# Patient Record
Sex: Female | Born: 1935
Health system: Southern US, Community
[De-identification: ages and names within clinical notes are randomized; demographics above are authoritative.]

## PROBLEM LIST (undated history)

## (undated) ENCOUNTER — Emergency Department (HOSPITAL_COMMUNITY): Admission: EM | Discharge status: Absent without leave | Payer: Medicare Other

## (undated) DIAGNOSIS — G8929 Other chronic pain: Secondary | ICD-10-CM

## (undated) DIAGNOSIS — R51 Headache: Secondary | ICD-10-CM

## (undated) DIAGNOSIS — M542 Cervicalgia: Secondary | ICD-10-CM

## (undated) DIAGNOSIS — I10 Essential (primary) hypertension: Secondary | ICD-10-CM

## (undated) DIAGNOSIS — K219 Gastro-esophageal reflux disease without esophagitis: Secondary | ICD-10-CM

## (undated) DIAGNOSIS — J189 Pneumonia, unspecified organism: Secondary | ICD-10-CM

## (undated) DIAGNOSIS — F419 Anxiety disorder, unspecified: Secondary | ICD-10-CM

## (undated) DIAGNOSIS — M24829 Other specific joint derangements of unspecified elbow, not elsewhere classified: Secondary | ICD-10-CM

## (undated) DIAGNOSIS — N390 Urinary tract infection, site not specified: Secondary | ICD-10-CM

## (undated) DIAGNOSIS — E039 Hypothyroidism, unspecified: Secondary | ICD-10-CM

## (undated) DIAGNOSIS — M810 Age-related osteoporosis without current pathological fracture: Secondary | ICD-10-CM

## (undated) DIAGNOSIS — M549 Dorsalgia, unspecified: Secondary | ICD-10-CM

## (undated) DIAGNOSIS — M1990 Unspecified osteoarthritis, unspecified site: Secondary | ICD-10-CM

## (undated) DIAGNOSIS — R519 Headache, unspecified: Secondary | ICD-10-CM

## (undated) DIAGNOSIS — R0602 Shortness of breath: Secondary | ICD-10-CM

## (undated) DIAGNOSIS — E785 Hyperlipidemia, unspecified: Secondary | ICD-10-CM

## (undated) HISTORY — PX: CATARACT EXTRACTION: SUR2

## (undated) HISTORY — PX: OTHER SURGICAL HISTORY: SHX169

## (undated) HISTORY — PX: ELBOW SURGERY: SHX618

## (undated) HISTORY — PX: ESOPHAGOGASTRODUODENOSCOPY: SHX1529

## (undated) HISTORY — PX: COLONOSCOPY: SHX174

## (undated) HISTORY — PX: TOTAL HIP ARTHROPLASTY: SHX124

---

## 1996-01-08 HISTORY — PX: JOINT REPLACEMENT: SHX530

## 1998-04-24 ENCOUNTER — Other Ambulatory Visit: Admission: RE | Admit: 1998-04-24 | Discharge: 1998-04-24 | Payer: Self-pay | Admitting: Cardiology

## 1998-10-22 ENCOUNTER — Emergency Department (HOSPITAL_COMMUNITY): Admission: EM | Admit: 1998-10-22 | Discharge: 1998-10-22 | Payer: Self-pay | Admitting: *Deleted

## 1998-12-28 ENCOUNTER — Encounter: Admission: RE | Admit: 1998-12-28 | Discharge: 1998-12-28 | Payer: Self-pay | Admitting: Rheumatology

## 1998-12-28 ENCOUNTER — Encounter: Payer: Self-pay | Admitting: Rheumatology

## 2000-01-11 ENCOUNTER — Inpatient Hospital Stay (HOSPITAL_COMMUNITY): Admission: EM | Admit: 2000-01-11 | Discharge: 2000-01-16 | Payer: Self-pay | Admitting: Emergency Medicine

## 2000-01-11 ENCOUNTER — Encounter: Payer: Self-pay | Admitting: Emergency Medicine

## 2000-02-12 ENCOUNTER — Encounter: Payer: Self-pay | Admitting: Internal Medicine

## 2000-02-12 ENCOUNTER — Encounter: Admission: RE | Admit: 2000-02-12 | Discharge: 2000-02-12 | Payer: Self-pay | Admitting: Internal Medicine

## 2000-06-06 ENCOUNTER — Encounter: Admission: RE | Admit: 2000-06-06 | Discharge: 2000-06-06 | Payer: Self-pay | Admitting: Internal Medicine

## 2000-06-06 ENCOUNTER — Encounter: Payer: Self-pay | Admitting: Internal Medicine

## 2000-06-09 ENCOUNTER — Encounter: Payer: Self-pay | Admitting: Internal Medicine

## 2000-06-09 ENCOUNTER — Encounter: Admission: RE | Admit: 2000-06-09 | Discharge: 2000-06-09 | Payer: Self-pay | Admitting: Internal Medicine

## 2000-06-15 ENCOUNTER — Emergency Department (HOSPITAL_COMMUNITY): Admission: EM | Admit: 2000-06-15 | Discharge: 2000-06-15 | Payer: Self-pay | Admitting: Emergency Medicine

## 2000-07-21 ENCOUNTER — Encounter: Payer: Self-pay | Admitting: Internal Medicine

## 2000-07-21 ENCOUNTER — Encounter: Admission: RE | Admit: 2000-07-21 | Discharge: 2000-07-21 | Payer: Self-pay | Admitting: Internal Medicine

## 2001-02-12 ENCOUNTER — Emergency Department (HOSPITAL_COMMUNITY): Admission: EM | Admit: 2001-02-12 | Discharge: 2001-02-12 | Payer: Self-pay | Admitting: Emergency Medicine

## 2001-02-12 ENCOUNTER — Encounter: Payer: Self-pay | Admitting: Emergency Medicine

## 2001-05-11 ENCOUNTER — Encounter: Payer: Self-pay | Admitting: Internal Medicine

## 2001-05-11 ENCOUNTER — Encounter: Admission: RE | Admit: 2001-05-11 | Discharge: 2001-05-11 | Payer: Self-pay | Admitting: Internal Medicine

## 2001-05-21 ENCOUNTER — Other Ambulatory Visit: Admission: RE | Admit: 2001-05-21 | Discharge: 2001-05-21 | Payer: Self-pay | Admitting: Obstetrics and Gynecology

## 2001-07-01 ENCOUNTER — Encounter (INDEPENDENT_AMBULATORY_CARE_PROVIDER_SITE_OTHER): Payer: Self-pay

## 2001-07-01 ENCOUNTER — Ambulatory Visit (HOSPITAL_COMMUNITY): Admission: RE | Admit: 2001-07-01 | Discharge: 2001-07-01 | Payer: Self-pay | Admitting: Gastroenterology

## 2002-11-03 ENCOUNTER — Encounter: Admission: RE | Admit: 2002-11-03 | Discharge: 2002-11-03 | Payer: Self-pay | Admitting: Internal Medicine

## 2003-02-11 ENCOUNTER — Inpatient Hospital Stay (HOSPITAL_COMMUNITY): Admission: EM | Admit: 2003-02-11 | Discharge: 2003-02-16 | Payer: Self-pay | Admitting: Emergency Medicine

## 2004-01-07 ENCOUNTER — Encounter: Admission: RE | Admit: 2004-01-07 | Discharge: 2004-01-07 | Payer: Self-pay | Admitting: Orthopedic Surgery

## 2005-03-13 ENCOUNTER — Encounter: Admission: RE | Admit: 2005-03-13 | Discharge: 2005-03-13 | Payer: Self-pay | Admitting: Orthopedic Surgery

## 2005-03-30 ENCOUNTER — Emergency Department (HOSPITAL_COMMUNITY): Admission: EM | Admit: 2005-03-30 | Discharge: 2005-03-30 | Payer: Self-pay | Admitting: Emergency Medicine

## 2005-05-08 ENCOUNTER — Ambulatory Visit: Payer: Self-pay | Admitting: Critical Care Medicine

## 2005-05-10 ENCOUNTER — Ambulatory Visit: Payer: Self-pay | Admitting: Critical Care Medicine

## 2005-05-23 ENCOUNTER — Inpatient Hospital Stay (HOSPITAL_COMMUNITY): Admission: RE | Admit: 2005-05-23 | Discharge: 2005-05-25 | Payer: Self-pay | Admitting: Orthopedic Surgery

## 2005-09-13 ENCOUNTER — Encounter: Admission: RE | Admit: 2005-09-13 | Discharge: 2005-09-13 | Payer: Self-pay | Admitting: Internal Medicine

## 2006-04-16 ENCOUNTER — Encounter: Admission: RE | Admit: 2006-04-16 | Discharge: 2006-04-16 | Payer: Self-pay | Admitting: Internal Medicine

## 2006-10-10 ENCOUNTER — Ambulatory Visit (HOSPITAL_COMMUNITY): Admission: RE | Admit: 2006-10-10 | Discharge: 2006-10-10 | Payer: Self-pay | Admitting: Obstetrics and Gynecology

## 2007-01-14 ENCOUNTER — Encounter (INDEPENDENT_AMBULATORY_CARE_PROVIDER_SITE_OTHER): Payer: Self-pay | Admitting: Internal Medicine

## 2007-01-14 ENCOUNTER — Observation Stay (HOSPITAL_COMMUNITY): Admission: EM | Admit: 2007-01-14 | Discharge: 2007-01-15 | Payer: Self-pay | Admitting: Emergency Medicine

## 2007-01-15 ENCOUNTER — Encounter (INDEPENDENT_AMBULATORY_CARE_PROVIDER_SITE_OTHER): Payer: Self-pay | Admitting: Internal Medicine

## 2007-10-13 ENCOUNTER — Encounter (INDEPENDENT_AMBULATORY_CARE_PROVIDER_SITE_OTHER): Payer: Self-pay | Admitting: Gastroenterology

## 2007-10-13 ENCOUNTER — Ambulatory Visit (HOSPITAL_COMMUNITY): Admission: RE | Admit: 2007-10-13 | Discharge: 2007-10-13 | Payer: Self-pay | Admitting: Gastroenterology

## 2007-11-25 ENCOUNTER — Emergency Department (HOSPITAL_COMMUNITY): Admission: EM | Admit: 2007-11-25 | Discharge: 2007-11-25 | Payer: Self-pay | Admitting: Emergency Medicine

## 2008-08-06 ENCOUNTER — Emergency Department (HOSPITAL_BASED_OUTPATIENT_CLINIC_OR_DEPARTMENT_OTHER): Admission: EM | Admit: 2008-08-06 | Discharge: 2008-08-06 | Payer: Self-pay | Admitting: Emergency Medicine

## 2009-06-06 ENCOUNTER — Encounter: Admission: RE | Admit: 2009-06-06 | Discharge: 2009-06-06 | Payer: Self-pay | Admitting: Internal Medicine

## 2009-10-20 ENCOUNTER — Emergency Department (HOSPITAL_BASED_OUTPATIENT_CLINIC_OR_DEPARTMENT_OTHER): Admission: EM | Admit: 2009-10-20 | Discharge: 2009-10-20 | Payer: Self-pay | Admitting: Emergency Medicine

## 2009-10-20 ENCOUNTER — Ambulatory Visit: Payer: Self-pay | Admitting: Diagnostic Radiology

## 2010-05-22 ENCOUNTER — Other Ambulatory Visit: Payer: Self-pay | Admitting: Dermatology

## 2010-05-22 NOTE — Op Note (Signed)
Jocelyn Bauer, Jocelyn Bauer               ACCOUNT NO.:  [id number]   MEDICAL RECORD NO.:  [id number]          PATIENT TYPE:  AMB   LOCATION:  ENDO                         FACILITY:  [facility]   PHYSICIAN:  John C. Madilyn Fireman, M.D.    DATE OF BIRTH:  24-Feb-1928   DATE OF PROCEDURE:  10/13/2007  DATE OF DISCHARGE:                               OPERATIVE REPORT   INDICATIONS FOR PROCEDURE:  Heme-positive stools.   PROCEDURE:  The patient was placed in the left lateral decubitus  position and was placed on the pulse monitor with continuous low flow  oxygen delivered by nasal cannula.  She was sedated with 50 mcg IV  fentanyl and 4 mg IV Versed in addition to the medication given for the  previous EGD.  The Olympus video colonoscope was inserted into the  rectum and advanced to the cecum, confirmed by transillumination at  McBurney's point and visualization of the ileocecal valve and  appendiceal orifice.  Prep is excellent.  The cecum, ascending,  transverse, descending, and sigmoid colon all appeared normal with no  masses, polyps, diverticula, or other mucosal abnormalities.  The rectum  likewise appeared normal, and retroflexed view of the anus revealed no  obvious internal hemorrhoids.  The scope was then withdrawn, and the  patient returned to the recovery room in stable condition.  She  tolerated the procedure well, and there were no immediate complications.   IMPRESSION:  Normal colonoscopy.   PLAN:  Repeat colonoscopy in 5-10 years in face of Hemoccult positivity,  I recommend repeating Hemoccult in five years.           ______________________________  [name], M.D.     JCH/MEDQ  D:  10/13/2007  T:  10/13/2007  Job:  [id number]   cc:   Dr. __________

## 2010-05-22 NOTE — Consult Note (Signed)
Jocelyn Bauer, Jocelyn Bauer               ACCOUNT NO.:  [id number]   MEDICAL RECORD NO.:  [id number]          PATIENT TYPE:  INP   LOCATION:  3731                         FACILITY:  [facility]   PHYSICIAN:  Casimiro Needle L. Reynolds, M.D.DATE OF BIRTH:  1928/11/18   DATE OF CONSULTATION:  01/14/2007  DATE OF DISCHARGE:                                 CONSULTATION   REASON FOR EVALUATION:  Transient ischemic attack.   HISTORY OF PRESENT ILLNESS:  This is the initial inpatient consultation  evaluation of this 77 year old woman with a past medical history which  includes hypertension, hyperlipidemia and rheumatoid arthritis.  The  patient reports that she went to bed last night feeling well, and  actually woke up around midnight and had no problems.  However, when she  awoke up around 5:30 a.m., she did not feel well.  She says that the  right side of her face felt funny, as if it were drawn.  There was as  little bit of a tingling sensation.  She also felt her speech was  somewhat slurred when she tried to talk to her daughter.  She checked  her blood pressure and it was elevated into the 170/95 range.  She took  aspirin and some medications, subsequently began to feel better.  She  was brought to emergency room for further evaluation, subsequently  admitted for TIA workup, a neurologic consultation is requested.  She  denies ever having any similar events to this before and is not aware of  any previous history of stroke.   PAST MEDICAL HISTORY:  1. Hypertension and hyperlipidemia for which she is on medications,      although she admits that she has difficulty taking statins because      they tend to make her joints hurt.  2. History of rheumatoid arthritis with several associated joint      replacement surgeries including elbow and hip replacements.  3. History of hypothyroidism for which she is on replacement.   Family/Social/Review of Systems per admission H&P and admission ER  records.   HOME MEDICATIONS:  Synthroid, folate, methotrexate, calcium with vitamin  D, Vicodin 2-3 a day, stool softener, Fosamax, Protonix, benazepril,  Pravachol, amlodipine.   PHYSICAL EXAMINATION:  VITAL SIGNS:  Temperature 99.7, blood pressure  146/71, pulse 66, respirations 16, O2 sat 98% on room air.  GENERAL:  This is a healthy-appearing woman, supine in the hospital bed,  in no evident distress.  HEENT:  Cranium is normocephalic, atraumatic.  Oropharynx benign.  NECK:  Supple without carotid or supraclavicular bruits.  HEART:  Regular rate and rhythm without murmurs.  NEUROLOGIC:  Mental status:  She is awake and alert.  She is fully  oriented to time, place and person.  Recent and remote memory are  intact.  Attention span, concentration and fund of knowledge are all  appropriate.  Speech is fluent and not dysarthric.  She is able to name  objects, but has difficulty repeating a complex phrase.  Cranial nerves:  Pupils are equal, reactive.  Extraocular movements are full without  nystagmus.  Visual fields are full  to confrontation.  Hearing is intact  to conversational speech.  Face, tongue and palate move normally and  symmetrically.  Facial sensation is intact pinprick.  Shoulder shrug is  normal.  Motor testing normal bulk and tone, normal strength in all  extremity muscles.  Sensation intact to light touch, pinprick and double  stimulation in all extremities.  Coordination:  Rapid movements were  performed accurately.  Finger-nose was performed accurately.  Gait:  She  arises slowly from a chair.  Her stance is normal.  She is able to  ambulate without difficulty.  Reflexes 2+ and symmetric.  Toes are  downgoing bilaterally.   LABORATORY DATA:  CBC white count 6.2, hemoglobin 13.2, platelets  378,000 with a normal differential.  Chemistries were unremarkable.  Creatinine 0.7.  Urinalysis remarkable for trace leukocytes without  evidence of infection.  CT of the head performed  earlier today is  personally reviewed.  The study shows some very mild atrophy with no  evidence of acute finding, and really no significant evidence of white  matter disease or other vascular disease.  There does appear to be some  calcification in the left vetebral artery near the foramen magnum.   IMPRESSION:  Left brain transient ischemic attack, probably small vessel  subcortical in character without evident clinical residual.  Initially  presenting with right face weakness/numbness and dysarthria.  Risk  factors include hypertension and hypercholesterolemia.   RECOMMENDATIONS:  I would proceed with a routine stroke workup,  including MRI, MRA, carotid and transcranial Dopplers, 2-D  echocardiogram, and stroke labs.  Agree with aspirin daily.  Stroke  service to follow.  If the workup is unremarkable, she maybe discharged  tomorrow on aspirin.      Michael L. Thad Ranger, M.D.  Electronically Signed     MLR/MEDQ  D:  01/14/2007  T:  01/14/2007  Job:  [id number]

## 2010-05-22 NOTE — Op Note (Signed)
Jocelyn Bauer, Jocelyn Bauer               ACCOUNT NO.:  [id number]   MEDICAL RECORD NO.:  [id number]          PATIENT TYPE:  AMB   LOCATION:  ENDO                         FACILITY:  [facility]   PHYSICIAN:  John C. Madilyn Fireman, M.D.    DATE OF BIRTH:  1928/09/12   DATE OF PROCEDURE:  10/13/2007  DATE OF DISCHARGE:                               OPERATIVE REPORT   PROCEDURE:  Esophagogastroduodenoscopy with biopsy.   INDICATIONS FOR PROCEDURE:  Heme-positive stools.   DESCRIPTION OF PROCEDURE:  The patient was placed in the left lateral  decubitus position and placed on the pulse monitor with continuous low-  flow oxygen delivered by nasal cannula.  She was sedated with 50 mcg of  IV fentanyl and 5 mg of IV Versed.  The Olympus video endoscope was  advanced under direct vision into the oropharynx and esophagus.  The  esophagus was straight and of normal caliber.  The squamocolumnar line  at 36 cm above a 2-cm hiatal hernia.  There was a possible very faint  widely patent lower esophageal ring seen only with the LES was fully  relaxed.  The stomach was entered and a small amount of liquid  secretions were suctioned from the fundus.  Retroflexed view of the  cardia confirmed a hiatal hernia and was otherwise unremarkable.  Within  the body of the stomach, there were seen a few scattered small polyps,  typical in appearance for benign fundic gland polyps.  Two of these were  sampled for histology.  The antrum showed some streaks of erythema with  mild granularity with no definite focal erosions.  A CLO-test was  obtained.  The pylorus was not deformed and easily allowed passage of  the endoscope tip into the duodenum.  Both the bulb and second were well  inspected and appeared be within normal limits.  The scope was then  withdrawn and the patient returned to the recovery room in stable  condition.  She tolerated the procedure well and there were no immediate  complications.   IMPRESSION:  1. Hiatal  hernia.  2. Gastric polyps.   PLAN:  Will await CLO-test and biopsy results.           ______________________________  [name], M.D.     JCH/MEDQ  D:  10/13/2007  T:  10/13/2007  Job:  [id number]   cc:   Dr. Thurmon Fair

## 2010-05-22 NOTE — H&P (Signed)
NAMEPAITYNN, MIKUS               ACCOUNT NO.:  [id number]   MEDICAL RECORD NO.:  [id number]          PATIENT TYPE:  INP   LOCATION:  3731                         FACILITY:  [facility]   PHYSICIAN:  Mark A. Perini, M.D.   DATE OF BIRTH:  Jan 27, 1928   DATE OF ADMISSION:  01/14/2007  DATE OF DISCHARGE:                              HISTORY & PHYSICAL   CHIEF COMPLAINT:  Right facial numbness.   HISTORY OF PRESENT ILLNESS:  Jocelyn Bauer is a pleasant 77 year old female  with a past medical history as listed below, who woke this morning and  noticed that she had a numbness across the right side of her face,  including her forehead and cheek.  She also felt as though her right  face was drooping and that she was having some trouble speaking.  Her  daughter was with her, and did feel as though she had some slurred  speech as well.  She presented to the emergency room.  Her symptoms  lasted for a total of approximately 30 minutes.  A cranial CT scan is  negative.  At home she checked her blood pressure and it was 170/100.  It has since come down to 140/70; however, given her symptoms, she will  be admitted for further evaluation.   PAST MEDICAL HISTORY:  1. Rheumatoid arthritis since 1974.  2. History of bilateral elbow replacements.  3. History of left hip replacement.  4. Hypothyroidism.  5. Appendectomy.  6. Her uterus has been tacked.  7. Thyroidectomy in 1968.  8. Mild gastroesophageal reflux disease.  9. Admitted for pneumonia in January 2002.  10.History of chronic low back pain.  11.Degenerative disk disease.  12.Cervical spine degenerative joint disease.  13.Osteoporosis.  14.Adrenal insufficiency in 2003, from long-term prednisone; however,      this has clinically resolved and she has not required long-term      steroids.  15.In May 2004, a simple cyst of the left ovary, which has required      some followup.  16.Uterine fibroid.  17.Hypertension since 2004.  18.Possible COBT  noted in 2007.  19.Hyperlipidemia.  20.She bruised her left hip after a fall in 2005.   ALLERGIES:  DILAUDID causes her to feel hot all over.  DIDRONEL caused  diarrhea.  IV DYE caused a rash.  MS CONTIN caused itching.  VYTORIN  caused achiness.   CURRENT MEDICATIONS:  1. Synthroid 100 mcg daily.  2. Folic acid 1 mg daily.  3. Methotrexate 2.5 mg, three pills weekly.  4. Calcium with D twice daily.  5. Vicodin, one pill up to three or four times daily.  6. Metamucil p.r.n.  7. Stool softener p.r.n.  8. Protonix 40 mg daily.  9. Fosamax 70 mg weekly.  10.Lotensin 20 mg daily.  11.Vitamin D 50,000 units twice weekly.  12.Amlodipine 2.5 mg daily.  13.Pravachol 40 mg each evening for the last few months; however, she      stopped the Pravachol one week ago, because she felt it was causing      her to ache more.   PHYSICAL EXAMINATION:  VITAL  SIGNS:  Temperature 99.7 degrees, pulse 66,  respirations 16.  She is in normal sinus rhythm on telemetry.  Blood  pressure 146/71.  GENERAL:  She is in no acute distress.  She is sitting on the side of  the bed.  She is alert and oriented x4.  She is normally conversant and  speech is fluent.  NEUROLOGIC:  Cranial nerves II-XII  are normal at this time.  LUNGS:  Clear to auscultation bilaterally.  There are no wheezes, rales  or rhonchi.  HEART:  A regular rate and rhythm, with no murmur, rub or gallop.  ABDOMEN:  Soft, nontender.  There is no edema.  EXTREMITIES:  She moves her extremities x4.   REVIEW OF SYSTEMS:  As per the history of present illness.  She has had  chronic back pain.  She denies any other stroke symptoms.  Specifically,  she denies blindness, diplopia or weakness of the extremities, which  localizes.   LABORATORY DATA:  A cranial CT scan and chest x-ray have been personally  reviewed and are negative.   White count 6.2 with a normal differential, hemoglobin 13.2, platelet  count 378,000.  Sodium 140, potassium 4,  chloride 109, CO2 of 25, BUN  12, creatinine 0.7, glucose 107.   ASSESSMENT/PLAN:  This 77 year old female with multiple stroke risk  factors, with a left brain transient ischemic attack:  Will admit.  Will  obtain a neurology consultation.  Will check an MRI/MRA of the brain, as  well as carotid Dopplers and an echocardiogram.  Will add a 325 mg  aspirin daily.  She is a full-code status.      Mark A. Perini, M.D.  Electronically Signed     MAP/MEDQ  D:  01/14/2007  T:  01/14/2007  Job:  [id number]

## 2010-05-22 NOTE — Discharge Summary (Signed)
NAMELUCILL, Jocelyn Bauer               ACCOUNT NO.:  [id number]   MEDICAL RECORD NO.:  [id number]          PATIENT TYPE:  INP   LOCATION:  3731                         FACILITY:  [facility]   PHYSICIAN:  Mark A. Perini, M.D.   DATE OF BIRTH:  [birth year]   DATE OF ADMISSION:  01/14/2007  DATE OF DISCHARGE:  01/15/2007                               DISCHARGE SUMMARY   DISCHARGE DIAGNOSES:  1. Left brain transient ischemic attack.  2. Rheumatoid arthritis.  3. Hypertension.  4. Hypothyroidism.  5. Gastroesophageal reflux.  6. Chronic low back pain with degenerative disk disease of the lumbar      and cervical spine and degenerative joint disease.  7. Osteoporosis.  8. Hyperlipidemia.  9. Hypertension.   PROCEDURES:  Neurology consultation.  Carotid Dopplers, which showed no  significant plaque bilaterally.  No significant internal carotid artery  stenosis.  Right vertebral artery was not visualized.  Left vertebral  artery flow was antegrade.  MRI of the brain and MRA showed no acute  pathology.  Echocardiogram 2-dimensional showed ejection fraction of 55%-  60% and mild hypokinesis of the mid inferior septal wall.  There was  abnormal left ventricular relaxation, but no source of cardiac embolism  was noted.  Transcranial Dopplers are pending at the time of this  discharge.   DISCHARGE MEDICATIONS:  Jocelyn Bauer will resume all of her same medications,  with the exception that she will take a noncoated 325-mg aspirin daily  henceforth.  Her other medications are:  1. Synthroid 100 mcg daily.  2. Folic acid 1 mg daily.  3. Methotrexate 2.5 mg 3 pills weekly.  4. Calcium with D twice a day.  5. Vicodin 1 pill up to 4 times daily as needed.  6. Metamucil as needed.  7. Stool softener as needed.  8. Protonix 40 mg daily with food.  9. Fosamax 70 mg weekly.  10.Lotensin 20 mg daily.  11.Vitamin D 50,000 units twice a week.  12.Amlodipine 2.5 mg daily.  13.Pravachol 40 mg each evening.   HISTORY OF PRESENT ILLNESS:  Jocelyn Bauer is a pleasant, 77 year old female  who presented with right facial numbness and some drooping of her right  face and some trouble speaking.  These symptoms lasted approximately 30  minutes.  She was admitted for further evaluation.   HOSPITAL COURSE:  Jocelyn Bauer was admitted to a telemetry bed.  She remained  in normal sinus rhythm.  She remained stable from a neurologic and  cardiovascular and pulmonary standpoint.  She underwent the diagnostic  studies as noted above.  She did not have any recurrence of stroke-type  symptoms.  By the evening of January 15, 2007, she was deemed stable for  discharge home.   DISCHARGE PHYSICAL EXAMINATION:  VITAL SIGNS:  Temperature 98.5,  afebrile, pulse 85, respiratory rate 16, blood pressure 116/67.  GENERAL:  She is in no acute distress.  NEURO:  Speech was fluent.  Cranial nerves were intact.  LUNGS:  Clear to auscultation bilaterally with no wheezes, rales, or  rhonchi.  HEART:  Regular rate and rhythm with no murmur, rub, or gallop.  EXTREMITIES:  There is no edema.   NOTABLE DISCHARGE LABORATORY DATA:  White count 5.2, hemoglobin 12.4,  platelet count 361,000.  Sodium 141, potassium 3.7, chloride 106, CO2 of  27, BUN 10, creatinine 0.75, glucose 101.  Total cholesterol 181,  triglycerides 83, HDL 55, and LDL 109.  Homocysteine was 10.6  micromoles/L, which is normal.  Hemoglobin A1c was 5.7.  Urine culture  showed 15,000 colonies of multiple bacterial morphotypes.   DISCHARGE INSTRUCTIONS:  Jocelyn Bauer is to follow a low-salt diet.  She is to  increase her activity slowly.  She is to call if she has any recurrent  problems.  She is to call our office for a followup visit in 1 month.  Given her possible subtle wall motion abnormalities seen on her 2-D  echocardiogram, we will consider a followup stress test of her heart for  possible future risk stratification, although she has had no cardiac-  type symptoms  recently.      Mark A. Perini, M.D.  Electronically Signed     MAP/MEDQ  D:  01/15/2007  T:  01/16/2007  Job:  [id number]

## 2010-05-25 NOTE — Discharge Summary (Signed)
[facility]  Patient:    Jocelyn Bauer, Jocelyn Bauer                      MRN: [id number] Adm. Date:  [id number] Disc. Date: [id number] Attending:  Beatris Ship CC:         Demetria Pore. Coral Spikes, M.D.   Discharge Summary  DISCHARGE DIAGNOSES:  1. Community-acquired pneumonia versus bacterial bronchitis in an     immunosuppressed patient.  2. Rheumatoid arthritis, on chronic steroids and methotrexate.  3. Constipation.  4. Hypothyroidism.  DISCHARGE MEDICATIONS:  1. Humibid L.A. 600 mg 2 tablets p.o. b.i.d.  2. Synthroid 100 mcg q.d. as before.  3. Folic acid 1 mg q.d.  4. Senokot-S 2 tablets q.h.s.  5. Pepcid 20 mg q.d. for the next two weeks, then stop.  6. Combivent inhaler 2 puffs q.6h. and p.r.n.  7. Prednisone taper.  8. Tequin 400 mg q.d. for six further days.  9. Tussionex 5-10 ml every eight hours p.r.n. 10. Darvocet-N 100 p.r.n. as before. 11. Methotrexate resume previous dose.  PROCEDURES:  None.  HISTORY OF PRESENT ILLNESS:  Ms. Jocelyn Bauer is a 77 year old female with a history of steroid-dependent rheumatoid arthritis, hypothyroidism, who presented with cough and shortness of breath.  Over the prior 48 hours she developed progressive cough with some purulent sputum.  Furthermore, she had some increasing shortness of breath and presented to the emergency room, where she was found to be hypoxic.  She denied fevers.  Did feel somewhat nauseated but had no vomiting.  She was admitted for further management.  HOSPITAL COURSE:  Ms. Jocelyn Bauer was thought to have an acute bronchitis or early possible left lower lobe pneumonia by exam although radiographically negative. She was placed on Tequin antibiotic therapy.  She also was placed on a nebulizer treatment.  Furthermore, she was given stress-dose steroids.  She developed some constipation which was treated with Senokot, Dulcolax, and enemas, with good results.  She showed somewhat slow improvement  but, gradually, by January 15, 2000, was feeling somewhat better and, by January 16, 2000, was deemed stable for discharge home.  DISCHARGE PHYSICAL EXAMINATION:  VITAL SIGNS:  Temperature 98.1, pulse 86, blood pressure 120/64, 91-95% saturations on room air.  GENERAL:  She was standing in the hall.  She was in no acute distress.  LUNGS:  Revealed some better air movement, but she still had bilateral rhonchi on exam.  HEART:  Regular rate and rhythm with no murmur, rub, or gallop.  ABDOMEN:  Soft and nontender.  No edema.  Blood cultures remained negative.  DISCHARGE LABORATORY DATA:  ABG on admission showed a pH of 7.42, pCO2 of 39, pO2 of 55, bicarbonate of 25, oxygen saturation of 89% on 2 L nasal cannula oxygen.  On January 14, 2000, white count was 13.7, hemoglobin 10.9, MCV was 89, platelet count 439,000.  On January 14, 2000, sodium was 139, potassium 4.2, chloride 105, CO2 28, glucose 218, BUN 23, creatinine 0.9, calcium 8.7. Cardiac enzymes were normal.  Blood cultures remained negative.  Sputum culture revealed normal oropharyngeal flora.  DISCHARGE INSTRUCTIONS:  She is to be up as tolerated.  She is to follow a regular diet.  She is to call if she has any recurrent problems.  FOLLOW-UP:  She is to call [phone] for a follow-up appointment in the next three weeks with Dr. Waynard Edwards.  She is to follow up as she would previously with Dr. Coral Spikes. DD:  01/18/00 TD:  01/19/00 Job: [id number]

## 2010-05-25 NOTE — Discharge Summary (Signed)
NAME:  Jocelyn Bauer, Jocelyn Bauer                         ACCOUNT NO.:  [id number]   MEDICAL RECORD NO.:  [id number]                   PATIENT TYPE:  INP   LOCATION:  0469                                 FACILITY:  [facility]   PHYSICIAN:  Jene Every, M.D.                 DATE OF BIRTH:  Apr 24, 1928   DATE OF ADMISSION:  02/11/2003  DATE OF DISCHARGE:  02/16/2003                                 DISCHARGE SUMMARY   ADMISSION DIAGNOSES INCLUDE:  1. Nondisplaced pubic rami fracture with question of periprosthetic fracture     on the left.  2. Rheumatoid arthritis.  3. Hypothyroidism.  4. Gastroesophageal reflux disease.  5. Degenerative disk disease of lumbar spine.  6. Osteoporosis.  7. Hypertension.   DISCHARGE DIAGNOSES:  1. Nondisplaced pubic rami fracture with question of periprosthetic fracture     on the left.  2. Rheumatoid arthritis.  3. Hypothyroidism.  4. Gastroesophageal reflux disease.  5. Degenerative disk disease of lumbar spine.  6. Osteoporosis.  7. Hypertension.   CONSULTATIONS:  1. PT/OT.  2. Dr. Waynard Edwards  3. Dr. Darrelyn Hillock   HISTORY:  Jocelyn Bauer is a 77 year old female who presented to the ER after a  fall at home where she hit her head as well as her hip.  She presented to  the ER with complaints of left-sided pain.  Initial x-rays revealed a  nondisplaced pubic rami fracture with questionable periprosthetic left hip  fracture, this implant was  placed there by Dr. Darrelyn Hillock many years ago.  Patient was subsequently admitted to the hospital for pain control as well  as further evaluation of the hip.  A bone scan was ordered to rule out  periprosthetic hip fracture.  Dr. Waynard Edwards was consulted to address medical  issues during the patient's hospital stay.   LABORATORY VALUES:  H&H was checked prior to admission with hemoglobin 12.7  and hematocrit 37.9; repeat H&H hemoglobin 12.2, hematocrit 36.9.  PT/INR  showed a PT of 12.3, INR 0.9.  Recent chemistries done prior to  admission  showed sodium 138, potassium 3.7, glucose 105.  TSH ordered during her  hospitalization shows a level of 4.9.  Blood type A positive.  EKG done at  the time of admission shows normal sinus rhythm with nonspecific ST and T  wave abnormalities which is slightly improved compared to the last study.  Films taken of the hip and pelvis show a fracture of the left inferior pubic  rami with questionable proximal focal femoral deficiency.  Patient had a  normal CT of the brain.  Chest x-ray showed no active disease.   HOSPITAL COURSE:  The patient was admitted for pain control issues.  She was  started on Lovenox for DVT prophylaxis.  Dr. Waynard Edwards as well as Dr. Darrelyn Hillock  was consulted.  Dr. Darrelyn Hillock has followed the patient in regards to the  questionable periprosthetic hip fracture.  Patient did well  throughout her  hospitalization.  Medical issues were managed by Dr. Waynard Edwards accordingly;  this was medicine adjustments.  Bone scan revealed no activity, ruled out  left periprosthetic fracture but there however, was questionable loosening.  The patient was doing well with PT/OT for gait training as well as  transfers.  Discharge planning was initiated.  IVs and Foleys were  discontinued, patient had __________ the fracture due to the fact __________  Lovenox was continued.  By day #5 of admission it was felt the patient was  doing well enough to be discharged home with home health arrangements made.   DISCHARGE INSTRUCTIONS:  PT/OT was ordered for home.  Patient will be  weightbearing as tolerated with a walker.  She will continue to use her TED  stockings for DVT prophylaxis as well as a daily aspirin.  Patient is to  make an appointment with Dr. Shelle Bauer in approximately 10-14 days for  evaluation as well as with Dr. Darrelyn Hillock for evaluation of her hip.  Appointment will also be made with Dr. Waynard Edwards for medical issues.  All home  meds were adjusted by Dr. Waynard Edwards at time of discharge.  Diet is  as  tolerated.  Condition on discharge stable.   FINAL DIAGNOSIS:  Doing well with a nondisplaced pubic rami fracture.     Jocelyn Bauer, P.A.                   Jene Every, M.D.    CS/MEDQ  D:  03/16/2003  T:  03/17/2003  Job:  [id number]

## 2010-05-25 NOTE — Procedures (Signed)
[facility]  Patient:    Jocelyn Bauer, Jocelyn Bauer Visit Number: [id number] MRN: [id number]          Service Type: END Location: ENDO Attending Physician:  Rich Brave Dictated by:   Florencia Reasons, M.D. Proc. Date: 07/01/01 Admit Date:  07/01/2001   CC:         Rodrigo Ran, M.D.  Richard M. Marcelle Overlie, M.D.   Procedure Report  PROCEDURE:  Colonoscopy with biopsies.  INDICATION:  A 77 year old female with rheumatoid arthritis and longstanding right lower quadrant abdominal pain which has gone on for many years, with a previous barium enema showing that the cecum is on a long mesentery.  More recently, she has been found to have an apparent uterine fibroid and a simple ovarian cyst, and apparently consideration is being given to possible hysterectomy by her gynecologist, Dr. Mancel Bale.  The patient is also in need of colon cancer screening and, more recently, has had a tendency toward some mild diarrhea which is a new development.  FINDINGS:  Normal exam to the cecum.  DESCRIPTION OF PROCEDURE:  The nature, purpose, and risks of the procedure had been discussed with the patient, who provided written consent.  Sedation was fentanyl 100 mcg and Versed 10 mg IV to a level of mild to moderate sedation. The large amount of medication in this somewhat elderly female is attributed to the fact that she does use Darvocet on a daily basis for control of her rheumatoid arthritis pain.  The Olympus adjustable-tension pediatric video colonoscope was advanced fairly easily around the colon to the cecum, identified by visualization of the appendiceal orifice.  I was not able to identify or enter the ileocecal valve, and there was not transillumination of the right lower quadrant, although the patient is somewhat obese which might account for that.  There was no evidence for further lumen, and I am quite confident of the cecal location.  During  pullback, I obtained random mucosal biopsies along the length of the colon to rule out microscopic colitis.  However, I did not see any evidence of inflammatory bowel disease or other forms of colitis, nor any polyps, cancer, vascular malformations, or diverticulosis.  Retroflexion in the rectum as well as reinspection of the rectosigmoid showed no additional findings.  The patient tolerated the procedure well, and there were no apparent complications.  IMPRESSION: 1. No source of chronic right lower quadrant pain identified.  Terminal ileum    not visualized. 2. No source of diarrhea identified.  Random biopsies pending. 3. Normal screening exam.  PLAN: 1. Consider follow-up colonoscopy in five years for ongoing screening which,    if negative, might indicate the need for no further subsequent exams or at    least subsequent exams spaced out to longer intervals. 2. Follow up per Dr. Marcelle Overlie with respect for possible consideration for    hysterectomy. 3. Await pathology on random biopsies for evaluation of diarrhea. 4. Although no ileal abnormalities were noted on the patients abdominal and    pelvic CT scan from a year ago, if greater definition of the terminal ileum    was desired to help exclude any cause of pain from ileitis, Meckels    diverticulum, strictures, etc., consideration could be given to a small    bowel series, although I tend to think the yield on this would be low. Dictated by:   Florencia Reasons, M.D. Attending Physician:  Rich Brave DD:  07/01/01 TD:  07/02/01  Job: [id number]

## 2010-05-25 NOTE — Consult Note (Signed)
NAME:  SALIHAH, PECKHAM                         ACCOUNT NO.:  [id number]   MEDICAL RECORD NO.:  [id number]                   PATIENT TYPE:  INP   LOCATION:  0469                                 FACILITY:  [facility]   PHYSICIAN:  Mark A. Perini, M.D.                DATE OF BIRTH:  February 14, 1928   DATE OF CONSULTATION:  02/12/2003  DATE OF DISCHARGE:                                   CONSULTATION   CHIEF COMPLAINT:  Hip pain.   HISTORY OF PRESENT ILLNESS:  Mrs. Jocelyn Bauer is a pleasant 77 year old female  with a past medical history as noted below.  She was in her usual state of  health until yesterday when she fell.  She tripped and there was no known  loss of consciousness.  She presented to the emergency room and was found to  have a left inferior pubic ramus fracture and there is some concern that she  may also have a left periprosthetic fracture of her femur.  She is admitted  for further evaluation and care.   PAST MEDICAL HISTORY:  1. Rheumatoid arthritis since 1974 currently under control.  2. History of bilateral elbow replacements.  3. History of left hip replacement.  4. Hypothyroidism.  5. History of appendectomy and a tacked uterus.  6. History of thyroidectomy in 1968.  7. History of mild gastroesophageal reflux disease.  8. History of community-acquired pneumonia in January 2002.  9. Mild degenerative disk disease and chronic low back pain.  10.      Osteoporosis.  11.      In February 2003 she was diagnosed with adrenal insufficiency from     long-term prednisone use.  She has been on a tremendously slow prednisone     taper and for the last 2 months she has been completely off prednisone     without any ill effects.  12.      In May 2003 she was diagnosed with a simple cyst of her left ovary     and uterine fibroids which were rechecked 6 months later and showed no     change.  13.      Mild hypertension 2004.   ALLERGIES:  DILAUDID which causes her to feel hot all over,  DIDRONEL which  caused diarrhea, and IV DYE which causes rashes.   CURRENT MEDICATIONS:  1. Synthroid 100 mcg once daily.  2. Folic acid 1 mg daily.  3. Methotrexate 2.5 mg three tablets per week.  4. Darvocet as needed.  5. Calcium with D daily.  6. Hydrocodone as needed.  7. Librax as needed.  8. Metamucil as needed.  9. Stool softener as needed.  10.      Protonix 40 mg once daily.  11.      Fosamax 70 mg weekly.  12.      Triamterene/hydrochlorothiazide 37.5/25 mg one half tablet daily.   SOCIAL HISTORY:  She  is married, her husband Ep have been married since  1949, she has four children, 10 grandchildren and 10 great grandchildren  with two more on the way, her husband Ep was also admitted today to the  hospital with an emphysema exacerbation.  She worked at [employer] but is currently  retired.  She has no definite tobacco history but did have a lot of second-  hand exposure via her husband in the past, no alcohol, no drug use history.  Father died at age 75 of prostate cancer, mother died at age 3 of an MI,  her first signs of coronary disease were at age 55, her mother also had  hypothyroidism.  She had two brothers (one older who is healthy, one younger  with emphysema), one sister died of lung cancer and coronary disease, one  older sister has chronic bronchitis.  Her four children are healthy.  She  has never had a stress test.   REVIEW OF SYSTEMS:  No chest pain, no shortness of breath, no edema, no  recent fevers, chills or night sweats, no recent weight gain or weight loss,  she denies any GI or GU symptomatology.   PHYSICAL EXAMINATION:  VITALS:  Temperature 97.2 (maximum temperature 99.2),  pulse 69, respiratory rate 20, blood pressure 114/64, 94% saturation on room  air, 1300 mL in, 1400 mL out.  GENERAL:  She is no acute distress.  LUNGS:  Clear to auscultation bilaterally with no wheezes, rales, or  rhonchi.  HEART:  Regular rate and rhythm with no murmur, rub, or  gallop.  ABDOMEN:  Soft and nontender, nondistended.  EXTREMITIES:  There is no peripheral edema.  She does have joint changes  consistent with rheumatoid arthritis noted on her hands.   LABORATORY DATA:  PTT 32, PT/INR 0.9.  White count is 8.6 with a normal  differential, hemoglobin 12.7, platelet count 431,000.  In August 2004 she  had a borderline lipid panel with an LDL of 149.  Currently sodium is 138,  potassium 3.7, chloride 102, CO2 29, BUN 16, creatinine 0.8, glucose 105,  calcium 8.4.  EKG shows normal sinus rhythm with some nonspecific  inferolateral ST wave inversion and flattening, this has not changed  compared to the last EKG in our office from May 2004.  Cranial CT scan  yesterday showed no active disease as did the chest x-ray.  She did have a  left inferior pubic ramus fracture by pelvic x-ray and some lytic changes in  her proximal and medial femur on the left, possibly suggesting a fracture.   ASSESSMENT AND PLAN:  [age]-year-old female with osteoporotic pelvic  fracture and possible left hip periprosthetic fracture.   1. Fracture.  Workup per orthopedics.  2. Cardiovascular.  Patient has two to three risk factors for     atherosclerotic coronary disease and a mildly abnormal baseline EKG.  Of     note the EKG is unchanged compared to 10 months ago.  She has never had a     stress test formally.  She does not exert herself exceptionally     vigorously in her usual lifestyle.  If she were to need a surgery I would     favor a cardiologist clearance prior to her surgery.  3. Osteoporosis.  I will address this more as an outpatient.  She may be a     [facility] candidate given that she has failed Fosamax therapy currently.  4. Adrenal insufficiency in the past.  If she were  to undergo a significant     procedure I would suggest brief stress-dosed steroids.  5. Hypothyroidism.  Continue home medications.  We will check a TSH. 6. Hypertension.  Stable.  7. Mrs. Granados  is a Full Code status.                                               Mark A. Waynard Edwards, M.D.    MAP/MEDQ  D:  02/12/2003  T:  02/12/2003  Job:  [id number]   cc:   Jene Every, M.D.  [address]  [place]  [place] [address]  Fax: [phone]   Demetria Pore. Coral Spikes, M.D.  [address]  [place]  [place] [address]  Fax: [phone]   Bernette Redbird, M.D.  [address] [place]  [place], [place] [address]  Fax: [phone]   Duke Salvia. Marcelle Overlie, M.D.  [address] [place]  [place] [address]  Fax: [phone]

## 2010-05-25 NOTE — H&P (Signed)
NAME:  Jocelyn, Bauer NO.:  [id number]   MEDICAL RECORD NO.:  [id number]                   PATIENT TYPE:   LOCATION:                                       FACILITY:   PHYSICIAN:  Jene Every, M.D.                 DATE OF BIRTH:   DATE OF ADMISSION:  02/11/2003  DATE OF DISCHARGE:                                HISTORY & PHYSICAL   CHIEF COMPLAINT:  Left hip pain.   HISTORY:  Jocelyn Bauer states she was at her home today when she tripped on  the porch, fell landing backwards. The patient had difficulty getting up  with immediate pain in her pelvic and hip area. She is brought by EMS to  [facility]. Dr. Shelle Iron was then consulted for pubic rami fracture  as well as questionable left periprosthetic hip fracture. Previous history  includes hypothyroidism, rheumatoid arthritis, hypertension. Previous  surgery includes total hip arthroplasty by Dr. Darrelyn Hillock with bilateral elbow  replacements by Dr. Darrelyn Hillock and Dr. Fannie Knee with degenerative disk disease of  the lumbar spine.   CURRENT MEDICATIONS:  1. Synthroid 100 mcg one p.o. daily.  2. Darvocet.  3. Vicodin p.r.n. pain.  4. Protonix one p.o. daily.  5. Methotrexate 2.5, three tablets on Monday.  6. Maxzide 25 mg one half p.o. daily.   SOCIAL HISTORY:  The patient's primary care physician is Dr. Waynard Edwards.   ALLERGIES:  DILAUDID.   PHYSICAL EXAMINATION:  GENERAL: The patient is a well-developed, well-  nourished female in mild distress. She is lying supine on the stretcher for  exam.  NECK: Supple. No lymphadenopathy.  CHEST: Clear to auscultation bilaterally.  HEART: Regular rate and rhythm without murmurs, rubs, or gallops.  ABDOMEN: Soft, nontender.  SKIN: No rash or lesions noted.  NEUROLOGIC: The patient is alert and oriented times three. She does note  some soreness in the basal aspect.  EXTREMITIES: The patient does have painful left hip range of motion. No  obvious deformity is  noted.   X-rays are reviewed which do show definite pubic rami fracture on the left  with questionable periprosthetic left hip fracture.   PLAN:  The patient will be admitted for observation, pain control. She will  be placed in [place] traction. A bone scan will be ordered of the left hip to  evaluate for periprosthetic fracture.  We will speak to Dr. Darrelyn Hillock in  regards to further treatment.     Jocelyn Bauer, P.A.                   Jene Every, M.D.    CS/MEDQ  D:  02/11/2003  T:  02/11/2003  Job:  [id number]

## 2010-05-25 NOTE — H&P (Signed)
[facility]  Patient:    Jocelyn Bauer, Jocelyn Bauer                      MRN: [id number] Adm. Date:  [id number] Attending:  Tobey Bride                         History and Physical  CHIEF COMPLAINT:  Cough and shortness of breath.  HISTORY OF PRESENT ILLNESS:  Jocelyn Bauer is a 77 year old white female with steroid-dependent rheumatoid arthritis, hypothyroidism, presenting at this time with cough and shortness of breath.  She evidently over the last 24-48 hours, developed progressive cough with purulent sputum.  She has in the last several hours developed progressive shortness of breath, resulting in presentation during the wee morning hours to the emergency room where she was found to be hypoxemic.  She has had significant purulent sputum and has not been febrile.  She denies any vomiting but has been mildly nauseated.  She has had no rash, diarrhea, headache, nor has she had any neurologic changes.  She denies any lower extremity swelling or calf pain.  She denies any chest pain. She does relate one similar episode five years ago at which point she had pneumonia.  PAST MEDICAL HISTORY:  The patient is intolerant to DILAUDID.  MEDICATIONS: 1. Methotrexate. 2. Prednisone 5 mg daily. 3. Synthroid 0.1 p.o. q.d. 4. Folate 1 daily. 5. Darvocet-N 100 1-2 daily.  MEDICAL ILLNESSES:  Rheumatoid arthritis, followed by Dr. Coral Spikes and surgically-induced hypothyroidism.  PAST SURGICAL HISTORY: 1. Bilateral elbow replacement. 2. Left total hip replacement. 3. Thyroidectomy for goiter.  FAMILY HISTORY:  Noncontributory.  SOCIAL HISTORY:  The patient is married with four children.  She does not smoke or drink.  PHYSICAL EXAMINATION:  VITAL SIGNS:  Temperature 98.6, blood pressure 160/60, pulse 100 and regular, respiratory rate 26, minimal labor with speaking.  The patient is dyspneic and coughing when speaking but peaceful at rest in no distress.  Skin  is nondiaphoretic, good turgor.  HEENT:  Anicteric.  Pupils are round and reactive.  Oropharynx benign.  NECK:  Supple.  Thyroid scar present.  No JVD or bruits.  LUNGS:  Wheezing at the right base.  Rhonchi and rales over the left lower lung fields.  Upper lung fields are clear.  Good breath sounds bilaterally.  CARDIOVASCULAR:  Regular rate and rhythm.  No murmur, gallop, rub, or heave.  BACK:  No CVA or spinal tenderness.  ABDOMEN:  Protuberant, soft, nontender, no hepatosplenomegaly.  EXTREMITIES:  No clubbing, cyanosis, or edema.  Calves are unremarkable. Extremities are warm distally, good coloring, pulses 2+.  NEUROLOGIC:  Nonfocal, nonlateralizing.  She is pleasant and conversant.  CHEST X-RAY:  No acute disease.  LABORATORY DATA:  A CK was 66, troponin .01.  Blood gas - pH 7.42, PCO2 39, PO2 54.9.  CBC - hemoglobin 12.3, hematocrit 35.5, white blood cell count 9.9.  An EKG with normal sinus rhythm with some nonspecific ST and T changes.  No old one for comparison.  ASSESSMENT: 1. Acute bronchitis versus early left lower lobe pneumonia with the latter    evident on clinical exam but radiographically negative at this time.    Clearly accompanied by hypoxemia. 2. Steroid-dependent rheumatoid arthritis. 3. Hypothyroidism.  PLAN:  The patient will be treated empirically with antibiotics, oxygen, bronchodilators.  She will receive stress doses of steroids given her prolonged steroid utilization and probably pituitary axis suppression.  DD:  01/11/00 TD:  01/11/00 Job: [id number]

## 2010-05-25 NOTE — Op Note (Signed)
Jocelyn, Bauer NO.:  [id number]   MEDICAL RECORD NO.:  [id number]          PATIENT TYPE:  INP   LOCATION:  5023                         FACILITY:  [facility]   PHYSICIAN:  Dionne Ano. Gramig III, M.D.DATE OF BIRTH:  08/27/1928   DATE OF PROCEDURE:  05/23/2005  DATE OF DISCHARGE:                                 OPERATIVE REPORT   PREOPERATIVE DIAGNOSIS:  Failed right total elbow arthroplasty.   POSTOPERATIVE DIAGNOSIS:  Failed right total elbow arthroplasty.   OPERATION PERFORMED:  1.  Revision right total elbow arthroplasty with polyethylene and hinge      fixation exchange.  2.  Major synovectomy, right elbow.  3.  Ulnar nerve neurolysis and decompression with anterior transposition,      right elbow.   SURGEON:  Dionne Ano. Amanda Pea, M.D.   ASSISTANT:  Karie Chimera, P.A.-C.   ANESTHESIA:  General.   COMPLICATIONS:  None.   ESTIMATED BLOOD LOSS:  Less than 200 mL.   DRAINS:  One.   INDICATIONS FOR PROCEDURE:  The patient is a 77 year old female who presents  with the above mentioned diagnosis.  I have counseled the patient in regard  to risks and benefits of surgery.  She has filed conservative management,  has a loose bushing and screw and significant wear about the polyethylene  regions.  Her ulna and humeral components appear to be well-fixed.  I have  discussed with her the risks and benefits of surgery, possible scenarios and  possible treatment outcomes.  We spent a great deal of time going over the  prior implants.  This was a Stryker implant placed in 1992.  The patient has  a special set shipped in for the revision procedure.  She understands the  risks and benefits and she desires to proceed.   DESCRIPTION OF PROCEDURE:  The patient was seen by myself and anesthesia and  taken to the operating suite, underwent smooth induction of anesthesia.  She  was laid in a sloppy lateral position, bean bag was insufflated and she then  underwent a  thorough prep and drape about the right upper extremity.  All  body parts were well padded and secured.   Once sterile field was secured, the operation commenced with a posterior  medial incision.  Skin flap was elevated off the fascia and moved out of  harm's way.  Following this, I identified the ulnar nerve proximally and  performed a neurolysis.  The ulnar nerve was significantly adhered and stuck  in scar tissue.  It was meticulously dissected and 4.0 loupe magnification  and was actually in a resting state below the anterior medial condyle  region.  I released the medial intermuscular septum and excised this.  In  addition to this, I created an anterior transposition placement for the  nerve.  The nerve was very compressed, hyperemic and flattened.  We very  gently meticulously dissected this out without complicating feature.  She  tolerated this fairly well.  On the nerve was completely dissected, taking  care to preserve its epineurial plexus and vessels I then moved this into  the subcutaneous position with the vessel loop around it to prevent injury.  I should note that the arcade of Struthers medial intermuscular septum, two  heads of the FCU both superficial and deep were released.  The cubital  tunnel was previously released with her prior surgical intervention.  Once  this was done a triceps turn down was created.  The triceps was taken just  off the bony architecture.  Once the triceps  was turned down, I was then  able to perform a complete synovectomy of the joint.  The patient had a lot  of wear debris and poor polyethylene wear.  The bushings about the humeral  and ulnar components were removed and the prior screw which had loosened and  backed out was removed as well.  The patient tolerated the synovectomy well.  Following this, I irrigated copiously and made a bur hole laterally to  remove the prior loose screw.  It was removed.  This allowed for reassembly  of the  hinge device.  Following this, I then tested the ulnar and humeral  components.  The patient had excellent stability, no loosening of the ulnar  and humeral components and thus I performed a polyethylene exchange about  the ulna component and the humeral component and replaced the hinge screw  from lateral to medial.  A specialized device to prevent backout was placed  on the medial portion to engage the screw which transfixed the hinge of the  ulna and humerus. This secured itself nicely.  Following this, we then  placed a [place] suture of #2 FiberWire through the triceps tendon and  reattached the triceps tendon through drill holes.  The fascia was then  closed and the ulnar nerve was very gently placed in a transposed state.  It  was nicely secured in the transposed state by tacking down the subcu with  Vicryl.  I then placed a 1/8 inch Hemovac drain in, closed the wound with 3-  0 Vicryl followed by staple gun.  0.25% Marcaine with epinephrine was placed  in the wound for postoperative analgesia and the drain was hooked up to  suction.  Tourniquet time was less than 90 minutes (it was 70 minutes  approximately ).  The patient tolerated this well.  Once this was done I  then performed placement of a plaster slab and a large fluff dressing.  The  patient tolerated the procedure well, had excellent radial pulse, no signs  of compartment syndrome or other problems and she was taken to recovery  room.  She will be monitored in the recovery room and will begin antibiotics  immediately, pain medicine according to her needs etc.  All questions have  been encouraged and answered.           ______________________________  Dionne Ano. Everlene Other, M.D.     Nash Mantis  D:  05/23/2005  T:  05/24/2005  Job:  [id number]

## 2010-06-28 ENCOUNTER — Other Ambulatory Visit: Payer: Self-pay | Admitting: Obstetrics and Gynecology

## 2010-06-28 DIAGNOSIS — Z1231 Encounter for screening mammogram for malignant neoplasm of breast: Secondary | ICD-10-CM

## 2010-07-27 ENCOUNTER — Ambulatory Visit
Admission: RE | Admit: 2010-07-27 | Discharge: 2010-07-27 | Disposition: A | Discharge status: Home / Self Care | Payer: PRIVATE HEALTH INSURANCE | Source: Ambulatory Visit | Attending: Obstetrics and Gynecology | Admitting: Obstetrics and Gynecology

## 2010-07-27 DIAGNOSIS — Z1231 Encounter for screening mammogram for malignant neoplasm of breast: Secondary | ICD-10-CM

## 2010-09-27 LAB — CBC
Hemoglobin: 12.4
MCHC: 33.5
MCV: 93.5
Platelets: 361
Platelets: 378
RDW: 15
RDW: 15.1
WBC: 5.2
WBC: 6.2

## 2010-09-27 LAB — HOMOCYSTEINE: Homocysteine: 10.6

## 2010-09-27 LAB — I-STAT 8, (EC8 V) (CONVERTED LAB)
Bicarbonate: 25.1 — ABNORMAL HIGH
Hemoglobin: 14.6
Operator id: 198171
Sodium: 140
TCO2: 26

## 2010-09-27 LAB — URINE CULTURE: Colony Count: 15000

## 2010-09-27 LAB — URINALYSIS, ROUTINE W REFLEX MICROSCOPIC
Bilirubin Urine: NEGATIVE
Ketones, ur: NEGATIVE
Nitrite: NEGATIVE
pH: 7

## 2010-09-27 LAB — BASIC METABOLIC PANEL
Calcium: 8.8
GFR calc non Af Amer: >60
Glucose, Bld: 101 — ABNORMAL HIGH
Sodium: 141

## 2010-09-27 LAB — POCT I-STAT CREATININE
Creatinine, Ser: 0.7
Operator id: 198171

## 2010-09-27 LAB — DIFFERENTIAL
Basophils Absolute: 0
Basophils Relative: 1
Eosinophils Absolute: 0.1
Neutrophils Relative %: 71

## 2010-09-27 LAB — LIPID PANEL: Triglycerides: 83

## 2010-09-27 LAB — URINE MICROSCOPIC-ADD ON

## 2010-09-27 LAB — HEMOGLOBIN A1C: Mean Plasma Glucose: 126

## 2010-10-08 LAB — CLOTEST (H. PYLORI), BIOPSY: Helicobacter screen: NEGATIVE

## 2010-10-09 LAB — URINALYSIS, ROUTINE W REFLEX MICROSCOPIC
Nitrite: NEGATIVE
Specific Gravity, Urine: 1.01
pH: 7.5

## 2010-10-09 LAB — CBC
MCHC: 32.9
MCV: 95.1
Platelets: 374

## 2010-10-09 LAB — URINE CULTURE: Colony Count: 1000

## 2010-10-09 LAB — DIFFERENTIAL
Eosinophils Relative: 1
Lymphocytes Relative: 24
Lymphs Abs: 1.2

## 2010-10-09 LAB — COMPREHENSIVE METABOLIC PANEL
AST: 27
Albumin: 3.7
Calcium: 9
Creatinine, Ser: 0.73
GFR calc Af Amer: >60
GFR calc non Af Amer: >60

## 2011-01-24 DIAGNOSIS — M545 Low back pain: Secondary | ICD-10-CM | POA: Diagnosis not present

## 2011-01-24 DIAGNOSIS — Z79899 Other long term (current) drug therapy: Secondary | ICD-10-CM | POA: Diagnosis not present

## 2011-01-24 DIAGNOSIS — M542 Cervicalgia: Secondary | ICD-10-CM | POA: Diagnosis not present

## 2011-01-24 DIAGNOSIS — M069 Rheumatoid arthritis, unspecified: Secondary | ICD-10-CM | POA: Diagnosis not present

## 2011-01-24 DIAGNOSIS — M2550 Pain in unspecified joint: Secondary | ICD-10-CM | POA: Diagnosis not present

## 2011-01-24 DIAGNOSIS — M810 Age-related osteoporosis without current pathological fracture: Secondary | ICD-10-CM | POA: Diagnosis not present

## 2011-01-29 DIAGNOSIS — T84039A Mechanical loosening of unspecified internal prosthetic joint, initial encounter: Secondary | ICD-10-CM | POA: Diagnosis not present

## 2011-01-30 DIAGNOSIS — T84039A Mechanical loosening of unspecified internal prosthetic joint, initial encounter: Secondary | ICD-10-CM | POA: Diagnosis not present

## 2011-02-07 DIAGNOSIS — T84039A Mechanical loosening of unspecified internal prosthetic joint, initial encounter: Secondary | ICD-10-CM | POA: Diagnosis not present

## 2011-02-08 ENCOUNTER — Other Ambulatory Visit (HOSPITAL_COMMUNITY): Payer: Self-pay | Admitting: Orthopedic Surgery

## 2011-02-08 DIAGNOSIS — M25552 Pain in left hip: Secondary | ICD-10-CM

## 2011-02-20 ENCOUNTER — Encounter (HOSPITAL_COMMUNITY)
Admission: RE | Admit: 2011-02-20 | Discharge: 2011-02-20 | Disposition: A | Discharge status: Home / Self Care | Payer: Medicare Other | Source: Ambulatory Visit | Attending: Orthopedic Surgery | Admitting: Orthopedic Surgery

## 2011-02-20 DIAGNOSIS — M25559 Pain in unspecified hip: Secondary | ICD-10-CM | POA: Diagnosis not present

## 2011-02-20 DIAGNOSIS — Z96649 Presence of unspecified artificial hip joint: Secondary | ICD-10-CM | POA: Diagnosis not present

## 2011-02-20 DIAGNOSIS — M25552 Pain in left hip: Secondary | ICD-10-CM

## 2011-02-20 MED ORDER — TECHNETIUM TC 99M MEDRONATE IV KIT
23.6000 | PACK | Freq: Once | INTRAVENOUS | Status: AC | PRN
  Administered 2011-02-20: 23.6 via INTRAVENOUS

## 2011-02-21 DIAGNOSIS — R0609 Other forms of dyspnea: Secondary | ICD-10-CM | POA: Diagnosis not present

## 2011-02-21 DIAGNOSIS — E7800 Pure hypercholesterolemia, unspecified: Secondary | ICD-10-CM | POA: Diagnosis not present

## 2011-02-21 DIAGNOSIS — Z01810 Encounter for preprocedural cardiovascular examination: Secondary | ICD-10-CM | POA: Diagnosis not present

## 2011-02-21 DIAGNOSIS — I10 Essential (primary) hypertension: Secondary | ICD-10-CM | POA: Diagnosis not present

## 2011-02-22 DIAGNOSIS — R0609 Other forms of dyspnea: Secondary | ICD-10-CM | POA: Diagnosis not present

## 2011-02-22 DIAGNOSIS — R0989 Other specified symptoms and signs involving the circulatory and respiratory systems: Secondary | ICD-10-CM | POA: Diagnosis not present

## 2011-02-22 DIAGNOSIS — Z01810 Encounter for preprocedural cardiovascular examination: Secondary | ICD-10-CM | POA: Diagnosis not present

## 2011-03-05 ENCOUNTER — Encounter (HOSPITAL_COMMUNITY): Payer: Self-pay

## 2011-03-06 ENCOUNTER — Encounter (HOSPITAL_COMMUNITY): Payer: Self-pay

## 2011-03-06 ENCOUNTER — Encounter (HOSPITAL_COMMUNITY)
Admission: RE | Admit: 2011-03-06 | Discharge: 2011-03-06 | Disposition: A | Discharge status: Home / Self Care | Payer: Medicare Other | Source: Ambulatory Visit | Attending: Orthopedic Surgery | Admitting: Orthopedic Surgery

## 2011-03-06 ENCOUNTER — Ambulatory Visit (HOSPITAL_COMMUNITY)
Admission: RE | Admit: 2011-03-06 | Discharge: 2011-03-06 | Disposition: A | Discharge status: Home / Self Care | Payer: Medicare Other | Source: Ambulatory Visit | Attending: Orthopedic Surgery | Admitting: Orthopedic Surgery

## 2011-03-06 DIAGNOSIS — T84099A Other mechanical complication of unspecified internal joint prosthesis, initial encounter: Secondary | ICD-10-CM | POA: Diagnosis not present

## 2011-03-06 DIAGNOSIS — Z01818 Encounter for other preprocedural examination: Secondary | ICD-10-CM | POA: Insufficient documentation

## 2011-03-06 DIAGNOSIS — T84039A Mechanical loosening of unspecified internal prosthetic joint, initial encounter: Secondary | ICD-10-CM | POA: Diagnosis not present

## 2011-03-06 DIAGNOSIS — E039 Hypothyroidism, unspecified: Secondary | ICD-10-CM | POA: Diagnosis not present

## 2011-03-06 DIAGNOSIS — Z96649 Presence of unspecified artificial hip joint: Secondary | ICD-10-CM | POA: Diagnosis not present

## 2011-03-06 DIAGNOSIS — Z01812 Encounter for preprocedural laboratory examination: Secondary | ICD-10-CM | POA: Insufficient documentation

## 2011-03-06 DIAGNOSIS — R0602 Shortness of breath: Secondary | ICD-10-CM | POA: Diagnosis not present

## 2011-03-06 DIAGNOSIS — Y831 Surgical operation with implant of artificial internal device as the cause of abnormal reaction of the patient, or of later complication, without mention of misadventure at the time of the procedure: Secondary | ICD-10-CM | POA: Insufficient documentation

## 2011-03-06 DIAGNOSIS — I10 Essential (primary) hypertension: Secondary | ICD-10-CM | POA: Diagnosis not present

## 2011-03-06 DIAGNOSIS — Z01811 Encounter for preprocedural respiratory examination: Secondary | ICD-10-CM | POA: Diagnosis not present

## 2011-03-06 DIAGNOSIS — K219 Gastro-esophageal reflux disease without esophagitis: Secondary | ICD-10-CM | POA: Insufficient documentation

## 2011-03-06 HISTORY — DX: Shortness of breath: R06.02

## 2011-03-06 HISTORY — DX: Unspecified osteoarthritis, unspecified site: M19.90

## 2011-03-06 HISTORY — DX: Essential (primary) hypertension: I10

## 2011-03-06 HISTORY — DX: Gastro-esophageal reflux disease without esophagitis: K21.9

## 2011-03-06 HISTORY — DX: Hypothyroidism, unspecified: E03.9

## 2011-03-06 HISTORY — DX: Anxiety disorder, unspecified: F41.9

## 2011-03-06 HISTORY — DX: Hyperlipidemia, unspecified: E78.5

## 2011-03-06 LAB — URINE MICROSCOPIC-ADD ON

## 2011-03-06 LAB — CBC
HCT: 38.9 % (ref 36.0–46.0)
MCHC: 31.6 g/dL (ref 30.0–36.0)
MCV: 91.1 fL (ref 78.0–100.0)
Platelets: 508 10*3/uL — ABNORMAL HIGH (ref 150–400)
RDW: 15.8 % — ABNORMAL HIGH (ref 11.5–15.5)
WBC: 9.1 10*3/uL (ref 4.0–10.5)

## 2011-03-06 LAB — BASIC METABOLIC PANEL
BUN: 15 mg/dL (ref 6–23)
CO2: 28 mEq/L (ref 19–32)
Calcium: 9.5 mg/dL (ref 8.4–10.5)
Chloride: 100 mEq/L (ref 96–112)
Creatinine, Ser: 0.68 mg/dL (ref 0.50–1.10)

## 2011-03-06 LAB — DIFFERENTIAL
Basophils Absolute: 0 10*3/uL (ref 0.0–0.1)
Basophils Relative: 0 % (ref 0–1)
Eosinophils Absolute: 0 10*3/uL (ref 0.0–0.7)
Eosinophils Relative: 0 % (ref 0–5)
Monocytes Absolute: 0.4 10*3/uL (ref 0.1–1.0)

## 2011-03-06 LAB — URINALYSIS, ROUTINE W REFLEX MICROSCOPIC
Bilirubin Urine: NEGATIVE
Glucose, UA: 100 mg/dL — AB
Hgb urine dipstick: NEGATIVE
Specific Gravity, Urine: 1.016 (ref 1.005–1.030)
Urobilinogen, UA: 1 mg/dL (ref 0.0–1.0)
pH: 6.5 (ref 5.0–8.0)

## 2011-03-06 LAB — ABO/RH: ABO/RH(D): A POS

## 2011-03-06 LAB — PROTIME-INR: Prothrombin Time: 13 seconds (ref 11.6–15.2)

## 2011-03-06 LAB — SURGICAL PCR SCREEN: Staphylococcus aureus: NEGATIVE

## 2011-03-06 LAB — APTT: aPTT: 31 seconds (ref 24–37)

## 2011-03-06 NOTE — Patient Instructions (Addendum)
20 Jocelyn Bauer  03/06/2011   Your procedure is scheduled on:  Monday 3/4  AT 12:20 PM  Report to [facility] at  9:45  AM.  Call this number if you have problems the morning of surgery: [phone]   Remember:   Do not eat food OR DRINK ANYTHING AFTER MIDNIGHT THE NIGHT BEFORE YOUR SURGERY.    Take these medicines the morning of surgery with A SIP OF WATER: AMLODIPINE, LEVOTHYROXINE, PREDNISONE, PROTONIX   Do not wear jewelry, make-up or nail polish.  Do not wear lotions, powders, or perfumes.     Do not bring valuables to the hospital.  Contacts, dentures or bridgework may not be worn into surgery.  Leave suitcase in the car. After surgery it may be brought to your room.  For patients admitted to the hospital, checkout time is 11:00 AM the day of discharge.   Patients discharged the day of surgery will not be allowed to drive home.    Special Instructions: CHG Shower Use Special Wash: 1/2 bottle night before surgery and 1/2 bottle morning of surgery.   Please read over the following fact sheets that you were given: Blood Transfusion Information and MRSA Information AND INCENTIVE SPIROMETER INSTRUCTIONS

## 2011-03-06 NOTE — Progress Notes (Signed)
H&P Dictated. Dictation # [id number]  

## 2011-03-06 NOTE — Pre-Procedure Instructions (Signed)
PT HAS NOTE OF MEDICAL CLEARANCE FROM DR. PERINI AND CARDIAC CLEARANCE FROM DR. Jacinto Halim. EKG 02/21/11 WITH CARDIOLOGY OFFICE NOTES AND NUCLEAR STRESS TEST REPORT 02/22/11 ON THIS CHART FROM DR. Jacinto Halim. CXR WAS DONE TODAY PREOP AS PER ANESTHESIOLOGIST'S GUIDELINES

## 2011-03-07 NOTE — H&P (Signed)
NAMEJESSICCA, STITZER NO.:  [id number]  MEDICAL RECORD NO.:  [id number]  LOCATION:                               FACILITY:  [facility]  PHYSICIAN:  Madlyn Frankel. Charlann Boxer, M.D.  DATE OF BIRTH:  10-31-1928  DATE OF ADMISSION:  03/11/2011 DATE OF DISCHARGE:                             HISTORY & PHYSICAL   ANTICIPATED DATE OF SURGERY:  March 11, 2011  ADMITTING DIAGNOSIS:  Loose total hip components, left hip.  SURGERY:  Revision of left total hip.  HISTORY OF PRESENT ILLNESS:  This is an 78 year old lady with a history of total hip arthroplasty on the right with pain and difficulty.  Workup including bone scan showed signs compatible with loosening of the prosthesis as well as a poly wear.  After discussion of treatments, benefits, risks, options, the patient is now scheduled for revision of both components, total hip of the left hip.  The surgery risks, benefits, and aftercare were discussed in detail with the patient. Questions invited and answered.  Note that, her medical doctor is Dr. Waynard Edwards, her rheumatologist is Dr. Nickola Major.  She stopped her  methotrexate 2 weeks before surgery and will not start until 2 weeks after.  She is a candidate for tranexamic acid and will receive that at surgery.  Also, she is on chronic Coumadin therapy, so she will need steroid boost pre and postop.  She is planning on going to a nursing facility [facility] in [place] and is not given her home medications.  ALLERGIES:  Drug allergies to DILAUDID, which makes her feel hot and get a rash and NONIONIC OMNIPAQUE X-RAY DYE with a rash.  CURRENT MEDICATIONS: 1. Methotrexate 6 tablets once a week. 2. Amlodipine 5 mg once a day. 3. Benazepril 20 mg once a day. 4. Prednisone 5 mg once a day. 5. Folic acid. 6. Vitamin D. 7. Calcium daily. 8. Synthroid 0.5 mg once a day.  MEDICAL ILLNESSES:  Rheumatoid arthritis, hypertension, hypothyroidism.  PREVIOUS SURGERIES:   Bilateral total elbows, total hip arthroplasty on the left, partial thyroidectomy.  FAMILY HISTORY:  Positive for cancer and coronary artery disease.  SOCIAL HISTORY:  The patient is widowed.  She lives alone at home.  She does not smoke and does not drink.  REVIEW OF SYSTEMS:  CENTRAL NERVOUS SYSTEM:  Negative for headache, blurred vision, or dizziness.  PULMONARY:  Positive for history of bronchitis and pneumonia.  Negative for shortness of breath, PND, and orthopnea.  CARDIOVASCULAR:  Positive for hypertension.  Negative for chest pain or palpitation.  GI:  Positive for reflux and hiatal hernia and a history of irritable bowel.  Negative for ulcers or hepatitis. GU:  Positive for history of urinary tract infection.  MUSCULOSKELETAL: Positive in HPI.  PHYSICAL EXAMINATION:  VITAL SIGNS:  Blood pressure 129/77, respirations 14, pulse 80 and regular. GENERAL APPEARANCE:  A well-developed, well-nourished lady, in no acute distress. HEENT:  Head, normocephalic.  Nose patent.  Ears patent.  Pupils are equal, round, reactive to light.  Throat without injection. NECK:  Supple without adenopathy.  Carotids 2+ without bruit. CHEST:  Clear to auscultation.  No rales or rhonchi.  Respirations 14.  HEART:  Regular rate and rhythm at 80 beats per minute without murmur. ABDOMEN:  Soft with active bowel sounds.  No masses or organomegaly. NEUROLOGIC:  The patient is alert and oriented to time, place, person. Cranial nerves 2-12 grossly intact. EXTREMITIES:  The left hip with decreased range of motion with pain. Dorsalis pedis and posterior tibialis pulses are 2+.  IMAGING STUDIES:  Bone scan did show findings compatible with loosening of the prosthesis.  ASSESSMENT:  Status post total hip arthroplasty with loosening and poly wear.  PLAN:  Revision total hip, left hip.  Also, note that she has an onychomycosis of her great toe on the right and wonders if this could be removed at the same  time while she is under surgery, we will discuss that with Dr. Charlann Boxer.  Questions invited and answered.     Jaquelyn Bitter. Ordean Fouts, P.A.   ______________________________ Madlyn Frankel Charlann Boxer, M.D.    SJC/MEDQ  D:  03/06/2011  T:  03/07/2011  Job:  [id number]

## 2011-03-07 NOTE — Pre-Procedure Instructions (Signed)
COURTNEY AT DR. Nilsa Nutting OFFICE NOTIFIED TO LET DR. OLIN KNOW TO CHECK MRS. Jocelyn Bauer'S ABNORMAL UA REPORT IN EPIC

## 2011-03-11 ENCOUNTER — Inpatient Hospital Stay (HOSPITAL_COMMUNITY): Payer: Medicare Other | Admitting: Anesthesiology

## 2011-03-11 ENCOUNTER — Encounter (HOSPITAL_COMMUNITY): Payer: Self-pay | Admitting: Anesthesiology

## 2011-03-11 ENCOUNTER — Inpatient Hospital Stay (HOSPITAL_COMMUNITY)
Admission: RE | Admit: 2011-03-11 | Discharge: 2011-03-14 | DRG: 468 | Disposition: A | Discharge status: Skilled Nursing Facility | Payer: Medicare Other | Source: Ambulatory Visit | Attending: Orthopedic Surgery | Admitting: Orthopedic Surgery

## 2011-03-11 ENCOUNTER — Inpatient Hospital Stay (HOSPITAL_COMMUNITY): Payer: Medicare Other

## 2011-03-11 ENCOUNTER — Encounter (HOSPITAL_COMMUNITY)
Admission: RE | Disposition: A | Discharge status: Skilled Nursing Facility | Payer: Self-pay | Source: Ambulatory Visit | Attending: Orthopedic Surgery

## 2011-03-11 ENCOUNTER — Encounter (HOSPITAL_COMMUNITY): Payer: Self-pay | Admitting: *Deleted

## 2011-03-11 DIAGNOSIS — Z5189 Encounter for other specified aftercare: Secondary | ICD-10-CM | POA: Diagnosis not present

## 2011-03-11 DIAGNOSIS — E039 Hypothyroidism, unspecified: Secondary | ICD-10-CM | POA: Diagnosis not present

## 2011-03-11 DIAGNOSIS — Z471 Aftercare following joint replacement surgery: Secondary | ICD-10-CM | POA: Diagnosis not present

## 2011-03-11 DIAGNOSIS — K219 Gastro-esophageal reflux disease without esophagitis: Secondary | ICD-10-CM | POA: Diagnosis present

## 2011-03-11 DIAGNOSIS — T84039A Mechanical loosening of unspecified internal prosthetic joint, initial encounter: Secondary | ICD-10-CM | POA: Diagnosis present

## 2011-03-11 DIAGNOSIS — S79919A Unspecified injury of unspecified hip, initial encounter: Secondary | ICD-10-CM | POA: Diagnosis not present

## 2011-03-11 DIAGNOSIS — E890 Postprocedural hypothyroidism: Secondary | ICD-10-CM | POA: Diagnosis present

## 2011-03-11 DIAGNOSIS — Z96629 Presence of unspecified artificial elbow joint: Secondary | ICD-10-CM

## 2011-03-11 DIAGNOSIS — Z96649 Presence of unspecified artificial hip joint: Secondary | ICD-10-CM | POA: Diagnosis not present

## 2011-03-11 DIAGNOSIS — Y831 Surgical operation with implant of artificial internal device as the cause of abnormal reaction of the patient, or of later complication, without mention of misadventure at the time of the procedure: Secondary | ICD-10-CM | POA: Diagnosis present

## 2011-03-11 DIAGNOSIS — T84069A Wear of articular bearing surface of unspecified internal prosthetic joint, initial encounter: Principal | ICD-10-CM | POA: Diagnosis present

## 2011-03-11 DIAGNOSIS — I10 Essential (primary) hypertension: Secondary | ICD-10-CM | POA: Diagnosis present

## 2011-03-11 DIAGNOSIS — M25559 Pain in unspecified hip: Secondary | ICD-10-CM | POA: Diagnosis present

## 2011-03-11 DIAGNOSIS — T84019A Broken internal joint prosthesis, unspecified site, initial encounter: Secondary | ICD-10-CM | POA: Diagnosis not present

## 2011-03-11 DIAGNOSIS — T8489XA Other specified complication of internal orthopedic prosthetic devices, implants and grafts, initial encounter: Secondary | ICD-10-CM | POA: Diagnosis not present

## 2011-03-11 DIAGNOSIS — M069 Rheumatoid arthritis, unspecified: Secondary | ICD-10-CM | POA: Diagnosis present

## 2011-03-11 DIAGNOSIS — R262 Difficulty in walking, not elsewhere classified: Secondary | ICD-10-CM | POA: Diagnosis not present

## 2011-03-11 DIAGNOSIS — T84029A Dislocation of unspecified internal joint prosthesis, initial encounter: Secondary | ICD-10-CM | POA: Diagnosis not present

## 2011-03-11 HISTORY — PX: TOTAL HIP REVISION: SHX763

## 2011-03-11 SURGERY — TOTAL HIP REVISION
Anesthesia: Spinal | Site: Hip | Laterality: Left

## 2011-03-11 MED ORDER — PREDNISONE 2.5 MG PO TABS
2.5000 mg | ORAL_TABLET | Freq: Every day | ORAL | Status: DC
  Administered 2011-03-11 – 2011-03-14 (×4): 2.5 mg via ORAL
  Filled 2011-03-11 (×5): qty 1

## 2011-03-11 MED ORDER — DIPHENHYDRAMINE HCL 25 MG PO CAPS: 25.0000 mg | ORAL_CAPSULE | Freq: Four times a day (QID) | ORAL | Status: DC | PRN

## 2011-03-11 MED ORDER — RIVAROXABAN 10 MG PO TABS
10.0000 mg | ORAL_TABLET | ORAL | Status: DC
  Administered 2011-03-12 – 2011-03-14 (×3): 10 mg via ORAL
  Filled 2011-03-11 (×3): qty 1

## 2011-03-11 MED ORDER — PANTOPRAZOLE SODIUM 40 MG PO TBEC
40.0000 mg | DELAYED_RELEASE_TABLET | Freq: Every morning | ORAL | Status: DC
  Administered 2011-03-12 – 2011-03-14 (×3): 40 mg via ORAL
  Filled 2011-03-11 (×3): qty 1

## 2011-03-11 MED ORDER — DROPERIDOL 2.5 MG/ML IJ SOLN
0.6250 mg | INTRAMUSCULAR | Status: DC | PRN
  Filled 2011-03-11: qty 0.25

## 2011-03-11 MED ORDER — BUPIVACAINE-EPINEPHRINE 0.25% -1:200000 IJ SOLN
INTRAMUSCULAR | Status: AC
  Filled 2011-03-11: qty 1

## 2011-03-11 MED ORDER — FERROUS SULFATE 325 (65 FE) MG PO TABS
325.0000 mg | ORAL_TABLET | Freq: Three times a day (TID) | ORAL | Status: DC
Start: 2011-03-11 — End: 2011-03-14
  Administered 2011-03-12 – 2011-03-14 (×6): 325 mg via ORAL
  Filled 2011-03-11 (×8): qty 1

## 2011-03-11 MED ORDER — ACETAMINOPHEN 650 MG RE SUPP: 650.0000 mg | Freq: Four times a day (QID) | RECTAL | Status: DC | PRN

## 2011-03-11 MED ORDER — MEPERIDINE HCL 50 MG/ML IJ SOLN: 6.2500 mg | INTRAMUSCULAR | Status: DC | PRN

## 2011-03-11 MED ORDER — BISACODYL 5 MG PO TBEC: 5.0000 mg | DELAYED_RELEASE_TABLET | Freq: Every day | ORAL | Status: DC | PRN

## 2011-03-11 MED ORDER — HYDROCODONE-ACETAMINOPHEN 7.5-325 MG PO TABS
1.0000 | ORAL_TABLET | ORAL | Status: DC
  Administered 2011-03-11 – 2011-03-12 (×5): 1 via ORAL
  Administered 2011-03-12 (×2): 2 via ORAL
  Administered 2011-03-13 (×2): 1 via ORAL
  Administered 2011-03-13 (×2): 2 via ORAL
  Administered 2011-03-13: 1 via ORAL
  Administered 2011-03-13 – 2011-03-14 (×2): 2 via ORAL
  Administered 2011-03-14 (×2): 1 via ORAL
  Filled 2011-03-11: qty 2
  Filled 2011-03-11 (×3): qty 1
  Filled 2011-03-11: qty 2
  Filled 2011-03-11 (×3): qty 1
  Filled 2011-03-11 (×5): qty 2
  Filled 2011-03-11 (×3): qty 1

## 2011-03-11 MED ORDER — FLEET ENEMA 7-19 GM/118ML RE ENEM: 1.0000 | ENEMA | Freq: Once | RECTAL | Status: AC | PRN

## 2011-03-11 MED ORDER — 0.9 % SODIUM CHLORIDE (POUR BTL) OPTIME
TOPICAL | Status: DC | PRN
  Administered 2011-03-11: 1000 mL

## 2011-03-11 MED ORDER — CHLORHEXIDINE GLUCONATE 4 % EX LIQD: 60.0000 mL | Freq: Once | CUTANEOUS | Status: DC

## 2011-03-11 MED ORDER — METHOCARBAMOL 100 MG/ML IJ SOLN
500.0000 mg | Freq: Four times a day (QID) | INTRAVENOUS | Status: DC | PRN
  Administered 2011-03-11: 500 mg via INTRAVENOUS
  Filled 2011-03-11: qty 5

## 2011-03-11 MED ORDER — ONDANSETRON HCL 4 MG/2ML IJ SOLN
INTRAMUSCULAR | Status: DC | PRN
  Administered 2011-03-11: 4 mg via INTRAVENOUS

## 2011-03-11 MED ORDER — FENTANYL CITRATE 0.05 MG/ML IJ SOLN
INTRAMUSCULAR | Status: DC | PRN
  Administered 2011-03-11 (×3): 50 ug via INTRAVENOUS

## 2011-03-11 MED ORDER — LEVOTHYROXINE SODIUM 100 MCG PO TABS
100.0000 ug | ORAL_TABLET | Freq: Every day | ORAL | Status: DC
  Administered 2011-03-12 – 2011-03-14 (×3): 100 ug via ORAL
  Filled 2011-03-11 (×3): qty 1

## 2011-03-11 MED ORDER — METHOCARBAMOL 500 MG PO TABS
500.0000 mg | ORAL_TABLET | Freq: Four times a day (QID) | ORAL | Status: DC | PRN
  Administered 2011-03-11 – 2011-03-13 (×5): 500 mg via ORAL
  Filled 2011-03-11 (×5): qty 1

## 2011-03-11 MED ORDER — BENAZEPRIL HCL 20 MG PO TABS
20.0000 mg | ORAL_TABLET | Freq: Every morning | ORAL | Status: DC
  Administered 2011-03-12 – 2011-03-14 (×2): 20 mg via ORAL
  Filled 2011-03-11 (×3): qty 1

## 2011-03-11 MED ORDER — DEXAMETHASONE SODIUM PHOSPHATE 10 MG/ML IJ SOLN
INTRAMUSCULAR | Status: DC | PRN
  Administered 2011-03-11: 10 mg via INTRAVENOUS

## 2011-03-11 MED ORDER — PROPOFOL 10 MG/ML IV EMUL
INTRAVENOUS | Status: DC | PRN
  Administered 2011-03-11: 75 ug/kg/min via INTRAVENOUS

## 2011-03-11 MED ORDER — ZOLPIDEM TARTRATE 5 MG PO TABS
5.0000 mg | ORAL_TABLET | Freq: Every evening | ORAL | Status: DC | PRN
  Administered 2011-03-11: 5 mg via ORAL
  Filled 2011-03-11: qty 1

## 2011-03-11 MED ORDER — BUPIVACAINE HCL (PF) 0.5 % IJ SOLN
INTRAMUSCULAR | Status: AC
  Filled 2011-03-11: qty 30

## 2011-03-11 MED ORDER — CEFAZOLIN SODIUM 1-5 GM-% IV SOLN
1.0000 g | Freq: Four times a day (QID) | INTRAVENOUS | Status: AC
  Administered 2011-03-11 – 2011-03-12 (×3): 1 g via INTRAVENOUS
  Filled 2011-03-11 (×3): qty 50

## 2011-03-11 MED ORDER — ACETAMINOPHEN 10 MG/ML IV SOLN
INTRAVENOUS | Status: AC
  Filled 2011-03-11: qty 100

## 2011-03-11 MED ORDER — EPHEDRINE SULFATE 50 MG/ML IJ SOLN
INTRAMUSCULAR | Status: DC | PRN
  Administered 2011-03-11: 5 mg via INTRAVENOUS
  Administered 2011-03-11 (×2): 10 mg via INTRAVENOUS

## 2011-03-11 MED ORDER — LACTATED RINGERS IV SOLN
INTRAVENOUS | Status: DC
  Administered 2011-03-11: 1000 mL via INTRAVENOUS

## 2011-03-11 MED ORDER — ACETAMINOPHEN 325 MG PO TABS: 650.0000 mg | ORAL_TABLET | Freq: Four times a day (QID) | ORAL | Status: DC | PRN

## 2011-03-11 MED ORDER — LACTATED RINGERS IV SOLN
INTRAVENOUS | Status: DC | PRN
  Administered 2011-03-11 (×2): via INTRAVENOUS

## 2011-03-11 MED ORDER — MORPHINE SULFATE 10 MG/ML IJ SOLN
INTRAMUSCULAR | Status: AC
  Filled 2011-03-11: qty 1

## 2011-03-11 MED ORDER — CEFAZOLIN SODIUM-DEXTROSE 2-3 GM-% IV SOLR
2.0000 g | Freq: Once | INTRAVENOUS | Status: AC
  Administered 2011-03-11: 2 g via INTRAVENOUS

## 2011-03-11 MED ORDER — ONDANSETRON HCL 4 MG PO TABS: 4.0000 mg | ORAL_TABLET | Freq: Four times a day (QID) | ORAL | Status: DC | PRN

## 2011-03-11 MED ORDER — SODIUM CHLORIDE 0.9 % IR SOLN
Status: DC | PRN
  Administered 2011-03-11: 3000 mL

## 2011-03-11 MED ORDER — TRANEXAMIC ACID 100 MG/ML IV SOLN
15.0000 mg/kg | Freq: Once | INTRAVENOUS | Status: AC
  Administered 2011-03-11: 1245 mg via INTRAVENOUS
  Filled 2011-03-11: qty 12.45

## 2011-03-11 MED ORDER — LACTATED RINGERS IV SOLN
INTRAVENOUS | Status: DC
  Administered 2011-03-11: 16:00:00 via INTRAVENOUS

## 2011-03-11 MED ORDER — ACETAMINOPHEN 10 MG/ML IV SOLN
INTRAVENOUS | Status: DC | PRN
  Administered 2011-03-11: 1000 mg via INTRAVENOUS

## 2011-03-11 MED ORDER — SODIUM CHLORIDE 0.9 % IV SOLN
100.0000 mL/h | INTRAVENOUS | Status: DC
  Administered 2011-03-11: 100 mL/h via INTRAVENOUS
  Administered 2011-03-12: 10 mL/h via INTRAVENOUS
  Filled 2011-03-11 (×10): qty 1000

## 2011-03-11 MED ORDER — AMLODIPINE BESYLATE 5 MG PO TABS
5.0000 mg | ORAL_TABLET | Freq: Every morning | ORAL | Status: DC
  Administered 2011-03-13: 5 mg via ORAL
  Filled 2011-03-11 (×3): qty 1

## 2011-03-11 MED ORDER — MORPHINE SULFATE 10 MG/ML IJ SOLN
1.0000 mg | INTRAMUSCULAR | Status: DC | PRN
  Administered 2011-03-11 (×5): 2 mg via INTRAVENOUS

## 2011-03-11 MED ORDER — PHENOL 1.4 % MT LIQD: 1.0000 | OROMUCOSAL | Status: DC | PRN

## 2011-03-11 MED ORDER — MENTHOL 3 MG MT LOZG: 1.0000 | LOZENGE | OROMUCOSAL | Status: DC | PRN

## 2011-03-11 MED ORDER — ALUM & MAG HYDROXIDE-SIMETH 200-200-20 MG/5ML PO SUSP: 30.0000 mL | ORAL | Status: DC | PRN

## 2011-03-11 MED ORDER — BUPIVACAINE IN DEXTROSE 0.75-8.25 % IT SOLN
INTRATHECAL | Status: DC | PRN
  Administered 2011-03-11: 1.8 mL via INTRATHECAL

## 2011-03-11 MED ORDER — POLYETHYLENE GLYCOL 3350 17 G PO PACK
17.0000 g | PACK | Freq: Two times a day (BID) | ORAL | Status: DC
  Administered 2011-03-11 – 2011-03-13 (×6): 17 g via ORAL
  Filled 2011-03-11 (×6): qty 1

## 2011-03-11 MED ORDER — METOCLOPRAMIDE HCL 5 MG/ML IJ SOLN: 5.0000 mg | Freq: Three times a day (TID) | INTRAMUSCULAR | Status: DC | PRN

## 2011-03-11 MED ORDER — ONDANSETRON HCL 4 MG/2ML IJ SOLN: 4.0000 mg | Freq: Four times a day (QID) | INTRAMUSCULAR | Status: DC | PRN

## 2011-03-11 MED ORDER — DOCUSATE SODIUM 100 MG PO CAPS
100.0000 mg | ORAL_CAPSULE | Freq: Two times a day (BID) | ORAL | Status: DC
Start: 2011-03-11 — End: 2011-03-14
  Administered 2011-03-11 – 2011-03-14 (×5): 100 mg via ORAL
  Filled 2011-03-11 (×7): qty 1

## 2011-03-11 MED ORDER — MIDAZOLAM HCL 5 MG/5ML IJ SOLN
INTRAMUSCULAR | Status: DC | PRN
  Administered 2011-03-11: 1 mg via INTRAVENOUS

## 2011-03-11 MED ORDER — METOCLOPRAMIDE HCL 5 MG PO TABS
5.0000 mg | ORAL_TABLET | Freq: Three times a day (TID) | ORAL | Status: DC | PRN
  Filled 2011-03-11: qty 2

## 2011-03-11 MED ORDER — HYDROMORPHONE HCL PF 1 MG/ML IJ SOLN: 0.5000 mg | INTRAMUSCULAR | Status: DC | PRN

## 2011-03-11 MED ORDER — HETASTARCH-ELECTROLYTES 6 % IV SOLN
INTRAVENOUS | Status: DC | PRN
  Administered 2011-03-11: 13:00:00 via INTRAVENOUS

## 2011-03-11 SURGICAL SUPPLY — 64 items
ADH SKN CLS APL DERMABOND .7 (GAUZE/BANDAGES/DRESSINGS) ×1
BAG SPEC THK2 15X12 ZIP CLS (MISCELLANEOUS) ×1
BAG ZIPLOCK 12X15 (MISCELLANEOUS) ×2 IMPLANT
BLADE SAW SGTL 18X1.27X75 (BLADE) ×2 IMPLANT
BRUSH FEMORAL CANAL (MISCELLANEOUS) ×2 IMPLANT
CLOTH BEACON ORANGE TIMEOUT ST (SAFETY) ×2 IMPLANT
DERMABOND ADVANCED (GAUZE/BANDAGES/DRESSINGS) ×1
DERMABOND ADVANCED .7 DNX12 (GAUZE/BANDAGES/DRESSINGS) IMPLANT
DRAPE INCISE IOBAN 85X60 (DRAPES) ×2 IMPLANT
DRAPE ORTHO SPLIT 77X108 STRL (DRAPES) ×4
DRAPE POUCH INSTRU U-SHP 10X18 (DRAPES) ×2 IMPLANT
DRAPE SURG 17X11 SM STRL (DRAPES) ×2 IMPLANT
DRAPE SURG ORHT 6 SPLT 77X108 (DRAPES) ×2 IMPLANT
DRAPE U-SHAPE 47X51 STRL (DRAPES) ×2 IMPLANT
DRSG AQUACEL AG ADV 3.5X10 (GAUZE/BANDAGES/DRESSINGS) ×1 IMPLANT
DRSG EMULSION OIL 3X16 NADH (GAUZE/BANDAGES/DRESSINGS) ×2 IMPLANT
DRSG MEPILEX BORDER 4X4 (GAUZE/BANDAGES/DRESSINGS) ×2 IMPLANT
DRSG MEPILEX BORDER 4X8 (GAUZE/BANDAGES/DRESSINGS) ×2 IMPLANT
DRSG TEGADERM 4X4.75 (GAUZE/BANDAGES/DRESSINGS) ×1 IMPLANT
DURAPREP 26ML APPLICATOR (WOUND CARE) ×2 IMPLANT
ELECT BLADE TIP CTD 4 INCH (ELECTRODE) ×2 IMPLANT
ELECT REM PT RETURN 9FT ADLT (ELECTROSURGICAL) ×2
ELECTRODE REM PT RTRN 9FT ADLT (ELECTROSURGICAL) ×1 IMPLANT
EVACUATOR 1/8 PVC DRAIN (DRAIN) ×2 IMPLANT
FACESHIELD LNG OPTICON STERILE (SAFETY) ×8 IMPLANT
GAUZE SPONGE 2X2 8PLY STRL LF (GAUZE/BANDAGES/DRESSINGS) IMPLANT
GLOVE BIOGEL PI IND STRL 7.5 (GLOVE) ×1 IMPLANT
GLOVE BIOGEL PI IND STRL 8 (GLOVE) ×1 IMPLANT
GLOVE BIOGEL PI INDICATOR 7.5 (GLOVE) ×1
GLOVE BIOGEL PI INDICATOR 8 (GLOVE) ×1
GLOVE ORTHO TXT STRL SZ7.5 (GLOVE) ×4 IMPLANT
GLOVE SURG ORTHO 8.0 STRL STRW (GLOVE) ×2 IMPLANT
GOWN BRE IMP PREV XXLGXLNG (GOWN DISPOSABLE) ×2 IMPLANT
GOWN STRL NON-REIN LRG LVL3 (GOWN DISPOSABLE) ×2 IMPLANT
GRAFT DBM PARTICULATE CANC 10 (Bone Implant) IMPLANT
GRAFT DBM PARTICULATE CANC 15 (Bone Implant) IMPLANT
GRAFT IC CHAMBER LG (Bone Implant) ×4 IMPLANT
GRAFT IC CHAMBER MED (Bone Implant) ×2 IMPLANT
HANDPIECE INTERPULSE COAX TIP (DISPOSABLE) ×2
HEAD OSTEO CTAPER L FIT (Orthopedic Implant) ×2 IMPLANT
HEAD OSTEO CTAPER L FIT 25 +5 (Orthopedic Implant) IMPLANT
INSERT OMNIFIT CROSSFIRE 28MM (Insert) ×1 IMPLANT
KIT BASIN OR (CUSTOM PROCEDURE TRAY) ×2 IMPLANT
MANIFOLD NEPTUNE II (INSTRUMENTS) ×2 IMPLANT
NS IRRIG 1000ML POUR BTL (IV SOLUTION) ×4 IMPLANT
PACK TOTAL JOINT (CUSTOM PROCEDURE TRAY) ×2 IMPLANT
POSITIONER SURGICAL ARM (MISCELLANEOUS) ×2 IMPLANT
PRESSURIZER FEMORAL UNIV (MISCELLANEOUS) ×2 IMPLANT
SCREW OSTEA HMSPHR 35X6.5XCANC (Screw) IMPLANT
SCREW OSTEO 25MM (Screw) ×4 IMPLANT
SCREW OSTEO HMSPHR 40X6.5XCANC (Screw) IMPLANT
SET HNDPC FAN SPRY TIP SCT (DISPOSABLE) ×1 IMPLANT
SPONGE GAUZE 2X2 STER 10/PKG (GAUZE/BANDAGES/DRESSINGS) ×1
SPONGE LAP 18X18 X RAY DECT (DISPOSABLE) ×2 IMPLANT
SPONGE LAP 4X18 X RAY DECT (DISPOSABLE) ×2 IMPLANT
STAPLER VISISTAT 35W (STAPLE) ×2 IMPLANT
SUCTION FRAZIER TIP 10 FR DISP (SUCTIONS) ×2 IMPLANT
SUT VIC AB 1 CT1 36 (SUTURE) ×6 IMPLANT
SUT VIC AB 2-0 CT1 27 (SUTURE) ×6
SUT VIC AB 2-0 CT1 TAPERPNT 27 (SUTURE) ×3 IMPLANT
TOWEL OR 17X26 10 PK STRL BLUE (TOWEL DISPOSABLE) ×4 IMPLANT
TOWER CARTRIDGE SMART MIX (DISPOSABLE) ×2 IMPLANT
TRAY FOLEY CATH 14FRSI W/METER (CATHETERS) ×2 IMPLANT
WATER STERILE IRR 1500ML POUR (IV SOLUTION) ×2 IMPLANT

## 2011-03-11 NOTE — H&P (View-Only) (Signed)
H&P Dictated. Dictation # [id number]

## 2011-03-11 NOTE — Anesthesia Procedure Notes (Addendum)
Anesthesia Regional Block:   Narrative:    Spinal   Spinal  Patient location during procedure: OR Staffing Anesthesiologist: Phillips Grout Performed by: anesthesiologist  Preanesthetic Checklist Completed: patient identified, site marked, surgical consent, pre-op evaluation, timeout performed, IV checked, risks and benefits discussed and monitors and equipment checked Spinal Block Patient position: sitting Prep: Betadine Patient monitoring: heart rate, continuous pulse ox and blood pressure Approach: midline Location: L3-4 Injection technique: single-shot Needle Needle type: Spinocan  Needle gauge: 22 G Needle length: 9 cm Additional Notes Expiration date of kit checked and confirmed. Patient tolerated procedure well, without complications.

## 2011-03-11 NOTE — Anesthesia Postprocedure Evaluation (Signed)
  Anesthesia Post-op Note  Patient: Jocelyn Bauer  Procedure(s) Performed: Procedure(s) (LRB): TOTAL HIP REVISION (Left)  Patient Location: PACU  Anesthesia Type: Spinal  Level of Consciousness: awake and alert   Airway and Oxygen Therapy: Patient Spontanous Breathing  Post-op Pain: mild  Post-op Assessment: Post-op Vital signs reviewed, Patient's Cardiovascular Status Stable, Respiratory Function Stable, Patent Airway and No signs of Nausea or vomiting  Post-op Vital Signs: stable  Complications: No apparent anesthesia complications

## 2011-03-11 NOTE — Plan of Care (Signed)
Problem: Consults Goal: Diagnosis- Total Joint Replacement Outcome: Progressing Left hip revision

## 2011-03-11 NOTE — Interval H&P Note (Signed)
History and Physical Interval Note:  03/11/2011 11:27 AM  Jocelyn Bauer  has presented today for surgery, with the diagnosis of Failed Left Total Hip  The various methods of treatment have been discussed with the patient and family. After consideration of risks, benefits and other options for treatment, the patient has consented to  Procedure(s) (LRB):LEFT TOTAL HIP REVISION (Left) as a surgical intervention .  The patients' history has been reviewed, patient examined, no change in status, stable for surgery.  I have reviewed the patients' chart and labs.  Questions were answered to the patient's satisfaction.     [name]

## 2011-03-11 NOTE — Anesthesia Postprocedure Evaluation (Signed)
  Anesthesia Post-op Note  Patient: Jocelyn Bauer  Procedure(s) Performed: Procedure(s) (LRB): TOTAL HIP REVISION (Left)  Patient Location: PACU  Anesthesia Type: Spinal  Level of Consciousness: awake and alert   Airway and Oxygen Therapy: Patient Spontanous Breathing  Post-op Pain: mild  Post-op Assessment: Post-op Vital signs reviewed, Patient's Cardiovascular Status Stable, Respiratory Function Stable, Patent Airway and No signs of Nausea or vomiting  Post-op Vital Signs: stable  Complications: No apparent anesthesia complications  

## 2011-03-11 NOTE — Preoperative (Signed)
Beta Blockers   Reason not to administer Beta Blockers:Not Applicable 

## 2011-03-11 NOTE — Brief Op Note (Signed)
03/11/2011  3:25 PM  PATIENT:  Jocelyn Bauer  78 y.o. female  PRE-OPERATIVE DIAGNOSIS:  Failed Left Total Hip, secondary to significant polyethylene wear  POST-OPERATIVE DIAGNOSIS:  failed left total hip, secondary to significant polyethylene wear   PROCEDURE:  Procedure(s) (LRB): TOTAL HIP REVISION (Left), debridement of significant osteolyitc wear, with allograft bone grafting, polyethylene revison and head revision  Components: Stryker series II liner for 46/48, 20 degree eccentric liner and 28+5 femoral head  SURGEON:  Surgeon(s) and Role:    * [name], MD - Primary  PHYSICIAN ASSISTANT: Lanney Gins, PA-C   ANESTHESIA:   spinal  EBL:  Total I/O In: 2250 [I.V.:1750; IV Piggyback:500] Out: 450 [Urine:250; Blood:200]  BLOOD ADMINISTERED:none  DRAINS: (1 medium) Hemovact drain(s) in the left hip with  Suction Open   LOCAL MEDICATIONS USED:  NONE  SPECIMEN:  No Specimen  DISPOSITION OF SPECIMEN:  N/A  COUNTS:  YES  TOURNIQUET:  * No tourniquets in log *  DICTATION: .Other Dictation: Dictation Number [id number]  PLAN OF CARE: Admit to inpatient   PATIENT DISPOSITION:  PACU - hemodynamically stable.   Delay start of Pharmacological VTE agent (>24hrs) due to surgical blood loss or risk of bleeding: no

## 2011-03-11 NOTE — Anesthesia Preprocedure Evaluation (Signed)
Anesthesia Evaluation  Patient identified by MRN, date of birth, ID band Patient awake    Reviewed: Allergy & Precautions, H&P , NPO status , Patient's Chart, lab work & pertinent test results  Airway Mallampati: II TM Distance: >3 FB Neck ROM: Full    Dental No notable dental hx.    Pulmonary neg pulmonary ROS,  breath sounds clear to auscultation  Pulmonary exam normal       Cardiovascular hypertension, negative cardio ROS  Rhythm:Regular Rate:Normal     Neuro/Psych negative neurological ROS  negative psych ROS   GI/Hepatic negative GI ROS, Neg liver ROS, GERD-  ,  Endo/Other  negative endocrine ROSHypothyroidism   Renal/GU negative Renal ROS  negative genitourinary   Musculoskeletal negative musculoskeletal ROS (+)   Abdominal   Peds negative pediatric ROS (+)  Hematology negative hematology ROS (+)   Anesthesia Other Findings   Reproductive/Obstetrics negative OB ROS                           Anesthesia Physical Anesthesia Plan  ASA: III  Anesthesia Plan: Spinal   Post-op Pain Management:    Induction:   Airway Management Planned:   Additional Equipment:   Intra-op Plan:   Post-operative Plan:   Informed Consent: I have reviewed the patients History and Physical, chart, labs and discussed the procedure including the risks, benefits and alternatives for the proposed anesthesia with the patient or authorized representative who has indicated his/her understanding and acceptance.   Dental advisory given  Plan Discussed with: CRNA  Anesthesia Plan Comments:         Anesthesia Quick Evaluation

## 2011-03-11 NOTE — Transfer of Care (Signed)
Immediate Anesthesia Transfer of Care Note  Patient: Jocelyn Bauer  Procedure(s) Performed: Procedure(s) (LRB): TOTAL HIP REVISION (Left)  Patient Location: PACU  Anesthesia Type: Spinal  Level of Consciousness: awake, alert , oriented, patient cooperative and responds to stimulation  Airway & Oxygen Therapy: Patient Spontanous Breathing and Patient connected to face mask oxygen  Post-op Assessment: Report given to PACU RN and Post -op Vital signs reviewed and stable  Post vital signs: Reviewed and stable  Complications: No apparent anesthesia complications

## 2011-03-12 LAB — BASIC METABOLIC PANEL
BUN: 10 mg/dL (ref 6–23)
Chloride: 103 mEq/L (ref 96–112)
Creatinine, Ser: 0.7 mg/dL (ref 0.50–1.10)
GFR calc Af Amer: >90 mL/min (ref >90)
GFR calc non Af Amer: 80 mL/min — ABNORMAL LOW (ref >90)

## 2011-03-12 LAB — CBC
HCT: 31.4 % — ABNORMAL LOW (ref 36.0–46.0)
MCHC: 31.5 g/dL (ref 30.0–36.0)
MCV: 90.8 fL (ref 78.0–100.0)
RDW: 16.2 % — ABNORMAL HIGH (ref 11.5–15.5)

## 2011-03-12 NOTE — Progress Notes (Signed)
03/12/11 0545 Nursing Unable to chart hemovac totals in epic chart. Total drainage for 7p-7a is 100cc.

## 2011-03-12 NOTE — Op Note (Signed)
NAMESHAKHIA, GRAMAJO NO.:  [id number]  MEDICAL RECORD NO.:  [id number]  LOCATION:  1614                         FACILITY:  [facility]  PHYSICIAN:  Madlyn Frankel. Charlann Boxer, M.D.  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  POSTOPERATIVE DIAGNOSIS:  Failed and painful left total hip with significant polyethylene wear and known osteolytic changes to the pelvis and femur.  PROCEDURE:  Revision left hip surgery with focused on polyethylene revision with a new [employer]on[employer] series, liner to match a 48 cup that was 20-degree eccentric liner with a +6 buildup.  Two new cancellous screws were placed and new a 28-mm +5 ball was then utilized.  In addition, we used approximately 40 cc of cancellous bone chips as allograft packing of ischial and iliac bone defects.  SURGEON:  Madlyn Frankel. Charlann Boxer, M.D.  ASSISTANTS:  Lanney Gins, [facility]  Please note that Mr. Carmon Sails was present for the entirety of the case from preoperative positioning to the perioperative retractor management, management of left lower extremity throughout the case, particularly given the tenuous nature of the bone quality.  In addition, the primary wound closure and general facilitation of the case.  ANESTHESIA:  Spinal.  SPECIMENS:  None.  COMPLICATIONS:  None apparent.  ESTIMATED BLOOD LOSS:  About 200 cc.  DRAINS:  One medium Hemovac.  INDICATION FOR PROCEDURE:  Ms. Romey is an 78 year old female with a history of left total hip replacement in 1998.  She presented to the office for further evaluation with significant polyethylene wear noted radiographically.  She was having increasing pain and discomfort.  Bone scan had revealed concerns of possible loosening of  components.  Other etiology of pain was most likely related to some sort of fracture of the femoral or acetabular regions based on significant thinning with osteolytic changes.  After reviewing the planned procedure of full-blown revision surgery versus polyethylene exchange, consent was obtained for benefit of pain relief as well as for long-term stability of the components.  She was noted to have a hybrid left total hip revision with cemented components, but did not have evidence of obvious loosening by radiographs.  Consent was obtained.  PROCEDURE IN DETAIL:  The patient was brought to the operative theater. Once adequate anesthesia, preoperative antibiotics, Ancef 2 g administered, the patient was positioned into the right lateral decubitus position with the left side up.  The left lower extremity was then prepped and draped in a sterile fashion and a time-out was performed, identifying the patient, planned procedure, and extremity. The patient had a big Southern type incision over the lateral aspect of the left hip.  A portion of this was then excised and utilized for exposure.  Soft tissue planes were divided to the area of iliotibial band and gluteal fascia, which were then incised for posterior approach.  Once the gluteus maximus was split down to the posterior capsule, it was evident that there was significant osteolytic wearing as well as some  metal-type debris staining over the synovium.  Once I opened up the pseudocapsule posteriorly, significant amount of probably 20-30 mL of fluid, which was brown and stained, consistent with this osteolytic change as well as some slight metal staining was evacuated.  Following the initial debridement of the synovium for adequate exposure, we got down to the hip joint.  It was noticed just by palpation that her bone quality posteriorly and on the ilium was extremely weak for bone quality.  There was significant loss of bone as noted  radiographically in the proximal femur.  Greater portion of this case was carried out with debridement.  Initially, I debrided around the in the femur and all synovial soft tissues.  The femur was then carefully dislocated using a bone hook.  The femoral head was removed.  The femoral component was noted to be stable grossly as well as tourniquet was applied on during the case and remained stable the whole time without evidence of any loosening or motion at all.  Please note that her greater trochanter was completely fragmented off, extremely thin, and the remaining portion was partially debrided, but portion of that was left within the gluteus medius.  At this point, with [facility] holding the femur anteriorly with a bone hook to prevent any retractions anteriorly.  I spent a lot of time at this point cleaning up around the acetabulum.  Once the acetabulum was exposed, I removed the liner using a 6.5 cancellous screw popping liner out.  I then removed the two cancellous screws that were placed in the ilium before and at this point assessed the acetabular stability.  With the direct application of pressure against the acetabular component, I did not detect any loosening at all.  I was surprised and grateful with the acetabular stability due to the significant osteolytic changes around the bone and her remaining bone stock quality.  Given these findings, I irrigated out the hip with approximately a liter of normal saline solution at this point within the femur as well as the acetabulum to remove any of the extra debris.  This allowing for better visualization of the remaining anatomy.  She was noted to have a very large cyst about the size of my thumb down to the mid proximal phalanx area, which represents at least a 2-cm area of bone mass in the ischium.  In the ilium, on radiographs, was noted to have significant osteolytic changes as well.  Given the stability of the acetabular  cup, I went ahead and placed two 40-mm cancellous screws in the holes where they previously placed 35-mm screws.  These had good fixation.  At this point, the gluteus medius and capsule were elevated off the ilium laterally and the retractor was placed.  I then created about a centimeter type wound into the ilium and was able to, at this point, remove all the osteolytic debris from the ilium.  Once this had been irrigated out, I packed approximately about 20-25 cc of bone graft into the ilium and to these remaining defects.  The bone that was osteotomized off the lateral ilium was placed over the top of the gluteus medius and laid on top of it.  Another 15 to 20 cc of bone was then impacted into the ischium posteriorly.  Once I felt that the ischium and ilium were adequately packed with bone graft and that the cup and Pinnacle were stable, we did a trial reduction and chose to use a 20-degree of eccentric liner versus  the 20- degree neutral liner that required the use of a +10 ball from the previous surgery.  I opened the 20-degree eccentric liner; and with the 20-degree portion at approximately 2 o'clock to 3 o'clock on his left foot, it was impacted into position and secured.  I did a trial reduction and was happy with the 5-mm 28 head match the leg length and stability of the hip without evidence any significant findings concerning for subluxation.  Following the cleaning off the trunnion, again a 28-mm ball was impacted on the clean and dry trunnion, and the hip was reduced.  We irrigated the hip again.  I reapproximated the remaining pseudocapsular tissue on the posterior aspect of the hip with a #1 Vicryl and placed a medium Hemovac drain deep.  The iliotibial band and gluteal fascia were then reapproximated using #1 Vicryl.  The remaining wound was closed with 2-0 Vicryl and running 4-0 Monocryl. The hip was cleaned, dried, and dressed sterilely using Dermabond and Aquacel  dressing.  The patient was then brought to recovery room in stable condition tolerating the procedure well.     Madlyn Frankel Charlann Boxer, M.D.     MDO/MEDQ  D:  03/11/2011  T:  03/12/2011  Job:  [id number]

## 2011-03-12 NOTE — Evaluation (Signed)
Physical Therapy Evaluation Patient Details Name: Jocelyn Bauer MRN: [id number] DOB: 03-13-1928 Today's Date: 03/12/2011  Problem List:  Patient Active Problem List  Diagnoses  . S/P revision of left total hip    Past Medical History:  Past Medical History  Diagnosis Date  . Hypertension   . Shortness of breath     occas  . Arthritis     RA  . Hypothyroidism   . Hyperlipidemia   . GERD (gastroesophageal reflux disease)   . Anxiety     HX OF PANIC ATTACKS   Past Surgical History:  Past Surgical History  Procedure Date  . Joint replacement 1998    left hip replacement and bilateral elbow replacements  about 15 yrs ago  . Thyroidectomy     PT Assessment/Plan/Recommendation PT Assessment Clinical Impression Statement: Pt with L THR revision presents with decreased L LE strength, limited use of L UE  and limited functional mobility. PT Recommendation/Assessment: Patient will need skilled PT in the acute care venue PT Problem List: Decreased strength;Decreased range of motion;Decreased activity tolerance;Decreased mobility;Decreased knowledge of use of DME;Decreased knowledge of precautions;Pain PT Therapy Diagnosis : Difficulty walking PT Plan PT Frequency: 7X/week PT Treatment/Interventions: DME instruction;Gait training;Functional mobility training;Therapeutic exercise;Therapeutic activities;Patient/family education PT Recommendation Recommendations for Other Services: OT consult Follow Up Recommendations: Skilled nursing facility Equipment Recommended: Defer to next venue PT Goals  Acute Rehab PT Goals PT Goal Formulation: With patient Time For Goal Achievement: 7 days Pt will go Supine/Side to Sit: with min assist PT Goal: Supine/Side to Sit - Progress: Goal set today Pt will go Sit to Supine/Side: with min assist PT Goal: Sit to Supine/Side - Progress: Goal set today Pt will go Sit to Stand: with min assist PT Goal: Sit to Stand - Progress: Goal set today Pt  will go Stand to Sit: with min assist PT Goal: Stand to Sit - Progress: Goal set today Pt will Ambulate: 16 - 50 feet;with min assist;with rolling walker PT Goal: Ambulate - Progress: Goal set today  PT Evaluation Precautions/Restrictions  Precautions Precautions: Posterior Hip Precaution Comments: sign hung in room Restrictions Weight Bearing Restrictions: No Other Position/Activity Restrictions: WBAT Prior Functioning  Home Living Lives With: Alone Receives Help From: Family Additional Comments: Plan is for d/c to [facility] Prior Function Level of Independence: Independent with basic ADLs;Requires assistive device for independence Cognition Cognition Arousal/Alertness: Awake/alert Overall Cognitive Status: Appears within functional limits for tasks assessed Orientation Level: Oriented X4 Sensation/Coordination Coordination Gross Motor Movements are Fluid and Coordinated: Yes Extremity Assessment RUE Assessment RUE Assessment: Within Functional Limits LUE Assessment LUE Assessment: Exceptions to WFL LUE AROM (degrees) Overall AROM Left Upper Extremity: Deficits;Due to premorbid status LUE Overall AROM Comments: Pts elbow contracted to 90 degrees from elbow replacement 20 yrs ago. LUE Strength LUE Overall Strength: Within Functional Limits for tasks assessed (WFL at shoulder, wrist and hand.) RLE Assessment RLE Assessment: Within Functional Limits LLE Assessment LLE Assessment: Exceptions to WFL [facility] WFL following THP; 2+/5 hip; 3/5 quads) Mobility (including Balance) Bed Mobility Bed Mobility: Yes Supine to Sit: 3: Mod assist;2: Max assist Supine to Sit Details (indicate cue type and reason): cues for sequence and use of R LE to self assist - pt with limited use of L UE 2* elbow contracture Sit to Supine: 3: Mod assist;HOB flat Sit to Supine - Details (indicate cue type and reason): A needed for trunk and LEs. Transfers Sit to Stand: 3: Mod assist;With upper extremity  assist;From chair/3-in-1 Sit  to Stand Details (indicate cue type and reason): VCs for hand placement, LE position, correct use of PFRW. Stand to Sit: 4: Min assist;With upper extremity assist;With armrests;To chair/3-in-1;To bed Stand to Sit Details: VCs for hand placement. Ambulation/Gait Ambulation/Gait: Yes Ambulation/Gait Assistance: 1: +2 Total assist (pt 70%) Ambulation/Gait Assistance Details (indicate cue type and reason): cues for posture, sequence, position from RW and PWB Ambulation Distance (Feet): 18 Feet (18' x 2) Assistive device: Left platform walker Gait Pattern: Step-to pattern    Exercise  Total Joint Exercises Ankle Circles/Pumps: AROM;10 reps;Supine;Both Heel Slides: AAROM;10 reps;Supine;Left Hip ABduction/ADduction: AAROM;10 reps;Supine;Left End of Session PT - End of Session Equipment Utilized During Treatment: Gait belt Activity Tolerance: Patient tolerated treatment well Patient left: in chair;with call bell in reach;with family/visitor present Nurse Communication: Mobility status for transfers;Mobility status for ambulation General Behavior During Session: [facility] for tasks performed Cognition: [facility] for tasks performed  Giulio Bertino 03/12/2011, 3:55 PM

## 2011-03-12 NOTE — Evaluation (Signed)
Occupational Therapy Evaluation Patient Details Name: Jocelyn Bauer MRN: [id number] DOB: Oct 03, 1928 Today's Date: 03/12/2011  Problem List:  Patient Active Problem List  Diagnoses  . S/P revision of left total hip    Past Medical History:  Past Medical History  Diagnosis Date  . Hypertension   . Shortness of breath     occas  . Arthritis     RA  . Hypothyroidism   . Hyperlipidemia   . GERD (gastroesophageal reflux disease)   . Anxiety     HX OF PANIC ATTACKS   Past Surgical History:  Past Surgical History  Procedure Date  . Joint replacement 1998    left hip replacement and bilateral elbow replacements  about 15 yrs ago  . Thyroidectomy     OT Assessment/Plan/Recommendation OT Assessment Clinical Impression Statement: Pt mobilizing well POD#1 LTHR. Skilled OT recommended to maximize I w/BADLs to min A/supervision level in prep for d/c to next venue of care. OT Recommendation/Assessment: Patient will need skilled OT in the acute care venue OT Problem List: Decreased activity tolerance;Decreased knowledge of use of DME or AE;Decreased knowledge of precautions;Impaired UE functional use Barriers to Discharge: Inaccessible home environment;Decreased caregiver support OT Therapy Diagnosis : Generalized weakness OT Plan OT Frequency: Min 1X/week OT Treatment/Interventions: Self-care/ADL training;Therapeutic activities;DME and/or AE instruction;Patient/family education OT Recommendation Follow Up Recommendations: Skilled nursing facility Equipment Recommended: Defer to next venue Individuals Consulted Consulted and Agree with Results and Recommendations: Patient;Family member/caregiver OT Goals Acute Rehab OT Goals OT Goal Formulation: With patient/family Time For Goal Achievement: 2 weeks ADL Goals Pt Will Perform Grooming: with supervision;Standing at sink (X 3 tasks to improve standing activity tolerance.) ADL Goal: Grooming - Progress: Goal set today Pt Will  Transfer to Toilet: with supervision;3-in-1;Maintaining hip precautions;Ambulation ADL Goal: Toilet Transfer - Progress: Goal set today Pt Will Perform Toileting - Clothing Manipulation: with supervision;Standing ADL Goal: Toileting - Clothing Manipulation - Progress: Goal set today Pt Will Perform Toileting - Hygiene: with supervision;Sit to stand from 3-in-1/toilet ADL Goal: Toileting - Hygiene - Progress: Goal set today  OT Evaluation Precautions/Restrictions  Precautions Precautions: Posterior Hip Restrictions Weight Bearing Restrictions: No Other Position/Activity Restrictions: WBAT Prior Functioning Home Living Lives With: Alone Additional Comments: Plan is for d/c to [facility] Prior Function Level of Independence: Independent with basic ADLs;Requires assistive device for independence ADL ADL Eating/Feeding: Performed;Set up Grooming: Simulated;Minimal assistance Where Assessed - Grooming: Sitting, chair;Unsupported Upper Body Bathing: Simulated;Minimal assistance Where Assessed - Upper Body Bathing: Unsupported;Sitting, chair Lower Body Bathing: Simulated;Maximal assistance Where Assessed - Lower Body Bathing: Sit to stand from chair Upper Body Dressing: Simulated;Minimal assistance Where Assessed - Upper Body Dressing: Sit to stand from chair Lower Body Dressing: Simulated;Maximal assistance Where Assessed - Lower Body Dressing: Sit to stand from chair Toilet Transfer: Performed;Minimal assistance Toilet Transfer Details (indicate cue type and reason): Max VCs needed for safe technique manipulating PFRW in bathroom Toilet Transfer Method: Proofreader: Raised toilet seat with arms (or 3-in-1 over toilet) Toileting - Clothing Manipulation: Performed;Moderate assistance Where Assessed - Toileting Clothing Manipulation: Sit to stand from 3-in-1 or toilet Toileting - Hygiene: Performed;Minimal assistance Where Assessed - Toileting Hygiene: Sit to stand  from 3-in-1 or toilet Tub/Shower Transfer: Not assessed Tub/Shower Transfer Method: Not assessed Equipment Used: Other (comment) (PFRW, BSC) Ambulation Related to ADLs: Pt ambulated to BR with mod A. Vision/Perception    Cognition Cognition Arousal/Alertness: Awake/alert Overall Cognitive Status: Appears within functional limits for tasks assessed Orientation Level: Oriented X4  Sensation/Coordination   Extremity Assessment RUE Assessment RUE Assessment: Within Functional Limits LUE Assessment LUE Assessment: Exceptions to WFL LUE AROM (degrees) Overall AROM Left Upper Extremity: Deficits;Due to premorbid status LUE Overall AROM Comments: Pts elbow contracted to 90 degrees from elbow replacement 20 yrs ago. LUE Strength LUE Overall Strength: Within Functional Limits for tasks assessed (WFL at shoulder, wrist and hand.) Mobility  Bed Mobility Bed Mobility: Yes Sit to Supine: 3: Mod assist;HOB flat Sit to Supine - Details (indicate cue type and reason): A needed for trunk and LEs. Transfers Transfers: Yes Sit to Stand: 3: Mod assist;With upper extremity assist;From chair/3-in-1 Sit to Stand Details (indicate cue type and reason): VCs for hand placement, LE position, correct use of PFRW. Stand to Sit: 4: Min assist;With upper extremity assist;With armrests;To chair/3-in-1;To bed Stand to Sit Details: VCs for hand placement. Exercises   End of Session OT - End of Session Activity Tolerance: Patient tolerated treatment well Patient left: in bed;with call bell in reach General Behavior During Session: [facility] for tasks performed Cognition: [facility] for tasks performed   Sheelah Ritacco A OTR/L [phone] 03/12/2011, 2:53 PM

## 2011-03-12 NOTE — Progress Notes (Signed)
Physical Therapy Treatment Patient Details Name: Jocelyn Bauer MRN: [id number] DOB: 06-30-1928 Today's Date: 03/12/2011  PT Assessment/Plan  PT - Assessment/Plan PT Plan: Discharge plan remains appropriate PT Frequency: 7X/week Recommendations for Other Services: OT consult Follow Up Recommendations: Skilled nursing facility Equipment Recommended: Defer to next venue PT Goals  Acute Rehab PT Goals PT Goal Formulation: With patient Time For Goal Achievement: 7 days Pt will go Supine/Side to Sit: with min assist PT Goal: Supine/Side to Sit - Progress: Goal set today Pt will go Sit to Supine/Side: with min assist PT Goal: Sit to Supine/Side - Progress: Goal set today Pt will go Sit to Stand: with min assist PT Goal: Sit to Stand - Progress: Goal set today Pt will go Stand to Sit: with min assist PT Goal: Stand to Sit - Progress: Goal set today Pt will Ambulate: 16 - 50 feet;with min assist;with rolling walker PT Goal: Ambulate - Progress: Goal set today  PT Treatment Precautions/Restrictions  Precautions Precautions: Posterior Hip Precaution Comments: sign hung in room Restrictions Weight Bearing Restrictions: No Other Position/Activity Restrictions: WBAT Mobility (including Balance) Bed Mobility Bed Mobility: Yes Supine to Sit: 3: Mod assist;2: Max assist Supine to Sit Details (indicate cue type and reason): cues for sequence and use of R LE to self assist - pt with limited use of L UE 2* elbow contracture Sit to Supine: 3: Mod assist;HOB flat Sit to Supine - Details (indicate cue type and reason): A needed for trunk and LEs. Transfers Sit to Stand: 3: Mod assist;With upper extremity assist;From chair/3-in-1 Sit to Stand Details (indicate cue type and reason): VCs for hand placement, LE position, correct use of PFRW. Stand to Sit: 4: Min assist;With upper extremity assist;With armrests;To chair/3-in-1;To bed Stand to Sit Details: VCs for hand  placement. Ambulation/Gait Ambulation/Gait: Yes Ambulation/Gait Assistance: 1: +2 Total assist Ambulation/Gait Assistance Details (indicate cue type and reason): cues for posture and position from RW Ambulation Distance (Feet): 25 Feet (25 and 18') Assistive device: Left platform walker Gait Pattern: Step-to pattern    End of Session PT - End of Session Equipment Utilized During Treatment: Gait belt Activity Tolerance: Patient tolerated treatment well Patient left: in bed;with call bell in reach;with family/visitor present Nurse Communication: Mobility status for transfers;Mobility status for ambulation General Behavior During Session: [facility] for tasks performed Cognition: [facility] for tasks performed  Jocelyn Bauer 03/12/2011, 4:01 PM

## 2011-03-12 NOTE — Progress Notes (Signed)
Patient ID: Jocelyn Bauer, female   DOB: Mar 27, 1928, 78 y.o.   MRN: [id number] Subjective: 1 Day Post-Op Procedure(s) (LRB): TOTAL HIP REVISION (Left)    Patient reports pain as moderate. But sitting up at bedside this am eating her breakfast  Objective:   VITALS:   Filed Vitals:   03/12/11 0745  BP: 141/75  Pulse: 80  Temp: 98.4 F (36.9 C)  Resp: 14    Neurovascular intact Incision: dressing C/D/I  LABS  Basename 03/12/11 0427  HGB 9.9*  HCT 31.4*  WBC 9.0  PLT 392     Basename 03/12/11 0427  NA 135  K 4.4  BUN 10  CREATININE 0.70  GLUCOSE 138*    No results found for this basename: LABPT:2,INR:2 in the last 72 hours   Assessment/Plan: 1 Day Post-Op Procedure(s) (LRB): TOTAL HIP REVISION (Left)   Advance diet Up with therapy D/C IV fluids Discharge to SNF, SW consult placed D/C hemovac today

## 2011-03-12 NOTE — Clinical Documentation Improvement (Signed)
Anemia Blood Loss Clarification  THIS DOCUMENT IS NOT A PERMANENT PART OF THE MEDICAL RECORD  RESPOND TO THE THIS QUERY, FOLLOW THE INSTRUCTIONS BELOW:  1. If needed, update documentation for the patient's encounter via the notes activity.  2. Access this query again and click edit on the In [employer].  3. After updating, or not, click F2 to complete all highlighted (required) fields concerning your review. Select "additional documentation in the medical record" OR "no additional documentation provided".  4. Click Sign note button.  5. The deficiency will fall out of your In Basket *Please let us know if you are not able to complete this workflow by phone or e-mail (listed below).        03/12/11  Dear Dr. Charlann Boxer, Jocelyn Bauer  In an effort to better capture your patient's severity of illness, reflect appropriate length of stay and utilization of resources, a review of the patient medical record has revealed the following indicators.    Based on your clinical judgment, please clarify and document in a progress note and/or discharge summary the clinical condition associated with the following supporting information:  In responding to this query please exercise your independent judgment.  The fact that a query is asked, does not imply that any particular answer is desired or expected.  Pt admitted for Failed Left hip/Revision of hip  According to lab pt's H/H=9.9/31.4 post op.   Please clarify based on abnormal H/H can be further specified as one of the diagnoses listed below and document in pn or d/c summary.   Possible Clinical Conditions?   " Expected Acute Blood Loss Anemia  " Acute Blood Loss Anemia  " Acute on chronic blood loss anemia   " Other Condition________________  " Cannot Clinically Determine  Risk Factors: (recent surgery, pre op anemia, EBL in OR)  Supporting Information: Failed Left hip/Revision of hip  Signs and Symptoms     Diagnostics: Component     Latest Ref Rng 03/12/2011  HGB     12.0 - 15.0 g/dL 9.9 (L)  HCT     16.1 - 46.0 % 31.4 (L)   Treatments:  IV fluids  lactated ringers infusion  Serial H&H monitoring Medications ferrous sulfate tablet 325 mg     Reviewed:  no additional documentation provided ljh  Thank You,  Enis Slipper RN, BSN, CCDS Clinical Documentation Specialist [facility] Dept Pager: [phone] / E-mail: [email or web address]  Health Information Management 

## 2011-03-12 NOTE — Progress Notes (Signed)
FL2 in shadow chart for MD signature. Pt hopes to have ST rehab at [facility] .  SNF will contact CSW with their decision. CSW will follow to assist with SNF placement.

## 2011-03-13 LAB — CBC
HCT: 31.2 % — ABNORMAL LOW (ref 36.0–46.0)
MCH: 28.6 pg (ref 26.0–34.0)
MCHC: 30.8 g/dL (ref 30.0–36.0)
RDW: 16.6 % — ABNORMAL HIGH (ref 11.5–15.5)

## 2011-03-13 LAB — BASIC METABOLIC PANEL
BUN: 12 mg/dL (ref 6–23)
Calcium: 8.3 mg/dL — ABNORMAL LOW (ref 8.4–10.5)
GFR calc Af Amer: >90 mL/min (ref >90)
GFR calc non Af Amer: 80 mL/min — ABNORMAL LOW (ref >90)
Potassium: 4.3 mEq/L (ref 3.5–5.1)

## 2011-03-13 MED ORDER — FERROUS SULFATE 325 (65 FE) MG PO TABS: 325.0000 mg | ORAL_TABLET | Freq: Three times a day (TID) | ORAL | Status: DC

## 2011-03-13 MED ORDER — METHOCARBAMOL 500 MG PO TABS: 500.0000 mg | ORAL_TABLET | Freq: Four times a day (QID) | ORAL | Status: AC | PRN

## 2011-03-13 MED ORDER — DSS 100 MG PO CAPS: 100.0000 mg | ORAL_CAPSULE | Freq: Two times a day (BID) | ORAL | Status: AC

## 2011-03-13 MED ORDER — DIPHENHYDRAMINE HCL 25 MG PO CAPS: 25.0000 mg | ORAL_CAPSULE | Freq: Four times a day (QID) | ORAL | Status: DC | PRN

## 2011-03-13 MED ORDER — POLYETHYLENE GLYCOL 3350 17 G PO PACK: 17.0000 g | PACK | Freq: Two times a day (BID) | ORAL | Status: AC

## 2011-03-13 MED ORDER — HYDROCODONE-ACETAMINOPHEN 7.5-325 MG PO TABS: 1.0000 | ORAL_TABLET | ORAL | Status: AC

## 2011-03-13 MED ORDER — METHOTREXATE SODIUM 2.5 MG PO TABS: 15.0000 mg | ORAL_TABLET | ORAL | Status: DC

## 2011-03-13 MED ORDER — ASPIRIN EC 325 MG PO TBEC: 325.0000 mg | DELAYED_RELEASE_TABLET | Freq: Two times a day (BID) | ORAL | Status: AC

## 2011-03-13 NOTE — Progress Notes (Signed)
Physical Therapy Treatment Patient Details Name: Jocelyn Bauer MRN: [id number] DOB: 07-15-1928 Today's Date: 03/13/2011  PT Assessment/Plan  PT - Assessment/Plan Comments on Treatment Session: Progressing well but continues to be limited by UE weakness and ROM (L>R). PT Plan: Discharge plan remains appropriate PT Frequency: 7X/week Recommendations for Other Services: OT consult Follow Up Recommendations: Skilled nursing facility Equipment Recommended: Defer to next venue PT Goals  Acute Rehab PT Goals Time For Goal Achievement: 7 days Pt will go Supine/Side to Sit: with min assist PT Goal: Supine/Side to Sit - Progress: Progressing toward goal Pt will go Sit to Supine/Side: with min assist PT Goal: Sit to Supine/Side - Progress: Progressing toward goal Pt will go Sit to Stand: with min assist PT Goal: Sit to Stand - Progress: Progressing toward goal Pt will go Stand to Sit: with min assist PT Goal: Stand to Sit - Progress: Progressing toward goal Pt will Ambulate: 16 - 50 feet;with min assist;with rolling walker PT Goal: Ambulate - Progress: Progressing toward goal  PT Treatment Precautions/Restrictions  Precautions Precautions: Posterior Hip Precaution Comments: pt recalls all THP Restrictions Weight Bearing Restrictions: Yes Other Position/Activity Restrictions: PWB Mobility (including Balance) Bed Mobility Supine to Sit: 3: Mod assist Supine to Sit Details (indicate cue type and reason): cues to adhere to THP Transfers Sit to Stand: 3: Mod assist;From bed;With upper extremity assist Sit to Stand Details (indicate cue type and reason): cues for use of R UE  Stand to Sit: 3: Mod assist;With armrests;To chair/3-in-1;With upper extremity assist Stand to Sit Details: cues for LE position and use of R UE Ambulation/Gait Ambulation/Gait Assistance: 3: Mod assist;4: Min assist Ambulation/Gait Assistance Details (indicate cue type and reason): cues for posture, position from RW  and PWB Ambulation Distance (Feet): 48 Feet Assistive device: Left platform walker Gait Pattern: Step-to pattern    Exercise  Total Joint Exercises Ankle Circles/Pumps: AROM;20 reps;Supine;Both Quad Sets: AROM;10 reps;Supine;Both Gluteal Sets: AROM;10 reps;Supine;Both Heel Slides: AAROM;20 reps;Supine;Left Hip ABduction/ADduction: AAROM;20 reps;Left;Supine End of Session PT - End of Session Activity Tolerance: Patient tolerated treatment well Patient left: in chair;with call bell in reach;with family/visitor present General Behavior During Session: [facility] for tasks performed Cognition: [facility] for tasks performed  Jocelyn Bauer 03/13/2011, 12:05 PM

## 2011-03-13 NOTE — Progress Notes (Signed)
Subjective: 2 Days Post-Op Procedure(s) (LRB): TOTAL HIP REVISION (Left)   Patient reports pain as mild. Pain is much better in the hip than it was prior to surgery. No events.   Objective:   VITALS:   Filed Vitals:   03/13/11 0750  BP: 118/76  Pulse: 79  Temp: 98.1 F (36.7 C)  Resp: 14    Neurovascular intact Dorsiflexion/Plantar flexion intact Incision: dressing C/D/I No cellulitis present Compartment soft  LABS  Basename 03/13/11 0430 03/12/11 0427  HGB 9.6* 9.9*  HCT 31.2* 31.4*  WBC 8.0 9.0  PLT 348 392     Basename 03/13/11 0430 03/12/11 0427  NA 140 135  K 4.3 4.4  BUN 12 10  CREATININE 0.70 0.70  GLUCOSE 97 138*     Assessment/Plan: 2 Days Post-Op Procedure(s) (LRB): TOTAL HIP REVISION (Left)   Up with therapy D/C IV fluids Approaching discharge, if continues to do well possibly d/c to SNF on Thursday.   Anastasio Auerbach Joanthan Hlavacek   PAC  03/13/2011, 8:56 AM

## 2011-03-13 NOTE — Discharge Summary (Signed)
Physician Discharge Summary  Patient ID: Jocelyn Bauer MRN: [id number] DOB/AGE: September 23, 1928 78 y.o.  Admit date: 03/11/2011 Discharge date: 03/14/2011  Procedures:  Procedure(s) (LRB): TOTAL HIP REVISION (Left)  Attending Physician: [name], MD   Admission Diagnoses: Loose total hip components, left hip.  Discharge Diagnoses:  Principal Problem:  *S/P revision of left total hip RA HTN Hypothyroidism  HPI: This is an 78 year old lady with a history of total hip arthroplasty on the right with pain and difficulty. Workup including bone scan showed signs compatible with loosening of the prosthesis as well as a poly wear. After discussion of treatments, benefits, risks, options, the patient is now scheduled for revision of both components, total hip of the left hip. The surgery risks, benefits, and aftercare were discussed in detail with the patient. Questions invited and answered. Note that, her medical doctor is Dr. Waynard Edwards, her rheumatologist is Dr. Nickola Major. She stopped her methotrexate 2 weeks before surgery and will not start until 2 weeks after. She is a candidate for tranexamic acid and will receive that at surgery. Also, she is on chronic Coumadin therapy, so she will need steroid boost pre and postop. She is planning [facility] [place] and is not given her home medications.  PCP: Ezequiel Kayser, MD, MD   Discharged Condition: good  Hospital Course:  Patient underwent the above stated procedure on 03/11/2011. Patient tolerated the procedure well and brought to the recovery room in good condition and subsequently to the floor.  POD #1 BP: 141/75 ; Pulse: 80 ; Temp: 98.4 F (36.9 C) ; Resp: 14  Pt's foley was removed, as well as the hemovac drain removed. IV was changed to a saline lock. Patient reports pain as moderate. But sitting up at bedside this am eating her breakfast. Neurovascular intact and incision: dressing  C/D/I.  LABS  Basename  03/12/11 0427   HGB  9.9  HCT  31.4   POD #2  BP: 118/76 ; Pulse: 79 ; Temp: 98.1 F (36.7 C) ; Resp: 14 Patient reports pain as mild. Pain is much better in the hip than it was prior to surgery. No events. Neurovascular intact, dorsiflexion/plantar flexion intact, incision: dressing C/D/I, no cellulitis present and compartment soft.  LABS  Basename  03/13/11 0430   HGB  9.6  HCT  31.2   POD #3  BP: 132/70 ; Pulse: 74 ; Temp: 98 F (36.7 C) ; Resp: 14  Patient reports pain as mild. No events. Ready to be discharged to SNF. Neurovascular intact, dorsiflexion/plantar flexion intact, incision: dressing C/D/I, no cellulitis present and compartment soft.  LABS   No new labs  Discharge Exam: General appearance: alert, cooperative and no distress Extremities: Homans sign is negative, no sign of DVT, no edema, redness or tenderness in the calves or thighs and no ulcers, gangrene or trophic changes  Disposition: SNF with follow up in 2 weeks  Follow-up Information    Follow up with OLIN,Jajuan Skoog D in 2 weeks.   Contact information:   [facility] [address] [place] [address] [phone]          Discharge Orders    Future Orders Please Complete By Expires   Diet - low sodium heart healthy      Call MD / Call 911      Comments:   If you experience chest pain or shortness of breath, CALL 911 and be transported  to the hospital emergency room.  If you develope a fever above 101 F, pus (white drainage) or increased drainage or redness at the wound, or calf pain, call your surgeon's office.   Discharge instructions      Comments:   Maintain surgical dressing for 8 days, then replace with gauze and tape. Keep the area dry and clean until follow up. Follow up in 2 weeks at [facility]. Call with any questions or concerns.     Constipation Prevention      Comments:   Drink plenty of fluids.   Prune juice may be helpful.  You may use a stool softener, such as Colace (over the counter) 100 mg twice a day.  Use MiraLax (over the counter) for constipation as needed.   Increase activity slowly as tolerated      Weight Bearing as taught in Physical Therapy      Comments:   Use a walker or crutches as instructed.   Driving restrictions      Comments:   No driving for 4 weeks   Change dressing      Comments:   Maintain surgical dressing for 8 days, then replace with 4x4 guaze and tape. Keep the area dry and clean.   TED hose      Comments:   Use stockings (TED hose) for 2 weeks on both leg(s).  You may remove them at night for sleeping.      Current Discharge Medication List    START taking these medications   Details  diphenhydrAMINE (BENADRYL) 25 mg capsule Take 1 capsule (25 mg total) by mouth every 6 (six) hours as needed for itching, allergies or sleep.    docusate sodium 100 MG CAPS Take 100 mg by mouth 2 (two) times daily.    ferrous sulfate 325 (65 FE) MG tablet Take 1 tablet (325 mg total) by mouth 3 (three) times daily after meals.    methocarbamol (ROBAXIN) 500 MG tablet Take 1 tablet (500 mg total) by mouth every 6 (six) hours as needed (muscle spasms). Qty: 50 tablet, Refills: 0    polyethylene glycol (MIRALAX / GLYCOLAX) packet Take 17 g by mouth 2 (two) times daily.      CONTINUE these medications which have CHANGED   Details  aspirin EC 325 MG tablet Take 1 tablet (325 mg total) by mouth 2 (two) times daily. X 4 weeks Qty: 60 tablet, Refills: 0    HYDROcodone-acetaminophen (NORCO) 7.5-325 MG per tablet Take 1-2 tablets by mouth every 4 (four) hours. Qty: 120 tablet, Refills: 0    methotrexate 2.5 MG tablet Take 6 tablets (15 mg total) by mouth every 7 (seven) days. Take 6 tabs for 15 mg dose on Monday of each week   Comments: Wait for 2 weeks prior to restarting      CONTINUE these medications which have NOT CHANGED   Details  amLODipine (NORVASC) 5  MG tablet Take 5 mg by mouth every morning.    benazepril (LOTENSIN) 20 MG tablet Take 20 mg by mouth every morning.    folic acid (FOLVITE) 1 MG tablet Take 1 mg by mouth daily.    levothyroxine (SYNTHROID, LEVOTHROID) 100 MCG tablet Take 100 mcg by mouth daily after breakfast.    LORazepam (ATIVAN) 0.5 MG tablet Take 0.125 mg by mouth at bedtime.     Omega-3 Fatty Acids (FISH OIL) 1000 MG CAPS Take 1 capsule by mouth daily.    Pantoprazole Sodium (PROTONIX PO) Take by mouth  every morning.    predniSONE (DELTASONE) 2.5 MG tablet Take 2.5 mg by mouth daily.    Vitamin D, Ergocalciferol, (DRISDOL) 50000 UNITS CAPS Take 50,000 Units by mouth. Monday        Signed:  Anastasio Auerbach. Dorse Locy   PAC  03/14/2011, 7:52 AM

## 2011-03-14 DIAGNOSIS — R269 Unspecified abnormalities of gait and mobility: Secondary | ICD-10-CM | POA: Diagnosis not present

## 2011-03-14 DIAGNOSIS — Z471 Aftercare following joint replacement surgery: Secondary | ICD-10-CM | POA: Diagnosis not present

## 2011-03-14 DIAGNOSIS — Z96649 Presence of unspecified artificial hip joint: Secondary | ICD-10-CM | POA: Diagnosis not present

## 2011-03-14 DIAGNOSIS — M25559 Pain in unspecified hip: Secondary | ICD-10-CM | POA: Diagnosis not present

## 2011-03-14 DIAGNOSIS — Z5189 Encounter for other specified aftercare: Secondary | ICD-10-CM | POA: Diagnosis not present

## 2011-03-14 DIAGNOSIS — S79929A Unspecified injury of unspecified thigh, initial encounter: Secondary | ICD-10-CM | POA: Diagnosis not present

## 2011-03-14 DIAGNOSIS — R262 Difficulty in walking, not elsewhere classified: Secondary | ICD-10-CM | POA: Diagnosis not present

## 2011-03-14 DIAGNOSIS — M069 Rheumatoid arthritis, unspecified: Secondary | ICD-10-CM | POA: Diagnosis not present

## 2011-03-14 DIAGNOSIS — I10 Essential (primary) hypertension: Secondary | ICD-10-CM | POA: Diagnosis not present

## 2011-03-14 DIAGNOSIS — E039 Hypothyroidism, unspecified: Secondary | ICD-10-CM | POA: Diagnosis not present

## 2011-03-14 NOTE — Progress Notes (Signed)
Pt medically cleared for d/c.  Notified [facility], Pt and Care Coordinator, Bonita Quin.  [facility] requesting d/c summary, [facility] and signed FL2.    Faxed d/c summary, [facility] and signed FL2.  Spoke with Ruby in admissions at [facility].  Ruby stated OK to send Pt at this time.  Ruby requesting that family accompany Pt.  Notified Pt of Ruby's request.  Notified Care Coordinator, Bonita Quin that facility ready to receive Pt.  Arranged for transportation.  Pt to be d/c'd.  Providence Crosby, LCSWA Clinical Social Work [phone]

## 2011-03-14 NOTE — Progress Notes (Signed)
Physical Therapy Treatment Patient Details Name: Jocelyn Bauer MRN: [id number] DOB: 04-19-1928 Today's Date: 03/14/2011  PT Assessment/Plan  PT - Assessment/Plan Comments on Treatment Session: Progressing well but continues to be limited by UE weakness and ROM (L>R). PT Plan: Discharge plan remains appropriate PT Frequency: 7X/week Recommendations for Other Services: OT consult Follow Up Recommendations: Skilled nursing facility PT Goals  Acute Rehab PT Goals Time For Goal Achievement: 7 days Pt will go Supine/Side to Sit: with min assist PT Goal: Supine/Side to Sit - Progress: Progressing toward goal Pt will go Sit to Supine/Side: with min assist PT Goal: Sit to Supine/Side - Progress: Progressing toward goal Pt will go Sit to Stand: with min assist PT Goal: Sit to Stand - Progress: Progressing toward goal Pt will go Stand to Sit: with min assist PT Goal: Stand to Sit - Progress: Progressing toward goal Pt will Ambulate: 16 - 50 feet;with min assist;with rolling walker PT Goal: Ambulate - Progress: Progressing toward goal  PT Treatment Precautions/Restrictions  Precautions Precautions: Posterior Hip Precaution Comments: pt recalls all THP Restrictions Weight Bearing Restrictions: Yes LLE Weight Bearing: Partial weight bearing LLE Partial Weight Bearing Percentage or Pounds: 50% Other Position/Activity Restrictions: PWB Mobility (including Balance) Bed Mobility Supine to Sit: 4: Min assist;3: Mod assist Supine to Sit Details (indicate cue type and reason): cues to adhere to THP Transfers Sit to Stand: 4: Min assist;3: Mod assist;From bed;With upper extremity assist Sit to Stand Details (indicate cue type and reason): cues for use of R LE Stand to Sit: 4: Min assist;3: Mod assist;With upper extremity assist;With armrests;To chair/3-in-1 Stand to Sit Details: cues for use of R LE Ambulation/Gait Ambulation/Gait Assistance: 4: Min assist Ambulation/Gait Assistance Details  (indicate cue type and reason): cues to increase WB on L LE Ambulation Distance (Feet): 48 Feet Assistive device: Left platform walker Gait Pattern: Step-to pattern    Exercise  Total Joint Exercises Ankle Circles/Pumps: AROM;20 reps;Supine;Both Quad Sets: AROM;10 reps;Supine;Both Gluteal Sets: AROM;10 reps;Supine;Both Short Arc Quad: AROM;10 reps;5 reps;Supine;Left Heel Slides: AAROM;20 reps;Supine;Left Hip ABduction/ADduction: AAROM;20 reps;Left;Supine End of Session PT - End of Session Activity Tolerance: Patient tolerated treatment well Patient left: in chair;with call bell in reach;with family/visitor present General Behavior During Session: [facility] for tasks performed Cognition: [facility] for tasks performed  Jocelyn Bauer 03/14/2011, 12:06 PM

## 2011-03-14 NOTE — Progress Notes (Signed)
CARE MANAGEMENT NOTE 03/14/2011  Patient:  Jocelyn Bauer, Jocelyn Bauer   Account Number:  [id number]  Date Initiated:  03/13/2011  Documentation initiated by:  Colleen Can  Subjective/Objective Assessment:   DX failed left total hip /Revision total hip     Action/Plan:   ST SNF   Anticipated DC Date:  03/14/2010   Anticipated DC Plan:  SKILLED NURSING FACILITY  In-house referral  Clinical Social Worker      DC Planning Services  CM consult      [facility] Choice  NA   Choice offered to / List presented to:  NA   DME arranged  NA      DME agency  NA     HH arranged  NA      HH agency  NA   Status of service:  Completed, signed off Medicare Important Message given?  NA - LOS <3 / Initial given by admissions (If response is "NO", the following Medicare IM given date fields will be blank) Date Medicare IM given:   Date Additional Medicare IM given:    Discharge Disposition:  SKILLED NURSING FACILITY

## 2011-03-14 NOTE — Progress Notes (Signed)
Subjective: 3 Days Post-Op Procedure(s) (LRB): TOTAL HIP REVISION (Left)   Patient reports pain as mild. No events. Ready to be discharged to SNF.  Objective:   VITALS:   Filed Vitals:   03/14/11 0433  BP: 132/70  Pulse: 74  Temp: 98 F (36.7 C)  Resp: 14    Neurovascular intact Dorsiflexion/Plantar flexion intact Incision: dressing C/D/I No cellulitis present Compartment soft  LABS  Basename 03/13/11 0430 03/12/11 0427  HGB 9.6* 9.9*  HCT 31.2* 31.4*  WBC 8.0 9.0  PLT 348 392     Basename 03/13/11 0430 03/12/11 0427  NA 140 135  K 4.3 4.4  BUN 12 10  CREATININE 0.70 0.70  GLUCOSE 97 138*     Assessment/Plan: 3 Days Post-Op Procedure(s) (LRB): TOTAL HIP REVISION (Left)   Up with therapy Discharge to SNF today after therapy Follow up in 2 weeks at [facility].  Follow-up Information    Follow up with OLIN,Saquoia Sianez D in 2 weeks.   Contact information:   [facility] [address], [address] [place] [address] [phone]          Anastasio Auerbach. Milagros Middendorf   PAC  03/14/2011, 7:50 AM

## 2011-03-15 DIAGNOSIS — M069 Rheumatoid arthritis, unspecified: Secondary | ICD-10-CM | POA: Diagnosis not present

## 2011-03-15 DIAGNOSIS — R269 Unspecified abnormalities of gait and mobility: Secondary | ICD-10-CM | POA: Diagnosis not present

## 2011-03-15 DIAGNOSIS — E039 Hypothyroidism, unspecified: Secondary | ICD-10-CM | POA: Diagnosis not present

## 2011-03-15 DIAGNOSIS — Z471 Aftercare following joint replacement surgery: Secondary | ICD-10-CM | POA: Diagnosis not present

## 2011-04-03 ENCOUNTER — Encounter (HOSPITAL_COMMUNITY): Payer: Self-pay | Admitting: Orthopedic Surgery

## 2011-04-04 DIAGNOSIS — I10 Essential (primary) hypertension: Secondary | ICD-10-CM | POA: Diagnosis not present

## 2011-04-04 DIAGNOSIS — F411 Generalized anxiety disorder: Secondary | ICD-10-CM | POA: Diagnosis not present

## 2011-04-04 DIAGNOSIS — Z96649 Presence of unspecified artificial hip joint: Secondary | ICD-10-CM | POA: Diagnosis not present

## 2011-04-04 DIAGNOSIS — Z4789 Encounter for other orthopedic aftercare: Secondary | ICD-10-CM | POA: Diagnosis not present

## 2011-04-04 DIAGNOSIS — M069 Rheumatoid arthritis, unspecified: Secondary | ICD-10-CM | POA: Diagnosis not present

## 2011-04-08 DIAGNOSIS — I10 Essential (primary) hypertension: Secondary | ICD-10-CM | POA: Diagnosis not present

## 2011-04-08 DIAGNOSIS — Z4789 Encounter for other orthopedic aftercare: Secondary | ICD-10-CM | POA: Diagnosis not present

## 2011-04-08 DIAGNOSIS — F411 Generalized anxiety disorder: Secondary | ICD-10-CM | POA: Diagnosis not present

## 2011-04-08 DIAGNOSIS — Z96649 Presence of unspecified artificial hip joint: Secondary | ICD-10-CM | POA: Diagnosis not present

## 2011-04-08 DIAGNOSIS — M069 Rheumatoid arthritis, unspecified: Secondary | ICD-10-CM | POA: Diagnosis not present

## 2011-04-09 DIAGNOSIS — I10 Essential (primary) hypertension: Secondary | ICD-10-CM | POA: Diagnosis not present

## 2011-04-09 DIAGNOSIS — Z96649 Presence of unspecified artificial hip joint: Secondary | ICD-10-CM | POA: Diagnosis not present

## 2011-04-09 DIAGNOSIS — M069 Rheumatoid arthritis, unspecified: Secondary | ICD-10-CM | POA: Diagnosis not present

## 2011-04-09 DIAGNOSIS — F411 Generalized anxiety disorder: Secondary | ICD-10-CM | POA: Diagnosis not present

## 2011-04-09 DIAGNOSIS — Z4789 Encounter for other orthopedic aftercare: Secondary | ICD-10-CM | POA: Diagnosis not present

## 2011-04-10 DIAGNOSIS — M069 Rheumatoid arthritis, unspecified: Secondary | ICD-10-CM | POA: Diagnosis not present

## 2011-04-10 DIAGNOSIS — I10 Essential (primary) hypertension: Secondary | ICD-10-CM | POA: Diagnosis not present

## 2011-04-10 DIAGNOSIS — Z4789 Encounter for other orthopedic aftercare: Secondary | ICD-10-CM | POA: Diagnosis not present

## 2011-04-10 DIAGNOSIS — F411 Generalized anxiety disorder: Secondary | ICD-10-CM | POA: Diagnosis not present

## 2011-04-10 DIAGNOSIS — Z96649 Presence of unspecified artificial hip joint: Secondary | ICD-10-CM | POA: Diagnosis not present

## 2011-04-16 DIAGNOSIS — Z96649 Presence of unspecified artificial hip joint: Secondary | ICD-10-CM | POA: Diagnosis not present

## 2011-04-16 DIAGNOSIS — F411 Generalized anxiety disorder: Secondary | ICD-10-CM | POA: Diagnosis not present

## 2011-04-16 DIAGNOSIS — Z4789 Encounter for other orthopedic aftercare: Secondary | ICD-10-CM | POA: Diagnosis not present

## 2011-04-16 DIAGNOSIS — I10 Essential (primary) hypertension: Secondary | ICD-10-CM | POA: Diagnosis not present

## 2011-04-16 DIAGNOSIS — M069 Rheumatoid arthritis, unspecified: Secondary | ICD-10-CM | POA: Diagnosis not present

## 2011-04-19 DIAGNOSIS — F411 Generalized anxiety disorder: Secondary | ICD-10-CM | POA: Diagnosis not present

## 2011-04-19 DIAGNOSIS — Z96649 Presence of unspecified artificial hip joint: Secondary | ICD-10-CM | POA: Diagnosis not present

## 2011-04-19 DIAGNOSIS — M069 Rheumatoid arthritis, unspecified: Secondary | ICD-10-CM | POA: Diagnosis not present

## 2011-04-19 DIAGNOSIS — Z4789 Encounter for other orthopedic aftercare: Secondary | ICD-10-CM | POA: Diagnosis not present

## 2011-04-19 DIAGNOSIS — I10 Essential (primary) hypertension: Secondary | ICD-10-CM | POA: Diagnosis not present

## 2011-04-23 DIAGNOSIS — Z4789 Encounter for other orthopedic aftercare: Secondary | ICD-10-CM | POA: Diagnosis not present

## 2011-04-23 DIAGNOSIS — F411 Generalized anxiety disorder: Secondary | ICD-10-CM | POA: Diagnosis not present

## 2011-04-23 DIAGNOSIS — Z96649 Presence of unspecified artificial hip joint: Secondary | ICD-10-CM | POA: Diagnosis not present

## 2011-04-23 DIAGNOSIS — I10 Essential (primary) hypertension: Secondary | ICD-10-CM | POA: Diagnosis not present

## 2011-04-23 DIAGNOSIS — M069 Rheumatoid arthritis, unspecified: Secondary | ICD-10-CM | POA: Diagnosis not present

## 2011-04-25 DIAGNOSIS — M069 Rheumatoid arthritis, unspecified: Secondary | ICD-10-CM | POA: Diagnosis not present

## 2011-04-25 DIAGNOSIS — Z96649 Presence of unspecified artificial hip joint: Secondary | ICD-10-CM | POA: Diagnosis not present

## 2011-04-25 DIAGNOSIS — F411 Generalized anxiety disorder: Secondary | ICD-10-CM | POA: Diagnosis not present

## 2011-04-25 DIAGNOSIS — Z4789 Encounter for other orthopedic aftercare: Secondary | ICD-10-CM | POA: Diagnosis not present

## 2011-04-25 DIAGNOSIS — I10 Essential (primary) hypertension: Secondary | ICD-10-CM | POA: Diagnosis not present

## 2011-04-29 DIAGNOSIS — F411 Generalized anxiety disorder: Secondary | ICD-10-CM | POA: Diagnosis not present

## 2011-04-29 DIAGNOSIS — Z96649 Presence of unspecified artificial hip joint: Secondary | ICD-10-CM | POA: Diagnosis not present

## 2011-04-29 DIAGNOSIS — I10 Essential (primary) hypertension: Secondary | ICD-10-CM | POA: Diagnosis not present

## 2011-04-29 DIAGNOSIS — Z4789 Encounter for other orthopedic aftercare: Secondary | ICD-10-CM | POA: Diagnosis not present

## 2011-04-29 DIAGNOSIS — M069 Rheumatoid arthritis, unspecified: Secondary | ICD-10-CM | POA: Diagnosis not present

## 2011-05-01 DIAGNOSIS — T84019A Broken internal joint prosthesis, unspecified site, initial encounter: Secondary | ICD-10-CM | POA: Diagnosis not present

## 2011-05-02 DIAGNOSIS — F411 Generalized anxiety disorder: Secondary | ICD-10-CM | POA: Diagnosis not present

## 2011-05-02 DIAGNOSIS — I10 Essential (primary) hypertension: Secondary | ICD-10-CM | POA: Diagnosis not present

## 2011-05-02 DIAGNOSIS — Z4789 Encounter for other orthopedic aftercare: Secondary | ICD-10-CM | POA: Diagnosis not present

## 2011-05-02 DIAGNOSIS — Z96649 Presence of unspecified artificial hip joint: Secondary | ICD-10-CM | POA: Diagnosis not present

## 2011-05-02 DIAGNOSIS — M069 Rheumatoid arthritis, unspecified: Secondary | ICD-10-CM | POA: Diagnosis not present

## 2011-05-07 DIAGNOSIS — Z4789 Encounter for other orthopedic aftercare: Secondary | ICD-10-CM | POA: Diagnosis not present

## 2011-05-07 DIAGNOSIS — M069 Rheumatoid arthritis, unspecified: Secondary | ICD-10-CM | POA: Diagnosis not present

## 2011-05-07 DIAGNOSIS — F411 Generalized anxiety disorder: Secondary | ICD-10-CM | POA: Diagnosis not present

## 2011-05-07 DIAGNOSIS — I10 Essential (primary) hypertension: Secondary | ICD-10-CM | POA: Diagnosis not present

## 2011-05-07 DIAGNOSIS — Z96649 Presence of unspecified artificial hip joint: Secondary | ICD-10-CM | POA: Diagnosis not present

## 2011-05-10 DIAGNOSIS — I10 Essential (primary) hypertension: Secondary | ICD-10-CM | POA: Diagnosis not present

## 2011-05-10 DIAGNOSIS — Z96649 Presence of unspecified artificial hip joint: Secondary | ICD-10-CM | POA: Diagnosis not present

## 2011-05-10 DIAGNOSIS — F411 Generalized anxiety disorder: Secondary | ICD-10-CM | POA: Diagnosis not present

## 2011-05-10 DIAGNOSIS — Z4789 Encounter for other orthopedic aftercare: Secondary | ICD-10-CM | POA: Diagnosis not present

## 2011-05-10 DIAGNOSIS — M069 Rheumatoid arthritis, unspecified: Secondary | ICD-10-CM | POA: Diagnosis not present

## 2011-05-14 DIAGNOSIS — I10 Essential (primary) hypertension: Secondary | ICD-10-CM | POA: Diagnosis not present

## 2011-05-14 DIAGNOSIS — F411 Generalized anxiety disorder: Secondary | ICD-10-CM | POA: Diagnosis not present

## 2011-05-14 DIAGNOSIS — M069 Rheumatoid arthritis, unspecified: Secondary | ICD-10-CM | POA: Diagnosis not present

## 2011-05-14 DIAGNOSIS — Z4789 Encounter for other orthopedic aftercare: Secondary | ICD-10-CM | POA: Diagnosis not present

## 2011-05-14 DIAGNOSIS — Z96649 Presence of unspecified artificial hip joint: Secondary | ICD-10-CM | POA: Diagnosis not present

## 2011-05-15 DIAGNOSIS — F411 Generalized anxiety disorder: Secondary | ICD-10-CM | POA: Diagnosis not present

## 2011-05-15 DIAGNOSIS — I10 Essential (primary) hypertension: Secondary | ICD-10-CM | POA: Diagnosis not present

## 2011-05-15 DIAGNOSIS — M069 Rheumatoid arthritis, unspecified: Secondary | ICD-10-CM | POA: Diagnosis not present

## 2011-05-15 DIAGNOSIS — Z96649 Presence of unspecified artificial hip joint: Secondary | ICD-10-CM | POA: Diagnosis not present

## 2011-05-15 DIAGNOSIS — Z4789 Encounter for other orthopedic aftercare: Secondary | ICD-10-CM | POA: Diagnosis not present

## 2011-05-21 DIAGNOSIS — M810 Age-related osteoporosis without current pathological fracture: Secondary | ICD-10-CM | POA: Diagnosis not present

## 2011-05-21 DIAGNOSIS — M069 Rheumatoid arthritis, unspecified: Secondary | ICD-10-CM | POA: Diagnosis not present

## 2011-05-21 DIAGNOSIS — M2550 Pain in unspecified joint: Secondary | ICD-10-CM | POA: Diagnosis not present

## 2011-05-21 DIAGNOSIS — Z79899 Other long term (current) drug therapy: Secondary | ICD-10-CM | POA: Diagnosis not present

## 2011-07-02 DIAGNOSIS — L84 Corns and callosities: Secondary | ICD-10-CM | POA: Diagnosis not present

## 2011-07-02 DIAGNOSIS — I10 Essential (primary) hypertension: Secondary | ICD-10-CM | POA: Diagnosis not present

## 2011-07-02 DIAGNOSIS — B070 Plantar wart: Secondary | ICD-10-CM | POA: Diagnosis not present

## 2011-07-16 DIAGNOSIS — D1801 Hemangioma of skin and subcutaneous tissue: Secondary | ICD-10-CM | POA: Diagnosis not present

## 2011-07-16 DIAGNOSIS — Z85828 Personal history of other malignant neoplasm of skin: Secondary | ICD-10-CM | POA: Diagnosis not present

## 2011-07-19 DIAGNOSIS — M2040 Other hammer toe(s) (acquired), unspecified foot: Secondary | ICD-10-CM | POA: Diagnosis not present

## 2011-07-19 DIAGNOSIS — M2010 Hallux valgus (acquired), unspecified foot: Secondary | ICD-10-CM | POA: Diagnosis not present

## 2011-07-19 DIAGNOSIS — M216X9 Other acquired deformities of unspecified foot: Secondary | ICD-10-CM | POA: Diagnosis not present

## 2011-07-19 DIAGNOSIS — Q828 Other specified congenital malformations of skin: Secondary | ICD-10-CM | POA: Diagnosis not present

## 2011-07-30 DIAGNOSIS — M810 Age-related osteoporosis without current pathological fracture: Secondary | ICD-10-CM | POA: Diagnosis not present

## 2011-07-30 DIAGNOSIS — Z79899 Other long term (current) drug therapy: Secondary | ICD-10-CM | POA: Diagnosis not present

## 2011-08-04 ENCOUNTER — Encounter (HOSPITAL_COMMUNITY): Payer: Self-pay | Admitting: Emergency Medicine

## 2011-08-04 ENCOUNTER — Emergency Department (HOSPITAL_COMMUNITY)
Admission: EM | Admit: 2011-08-04 | Discharge: 2011-08-04 | Disposition: A | Discharge status: Home / Self Care | Payer: Medicare Other | Attending: Emergency Medicine | Admitting: Emergency Medicine

## 2011-08-04 DIAGNOSIS — E039 Hypothyroidism, unspecified: Secondary | ICD-10-CM | POA: Insufficient documentation

## 2011-08-04 DIAGNOSIS — M2550 Pain in unspecified joint: Secondary | ICD-10-CM | POA: Insufficient documentation

## 2011-08-04 DIAGNOSIS — I10 Essential (primary) hypertension: Secondary | ICD-10-CM | POA: Insufficient documentation

## 2011-08-04 DIAGNOSIS — E785 Hyperlipidemia, unspecified: Secondary | ICD-10-CM | POA: Diagnosis not present

## 2011-08-04 DIAGNOSIS — K219 Gastro-esophageal reflux disease without esophagitis: Secondary | ICD-10-CM | POA: Insufficient documentation

## 2011-08-04 DIAGNOSIS — Z87891 Personal history of nicotine dependence: Secondary | ICD-10-CM | POA: Diagnosis not present

## 2011-08-04 DIAGNOSIS — R002 Palpitations: Secondary | ICD-10-CM | POA: Diagnosis not present

## 2011-08-04 DIAGNOSIS — F1123 Opioid dependence with withdrawal: Secondary | ICD-10-CM

## 2011-08-04 LAB — URINALYSIS, ROUTINE W REFLEX MICROSCOPIC
Ketones, ur: NEGATIVE mg/dL
Leukocytes, UA: NEGATIVE
Nitrite: NEGATIVE
Specific Gravity, Urine: 1.009 (ref 1.005–1.030)
pH: 7.5 (ref 5.0–8.0)

## 2011-08-04 LAB — CBC WITH DIFFERENTIAL/PLATELET
Basophils Absolute: 0 10*3/uL (ref 0.0–0.1)
Basophils Relative: 1 % (ref 0–1)
Hemoglobin: 11.7 g/dL — ABNORMAL LOW (ref 12.0–15.0)
Lymphocytes Relative: 15 % (ref 12–46)
MCHC: 31.9 g/dL (ref 30.0–36.0)
Neutro Abs: 6.6 10*3/uL (ref 1.7–7.7)
Neutrophils Relative %: 76 % (ref 43–77)
RDW: 19.1 % — ABNORMAL HIGH (ref 11.5–15.5)
WBC: 8.7 10*3/uL (ref 4.0–10.5)

## 2011-08-04 LAB — BASIC METABOLIC PANEL
CO2: 27 mEq/L (ref 19–32)
Chloride: 105 mEq/L (ref 96–112)
GFR calc Af Amer: >90 mL/min (ref >90)
Potassium: 4.1 mEq/L (ref 3.5–5.1)
Sodium: 144 mEq/L (ref 135–145)

## 2011-08-04 MED ORDER — OXYCODONE-ACETAMINOPHEN 5-325 MG PO TABS
1.0000 | ORAL_TABLET | ORAL | Status: AC
  Administered 2011-08-04: 1 via ORAL
  Filled 2011-08-04: qty 1

## 2011-08-04 MED ORDER — OXYCODONE-ACETAMINOPHEN 5-325 MG PO TABS: 1.0000 | ORAL_TABLET | Freq: Three times a day (TID) | ORAL | Status: AC | PRN

## 2011-08-04 MED ORDER — PREDNISONE 5 MG PO TABS
5.0000 mg | ORAL_TABLET | Freq: Once | ORAL | Status: AC
  Administered 2011-08-04: 5 mg via ORAL
  Filled 2011-08-04: qty 1

## 2011-08-04 MED ORDER — PREDNISONE 10 MG PO TABS: 5.0000 mg | ORAL_TABLET | Freq: Every day | ORAL | Status: DC

## 2011-08-04 NOTE — ED Provider Notes (Signed)
History     CSN: [id number]  Arrival date & time 08/04/11  4098   First MD Initiated Contact with Patient 08/04/11 1024      Chief Complaint  Patient presents with  . Joint Pain  . Palpitations    (Consider location/radiation/quality/duration/timing/severity/associated sxs/prior treatment) HPI  Past Medical History  Diagnosis Date  . Hypertension   . Shortness of breath     occas  . Arthritis     RA  . Hypothyroidism   . Hyperlipidemia   . GERD (gastroesophageal reflux disease)   . Anxiety     HX OF PANIC ATTACKS    Past Surgical History  Procedure Date  . Joint replacement 1998    left hip replacement and bilateral elbow replacements  about 15 yrs ago  . Thyroidectomy   . Total hip revision 03/11/2011    Procedure: TOTAL HIP REVISION;  Surgeon: [name], MD;  Location: [facility] ORS;  Service: Orthopedics;  Laterality: Left;  . Total hip arthroplasty     No family history on file.  History  Substance Use Topics  . Smoking status: Former Games developer  . Smokeless tobacco: Never Used   Comment: QUIT SMOKING IN THE 70"S  . Alcohol Use: No    OB History    Grav Para Term Preterm Abortions TAB SAB Ect Mult Living                  Review of Systems  Allergies  Contrast media and Dilaudid  Home Medications   Current Outpatient Rx  Name Route Sig Dispense Refill  . AMLODIPINE BESYLATE 5 MG PO TABS Oral Take 5 mg by mouth every morning.    Marland Kitchen BENAZEPRIL HCL 20 MG PO TABS Oral Take 20 mg by mouth every morning.    Marland Kitchen ALLEGRA PO Oral Take 1 tablet by mouth daily as needed. For allergies    . FOLIC ACID 1 MG PO TABS Oral Take 1 mg by mouth daily.    Marland Kitchen LEVOTHYROXINE SODIUM 100 MCG PO TABS Oral Take 100 mcg by mouth daily after breakfast.    . LORAZEPAM 0.5 MG PO TABS Oral Take 0.25 mg by mouth at bedtime.     Marland Kitchen LORAZEPAM 0.5 MG PO TABS Oral Take 0.5 mg by mouth once.    . METHOTREXATE 2.5 MG PO TABS Oral Take 2.515 mg by mouth once a week. Caution:Chemotherapy.  Protect from light.  mondays    . FISH OIL 1000 MG PO CAPS Oral Take 1 capsule by mouth daily.    Marland Kitchen PANTOPRAZOLE SODIUM 40 MG PO TBEC Oral Take 40 mg by mouth daily.    Marland Kitchen PREDNISONE 2.5 MG PO TABS Oral Take 2.5 mg by mouth daily.    Marland Kitchen VITAMIN D (ERGOCALCIFEROL) 50000 UNITS PO CAPS Oral Take 50,000 Units by mouth. Monday      BP 123/64  Pulse 88  Temp 98.5 F (36.9 C) (Oral)  Resp 16  SpO2 96%  Physical Exam  ED Course  Procedures (including critical care time)  Labs Reviewed  CBC WITH DIFFERENTIAL - Abnormal; Notable for the following:    Hemoglobin 11.7 (*)     RDW 19.1 (*)     All other components within normal limits  BASIC METABOLIC PANEL - Abnormal; Notable for the following:    Glucose, Bld 112 (*)     GFR calc non Af Amer 81 (*)     All other components within normal limits  URINALYSIS, ROUTINE W REFLEX  MICROSCOPIC  URINE CULTURE   No results found.   No diagnosis found.    MDM  Pt is being followed by PA. [facility].  I have also obtained hx and performed exam.  Pt has run out of prednisone and pain med and presented c/o feeling ill.  She also c/o joint pain.  On her initial evaluation she was noted to have significantly elevated HR.  This has since resolved with treatment of pain.  Plan review labs as available, refill medications, provide appropriate disposition.        Tobin Chad, MD 08/04/11 1258

## 2011-08-04 NOTE — ED Notes (Signed)
Pt presents to department for evaluation of join pain all over body. States she ran out of percocet on Friday. Also states she felt her heart "racing" when she got up to use restroom earlier today. Denies chest pain at the time. Respirations unlabored. 9/10 pain at present. No signs of distress.

## 2011-08-04 NOTE — ED Notes (Signed)
Pt. Stated, I've ran out of my pain pills which i take all the time.  I took 2 Tylenols and it feels like my heart is racing.

## 2011-08-04 NOTE — ED Provider Notes (Signed)
History     CSN: [id number]  Arrival date & time 08/04/11  6578   First MD Initiated Contact with Patient 08/04/11 1024      Chief Complaint  Patient presents with  . Joint Pain  . Palpitations    (Consider location/radiation/quality/duration/timing/severity/associated sxs/prior treatment) HPI Comments: Patient with hx rheumatoid arthritis reports she has been out of her percocet and prednisone for 3 days.  Woke up this morning feeling weak all over, occipital headache, and palpitations.  States she felt her heart was racing.  She took 500mg  of tylenol without improvement.  Has had increased number of bowel movements recently.  Denies fever, chills, CP, SOB, syncope, abdominal pain, N/V, urinary symptoms, focal neurological deficits.  Denies recent falls or illness.  She is eating and drinking well.    Patient is a 78 y.o. female presenting with palpitations. The history is provided by the patient.  Palpitations  Pertinent negatives include no fever, no numbness, no chest pain, no abdominal pain, no nausea, no vomiting, no weakness and no shortness of breath.    Past Medical History  Diagnosis Date  . Hypertension   . Shortness of breath     occas  . Arthritis     RA  . Hypothyroidism   . Hyperlipidemia   . GERD (gastroesophageal reflux disease)   . Anxiety     HX OF PANIC ATTACKS    Past Surgical History  Procedure Date  . Joint replacement 1998    left hip replacement and bilateral elbow replacements  about 15 yrs ago  . Thyroidectomy   . Total hip revision 03/11/2011    Procedure: TOTAL HIP REVISION;  Surgeon: [name], MD;  Location: [facility];  Service: Orthopedics;  Laterality: Left;  . Total hip arthroplasty     No family history on file.  History  Substance Use Topics  . Smoking status: Former Games developer  . Smokeless tobacco: Never Used   Comment: QUIT SMOKING IN THE 70"S  . Alcohol Use: No    OB History    Grav Para Term Preterm Abortions TAB SAB Ect  Mult Living                  Review of Systems  Constitutional: Positive for fatigue. Negative for fever and chills.  Respiratory: Negative for shortness of breath.   Cardiovascular: Positive for palpitations. Negative for chest pain.  Gastrointestinal: Negative for nausea, vomiting and abdominal pain.  Genitourinary: Negative for dysuria, urgency and frequency.  Neurological: Negative for syncope, weakness, light-headedness and numbness.    Allergies  Contrast media and Dilaudid  Home Medications   Current Outpatient Rx  Name Route Sig Dispense Refill  . AMLODIPINE BESYLATE 5 MG PO TABS Oral Take 5 mg by mouth every morning.    Marland Kitchen BENAZEPRIL HCL 20 MG PO TABS Oral Take 20 mg by mouth every morning.    Marland Kitchen ALLEGRA PO Oral Take 1 tablet by mouth daily as needed. For allergies    . FOLIC ACID 1 MG PO TABS Oral Take 1 mg by mouth daily.    Marland Kitchen LEVOTHYROXINE SODIUM 100 MCG PO TABS Oral Take 100 mcg by mouth daily after breakfast.    . LORAZEPAM 0.5 MG PO TABS Oral Take 0.25 mg by mouth at bedtime.     Marland Kitchen LORAZEPAM 0.5 MG PO TABS Oral Take 0.5 mg by mouth once.    . METHOTREXATE 2.5 MG PO TABS Oral Take 2.515 mg by mouth once a week.  Caution:Chemotherapy. Protect from light.  mondays    . FISH OIL 1000 MG PO CAPS Oral Take 1 capsule by mouth daily.    Marland Kitchen PANTOPRAZOLE SODIUM 40 MG PO TBEC Oral Take 40 mg by mouth daily.    Marland Kitchen PREDNISONE 2.5 MG PO TABS Oral Take 2.5 mg by mouth daily.    Marland Kitchen VITAMIN D (ERGOCALCIFEROL) 50000 UNITS PO CAPS Oral Take 50,000 Units by mouth. Monday      BP 141/68  Pulse 98  Temp 97.5 F (36.4 C)  Resp 16  SpO2 99%  Physical Exam  Nursing note and vitals reviewed. Constitutional: She appears well-developed and well-nourished. No distress.  HENT:  Head: Normocephalic and atraumatic.  Neck: Normal range of motion. Neck supple.  Cardiovascular: Normal rate and regular rhythm.   Pulmonary/Chest: Effort normal and breath sounds normal. No respiratory distress.  She has no wheezes. She has no rales. She exhibits no tenderness.  Abdominal: Soft. She exhibits no distension and no mass. There is no tenderness. There is no rebound and no guarding.  Neurological: She is alert. She has normal strength. She is not disoriented. No cranial nerve deficit or sensory deficit. She exhibits normal muscle tone. GCS eye subscore is 4. GCS verbal subscore is 5. GCS motor subscore is 6.  Skin: She is not diaphoretic.    ED Course  Procedures (including critical care time)  Labs Reviewed  CBC WITH DIFFERENTIAL - Abnormal; Notable for the following:    Hemoglobin 11.7 (*)     RDW 19.1 (*)     All other components within normal limits  BASIC METABOLIC PANEL - Abnormal; Notable for the following:    Glucose, Bld 112 (*)     GFR calc non Af Amer 81 (*)     All other components within normal limits  URINALYSIS, ROUTINE W REFLEX MICROSCOPIC  URINE CULTURE   No results found.   Date: 08/04/2011  Rate: 102  Rhythm: sinus tachycardia  QRS Axis: normal  Intervals: normal  ST/T Wave abnormalities: nonspecific T wave changes  Conduction Disutrbances:none  Narrative Interpretation:   Old EKG Reviewed: unchanged  12:02 PM Discussed patient with Dr Lorenso Courier who will also see the patient.   Filed Vitals:   08/04/11 1140  BP: 123/64  Pulse: 88  Temp: 98.5 F (36.9 C)  Resp:      1. Narcotic withdrawal       MDM  Pt with hx rheumatoid arthritis on daily prednisone and percocet has been out of her medications x 3 days, woke up this morning with generalized weakness, palpitations, and headache.  Pt denied any CP, SOB.  I suspect she may be in early withdrawal from her medications.  Pt feeling much better after percocet and prednisone given.  Labs, urinalysis unremarkable.  Discussed again with Dr Lorenso Courier who agrees with plan.  Pt d/c home with prescriptions for percocet and prednisone, PCP follow up.  Discussed all results with patient.  Pt given return  precautions.  Pt verbalizes understanding and agrees with plan.           [place], [place] 08/04/11 [phone]

## 2011-08-04 NOTE — ED Notes (Signed)
Pt up to the bathroom with assistance. Tolerated without difficulty. Family at bedside. No signs of distress noted. Vital signs stable.

## 2011-08-05 LAB — URINE CULTURE

## 2011-08-13 DIAGNOSIS — E785 Hyperlipidemia, unspecified: Secondary | ICD-10-CM | POA: Diagnosis not present

## 2011-08-13 DIAGNOSIS — E039 Hypothyroidism, unspecified: Secondary | ICD-10-CM | POA: Diagnosis not present

## 2011-08-13 DIAGNOSIS — I10 Essential (primary) hypertension: Secondary | ICD-10-CM | POA: Diagnosis not present

## 2011-08-13 DIAGNOSIS — M069 Rheumatoid arthritis, unspecified: Secondary | ICD-10-CM | POA: Diagnosis not present

## 2011-08-23 DIAGNOSIS — H1045 Other chronic allergic conjunctivitis: Secondary | ICD-10-CM | POA: Diagnosis not present

## 2011-08-30 DIAGNOSIS — M2550 Pain in unspecified joint: Secondary | ICD-10-CM | POA: Diagnosis not present

## 2011-08-30 DIAGNOSIS — M069 Rheumatoid arthritis, unspecified: Secondary | ICD-10-CM | POA: Diagnosis not present

## 2011-08-30 DIAGNOSIS — Z79899 Other long term (current) drug therapy: Secondary | ICD-10-CM | POA: Diagnosis not present

## 2011-08-30 DIAGNOSIS — M810 Age-related osteoporosis without current pathological fracture: Secondary | ICD-10-CM | POA: Diagnosis not present

## 2011-09-05 DIAGNOSIS — T84019A Broken internal joint prosthesis, unspecified site, initial encounter: Secondary | ICD-10-CM | POA: Diagnosis not present

## 2011-09-19 DIAGNOSIS — H40019 Open angle with borderline findings, low risk, unspecified eye: Secondary | ICD-10-CM | POA: Diagnosis not present

## 2011-09-24 DIAGNOSIS — M069 Rheumatoid arthritis, unspecified: Secondary | ICD-10-CM | POA: Diagnosis not present

## 2011-10-22 DIAGNOSIS — Z23 Encounter for immunization: Secondary | ICD-10-CM | POA: Diagnosis not present

## 2011-11-28 DIAGNOSIS — M810 Age-related osteoporosis without current pathological fracture: Secondary | ICD-10-CM | POA: Diagnosis not present

## 2011-11-28 DIAGNOSIS — M2550 Pain in unspecified joint: Secondary | ICD-10-CM | POA: Diagnosis not present

## 2011-11-28 DIAGNOSIS — Z79899 Other long term (current) drug therapy: Secondary | ICD-10-CM | POA: Diagnosis not present

## 2011-11-28 DIAGNOSIS — IMO0001 Reserved for inherently not codable concepts without codable children: Secondary | ICD-10-CM | POA: Diagnosis not present

## 2011-11-28 DIAGNOSIS — M069 Rheumatoid arthritis, unspecified: Secondary | ICD-10-CM | POA: Diagnosis not present

## 2012-01-31 DIAGNOSIS — L84 Corns and callosities: Secondary | ICD-10-CM | POA: Diagnosis not present

## 2012-02-10 DIAGNOSIS — I10 Essential (primary) hypertension: Secondary | ICD-10-CM | POA: Diagnosis not present

## 2012-02-10 DIAGNOSIS — M069 Rheumatoid arthritis, unspecified: Secondary | ICD-10-CM | POA: Diagnosis not present

## 2012-02-10 DIAGNOSIS — E039 Hypothyroidism, unspecified: Secondary | ICD-10-CM | POA: Diagnosis not present

## 2012-02-10 DIAGNOSIS — E785 Hyperlipidemia, unspecified: Secondary | ICD-10-CM | POA: Diagnosis not present

## 2012-02-10 DIAGNOSIS — R0602 Shortness of breath: Secondary | ICD-10-CM | POA: Diagnosis not present

## 2012-02-10 DIAGNOSIS — Z79899 Other long term (current) drug therapy: Secondary | ICD-10-CM | POA: Diagnosis not present

## 2012-02-15 ENCOUNTER — Emergency Department (HOSPITAL_COMMUNITY)
Admission: EM | Admit: 2012-02-15 | Discharge: 2012-02-15 | Disposition: A | Discharge status: Home / Self Care | Payer: Medicare Other | Attending: Emergency Medicine | Admitting: Emergency Medicine

## 2012-02-15 ENCOUNTER — Encounter (HOSPITAL_COMMUNITY): Payer: Self-pay | Admitting: Emergency Medicine

## 2012-02-15 ENCOUNTER — Emergency Department (HOSPITAL_COMMUNITY): Payer: Medicare Other

## 2012-02-15 DIAGNOSIS — Z7982 Long term (current) use of aspirin: Secondary | ICD-10-CM | POA: Diagnosis not present

## 2012-02-15 DIAGNOSIS — E785 Hyperlipidemia, unspecified: Secondary | ICD-10-CM | POA: Diagnosis not present

## 2012-02-15 DIAGNOSIS — I10 Essential (primary) hypertension: Secondary | ICD-10-CM | POA: Diagnosis not present

## 2012-02-15 DIAGNOSIS — K219 Gastro-esophageal reflux disease without esophagitis: Secondary | ICD-10-CM | POA: Insufficient documentation

## 2012-02-15 DIAGNOSIS — Z79899 Other long term (current) drug therapy: Secondary | ICD-10-CM | POA: Insufficient documentation

## 2012-02-15 DIAGNOSIS — J3489 Other specified disorders of nose and nasal sinuses: Secondary | ICD-10-CM | POA: Insufficient documentation

## 2012-02-15 DIAGNOSIS — E039 Hypothyroidism, unspecified: Secondary | ICD-10-CM | POA: Diagnosis not present

## 2012-02-15 DIAGNOSIS — Z8739 Personal history of other diseases of the musculoskeletal system and connective tissue: Secondary | ICD-10-CM | POA: Diagnosis not present

## 2012-02-15 DIAGNOSIS — R51 Headache: Secondary | ICD-10-CM | POA: Insufficient documentation

## 2012-02-15 DIAGNOSIS — F411 Generalized anxiety disorder: Secondary | ICD-10-CM | POA: Insufficient documentation

## 2012-02-15 DIAGNOSIS — R42 Dizziness and giddiness: Secondary | ICD-10-CM | POA: Diagnosis not present

## 2012-02-15 DIAGNOSIS — Z87891 Personal history of nicotine dependence: Secondary | ICD-10-CM | POA: Insufficient documentation

## 2012-02-15 DIAGNOSIS — R4182 Altered mental status, unspecified: Secondary | ICD-10-CM | POA: Diagnosis not present

## 2012-02-15 MED ORDER — AMOXICILLIN 500 MG PO CAPS: 1000.0000 mg | ORAL_CAPSULE | Freq: Two times a day (BID) | ORAL | Status: DC

## 2012-02-15 MED ORDER — PREDNISONE 5 MG PO TABS: 5.0000 mg | ORAL_TABLET | Freq: Every day | ORAL | Status: DC

## 2012-02-15 MED ORDER — KETOROLAC TROMETHAMINE 60 MG/2ML IM SOLN
30.0000 mg | Freq: Once | INTRAMUSCULAR | Status: AC
  Administered 2012-02-15: 60 mg via INTRAMUSCULAR
  Filled 2012-02-15: qty 2

## 2012-02-15 NOTE — ED Notes (Signed)
NAD noted at time of d/c home 

## 2012-02-15 NOTE — ED Notes (Signed)
Pt c/o headache since Monday. Pt seen by PMD on Monday and given prescribed medications. Pt reports not feeling any better.

## 2012-02-15 NOTE — ED Provider Notes (Signed)
History     CSN: [id number]  Arrival date & time 02/15/12  1235   First MD Initiated Contact with Patient 02/15/12 1424      Chief Complaint  Patient presents with  . Headache    (Consider location/radiation/quality/duration/timing/severity/associated sxs/prior treatment) Patient is a 79 y.o. female presenting with headaches. The history is provided by the patient and a relative. No language interpreter was used.  Headache Pain location:  Generalized Radiates to:  Does not radiate Severity currently:  5/10 Severity at highest:  8/10 Onset quality:  Gradual Duration:  5 days Progression:  Waxing and waning Chronicity:  Recurrent Context: activity   Context: not exposure to bright light, not coughing, not stress and not loud noise   Relieved by:  Nothing Ineffective treatments:  Prescription medications Associated symptoms: no abdominal pain, no back pain, no blurred vision, no cough, no dizziness, no pain, no fever, no focal weakness, no loss of balance, no nausea, no near-syncope, no numbness, no paresthesias, no photophobia, no seizures, no sore throat, no URI, no visual change and no vomiting   Associated symptoms comment:  Maxillary sinus pain 79 yo female with c/o generalized h/a x 6 days that waxes and wanes.  Took percocet for pain with mild relief.  No neck pain, fever, nausea , vomiting, photophobia or phonophobia.    Past Medical History  Diagnosis Date  . Hypertension   . Shortness of breath     occas  . Arthritis     RA  . Hypothyroidism   . Hyperlipidemia   . GERD (gastroesophageal reflux disease)   . Anxiety     HX OF PANIC ATTACKS    Past Surgical History  Procedure Laterality Date  . Joint replacement  1998    left hip replacement and bilateral elbow replacements  about 15 yrs ago  . Thyroidectomy    . Total hip revision  03/11/2011    Procedure: TOTAL HIP REVISION;  Surgeon: [name], MD;  Location: [facility];  Service: Orthopedics;  Laterality:  Left;  . Total hip arthroplasty      No family history on file.  History  Substance Use Topics  . Smoking status: Former Games developer  . Smokeless tobacco: Never Used     Comment: QUIT SMOKING IN THE 70"S  . Alcohol Use: No    OB History   Grav Para Term Preterm Abortions TAB SAB Ect Mult Living                  Review of Systems  Constitutional: Negative.  Negative for fever.  HENT: Negative for sore throat.   Eyes: Negative.  Negative for blurred vision, photophobia and pain.  Respiratory: Negative.  Negative for cough.   Cardiovascular: Negative.  Negative for near-syncope.  Gastrointestinal: Negative.  Negative for nausea, vomiting and abdominal pain.  Musculoskeletal: Negative for back pain and gait problem.  Neurological: Positive for headaches. Negative for dizziness, focal weakness, seizures, facial asymmetry, speech difficulty, weakness, light-headedness, numbness, paresthesias and loss of balance.  Psychiatric/Behavioral: Negative.   All other systems reviewed and are negative.    Allergies  Contrast media and Dilaudid  Home Medications   Current Outpatient Rx  Name  Route  Sig  Dispense  Refill  . amLODipine (NORVASC) 5 MG tablet   Oral   Take 5 mg by mouth every morning.         Marland Kitchen aspirin EC 81 MG tablet   Oral   Take 81 mg by  mouth daily.         . benazepril (LOTENSIN) 20 MG tablet   Oral   Take 20 mg by mouth every morning.         . fexofenadine (ALLEGRA) 180 MG tablet   Oral   Take 180 mg by mouth daily.         . folic acid (FOLVITE) 1 MG tablet   Oral   Take 1 mg by mouth daily.         Marland Kitchen levothyroxine (SYNTHROID, LEVOTHROID) 150 MCG tablet   Oral   Take 150 mcg by mouth daily.         Marland Kitchen LORazepam (ATIVAN) 0.5 MG tablet   Oral   Take 0.25 mg by mouth at bedtime.          . methotrexate (RHEUMATREX) 2.5 MG tablet   Oral   Take 15 mg by mouth once a week. Caution:Chemotherapy. Protect from light.  mondays         .  Omega-3 Fatty Acids (FISH OIL) 1000 MG CAPS   Oral   Take 1 capsule by mouth daily.         Marland Kitchen omeprazole (PRILOSEC) 20 MG capsule   Oral   Take 20 mg by mouth daily.         Marland Kitchen oxyCODONE-acetaminophen (PERCOCET/ROXICET) 5-325 MG per tablet   Oral   Take 1 tablet by mouth every 4 (four) hours as needed for pain. For pain         . predniSONE (DELTASONE) 2.5 MG tablet   Oral   Take 2.5 mg by mouth daily.         . Vitamin D, Ergocalciferol, (DRISDOL) 50000 UNITS CAPS   Oral   Take 50,000 Units by mouth. Monday           BP 152/70  Pulse 118  Temp(Src) 98.2 F (36.8 C) (Oral)  Resp 20  SpO2 96%  Physical Exam  Nursing note and vitals reviewed. Constitutional: She is oriented to person, place, and time. She appears well-developed and well-nourished.  HENT:  Head: Normocephalic and atraumatic.  Eyes: Conjunctivae and EOM are normal. Pupils are equal, round, and reactive to light.  Neck: Normal range of motion. Neck supple.  Cardiovascular: Normal rate.   Pulmonary/Chest: Effort normal.  Abdominal: Soft.  Musculoskeletal: Normal range of motion. She exhibits no edema and no tenderness.  Neurological: She is alert and oriented to person, place, and time. She has normal reflexes. She displays no tremor and normal reflexes. No cranial nerve deficit or sensory deficit. She exhibits normal muscle tone. Coordination normal. GCS eye subscore is 4. GCS verbal subscore is 5. GCS motor subscore is 6.  +coordination, no vision problems steady gait  Skin: Skin is warm and dry.  Psychiatric: She has a normal mood and affect.    ED Course  Procedures (including critical care time)  Labs Reviewed - No data to display Ct Head Wo Contrast  02/15/2012  *RADIOLOGY REPORT*  Clinical Data: Headache, dizziness, altered mental.  CT HEAD WITHOUT CONTRAST  Technique:  Contiguous axial images were obtained from the base of the skull through the vertex without contrast.  Comparison: This  head CT 11/25/2007, MRI 01/15/2007  Findings: No acute intracranial hemorrhage.  No focal mass lesion. No CT evidence of acute infarction.   No midline shift or mass effect.  No hydrocephalus.  Basilar cisterns are patent. Paranasal sinuses and mastoid air cells are clear.  Orbits are normal.  There are mild deep white matter hypodensities.  IMPRESSION:  3.  No acute intracranial findings.  No change from prior. 2.  Mild microvascular disease.   Original Report Authenticated By: Genevive Bi, M.D.      No diagnosis found.    MDM  H/a maxillary sinus pain.  Neuro in tact.  CT head unremarkable for acute findings.  rx for amoxicillin if not better in 24 hours.  Follow up with pcp Monday.  Return for worsening symptoms.         Remi Haggard, NP 02/15/12 1906

## 2012-02-16 NOTE — ED Provider Notes (Signed)
Medical screening examination/treatment/procedure(s) were conducted as a shared visit with non-physician practitioner(s) and myself.  I personally evaluated the patient during the encounter  Derwood Kaplan, MD 02/16/12 1759

## 2012-02-27 DIAGNOSIS — M545 Low back pain: Secondary | ICD-10-CM | POA: Diagnosis not present

## 2012-02-27 DIAGNOSIS — Z79899 Other long term (current) drug therapy: Secondary | ICD-10-CM | POA: Diagnosis not present

## 2012-02-27 DIAGNOSIS — R609 Edema, unspecified: Secondary | ICD-10-CM | POA: Diagnosis not present

## 2012-02-27 DIAGNOSIS — M069 Rheumatoid arthritis, unspecified: Secondary | ICD-10-CM | POA: Diagnosis not present

## 2012-02-27 DIAGNOSIS — M2550 Pain in unspecified joint: Secondary | ICD-10-CM | POA: Diagnosis not present

## 2012-04-07 DIAGNOSIS — E039 Hypothyroidism, unspecified: Secondary | ICD-10-CM | POA: Diagnosis not present

## 2012-04-13 ENCOUNTER — Encounter (HOSPITAL_COMMUNITY): Payer: Self-pay | Admitting: *Deleted

## 2012-04-13 ENCOUNTER — Emergency Department (HOSPITAL_COMMUNITY)
Admission: EM | Admit: 2012-04-13 | Discharge: 2012-04-13 | Disposition: A | Discharge status: Home / Self Care | Payer: Medicare Other | Attending: Emergency Medicine | Admitting: Emergency Medicine

## 2012-04-13 DIAGNOSIS — I10 Essential (primary) hypertension: Secondary | ICD-10-CM | POA: Insufficient documentation

## 2012-04-13 DIAGNOSIS — R42 Dizziness and giddiness: Secondary | ICD-10-CM | POA: Insufficient documentation

## 2012-04-13 DIAGNOSIS — K219 Gastro-esophageal reflux disease without esophagitis: Secondary | ICD-10-CM | POA: Insufficient documentation

## 2012-04-13 DIAGNOSIS — Z79899 Other long term (current) drug therapy: Secondary | ICD-10-CM | POA: Insufficient documentation

## 2012-04-13 DIAGNOSIS — Z760 Encounter for issue of repeat prescription: Secondary | ICD-10-CM | POA: Insufficient documentation

## 2012-04-13 DIAGNOSIS — E039 Hypothyroidism, unspecified: Secondary | ICD-10-CM | POA: Insufficient documentation

## 2012-04-13 DIAGNOSIS — E785 Hyperlipidemia, unspecified: Secondary | ICD-10-CM | POA: Insufficient documentation

## 2012-04-13 DIAGNOSIS — R4589 Other symptoms and signs involving emotional state: Secondary | ICD-10-CM | POA: Insufficient documentation

## 2012-04-13 DIAGNOSIS — Z7982 Long term (current) use of aspirin: Secondary | ICD-10-CM | POA: Insufficient documentation

## 2012-04-13 DIAGNOSIS — Z87891 Personal history of nicotine dependence: Secondary | ICD-10-CM | POA: Insufficient documentation

## 2012-04-13 DIAGNOSIS — F19939 Other psychoactive substance use, unspecified with withdrawal, unspecified: Secondary | ICD-10-CM | POA: Insufficient documentation

## 2012-04-13 DIAGNOSIS — Z8739 Personal history of other diseases of the musculoskeletal system and connective tissue: Secondary | ICD-10-CM | POA: Insufficient documentation

## 2012-04-13 DIAGNOSIS — F411 Generalized anxiety disorder: Secondary | ICD-10-CM | POA: Insufficient documentation

## 2012-04-13 DIAGNOSIS — G479 Sleep disorder, unspecified: Secondary | ICD-10-CM | POA: Insufficient documentation

## 2012-04-13 LAB — CBC WITH DIFFERENTIAL/PLATELET
Basophils Relative: 0 % (ref 0–1)
Eosinophils Absolute: 0.2 10*3/uL (ref 0.0–0.7)
HCT: 37.7 % (ref 36.0–46.0)
Hemoglobin: 11.9 g/dL — ABNORMAL LOW (ref 12.0–15.0)
MCH: 28.4 pg (ref 26.0–34.0)
MCHC: 31.6 g/dL (ref 30.0–36.0)
Monocytes Absolute: 1 10*3/uL (ref 0.1–1.0)
Monocytes Relative: 12 % (ref 3–12)
RDW: 17.3 % — ABNORMAL HIGH (ref 11.5–15.5)

## 2012-04-13 LAB — BASIC METABOLIC PANEL
BUN: 15 mg/dL (ref 6–23)
Calcium: 9.3 mg/dL (ref 8.4–10.5)
Creatinine, Ser: 0.7 mg/dL (ref 0.50–1.10)
GFR calc Af Amer: >90 mL/min (ref >90)
GFR calc non Af Amer: 79 mL/min — ABNORMAL LOW (ref >90)

## 2012-04-13 MED ORDER — LORAZEPAM 0.5 MG PO TABS: 0.5000 mg | ORAL_TABLET | Freq: Two times a day (BID) | ORAL | Status: DC | PRN

## 2012-04-13 MED ORDER — LORAZEPAM 0.5 MG PO TABS
0.5000 mg | ORAL_TABLET | Freq: Once | ORAL | Status: AC
  Administered 2012-04-13: 0.5 mg via ORAL
  Filled 2012-04-13: qty 1

## 2012-04-13 MED ORDER — OXYCODONE-ACETAMINOPHEN 5-325 MG PO TABS: 1.0000 | ORAL_TABLET | Freq: Four times a day (QID) | ORAL | Status: DC | PRN

## 2012-04-13 MED ORDER — HYDROCODONE-ACETAMINOPHEN 5-325 MG PO TABS
1.0000 | ORAL_TABLET | Freq: Once | ORAL | Status: AC
  Administered 2012-04-13: 1 via ORAL
  Filled 2012-04-13: qty 1

## 2012-04-13 NOTE — ED Notes (Signed)
Equal facial grimacing; ambulating per norm with a cane; pt states feels like she is losing her balance; dizzy

## 2012-04-13 NOTE — ED Notes (Signed)
Pt states she feels shaky and off balance and like she is "just burning up, like my arms are stinging." Pt A&O x 4. Grips are equal and strong.

## 2012-04-13 NOTE — ED Notes (Signed)
Pt states is weaning off of meds; stopped lorazepam and percocet last Wednesday; feeling jerky all night; feels like face swollen; states bp elevated at home; states head feels crazy

## 2012-04-13 NOTE — ED Provider Notes (Signed)
History     CSN: [id number]  Arrival date & time 04/13/12  2130   First MD Initiated Contact with Patient 04/13/12 [phone]      Chief Complaint  Patient presents with  . Withdrawal  . Dizziness    (Consider location/radiation/quality/duration/timing/severity/associated sxs/prior treatment) The history is provided by the patient and a relative.  pt states hx anxiety and arthritis for which she had been on percocet and ativan for a long time.  States was taking her ativan bid prn instead of the prescribed once daily prn so had run out a couple days ago. States also out of her percocet. pcp is [facility], and states office told her it wasn't time to refill so has been out/abruptly stopped for past couple days. Since then, state feels out of sort, trouble sleeping/restless at night. Feels anxious, sl shaky. States bp was high today. States otherwise health at baseline. Denies recent change in other meds. No new pain, states just her usual all over joint pain. No headaches. No cp or sob. No abd pain. No nvd. No gu c/o. No fever or chills.     Past Medical History  Diagnosis Date  . Hypertension   . Shortness of breath     occas  . Arthritis     RA  . Hypothyroidism   . Hyperlipidemia   . GERD (gastroesophageal reflux disease)   . Anxiety     HX OF PANIC ATTACKS    Past Surgical History  Procedure Laterality Date  . Joint replacement  1998    left hip replacement and bilateral elbow replacements  about 15 yrs ago  . Thyroidectomy    . Total hip revision  03/11/2011    Procedure: TOTAL HIP REVISION;  Surgeon: [name], MD;  Location: [facility];  Service: Orthopedics;  Laterality: Left;  . Total hip arthroplasty      No family history on file.  History  Substance Use Topics  . Smoking status: Former Games developer  . Smokeless tobacco: Never Used     Comment: QUIT SMOKING IN THE 70"S  . Alcohol Use: No    OB History   Grav Para Term Preterm Abortions TAB SAB Ect Mult Living                   Review of Systems  Constitutional: Negative for fever and chills.  HENT: Negative for neck pain.   Eyes: Negative for visual disturbance.  Respiratory: Negative for cough and shortness of breath.   Cardiovascular: Negative for chest pain.  Gastrointestinal: Negative for vomiting and abdominal pain.  Genitourinary: Negative for flank pain.  Musculoskeletal: Negative for back pain.  Skin: Negative for rash.  Neurological: Negative for headaches.  Hematological: Does not bruise/bleed easily.  Psychiatric/Behavioral: Negative for confusion.    Allergies  Contrast media and Dilaudid  Home Medications   Current Outpatient Rx  Name  Route  Sig  Dispense  Refill  . amLODipine (NORVASC) 5 MG tablet   Oral   Take 5 mg by mouth every morning.         Marland Kitchen aspirin EC 81 MG tablet   Oral   Take 81 mg by mouth daily.         . benazepril (LOTENSIN) 20 MG tablet   Oral   Take 20 mg by mouth every morning.         . fexofenadine (ALLEGRA) 180 MG tablet   Oral   Take 180 mg by mouth daily.         Marland Kitchen  folic acid (FOLVITE) 1 MG tablet   Oral   Take 1 mg by mouth daily.         . furosemide (LASIX) 20 MG tablet   Oral   Take 20 mg by mouth every other day.         . levothyroxine (SYNTHROID, LEVOTHROID) 150 MCG tablet   Oral   Take 150 mcg by mouth daily. Not taking on sundays         . LORazepam (ATIVAN) 0.5 MG tablet   Oral   Take 0.25 mg by mouth at bedtime.          . methotrexate (RHEUMATREX) 2.5 MG tablet   Oral   Take 15 mg by mouth once a week. Caution:Chemotherapy. Protect from light.  mondays         . Omega-3 Fatty Acids (FISH OIL) 1000 MG CAPS   Oral   Take 1 capsule by mouth daily.         Marland Kitchen omeprazole (PRILOSEC) 20 MG capsule   Oral   Take 20 mg by mouth daily.         Marland Kitchen oxyCODONE-acetaminophen (PERCOCET/ROXICET) 5-325 MG per tablet   Oral   Take 1 tablet by mouth every 4 (four) hours as needed for pain. For pain          . predniSONE (DELTASONE) 5 MG tablet   Oral   Take 1 tablet (5 mg total) by mouth daily.   15 tablet   0   . rosuvastatin (CRESTOR) 20 MG tablet   Oral   Take 10 mg by mouth daily.         . Vitamin D, Ergocalciferol, (DRISDOL) 50000 UNITS CAPS   Oral   Take 50,000 Units by mouth. Monday           BP 125/57  Pulse 81  Temp(Src) 98 F (36.7 C) (Oral)  Resp 20  Ht 5\' 2"  (1.575 m)  Wt 185 lb (83.915 kg)  BMI 33.83 kg/m2  SpO2 98%  Physical Exam  Nursing note and vitals reviewed. Constitutional: She is oriented to person, place, and time. She appears well-developed and well-nourished. No distress.  HENT:  Mouth/Throat: Oropharynx is clear and moist.  Eyes: Conjunctivae are normal. No scleral icterus.  Neck: Normal range of motion. Neck supple. No tracheal deviation present. No thyromegaly present.  Cardiovascular: Normal rate, regular rhythm, normal heart sounds and intact distal pulses.   Pulmonary/Chest: Effort normal and breath sounds normal. No respiratory distress.  Abdominal: Soft. Normal appearance and bowel sounds are normal. She exhibits no distension. There is no tenderness.  Genitourinary:  No cva tenderness  Musculoskeletal: She exhibits no edema and no tenderness.  Neurological: She is alert and oriented to person, place, and time.  Motor intact bil.   Skin: Skin is warm and dry. No rash noted.  Psychiatric: She has a normal mood and affect.    ED Course  Procedures (including critical care time)  Results for orders placed during the hospital encounter of 04/13/12  CBC WITH DIFFERENTIAL      Result Value Range   WBC 7.8  4.0 - 10.5 K/uL   RBC 4.19  3.87 - 5.11 MIL/uL   Hemoglobin 11.9 (*) 12.0 - 15.0 g/dL   HCT 47.8  29.5 - 62.1 %   MCV 90.0  78.0 - 100.0 fL   MCH 28.4  26.0 - 34.0 pg   MCHC 31.6  30.0 - 36.0 g/dL   RDW 30.8 (*)  11.5 - 15.5 %   Platelets 397  150 - 400 K/uL   Neutrophils Relative 62  43 - 77 %   Neutro Abs 4.8  1.7 - 7.7  K/uL   Lymphocytes Relative 23  12 - 46 %   Lymphs Abs 1.8  0.7 - 4.0 K/uL   Monocytes Relative 12  3 - 12 %   Monocytes Absolute 1.0  0.1 - 1.0 K/uL   Eosinophils Relative 2  0 - 5 %   Eosinophils Absolute 0.2  0.0 - 0.7 K/uL   Basophils Relative 0  0 - 1 %   Basophils Absolute 0.0  0.0 - 0.1 K/uL  BASIC METABOLIC PANEL      Result Value Range   Sodium 142  135 - 145 mEq/L   Potassium 4.3  3.5 - 5.1 mEq/L   Chloride 102  96 - 112 mEq/L   CO2 31  19 - 32 mEq/L   Glucose, Bld 116 (*) 70 - 99 mg/dL   BUN 15  6 - 23 mg/dL   Creatinine, Ser 4.09  0.50 - 1.10 mg/dL   Calcium 9.3  8.4 - 81.1 mg/dL   GFR calc non Af Amer 79 (*) >90 mL/min   GFR calc Af Amer >90  >90 mL/min      MDM  Labs.  Hydrocodone 1 po, ativan .5 mg po.  Reviewed nursing notes and prior charts for additional history.   pts daughter/family w pt, confirm pts account. Requests rx until can follow up with pcp to discuss plan to either continue w rx or to gradually taper off.  Recheck pt calm, alert, content. No c/o. States will f/u w her md this week.         Suzi Roots, MD 04/13/12 [phone]

## 2012-04-27 DIAGNOSIS — I10 Essential (primary) hypertension: Secondary | ICD-10-CM | POA: Diagnosis not present

## 2012-05-05 DIAGNOSIS — N83209 Unspecified ovarian cyst, unspecified side: Secondary | ICD-10-CM | POA: Diagnosis not present

## 2012-05-07 DIAGNOSIS — R141 Gas pain: Secondary | ICD-10-CM | POA: Diagnosis not present

## 2012-05-07 DIAGNOSIS — R143 Flatulence: Secondary | ICD-10-CM | POA: Diagnosis not present

## 2012-05-07 DIAGNOSIS — N83209 Unspecified ovarian cyst, unspecified side: Secondary | ICD-10-CM | POA: Diagnosis not present

## 2012-05-14 DIAGNOSIS — M2550 Pain in unspecified joint: Secondary | ICD-10-CM | POA: Diagnosis not present

## 2012-05-14 DIAGNOSIS — M069 Rheumatoid arthritis, unspecified: Secondary | ICD-10-CM | POA: Diagnosis not present

## 2012-05-14 DIAGNOSIS — Z79899 Other long term (current) drug therapy: Secondary | ICD-10-CM | POA: Diagnosis not present

## 2012-05-14 DIAGNOSIS — M545 Low back pain: Secondary | ICD-10-CM | POA: Diagnosis not present

## 2012-05-27 DIAGNOSIS — I10 Essential (primary) hypertension: Secondary | ICD-10-CM | POA: Diagnosis not present

## 2012-05-27 DIAGNOSIS — M949 Disorder of cartilage, unspecified: Secondary | ICD-10-CM | POA: Diagnosis not present

## 2012-05-27 DIAGNOSIS — R82998 Other abnormal findings in urine: Secondary | ICD-10-CM | POA: Diagnosis not present

## 2012-05-27 DIAGNOSIS — E785 Hyperlipidemia, unspecified: Secondary | ICD-10-CM | POA: Diagnosis not present

## 2012-05-27 DIAGNOSIS — E559 Vitamin D deficiency, unspecified: Secondary | ICD-10-CM | POA: Diagnosis not present

## 2012-05-27 DIAGNOSIS — E039 Hypothyroidism, unspecified: Secondary | ICD-10-CM | POA: Diagnosis not present

## 2012-05-28 DIAGNOSIS — R82998 Other abnormal findings in urine: Secondary | ICD-10-CM | POA: Diagnosis not present

## 2012-05-28 DIAGNOSIS — Z79899 Other long term (current) drug therapy: Secondary | ICD-10-CM | POA: Diagnosis not present

## 2012-05-28 DIAGNOSIS — M2550 Pain in unspecified joint: Secondary | ICD-10-CM | POA: Diagnosis not present

## 2012-05-28 DIAGNOSIS — M069 Rheumatoid arthritis, unspecified: Secondary | ICD-10-CM | POA: Diagnosis not present

## 2012-05-28 DIAGNOSIS — G894 Chronic pain syndrome: Secondary | ICD-10-CM | POA: Diagnosis not present

## 2012-06-02 ENCOUNTER — Other Ambulatory Visit: Payer: Self-pay | Admitting: Internal Medicine

## 2012-06-02 DIAGNOSIS — Z96649 Presence of unspecified artificial hip joint: Secondary | ICD-10-CM | POA: Diagnosis not present

## 2012-06-02 DIAGNOSIS — M47817 Spondylosis without myelopathy or radiculopathy, lumbosacral region: Secondary | ICD-10-CM | POA: Diagnosis not present

## 2012-06-02 DIAGNOSIS — M069 Rheumatoid arthritis, unspecified: Secondary | ICD-10-CM | POA: Diagnosis not present

## 2012-06-02 DIAGNOSIS — I10 Essential (primary) hypertension: Secondary | ICD-10-CM | POA: Diagnosis not present

## 2012-06-02 DIAGNOSIS — E785 Hyperlipidemia, unspecified: Secondary | ICD-10-CM | POA: Diagnosis not present

## 2012-06-02 DIAGNOSIS — M810 Age-related osteoporosis without current pathological fracture: Secondary | ICD-10-CM | POA: Diagnosis not present

## 2012-06-02 DIAGNOSIS — D649 Anemia, unspecified: Secondary | ICD-10-CM | POA: Diagnosis not present

## 2012-06-02 DIAGNOSIS — E039 Hypothyroidism, unspecified: Secondary | ICD-10-CM | POA: Diagnosis not present

## 2012-06-02 DIAGNOSIS — Z0000 Encounter for general adult medical examination without abnormal findings: Secondary | ICD-10-CM | POA: Diagnosis not present

## 2012-06-02 DIAGNOSIS — M545 Low back pain: Secondary | ICD-10-CM | POA: Diagnosis not present

## 2012-06-02 DIAGNOSIS — F410 Panic disorder [episodic paroxysmal anxiety] without agoraphobia: Secondary | ICD-10-CM | POA: Diagnosis not present

## 2012-06-02 DIAGNOSIS — M25559 Pain in unspecified hip: Secondary | ICD-10-CM | POA: Diagnosis not present

## 2012-06-02 DIAGNOSIS — Z1231 Encounter for screening mammogram for malignant neoplasm of breast: Secondary | ICD-10-CM

## 2012-06-03 DIAGNOSIS — Z1212 Encounter for screening for malignant neoplasm of rectum: Secondary | ICD-10-CM | POA: Diagnosis not present

## 2012-06-05 ENCOUNTER — Encounter (HOSPITAL_COMMUNITY): Payer: Self-pay | Admitting: Emergency Medicine

## 2012-06-05 ENCOUNTER — Emergency Department (HOSPITAL_COMMUNITY)
Admission: EM | Admit: 2012-06-05 | Discharge: 2012-06-05 | Disposition: A | Discharge status: Home / Self Care | Payer: Medicare Other | Attending: Emergency Medicine | Admitting: Emergency Medicine

## 2012-06-05 ENCOUNTER — Emergency Department (HOSPITAL_COMMUNITY): Payer: Medicare Other

## 2012-06-05 DIAGNOSIS — M25559 Pain in unspecified hip: Secondary | ICD-10-CM | POA: Insufficient documentation

## 2012-06-05 DIAGNOSIS — Z79899 Other long term (current) drug therapy: Secondary | ICD-10-CM | POA: Insufficient documentation

## 2012-06-05 DIAGNOSIS — K219 Gastro-esophageal reflux disease without esophagitis: Secondary | ICD-10-CM | POA: Diagnosis not present

## 2012-06-05 DIAGNOSIS — IMO0002 Reserved for concepts with insufficient information to code with codable children: Secondary | ICD-10-CM | POA: Diagnosis not present

## 2012-06-05 DIAGNOSIS — E039 Hypothyroidism, unspecified: Secondary | ICD-10-CM | POA: Diagnosis not present

## 2012-06-05 DIAGNOSIS — G8929 Other chronic pain: Secondary | ICD-10-CM | POA: Insufficient documentation

## 2012-06-05 DIAGNOSIS — M25552 Pain in left hip: Secondary | ICD-10-CM

## 2012-06-05 DIAGNOSIS — Z8709 Personal history of other diseases of the respiratory system: Secondary | ICD-10-CM | POA: Diagnosis not present

## 2012-06-05 DIAGNOSIS — I10 Essential (primary) hypertension: Secondary | ICD-10-CM | POA: Insufficient documentation

## 2012-06-05 DIAGNOSIS — Z471 Aftercare following joint replacement surgery: Secondary | ICD-10-CM | POA: Diagnosis not present

## 2012-06-05 DIAGNOSIS — Z96649 Presence of unspecified artificial hip joint: Secondary | ICD-10-CM | POA: Insufficient documentation

## 2012-06-05 DIAGNOSIS — Z87891 Personal history of nicotine dependence: Secondary | ICD-10-CM | POA: Insufficient documentation

## 2012-06-05 DIAGNOSIS — F411 Generalized anxiety disorder: Secondary | ICD-10-CM | POA: Insufficient documentation

## 2012-06-05 DIAGNOSIS — E785 Hyperlipidemia, unspecified: Secondary | ICD-10-CM | POA: Diagnosis not present

## 2012-06-05 DIAGNOSIS — Z7982 Long term (current) use of aspirin: Secondary | ICD-10-CM | POA: Insufficient documentation

## 2012-06-05 DIAGNOSIS — M069 Rheumatoid arthritis, unspecified: Secondary | ICD-10-CM | POA: Insufficient documentation

## 2012-06-05 DIAGNOSIS — M549 Dorsalgia, unspecified: Secondary | ICD-10-CM | POA: Diagnosis not present

## 2012-06-05 MED ORDER — HYDROMORPHONE HCL PF 1 MG/ML IJ SOLN
0.5000 mg | Freq: Once | INTRAMUSCULAR | Status: AC
  Administered 2012-06-05: 0.5 mg via INTRAVENOUS
  Filled 2012-06-05: qty 1

## 2012-06-05 MED ORDER — NAPROXEN 375 MG PO TABS
375.0000 mg | ORAL_TABLET | Freq: Once | ORAL | Status: DC
  Filled 2012-06-05: qty 1

## 2012-06-05 MED ORDER — NAPROXEN 500 MG PO TABS: 500.0000 mg | ORAL_TABLET | Freq: Two times a day (BID) | ORAL | Status: DC

## 2012-06-05 NOTE — ED Notes (Signed)
Pt left with family and one prescription.

## 2012-06-05 NOTE — ED Provider Notes (Signed)
History     CSN: [id number]  Arrival date & time 06/05/12  0941   First MD Initiated Contact with Patient 06/05/12 1029      Chief Complaint  Patient presents with  . Hip Pain    (Consider location/radiation/quality/duration/timing/severity/associated sxs/prior treatment) HPI Comments: Patient is an 79 year old female with chronic pain who presents with worsening left hip pain for the past 3 days. Patient reports having xrays done 3 days ago and when the xray tech rolled her on to her left hip, she experienced sudden onset of severe left hip pain. The pain radiates into her back and down her leg. The pain is aching. Movement makes the pain worse. Nothing makes the pain better. Her usual Percocet that she takes QID is not helping. No injury or associated symptoms. No bladder/bowel incontinence or saddle paresthesias.    Past Medical History  Diagnosis Date  . Hypertension   . Shortness of breath     occas  . Arthritis     RA  . Hypothyroidism   . Hyperlipidemia   . GERD (gastroesophageal reflux disease)   . Anxiety     HX OF PANIC ATTACKS    Past Surgical History  Procedure Laterality Date  . Joint replacement  1998    left hip replacement and bilateral elbow replacements  about 15 yrs ago  . Thyroidectomy    . Total hip revision  03/11/2011    Procedure: TOTAL HIP REVISION;  Surgeon: [name], MD;  Location: [facility];  Service: Orthopedics;  Laterality: Left;  . Total hip arthroplasty      No family history on file.  History  Substance Use Topics  . Smoking status: Former Games developer  . Smokeless tobacco: Never Used     Comment: QUIT SMOKING IN THE 70"S  . Alcohol Use: No    OB History   Grav Para Term Preterm Abortions TAB SAB Ect Mult Living                  Review of Systems  Musculoskeletal: Positive for back pain and arthralgias.  All other systems reviewed and are negative.    Allergies  Contrast media and Dilaudid  Home Medications   Current  Outpatient Rx  Name  Route  Sig  Dispense  Refill  . amLODipine (NORVASC) 5 MG tablet   Oral   Take 5 mg by mouth every morning.         Marland Kitchen aspirin 325 MG tablet   Oral   Take 325 mg by mouth daily.         . benazepril (LOTENSIN) 20 MG tablet   Oral   Take 20 mg by mouth every morning.         . cholecalciferol (VITAMIN D) 1000 UNITS tablet   Oral   Take 2,000 Units by mouth daily.         . fexofenadine (ALLEGRA) 180 MG tablet   Oral   Take 180 mg by mouth daily.         . fluticasone (FLONASE) 50 MCG/ACT nasal spray   Nasal   Place 2 sprays into the nose daily.         . folic acid (FOLVITE) 1 MG tablet   Oral   Take 1 mg by mouth daily.         Marland Kitchen gabapentin (NEURONTIN) 600 MG tablet   Oral   Take 600 mg by mouth daily. Gralise - samples from MD  office         . levothyroxine (SYNTHROID, LEVOTHROID) 150 MCG tablet   Oral   Take 75-150 mcg by mouth daily. Takes every day except on Sundays         . LORazepam (ATIVAN) 0.5 MG tablet   Oral   Take 0.25 mg by mouth at bedtime.          . Omega-3 Fatty Acids (FISH OIL) 1000 MG CAPS   Oral   Take 1 capsule by mouth daily.         Marland Kitchen omeprazole (PRILOSEC) 20 MG capsule   Oral   Take 20 mg by mouth daily.         Marland Kitchen OVER THE COUNTER MEDICATION   Oral   Take 1 tablet by mouth daily. Iron         . oxyCODONE-acetaminophen (PERCOCET) 7.5-325 MG per tablet   Oral   Take 1 tablet by mouth 4 (four) times daily.         . furosemide (LASIX) 20 MG tablet   Oral   Take 20 mg by mouth every 3 (three) days.          . methotrexate (RHEUMATREX) 2.5 MG tablet   Oral   Take 22.5 mg by mouth once a week. Caution:Chemotherapy. Protect from light.  mondays         . predniSONE (DELTASONE) 5 MG tablet   Oral   Take 1 tablet (5 mg total) by mouth daily.   15 tablet   0   . rosuvastatin (CRESTOR) 20 MG tablet   Oral   Take 10 mg by mouth every 3 (three) days.             BP 128/58  Pulse 96  Temp(Src) 98.2 F (36.8 C)  Resp 16  SpO2 94%  Physical Exam  Nursing note and vitals reviewed. Constitutional: She is oriented to person, place, and time. She appears well-developed and well-nourished. No distress.  HENT:  Head: Normocephalic and atraumatic.  Eyes: Conjunctivae are normal.  Neck: Normal range of motion.  Cardiovascular: Normal rate and regular rhythm.  Exam reveals no gallop and no friction rub.   No murmur heard. Pulmonary/Chest: Effort normal and breath sounds normal. She has no wheezes. She has no rales. She exhibits no tenderness.  Abdominal: Soft. There is no tenderness.  Musculoskeletal: Normal range of motion.  Left lumbar paraspinal tenderness to palpation. No midline spine tenderness to palpation. No lateral left hip tenderness to palpation. ROM limited due to pain.   Neurological: She is alert and oriented to person, place, and time. Coordination normal.  Extremity strength and sensation equal and intact bilaterally. Speech is goal-oriented. Moves limbs without ataxia.   Skin: Skin is warm and dry.  Psychiatric: She has a normal mood and affect. Her behavior is normal.    ED Course  Procedures (including critical care time)  Labs Reviewed - No data to display Dg Hip Complete Left  06/05/2012   *RADIOLOGY REPORT*  Clinical Data: Anterior hip pain  LEFT HIP - COMPLETE 2+ VIEW  Comparison: 03/11/2011  Findings: Stable left hip arthroplasty changes.  Bones appear osteopenic.  Healed fracture of the left inferior ramus.  No malalignment or acute osseous finding.  No definite soft tissue abnormality.  IMPRESSION: Previous left hip arthroplasty.  No acute finding by plain radiography  Osteopenia   Original Report Authenticated By: Judie Petit. Shick, M.D.     1. Left hip pain  MDM  10:55 AM Xray unremarkable for acute changes. No neurovascular compromise.   11:14 AM Patient will have 0.5mg  dilaudid and naproxen here. Vitals  stable and patient afebrile. Patient will have Naproxen to go home with. Patient has a follow up with her surgeon on 2 days. Patient instructed to return with worsening or concerning symptoms.       Emilia Beck, PA-C 06/05/12 1128

## 2012-06-05 NOTE — ED Notes (Signed)
Pt reports that she felt hot with dilaudid but did not have an allergic rxn and is able to take.

## 2012-06-05 NOTE — ED Notes (Signed)
Per patient, history of left hip surgery-increased pain since Tuesday-unable to see surgeon/pain management MD-pain radiating to back

## 2012-06-06 NOTE — ED Provider Notes (Signed)
Pt with ongoing hip pain - not controlled with meds at home - already on high dose opiates.  She has ttp on exam but is ambulatory with her walker on my exam, otherwise she appears unremarkable - she has no deformity, nv intact - single injection of medication here - will not increase home meds - to f/u with PMD.  Medical screening examination/treatment/procedure(s) were conducted as a shared visit with non-physician practitioner(s) and myself.  I personally evaluated the patient during the encounter    Vida Roller, MD 06/06/12 [phone]

## 2012-06-08 DIAGNOSIS — Z96649 Presence of unspecified artificial hip joint: Secondary | ICD-10-CM | POA: Diagnosis not present

## 2012-06-16 DIAGNOSIS — Z79899 Other long term (current) drug therapy: Secondary | ICD-10-CM | POA: Diagnosis not present

## 2012-06-16 DIAGNOSIS — M069 Rheumatoid arthritis, unspecified: Secondary | ICD-10-CM | POA: Diagnosis not present

## 2012-06-24 DIAGNOSIS — Z96649 Presence of unspecified artificial hip joint: Secondary | ICD-10-CM | POA: Diagnosis not present

## 2012-06-24 DIAGNOSIS — M545 Low back pain: Secondary | ICD-10-CM | POA: Diagnosis not present

## 2012-06-25 DIAGNOSIS — M461 Sacroiliitis, not elsewhere classified: Secondary | ICD-10-CM | POA: Diagnosis not present

## 2012-06-25 DIAGNOSIS — G894 Chronic pain syndrome: Secondary | ICD-10-CM | POA: Diagnosis not present

## 2012-06-25 DIAGNOSIS — Z79899 Other long term (current) drug therapy: Secondary | ICD-10-CM | POA: Diagnosis not present

## 2012-06-25 DIAGNOSIS — M5137 Other intervertebral disc degeneration, lumbosacral region: Secondary | ICD-10-CM | POA: Diagnosis not present

## 2012-06-28 ENCOUNTER — Emergency Department (HOSPITAL_COMMUNITY): Payer: Medicare Other

## 2012-06-28 ENCOUNTER — Encounter (HOSPITAL_COMMUNITY): Payer: Self-pay | Admitting: Family Medicine

## 2012-06-28 ENCOUNTER — Emergency Department (HOSPITAL_COMMUNITY)
Admission: EM | Admit: 2012-06-28 | Discharge: 2012-06-28 | Disposition: A | Discharge status: Home / Self Care | Payer: Medicare Other | Attending: Emergency Medicine | Admitting: Emergency Medicine

## 2012-06-28 DIAGNOSIS — M069 Rheumatoid arthritis, unspecified: Secondary | ICD-10-CM | POA: Insufficient documentation

## 2012-06-28 DIAGNOSIS — IMO0002 Reserved for concepts with insufficient information to code with codable children: Secondary | ICD-10-CM | POA: Diagnosis not present

## 2012-06-28 DIAGNOSIS — E785 Hyperlipidemia, unspecified: Secondary | ICD-10-CM | POA: Diagnosis not present

## 2012-06-28 DIAGNOSIS — M129 Arthropathy, unspecified: Secondary | ICD-10-CM | POA: Diagnosis not present

## 2012-06-28 DIAGNOSIS — Z96649 Presence of unspecified artificial hip joint: Secondary | ICD-10-CM | POA: Insufficient documentation

## 2012-06-28 DIAGNOSIS — Z8709 Personal history of other diseases of the respiratory system: Secondary | ICD-10-CM | POA: Insufficient documentation

## 2012-06-28 DIAGNOSIS — R002 Palpitations: Secondary | ICD-10-CM | POA: Diagnosis not present

## 2012-06-28 DIAGNOSIS — E039 Hypothyroidism, unspecified: Secondary | ICD-10-CM | POA: Insufficient documentation

## 2012-06-28 DIAGNOSIS — F411 Generalized anxiety disorder: Secondary | ICD-10-CM | POA: Insufficient documentation

## 2012-06-28 DIAGNOSIS — K219 Gastro-esophageal reflux disease without esophagitis: Secondary | ICD-10-CM | POA: Insufficient documentation

## 2012-06-28 DIAGNOSIS — Z87891 Personal history of nicotine dependence: Secondary | ICD-10-CM | POA: Diagnosis not present

## 2012-06-28 DIAGNOSIS — I10 Essential (primary) hypertension: Secondary | ICD-10-CM | POA: Diagnosis not present

## 2012-06-28 DIAGNOSIS — Z79899 Other long term (current) drug therapy: Secondary | ICD-10-CM | POA: Insufficient documentation

## 2012-06-28 DIAGNOSIS — Z7982 Long term (current) use of aspirin: Secondary | ICD-10-CM | POA: Insufficient documentation

## 2012-06-28 DIAGNOSIS — R6889 Other general symptoms and signs: Secondary | ICD-10-CM | POA: Diagnosis not present

## 2012-06-28 LAB — POCT I-STAT, CHEM 8
Calcium, Ion: 1.09 mmol/L — ABNORMAL LOW (ref 1.13–1.30)
Glucose, Bld: 105 mg/dL — ABNORMAL HIGH (ref 70–99)
HCT: 38 % (ref 36.0–46.0)
Hemoglobin: 12.9 g/dL (ref 12.0–15.0)
Potassium: 3.5 mEq/L (ref 3.5–5.1)

## 2012-06-28 LAB — CBC WITH DIFFERENTIAL/PLATELET
Basophils Relative: 0 % (ref 0–1)
Eosinophils Absolute: 0.1 10*3/uL (ref 0.0–0.7)
Hemoglobin: 11.9 g/dL — ABNORMAL LOW (ref 12.0–15.0)
MCH: 28.8 pg (ref 26.0–34.0)
MCHC: 31.9 g/dL (ref 30.0–36.0)
Monocytes Relative: 7 % (ref 3–12)
Neutrophils Relative %: 74 % (ref 43–77)
Platelets: 402 10*3/uL — ABNORMAL HIGH (ref 150–400)

## 2012-06-28 LAB — URINALYSIS, ROUTINE W REFLEX MICROSCOPIC
Bilirubin Urine: NEGATIVE
Ketones, ur: NEGATIVE mg/dL
Nitrite: NEGATIVE
Protein, ur: NEGATIVE mg/dL
Urobilinogen, UA: 0.2 mg/dL (ref 0.0–1.0)

## 2012-06-28 NOTE — ED Notes (Signed)
Per EMS, pt woke up this am feeling hypertensive. BP 170/90. Pt hx of anxiety.

## 2012-06-28 NOTE — ED Provider Notes (Signed)
History     CSN: [id number]  Arrival date & time 06/28/12  4098   First MD Initiated Contact with Patient 06/28/12 [phone]      Chief Complaint  Patient presents with  . Hypertension    (Consider location/radiation/quality/duration/timing/severity/associated sxs/prior treatment) Patient is a 79 y.o. female presenting with hypertension. The history is provided by the patient.  Hypertension Pertinent negatives include no chest pain, no abdominal pain, no headaches and no shortness of breath.   patient states that she's felt as if her blood pressure was high this morning. She states she felt that her heart was racing when she woke up at 4 the morning. She been feeling fine yesterday. No chest pain. No difficulty breathing. No swelling or legs. No bowel pain. No fevers. She feels better now. No abdominal pain.  Past Medical History  Diagnosis Date  . Hypertension   . Shortness of breath     occas  . Arthritis     RA  . Hypothyroidism   . Hyperlipidemia   . GERD (gastroesophageal reflux disease)   . Anxiety     HX OF PANIC ATTACKS    Past Surgical History  Procedure Laterality Date  . Joint replacement  1998    left hip replacement and bilateral elbow replacements  about 15 yrs ago  . Thyroidectomy    . Total hip revision  03/11/2011    Procedure: TOTAL HIP REVISION;  Surgeon: [name], MD;  Location: [facility];  Service: Orthopedics;  Laterality: Left;  . Total hip arthroplasty      History reviewed. No pertinent family history.  History  Substance Use Topics  . Smoking status: Former Games developer  . Smokeless tobacco: Never Used     Comment: QUIT SMOKING IN THE 70"S  . Alcohol Use: No    OB History   Grav Para Term Preterm Abortions TAB SAB Ect Mult Living                  Review of Systems  Constitutional: Negative for activity change and appetite change.  HENT: Negative for neck stiffness.   Eyes: Negative for pain.  Respiratory: Negative for chest tightness and  shortness of breath.   Cardiovascular: Positive for palpitations. Negative for chest pain and leg swelling.  Gastrointestinal: Negative for nausea, vomiting, abdominal pain and diarrhea.  Genitourinary: Negative for flank pain.  Musculoskeletal: Negative for back pain.  Skin: Negative for rash.  Neurological: Negative for weakness, numbness and headaches.  Psychiatric/Behavioral: Negative for behavioral problems.    Allergies  Contrast media and Dilaudid  Home Medications   Current Outpatient Rx  Name  Route  Sig  Dispense  Refill  . amLODipine (NORVASC) 5 MG tablet   Oral   Take 5 mg by mouth every morning.         Marland Kitchen aspirin 325 MG tablet   Oral   Take 325 mg by mouth daily.         . benazepril (LOTENSIN) 20 MG tablet   Oral   Take 20 mg by mouth every morning.         . cholecalciferol (VITAMIN D) 1000 UNITS tablet   Oral   Take 2,000 Units by mouth daily.         . fexofenadine (ALLEGRA) 180 MG tablet   Oral   Take 180 mg by mouth daily.         . fluticasone (FLONASE) 50 MCG/ACT nasal spray   Nasal  Place 2 sprays into the nose daily.         . folic acid (FOLVITE) 1 MG tablet   Oral   Take 1 mg by mouth daily.         . furosemide (LASIX) 20 MG tablet   Oral   Take 20 mg by mouth every 3 (three) days.          Marland Kitchen gabapentin (NEURONTIN) 600 MG tablet   Oral   Take 600 mg by mouth daily. Gralise - samples from MD office         . levothyroxine (SYNTHROID, LEVOTHROID) 150 MCG tablet   Oral   Take 75-150 mcg by mouth daily. Takes every day except on Sundays         . LORazepam (ATIVAN) 0.5 MG tablet   Oral   Take 0.25 mg by mouth at bedtime.          . methotrexate (RHEUMATREX) 2.5 MG tablet   Oral   Take 22.5 mg by mouth once a week. Caution:Chemotherapy. Protect from light.  mondays         . naproxen (NAPROSYN) 500 MG tablet   Oral   Take 1 tablet (500 mg total) by mouth 2 (two) times daily with a meal.    20 tablet   0   . Omega-3 Fatty Acids (FISH OIL) 1000 MG CAPS   Oral   Take 1 capsule by mouth daily.         Marland Kitchen omeprazole (PRILOSEC) 20 MG capsule   Oral   Take 20 mg by mouth daily.         Marland Kitchen OVER THE COUNTER MEDICATION   Oral   Take 1 tablet by mouth daily. Iron         . oxyCODONE-acetaminophen (PERCOCET) 7.5-325 MG per tablet   Oral   Take 1 tablet by mouth 4 (four) times daily.         . predniSONE (DELTASONE) 5 MG tablet   Oral   Take 1 tablet (5 mg total) by mouth daily.   15 tablet   0   . rosuvastatin (CRESTOR) 20 MG tablet   Oral   Take 10 mg by mouth every 3 (three) days.            BP 156/36  Pulse 76  Temp(Src) 98.5 F (36.9 C) (Oral)  Resp 18  SpO2 99%  Physical Exam  Nursing note and vitals reviewed. Constitutional: She is oriented to person, place, and time. She appears well-developed and well-nourished.  HENT:  Head: Normocephalic and atraumatic.  Eyes: EOM are normal. Pupils are equal, round, and reactive to light.  Neck: Normal range of motion. Neck supple.  Cardiovascular: Normal rate, regular rhythm and normal heart sounds.   No murmur heard. Pulmonary/Chest: Effort normal and breath sounds normal. No respiratory distress. She has no wheezes. She has no rales.  Abdominal: Soft. Bowel sounds are normal. She exhibits no distension. There is no tenderness. There is no rebound and no guarding.  Musculoskeletal: Normal range of motion.  Neurological: She is alert and oriented to person, place, and time. No cranial nerve deficit.  Skin: Skin is warm and dry.  Psychiatric: She has a normal mood and affect. Her speech is normal.    ED Course  Procedures (including critical care time)  Labs Reviewed  URINALYSIS, ROUTINE W REFLEX MICROSCOPIC  CBC WITH DIFFERENTIAL   No results found.   No diagnosis found.  Date: 06/28/2012  Rate: 74  Rhythm: normal sinus rhythm  QRS Axis: normal  Intervals: normal  ST/T Wave abnormalities:  normal  Conduction Disutrbances: none  Narrative Interpretation: Nonspecific T wave change in III, however unchanged from previous      MDM   Patient with a strange feeling nauseous at home. Felt like palpitations. EKG reassuring and lab work reassuring. Will discharge home. Patient will followup with Dr. Sherrye Payor as needed        Juliet Rude. Rubin Payor, MD 06/28/12 1128

## 2012-07-01 DIAGNOSIS — M545 Low back pain: Secondary | ICD-10-CM | POA: Diagnosis not present

## 2012-07-03 ENCOUNTER — Ambulatory Visit: Payer: Medicare Other

## 2012-07-08 DIAGNOSIS — M545 Low back pain: Secondary | ICD-10-CM | POA: Diagnosis not present

## 2012-07-23 DIAGNOSIS — M2550 Pain in unspecified joint: Secondary | ICD-10-CM | POA: Diagnosis not present

## 2012-07-23 DIAGNOSIS — G894 Chronic pain syndrome: Secondary | ICD-10-CM | POA: Diagnosis not present

## 2012-07-23 DIAGNOSIS — M79609 Pain in unspecified limb: Secondary | ICD-10-CM | POA: Diagnosis not present

## 2012-07-23 DIAGNOSIS — M069 Rheumatoid arthritis, unspecified: Secondary | ICD-10-CM | POA: Diagnosis not present

## 2012-07-23 DIAGNOSIS — M461 Sacroiliitis, not elsewhere classified: Secondary | ICD-10-CM | POA: Diagnosis not present

## 2012-07-23 DIAGNOSIS — Z79899 Other long term (current) drug therapy: Secondary | ICD-10-CM | POA: Diagnosis not present

## 2012-07-23 DIAGNOSIS — M5137 Other intervertebral disc degeneration, lumbosacral region: Secondary | ICD-10-CM | POA: Diagnosis not present

## 2012-07-23 DIAGNOSIS — M545 Low back pain: Secondary | ICD-10-CM | POA: Diagnosis not present

## 2012-07-27 DIAGNOSIS — F410 Panic disorder [episodic paroxysmal anxiety] without agoraphobia: Secondary | ICD-10-CM | POA: Diagnosis not present

## 2012-07-27 DIAGNOSIS — M545 Low back pain: Secondary | ICD-10-CM | POA: Diagnosis not present

## 2012-07-27 DIAGNOSIS — Z6834 Body mass index (BMI) 34.0-34.9, adult: Secondary | ICD-10-CM | POA: Diagnosis not present

## 2012-07-27 DIAGNOSIS — I10 Essential (primary) hypertension: Secondary | ICD-10-CM | POA: Diagnosis not present

## 2012-07-27 DIAGNOSIS — M069 Rheumatoid arthritis, unspecified: Secondary | ICD-10-CM | POA: Diagnosis not present

## 2012-07-28 ENCOUNTER — Emergency Department (HOSPITAL_COMMUNITY)
Admission: EM | Admit: 2012-07-28 | Discharge: 2012-07-28 | Disposition: A | Discharge status: Home / Self Care | Payer: Medicare Other | Attending: Emergency Medicine | Admitting: Emergency Medicine

## 2012-07-28 ENCOUNTER — Encounter (HOSPITAL_COMMUNITY): Payer: Self-pay | Admitting: *Deleted

## 2012-07-28 ENCOUNTER — Emergency Department (HOSPITAL_COMMUNITY): Payer: Medicare Other

## 2012-07-28 DIAGNOSIS — Z87891 Personal history of nicotine dependence: Secondary | ICD-10-CM | POA: Diagnosis not present

## 2012-07-28 DIAGNOSIS — IMO0002 Reserved for concepts with insufficient information to code with codable children: Secondary | ICD-10-CM | POA: Insufficient documentation

## 2012-07-28 DIAGNOSIS — R51 Headache: Secondary | ICD-10-CM | POA: Diagnosis not present

## 2012-07-28 DIAGNOSIS — K219 Gastro-esophageal reflux disease without esophagitis: Secondary | ICD-10-CM | POA: Insufficient documentation

## 2012-07-28 DIAGNOSIS — Z8739 Personal history of other diseases of the musculoskeletal system and connective tissue: Secondary | ICD-10-CM | POA: Insufficient documentation

## 2012-07-28 DIAGNOSIS — N200 Calculus of kidney: Secondary | ICD-10-CM | POA: Diagnosis not present

## 2012-07-28 DIAGNOSIS — Z79899 Other long term (current) drug therapy: Secondary | ICD-10-CM | POA: Insufficient documentation

## 2012-07-28 DIAGNOSIS — R5383 Other fatigue: Secondary | ICD-10-CM | POA: Diagnosis not present

## 2012-07-28 DIAGNOSIS — F411 Generalized anxiety disorder: Secondary | ICD-10-CM | POA: Insufficient documentation

## 2012-07-28 DIAGNOSIS — I10 Essential (primary) hypertension: Secondary | ICD-10-CM | POA: Diagnosis not present

## 2012-07-28 DIAGNOSIS — R109 Unspecified abdominal pain: Secondary | ICD-10-CM | POA: Insufficient documentation

## 2012-07-28 DIAGNOSIS — Z7982 Long term (current) use of aspirin: Secondary | ICD-10-CM | POA: Insufficient documentation

## 2012-07-28 DIAGNOSIS — F419 Anxiety disorder, unspecified: Secondary | ICD-10-CM

## 2012-07-28 DIAGNOSIS — E039 Hypothyroidism, unspecified: Secondary | ICD-10-CM | POA: Insufficient documentation

## 2012-07-28 DIAGNOSIS — R259 Unspecified abnormal involuntary movements: Secondary | ICD-10-CM | POA: Diagnosis not present

## 2012-07-28 DIAGNOSIS — E785 Hyperlipidemia, unspecified: Secondary | ICD-10-CM | POA: Diagnosis not present

## 2012-07-28 DIAGNOSIS — R5381 Other malaise: Secondary | ICD-10-CM | POA: Insufficient documentation

## 2012-07-28 DIAGNOSIS — J3489 Other specified disorders of nose and nasal sinuses: Secondary | ICD-10-CM | POA: Insufficient documentation

## 2012-07-28 DIAGNOSIS — R531 Weakness: Secondary | ICD-10-CM

## 2012-07-28 LAB — URINALYSIS, ROUTINE W REFLEX MICROSCOPIC
Bilirubin Urine: NEGATIVE
Glucose, UA: NEGATIVE mg/dL
Hgb urine dipstick: NEGATIVE
Protein, ur: NEGATIVE mg/dL

## 2012-07-28 LAB — POCT I-STAT, CHEM 8
BUN: 12 mg/dL (ref 6–23)
Calcium, Ion: 1.11 mmol/L — ABNORMAL LOW (ref 1.13–1.30)
Chloride: 103 mEq/L (ref 96–112)
Creatinine, Ser: 0.7 mg/dL (ref 0.50–1.10)
Sodium: 140 mEq/L (ref 135–145)

## 2012-07-28 LAB — CBC WITH DIFFERENTIAL/PLATELET
Basophils Absolute: 0 10*3/uL (ref 0.0–0.1)
Basophils Relative: 0 % (ref 0–1)
Lymphocytes Relative: 17 % (ref 12–46)
MCHC: 32.4 g/dL (ref 30.0–36.0)
Monocytes Absolute: 0.6 10*3/uL (ref 0.1–1.0)
Neutro Abs: 4.9 10*3/uL (ref 1.7–7.7)
Neutrophils Relative %: 73 % (ref 43–77)
Platelets: 381 10*3/uL (ref 150–400)
RDW: 18.5 % — ABNORMAL HIGH (ref 11.5–15.5)
WBC: 6.7 10*3/uL (ref 4.0–10.5)

## 2012-07-28 LAB — CG4 I-STAT (LACTIC ACID): Lactic Acid, Venous: 1.23 mmol/L (ref 0.5–2.2)

## 2012-07-28 LAB — COMPREHENSIVE METABOLIC PANEL
ALT: 14 U/L (ref 0–35)
AST: 15 U/L (ref 0–37)
Albumin: 3.8 g/dL (ref 3.5–5.2)
CO2: 27 mEq/L (ref 19–32)
Chloride: 102 mEq/L (ref 96–112)
Creatinine, Ser: 0.7 mg/dL (ref 0.50–1.10)
Potassium: 3.8 mEq/L (ref 3.5–5.1)
Sodium: 139 mEq/L (ref 135–145)
Total Bilirubin: 0.2 mg/dL — ABNORMAL LOW (ref 0.3–1.2)

## 2012-07-28 LAB — OCCULT BLOOD, POC DEVICE: Fecal Occult Bld: NEGATIVE

## 2012-07-28 MED ORDER — DIPHENHYDRAMINE HCL 50 MG/ML IJ SOLN
50.0000 mg | Freq: Once | INTRAMUSCULAR | Status: AC
  Administered 2012-07-28: 50 mg via INTRAVENOUS
  Filled 2012-07-28: qty 1

## 2012-07-28 MED ORDER — IOHEXOL 300 MG/ML  SOLN
100.0000 mL | Freq: Once | INTRAMUSCULAR | Status: AC | PRN
  Administered 2012-07-28: 80 mL via INTRAVENOUS

## 2012-07-28 MED ORDER — HYDROCORTISONE NA SUCCINATE PF 250 MG IJ SOLR
200.0000 mg | Freq: Once | INTRAMUSCULAR | Status: AC
  Administered 2012-07-28: 200 mg via INTRAVENOUS
  Filled 2012-07-28: qty 200

## 2012-07-28 MED ORDER — ONDANSETRON HCL 4 MG/2ML IJ SOLN
4.0000 mg | Freq: Once | INTRAMUSCULAR | Status: AC
  Administered 2012-07-28: 4 mg via INTRAVENOUS
  Filled 2012-07-28: qty 2

## 2012-07-28 MED ORDER — LORAZEPAM 2 MG/ML IJ SOLN
1.0000 mg | Freq: Once | INTRAMUSCULAR | Status: AC
  Administered 2012-07-28: 1 mg via INTRAVENOUS
  Filled 2012-07-28: qty 1

## 2012-07-28 NOTE — ED Notes (Signed)
Patient transported to CT 

## 2012-07-28 NOTE — ED Provider Notes (Signed)
Pt presents with, several symptoms including abdominal pain, nausea, diarrhea last week but acutely overnight she had an episode where she felt like she was burning up, her about bilateral arms, legs and head felt like they were burning, she developed a short headache which completely resolved spontaneously. She never had any visual changes, she has had a poor appetite and in general has many systemic symptoms which are poorly described and vaguely described per the patient and her family members. Of note she did take Zoloft for the first time last night for her anxiety.  On exam she has a soft abdomen which is tender in the left lower quadrant, there is no peritoneal signs, it is nonsurgical. Heart and lung sounds are normal, neurologic exam is fairly normal with normal strength and sensation of all 4 extremities, normal speech and coordination, normal memory.  She will evaluation with basic laboratory workup, imaging of her abdomen for the left lower quadrant pain that she has had some dark and tarry stools. She does take anti-inflammatories, she was using Pepto-Bismol last week and does take iron so there are multiple etiologies for potential melena.   ED ECG REPORT  I personally interpreted this EKG   Date: 07/28/2012   Rate: 74  Rhythm: normal sinus rhythm  QRS Axis: normal  Intervals: normal  ST/T Wave abnormalities: nonspecific T wave changes  Conduction Disutrbances:none  Narrative Interpretation:   Old EKG Reviewed: 06/28/12, no sig changes  I saw and evaluated the patient, reviewed the resident's note and I agree with the findings and plan.   I personally interpreted the EKG as well as the resident and agree with the interpretation on the resident's chart.   Vida Roller, MD 07/28/12 [phone]

## 2012-07-28 NOTE — ED Notes (Signed)
Ct called & notified pt finished drinking oral contrast

## 2012-07-28 NOTE — Progress Notes (Signed)
Visited with pt.after talking with pt.'s daughter who is a [employer] employee in enviromental  services. Pt is alert and waiting on additional test. Pt. son is a little upset. He indicated that pt. has been here several time and no one can seem to find out what's wrong with pt. Provided words of comfort and encouragement. Will follow as needed.  07/28/12 1100  Clinical Encounter Type  Visited With Patient and family together;Health care provider  Visit Type Spiritual support;ED (staff support)  Referral From Other (Comment) (self iniated)  Spiritual Encounters  Spiritual Needs Emotional  Stress Factors  Patient Stress Factors Other (Comment) (elevated)  Family Stress Factors Exhausted  Venida Jarvis, [place] [phone]

## 2012-07-28 NOTE — ED Notes (Signed)
Awoke this morning with h/a, high BP & felt shaky. Reports feels this way when BP is high. Pt took BP meds at 0400 along with percocet. Saw PCP yesterday & had new med added

## 2012-07-28 NOTE — ED Notes (Signed)
States had h/a & felt like BP was high. Took BP med early at 0400. Upon arrival to ED denies any pain, h/a, CP. C/o "feeling bad. I feel like I'm burning up inside". Pt reports saw PCP yesterday & MD discontinued her ativan & started zoloft. States she took her last ativan yesterday.

## 2012-07-28 NOTE — ED Notes (Signed)
ED MD at bedside.Pt very anxious, shakey, crying. Stated, "I think I'm going through withdrawals from that medicine. I feel really bad". Pt reassured & comforted. Family remains at bedside. Pre-med given for CT scan, will monitor

## 2012-07-28 NOTE — ED Provider Notes (Signed)
History    CSN: [id number] Arrival date & time 07/28/12  0905  First MD Initiated Contact with Patient 07/28/12 [phone]     Chief Complaint  Patient presents with  . Headache   (Consider location/radiation/quality/duration/timing/severity/associated sxs/prior Treatment) HPI Comments: Pt w/ multiple co-morbidities now w/ multiple vague complaints. Woke up this am "felt like I was on fire", these sx were a/w brief mild HA that has since resolved, also a/w tremors and generalized weakness - worse in both legs. States these sx have been occuring intermittently over past several months. Has been seen in ED monthly for similar sx w/ unrevealing work up. CT head [facility] 2/14. Recently initiated on zoloft yesterday took first dose last pm and again this am. + hx of anxiety - recently weaned off of lorazepam, pts is on methotrexate for RA and synthroid for hypothyroidism. Pt also admits to vague ongoing diffuse abd pain x several wks a/w dark melanotic stool. Denies fever, blurred vision, diplopia, unilateral numbness/weakness, confusion, no n/v/d/c. No urinary sx.   Patient is a 79 y.o. female presenting with general illness. The history is provided by the patient. No language interpreter was used.  Illness Location:  Generalized Quality:  Abd pain, HA, weakness, tremors Severity:  Mild Onset quality:  Sudden Timing:  Intermittent Progression:  Resolved Chronicity:  Recurrent Associated symptoms: abdominal pain and headaches   Associated symptoms: no chest pain, no congestion, no cough, no diarrhea, no fever, no nausea, no rash, no shortness of breath, no sore throat and no vomiting    Past Medical History  Diagnosis Date  . Hypertension   . Shortness of breath     occas  . Arthritis     RA  . Hypothyroidism   . Hyperlipidemia   . GERD (gastroesophageal reflux disease)   . Anxiety     HX OF PANIC ATTACKS   Past Surgical History  Procedure Laterality Date  . Joint replacement  1998   left hip replacement and bilateral elbow replacements  about 15 yrs ago  . Thyroidectomy    . Total hip revision  03/11/2011    Procedure: TOTAL HIP REVISION;  Surgeon: [name], MD;  Location: [facility];  Service: Orthopedics;  Laterality: Left;  . Total hip arthroplasty     No family history on file. History  Substance Use Topics  . Smoking status: Former Games developer  . Smokeless tobacco: Never Used     Comment: QUIT SMOKING IN THE 70"S  . Alcohol Use: No   OB History   Grav Para Term Preterm Abortions TAB SAB Ect Mult Living                 Review of Systems  Constitutional: Negative for fever and chills.  HENT: Negative for congestion and sore throat.   Respiratory: Negative for cough and shortness of breath.   Cardiovascular: Negative for chest pain and leg swelling.  Gastrointestinal: Positive for abdominal pain. Negative for nausea, vomiting, diarrhea and constipation.  Genitourinary: Negative for dysuria and frequency.  Skin: Negative for color change and rash.  Neurological: Positive for tremors, weakness and headaches. Negative for dizziness, syncope, speech difficulty, light-headedness and numbness.  Psychiatric/Behavioral: Negative for confusion and agitation.  All other systems reviewed and are negative.    Allergies  Contrast media  Home Medications   Current Outpatient Rx  Name  Route  Sig  Dispense  Refill  . amLODipine (NORVASC) 5 MG tablet   Oral   Take 5 mg  by mouth every morning.         Marland Kitchen aspirin 325 MG tablet   Oral   Take 325 mg by mouth daily.         . benazepril (LOTENSIN) 20 MG tablet   Oral   Take 20 mg by mouth every morning.         . cholecalciferol (VITAMIN D) 1000 UNITS tablet   Oral   Take 2,000 Units by mouth daily.         . fexofenadine (ALLEGRA) 180 MG tablet   Oral   Take 180 mg by mouth daily.         . fluticasone (FLONASE) 50 MCG/ACT nasal spray   Nasal   Place 2 sprays into the nose daily.         .  folic acid (FOLVITE) 1 MG tablet   Oral   Take 1 mg by mouth daily.         . furosemide (LASIX) 20 MG tablet   Oral   Take 20 mg by mouth every 3 (three) days.          Marland Kitchen levothyroxine (SYNTHROID, LEVOTHROID) 100 MCG tablet   Oral   Take 100 mcg by mouth daily before breakfast.         . methotrexate (RHEUMATREX) 2.5 MG tablet   Oral   Take 22.5 mg by mouth once a week. Caution:Chemotherapy. Protect from light.  mondays         . Omega-3 Fatty Acids (FISH OIL) 1000 MG CAPS   Oral   Take 1 capsule by mouth daily.         Marland Kitchen omeprazole (PRILOSEC) 20 MG capsule   Oral   Take 20 mg by mouth daily.         Marland Kitchen OVER THE COUNTER MEDICATION   Oral   Take 1 tablet by mouth daily. Iron         . oxyCODONE-acetaminophen (PERCOCET) 7.5-325 MG per tablet   Oral   Take 1 tablet by mouth 4 (four) times daily.         . predniSONE (DELTASONE) 5 MG tablet   Oral   Take 1 tablet (5 mg total) by mouth daily.   15 tablet   0   . rosuvastatin (CRESTOR) 20 MG tablet   Oral   Take 10 mg by mouth every 3 (three) days.           BP 147/61  Temp(Src) 97.6 F (36.4 C) (Oral)  Resp 16  Ht 5\' 2"  (1.575 m)  Wt 175 lb (79.379 kg)  BMI 32 kg/m2  SpO2 97% Physical Exam  Vitals reviewed. Constitutional: She is oriented to person, place, and time. She appears well-developed and well-nourished. No distress.  HENT:  Head: Normocephalic and atraumatic.  Eyes: EOM are normal. Pupils are equal, round, and reactive to light.  Neck: Normal range of motion. Neck supple.  Cardiovascular: Normal rate and regular rhythm.   Pulmonary/Chest: Effort normal and breath sounds normal. No respiratory distress.  Abdominal: Soft. She exhibits no distension. There is tenderness in the left lower quadrant. There is guarding. There is no rigidity, no rebound, no CVA tenderness, no tenderness at McBurney's point and negative Murphy's sign.    Genitourinary: Guaiac negative stool.    Musculoskeletal: Normal range of motion. She exhibits no edema.  Neurological: She is alert and oriented to person, place, and time. She has normal strength. No cranial nerve deficit or sensory deficit.  Coordination normal. GCS eye subscore is 4. GCS verbal subscore is 5. GCS motor subscore is 6.  Skin: Skin is warm and dry. No rash noted.  Psychiatric: She has a normal mood and affect. Her behavior is normal.    ED Course  Procedures (including critical care time) Labs Reviewed  CBC WITH DIFFERENTIAL  COMPREHENSIVE METABOLIC PANEL  URINALYSIS, ROUTINE W REFLEX MICROSCOPIC   Results for orders placed during the hospital encounter of 07/28/12  CBC WITH DIFFERENTIAL      Result Value Range   WBC 6.7  4.0 - 10.5 K/uL   RBC 4.15  3.87 - 5.11 MIL/uL   Hemoglobin 12.2  12.0 - 15.0 g/dL   HCT 40.9  81.1 - 91.4 %   MCV 90.8  78.0 - 100.0 fL   MCH 29.4  26.0 - 34.0 pg   MCHC 32.4  30.0 - 36.0 g/dL   RDW 78.2 (*) 95.6 - 21.3 %   Platelets 381  150 - 400 K/uL   Neutrophils Relative % 73  43 - 77 %   Neutro Abs 4.9  1.7 - 7.7 K/uL   Lymphocytes Relative 17  12 - 46 %   Lymphs Abs 1.1  0.7 - 4.0 K/uL   Monocytes Relative 10  3 - 12 %   Monocytes Absolute 0.6  0.1 - 1.0 K/uL   Eosinophils Relative 0  0 - 5 %   Eosinophils Absolute 0.0  0.0 - 0.7 K/uL   Basophils Relative 0  0 - 1 %   Basophils Absolute 0.0  0.0 - 0.1 K/uL  COMPREHENSIVE METABOLIC PANEL      Result Value Range   Sodium 139  135 - 145 mEq/L   Potassium 3.8  3.5 - 5.1 mEq/L   Chloride 102  96 - 112 mEq/L   CO2 27  19 - 32 mEq/L   Glucose, Bld 154 (*) 70 - 99 mg/dL   BUN 13  6 - 23 mg/dL   Creatinine, Ser 0.86  0.50 - 1.10 mg/dL   Calcium 9.1  8.4 - 57.8 mg/dL   Total Protein 6.5  6.0 - 8.3 g/dL   Albumin 3.8  3.5 - 5.2 g/dL   AST 15  0 - 37 U/L   ALT 14  0 - 35 U/L   Alkaline Phosphatase 90  39 - 117 U/L   Total Bilirubin 0.2 (*) 0.3 - 1.2 mg/dL   GFR calc non Af Amer 79 (*) >90 mL/min   GFR calc Af Amer >90  >90  mL/min  URINALYSIS, ROUTINE W REFLEX MICROSCOPIC      Result Value Range   Color, Urine YELLOW  YELLOW   APPearance HAZY (*) CLEAR   Specific Gravity, Urine 1.013  1.005 - 1.030   pH 8.0  5.0 - 8.0   Glucose, UA NEGATIVE  NEGATIVE mg/dL   Hgb urine dipstick NEGATIVE  NEGATIVE   Bilirubin Urine NEGATIVE  NEGATIVE   Ketones, ur NEGATIVE  NEGATIVE mg/dL   Protein, ur NEGATIVE  NEGATIVE mg/dL   Urobilinogen, UA 0.2  0.0 - 1.0 mg/dL   Nitrite NEGATIVE  NEGATIVE   Leukocytes, UA NEGATIVE  NEGATIVE  OCCULT BLOOD, POC DEVICE      Result Value Range   Fecal Occult Bld NEGATIVE  NEGATIVE  POCT I-STAT, CHEM 8      Result Value Range   Sodium 140  135 - 145 mEq/L   Potassium 3.6  3.5 - 5.1 mEq/L   Chloride  103  96 - 112 mEq/L   BUN 12  6 - 23 mg/dL   Creatinine, Ser 1.61  0.50 - 1.10 mg/dL   Glucose, Bld 096 (*) 70 - 99 mg/dL   Calcium, Ion 0.45 (*) 1.13 - 1.30 mmol/L   TCO2 25  0 - 100 mmol/L   Hemoglobin 13.6  12.0 - 15.0 g/dL   HCT 40.9  81.1 - 91.4 %  CG4 I-STAT (LACTIC ACID)      Result Value Range   Lactic Acid, Venous 1.23  0.5 - 2.2 mmol/L    CT Abdomen Pelvis W Contrast (Final result)  Result time: 07/28/12 13:56:05    Final result by Rad Results In Interface (07/28/12 13:56:05)    Narrative:   *RADIOLOGY REPORT*  Clinical Data: Abdominal pain, nausea.  CT ABDOMEN AND PELVIS WITH CONTRAST  Technique: Multidetector CT imaging of the abdomen and pelvis was performed following the standard protocol during bolus administration of intravenous contrast.  Contrast: 80mL OMNIPAQUE IOHEXOL 300 MG/ML SOLN  Comparison: None.  Findings: Small hiatal hernia. 6 mm left lower lobe pulmonary nodule on image 17. 5 mm left lower lobe nodule on image 14. No effusions. Heart is normal size.  Liver, gallbladder, spleen, pancreas, adrenals and kidneys are unremarkable except for A 6 mm nonobstructing stone in the lower pole of the left kidney.  Stomach, large and small bowel are  grossly unremarkable. 5.6 cm cystic mass in the left ovary. This was seen on prior pelvic ultrasound when this measured 8.5 cm in 2008. Uterus and right ovary unremarkable. No free fluid, free air or adenopathy.  Prior left hip replacement. No acute bony abnormality.  IMPRESSION: Left nephrolithiasis.  5.6 cm cystic mass in the left ovary. When compared to prior ultrasound, this has decreased in size.  Small hiatal hernia.  Small left lower lobe pulmonary nodules. If the patient is at high risk for bronchogenic carcinoma, follow-up chest CT at 6-12 months is recommended. If the patient is at low risk for bronchogenic carcinoma, follow-up chest CT at 12 months is recommended. This recommendation follows the consensus statement: Guidelines for Management of Small Pulmonary Nodules Detected on CT Scans: A Statement from the Fleischner Society as published in Radiology 2005; 237:395-400.   Original Report Authenticated By: Charlett Nose, M.D.     Date: 07/28/2012  Rate: 82  Rhythm: normal sinus rhythm  QRS Axis: normal  Intervals: normal  ST/T Wave abnormalities: nonspecific ST/T changes  Conduction Disutrbances:none  Narrative Interpretation:   Old EKG Reviewed: unchanged    No results found. No diagnosis found.  MDM  Exam as above, NAD, well appearing, vitals unremarkable, afebrile, no focal neuro deficit, mild ttp LLQ w/out rebound or guarding, no clinical peritonitis, not a surgical abdomen. ECG w/out acute ischemia. Sx are very vague - doubt serotonin syndrome or CVA based on exam. Recently started zoloft - ? Side effect vs acute benzo w/d. CT abd obtained to eval for possible diverticulitis - no acute findings - incidental pulm nodule noted and family aware of need to follow up. U/a neg for infection or hematuria, hemoccult stool negative, lactic acid normal, no anemia or leukocytosis, CMP unremarkable. Reassessed - persistent agitation - given ativan 1mg  w/ complete  resolution of sx. D/w pts PCP - Dr Waynard Edwards - instructed to not stop ativan - needs to wean while zoloft takes effect. pcp called in Rx to her pharmacy and will see her back in clinic this wk. Stable for d/c home at this time  doubt CVA, acute infectious or surgical etiology. Likely ativan w/d and anxiety. D/c home in good and stable condition. Given return precautions.  I have personally reviewed labs and imaging and considered in my MDM. Case d/w Dr Hyacinth Meeker  1. Anxiety   2. Weakness   3. Fatigue    Ezequiel Kayser, MD [address] [address] [phone]  Schedule an appointment as soon as possible for a visit they will see you in the clinic this week     Audelia Hives, MD 07/28/12 1547

## 2012-07-28 NOTE — ED Notes (Signed)
Lactic acid results told to Dr. Hyacinth Meeker

## 2012-07-29 NOTE — ED Provider Notes (Signed)
I saw and evaluated the patient, reviewed the resident's note and I agree with the findings and plan.  Please see my separate note regarding my evaluation of the patient.   Vida Roller, MD 07/29/12 [phone]

## 2012-07-30 DIAGNOSIS — M545 Low back pain: Secondary | ICD-10-CM | POA: Diagnosis not present

## 2012-07-30 DIAGNOSIS — Z79899 Other long term (current) drug therapy: Secondary | ICD-10-CM | POA: Diagnosis not present

## 2012-07-30 DIAGNOSIS — E039 Hypothyroidism, unspecified: Secondary | ICD-10-CM | POA: Diagnosis not present

## 2012-07-30 DIAGNOSIS — F410 Panic disorder [episodic paroxysmal anxiety] without agoraphobia: Secondary | ICD-10-CM | POA: Diagnosis not present

## 2012-07-30 DIAGNOSIS — Z6833 Body mass index (BMI) 33.0-33.9, adult: Secondary | ICD-10-CM | POA: Diagnosis not present

## 2012-07-30 DIAGNOSIS — J984 Other disorders of lung: Secondary | ICD-10-CM | POA: Diagnosis not present

## 2012-08-05 DIAGNOSIS — R6889 Other general symptoms and signs: Secondary | ICD-10-CM | POA: Diagnosis not present

## 2012-08-20 DIAGNOSIS — M5137 Other intervertebral disc degeneration, lumbosacral region: Secondary | ICD-10-CM | POA: Diagnosis not present

## 2012-08-20 DIAGNOSIS — Z79899 Other long term (current) drug therapy: Secondary | ICD-10-CM | POA: Diagnosis not present

## 2012-08-20 DIAGNOSIS — IMO0001 Reserved for inherently not codable concepts without codable children: Secondary | ICD-10-CM | POA: Diagnosis not present

## 2012-08-20 DIAGNOSIS — G894 Chronic pain syndrome: Secondary | ICD-10-CM | POA: Diagnosis not present

## 2012-08-20 DIAGNOSIS — M461 Sacroiliitis, not elsewhere classified: Secondary | ICD-10-CM | POA: Diagnosis not present

## 2012-08-27 DIAGNOSIS — J984 Other disorders of lung: Secondary | ICD-10-CM | POA: Diagnosis not present

## 2012-08-27 DIAGNOSIS — Z6833 Body mass index (BMI) 33.0-33.9, adult: Secondary | ICD-10-CM | POA: Diagnosis not present

## 2012-08-27 DIAGNOSIS — Z1331 Encounter for screening for depression: Secondary | ICD-10-CM | POA: Diagnosis not present

## 2012-08-27 DIAGNOSIS — D649 Anemia, unspecified: Secondary | ICD-10-CM | POA: Diagnosis not present

## 2012-08-27 DIAGNOSIS — E039 Hypothyroidism, unspecified: Secondary | ICD-10-CM | POA: Diagnosis not present

## 2012-08-27 DIAGNOSIS — M545 Low back pain: Secondary | ICD-10-CM | POA: Diagnosis not present

## 2012-08-27 DIAGNOSIS — M810 Age-related osteoporosis without current pathological fracture: Secondary | ICD-10-CM | POA: Diagnosis not present

## 2012-08-27 DIAGNOSIS — I10 Essential (primary) hypertension: Secondary | ICD-10-CM | POA: Diagnosis not present

## 2012-09-07 ENCOUNTER — Encounter (HOSPITAL_COMMUNITY): Payer: Self-pay

## 2012-09-07 ENCOUNTER — Emergency Department (HOSPITAL_COMMUNITY)
Admission: EM | Admit: 2012-09-07 | Discharge: 2012-09-07 | Disposition: A | Discharge status: Home / Self Care | Payer: Medicare Other | Attending: Emergency Medicine | Admitting: Emergency Medicine

## 2012-09-07 ENCOUNTER — Emergency Department (HOSPITAL_COMMUNITY): Payer: Medicare Other

## 2012-09-07 DIAGNOSIS — Z79899 Other long term (current) drug therapy: Secondary | ICD-10-CM | POA: Insufficient documentation

## 2012-09-07 DIAGNOSIS — E039 Hypothyroidism, unspecified: Secondary | ICD-10-CM | POA: Diagnosis not present

## 2012-09-07 DIAGNOSIS — R1310 Dysphagia, unspecified: Secondary | ICD-10-CM | POA: Diagnosis not present

## 2012-09-07 DIAGNOSIS — K219 Gastro-esophageal reflux disease without esophagitis: Secondary | ICD-10-CM | POA: Diagnosis not present

## 2012-09-07 DIAGNOSIS — Z7982 Long term (current) use of aspirin: Secondary | ICD-10-CM | POA: Diagnosis not present

## 2012-09-07 DIAGNOSIS — E785 Hyperlipidemia, unspecified: Secondary | ICD-10-CM | POA: Diagnosis not present

## 2012-09-07 DIAGNOSIS — Z87891 Personal history of nicotine dependence: Secondary | ICD-10-CM | POA: Insufficient documentation

## 2012-09-07 DIAGNOSIS — R51 Headache: Secondary | ICD-10-CM | POA: Diagnosis not present

## 2012-09-07 DIAGNOSIS — I10 Essential (primary) hypertension: Secondary | ICD-10-CM | POA: Diagnosis not present

## 2012-09-07 DIAGNOSIS — M129 Arthropathy, unspecified: Secondary | ICD-10-CM | POA: Diagnosis not present

## 2012-09-07 DIAGNOSIS — F411 Generalized anxiety disorder: Secondary | ICD-10-CM | POA: Insufficient documentation

## 2012-09-07 LAB — BASIC METABOLIC PANEL
CO2: 26 mEq/L (ref 19–32)
Chloride: 101 mEq/L (ref 96–112)
Glucose, Bld: 122 mg/dL — ABNORMAL HIGH (ref 70–99)
Sodium: 138 mEq/L (ref 135–145)

## 2012-09-07 LAB — CBC WITH DIFFERENTIAL/PLATELET
Eosinophils Relative: 3 % (ref 0–5)
HCT: 39.4 % (ref 36.0–46.0)
Lymphocytes Relative: 13 % (ref 12–46)
Lymphs Abs: 0.9 10*3/uL (ref 0.7–4.0)
MCV: 91.8 fL (ref 78.0–100.0)
Monocytes Absolute: 0.8 10*3/uL (ref 0.1–1.0)
Monocytes Relative: 11 % (ref 3–12)
RBC: 4.29 MIL/uL (ref 3.87–5.11)
WBC: 6.9 10*3/uL (ref 4.0–10.5)

## 2012-09-07 MED ORDER — ACETAMINOPHEN 325 MG PO TABS: 650.0000 mg | ORAL_TABLET | Freq: Once | ORAL | Status: DC

## 2012-09-07 MED ORDER — OXYCODONE-ACETAMINOPHEN 5-325 MG PO TABS
1.0000 | ORAL_TABLET | Freq: Once | ORAL | Status: AC
  Administered 2012-09-07: 1 via ORAL
  Filled 2012-09-07: qty 1

## 2012-09-07 NOTE — ED Notes (Signed)
1610  Pt arrives from home with HTN and her head feels funny.  Pt is also having difficulty swallowing.  Denies N/V/D.  Pt A&O X 4 with family at bedside

## 2012-09-07 NOTE — ED Provider Notes (Signed)
CSN: [id number]     Arrival date & time 09/07/12  0631 History   First MD Initiated Contact with Patient 09/07/12 [phone]     Chief Complaint  Patient presents with  . Hypertension   (Consider location/radiation/quality/duration/timing/severity/associated sxs/prior Treatment) HPI Comments: 79 yo female with RA, osteoarthritis, non smoker, no etoh presents with head pressure and difficulty swallowing since waking up this am.  Pt felt normal last night.  No stroke or a fib hx.  No cardiac hx.  Pt says head feels funny when she awoke, checked bp and 170s.  It has been running higher than normal, no recent changes, she took meds this am.  Improvement in sxs but still persistent.  Nothing worsens.  Pt has had vague different sxs when bp elevated.   No choking episode.  Generalized pressure/ burning sensation.    Patient is a 79 y.o. female presenting with hypertension. The history is provided by the patient.  Hypertension This is a new problem. Associated symptoms include headaches (pressure ("not pain")). Pertinent negatives include no chest pain, no abdominal pain and no shortness of breath.    Past Medical History  Diagnosis Date  . Hypertension   . Shortness of breath     occas  . Arthritis     RA  . Hypothyroidism   . Hyperlipidemia   . GERD (gastroesophageal reflux disease)   . Anxiety     HX OF PANIC ATTACKS   Past Surgical History  Procedure Laterality Date  . Joint replacement  1998    left hip replacement and bilateral elbow replacements  about 15 yrs ago  . Thyroidectomy    . Total hip revision  03/11/2011    Procedure: TOTAL HIP REVISION;  Surgeon: [name], MD;  Location: [facility];  Service: Orthopedics;  Laterality: Left;  . Total hip arthroplasty     No family history on file. History  Substance Use Topics  . Smoking status: Former Games developer  . Smokeless tobacco: Never Used     Comment: QUIT SMOKING IN THE 70"S  . Alcohol Use: No   OB History   Grav Para Term  Preterm Abortions TAB SAB Ect Mult Living                 Review of Systems  Constitutional: Negative for fever and chills.  HENT: Negative for neck pain and neck stiffness.   Eyes: Negative for visual disturbance.  Respiratory: Negative for shortness of breath.   Cardiovascular: Negative for chest pain.  Gastrointestinal: Negative for vomiting and abdominal pain.  Genitourinary: Negative for dysuria and flank pain.  Musculoskeletal: Negative for back pain.  Skin: Negative for rash.  Neurological: Positive for headaches (pressure ("not pain")). Negative for light-headedness.    Allergies  Contrast media  Home Medications   Current Outpatient Rx  Name  Route  Sig  Dispense  Refill  . alendronate (FOSAMAX) 70 MG tablet   Oral   Take 70 mg by mouth every 7 (seven) days. Take with a full glass of water on an empty stomach. Takes on Saturday         . amLODipine (NORVASC) 5 MG tablet   Oral   Take 5 mg by mouth every morning.         Marland Kitchen aspirin 325 MG tablet   Oral   Take 325 mg by mouth daily.         . benazepril (LOTENSIN) 20 MG tablet   Oral   Take 20  mg by mouth every morning.         . cholecalciferol (VITAMIN D) 1000 UNITS tablet   Oral   Take 2,000 Units by mouth daily.         . fentaNYL (DURAGESIC - DOSED MCG/HR) 25 MCG/HR   Transdermal   Place 1 patch onto the skin every 3 (three) days.         . fexofenadine (ALLEGRA) 180 MG tablet   Oral   Take 180 mg by mouth daily as needed (For allergies.).          Marland Kitchen fluticasone (FLONASE) 50 MCG/ACT nasal spray   Nasal   Place 2 sprays into the nose daily.          . folic acid (FOLVITE) 1 MG tablet   Oral   Take 1 mg by mouth daily.         . furosemide (LASIX) 20 MG tablet   Oral   Take 20 mg by mouth every other day as needed for fluid (For ankle swelling).          Marland Kitchen levothyroxine (SYNTHROID, LEVOTHROID) 100 MCG tablet   Oral   Take 100 mcg by mouth daily before breakfast.          . LORazepam (ATIVAN) 0.5 MG tablet   Oral   Take 0.25-5 mg by mouth 2 (two) times daily as needed for anxiety.         . methotrexate (RHEUMATREX) 2.5 MG tablet   Oral   Take 22.5 mg by mouth once a week. Caution:Chemotherapy. Protect from light.  mondays         . Omega-3 Fatty Acids (FISH OIL) 1000 MG CAPS   Oral   Take 1 capsule by mouth daily.         Marland Kitchen omeprazole (PRILOSEC) 20 MG capsule   Oral   Take 20 mg by mouth every other day.          Marland Kitchen OVER THE COUNTER MEDICATION   Oral   Take 1 tablet by mouth every other day. Iron         . oxyCODONE-acetaminophen (PERCOCET) 7.5-325 MG per tablet   Oral   Take 1 tablet by mouth every 4 (four) hours as needed for pain.         . predniSONE (DELTASONE) 5 MG tablet   Oral   Take 1 tablet (5 mg total) by mouth daily.   15 tablet   0   . rosuvastatin (CRESTOR) 20 MG tablet   Oral   Take 10 mg by mouth every 3 (three) days.          Marland Kitchen sertraline (ZOLOFT) 50 MG tablet   Oral   Take 50 mg by mouth daily.           BP 152/74  Pulse 109  Temp(Src) 97.9 F (36.6 C)  Resp 18  SpO2 96% Physical Exam  Nursing note and vitals reviewed. Constitutional: She is oriented to person, place, and time. She appears well-developed and well-nourished.  HENT:  Head: Normocephalic and atraumatic.  Eyes: Conjunctivae are normal. Right eye exhibits no discharge. Left eye exhibits no discharge.  Neck: Normal range of motion. Neck supple. No tracheal deviation present.  Cardiovascular: Normal rate and regular rhythm.   Pulmonary/Chest: Effort normal and breath sounds normal.  Abdominal: Soft. She exhibits no distension. There is no tenderness. There is no guarding.  Musculoskeletal: She exhibits no edema.  Neurological: She is alert and  oriented to person, place, and time. No cranial nerve deficit. GCS eye subscore is 4. GCS verbal subscore is 5. GCS motor subscore is 6.  5+ strength in UE and LE with f/e at major  joints. Sensation to palpation intact in UE and LE. CNs 2-12 grossly intact.  EOMFI.  PERRL.   Finger nose and coordination intact bilateral.   Visual fields intact to finger testing. No carotid bruits   Skin: Skin is warm. No rash noted.  Psychiatric: She has a normal mood and affect.    ED Course  Procedures (including critical care time) Labs Review Labs Reviewed  BASIC METABOLIC PANEL - Abnormal; Notable for the following:    Potassium 3.4 (*)    Glucose, Bld 122 (*)    GFR calc non Af Amer 85 (*)    All other components within normal limits  CBC WITH DIFFERENTIAL - Abnormal; Notable for the following:    RDW 17.4 (*)    All other components within normal limits    Date: 09/07/2012  Rate: 92  Rhythm: normal sinus rhythm  QRS Axis: left  Intervals: QT prolonged  ST/T Wave abnormalities: nonspecific ST changes subtle depressions inf and alteral similar previous  Conduction Disutrbances:none  Narrative Interpretation:   Old EKG Reviewed: unchanged   Imaging Review No results found.  MDM  No diagnosis found. Head pressure, high bp and feeling funny. EKG reviewed, similar previous.  CT to rule out bleed. Normal neuro exam.  Likely vague sensation from bp being elevated more than normal this am but with age will look for signs of stroke and reassess.  Pt had MRI fairly recently with numbness and neuro sxs without stroke on MRI report, reviewed.  Pt well appearing.  Normal neuro exam.  Sxs resolved in ED.  Discussed MRI vs close outpt fup.  With atypical sxs,  Likely vague sxs from bp being elevated, vitals unremarkable in ED, observed.  Daughter with pt. Strict reasons to return given.  Ct Head Wo Contrast  09/07/2012   *RADIOLOGY REPORT*  Clinical Data: Hypertension, difficulty swallowing  CT HEAD WITHOUT CONTRAST  Technique:  Contiguous axial images were obtained from the base of the skull through the vertex without contrast.  Comparison: 02/15/2012  Findings: The bony  calvarium is intact.  Mild atrophic changes are seen.  No findings to suggest acute hemorrhage, acute infarction or space-occupying mass lesion are noted.  IMPRESSION: Chronic changes without acute abnormality.   Original Report Authenticated By: Alcide Clever, M.D.    Enid Skeens, MD 09/07/12 [phone]

## 2012-09-08 DIAGNOSIS — M810 Age-related osteoporosis without current pathological fracture: Secondary | ICD-10-CM | POA: Diagnosis not present

## 2012-09-08 DIAGNOSIS — Z6832 Body mass index (BMI) 32.0-32.9, adult: Secondary | ICD-10-CM | POA: Diagnosis not present

## 2012-09-08 DIAGNOSIS — I10 Essential (primary) hypertension: Secondary | ICD-10-CM | POA: Diagnosis not present

## 2012-09-14 DIAGNOSIS — Z79899 Other long term (current) drug therapy: Secondary | ICD-10-CM | POA: Diagnosis not present

## 2012-09-14 DIAGNOSIS — IMO0001 Reserved for inherently not codable concepts without codable children: Secondary | ICD-10-CM | POA: Diagnosis not present

## 2012-09-14 DIAGNOSIS — G894 Chronic pain syndrome: Secondary | ICD-10-CM | POA: Diagnosis not present

## 2012-09-14 DIAGNOSIS — M79609 Pain in unspecified limb: Secondary | ICD-10-CM | POA: Diagnosis not present

## 2012-09-17 DIAGNOSIS — M47817 Spondylosis without myelopathy or radiculopathy, lumbosacral region: Secondary | ICD-10-CM | POA: Diagnosis not present

## 2012-09-19 ENCOUNTER — Emergency Department (HOSPITAL_COMMUNITY)
Admission: EM | Admit: 2012-09-19 | Discharge: 2012-09-19 | Disposition: A | Discharge status: Home / Self Care | Payer: Medicare Other | Attending: Emergency Medicine | Admitting: Emergency Medicine

## 2012-09-19 ENCOUNTER — Encounter (HOSPITAL_COMMUNITY): Payer: Self-pay | Admitting: *Deleted

## 2012-09-19 DIAGNOSIS — E039 Hypothyroidism, unspecified: Secondary | ICD-10-CM | POA: Insufficient documentation

## 2012-09-19 DIAGNOSIS — M129 Arthropathy, unspecified: Secondary | ICD-10-CM | POA: Insufficient documentation

## 2012-09-19 DIAGNOSIS — E785 Hyperlipidemia, unspecified: Secondary | ICD-10-CM | POA: Diagnosis not present

## 2012-09-19 DIAGNOSIS — Z79899 Other long term (current) drug therapy: Secondary | ICD-10-CM | POA: Diagnosis not present

## 2012-09-19 DIAGNOSIS — I10 Essential (primary) hypertension: Secondary | ICD-10-CM | POA: Insufficient documentation

## 2012-09-19 DIAGNOSIS — Z87891 Personal history of nicotine dependence: Secondary | ICD-10-CM | POA: Insufficient documentation

## 2012-09-19 DIAGNOSIS — R0602 Shortness of breath: Secondary | ICD-10-CM | POA: Diagnosis not present

## 2012-09-19 DIAGNOSIS — Z7982 Long term (current) use of aspirin: Secondary | ICD-10-CM | POA: Insufficient documentation

## 2012-09-19 DIAGNOSIS — F411 Generalized anxiety disorder: Secondary | ICD-10-CM | POA: Diagnosis not present

## 2012-09-19 DIAGNOSIS — K219 Gastro-esophageal reflux disease without esophagitis: Secondary | ICD-10-CM | POA: Insufficient documentation

## 2012-09-19 DIAGNOSIS — R030 Elevated blood-pressure reading, without diagnosis of hypertension: Secondary | ICD-10-CM

## 2012-09-19 MED ORDER — ACETAMINOPHEN 325 MG PO TABS
650.0000 mg | ORAL_TABLET | Freq: Once | ORAL | Status: AC
  Administered 2012-09-19: 650 mg via ORAL
  Filled 2012-09-19: qty 2

## 2012-09-19 NOTE — ED Provider Notes (Signed)
Medical screening examination/treatment/procedure(s) were performed by non-physician practitioner and as supervising physician I was immediately available for consultation/collaboration.  Arwa Yero, MD 09/19/12 1641 

## 2012-09-19 NOTE — ED Provider Notes (Signed)
CSN: [id number]     Arrival date & time 09/19/12  1009 History   First MD Initiated Contact with Patient 09/19/12 1115     Chief Complaint  Patient presents with  . Hypertension   (Consider location/radiation/quality/duration/timing/severity/associated sxs/prior Treatment) HPI  79 year old female with history of anxiety, hypertension, arthritis, and hypothyroidism here for evaluations of high blood pressure. Patient reports she was awoke this morning, noticed that she was sweaty, check her blood pressure and states it was reading in the 200s systolic. She felt anxious and also report having some intermittent shortness of breath. The elevated blood pressure and sounds are prompted her to come to the ER today. While waiting ER she noticed some mild headache described as an achy sensation throughout her head, minimal, having similar headache like this in the past. Aside from her high blood pressure patient denies any significant fever, chills, vision changes, facial droop, trouble thinking, neck pain, chest pain, abdominal pain, nausea, vomiting, numbness, weakness or rash. Denies any medication changes. Has been taking her blood pressure medication as prescribed. Report having body sweats for the past several weeks but denies any significant history of cancer, myalgia, fever, or abnormal weight changes. Patient has been evaluated for high blood pressure in the past. Has had a normal brain MRI a few weeks ago. Patient states she has no prior history of PE or DVT. No history of cancer, not taking any exogenous hormone, no recent surgery or prolonged bed rest. Denies any significant leg swelling or calf pain. Has a history of RA and received a "injection for my arthritis "2 days ago. No history of stroke or atrial fibrillation. No significant cardiac disease.  Past Medical History  Diagnosis Date  . Hypertension   . Shortness of breath     occas  . Arthritis     RA  . Hypothyroidism   . Hyperlipidemia    . GERD (gastroesophageal reflux disease)   . Anxiety     HX OF PANIC ATTACKS   Past Surgical History  Procedure Laterality Date  . Joint replacement  1998    left hip replacement and bilateral elbow replacements  about 15 yrs ago  . Thyroidectomy    . Total hip revision  03/11/2011    Procedure: TOTAL HIP REVISION;  Surgeon: [name], MD;  Location: [facility];  Service: Orthopedics;  Laterality: Left;  . Total hip arthroplasty     History reviewed. No pertinent family history. History  Substance Use Topics  . Smoking status: Former Games developer  . Smokeless tobacco: Never Used     Comment: QUIT SMOKING IN THE 70"S  . Alcohol Use: No   OB History   Grav Para Term Preterm Abortions TAB SAB Ect Mult Living                 Review of Systems  All other systems reviewed and are negative.    Allergies  Contrast media  Home Medications   Current Outpatient Rx  Name  Route  Sig  Dispense  Refill  . alendronate (FOSAMAX) 70 MG tablet   Oral   Take 70 mg by mouth every 7 (seven) days. Take with a full glass of water on an empty stomach. Takes on Saturday         . amLODipine (NORVASC) 5 MG tablet   Oral   Take 5 mg by mouth every morning.         Marland Kitchen aspirin 325 MG tablet   Oral  Take 325 mg by mouth daily.         . benazepril (LOTENSIN) 20 MG tablet   Oral   Take 20 mg by mouth every morning.         . cholecalciferol (VITAMIN D) 1000 UNITS tablet   Oral   Take 2,000 Units by mouth daily.         . fentaNYL (DURAGESIC - DOSED MCG/HR) 25 MCG/HR   Transdermal   Place 1 patch onto the skin every 3 (three) days.         . fexofenadine (ALLEGRA) 180 MG tablet   Oral   Take 180 mg by mouth daily as needed (For allergies.).          Marland Kitchen fluticasone (FLONASE) 50 MCG/ACT nasal spray   Nasal   Place 2 sprays into the nose daily.          . folic acid (FOLVITE) 1 MG tablet   Oral   Take 1 mg by mouth daily.         . furosemide (LASIX) 20 MG tablet    Oral   Take 20 mg by mouth every other day as needed for fluid (For ankle swelling).          Marland Kitchen levothyroxine (SYNTHROID, LEVOTHROID) 100 MCG tablet   Oral   Take 100 mcg by mouth daily before breakfast.         . LORazepam (ATIVAN) 0.5 MG tablet   Oral   Take 0.25-5 mg by mouth 2 (two) times daily as needed for anxiety.         . methotrexate (RHEUMATREX) 2.5 MG tablet   Oral   Take 22.5 mg by mouth once a week. Caution:Chemotherapy. Protect from light.  mondays         . Omega-3 Fatty Acids (FISH OIL) 1000 MG CAPS   Oral   Take 1 capsule by mouth daily.         Marland Kitchen omeprazole (PRILOSEC) 20 MG capsule   Oral   Take 20 mg by mouth every other day.          Marland Kitchen OVER THE COUNTER MEDICATION   Oral   Take 1 tablet by mouth every other day. Iron         . oxyCODONE-acetaminophen (PERCOCET) 7.5-325 MG per tablet   Oral   Take 1 tablet by mouth every 4 (four) hours as needed for pain.         . predniSONE (DELTASONE) 5 MG tablet   Oral   Take 1 tablet (5 mg total) by mouth daily.   15 tablet   0   . rosuvastatin (CRESTOR) 20 MG tablet   Oral   Take 10 mg by mouth every 3 (three) days.          Marland Kitchen sertraline (ZOLOFT) 50 MG tablet   Oral   Take 50 mg by mouth daily.           BP 158/76  Pulse 92  Temp(Src) 98.5 F (36.9 C) (Oral)  Resp 18  SpO2 92% Physical Exam  Nursing note and vitals reviewed. Constitutional: She appears well-developed and well-nourished. No distress.  Awake, alert, nontoxic appearance  HENT:  Head: Atraumatic.  Eyes: Conjunctivae are normal. Right eye exhibits no discharge. Left eye exhibits no discharge.  Neck: Neck supple.  No nuchal rigidity.  No carotid bruit.   Cardiovascular: Normal rate, regular rhythm and intact distal pulses.  Exam reveals no gallop and no  friction rub.   No murmur heard. Pulmonary/Chest: Effort normal. No respiratory distress. She exhibits no tenderness.  Abdominal: Soft. There is no  tenderness. There is no rebound.  Musculoskeletal: She exhibits no tenderness.  ROM appears intact, no obvious focal weakness  Bilateral lower extremities without palpable cords, erythema, edema, negative Homans sign.  Neurological:  Mental status and motor strength appears intact  Skin: No rash noted.  Psychiatric: She has a normal mood and affect.    ED Course  Procedures (including critical care time)  11:44 AM Pt here for evaluation of high BP.  Her BP has been normal here in ER.  She has no red flags sxs concerning for stroke, MI, PE, or other emergent medical condition.  I'm unsure if her BP cuff may give inaccurate reading.  Nevertheless, she is stable currently. Doubt hypertensive emergency or urgency.  Doubt end organ damage.  Have been evaluate for this recently with negative work up including brain MRI.    12:16 PM Care discussed with my attending who agrees that pt is stable for discharge.  Pt will f/u with PCP for further management.  She will also bring her BP cuff on her next visit to ensure it's accuracy.  Return precaution discussed.    Labs Review Labs Reviewed - No data to display Imaging Review No results found.  MDM   1. Temporary high blood pressure    BP 121/56  Pulse 80  Temp(Src) 98.7 F (37.1 C) (Oral)  Resp 16  SpO2 93%     Fayrene Helper, PA-C 09/19/12 1250

## 2012-09-19 NOTE — ED Notes (Signed)
Pt reports waking up sweating this am 0300. Has been getting episodes like this x 1 week and reports being seen here multiple times for it. Feels like bp is high, states her head feels funny and sbp was close to 200 this am. Thinks this is related to pain patch or injections she had done on Thursday. No acute distress noted at this time, bp 158/76.

## 2012-09-19 NOTE — ED Notes (Signed)
Pt reports awoke from sleep "sweaty, head felt funny", c/o h/a. States BP high when checked it. States symptoms resolved after taking "a nerve pill & changing her fentanyl patch".  Family stated, "I think it was a panic attack". Has been seen in ED for same multiple times.

## 2012-09-24 DIAGNOSIS — M810 Age-related osteoporosis without current pathological fracture: Secondary | ICD-10-CM | POA: Diagnosis not present

## 2012-09-24 DIAGNOSIS — R0609 Other forms of dyspnea: Secondary | ICD-10-CM | POA: Diagnosis not present

## 2012-09-24 DIAGNOSIS — Z6835 Body mass index (BMI) 35.0-35.9, adult: Secondary | ICD-10-CM | POA: Diagnosis not present

## 2012-09-24 DIAGNOSIS — I10 Essential (primary) hypertension: Secondary | ICD-10-CM | POA: Diagnosis not present

## 2012-09-28 DIAGNOSIS — H6120 Impacted cerumen, unspecified ear: Secondary | ICD-10-CM | POA: Diagnosis not present

## 2012-09-28 DIAGNOSIS — I10 Essential (primary) hypertension: Secondary | ICD-10-CM | POA: Diagnosis not present

## 2012-09-28 DIAGNOSIS — R0609 Other forms of dyspnea: Secondary | ICD-10-CM | POA: Diagnosis not present

## 2012-10-06 DIAGNOSIS — M47817 Spondylosis without myelopathy or radiculopathy, lumbosacral region: Secondary | ICD-10-CM | POA: Diagnosis not present

## 2012-10-15 DIAGNOSIS — M069 Rheumatoid arthritis, unspecified: Secondary | ICD-10-CM | POA: Diagnosis not present

## 2012-10-15 DIAGNOSIS — Z79899 Other long term (current) drug therapy: Secondary | ICD-10-CM | POA: Diagnosis not present

## 2012-10-15 DIAGNOSIS — G894 Chronic pain syndrome: Secondary | ICD-10-CM | POA: Diagnosis not present

## 2012-10-15 DIAGNOSIS — M5137 Other intervertebral disc degeneration, lumbosacral region: Secondary | ICD-10-CM | POA: Diagnosis not present

## 2012-10-16 DIAGNOSIS — Z23 Encounter for immunization: Secondary | ICD-10-CM | POA: Diagnosis not present

## 2012-10-19 DIAGNOSIS — I10 Essential (primary) hypertension: Secondary | ICD-10-CM | POA: Diagnosis not present

## 2012-10-19 DIAGNOSIS — Z6833 Body mass index (BMI) 33.0-33.9, adult: Secondary | ICD-10-CM | POA: Diagnosis not present

## 2012-10-19 DIAGNOSIS — J189 Pneumonia, unspecified organism: Secondary | ICD-10-CM | POA: Diagnosis not present

## 2012-10-19 DIAGNOSIS — R0609 Other forms of dyspnea: Secondary | ICD-10-CM | POA: Diagnosis not present

## 2012-10-19 DIAGNOSIS — R609 Edema, unspecified: Secondary | ICD-10-CM | POA: Diagnosis not present

## 2012-10-20 DIAGNOSIS — M545 Low back pain: Secondary | ICD-10-CM | POA: Diagnosis not present

## 2012-10-20 DIAGNOSIS — M069 Rheumatoid arthritis, unspecified: Secondary | ICD-10-CM | POA: Diagnosis not present

## 2012-10-20 DIAGNOSIS — Z79899 Other long term (current) drug therapy: Secondary | ICD-10-CM | POA: Diagnosis not present

## 2012-10-20 DIAGNOSIS — M2550 Pain in unspecified joint: Secondary | ICD-10-CM | POA: Diagnosis not present

## 2012-10-29 DIAGNOSIS — Z6833 Body mass index (BMI) 33.0-33.9, adult: Secondary | ICD-10-CM | POA: Diagnosis not present

## 2012-10-29 DIAGNOSIS — J189 Pneumonia, unspecified organism: Secondary | ICD-10-CM | POA: Diagnosis not present

## 2012-10-29 DIAGNOSIS — I10 Essential (primary) hypertension: Secondary | ICD-10-CM | POA: Diagnosis not present

## 2012-10-29 DIAGNOSIS — R609 Edema, unspecified: Secondary | ICD-10-CM | POA: Diagnosis not present

## 2012-10-29 DIAGNOSIS — R0609 Other forms of dyspnea: Secondary | ICD-10-CM | POA: Diagnosis not present

## 2012-10-29 DIAGNOSIS — M069 Rheumatoid arthritis, unspecified: Secondary | ICD-10-CM | POA: Diagnosis not present

## 2012-11-12 DIAGNOSIS — G894 Chronic pain syndrome: Secondary | ICD-10-CM | POA: Diagnosis not present

## 2012-11-12 DIAGNOSIS — M4120 Other idiopathic scoliosis, site unspecified: Secondary | ICD-10-CM | POA: Diagnosis not present

## 2012-11-12 DIAGNOSIS — Z79899 Other long term (current) drug therapy: Secondary | ICD-10-CM | POA: Diagnosis not present

## 2012-11-12 DIAGNOSIS — M069 Rheumatoid arthritis, unspecified: Secondary | ICD-10-CM | POA: Diagnosis not present

## 2012-11-17 ENCOUNTER — Ambulatory Visit (INDEPENDENT_AMBULATORY_CARE_PROVIDER_SITE_OTHER): Payer: Medicare Other

## 2012-11-17 VITALS — BP 144/62 | HR 80 | Resp 12 | Ht 62.0 in | Wt 170.0 lb

## 2012-11-17 DIAGNOSIS — B351 Tinea unguium: Secondary | ICD-10-CM | POA: Diagnosis not present

## 2012-11-17 DIAGNOSIS — M2010 Hallux valgus (acquired), unspecified foot: Secondary | ICD-10-CM

## 2012-11-17 DIAGNOSIS — M79609 Pain in unspecified limb: Secondary | ICD-10-CM

## 2012-11-17 DIAGNOSIS — Q828 Other specified congenital malformations of skin: Secondary | ICD-10-CM

## 2012-11-17 DIAGNOSIS — M2040 Other hammer toe(s) (acquired), unspecified foot: Secondary | ICD-10-CM

## 2012-11-17 DIAGNOSIS — M069 Rheumatoid arthritis, unspecified: Secondary | ICD-10-CM | POA: Diagnosis not present

## 2012-11-17 NOTE — Patient Instructions (Signed)

## 2012-11-17 NOTE — Progress Notes (Signed)
  Subjective:    Patient ID: Jocelyn Bauer, female    DOB: [birth year], 79 y.o.   MRN: [id number]  HPI Comments: '' TOENAILS AND CALLUS TRIM''     Review of Systems  HENT: Positive for ear pain.   Neurological: Positive for dizziness.  All other systems reviewed and are negative.       Objective:   Physical Exam Objective findings as follows vascular status is palpable DP postal for PT one over 4 bilateral skin temperature warm to cool turgor diminished no edema rubor pallor mild varicosities noted neurologically epicritic and proprioceptive sensations intact and symmetric. Multiple keratoses are noted bilateral sub-5 right and sub-second fifth left secondary digital contractures in arthropathy with rheumatoid arthritis patient also is thick and deformed brittle crumbly criptotic nails consistent with onychomycosis the brittle dystrophic nails first right hallux second and third left showing discoloration and brittleness and tenderness. Remainder the exam is relatively unremarkable except for the rigid digital contractures is with arthropathy. Nails painful tender symptomatic as her multiple keratotic lesions and deformities.       Assessment & Plan:  Onychomycosis painful mycotic nails debrided x4 the presence of rheumatoid arthropathy deformity of multiple keratotic lesions for lesions are 3 lesions sub-5 right and sub-want to and 5 left are debrided no open wounds or ulcerations are identified at this time suggest follow palliative care in 3 months for an as-needed basis in the future.  Alvan Dame DPM

## 2012-11-20 DIAGNOSIS — M542 Cervicalgia: Secondary | ICD-10-CM | POA: Diagnosis not present

## 2012-11-20 DIAGNOSIS — Z6833 Body mass index (BMI) 33.0-33.9, adult: Secondary | ICD-10-CM | POA: Diagnosis not present

## 2012-11-20 DIAGNOSIS — M069 Rheumatoid arthritis, unspecified: Secondary | ICD-10-CM | POA: Diagnosis not present

## 2012-11-20 DIAGNOSIS — I10 Essential (primary) hypertension: Secondary | ICD-10-CM | POA: Diagnosis not present

## 2012-12-01 DIAGNOSIS — E039 Hypothyroidism, unspecified: Secondary | ICD-10-CM | POA: Diagnosis not present

## 2012-12-01 DIAGNOSIS — Z6833 Body mass index (BMI) 33.0-33.9, adult: Secondary | ICD-10-CM | POA: Diagnosis not present

## 2012-12-01 DIAGNOSIS — M069 Rheumatoid arthritis, unspecified: Secondary | ICD-10-CM | POA: Diagnosis not present

## 2012-12-01 DIAGNOSIS — M542 Cervicalgia: Secondary | ICD-10-CM | POA: Diagnosis not present

## 2012-12-01 DIAGNOSIS — M810 Age-related osteoporosis without current pathological fracture: Secondary | ICD-10-CM | POA: Diagnosis not present

## 2012-12-01 DIAGNOSIS — I10 Essential (primary) hypertension: Secondary | ICD-10-CM | POA: Diagnosis not present

## 2012-12-01 DIAGNOSIS — F410 Panic disorder [episodic paroxysmal anxiety] without agoraphobia: Secondary | ICD-10-CM | POA: Diagnosis not present

## 2012-12-09 DIAGNOSIS — IMO0001 Reserved for inherently not codable concepts without codable children: Secondary | ICD-10-CM | POA: Diagnosis not present

## 2012-12-09 DIAGNOSIS — M5137 Other intervertebral disc degeneration, lumbosacral region: Secondary | ICD-10-CM | POA: Diagnosis not present

## 2012-12-09 DIAGNOSIS — G894 Chronic pain syndrome: Secondary | ICD-10-CM | POA: Diagnosis not present

## 2012-12-09 DIAGNOSIS — Z79899 Other long term (current) drug therapy: Secondary | ICD-10-CM | POA: Diagnosis not present

## 2012-12-15 DIAGNOSIS — M47812 Spondylosis without myelopathy or radiculopathy, cervical region: Secondary | ICD-10-CM | POA: Diagnosis not present

## 2013-01-21 DIAGNOSIS — M069 Rheumatoid arthritis, unspecified: Secondary | ICD-10-CM | POA: Diagnosis not present

## 2013-01-21 DIAGNOSIS — Z79899 Other long term (current) drug therapy: Secondary | ICD-10-CM | POA: Diagnosis not present

## 2013-01-21 DIAGNOSIS — G894 Chronic pain syndrome: Secondary | ICD-10-CM | POA: Diagnosis not present

## 2013-01-21 DIAGNOSIS — M5137 Other intervertebral disc degeneration, lumbosacral region: Secondary | ICD-10-CM | POA: Diagnosis not present

## 2013-01-21 DIAGNOSIS — M2550 Pain in unspecified joint: Secondary | ICD-10-CM | POA: Diagnosis not present

## 2013-01-21 DIAGNOSIS — M545 Low back pain, unspecified: Secondary | ICD-10-CM | POA: Diagnosis not present

## 2013-01-21 DIAGNOSIS — M159 Polyosteoarthritis, unspecified: Secondary | ICD-10-CM | POA: Diagnosis not present

## 2013-01-21 DIAGNOSIS — M5030 Other cervical disc degeneration, unspecified cervical region: Secondary | ICD-10-CM | POA: Diagnosis not present

## 2013-02-25 DIAGNOSIS — M5137 Other intervertebral disc degeneration, lumbosacral region: Secondary | ICD-10-CM | POA: Diagnosis not present

## 2013-02-25 DIAGNOSIS — IMO0001 Reserved for inherently not codable concepts without codable children: Secondary | ICD-10-CM | POA: Diagnosis not present

## 2013-02-25 DIAGNOSIS — Z79899 Other long term (current) drug therapy: Secondary | ICD-10-CM | POA: Diagnosis not present

## 2013-02-25 DIAGNOSIS — M47812 Spondylosis without myelopathy or radiculopathy, cervical region: Secondary | ICD-10-CM | POA: Diagnosis not present

## 2013-02-25 DIAGNOSIS — G894 Chronic pain syndrome: Secondary | ICD-10-CM | POA: Diagnosis not present

## 2013-03-18 DIAGNOSIS — M542 Cervicalgia: Secondary | ICD-10-CM | POA: Diagnosis not present

## 2013-03-18 DIAGNOSIS — Z1331 Encounter for screening for depression: Secondary | ICD-10-CM | POA: Diagnosis not present

## 2013-03-18 DIAGNOSIS — L989 Disorder of the skin and subcutaneous tissue, unspecified: Secondary | ICD-10-CM | POA: Diagnosis not present

## 2013-03-18 DIAGNOSIS — E039 Hypothyroidism, unspecified: Secondary | ICD-10-CM | POA: Diagnosis not present

## 2013-03-18 DIAGNOSIS — Z6834 Body mass index (BMI) 34.0-34.9, adult: Secondary | ICD-10-CM | POA: Diagnosis not present

## 2013-03-18 DIAGNOSIS — I10 Essential (primary) hypertension: Secondary | ICD-10-CM | POA: Diagnosis not present

## 2013-03-18 DIAGNOSIS — M069 Rheumatoid arthritis, unspecified: Secondary | ICD-10-CM | POA: Diagnosis not present

## 2013-04-01 DIAGNOSIS — M47812 Spondylosis without myelopathy or radiculopathy, cervical region: Secondary | ICD-10-CM | POA: Diagnosis not present

## 2013-04-06 DIAGNOSIS — L821 Other seborrheic keratosis: Secondary | ICD-10-CM | POA: Diagnosis not present

## 2013-04-06 DIAGNOSIS — D1801 Hemangioma of skin and subcutaneous tissue: Secondary | ICD-10-CM | POA: Diagnosis not present

## 2013-04-20 DIAGNOSIS — M069 Rheumatoid arthritis, unspecified: Secondary | ICD-10-CM | POA: Diagnosis not present

## 2013-04-20 DIAGNOSIS — M2550 Pain in unspecified joint: Secondary | ICD-10-CM | POA: Diagnosis not present

## 2013-04-20 DIAGNOSIS — Z79899 Other long term (current) drug therapy: Secondary | ICD-10-CM | POA: Diagnosis not present

## 2013-04-20 DIAGNOSIS — M545 Low back pain, unspecified: Secondary | ICD-10-CM | POA: Diagnosis not present

## 2013-04-20 DIAGNOSIS — M5137 Other intervertebral disc degeneration, lumbosacral region: Secondary | ICD-10-CM | POA: Diagnosis not present

## 2013-04-20 DIAGNOSIS — M159 Polyosteoarthritis, unspecified: Secondary | ICD-10-CM | POA: Diagnosis not present

## 2013-04-20 DIAGNOSIS — G894 Chronic pain syndrome: Secondary | ICD-10-CM | POA: Diagnosis not present

## 2013-04-20 DIAGNOSIS — M461 Sacroiliitis, not elsewhere classified: Secondary | ICD-10-CM | POA: Diagnosis not present

## 2013-05-14 DIAGNOSIS — M25529 Pain in unspecified elbow: Secondary | ICD-10-CM | POA: Diagnosis not present

## 2013-05-14 DIAGNOSIS — S42453A Displaced fracture of lateral condyle of unspecified humerus, initial encounter for closed fracture: Secondary | ICD-10-CM | POA: Diagnosis not present

## 2013-05-18 DIAGNOSIS — G894 Chronic pain syndrome: Secondary | ICD-10-CM | POA: Diagnosis not present

## 2013-05-18 DIAGNOSIS — M5137 Other intervertebral disc degeneration, lumbosacral region: Secondary | ICD-10-CM | POA: Diagnosis not present

## 2013-05-18 DIAGNOSIS — M461 Sacroiliitis, not elsewhere classified: Secondary | ICD-10-CM | POA: Diagnosis not present

## 2013-05-18 DIAGNOSIS — Z79899 Other long term (current) drug therapy: Secondary | ICD-10-CM | POA: Diagnosis not present

## 2013-05-24 DIAGNOSIS — S42309S Unspecified fracture of shaft of humerus, unspecified arm, sequela: Secondary | ICD-10-CM | POA: Diagnosis not present

## 2013-05-24 DIAGNOSIS — S42493A Other displaced fracture of lower end of unspecified humerus, initial encounter for closed fracture: Secondary | ICD-10-CM | POA: Diagnosis not present

## 2013-06-07 DIAGNOSIS — S42309D Unspecified fracture of shaft of humerus, unspecified arm, subsequent encounter for fracture with routine healing: Secondary | ICD-10-CM | POA: Diagnosis not present

## 2013-06-15 DIAGNOSIS — IMO0002 Reserved for concepts with insufficient information to code with codable children: Secondary | ICD-10-CM | POA: Diagnosis not present

## 2013-06-15 DIAGNOSIS — M5137 Other intervertebral disc degeneration, lumbosacral region: Secondary | ICD-10-CM | POA: Diagnosis not present

## 2013-06-15 DIAGNOSIS — G894 Chronic pain syndrome: Secondary | ICD-10-CM | POA: Diagnosis not present

## 2013-06-15 DIAGNOSIS — M1710 Unilateral primary osteoarthritis, unspecified knee: Secondary | ICD-10-CM | POA: Diagnosis not present

## 2013-06-15 DIAGNOSIS — M19019 Primary osteoarthritis, unspecified shoulder: Secondary | ICD-10-CM | POA: Diagnosis not present

## 2013-06-17 DIAGNOSIS — E785 Hyperlipidemia, unspecified: Secondary | ICD-10-CM | POA: Diagnosis not present

## 2013-06-17 DIAGNOSIS — I10 Essential (primary) hypertension: Secondary | ICD-10-CM | POA: Diagnosis not present

## 2013-06-17 DIAGNOSIS — E039 Hypothyroidism, unspecified: Secondary | ICD-10-CM | POA: Diagnosis not present

## 2013-06-17 DIAGNOSIS — E559 Vitamin D deficiency, unspecified: Secondary | ICD-10-CM | POA: Diagnosis not present

## 2013-06-22 ENCOUNTER — Other Ambulatory Visit: Payer: Self-pay | Admitting: Internal Medicine

## 2013-06-22 DIAGNOSIS — M069 Rheumatoid arthritis, unspecified: Secondary | ICD-10-CM | POA: Diagnosis not present

## 2013-06-22 DIAGNOSIS — D649 Anemia, unspecified: Secondary | ICD-10-CM | POA: Diagnosis not present

## 2013-06-22 DIAGNOSIS — E039 Hypothyroidism, unspecified: Secondary | ICD-10-CM | POA: Diagnosis not present

## 2013-06-22 DIAGNOSIS — M542 Cervicalgia: Secondary | ICD-10-CM | POA: Diagnosis not present

## 2013-06-22 DIAGNOSIS — R911 Solitary pulmonary nodule: Secondary | ICD-10-CM

## 2013-06-22 DIAGNOSIS — J984 Other disorders of lung: Secondary | ICD-10-CM | POA: Diagnosis not present

## 2013-06-22 DIAGNOSIS — Z0000 Encounter for general adult medical examination without abnormal findings: Secondary | ICD-10-CM | POA: Diagnosis not present

## 2013-06-22 DIAGNOSIS — I10 Essential (primary) hypertension: Secondary | ICD-10-CM | POA: Diagnosis not present

## 2013-06-22 DIAGNOSIS — E785 Hyperlipidemia, unspecified: Secondary | ICD-10-CM | POA: Diagnosis not present

## 2013-06-24 DIAGNOSIS — S42309D Unspecified fracture of shaft of humerus, unspecified arm, subsequent encounter for fracture with routine healing: Secondary | ICD-10-CM | POA: Diagnosis not present

## 2013-06-24 DIAGNOSIS — M25529 Pain in unspecified elbow: Secondary | ICD-10-CM | POA: Diagnosis not present

## 2013-07-13 DIAGNOSIS — G894 Chronic pain syndrome: Secondary | ICD-10-CM | POA: Diagnosis not present

## 2013-07-13 DIAGNOSIS — Z79899 Other long term (current) drug therapy: Secondary | ICD-10-CM | POA: Diagnosis not present

## 2013-07-13 DIAGNOSIS — M1610 Unilateral primary osteoarthritis, unspecified hip: Secondary | ICD-10-CM | POA: Diagnosis not present

## 2013-07-13 DIAGNOSIS — M169 Osteoarthritis of hip, unspecified: Secondary | ICD-10-CM | POA: Diagnosis not present

## 2013-07-13 DIAGNOSIS — IMO0001 Reserved for inherently not codable concepts without codable children: Secondary | ICD-10-CM | POA: Diagnosis not present

## 2013-07-14 DIAGNOSIS — S42309D Unspecified fracture of shaft of humerus, unspecified arm, subsequent encounter for fracture with routine healing: Secondary | ICD-10-CM | POA: Diagnosis not present

## 2013-07-22 DIAGNOSIS — M810 Age-related osteoporosis without current pathological fracture: Secondary | ICD-10-CM | POA: Diagnosis not present

## 2013-07-22 DIAGNOSIS — M545 Low back pain, unspecified: Secondary | ICD-10-CM | POA: Diagnosis not present

## 2013-07-22 DIAGNOSIS — M2550 Pain in unspecified joint: Secondary | ICD-10-CM | POA: Diagnosis not present

## 2013-07-22 DIAGNOSIS — M069 Rheumatoid arthritis, unspecified: Secondary | ICD-10-CM | POA: Diagnosis not present

## 2013-08-10 DIAGNOSIS — M1990 Unspecified osteoarthritis, unspecified site: Secondary | ICD-10-CM | POA: Diagnosis not present

## 2013-08-10 DIAGNOSIS — Z79899 Other long term (current) drug therapy: Secondary | ICD-10-CM | POA: Diagnosis not present

## 2013-08-10 DIAGNOSIS — G894 Chronic pain syndrome: Secondary | ICD-10-CM | POA: Diagnosis not present

## 2013-08-10 DIAGNOSIS — M810 Age-related osteoporosis without current pathological fracture: Secondary | ICD-10-CM | POA: Diagnosis not present

## 2013-08-10 DIAGNOSIS — M5137 Other intervertebral disc degeneration, lumbosacral region: Secondary | ICD-10-CM | POA: Diagnosis not present

## 2013-08-19 DIAGNOSIS — M25529 Pain in unspecified elbow: Secondary | ICD-10-CM | POA: Diagnosis not present

## 2013-08-19 DIAGNOSIS — S42309D Unspecified fracture of shaft of humerus, unspecified arm, subsequent encounter for fracture with routine healing: Secondary | ICD-10-CM | POA: Diagnosis not present

## 2013-08-25 ENCOUNTER — Other Ambulatory Visit: Payer: Medicare Other

## 2013-09-07 DIAGNOSIS — G894 Chronic pain syndrome: Secondary | ICD-10-CM | POA: Diagnosis not present

## 2013-09-07 DIAGNOSIS — Z79899 Other long term (current) drug therapy: Secondary | ICD-10-CM | POA: Diagnosis not present

## 2013-09-07 DIAGNOSIS — M5137 Other intervertebral disc degeneration, lumbosacral region: Secondary | ICD-10-CM | POA: Diagnosis not present

## 2013-09-07 DIAGNOSIS — M1610 Unilateral primary osteoarthritis, unspecified hip: Secondary | ICD-10-CM | POA: Diagnosis not present

## 2013-09-07 DIAGNOSIS — M169 Osteoarthritis of hip, unspecified: Secondary | ICD-10-CM | POA: Diagnosis not present

## 2013-09-16 ENCOUNTER — Ambulatory Visit
Admission: RE | Admit: 2013-09-16 | Discharge: 2013-09-16 | Disposition: A | Discharge status: Home / Self Care | Payer: Medicare Other | Source: Ambulatory Visit | Attending: Internal Medicine | Admitting: Internal Medicine

## 2013-09-16 DIAGNOSIS — R911 Solitary pulmonary nodule: Secondary | ICD-10-CM | POA: Diagnosis not present

## 2013-09-16 MED ORDER — IOHEXOL 300 MG/ML  SOLN
75.0000 mL | Freq: Once | INTRAMUSCULAR | Status: AC | PRN
  Administered 2013-09-16: 75 mL via INTRAVENOUS

## 2013-09-17 ENCOUNTER — Other Ambulatory Visit: Payer: Self-pay | Admitting: Internal Medicine

## 2013-09-17 ENCOUNTER — Other Ambulatory Visit: Payer: Self-pay | Admitting: Endocrinology

## 2013-09-17 DIAGNOSIS — R911 Solitary pulmonary nodule: Secondary | ICD-10-CM

## 2013-09-22 DIAGNOSIS — M5137 Other intervertebral disc degeneration, lumbosacral region: Secondary | ICD-10-CM | POA: Diagnosis not present

## 2013-09-30 DIAGNOSIS — M25529 Pain in unspecified elbow: Secondary | ICD-10-CM | POA: Diagnosis not present

## 2013-09-30 DIAGNOSIS — S42309D Unspecified fracture of shaft of humerus, unspecified arm, subsequent encounter for fracture with routine healing: Secondary | ICD-10-CM | POA: Diagnosis not present

## 2013-10-05 DIAGNOSIS — G894 Chronic pain syndrome: Secondary | ICD-10-CM | POA: Diagnosis not present

## 2013-10-05 DIAGNOSIS — Z79899 Other long term (current) drug therapy: Secondary | ICD-10-CM | POA: Diagnosis not present

## 2013-10-05 DIAGNOSIS — M5380 Other specified dorsopathies, site unspecified: Secondary | ICD-10-CM | POA: Diagnosis not present

## 2013-10-05 DIAGNOSIS — M5137 Other intervertebral disc degeneration, lumbosacral region: Secondary | ICD-10-CM | POA: Diagnosis not present

## 2013-10-14 DIAGNOSIS — Z23 Encounter for immunization: Secondary | ICD-10-CM | POA: Diagnosis not present

## 2013-10-19 DIAGNOSIS — M25562 Pain in left knee: Secondary | ICD-10-CM | POA: Diagnosis not present

## 2013-10-19 DIAGNOSIS — M810 Age-related osteoporosis without current pathological fracture: Secondary | ICD-10-CM | POA: Diagnosis not present

## 2013-10-19 DIAGNOSIS — M2550 Pain in unspecified joint: Secondary | ICD-10-CM | POA: Diagnosis not present

## 2013-10-19 DIAGNOSIS — M545 Low back pain: Secondary | ICD-10-CM | POA: Diagnosis not present

## 2013-10-19 DIAGNOSIS — M0589 Other rheumatoid arthritis with rheumatoid factor of multiple sites: Secondary | ICD-10-CM | POA: Diagnosis not present

## 2013-10-26 DIAGNOSIS — Z6833 Body mass index (BMI) 33.0-33.9, adult: Secondary | ICD-10-CM | POA: Diagnosis not present

## 2013-10-26 DIAGNOSIS — E785 Hyperlipidemia, unspecified: Secondary | ICD-10-CM | POA: Diagnosis not present

## 2013-10-26 DIAGNOSIS — M542 Cervicalgia: Secondary | ICD-10-CM | POA: Diagnosis not present

## 2013-10-26 DIAGNOSIS — I10 Essential (primary) hypertension: Secondary | ICD-10-CM | POA: Diagnosis not present

## 2013-10-26 DIAGNOSIS — R918 Other nonspecific abnormal finding of lung field: Secondary | ICD-10-CM | POA: Diagnosis not present

## 2013-10-26 DIAGNOSIS — M069 Rheumatoid arthritis, unspecified: Secondary | ICD-10-CM | POA: Diagnosis not present

## 2013-10-26 DIAGNOSIS — R05 Cough: Secondary | ICD-10-CM | POA: Diagnosis not present

## 2013-10-26 DIAGNOSIS — E039 Hypothyroidism, unspecified: Secondary | ICD-10-CM | POA: Diagnosis not present

## 2013-11-02 DIAGNOSIS — M25519 Pain in unspecified shoulder: Secondary | ICD-10-CM | POA: Diagnosis not present

## 2013-11-02 DIAGNOSIS — G894 Chronic pain syndrome: Secondary | ICD-10-CM | POA: Diagnosis not present

## 2013-11-02 DIAGNOSIS — M5137 Other intervertebral disc degeneration, lumbosacral region: Secondary | ICD-10-CM | POA: Diagnosis not present

## 2013-11-02 DIAGNOSIS — M5408 Panniculitis affecting regions of neck and back, sacral and sacrococcygeal region: Secondary | ICD-10-CM | POA: Diagnosis not present

## 2013-11-02 DIAGNOSIS — M47817 Spondylosis without myelopathy or radiculopathy, lumbosacral region: Secondary | ICD-10-CM | POA: Diagnosis not present

## 2013-11-02 DIAGNOSIS — M5382 Other specified dorsopathies, cervical region: Secondary | ICD-10-CM | POA: Diagnosis not present

## 2013-11-02 DIAGNOSIS — M5030 Other cervical disc degeneration, unspecified cervical region: Secondary | ICD-10-CM | POA: Diagnosis not present

## 2013-11-02 DIAGNOSIS — M1990 Unspecified osteoarthritis, unspecified site: Secondary | ICD-10-CM | POA: Diagnosis not present

## 2013-11-03 DIAGNOSIS — M5137 Other intervertebral disc degeneration, lumbosacral region: Secondary | ICD-10-CM | POA: Diagnosis not present

## 2013-11-30 DIAGNOSIS — M79601 Pain in right arm: Secondary | ICD-10-CM | POA: Diagnosis not present

## 2013-11-30 DIAGNOSIS — M1990 Unspecified osteoarthritis, unspecified site: Secondary | ICD-10-CM | POA: Diagnosis not present

## 2013-11-30 DIAGNOSIS — M79602 Pain in left arm: Secondary | ICD-10-CM | POA: Diagnosis not present

## 2013-11-30 DIAGNOSIS — M5030 Other cervical disc degeneration, unspecified cervical region: Secondary | ICD-10-CM | POA: Diagnosis not present

## 2013-11-30 DIAGNOSIS — Z79891 Long term (current) use of opiate analgesic: Secondary | ICD-10-CM | POA: Diagnosis not present

## 2013-11-30 DIAGNOSIS — Z79899 Other long term (current) drug therapy: Secondary | ICD-10-CM | POA: Diagnosis not present

## 2013-11-30 DIAGNOSIS — G5792 Unspecified mononeuropathy of left lower limb: Secondary | ICD-10-CM | POA: Diagnosis not present

## 2013-11-30 DIAGNOSIS — G5791 Unspecified mononeuropathy of right lower limb: Secondary | ICD-10-CM | POA: Diagnosis not present

## 2013-11-30 DIAGNOSIS — G894 Chronic pain syndrome: Secondary | ICD-10-CM | POA: Diagnosis not present

## 2013-11-30 DIAGNOSIS — M069 Rheumatoid arthritis, unspecified: Secondary | ICD-10-CM | POA: Diagnosis not present

## 2013-12-28 DIAGNOSIS — M5030 Other cervical disc degeneration, unspecified cervical region: Secondary | ICD-10-CM | POA: Diagnosis not present

## 2013-12-28 DIAGNOSIS — M545 Low back pain: Secondary | ICD-10-CM | POA: Diagnosis not present

## 2013-12-28 DIAGNOSIS — Z79899 Other long term (current) drug therapy: Secondary | ICD-10-CM | POA: Diagnosis not present

## 2013-12-28 DIAGNOSIS — G894 Chronic pain syndrome: Secondary | ICD-10-CM | POA: Diagnosis not present

## 2013-12-28 DIAGNOSIS — Z79891 Long term (current) use of opiate analgesic: Secondary | ICD-10-CM | POA: Diagnosis not present

## 2014-01-20 DIAGNOSIS — Z79899 Other long term (current) drug therapy: Secondary | ICD-10-CM | POA: Diagnosis not present

## 2014-01-20 DIAGNOSIS — M0589 Other rheumatoid arthritis with rheumatoid factor of multiple sites: Secondary | ICD-10-CM | POA: Diagnosis not present

## 2014-01-20 DIAGNOSIS — M0540 Rheumatoid myopathy with rheumatoid arthritis of unspecified site: Secondary | ICD-10-CM | POA: Diagnosis not present

## 2014-01-20 DIAGNOSIS — M810 Age-related osteoporosis without current pathological fracture: Secondary | ICD-10-CM | POA: Diagnosis not present

## 2014-01-20 DIAGNOSIS — Z9229 Personal history of other drug therapy: Secondary | ICD-10-CM | POA: Diagnosis not present

## 2014-01-20 DIAGNOSIS — M2550 Pain in unspecified joint: Secondary | ICD-10-CM | POA: Diagnosis not present

## 2014-01-20 DIAGNOSIS — M545 Low back pain: Secondary | ICD-10-CM | POA: Diagnosis not present

## 2014-01-25 DIAGNOSIS — G894 Chronic pain syndrome: Secondary | ICD-10-CM | POA: Diagnosis not present

## 2014-01-25 DIAGNOSIS — M069 Rheumatoid arthritis, unspecified: Secondary | ICD-10-CM | POA: Diagnosis not present

## 2014-01-25 DIAGNOSIS — Z79899 Other long term (current) drug therapy: Secondary | ICD-10-CM | POA: Diagnosis not present

## 2014-01-25 DIAGNOSIS — M542 Cervicalgia: Secondary | ICD-10-CM | POA: Diagnosis not present

## 2014-02-22 DIAGNOSIS — M79606 Pain in leg, unspecified: Secondary | ICD-10-CM | POA: Diagnosis not present

## 2014-02-22 DIAGNOSIS — G894 Chronic pain syndrome: Secondary | ICD-10-CM | POA: Diagnosis not present

## 2014-02-22 DIAGNOSIS — Z79899 Other long term (current) drug therapy: Secondary | ICD-10-CM | POA: Diagnosis not present

## 2014-02-22 DIAGNOSIS — M069 Rheumatoid arthritis, unspecified: Secondary | ICD-10-CM | POA: Diagnosis not present

## 2014-02-22 DIAGNOSIS — M542 Cervicalgia: Secondary | ICD-10-CM | POA: Diagnosis not present

## 2014-02-22 DIAGNOSIS — M545 Low back pain: Secondary | ICD-10-CM | POA: Diagnosis not present

## 2014-02-23 DIAGNOSIS — Z6832 Body mass index (BMI) 32.0-32.9, adult: Secondary | ICD-10-CM | POA: Diagnosis not present

## 2014-02-23 DIAGNOSIS — M069 Rheumatoid arthritis, unspecified: Secondary | ICD-10-CM | POA: Diagnosis not present

## 2014-02-23 DIAGNOSIS — E039 Hypothyroidism, unspecified: Secondary | ICD-10-CM | POA: Diagnosis not present

## 2014-02-23 DIAGNOSIS — Z1389 Encounter for screening for other disorder: Secondary | ICD-10-CM | POA: Diagnosis not present

## 2014-02-23 DIAGNOSIS — J309 Allergic rhinitis, unspecified: Secondary | ICD-10-CM | POA: Diagnosis not present

## 2014-02-23 DIAGNOSIS — I10 Essential (primary) hypertension: Secondary | ICD-10-CM | POA: Diagnosis not present

## 2014-02-25 DIAGNOSIS — H16143 Punctate keratitis, bilateral: Secondary | ICD-10-CM | POA: Diagnosis not present

## 2014-02-28 ENCOUNTER — Emergency Department (HOSPITAL_BASED_OUTPATIENT_CLINIC_OR_DEPARTMENT_OTHER)
Admission: EM | Admit: 2014-02-28 | Discharge: 2014-03-01 | Disposition: A | Discharge status: Home / Self Care | Payer: Medicare Other | Attending: Emergency Medicine | Admitting: Emergency Medicine

## 2014-02-28 ENCOUNTER — Emergency Department (HOSPITAL_BASED_OUTPATIENT_CLINIC_OR_DEPARTMENT_OTHER): Payer: Medicare Other

## 2014-02-28 ENCOUNTER — Encounter (HOSPITAL_BASED_OUTPATIENT_CLINIC_OR_DEPARTMENT_OTHER): Payer: Self-pay | Admitting: *Deleted

## 2014-02-28 DIAGNOSIS — Z87891 Personal history of nicotine dependence: Secondary | ICD-10-CM | POA: Diagnosis not present

## 2014-02-28 DIAGNOSIS — Z7983 Long term (current) use of bisphosphonates: Secondary | ICD-10-CM | POA: Diagnosis not present

## 2014-02-28 DIAGNOSIS — Z7952 Long term (current) use of systemic steroids: Secondary | ICD-10-CM | POA: Diagnosis not present

## 2014-02-28 DIAGNOSIS — Z7982 Long term (current) use of aspirin: Secondary | ICD-10-CM | POA: Insufficient documentation

## 2014-02-28 DIAGNOSIS — Z7951 Long term (current) use of inhaled steroids: Secondary | ICD-10-CM | POA: Insufficient documentation

## 2014-02-28 DIAGNOSIS — Z8659 Personal history of other mental and behavioral disorders: Secondary | ICD-10-CM | POA: Insufficient documentation

## 2014-02-28 DIAGNOSIS — M1990 Unspecified osteoarthritis, unspecified site: Secondary | ICD-10-CM | POA: Insufficient documentation

## 2014-02-28 DIAGNOSIS — E785 Hyperlipidemia, unspecified: Secondary | ICD-10-CM | POA: Diagnosis not present

## 2014-02-28 DIAGNOSIS — N390 Urinary tract infection, site not specified: Secondary | ICD-10-CM | POA: Insufficient documentation

## 2014-02-28 DIAGNOSIS — R410 Disorientation, unspecified: Secondary | ICD-10-CM | POA: Diagnosis not present

## 2014-02-28 DIAGNOSIS — R609 Edema, unspecified: Secondary | ICD-10-CM | POA: Insufficient documentation

## 2014-02-28 DIAGNOSIS — K219 Gastro-esophageal reflux disease without esophagitis: Secondary | ICD-10-CM | POA: Diagnosis not present

## 2014-02-28 DIAGNOSIS — I10 Essential (primary) hypertension: Secondary | ICD-10-CM | POA: Diagnosis not present

## 2014-02-28 DIAGNOSIS — B9689 Other specified bacterial agents as the cause of diseases classified elsewhere: Secondary | ICD-10-CM | POA: Diagnosis not present

## 2014-02-28 DIAGNOSIS — R4182 Altered mental status, unspecified: Secondary | ICD-10-CM | POA: Diagnosis present

## 2014-02-28 DIAGNOSIS — Z79899 Other long term (current) drug therapy: Secondary | ICD-10-CM | POA: Diagnosis not present

## 2014-02-28 DIAGNOSIS — G4751 Confusional arousals: Secondary | ICD-10-CM | POA: Diagnosis not present

## 2014-02-28 LAB — CBC WITH DIFFERENTIAL/PLATELET
BASOS ABS: 0.1 10*3/uL (ref 0.0–0.1)
Basophils Relative: 1 % (ref 0–1)
EOS PCT: 7 % — AB (ref 0–5)
Eosinophils Absolute: 0.4 10*3/uL (ref 0.0–0.7)
HCT: 40.4 % (ref 36.0–46.0)
HEMOGLOBIN: 12.9 g/dL (ref 12.0–15.0)
LYMPHS ABS: 1.3 10*3/uL (ref 0.7–4.0)
LYMPHS PCT: 22 % (ref 12–46)
MCH: 29.9 pg (ref 26.0–34.0)
MCHC: 31.9 g/dL (ref 30.0–36.0)
MCV: 93.7 fL (ref 78.0–100.0)
MONOS PCT: 13 % — AB (ref 3–12)
Monocytes Absolute: 0.8 10*3/uL (ref 0.1–1.0)
NEUTROS ABS: 3.5 10*3/uL (ref 1.7–7.7)
NEUTROS PCT: 57 % (ref 43–77)
Platelets: 326 10*3/uL (ref 150–400)
RBC: 4.31 MIL/uL (ref 3.87–5.11)
RDW: 15.9 % — ABNORMAL HIGH (ref 11.5–15.5)
WBC: 6.1 10*3/uL (ref 4.0–10.5)

## 2014-02-28 LAB — COMPREHENSIVE METABOLIC PANEL
ALT: 25 U/L (ref 0–35)
AST: 31 U/L (ref 0–37)
Albumin: 4.4 g/dL (ref 3.5–5.2)
Alkaline Phosphatase: 90 U/L (ref 39–117)
Anion gap: 6 (ref 5–15)
BUN: 17 mg/dL (ref 6–23)
CHLORIDE: 97 mmol/L (ref 96–112)
CO2: 29 mmol/L (ref 19–32)
CREATININE: 1.06 mg/dL (ref 0.50–1.10)
Calcium: 8.8 mg/dL (ref 8.4–10.5)
GFR, EST AFRICAN AMERICAN: 55 mL/min — AB (ref >90)
GFR, EST NON AFRICAN AMERICAN: 47 mL/min — AB (ref >90)
GLUCOSE: 126 mg/dL — AB (ref 70–99)
Potassium: 4.1 mmol/L (ref 3.5–5.1)
SODIUM: 132 mmol/L — AB (ref 135–145)
TOTAL PROTEIN: 7.7 g/dL (ref 6.0–8.3)
Total Bilirubin: 0.3 mg/dL (ref 0.3–1.2)

## 2014-02-28 LAB — URINE MICROSCOPIC-ADD ON

## 2014-02-28 LAB — URINALYSIS, ROUTINE W REFLEX MICROSCOPIC
Bilirubin Urine: NEGATIVE
Glucose, UA: NEGATIVE mg/dL
Hgb urine dipstick: NEGATIVE
Ketones, ur: NEGATIVE mg/dL
NITRITE: NEGATIVE
PH: 6.5 (ref 5.0–8.0)
PROTEIN: NEGATIVE mg/dL
Specific Gravity, Urine: 1.005 (ref 1.005–1.030)
Urobilinogen, UA: 0.2 mg/dL (ref 0.0–1.0)

## 2014-02-28 LAB — I-STAT CG4 LACTIC ACID, ED: Lactic Acid, Venous: 1.79 mmol/L (ref 0.5–2.0)

## 2014-02-28 MED ORDER — DEXTROSE 5 % IV SOLN
1.0000 g | Freq: Once | INTRAVENOUS | Status: AC
  Administered 2014-02-28: 1 g via INTRAVENOUS

## 2014-02-28 MED ORDER — CEPHALEXIN 500 MG PO CAPS: 500.0000 mg | ORAL_CAPSULE | Freq: Two times a day (BID) | ORAL | Status: DC

## 2014-02-28 MED ORDER — CEFTRIAXONE SODIUM 1 G IJ SOLR
INTRAMUSCULAR | Status: AC
Start: 2014-02-28 — End: 2014-02-28
  Filled 2014-02-28: qty 10

## 2014-02-28 NOTE — ED Provider Notes (Addendum)
CSN: [id number]     Arrival date & time 02/28/14  1940 History  This chart was scribed for Jocelyn Johns, MD by [name], ED Scribe. The patient was seen in [facility]07/[facility] and the patient's care was started at 9:44 PM.  Chief Complaint  Patient presents with  . Altered Mental Status   Patient is a 81 y.o. female presenting with altered mental status. The history is provided by the patient and a relative. No language interpreter was used.  Altered Mental Status Presenting symptoms: confusion   Associated symptoms: no abdominal pain, no fever, no headaches, no nausea, no rash, no vomiting and no weakness     HPI Comments: Jocelyn Bauer is a 81 y.o. female who presents to the Emergency Department complaining of increased confusion for the past three days. Pt was going to take 14 pills today of her anxiety medication. She also put her telephone on the stove and put empty pots on the stove with the burners on. She also was looking for medication in the fridge. This is not the first incident of confusion, her daughter states around christmas she was exhibiting similar behavior which they attributed at that time to her being out of her BP medication. Pt lives with her daughter. She denies nausea, fever, vomiting, diarrhea, cough, congestion, imbalance, new pain, dysuria, bladder infections, recent falls, difficulty with speech, abdominal pain, SOB, CP.  She did recently increase her oxycodone to 15mg  this past Tuesday.  Past Medical History  Diagnosis Date  . Hypertension   . Shortness of breath     occas  . Arthritis     RA  . Hypothyroidism   . Hyperlipidemia   . GERD (gastroesophageal reflux disease)   . Anxiety     HX OF PANIC ATTACKS   Past Surgical History  Procedure Laterality Date  . Joint replacement  1998    left hip replacement and bilateral elbow replacements  about 15 yrs ago  . Thyroidectomy    . Total hip revision  03/11/2011    Procedure: TOTAL HIP REVISION;  Surgeon:  [name], MD;  Location: [facility];  Service: Orthopedics;  Laterality: Left;  . Total hip arthroplasty     History reviewed. No pertinent family history. History  Substance Use Topics  . Smoking status: Former Research scientist (life sciences)  . Smokeless tobacco: Never Used     Comment: QUIT SMOKING IN THE 70"S  . Alcohol Use: No   OB History    No data available     Review of Systems  Constitutional: Negative for fever, chills, diaphoresis and fatigue.  HENT: Negative for congestion, rhinorrhea and sneezing.   Eyes: Negative.   Respiratory: Negative for cough, chest tightness and shortness of breath.   Cardiovascular: Negative for chest pain and leg swelling.  Gastrointestinal: Negative for nausea, vomiting, abdominal pain, diarrhea and blood in stool.  Genitourinary: Negative for dysuria, frequency, hematuria, flank pain and difficulty urinating.  Musculoskeletal: Negative for back pain and arthralgias.  Skin: Negative for rash.  Neurological: Negative for dizziness, speech difficulty, weakness, numbness and headaches.  Psychiatric/Behavioral: Positive for confusion.   Allergies  Contrast media  Home Medications   Prior to Admission medications   Medication Sig Start Date End Date Taking? Authorizing Provider  oxycodone (OXY-IR) 5 MG capsule Take 15 mg by mouth every 4 (four) hours as needed.   Yes Historical Provider, MD  alendronate (FOSAMAX) 70 MG tablet Take 70 mg by mouth every 7 (seven) days. Take with  a full glass of water on an empty stomach. Takes on Saturday    Historical Provider, MD  amLODipine (NORVASC) 5 MG tablet Take 5 mg by mouth every morning.    Historical Provider, MD  aspirin 325 MG tablet Take 325 mg by mouth daily.    Historical Provider, MD  benazepril (LOTENSIN) 20 MG tablet Take 20 mg by mouth every morning.    Historical Provider, MD  cephALEXin (KEFLEX) 500 MG capsule Take 1 capsule (500 mg total) by mouth 2 (two) times daily. 02/28/14   Jocelyn Johns, MD   cholecalciferol (VITAMIN D) 1000 UNITS tablet Take 2,000 Units by mouth daily.    Historical Provider, MD  fentaNYL (DURAGESIC - DOSED MCG/HR) 25 MCG/HR Place 1 patch onto the skin every 3 (three) days.    Historical Provider, MD  fexofenadine (ALLEGRA) 180 MG tablet Take 180 mg by mouth daily as needed (For allergies.).     Historical Provider, MD  fluticasone (FLONASE) 50 MCG/ACT nasal spray Place 2 sprays into the nose daily.     Historical Provider, MD  folic acid (FOLVITE) 1 MG tablet Take 1 mg by mouth daily.    Historical Provider, MD  furosemide (LASIX) 20 MG tablet Take 20 mg by mouth every other day as needed for fluid (For ankle swelling).     Historical Provider, MD  levothyroxine (SYNTHROID, LEVOTHROID) 100 MCG tablet Take 100 mcg by mouth daily before breakfast.    Historical Provider, MD  LORazepam (ATIVAN) 0.5 MG tablet Take 0.25-5 mg by mouth 2 (two) times daily as needed for anxiety.    Historical Provider, MD  methotrexate (RHEUMATREX) 2.5 MG tablet Take 22.5 mg by mouth once a week. Caution:Chemotherapy. Protect from light.  mondays    Historical Provider, MD  Omega-3 Fatty Acids (FISH OIL) 1000 MG CAPS Take 1 capsule by mouth daily.    Historical Provider, MD  omeprazole (PRILOSEC) 20 MG capsule Take 20 mg by mouth every other day.     Historical Provider, MD  OVER THE COUNTER MEDICATION Take 1 tablet by mouth every other day. Iron    Historical Provider, MD  oxyCODONE-acetaminophen (PERCOCET) 7.5-325 MG per tablet Take 1 tablet by mouth every 4 (four) hours as needed for pain.    Historical Provider, MD  predniSONE (DELTASONE) 5 MG tablet Take 1 tablet (5 mg total) by mouth daily. 02/15/12   [name], NP  rosuvastatin (CRESTOR) 20 MG tablet Take 10 mg by mouth every 3 (three) days.     Historical Provider, MD  sertraline (ZOLOFT) 50 MG tablet Take 50 mg by mouth daily.     Historical Provider, MD   BP 132/57 mmHg  Pulse 78  Temp(Src) 97.5 F (36.4 C) (Oral)  Resp 18   SpO2 94% Physical Exam  Constitutional: She is oriented to person, place, and time. She appears well-developed and well-nourished.  HENT:  Head: Normocephalic and atraumatic.  Eyes: Pupils are equal, round, and reactive to light.  Neck: Normal range of motion. Neck supple.  Cardiovascular: Normal rate, regular rhythm and normal heart sounds.   Pulmonary/Chest: Effort normal and breath sounds normal. No respiratory distress. She has no wheezes. She has no rales. She exhibits no tenderness.  Abdominal: Soft. Bowel sounds are normal. There is no tenderness. There is no rebound and no guarding.  Musculoskeletal: Normal range of motion. She exhibits edema (at baseline per pt).  Joint deformity to hands from RA  Lymphadenopathy:    She has no cervical adenopathy.  Neurological: She is alert and oriented to person, place, and time. She has normal strength. No cranial nerve deficit or sensory deficit.  FTN intact  Skin: Skin is warm and dry. No rash noted.  Psychiatric: She has a normal mood and affect.    ED Course  Procedures  DIAGNOSTIC STUDIES: Oxygen Saturation is 94% on room air, adequate by my interpretation.    COORDINATION OF CARE: 9:50 PM Discussed treatment plan with pt at bedside and pt agreed to plan.  Labs Review Labs Reviewed  URINALYSIS, ROUTINE W REFLEX MICROSCOPIC - Abnormal; Notable for the following:    APPearance CLOUDY (*)    Leukocytes, UA MODERATE (*)    All other components within normal limits  URINE MICROSCOPIC-ADD ON - Abnormal; Notable for the following:    Squamous Epithelial / LPF FEW (*)    Bacteria, UA FEW (*)    All other components within normal limits  COMPREHENSIVE METABOLIC PANEL - Abnormal; Notable for the following:    Sodium 132 (*)    Glucose, Bld 126 (*)    GFR calc non Af Amer 47 (*)    GFR calc Af Amer 55 (*)    All other components within normal limits  CBC WITH DIFFERENTIAL/PLATELET - Abnormal; Notable for the following:    RDW  15.9 (*)    Monocytes Relative 13 (*)    Eosinophils Relative 7 (*)    All other components within normal limits  URINE CULTURE  I-STAT CG4 LACTIC ACID, ED   Imaging Review Dg Chest 2 View  02/28/2014   CLINICAL DATA:  Increased confusion for 3 days. Altered mental status. History of hypertension.  EXAM: CHEST  2 VIEW  COMPARISON:  CT chest 09/16/2013.  Chest 06/28/2012.  FINDINGS: Normal heart size and pulmonary vascularity. Mild hyperinflation. No evidence of focal consolidation or airspace disease in the lungs. No blunting of costophrenic angles. No pneumothorax. Tortuous aorta. Degenerative changes in the spine.  IMPRESSION: No active cardiopulmonary disease.   Electronically Signed   By: Lucienne Capers M.D.   On: 02/28/2014 23:01   Ct Head Wo Contrast  02/28/2014   CLINICAL DATA:  Increased confusion for 3 days. Altered mental status.  EXAM: CT HEAD WITHOUT CONTRAST  TECHNIQUE: Contiguous axial images were obtained from the base of the skull through the vertex without intravenous contrast.  COMPARISON:  09/07/2012  FINDINGS: Diffuse cerebral atrophy. Low-attenuation changes in the deep white matter consistent with small vessel ischemia. No ventricular dilatation. No mass effect or midline shift. No abnormal extra-axial fluid collections. Gray-white matter junctions are distinct. Basal cisterns are not effaced. No evidence of acute intracranial hemorrhage. No depressed skull fractures. Visualized paranasal sinuses and mastoid air cells are not opacified.  IMPRESSION: No acute intracranial abnormalities. Chronic atrophy and small vessel ischemic changes.   Electronically Signed   By: Lucienne Capers M.D.   On: 02/28/2014 23:05     EKG Interpretation   Date/Time:  Monday February 28 2014 21:58:51 EST Ventricular Rate:  86 PR Interval:  150 QRS Duration: 82 QT Interval:  354 QTC Calculation: 423 R Axis:   -8 Text Interpretation:  Normal sinus rhythm Cannot rule out Anterior infarct  ,  age undetermined Abnormal ECG since last tracing no significant change  Confirmed by Finnlee Silvernail  MD, Lucah Petta ([id number]) on 03/01/2014 12:03:47 AM      MDM   Final diagnoses:  UTI (lower urinary tract infection)  Confusion   Pt with some new confusion.  No other  neuro deficits.  No ataxia.  Has evidence of UTI, will tx with rocephin/keflex, culture sent.  Could also be medication related.  Advised to go back down on her pain medication for a few days to see if this helps with her confusion.  No other signs of infection.  No significant lab abnormality.  Pt stays with her daughter 24/7.  Will d/c with her daughter.  To f/u with PCP.  Return precautions given.  I personally performed the services described in this documentation, which was scribed in my presence.  The recorded information has been reviewed and considered.      Jocelyn Johns, MD 02/28/14 [place], MD 03/01/14 [id number]

## 2014-02-28 NOTE — ED Notes (Addendum)
Daughter states  increased confusion x 3 days.   Pain meds increased tuesday

## 2014-02-28 NOTE — Discharge Instructions (Signed)

## 2014-03-02 LAB — URINE CULTURE
CULTURE: NO GROWTH
Colony Count: NO GROWTH
Special Requests: NORMAL

## 2014-03-08 DIAGNOSIS — H02051 Trichiasis without entropian right upper eyelid: Secondary | ICD-10-CM | POA: Diagnosis not present

## 2014-03-22 ENCOUNTER — Other Ambulatory Visit: Payer: Self-pay | Admitting: Internal Medicine

## 2014-03-22 DIAGNOSIS — Z79899 Other long term (current) drug therapy: Secondary | ICD-10-CM | POA: Diagnosis not present

## 2014-03-22 DIAGNOSIS — G894 Chronic pain syndrome: Secondary | ICD-10-CM | POA: Diagnosis not present

## 2014-03-22 DIAGNOSIS — R911 Solitary pulmonary nodule: Secondary | ICD-10-CM

## 2014-03-22 DIAGNOSIS — M542 Cervicalgia: Secondary | ICD-10-CM | POA: Diagnosis not present

## 2014-03-22 DIAGNOSIS — M549 Dorsalgia, unspecified: Secondary | ICD-10-CM | POA: Diagnosis not present

## 2014-03-23 ENCOUNTER — Inpatient Hospital Stay: Admission: RE | Admit: 2014-03-23 | Payer: Medicare Other | Source: Ambulatory Visit

## 2014-03-29 DIAGNOSIS — H04123 Dry eye syndrome of bilateral lacrimal glands: Secondary | ICD-10-CM | POA: Diagnosis not present

## 2014-03-31 ENCOUNTER — Ambulatory Visit
Admission: RE | Admit: 2014-03-31 | Discharge: 2014-03-31 | Disposition: A | Discharge status: Home / Self Care | Payer: Medicare Other | Source: Ambulatory Visit | Attending: Internal Medicine | Admitting: Internal Medicine

## 2014-03-31 DIAGNOSIS — R918 Other nonspecific abnormal finding of lung field: Secondary | ICD-10-CM | POA: Diagnosis not present

## 2014-03-31 DIAGNOSIS — R911 Solitary pulmonary nodule: Secondary | ICD-10-CM

## 2014-04-19 DIAGNOSIS — M542 Cervicalgia: Secondary | ICD-10-CM | POA: Diagnosis not present

## 2014-04-19 DIAGNOSIS — M545 Low back pain: Secondary | ICD-10-CM | POA: Diagnosis not present

## 2014-04-19 DIAGNOSIS — Z79899 Other long term (current) drug therapy: Secondary | ICD-10-CM | POA: Diagnosis not present

## 2014-04-19 DIAGNOSIS — M069 Rheumatoid arthritis, unspecified: Secondary | ICD-10-CM | POA: Diagnosis not present

## 2014-04-19 DIAGNOSIS — M0589 Other rheumatoid arthritis with rheumatoid factor of multiple sites: Secondary | ICD-10-CM | POA: Diagnosis not present

## 2014-04-19 DIAGNOSIS — M549 Dorsalgia, unspecified: Secondary | ICD-10-CM | POA: Diagnosis not present

## 2014-04-19 DIAGNOSIS — M2550 Pain in unspecified joint: Secondary | ICD-10-CM | POA: Diagnosis not present

## 2014-05-17 DIAGNOSIS — M069 Rheumatoid arthritis, unspecified: Secondary | ICD-10-CM | POA: Diagnosis not present

## 2014-05-17 DIAGNOSIS — M545 Low back pain: Secondary | ICD-10-CM | POA: Diagnosis not present

## 2014-05-17 DIAGNOSIS — M79606 Pain in leg, unspecified: Secondary | ICD-10-CM | POA: Diagnosis not present

## 2014-05-17 DIAGNOSIS — Z79899 Other long term (current) drug therapy: Secondary | ICD-10-CM | POA: Diagnosis not present

## 2014-05-17 DIAGNOSIS — M542 Cervicalgia: Secondary | ICD-10-CM | POA: Diagnosis not present

## 2014-05-17 DIAGNOSIS — G894 Chronic pain syndrome: Secondary | ICD-10-CM | POA: Diagnosis not present

## 2014-06-14 DIAGNOSIS — M47816 Spondylosis without myelopathy or radiculopathy, lumbar region: Secondary | ICD-10-CM | POA: Diagnosis not present

## 2014-06-14 DIAGNOSIS — Z79899 Other long term (current) drug therapy: Secondary | ICD-10-CM | POA: Diagnosis not present

## 2014-06-14 DIAGNOSIS — M1990 Unspecified osteoarthritis, unspecified site: Secondary | ICD-10-CM | POA: Diagnosis not present

## 2014-06-14 DIAGNOSIS — M797 Fibromyalgia: Secondary | ICD-10-CM | POA: Diagnosis not present

## 2014-06-14 DIAGNOSIS — G629 Polyneuropathy, unspecified: Secondary | ICD-10-CM | POA: Diagnosis not present

## 2014-06-14 DIAGNOSIS — G894 Chronic pain syndrome: Secondary | ICD-10-CM | POA: Diagnosis not present

## 2014-06-27 DIAGNOSIS — E039 Hypothyroidism, unspecified: Secondary | ICD-10-CM | POA: Diagnosis not present

## 2014-06-27 DIAGNOSIS — E559 Vitamin D deficiency, unspecified: Secondary | ICD-10-CM | POA: Diagnosis not present

## 2014-06-27 DIAGNOSIS — I10 Essential (primary) hypertension: Secondary | ICD-10-CM | POA: Diagnosis not present

## 2014-07-07 DIAGNOSIS — I10 Essential (primary) hypertension: Secondary | ICD-10-CM | POA: Diagnosis not present

## 2014-07-07 DIAGNOSIS — D649 Anemia, unspecified: Secondary | ICD-10-CM | POA: Diagnosis not present

## 2014-07-07 DIAGNOSIS — R8299 Other abnormal findings in urine: Secondary | ICD-10-CM | POA: Diagnosis not present

## 2014-07-07 DIAGNOSIS — Z1389 Encounter for screening for other disorder: Secondary | ICD-10-CM | POA: Diagnosis not present

## 2014-07-07 DIAGNOSIS — M542 Cervicalgia: Secondary | ICD-10-CM | POA: Diagnosis not present

## 2014-07-07 DIAGNOSIS — F410 Panic disorder [episodic paroxysmal anxiety] without agoraphobia: Secondary | ICD-10-CM | POA: Diagnosis not present

## 2014-07-07 DIAGNOSIS — Z6832 Body mass index (BMI) 32.0-32.9, adult: Secondary | ICD-10-CM | POA: Diagnosis not present

## 2014-07-07 DIAGNOSIS — M069 Rheumatoid arthritis, unspecified: Secondary | ICD-10-CM | POA: Diagnosis not present

## 2014-07-07 DIAGNOSIS — Z0000 Encounter for general adult medical examination without abnormal findings: Secondary | ICD-10-CM | POA: Diagnosis not present

## 2014-07-07 DIAGNOSIS — N390 Urinary tract infection, site not specified: Secondary | ICD-10-CM | POA: Diagnosis not present

## 2014-07-07 DIAGNOSIS — R600 Localized edema: Secondary | ICD-10-CM | POA: Diagnosis not present

## 2014-07-07 DIAGNOSIS — N8320 Unspecified ovarian cysts: Secondary | ICD-10-CM | POA: Diagnosis not present

## 2014-07-07 DIAGNOSIS — R918 Other nonspecific abnormal finding of lung field: Secondary | ICD-10-CM | POA: Diagnosis not present

## 2014-07-07 DIAGNOSIS — Z9189 Other specified personal risk factors, not elsewhere classified: Secondary | ICD-10-CM | POA: Diagnosis not present

## 2014-07-07 DIAGNOSIS — Z1231 Encounter for screening mammogram for malignant neoplasm of breast: Secondary | ICD-10-CM | POA: Diagnosis not present

## 2014-07-21 DIAGNOSIS — M0589 Other rheumatoid arthritis with rheumatoid factor of multiple sites: Secondary | ICD-10-CM | POA: Diagnosis not present

## 2014-07-21 DIAGNOSIS — M2550 Pain in unspecified joint: Secondary | ICD-10-CM | POA: Diagnosis not present

## 2014-07-21 DIAGNOSIS — Z79899 Other long term (current) drug therapy: Secondary | ICD-10-CM | POA: Diagnosis not present

## 2014-07-21 DIAGNOSIS — M545 Low back pain: Secondary | ICD-10-CM | POA: Diagnosis not present

## 2014-08-09 DIAGNOSIS — Z79899 Other long term (current) drug therapy: Secondary | ICD-10-CM | POA: Diagnosis not present

## 2014-08-09 DIAGNOSIS — M47816 Spondylosis without myelopathy or radiculopathy, lumbar region: Secondary | ICD-10-CM | POA: Diagnosis not present

## 2014-08-09 DIAGNOSIS — G894 Chronic pain syndrome: Secondary | ICD-10-CM | POA: Diagnosis not present

## 2014-08-09 DIAGNOSIS — M797 Fibromyalgia: Secondary | ICD-10-CM | POA: Diagnosis not present

## 2014-08-09 DIAGNOSIS — M1990 Unspecified osteoarthritis, unspecified site: Secondary | ICD-10-CM | POA: Diagnosis not present

## 2014-08-09 DIAGNOSIS — G629 Polyneuropathy, unspecified: Secondary | ICD-10-CM | POA: Diagnosis not present

## 2014-09-06 DIAGNOSIS — M069 Rheumatoid arthritis, unspecified: Secondary | ICD-10-CM | POA: Diagnosis not present

## 2014-09-06 DIAGNOSIS — Z79899 Other long term (current) drug therapy: Secondary | ICD-10-CM | POA: Diagnosis not present

## 2014-09-06 DIAGNOSIS — M549 Dorsalgia, unspecified: Secondary | ICD-10-CM | POA: Diagnosis not present

## 2014-09-06 DIAGNOSIS — G894 Chronic pain syndrome: Secondary | ICD-10-CM | POA: Diagnosis not present

## 2014-10-04 DIAGNOSIS — Z79899 Other long term (current) drug therapy: Secondary | ICD-10-CM | POA: Diagnosis not present

## 2014-10-04 DIAGNOSIS — M069 Rheumatoid arthritis, unspecified: Secondary | ICD-10-CM | POA: Diagnosis not present

## 2014-10-04 DIAGNOSIS — G894 Chronic pain syndrome: Secondary | ICD-10-CM | POA: Diagnosis not present

## 2014-10-04 DIAGNOSIS — M47816 Spondylosis without myelopathy or radiculopathy, lumbar region: Secondary | ICD-10-CM | POA: Diagnosis not present

## 2014-10-13 DIAGNOSIS — L989 Disorder of the skin and subcutaneous tissue, unspecified: Secondary | ICD-10-CM | POA: Diagnosis not present

## 2014-10-13 DIAGNOSIS — Z6833 Body mass index (BMI) 33.0-33.9, adult: Secondary | ICD-10-CM | POA: Diagnosis not present

## 2014-10-13 DIAGNOSIS — E039 Hypothyroidism, unspecified: Secondary | ICD-10-CM | POA: Diagnosis not present

## 2014-10-13 DIAGNOSIS — M069 Rheumatoid arthritis, unspecified: Secondary | ICD-10-CM | POA: Diagnosis not present

## 2014-10-13 DIAGNOSIS — I10 Essential (primary) hypertension: Secondary | ICD-10-CM | POA: Diagnosis not present

## 2014-10-13 DIAGNOSIS — Z23 Encounter for immunization: Secondary | ICD-10-CM | POA: Diagnosis not present

## 2014-10-13 DIAGNOSIS — M810 Age-related osteoporosis without current pathological fracture: Secondary | ICD-10-CM | POA: Diagnosis not present

## 2014-10-14 ENCOUNTER — Other Ambulatory Visit (HOSPITAL_COMMUNITY): Payer: Self-pay | Admitting: Internal Medicine

## 2014-10-27 DIAGNOSIS — M2550 Pain in unspecified joint: Secondary | ICD-10-CM | POA: Diagnosis not present

## 2014-10-27 DIAGNOSIS — M0579 Rheumatoid arthritis with rheumatoid factor of multiple sites without organ or systems involvement: Secondary | ICD-10-CM | POA: Diagnosis not present

## 2014-10-27 DIAGNOSIS — Z79899 Other long term (current) drug therapy: Secondary | ICD-10-CM | POA: Diagnosis not present

## 2014-10-27 DIAGNOSIS — M810 Age-related osteoporosis without current pathological fracture: Secondary | ICD-10-CM | POA: Diagnosis not present

## 2014-10-31 ENCOUNTER — Encounter (HOSPITAL_COMMUNITY): Payer: Self-pay | Admitting: Emergency Medicine

## 2014-10-31 ENCOUNTER — Emergency Department (HOSPITAL_COMMUNITY)
Admission: EM | Admit: 2014-10-31 | Discharge: 2014-10-31 | Disposition: A | Discharge status: Home / Self Care | Payer: Medicare Other | Attending: Emergency Medicine | Admitting: Emergency Medicine

## 2014-10-31 DIAGNOSIS — Z791 Long term (current) use of non-steroidal anti-inflammatories (NSAID): Secondary | ICD-10-CM | POA: Insufficient documentation

## 2014-10-31 DIAGNOSIS — Z7951 Long term (current) use of inhaled steroids: Secondary | ICD-10-CM | POA: Diagnosis not present

## 2014-10-31 DIAGNOSIS — Z87891 Personal history of nicotine dependence: Secondary | ICD-10-CM | POA: Diagnosis not present

## 2014-10-31 DIAGNOSIS — K921 Melena: Secondary | ICD-10-CM | POA: Diagnosis not present

## 2014-10-31 DIAGNOSIS — Z79899 Other long term (current) drug therapy: Secondary | ICD-10-CM | POA: Diagnosis not present

## 2014-10-31 DIAGNOSIS — I10 Essential (primary) hypertension: Secondary | ICD-10-CM | POA: Insufficient documentation

## 2014-10-31 DIAGNOSIS — Z7982 Long term (current) use of aspirin: Secondary | ICD-10-CM | POA: Diagnosis not present

## 2014-10-31 DIAGNOSIS — E785 Hyperlipidemia, unspecified: Secondary | ICD-10-CM | POA: Insufficient documentation

## 2014-10-31 DIAGNOSIS — Z7952 Long term (current) use of systemic steroids: Secondary | ICD-10-CM | POA: Insufficient documentation

## 2014-10-31 DIAGNOSIS — K625 Hemorrhage of anus and rectum: Secondary | ICD-10-CM | POA: Diagnosis not present

## 2014-10-31 DIAGNOSIS — F419 Anxiety disorder, unspecified: Secondary | ICD-10-CM | POA: Diagnosis not present

## 2014-10-31 DIAGNOSIS — K219 Gastro-esophageal reflux disease without esophagitis: Secondary | ICD-10-CM | POA: Diagnosis not present

## 2014-10-31 DIAGNOSIS — M1990 Unspecified osteoarthritis, unspecified site: Secondary | ICD-10-CM | POA: Diagnosis not present

## 2014-10-31 DIAGNOSIS — E039 Hypothyroidism, unspecified: Secondary | ICD-10-CM | POA: Diagnosis not present

## 2014-10-31 LAB — COMPREHENSIVE METABOLIC PANEL
ALBUMIN: 3.8 g/dL (ref 3.5–5.0)
ALT: 21 U/L (ref 14–54)
ANION GAP: 11 (ref 5–15)
AST: 23 U/L (ref 15–41)
Alkaline Phosphatase: 63 U/L (ref 38–126)
BILIRUBIN TOTAL: 0.7 mg/dL (ref 0.3–1.2)
BUN: 9 mg/dL (ref 6–20)
CO2: 25 mmol/L (ref 22–32)
Calcium: 9.4 mg/dL (ref 8.9–10.3)
Chloride: 104 mmol/L (ref 101–111)
Creatinine, Ser: 0.75 mg/dL (ref 0.44–1.00)
GFR calc Af Amer: >60 mL/min (ref >60)
GLUCOSE: 121 mg/dL — AB (ref 65–99)
POTASSIUM: 3.7 mmol/L (ref 3.5–5.1)
Sodium: 140 mmol/L (ref 135–145)
TOTAL PROTEIN: 6.6 g/dL (ref 6.5–8.1)

## 2014-10-31 LAB — URINE MICROSCOPIC-ADD ON

## 2014-10-31 LAB — CBC
HEMATOCRIT: 38.4 % (ref 36.0–46.0)
Hemoglobin: 12.2 g/dL (ref 12.0–15.0)
MCH: 30.2 pg (ref 26.0–34.0)
MCHC: 31.8 g/dL (ref 30.0–36.0)
MCV: 95 fL (ref 78.0–100.0)
Platelets: 395 10*3/uL (ref 150–400)
RBC: 4.04 MIL/uL (ref 3.87–5.11)
RDW: 17.6 % — AB (ref 11.5–15.5)
WBC: 6.6 10*3/uL (ref 4.0–10.5)

## 2014-10-31 LAB — URINALYSIS, ROUTINE W REFLEX MICROSCOPIC
Bilirubin Urine: NEGATIVE
Glucose, UA: NEGATIVE mg/dL
KETONES UR: NEGATIVE mg/dL
LEUKOCYTES UA: NEGATIVE
NITRITE: NEGATIVE
PROTEIN: NEGATIVE mg/dL
Specific Gravity, Urine: 1.012 (ref 1.005–1.030)
UROBILINOGEN UA: 0.2 mg/dL (ref 0.0–1.0)
pH: 7.5 (ref 5.0–8.0)

## 2014-10-31 LAB — TYPE AND SCREEN
ABO/RH(D): A POS
Antibody Screen: NEGATIVE

## 2014-10-31 LAB — ABO/RH: ABO/RH(D): A POS

## 2014-10-31 MED ORDER — OXYCODONE-ACETAMINOPHEN 5-325 MG PO TABS
1.0000 | ORAL_TABLET | Freq: Once | ORAL | Status: AC
  Administered 2014-10-31: 1 via ORAL
  Filled 2014-10-31: qty 1

## 2014-10-31 MED ORDER — OXYCODONE HCL 5 MG PO TABS
5.0000 mg | ORAL_TABLET | Freq: Once | ORAL | Status: AC
  Administered 2014-10-31: 5 mg via ORAL
  Filled 2014-10-31: qty 1

## 2014-10-31 MED ORDER — OXYCODONE-ACETAMINOPHEN 5-325 MG PO TABS: 2.0000 | ORAL_TABLET | Freq: Once | ORAL | Status: DC

## 2014-10-31 MED ORDER — OXYCODONE-ACETAMINOPHEN 5-325 MG PO TABS: 1.0000 | ORAL_TABLET | Freq: Once | ORAL | Status: DC

## 2014-10-31 NOTE — ED Notes (Signed)
Dr. Ralene Bathe made aware of patient's headache and advised patient can get percocet like she takes at home.

## 2014-10-31 NOTE — ED Provider Notes (Signed)
CSN: [id number]     Arrival date & time 10/31/14  9169 History   First MD Initiated Contact with Patient 10/31/14 0701     Chief Complaint  Patient presents with  . Rectal Bleeding     Patient is a 81 y.o. female presenting with hematochezia. The history is provided by the patient. No language interpreter was used.  Rectal Bleeding  Ms. Cragg presents for evaluation of bright red blood per rectum. She reports 4 days of large amount of bright red blood per rectum, 1-2 episodes daily. She is trying to wait to see her family doctor today who presented to the ED for further evaluation given the increase in volume of the bleeding. She denies any abdominal pain, fevers, shortness of breath, chest pain. She was taking an aspirin daily but stopped 3 days ago due to the bleeding. She has no history of rectal bleeding previously. She reports feeling generalized weakness today. Symptoms are moderate, constant, worsening. She has been seen by Dr. Cristina Gong with gastroenterology in the past.  Past Medical History  Diagnosis Date  . Hypertension   . Shortness of breath     occas  . Arthritis     RA  . Hypothyroidism   . Hyperlipidemia   . GERD (gastroesophageal reflux disease)   . Anxiety     HX OF PANIC ATTACKS   Past Surgical History  Procedure Laterality Date  . Joint replacement  1998    left hip replacement and bilateral elbow replacements  about 15 yrs ago  . Thyroidectomy    . Total hip revision  03/11/2011    Procedure: TOTAL HIP REVISION;  Surgeon: [name], MD;  Location: [facility];  Service: Orthopedics;  Laterality: Left;  . Total hip arthroplasty     No family history on file. Social History  Substance Use Topics  . Smoking status: Former Research scientist (life sciences)  . Smokeless tobacco: Never Used     Comment: QUIT SMOKING IN THE 70"S  . Alcohol Use: No   OB History    No data available     Review of Systems  Gastrointestinal: Positive for hematochezia.  All other systems reviewed and  are negative.     Allergies  Contrast media and Tdap  Home Medications   Prior to Admission medications   Medication Sig Start Date End Date Taking? Authorizing Provider  alendronate (FOSAMAX) 70 MG tablet Take 70 mg by mouth every 7 (seven) days. Take with a full glass of water on an empty stomach. Takes on Saturday    Historical Provider, MD  amLODipine (NORVASC) 5 MG tablet Take 5 mg by mouth every morning.    Historical Provider, MD  aspirin 325 MG tablet Take 325 mg by mouth daily.    Historical Provider, MD  benazepril (LOTENSIN) 20 MG tablet Take 20 mg by mouth every morning.    Historical Provider, MD  cephALEXin (KEFLEX) 500 MG capsule Take 1 capsule (500 mg total) by mouth 2 (two) times daily. 02/28/14   [name], MD  cholecalciferol (VITAMIN D) 1000 UNITS tablet Take 2,000 Units by mouth daily.    Historical Provider, MD  fentaNYL (DURAGESIC - DOSED MCG/HR) 25 MCG/HR Place 1 patch onto the skin every 3 (three) days.    Historical Provider, MD  fexofenadine (ALLEGRA) 180 MG tablet Take 180 mg by mouth daily as needed (For allergies.).     Historical Provider, MD  fluticasone (FLONASE) 50 MCG/ACT nasal spray Place 2 sprays into the nose daily.  Historical Provider, MD  folic acid (FOLVITE) 1 MG tablet Take 1 mg by mouth daily.    Historical Provider, MD  furosemide (LASIX) 20 MG tablet Take 20 mg by mouth every other day as needed for fluid (For ankle swelling).     Historical Provider, MD  levothyroxine (SYNTHROID, LEVOTHROID) 100 MCG tablet Take 100 mcg by mouth daily before breakfast.    Historical Provider, MD  LORazepam (ATIVAN) 0.5 MG tablet Take 0.25-5 mg by mouth 2 (two) times daily as needed for anxiety.    Historical Provider, MD  methotrexate (RHEUMATREX) 2.5 MG tablet Take 22.5 mg by mouth once a week. Caution:Chemotherapy. Protect from light.  mondays    Historical Provider, MD  Omega-3 Fatty Acids (FISH OIL) 1000 MG CAPS Take 1 capsule by mouth daily.     Historical Provider, MD  omeprazole (PRILOSEC) 20 MG capsule Take 20 mg by mouth every other day.     Historical Provider, MD  OVER THE COUNTER MEDICATION Take 1 tablet by mouth every other day. Iron    Historical Provider, MD  oxycodone (OXY-IR) 5 MG capsule Take 15 mg by mouth every 4 (four) hours as needed.    Historical Provider, MD  oxyCODONE-acetaminophen (PERCOCET) 7.5-325 MG per tablet Take 1 tablet by mouth every 4 (four) hours as needed for pain.    Historical Provider, MD  predniSONE (DELTASONE) 5 MG tablet Take 1 tablet (5 mg total) by mouth daily. 02/15/12   Sheryle Hail, NP  rosuvastatin (CRESTOR) 20 MG tablet Take 10 mg by mouth every 3 (three) days.     Historical Provider, MD  sertraline (ZOLOFT) 50 MG tablet Take 50 mg by mouth daily.     Historical Provider, MD   There were no vitals taken for this visit. Physical Exam  Constitutional: She is oriented to person, place, and time. She appears well-developed and well-nourished.  HENT:  Head: Normocephalic and atraumatic.  Cardiovascular: Normal rate and regular rhythm.   No murmur heard. Pulmonary/Chest: Effort normal and breath sounds normal. No respiratory distress.  Abdominal: Soft. There is no rebound and no guarding.  Mild to moderate left lower quadrant tenderness  Genitourinary:  Small amount of bright red blood in rectal vault, no external hemorrhoids, nontender rectal exam.  Musculoskeletal: She exhibits no edema or tenderness.  Neurological: She is alert and oriented to person, place, and time.  Skin: Skin is warm and dry.  Psychiatric: She has a normal mood and affect. Her behavior is normal.  Nursing note and vitals reviewed.   ED Course  Procedures (including critical care time) Labs Review Labs Reviewed  CBC - Abnormal; Notable for the following:    RDW 17.6 (*)    All other components within normal limits  COMPREHENSIVE METABOLIC PANEL - Abnormal; Notable for the following:    Glucose, Bld 121 (*)     All other components within normal limits  URINALYSIS, ROUTINE W REFLEX MICROSCOPIC (NOT AT [facility]) - Abnormal; Notable for the following:    Hgb urine dipstick SMALL (*)    All other components within normal limits  URINE MICROSCOPIC-ADD ON  TYPE AND SCREEN  ABO/RH    Imaging Review No results found. I have personally reviewed and evaluated these images and lab results as part of my medical decision-making.   EKG Interpretation None      MDM   Final diagnoses:  Hematochezia    Patient here for evaluation of hematochezia 3-4 days. The patient complains of generalized weakness  but no other systemic symptoms. There is a small amount of gross blood on rectal exam. Hemoglobin is within normal limits at 12.2. The patient has discontinued her aspirin. Discussed the patient with equal GI the patient examination and history. She has follow-up scheduled for this Friday in the office. Discussed with patient and family home care as well as close return precautions. Discuss clear liquid diet for now, no additional aspirin until restarted by GI. Examination and presentation is not consistent with diverticulitis or intra-abdominal infection.    Quintella Reichert, MD 11/01/14 [phone]

## 2014-10-31 NOTE — Discharge Instructions (Signed)
Do not take Aspirin until seen by Gastroenterology.  Get rechecked if you have worsening bleeding or new concerning symptoms.     Gastrointestinal Bleeding Gastrointestinal (GI) bleeding means there is bleeding somewhere along the digestive tract, between the mouth and anus. CAUSES  There are many different problems that can cause GI bleeding. Possible causes include:  Esophagitis. This is inflammation, irritation, or swelling of the esophagus.  Hemorrhoids.These are veins that are full of blood (engorged) in the rectum. They cause pain, inflammation, and may bleed.  Anal fissures.These are areas of painful tearing which may bleed. They are often caused by passing hard stool.  Diverticulosis.These are pouches that form on the colon over time, with age, and may bleed significantly.  Diverticulitis.This is inflammation in areas with diverticulosis. It can cause pain, fever, and bloody stools, although bleeding is rare.  Polyps and cancer. Colon cancer often starts out as precancerous polyps.  Gastritis and ulcers.Bleeding from the upper gastrointestinal tract (near the stomach) may travel through the intestines and produce black, sometimes tarry, often bad smelling stools. In certain cases, if the bleeding is fast enough, the stools may not be black, but red. This condition may be life-threatening. SYMPTOMS   Vomiting bright red blood or material that looks like coffee grounds.  Bloody, black, or tarry stools. DIAGNOSIS  Your caregiver may diagnose your condition by taking your history and performing a physical exam. More tests may be needed, including:  X-rays and other imaging tests.  Esophagogastroduodenoscopy (EGD). This test uses a flexible, lighted tube to look at your esophagus, stomach, and small intestine.  Colonoscopy. This test uses a flexible, lighted tube to look at your colon. TREATMENT  Treatment depends on the cause of your bleeding.   For bleeding from the  esophagus, stomach, small intestine, or colon, the caregiver doing your EGD or colonoscopy may be able to stop the bleeding as part of the procedure.  Inflammation or infection of the colon can be treated with medicines.  Many rectal problems can be treated with creams, suppositories, or warm baths.  Surgery is sometimes needed.  Blood transfusions are sometimes needed if you have lost a lot of blood. If bleeding is slow, you may be allowed to go home. If there is a lot of bleeding, you will need to stay in the hospital for observation. HOME CARE INSTRUCTIONS   Take any medicines exactly as prescribed.  Keep your stools soft by eating foods that are high in fiber. These foods include whole grains, legumes, fruits, and vegetables. Prunes (1 to 3 a day) work well for many people.  Drink enough fluids to keep your urine clear or pale yellow. SEEK IMMEDIATE MEDICAL CARE IF:   Your bleeding increases.  You feel lightheaded, weak, or you faint.  You have severe cramps in your back or abdomen.  You pass large blood clots in your stool.  Your problems are getting worse. MAKE SURE YOU:   Understand these instructions.  Will watch your condition.  Will get help right away if you are not doing well or get worse.   This information is not intended to replace advice given to you by your health care provider. Make sure you discuss any questions you have with your health care provider.   Document Released: 12/22/1999 Document Revised: 12/11/2011 Document Reviewed: 06/13/2014 Elsevier Interactive Patient Education 2016 [place] Liquid Diet A clear liquid diet is a short-term diet that is prescribed to provide the necessary fluid and basic energy you  need when you can have nothing else. The clear liquid diet consists of liquids or solids that will become liquid at room temperature. You should be able to see through the liquid. There are many reasons that you may be restricted to  clear liquids, such as:  When you have a sudden-onset (acute) condition that occurs before or after surgery.  To help your body slowly get adjusted to food again after a long period when you were unable to have food.  Replacement of fluids when you have a diarrheal disease.  When you are going to have certain exams, such as a colonoscopy, in which instruments are inserted inside your body to look at parts of your digestive system. WHAT CAN I HAVE? A clear liquid diet does not provide all the nutrients you need. It is important to choose a variety of the following items to get as many nutrients as possible:  Vegetable juices that do not have pulp.  Fruit juices and fruit drinks that do not have pulp.  Coffee (regular or decaffeinated), tea, or soda at the discretion of your health care provider.  Clear bouillon, broth, or strained broth-based soups.  High-protein and flavored gelatins.  Sugar or honey.  Ices or frozen ice pops that do not contain milk. If you are not sure whether you can have certain items, you should ask your health care provider. You may also ask your health care provider if there are any other clear liquid options.   This information is not intended to replace advice given to you by your health care provider. Make sure you discuss any questions you have with your health care provider.   Document Released: 12/24/2004 Document Revised: 12/29/2012 Document Reviewed: 11/20/2012 Elsevier Interactive Patient Education [employer].

## 2014-10-31 NOTE — ED Notes (Signed)
Pt arrives with rectal bleeding since Thursday, states both bright red and dark blood. States she's not on blood thinners. Denies pain, but reports weakness, and "shaky nerves"

## 2014-10-31 NOTE — ED Notes (Signed)
Dr. Ralene Bathe advised Occult blood card does not need to be sent to the lab since she can see visible blood on it.

## 2014-11-03 DIAGNOSIS — M25569 Pain in unspecified knee: Secondary | ICD-10-CM | POA: Diagnosis not present

## 2014-11-03 DIAGNOSIS — M542 Cervicalgia: Secondary | ICD-10-CM | POA: Diagnosis not present

## 2014-11-03 DIAGNOSIS — E669 Obesity, unspecified: Secondary | ICD-10-CM | POA: Diagnosis not present

## 2014-11-03 DIAGNOSIS — M5382 Other specified dorsopathies, cervical region: Secondary | ICD-10-CM | POA: Diagnosis not present

## 2014-11-03 DIAGNOSIS — R51 Headache: Secondary | ICD-10-CM | POA: Diagnosis not present

## 2014-11-03 DIAGNOSIS — Z79899 Other long term (current) drug therapy: Secondary | ICD-10-CM | POA: Diagnosis not present

## 2014-11-03 DIAGNOSIS — G894 Chronic pain syndrome: Secondary | ICD-10-CM | POA: Diagnosis not present

## 2014-11-04 DIAGNOSIS — K921 Melena: Secondary | ICD-10-CM | POA: Diagnosis not present

## 2014-11-04 DIAGNOSIS — K625 Hemorrhage of anus and rectum: Secondary | ICD-10-CM | POA: Diagnosis not present

## 2014-11-04 DIAGNOSIS — R109 Unspecified abdominal pain: Secondary | ICD-10-CM | POA: Diagnosis not present

## 2014-11-08 ENCOUNTER — Encounter (HOSPITAL_COMMUNITY): Payer: Self-pay

## 2014-11-10 ENCOUNTER — Encounter (HOSPITAL_COMMUNITY): Payer: Self-pay

## 2014-11-10 ENCOUNTER — Ambulatory Visit (HOSPITAL_COMMUNITY)
Admission: RE | Admit: 2014-11-10 | Discharge: 2014-11-10 | Disposition: A | Discharge status: Home / Self Care | Payer: Medicare Other | Source: Ambulatory Visit | Attending: Internal Medicine | Admitting: Internal Medicine

## 2014-11-10 DIAGNOSIS — M810 Age-related osteoporosis without current pathological fracture: Secondary | ICD-10-CM | POA: Diagnosis not present

## 2014-11-10 HISTORY — DX: Age-related osteoporosis without current pathological fracture: M81.0

## 2014-11-10 MED ORDER — DENOSUMAB 60 MG/ML ~~LOC~~ SOLN
60.0000 mg | Freq: Once | SUBCUTANEOUS | Status: AC
  Administered 2014-11-10: 60 mg via SUBCUTANEOUS
  Filled 2014-11-10: qty 1

## 2014-11-10 NOTE — Discharge Instructions (Signed)
Denosumab injection  What is this medicine?  DENOSUMAB (den oh sue mab) slows bone breakdown. Prolia is used to treat osteoporosis in women after menopause and in men. Xgeva is used to prevent bone fractures and other bone problems caused by cancer bone metastases. Xgeva is also used to treat giant cell tumor of the bone.  This medicine may be used for other purposes; ask your health care provider or pharmacist if you have questions.  What should I tell my health care provider before I take this medicine?  They need to know if you have any of these conditions:  -dental disease  -eczema  -infection or history of infections  -kidney disease or on dialysis  -low blood calcium or vitamin D  -malabsorption syndrome  -scheduled to have surgery or tooth extraction  -taking medicine that contains denosumab  -thyroid or parathyroid disease  -an unusual reaction to denosumab, other medicines, foods, dyes, or preservatives  -pregnant or trying to get pregnant  -breast-feeding  How should I use this medicine?  This medicine is for injection under the skin. It is given by a health care professional in a hospital or clinic setting.  If you are getting Prolia, a special MedGuide will be given to you by the pharmacist with each prescription and refill. Be sure to read this information carefully each time.  For Prolia, talk to your pediatrician regarding the use of this medicine in children. Special care may be needed. For Xgeva, talk to your pediatrician regarding the use of this medicine in children. While this drug may be prescribed for children as young as 13 years for selected conditions, precautions do apply.  Overdosage: If you think you have taken too much of this medicine contact a poison control center or emergency room at once.  NOTE: This medicine is only for you. Do not share this medicine with others.  What if I miss a dose?  It is important not to miss your dose. Call your doctor or health care professional if you are  unable to keep an appointment.  What may interact with this medicine?  Do not take this medicine with any of the following medications:  -other medicines containing denosumab  This medicine may also interact with the following medications:  -medicines that suppress the immune system  -medicines that treat cancer  -steroid medicines like prednisone or cortisone  This list may not describe all possible interactions. Give your health care provider a list of all the medicines, herbs, non-prescription drugs, or dietary supplements you use. Also tell them if you smoke, drink alcohol, or use illegal drugs. Some items may interact with your medicine.  What should I watch for while using this medicine?  Visit your doctor or health care professional for regular checks on your progress. Your doctor or health care professional may order blood tests and other tests to see how you are doing.  Call your doctor or health care professional if you get a cold or other infection while receiving this medicine. Do not treat yourself. This medicine may decrease your body's ability to fight infection.  You should make sure you get enough calcium and vitamin D while you are taking this medicine, unless your doctor tells you not to. Discuss the foods you eat and the vitamins you take with your health care professional.  See your dentist regularly. Brush and floss your teeth as directed. Before you have any dental work done, tell your dentist you are receiving this medicine.  Do   not become pregnant while taking this medicine or for 5 months after stopping it. Women should inform their doctor if they wish to become pregnant or think they might be pregnant. There is a potential for serious side effects to an unborn child. Talk to your health care professional or pharmacist for more information.  What side effects may I notice from receiving this medicine?  Side effects that you should report to your doctor or health care professional as soon as  possible:  -allergic reactions like skin rash, itching or hives, swelling of the face, lips, or tongue  -breathing problems  -chest pain  -fast, irregular heartbeat  -feeling faint or lightheaded, falls  -fever, chills, or any other sign of infection  -muscle spasms, tightening, or twitches  -numbness or tingling  -skin blisters or bumps, or is dry, peels, or red  -slow healing or unexplained pain in the mouth or jaw  -unusual bleeding or bruising  Side effects that usually do not require medical attention (Report these to your doctor or health care professional if they continue or are bothersome.):  -muscle pain  -stomach upset, gas  This list may not describe all possible side effects. Call your doctor for medical advice about side effects. You may report side effects to FDA at 1-800-FDA-1088.  Where should I keep my medicine?  This medicine is only given in a clinic, doctor's office, or other health care setting and will not be stored at home.  NOTE: This sheet is a summary. It may not cover all possible information. If you have questions about this medicine, talk to your doctor, pharmacist, or health care provider.      2016, Elsevier/Gold Standard. (2011-06-24 12:37:47)

## 2014-11-28 DIAGNOSIS — M47812 Spondylosis without myelopathy or radiculopathy, cervical region: Secondary | ICD-10-CM | POA: Diagnosis not present

## 2014-11-28 DIAGNOSIS — Z79899 Other long term (current) drug therapy: Secondary | ICD-10-CM | POA: Diagnosis not present

## 2014-11-28 DIAGNOSIS — G894 Chronic pain syndrome: Secondary | ICD-10-CM | POA: Diagnosis not present

## 2014-11-29 ENCOUNTER — Other Ambulatory Visit: Payer: Self-pay | Admitting: Gastroenterology

## 2014-11-29 DIAGNOSIS — K921 Melena: Secondary | ICD-10-CM | POA: Diagnosis not present

## 2014-11-29 DIAGNOSIS — K219 Gastro-esophageal reflux disease without esophagitis: Secondary | ICD-10-CM | POA: Diagnosis not present

## 2014-11-29 DIAGNOSIS — K5730 Diverticulosis of large intestine without perforation or abscess without bleeding: Secondary | ICD-10-CM | POA: Diagnosis not present

## 2014-11-29 DIAGNOSIS — K317 Polyp of stomach and duodenum: Secondary | ICD-10-CM | POA: Diagnosis not present

## 2014-12-27 DIAGNOSIS — M542 Cervicalgia: Secondary | ICD-10-CM | POA: Diagnosis not present

## 2014-12-27 DIAGNOSIS — G894 Chronic pain syndrome: Secondary | ICD-10-CM | POA: Diagnosis not present

## 2014-12-27 DIAGNOSIS — E669 Obesity, unspecified: Secondary | ICD-10-CM | POA: Diagnosis not present

## 2014-12-27 DIAGNOSIS — R51 Headache: Secondary | ICD-10-CM | POA: Diagnosis not present

## 2014-12-27 DIAGNOSIS — M5382 Other specified dorsopathies, cervical region: Secondary | ICD-10-CM | POA: Diagnosis not present

## 2014-12-27 DIAGNOSIS — Z79899 Other long term (current) drug therapy: Secondary | ICD-10-CM | POA: Diagnosis not present

## 2015-01-03 DIAGNOSIS — Z9981 Dependence on supplemental oxygen: Secondary | ICD-10-CM | POA: Diagnosis not present

## 2015-01-03 DIAGNOSIS — J209 Acute bronchitis, unspecified: Secondary | ICD-10-CM | POA: Diagnosis not present

## 2015-01-03 DIAGNOSIS — R918 Other nonspecific abnormal finding of lung field: Secondary | ICD-10-CM | POA: Diagnosis not present

## 2015-01-03 DIAGNOSIS — Z6833 Body mass index (BMI) 33.0-33.9, adult: Secondary | ICD-10-CM | POA: Diagnosis not present

## 2015-01-03 DIAGNOSIS — R05 Cough: Secondary | ICD-10-CM | POA: Diagnosis not present

## 2015-01-03 DIAGNOSIS — M069 Rheumatoid arthritis, unspecified: Secondary | ICD-10-CM | POA: Diagnosis not present

## 2015-01-05 ENCOUNTER — Other Ambulatory Visit (HOSPITAL_COMMUNITY): Payer: Self-pay | Admitting: Respiratory Therapy

## 2015-01-05 DIAGNOSIS — R062 Wheezing: Secondary | ICD-10-CM

## 2015-01-05 DIAGNOSIS — M069 Rheumatoid arthritis, unspecified: Secondary | ICD-10-CM

## 2015-01-12 DIAGNOSIS — Z6832 Body mass index (BMI) 32.0-32.9, adult: Secondary | ICD-10-CM | POA: Diagnosis not present

## 2015-01-12 DIAGNOSIS — R05 Cough: Secondary | ICD-10-CM | POA: Diagnosis not present

## 2015-01-12 DIAGNOSIS — J209 Acute bronchitis, unspecified: Secondary | ICD-10-CM | POA: Diagnosis not present

## 2015-01-16 ENCOUNTER — Encounter (HOSPITAL_COMMUNITY): Payer: Self-pay

## 2015-01-16 DIAGNOSIS — R062 Wheezing: Secondary | ICD-10-CM | POA: Diagnosis not present

## 2015-01-16 DIAGNOSIS — Z9981 Dependence on supplemental oxygen: Secondary | ICD-10-CM | POA: Diagnosis not present

## 2015-01-16 DIAGNOSIS — K219 Gastro-esophageal reflux disease without esophagitis: Secondary | ICD-10-CM | POA: Diagnosis not present

## 2015-01-16 DIAGNOSIS — J209 Acute bronchitis, unspecified: Secondary | ICD-10-CM | POA: Diagnosis not present

## 2015-01-19 ENCOUNTER — Encounter (HOSPITAL_COMMUNITY): Payer: Self-pay

## 2015-01-24 DIAGNOSIS — M5382 Other specified dorsopathies, cervical region: Secondary | ICD-10-CM | POA: Diagnosis not present

## 2015-01-24 DIAGNOSIS — M0579 Rheumatoid arthritis with rheumatoid factor of multiple sites without organ or systems involvement: Secondary | ICD-10-CM | POA: Diagnosis not present

## 2015-01-24 DIAGNOSIS — M810 Age-related osteoporosis without current pathological fracture: Secondary | ICD-10-CM | POA: Diagnosis not present

## 2015-01-24 DIAGNOSIS — Z79899 Other long term (current) drug therapy: Secondary | ICD-10-CM | POA: Diagnosis not present

## 2015-01-24 DIAGNOSIS — M542 Cervicalgia: Secondary | ICD-10-CM | POA: Diagnosis not present

## 2015-01-24 DIAGNOSIS — G894 Chronic pain syndrome: Secondary | ICD-10-CM | POA: Diagnosis not present

## 2015-01-24 DIAGNOSIS — E669 Obesity, unspecified: Secondary | ICD-10-CM | POA: Diagnosis not present

## 2015-01-24 DIAGNOSIS — M2550 Pain in unspecified joint: Secondary | ICD-10-CM | POA: Diagnosis not present

## 2015-01-24 DIAGNOSIS — R51 Headache: Secondary | ICD-10-CM | POA: Diagnosis not present

## 2015-02-13 DIAGNOSIS — G459 Transient cerebral ischemic attack, unspecified: Secondary | ICD-10-CM | POA: Diagnosis not present

## 2015-02-13 DIAGNOSIS — M810 Age-related osteoporosis without current pathological fracture: Secondary | ICD-10-CM | POA: Diagnosis not present

## 2015-02-13 DIAGNOSIS — I10 Essential (primary) hypertension: Secondary | ICD-10-CM | POA: Diagnosis not present

## 2015-02-13 DIAGNOSIS — M0689 Other specified rheumatoid arthritis, multiple sites: Secondary | ICD-10-CM | POA: Diagnosis not present

## 2015-02-13 DIAGNOSIS — M545 Low back pain: Secondary | ICD-10-CM | POA: Diagnosis not present

## 2015-02-13 DIAGNOSIS — Z6832 Body mass index (BMI) 32.0-32.9, adult: Secondary | ICD-10-CM | POA: Diagnosis not present

## 2015-02-13 DIAGNOSIS — N390 Urinary tract infection, site not specified: Secondary | ICD-10-CM | POA: Diagnosis not present

## 2015-02-13 DIAGNOSIS — E038 Other specified hypothyroidism: Secondary | ICD-10-CM | POA: Diagnosis not present

## 2015-02-20 DIAGNOSIS — M5382 Other specified dorsopathies, cervical region: Secondary | ICD-10-CM | POA: Diagnosis not present

## 2015-02-20 DIAGNOSIS — M25569 Pain in unspecified knee: Secondary | ICD-10-CM | POA: Diagnosis not present

## 2015-02-20 DIAGNOSIS — Z79899 Other long term (current) drug therapy: Secondary | ICD-10-CM | POA: Diagnosis not present

## 2015-02-20 DIAGNOSIS — G894 Chronic pain syndrome: Secondary | ICD-10-CM | POA: Diagnosis not present

## 2015-02-20 DIAGNOSIS — M545 Low back pain: Secondary | ICD-10-CM | POA: Diagnosis not present

## 2015-03-07 DIAGNOSIS — M47817 Spondylosis without myelopathy or radiculopathy, lumbosacral region: Secondary | ICD-10-CM | POA: Diagnosis not present

## 2015-03-21 DIAGNOSIS — M47817 Spondylosis without myelopathy or radiculopathy, lumbosacral region: Secondary | ICD-10-CM | POA: Diagnosis not present

## 2015-03-21 DIAGNOSIS — G894 Chronic pain syndrome: Secondary | ICD-10-CM | POA: Diagnosis not present

## 2015-03-21 DIAGNOSIS — Z79899 Other long term (current) drug therapy: Secondary | ICD-10-CM | POA: Diagnosis not present

## 2015-03-30 ENCOUNTER — Ambulatory Visit (HOSPITAL_COMMUNITY)
Admission: RE | Admit: 2015-03-30 | Discharge: 2015-03-30 | Disposition: A | Discharge status: Home / Self Care | Payer: Medicare Other | Source: Ambulatory Visit | Attending: Internal Medicine | Admitting: Internal Medicine

## 2015-03-30 DIAGNOSIS — R062 Wheezing: Secondary | ICD-10-CM | POA: Insufficient documentation

## 2015-03-30 DIAGNOSIS — M069 Rheumatoid arthritis, unspecified: Secondary | ICD-10-CM | POA: Diagnosis not present

## 2015-03-30 LAB — PULMONARY FUNCTION TEST
DL/VA % pred: 65 %
DL/VA: 2.97 ml/min/mmHg/L
DLCO UNC % PRED: 40 %
DLCO UNC: 8.66 ml/min/mmHg
FEF 25-75 POST: 2.17 L/s
FEF 25-75 PRE: 2.07 L/s
FEF2575-%Change-Post: 4 %
FEF2575-%PRED-PRE: 201 %
FEF2575-%Pred-Post: 210 %
FEV1-%Change-Post: 0 %
FEV1-%PRED-POST: 99 %
FEV1-%Pred-Pre: 98 %
FEV1-PRE: 1.55 L
FEV1-Post: 1.56 L
FEV1FVC-%Change-Post: 2 %
FEV1FVC-%Pred-Pre: 118 %
FEV6-%CHANGE-POST: 0 %
FEV6-%PRED-POST: 87 %
FEV6-%PRED-PRE: 88 %
FEV6-POST: 1.76 L
FEV6-PRE: 1.77 L
FEV6FVC-%PRED-POST: 107 %
FEV6FVC-%PRED-PRE: 107 %
FVC-%Change-Post: -1 %
FVC-%Pred-Post: 81 %
FVC-%Pred-Pre: 83 %
FVC-Post: 1.76 L
FVC-Pre: 1.79 L
POST FEV6/FVC RATIO: 100 %
PRE FEV1/FVC RATIO: 86 %
Post FEV1/FVC ratio: 89 %
Pre FEV6/FVC Ratio: 100 %
RV % pred: 96 %
RV: 2.3 L
TLC % PRED: 85 %
TLC: 4.08 L

## 2015-03-30 MED ORDER — ALBUTEROL SULFATE (2.5 MG/3ML) 0.083% IN NEBU
2.5000 mg | INHALATION_SOLUTION | Freq: Once | RESPIRATORY_TRACT | Status: AC
Start: 2015-03-30 — End: 2015-03-30
  Administered 2015-03-30: 2.5 mg via RESPIRATORY_TRACT

## 2015-04-18 DIAGNOSIS — Z79891 Long term (current) use of opiate analgesic: Secondary | ICD-10-CM | POA: Diagnosis not present

## 2015-04-18 DIAGNOSIS — Z79899 Other long term (current) drug therapy: Secondary | ICD-10-CM | POA: Diagnosis not present

## 2015-04-18 DIAGNOSIS — M47817 Spondylosis without myelopathy or radiculopathy, lumbosacral region: Secondary | ICD-10-CM | POA: Diagnosis not present

## 2015-04-18 DIAGNOSIS — G894 Chronic pain syndrome: Secondary | ICD-10-CM | POA: Diagnosis not present

## 2015-04-18 DIAGNOSIS — M5382 Other specified dorsopathies, cervical region: Secondary | ICD-10-CM | POA: Diagnosis not present

## 2015-04-18 DIAGNOSIS — M0579 Rheumatoid arthritis with rheumatoid factor of multiple sites without organ or systems involvement: Secondary | ICD-10-CM | POA: Diagnosis not present

## 2015-04-18 DIAGNOSIS — M2550 Pain in unspecified joint: Secondary | ICD-10-CM | POA: Diagnosis not present

## 2015-04-18 DIAGNOSIS — M810 Age-related osteoporosis without current pathological fracture: Secondary | ICD-10-CM | POA: Diagnosis not present

## 2015-04-18 DIAGNOSIS — M5137 Other intervertebral disc degeneration, lumbosacral region: Secondary | ICD-10-CM | POA: Diagnosis not present

## 2015-04-18 DIAGNOSIS — E669 Obesity, unspecified: Secondary | ICD-10-CM | POA: Diagnosis not present

## 2015-04-24 DIAGNOSIS — M25521 Pain in right elbow: Secondary | ICD-10-CM | POA: Diagnosis not present

## 2015-04-27 DIAGNOSIS — M25521 Pain in right elbow: Secondary | ICD-10-CM | POA: Diagnosis not present

## 2015-04-27 DIAGNOSIS — T84019S Broken internal joint prosthesis, unspecified site, sequela: Secondary | ICD-10-CM | POA: Diagnosis not present

## 2015-05-11 ENCOUNTER — Ambulatory Visit (HOSPITAL_COMMUNITY)
Admission: RE | Admit: 2015-05-11 | Discharge: 2015-05-11 | Disposition: A | Discharge status: Home / Self Care | Payer: Medicare Other | Source: Ambulatory Visit | Attending: Internal Medicine | Admitting: Internal Medicine

## 2015-05-11 DIAGNOSIS — T84019S Broken internal joint prosthesis, unspecified site, sequela: Secondary | ICD-10-CM | POA: Diagnosis not present

## 2015-05-11 DIAGNOSIS — M25521 Pain in right elbow: Secondary | ICD-10-CM | POA: Diagnosis not present

## 2015-05-16 DIAGNOSIS — M4696 Unspecified inflammatory spondylopathy, lumbar region: Secondary | ICD-10-CM | POA: Diagnosis not present

## 2015-05-16 DIAGNOSIS — G894 Chronic pain syndrome: Secondary | ICD-10-CM | POA: Diagnosis not present

## 2015-05-16 DIAGNOSIS — M47817 Spondylosis without myelopathy or radiculopathy, lumbosacral region: Secondary | ICD-10-CM | POA: Diagnosis not present

## 2015-05-16 DIAGNOSIS — M5137 Other intervertebral disc degeneration, lumbosacral region: Secondary | ICD-10-CM | POA: Diagnosis not present

## 2015-05-16 DIAGNOSIS — Z79891 Long term (current) use of opiate analgesic: Secondary | ICD-10-CM | POA: Diagnosis not present

## 2015-05-16 DIAGNOSIS — Z79899 Other long term (current) drug therapy: Secondary | ICD-10-CM | POA: Diagnosis not present

## 2015-05-16 DIAGNOSIS — M5382 Other specified dorsopathies, cervical region: Secondary | ICD-10-CM | POA: Diagnosis not present

## 2015-05-24 DIAGNOSIS — R0989 Other specified symptoms and signs involving the circulatory and respiratory systems: Secondary | ICD-10-CM | POA: Diagnosis not present

## 2015-05-24 DIAGNOSIS — I10 Essential (primary) hypertension: Secondary | ICD-10-CM | POA: Diagnosis not present

## 2015-05-24 DIAGNOSIS — Z01810 Encounter for preprocedural cardiovascular examination: Secondary | ICD-10-CM | POA: Diagnosis not present

## 2015-05-24 DIAGNOSIS — R0609 Other forms of dyspnea: Secondary | ICD-10-CM | POA: Diagnosis not present

## 2015-05-26 DIAGNOSIS — Z01810 Encounter for preprocedural cardiovascular examination: Secondary | ICD-10-CM | POA: Diagnosis not present

## 2015-05-26 DIAGNOSIS — R0602 Shortness of breath: Secondary | ICD-10-CM | POA: Diagnosis not present

## 2015-05-26 DIAGNOSIS — I10 Essential (primary) hypertension: Secondary | ICD-10-CM | POA: Diagnosis not present

## 2015-06-01 ENCOUNTER — Other Ambulatory Visit: Payer: Self-pay | Admitting: Orthopedic Surgery

## 2015-06-08 DIAGNOSIS — G894 Chronic pain syndrome: Secondary | ICD-10-CM | POA: Diagnosis not present

## 2015-06-08 DIAGNOSIS — Z79891 Long term (current) use of opiate analgesic: Secondary | ICD-10-CM | POA: Diagnosis not present

## 2015-06-08 DIAGNOSIS — Z79899 Other long term (current) drug therapy: Secondary | ICD-10-CM | POA: Diagnosis not present

## 2015-06-08 DIAGNOSIS — M47817 Spondylosis without myelopathy or radiculopathy, lumbosacral region: Secondary | ICD-10-CM | POA: Diagnosis not present

## 2015-06-08 DIAGNOSIS — M1288 Other specific arthropathies, not elsewhere classified, other specified site: Secondary | ICD-10-CM | POA: Diagnosis not present

## 2015-06-08 DIAGNOSIS — M4696 Unspecified inflammatory spondylopathy, lumbar region: Secondary | ICD-10-CM | POA: Diagnosis not present

## 2015-06-08 DIAGNOSIS — M5137 Other intervertebral disc degeneration, lumbosacral region: Secondary | ICD-10-CM | POA: Diagnosis not present

## 2015-06-19 ENCOUNTER — Emergency Department (HOSPITAL_COMMUNITY)
Admission: EM | Admit: 2015-06-19 | Discharge: 2015-06-19 | Disposition: A | Discharge status: Home / Self Care | Payer: Medicare Other | Attending: Emergency Medicine | Admitting: Emergency Medicine

## 2015-06-19 ENCOUNTER — Encounter (HOSPITAL_COMMUNITY): Payer: Self-pay | Admitting: *Deleted

## 2015-06-19 ENCOUNTER — Emergency Department (HOSPITAL_COMMUNITY): Payer: Medicare Other

## 2015-06-19 DIAGNOSIS — I10 Essential (primary) hypertension: Secondary | ICD-10-CM | POA: Insufficient documentation

## 2015-06-19 DIAGNOSIS — M542 Cervicalgia: Secondary | ICD-10-CM | POA: Insufficient documentation

## 2015-06-19 DIAGNOSIS — Z87891 Personal history of nicotine dependence: Secondary | ICD-10-CM | POA: Insufficient documentation

## 2015-06-19 DIAGNOSIS — Z79899 Other long term (current) drug therapy: Secondary | ICD-10-CM | POA: Insufficient documentation

## 2015-06-19 DIAGNOSIS — E039 Hypothyroidism, unspecified: Secondary | ICD-10-CM | POA: Insufficient documentation

## 2015-06-19 DIAGNOSIS — Z96643 Presence of artificial hip joint, bilateral: Secondary | ICD-10-CM | POA: Insufficient documentation

## 2015-06-19 DIAGNOSIS — E785 Hyperlipidemia, unspecified: Secondary | ICD-10-CM | POA: Diagnosis not present

## 2015-06-19 DIAGNOSIS — R51 Headache: Secondary | ICD-10-CM | POA: Insufficient documentation

## 2015-06-19 DIAGNOSIS — G8929 Other chronic pain: Secondary | ICD-10-CM | POA: Diagnosis not present

## 2015-06-19 DIAGNOSIS — R519 Headache, unspecified: Secondary | ICD-10-CM

## 2015-06-19 HISTORY — DX: Dorsalgia, unspecified: M54.2

## 2015-06-19 HISTORY — DX: Headache, unspecified: R51.9

## 2015-06-19 HISTORY — DX: Dorsalgia, unspecified: M54.9

## 2015-06-19 HISTORY — DX: Other chronic pain: G89.29

## 2015-06-19 HISTORY — DX: Headache: R51

## 2015-06-19 NOTE — ED Notes (Addendum)
PT states she is seen at a pain clinic for control of RA pain.  States her pain MD changed her pain patch last Monday and ever since she has had a headache and she has been very anxious.  She took the patch off this am and has experienced some relief.  The pain starts in her neck and radiates to back of head.

## 2015-06-19 NOTE — ED Notes (Signed)
Back from Ct scan..

## 2015-06-19 NOTE — ED Provider Notes (Signed)
CSN: [id number]     Arrival date & time 06/19/15  0747 History   First MD Initiated Contact with Patient 06/19/15 [phone]     Chief Complaint  Patient presents with  . Headache      HPI Pt was seen at 0935. Per pt and her family, c/o gradual onset and persistence of constant acute flair of her chronic neck pain and headache for the past 1 week. Pt also states she has been "very anxious." States her symptoms began after her Pain Management doctor "changed around her pain patch and anxiety medicines." Pt states she took one of her ativan with improvement of her anxiety. Pt states she "took off the pain patch," took her percocet, and "thinks the pains got some better."  Describes the headache as per her usual chronic headache pain pattern for many years.  Denies headache was sudden or maximal in onset or at any time. Denies any change in her usual chronic neck pain. Denies visual changes, no focal motor weakness, no tingling/numbness in extremities, no CP/SOB, no abd pain, no fevers, no rash.     Past Medical History  Diagnosis Date  . Hypertension   . Shortness of breath     occas  . Arthritis     RA  . Hypothyroidism   . Hyperlipidemia   . GERD (gastroesophageal reflux disease)   . Anxiety     HX OF PANIC ATTACKS  . Osteoporosis   . Headache   . Chronic pain   . Chronic neck and back pain    Past Surgical History  Procedure Laterality Date  . Joint replacement  1998    left hip replacement and bilateral elbow replacements  about 15 yrs ago  . Thyroidectomy    . Total hip revision  03/11/2011    Procedure: TOTAL HIP REVISION;  Surgeon: [name], MD;  Location: [facility];  Service: Orthopedics;  Laterality: Left;  . Total hip arthroplasty    . Elbow surgery Right     Social History  Substance Use Topics  . Smoking status: Former Research scientist (life sciences)  . Smokeless tobacco: Never Used     Comment: QUIT SMOKING IN THE 70"S  . Alcohol Use: No    Review of Systems ROS: Statement: All systems  negative except as marked or noted in the HPI; Constitutional: Negative for fever and chills. ; ; Eyes: Negative for eye pain, redness and discharge. ; ; ENMT: Negative for ear pain, hoarseness, nasal congestion, sinus pressure and sore throat. ; ; Cardiovascular: Negative for chest pain, palpitations, diaphoresis, dyspnea and peripheral edema. ; ; Respiratory: Negative for cough, wheezing and stridor. ; ; Gastrointestinal: Negative for nausea, vomiting, diarrhea, abdominal pain, blood in stool, hematemesis, jaundice and rectal bleeding. . ; ; Genitourinary: Negative for dysuria, flank pain and hematuria. ; ; Musculoskeletal: +neck pain. Negative for back pain. Negative for swelling and trauma.; ; Skin: Negative for pruritus, rash, abrasions, blisters, bruising and skin lesion.; ; Neuro: +headache. Negative for lightheadedness and neck stiffness. Negative for weakness, altered level of consciousness, altered mental status, extremity weakness, paresthesias, involuntary movement, seizure and syncope.; Psych:  +anxiety. No SI, no SA, no HI, no hallucinations.     Allergies  Contrast media and Tdap  Home Medications   Prior to Admission medications   Medication Sig Start Date End Date Taking? Authorizing Provider  amLODipine (NORVASC) 5 MG tablet Take 5 mg by mouth every morning.    Historical Provider, MD  ARTIFICIAL TEAR OP Apply  1 drop to eye 2 (two) times daily as needed (DRY EYES).    Historical Provider, MD  benazepril (LOTENSIN) 20 MG tablet Take 20 mg by mouth every morning.    Historical Provider, MD  cholecalciferol (VITAMIN D) 1000 UNITS tablet Take 2,000 Units by mouth daily.    Historical Provider, MD  denosumab (PROLIA) 60 MG/ML SOLN injection Inject 60 mg into the skin every 6 (six) months. Administer in upper arm, thigh, or abdomen    Historical Provider, MD  fentaNYL (DURAGESIC - DOSED MCG/HR) 25 MCG/HR Place 1 patch onto the skin every 3 (three) days.    Historical Provider, MD   fexofenadine (ALLEGRA) 180 MG tablet Take 180 mg by mouth daily as needed (For allergies.).     Historical Provider, MD  fluticasone (FLONASE) 50 MCG/ACT nasal spray Place 2 sprays into the nose daily.     Historical Provider, MD  folic acid (FOLVITE) 1 MG tablet Take 1 mg by mouth daily.    Historical Provider, MD  furosemide (LASIX) 20 MG tablet Take 20 mg by mouth every other day as needed for fluid (For ankle swelling).     Historical Provider, MD  levothyroxine (SYNTHROID, LEVOTHROID) 100 MCG tablet Take 100 mcg by mouth daily before breakfast.    Historical Provider, MD  LORazepam (ATIVAN) 0.5 MG tablet Take 0.25-5 mg by mouth 2 (two) times daily as needed for anxiety.    Historical Provider, MD  methotrexate (RHEUMATREX) 2.5 MG tablet Take 22.5 mg by mouth once a week. Caution:Chemotherapy. Protect from light.  mondays    Historical Provider, MD  Omega-3 Fatty Acids (FISH OIL) 1000 MG CAPS Take 1 capsule by mouth daily.    Historical Provider, MD  omeprazole (PRILOSEC) 20 MG capsule Take 20 mg by mouth every other day.     Historical Provider, MD  oxyCODONE-acetaminophen (PERCOCET) 10-325 MG tablet Take 1 tablet by mouth every 6 (six) hours as needed. Pain 10/04/14   Historical Provider, MD  predniSONE (DELTASONE) 5 MG tablet Take 1 tablet (5 mg total) by mouth daily. 02/15/12   Sheryle Hail, NP  rosuvastatin (CRESTOR) 20 MG tablet Take 10 mg by mouth every 3 (three) days.     Historical Provider, MD  sertraline (ZOLOFT) 50 MG tablet Take 50 mg by mouth daily.     Historical Provider, MD   BP 163/70 mmHg  Pulse 73  Temp(Src) 97.6 F (36.4 C) (Oral)  Resp 16  Ht 5\' 2"  (1.575 m)  Wt 170 lb (77.111 kg)  BMI 31.09 kg/m2  SpO2 98% Physical Exam  0940: Physical examination:  Nursing notes reviewed; Vital signs and O2 SAT reviewed;  Constitutional: Well developed, Well nourished, Well hydrated, In no acute distress; Head:  Normocephalic, atraumatic; Eyes: EOMI, PERRL, No scleral icterus;  ENMT: TM's clear bilat. Mouth and pharynx normal, Mucous membranes moist; Neck: Supple, Full range of motion, No lymphadenopathy; Cardiovascular: Regular rate and rhythm, No gallop; Respiratory: Breath sounds clear & equal bilaterally, No wheezes.  Speaking full sentences with ease, Normal respiratory effort/excursion; Chest: Nontender, Movement normal; Abdomen: Soft, Nontender, Nondistended, Normal bowel sounds; Genitourinary: No CVA tenderness; Spine:  No midline CS, TS, LS tenderness. +TTP left hypertonic trapezius muscle which reproduces pt's pain. No rash.;; Extremities: Pulses normal, No tenderness, No edema, No calf edema or asymmetry.; Neuro: AA&Ox3, Major CN grossly intact.  Speech clear. No gross focal motor or sensory deficits in extremities.; Skin: Color normal, Warm, Dry.   ED Course  Procedures (including critical  care time) Labs Review  Imaging Review  I have personally reviewed and evaluated these images and lab results as part of my medical decision-making.   EKG Interpretation None      MDM  MDM Reviewed: previous chart, nursing note and vitals Interpretation: CT scan    Ct Head Wo Contrast 06/19/2015  CLINICAL DATA:  Neck and occipital pain for 1 week. No known injury. EXAM: CT HEAD WITHOUT CONTRAST CT CERVICAL SPINE WITHOUT CONTRAST TECHNIQUE: Multidetector CT imaging of the head and cervical spine was performed following the standard protocol without intravenous contrast. Multiplanar CT image reconstructions of the cervical spine were also generated. COMPARISON:  Head CT scan 09/07/2012. FINDINGS: CT HEAD FINDINGS Scattered hypoattenuation in the subcortical and periventricular deep white matter is consistent chronic microvascular ischemic change. No evidence of acute intracranial abnormality including hemorrhage, infarct, mass lesion, mass effect, midline shift or abnormal extra-axial fluid collection is identified. No hydrocephalus or pneumocephalus. The calvarium is  intact. Imaged paranasal sinuses and mastoid air cells are clear. CT CERVICAL SPINE FINDINGS No fracture or malalignment is identified. Advanced degenerative change is seen about the C1-2 articulation. Scattered facet degenerative disease appears worst on the left at C2-3 and on the right at C3-4. The lung apices are clear. IMPRESSION: No acute abnormality head or cervical spine. Chronic microvascular ischemic change. Cervical spondylosis. Electronically Signed   By: Inge Rise M.D.   On: 06/19/2015 10:38   Ct Cervical Spine Wo Contrast 06/19/2015  CLINICAL DATA:  Neck and occipital pain for 1 week. No known injury. EXAM: CT HEAD WITHOUT CONTRAST CT CERVICAL SPINE WITHOUT CONTRAST TECHNIQUE: Multidetector CT imaging of the head and cervical spine was performed following the standard protocol without intravenous contrast. Multiplanar CT image reconstructions of the cervical spine were also generated. COMPARISON:  Head CT scan 09/07/2012. FINDINGS: CT HEAD FINDINGS Scattered hypoattenuation in the subcortical and periventricular deep white matter is consistent chronic microvascular ischemic change. No evidence of acute intracranial abnormality including hemorrhage, infarct, mass lesion, mass effect, midline shift or abnormal extra-axial fluid collection is identified. No hydrocephalus or pneumocephalus. The calvarium is intact. Imaged paranasal sinuses and mastoid air cells are clear. CT CERVICAL SPINE FINDINGS No fracture or malalignment is identified. Advanced degenerative change is seen about the C1-2 articulation. Scattered facet degenerative disease appears worst on the left at C2-3 and on the right at C3-4. The lung apices are clear. IMPRESSION: No acute abnormality head or cervical spine. Chronic microvascular ischemic change. Cervical spondylosis. Electronically Signed   By: Inge Rise M.D.   On: 06/19/2015 10:38    0945:  Pt states she has "run out of" her ativan and is requesting another rx.   Controlled Substance Database accessed: pt filled oxycodone 10/APAP 325mg , #60, on 06/09/15; filled fentanyl 34mch/hr patch, #10, on 06/13/2015; and filled ativan 0.5mg , #30, on 06/15/2015. I informed pt that she should not be "out of" a medicine she just filled." Pt then said "when then maybe I have to go pick it up but I'm going to need another rx." ED RN and I had long d/w pt and her family (son and daughter) regarding chronic pain and anxiety meds and chronic pain management in ED (not the appropriate venue) and she would need to contact her Pain Management doctor for adjustments to her chronic narcotic and benzodiazapine meds. Pt and family verb understanding.   1050:  CT scan reassuring. Family and pt both now state "this same thing happened the last time they changed around  her medication." They will contact pt's Pain Management doctor for f/u. Pt and family would like to go home now. Dx and testing d/w pt and family.  Questions answered.  Verb understanding, agreeable to d/c home with outpt f/u.        Francine Graven, DO 06/21/15 2249

## 2015-06-19 NOTE — Discharge Instructions (Signed)
Take your usual prescriptions as previously directed.  Apply moist heat or ice to the area(s) of discomfort, for 15 minutes at a time, several times per day for the next few days.  Do not fall asleep on a heating or ice pack.  Call your regular Pain Management doctor today to schedule a follow up appointment in the next 2 days.  Return to the Emergency Department immediately if worsening.

## 2015-06-21 ENCOUNTER — Encounter (HOSPITAL_COMMUNITY): Payer: Self-pay

## 2015-06-21 ENCOUNTER — Ambulatory Visit (HOSPITAL_COMMUNITY)
Admission: RE | Admit: 2015-06-21 | Discharge: 2015-06-21 | Disposition: A | Discharge status: Home / Self Care | Payer: Medicare Other | Source: Ambulatory Visit | Attending: Internal Medicine | Admitting: Internal Medicine

## 2015-06-21 DIAGNOSIS — M810 Age-related osteoporosis without current pathological fracture: Secondary | ICD-10-CM | POA: Diagnosis not present

## 2015-06-21 MED ORDER — DENOSUMAB 60 MG/ML ~~LOC~~ SOLN
60.0000 mg | Freq: Once | SUBCUTANEOUS | Status: AC
  Administered 2015-06-21: 60 mg via SUBCUTANEOUS
  Filled 2015-06-21: qty 1

## 2015-06-21 NOTE — Discharge Instructions (Signed)
Denosumab injection  What is this medicine?  DENOSUMAB (den oh sue mab) slows bone breakdown. Prolia is used to treat osteoporosis in women after menopause and in men. Xgeva is used to prevent bone fractures and other bone problems caused by cancer bone metastases. Xgeva is also used to treat giant cell tumor of the bone.  This medicine may be used for other purposes; ask your health care provider or pharmacist if you have questions.  What should I tell my health care provider before I take this medicine?  They need to know if you have any of these conditions:  -dental disease  -eczema  -infection or history of infections  -kidney disease or on dialysis  -low blood calcium or vitamin D  -malabsorption syndrome  -scheduled to have surgery or tooth extraction  -taking medicine that contains denosumab  -thyroid or parathyroid disease  -an unusual reaction to denosumab, other medicines, foods, dyes, or preservatives  -pregnant or trying to get pregnant  -breast-feeding  How should I use this medicine?  This medicine is for injection under the skin. It is given by a health care professional in a hospital or clinic setting.  If you are getting Prolia, a special MedGuide will be given to you by the pharmacist with each prescription and refill. Be sure to read this information carefully each time.  For Prolia, talk to your pediatrician regarding the use of this medicine in children. Special care may be needed. For Xgeva, talk to your pediatrician regarding the use of this medicine in children. While this drug may be prescribed for children as young as 13 years for selected conditions, precautions do apply.  Overdosage: If you think you have taken too much of this medicine contact a poison control center or emergency room at once.  NOTE: This medicine is only for you. Do not share this medicine with others.  What if I miss a dose?  It is important not to miss your dose. Call your doctor or health care professional if you are  unable to keep an appointment.  What may interact with this medicine?  Do not take this medicine with any of the following medications:  -other medicines containing denosumab  This medicine may also interact with the following medications:  -medicines that suppress the immune system  -medicines that treat cancer  -steroid medicines like prednisone or cortisone  This list may not describe all possible interactions. Give your health care provider a list of all the medicines, herbs, non-prescription drugs, or dietary supplements you use. Also tell them if you smoke, drink alcohol, or use illegal drugs. Some items may interact with your medicine.  What should I watch for while using this medicine?  Visit your doctor or health care professional for regular checks on your progress. Your doctor or health care professional may order blood tests and other tests to see how you are doing.  Call your doctor or health care professional if you get a cold or other infection while receiving this medicine. Do not treat yourself. This medicine may decrease your body's ability to fight infection.  You should make sure you get enough calcium and vitamin D while you are taking this medicine, unless your doctor tells you not to. Discuss the foods you eat and the vitamins you take with your health care professional.  See your dentist regularly. Brush and floss your teeth as directed. Before you have any dental work done, tell your dentist you are receiving this medicine.  Do   not become pregnant while taking this medicine or for 5 months after stopping it. Women should inform their doctor if they wish to become pregnant or think they might be pregnant. There is a potential for serious side effects to an unborn child. Talk to your health care professional or pharmacist for more information.  What side effects may I notice from receiving this medicine?  Side effects that you should report to your doctor or health care professional as soon as  possible:  -allergic reactions like skin rash, itching or hives, swelling of the face, lips, or tongue  -breathing problems  -chest pain  -fast, irregular heartbeat  -feeling faint or lightheaded, falls  -fever, chills, or any other sign of infection  -muscle spasms, tightening, or twitches  -numbness or tingling  -skin blisters or bumps, or is dry, peels, or red  -slow healing or unexplained pain in the mouth or jaw  -unusual bleeding or bruising  Side effects that usually do not require medical attention (Report these to your doctor or health care professional if they continue or are bothersome.):  -muscle pain  -stomach upset, gas  This list may not describe all possible side effects. Call your doctor for medical advice about side effects. You may report side effects to FDA at 1-800-FDA-1088.  Where should I keep my medicine?  This medicine is only given in a clinic, doctor's office, or other health care setting and will not be stored at home.  NOTE: This sheet is a summary. It may not cover all possible information. If you have questions about this medicine, talk to your doctor, pharmacist, or health care provider.      2016, Elsevier/Gold Standard. (2011-06-24 12:37:47)

## 2015-06-27 DIAGNOSIS — Z79891 Long term (current) use of opiate analgesic: Secondary | ICD-10-CM | POA: Diagnosis not present

## 2015-06-27 DIAGNOSIS — Z79899 Other long term (current) drug therapy: Secondary | ICD-10-CM | POA: Diagnosis not present

## 2015-06-27 DIAGNOSIS — G894 Chronic pain syndrome: Secondary | ICD-10-CM | POA: Diagnosis not present

## 2015-06-30 IMAGING — CT CT CHEST W/O CM
3 of 4 series · 17 of 30 positions shown, 19 images · non-contrast
Comparison: 09/16/2013 chest CT, CT abdomen 07/28/2012

CLINICAL DATA: Followup lung nodules from 1558, former smoker,
hypertension

EXAM:
CT CHEST WITHOUT CONTRAST
TECHNIQUE: Multidetector CT imaging of the chest was performed following the
standard protocol without IV contrast. Sagittal and coronal MPR
images reconstructed from axial data set.

[Series 3: chest w/o · axial · non-contrast · 0.70mm/px · z∈[-218,-18]mm · 5 of 60 slices shown, 7 images]
[im 10/60  mediastinal]
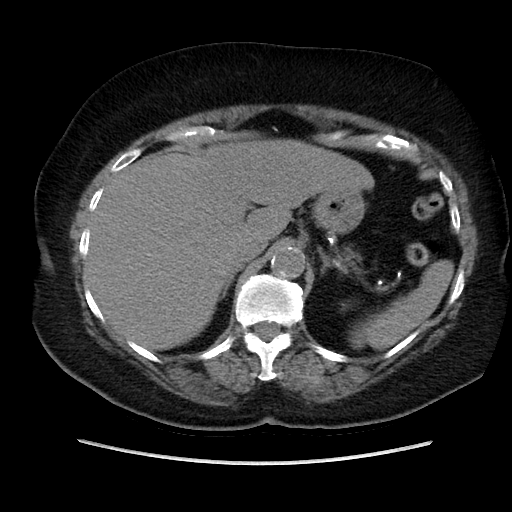
[im 10/60  lung]
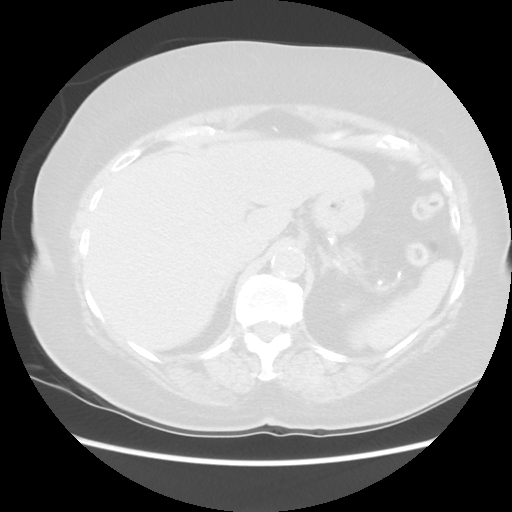
[im 20/60  lung]
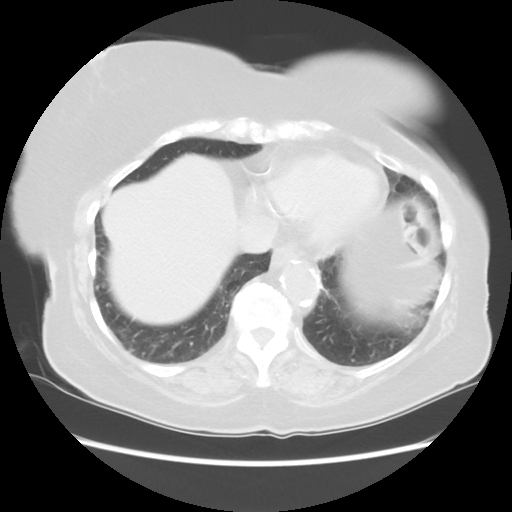
[im 30/60  lung]
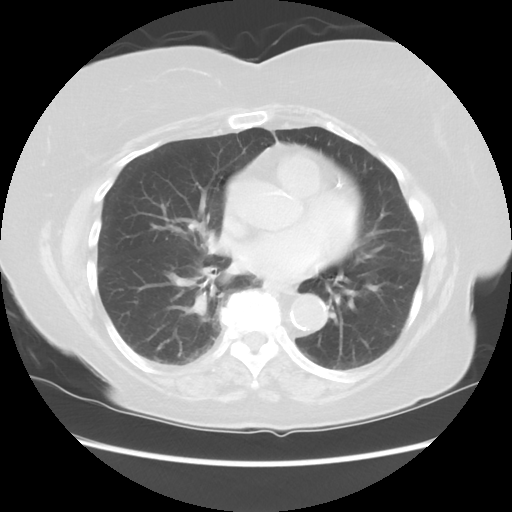
[im 40/60  lung]
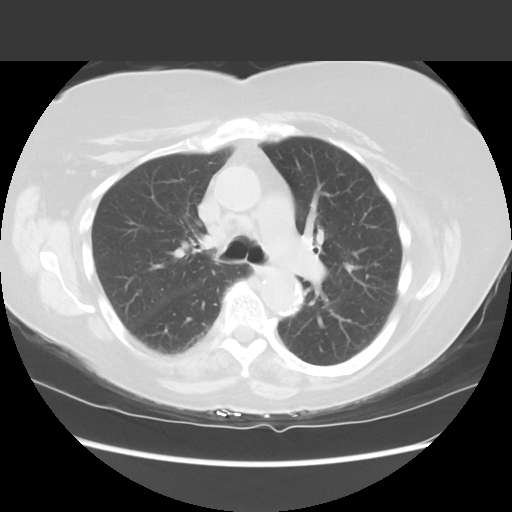
[im 50/60  mediastinal]
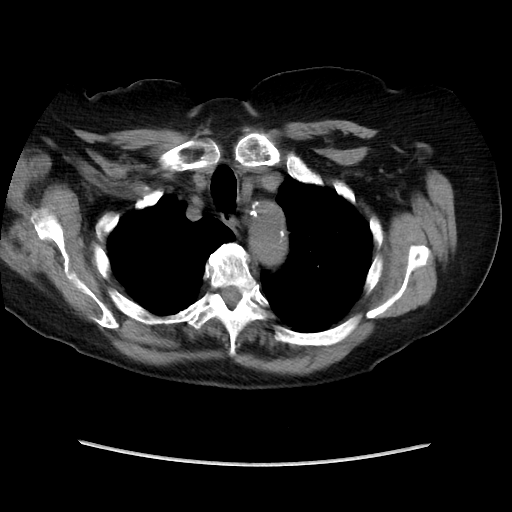
[im 50/60  lung]
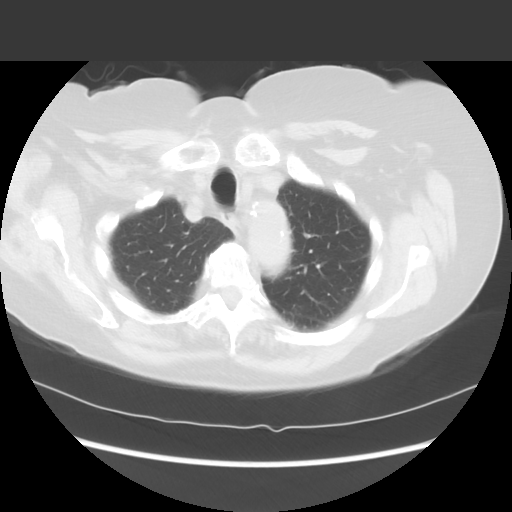

[Series 4: lung windows · axial · 0.70mm/px · z∈[-208,-28]mm · 4 of 60 slices shown]
[im 12/60  lung]
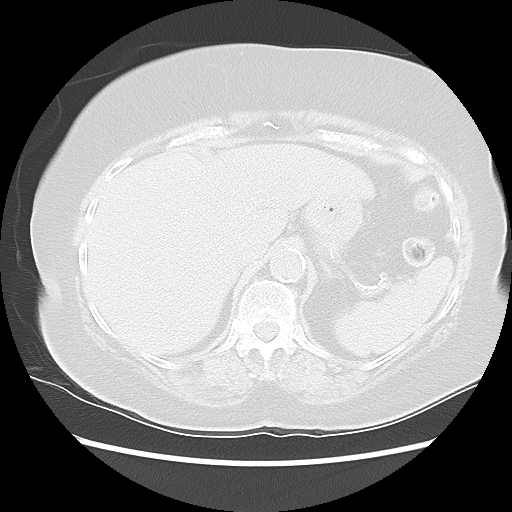
[im 24/60  lung]
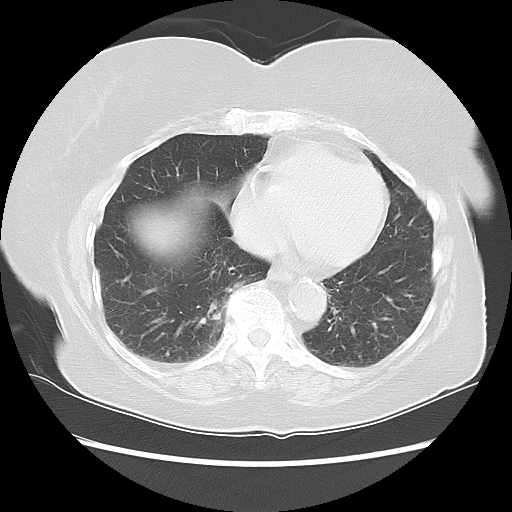
[im 36/60  lung]
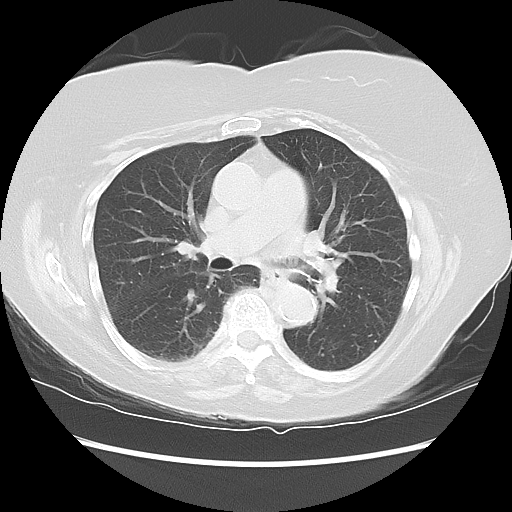
[im 48/60  lung]
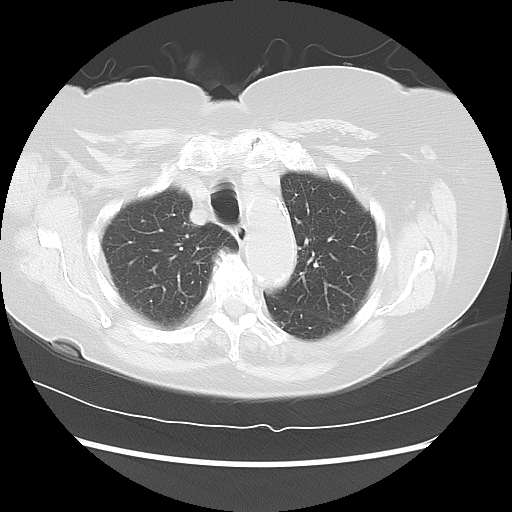

[Series 602: sagittal body · sagittal · 0.70mm/px · 8 of 145 slices shown]
[im 11/145  mediastinal]
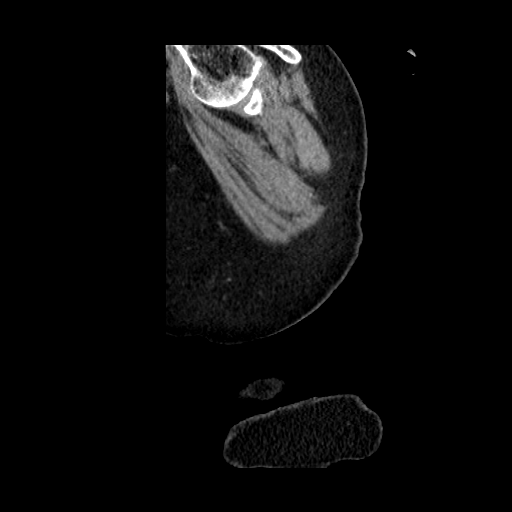
[im 31/145  mediastinal]
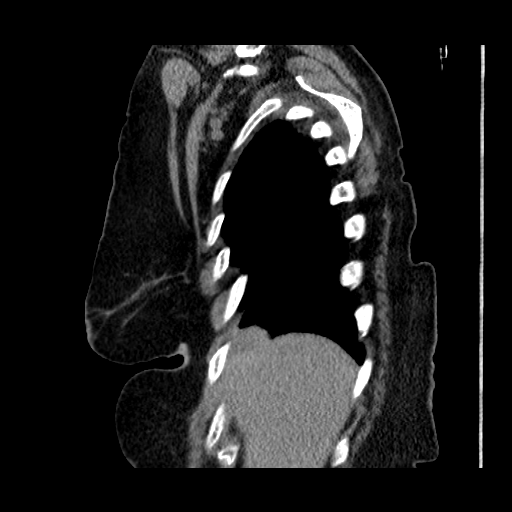
[im 52/145  mediastinal]
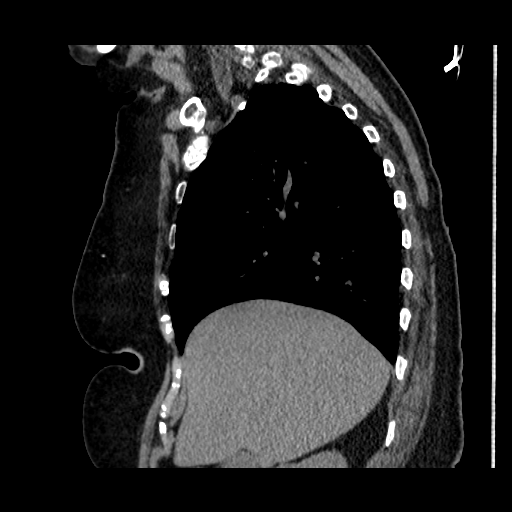
[im 62/145  mediastinal]
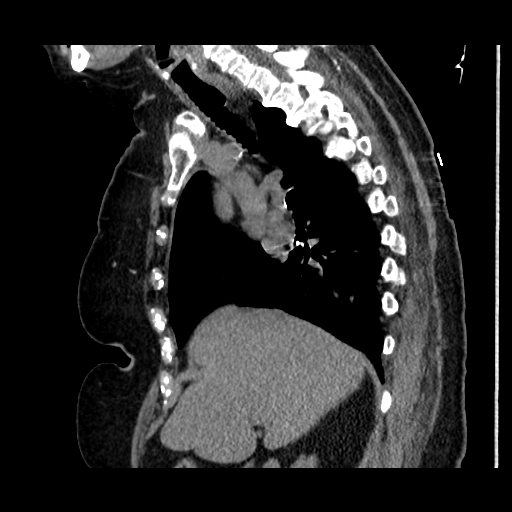
[im 83/145  mediastinal]
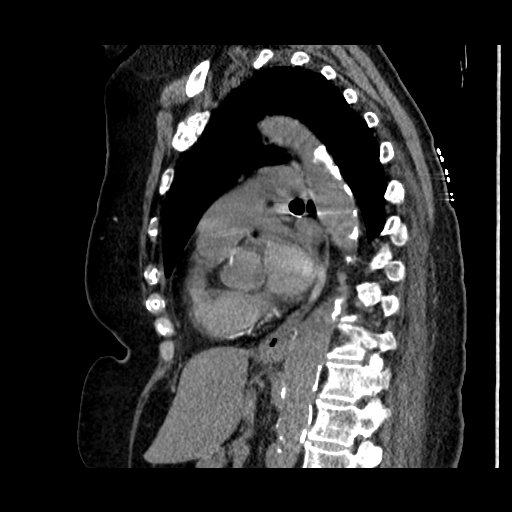
[im 93/145  mediastinal]
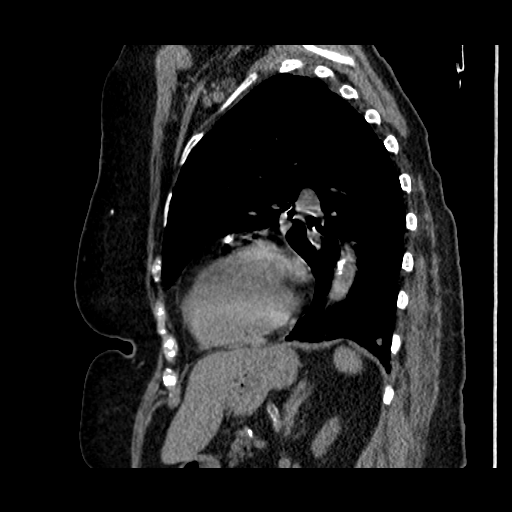
[im 114/145  mediastinal]
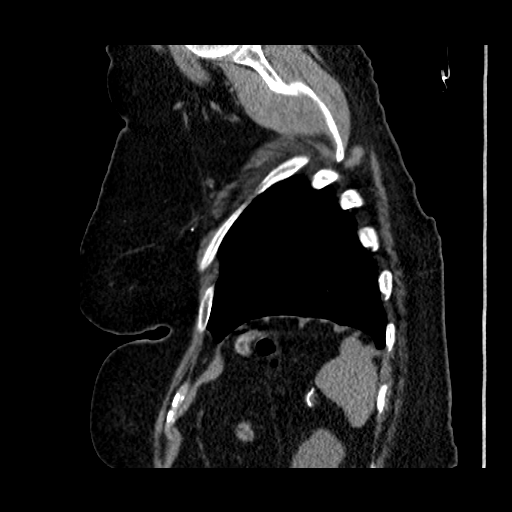
[im 134/145  mediastinal]
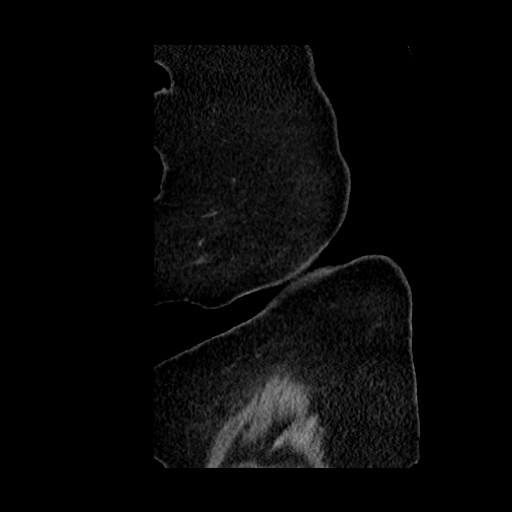

[17 of 30 positions shown; findings below may reference images not displayed]

FINDINGS: Scattered atherosclerotic calcifications aorta, proximal great
vessels, and coronary arteries.

Visualized upper abdomen unremarkable.

Multiple small precarinal and RIGHT paratracheal nodes, none
appearing pathologically enlarged.

No definite thoracic adenopathy, with hilar assessment limited by
lack of IV contrast.

Bones demineralized with degenerative disc disease changes thoracic
spine.

Calcified granuloma superior segment RIGHT lower lobe 2 mm diameter
image 19.

3 mm noncalcified RIGHT lower lobe nodule image 39 unchanged.

7 mm bilobed medial LEFT lower lobe nodule image 43 unchanged.

5 mm LEFT lower lobe nodule at diaphragm image 40 unchanged.

No new or enlarging pulmonary mass/nodule identified.

No pulmonary infiltrate, pleural effusion, or pneumothorax.

Minimal atelectasis at the lung bases.
IMPRESSION: No new intrathoracic abnormalities.

Stable BILATERAL pulmonary nodules 09/16/2013.

Recommend followup CT chest in 6 months with ultimate goal to
demonstrate a total of 2 years of stability and demonstrate benign
behavior.

This recommendation follows the consensus statement: Guidelines for
Management of Small Pulmonary Nodules Detected on CT Scans: A
Statement from the [HOSPITAL] as published in Radiology

## 2015-07-06 ENCOUNTER — Encounter (HOSPITAL_COMMUNITY): Payer: Self-pay

## 2015-07-06 ENCOUNTER — Other Ambulatory Visit (HOSPITAL_COMMUNITY): Payer: Self-pay

## 2015-07-06 ENCOUNTER — Encounter (HOSPITAL_COMMUNITY)
Admission: RE | Admit: 2015-07-06 | Discharge: 2015-07-06 | Disposition: A | Discharge status: Home / Self Care | Payer: Medicare Other | Source: Ambulatory Visit | Attending: Orthopedic Surgery | Admitting: Orthopedic Surgery

## 2015-07-06 DIAGNOSIS — Z01812 Encounter for preprocedural laboratory examination: Secondary | ICD-10-CM | POA: Diagnosis not present

## 2015-07-06 DIAGNOSIS — Z7952 Long term (current) use of systemic steroids: Secondary | ICD-10-CM | POA: Insufficient documentation

## 2015-07-06 DIAGNOSIS — I10 Essential (primary) hypertension: Secondary | ICD-10-CM | POA: Insufficient documentation

## 2015-07-06 DIAGNOSIS — Z01818 Encounter for other preprocedural examination: Secondary | ICD-10-CM | POA: Insufficient documentation

## 2015-07-06 DIAGNOSIS — M25529 Pain in unspecified elbow: Secondary | ICD-10-CM | POA: Diagnosis not present

## 2015-07-06 DIAGNOSIS — G894 Chronic pain syndrome: Secondary | ICD-10-CM | POA: Diagnosis not present

## 2015-07-06 DIAGNOSIS — Z79891 Long term (current) use of opiate analgesic: Secondary | ICD-10-CM | POA: Diagnosis not present

## 2015-07-06 DIAGNOSIS — Z96642 Presence of left artificial hip joint: Secondary | ICD-10-CM | POA: Insufficient documentation

## 2015-07-06 DIAGNOSIS — E890 Postprocedural hypothyroidism: Secondary | ICD-10-CM | POA: Insufficient documentation

## 2015-07-06 DIAGNOSIS — M810 Age-related osteoporosis without current pathological fracture: Secondary | ICD-10-CM | POA: Insufficient documentation

## 2015-07-06 DIAGNOSIS — E785 Hyperlipidemia, unspecified: Secondary | ICD-10-CM | POA: Insufficient documentation

## 2015-07-06 DIAGNOSIS — Z79899 Other long term (current) drug therapy: Secondary | ICD-10-CM | POA: Insufficient documentation

## 2015-07-06 DIAGNOSIS — Z87891 Personal history of nicotine dependence: Secondary | ICD-10-CM | POA: Diagnosis not present

## 2015-07-06 DIAGNOSIS — F419 Anxiety disorder, unspecified: Secondary | ICD-10-CM | POA: Insufficient documentation

## 2015-07-06 DIAGNOSIS — G8929 Other chronic pain: Secondary | ICD-10-CM | POA: Diagnosis not present

## 2015-07-06 DIAGNOSIS — Z0183 Encounter for blood typing: Secondary | ICD-10-CM | POA: Insufficient documentation

## 2015-07-06 DIAGNOSIS — M545 Low back pain: Secondary | ICD-10-CM | POA: Diagnosis not present

## 2015-07-06 DIAGNOSIS — M47817 Spondylosis without myelopathy or radiculopathy, lumbosacral region: Secondary | ICD-10-CM | POA: Diagnosis not present

## 2015-07-06 DIAGNOSIS — M069 Rheumatoid arthritis, unspecified: Secondary | ICD-10-CM | POA: Diagnosis not present

## 2015-07-06 HISTORY — DX: Urinary tract infection, site not specified: N39.0

## 2015-07-06 HISTORY — DX: Unspecified osteoarthritis, unspecified site: M19.90

## 2015-07-06 LAB — CBC
HCT: 40.9 % (ref 36.0–46.0)
HEMOGLOBIN: 12.6 g/dL (ref 12.0–15.0)
MCH: 27.9 pg (ref 26.0–34.0)
MCHC: 30.8 g/dL (ref 30.0–36.0)
MCV: 90.7 fL (ref 78.0–100.0)
PLATELETS: 372 10*3/uL (ref 150–400)
RBC: 4.51 MIL/uL (ref 3.87–5.11)
RDW: 19 % — ABNORMAL HIGH (ref 11.5–15.5)
WBC: 8.3 10*3/uL (ref 4.0–10.5)

## 2015-07-06 LAB — TYPE AND SCREEN
ABO/RH(D): A POS
Antibody Screen: NEGATIVE

## 2015-07-06 LAB — BASIC METABOLIC PANEL
Anion gap: 7 (ref 5–15)
BUN: 13 mg/dL (ref 6–20)
CHLORIDE: 105 mmol/L (ref 101–111)
CO2: 26 mmol/L (ref 22–32)
CREATININE: 0.69 mg/dL (ref 0.44–1.00)
Calcium: 8.9 mg/dL (ref 8.9–10.3)
GFR calc Af Amer: >60 mL/min (ref >60)
GFR calc non Af Amer: >60 mL/min (ref >60)
GLUCOSE: 114 mg/dL — AB (ref 65–99)
POTASSIUM: 3.7 mmol/L (ref 3.5–5.1)
SODIUM: 138 mmol/L (ref 135–145)

## 2015-07-06 LAB — SURGICAL PCR SCREEN
MRSA, PCR: POSITIVE — AB
Staphylococcus aureus: POSITIVE — AB

## 2015-07-06 NOTE — Pre-Procedure Instructions (Signed)
Jocelyn Bauer  07/06/2015      [id number] - [place], [place] - [address] [place] [address] Phone: [phone] Fax: [place] [address] [place] [address] [place] [address] Phone: [phone] Fax: [phone]  [facility] [place], [place] - [id number] [place] [address] Phone: [phone] Fax: [phone]    Your procedure is scheduled on Thursday, July 6th, 2017.  Report to [facility] Admitting at 10:45 A.M.   Call this number if you have problems the morning of surgery:  [phone]   Remember:  Do not eat food or drink liquids after midnight.   Take these medicines the morning of surgery with A SIP OF WATER: Amlodipine (Norvasc), Fexofenadine (Allegra), Levothyroxine (Synthroid), Lorazepam (Ativan) if needed, Omeprazole (Prilosec), Oxycodone-Acetaminophen (Percocet) if needed, Prednisone (Deltasone), Sertraline (Zoloft).    Stop taking: Aspirin, NSAIDS, Aleve, Naproxen, Ibuprofen, Advil, Motrin, BC's, Goody's, Fish oil, all herbal medications, and all vitamins.    Do not wear jewelry, make-up or nail polish.  Do not wear lotions, powders, or perfumes.    Do not shave 48 hours prior to surgery.     Do not bring valuables to the hospital.  [facility] is not responsible for any belongings or valuables.  Contacts, dentures or bridgework may not be worn into surgery.  Leave your suitcase in the car.  After surgery it may be brought to your room.  For patients admitted to the hospital, discharge time will be determined by your treatment team.  Patients discharged the day of surgery will not be allowed to drive home.   Special instructions:  Preparing for Surgery.   Please read over the following fact sheets that you were given. MRSA Information    [facility]- Preparing For Surgery  Before surgery, you can play an important role. Because skin is not sterile, your skin needs to be as free of germs as possible. You can reduce the number of germs on your skin by washing with CHG (chlorahexidine gluconate) Soap before surgery.  CHG is an antiseptic cleaner which kills germs and bonds with the skin to continue killing germs even after washing.  Please do not use if you have an allergy to CHG or antibacterial soaps. If your skin becomes reddened/irritated stop using the CHG.  Do not shave (including legs and underarms) for at least 48 hours prior to first CHG shower. It is OK to shave your face.  Please follow these instructions carefully.   1. Shower the NIGHT BEFORE SURGERY and the MORNING OF SURGERY with CHG.   2. If you chose to wash your hair, wash your hair first as usual with your normal shampoo.  3. After you shampoo, rinse your hair and body thoroughly to remove the shampoo.  4. Use CHG as you would any other liquid soap. You can apply CHG directly to the skin and wash gently with a scrungie or a clean washcloth.   5. Apply the CHG Soap to your body ONLY FROM THE NECK DOWN.  Do not use on open wounds or open sores. Avoid contact with your eyes, ears, mouth and genitals (private parts). Wash genitals (private parts) with your normal soap.  6. Wash thoroughly, paying special attention to the area where your surgery will be performed.  7. Thoroughly rinse your body with  warm water from the neck down.  8. DO NOT shower/wash with your normal soap after using and rinsing off the CHG Soap.  9. Pat yourself dry with a CLEAN TOWEL.   10. Wear CLEAN PAJAMAS   11. Place CLEAN SHEETS on your bed the night of your first shower and DO NOT SLEEP WITH PETS.  Day of Surgery: Do not apply any deodorants/lotions. Please wear clean clothes to the hospital/surgery center.

## 2015-07-06 NOTE — Progress Notes (Signed)
PCP - Dr. [name] Cardiologist - denies but did go to [facility] for testing and clearance  EKG - requested CXR - denies Echo - 2009 Stress test - requested Cardiac Cath - denies  Patient denies chest pain and shortness of breath at PAT appointment.

## 2015-07-06 NOTE — Progress Notes (Signed)
Attempted to reach patient at number given during PAT appointment to notify of positive PCR results.  No answer and unable to leave voicemail. Prescription called to [facility] on [address].

## 2015-07-07 NOTE — Progress Notes (Signed)
Anesthesia Chart Review: Patient is a 82 year old female scheduled for right total elbow revision arthroplasty with hardware removal, possible triceps reconstruction with allograft and repair as needed on 07/13/2015 by Dr. Amedeo Plenty.  History includes former smoker, RA, HTN, SOB, HLD, thyroidectomy with secondary hypothyroidism, anxiety/panic attacks, chronic pain, osteoporosis, left THA. PCP is Dr. [name]. Patient was cleared for surgery by Dr. Woody Seller with [facility].  Meds include amlodipine, benazepril, Prolia, fentanyl patch, Allegra, Flonase, Folvite, Lasix, Synthroid, Ativan, methotrexate (on hold) fish oil, Prilosec, Percocet, prednisone, Crestor, Zoloft.  PAT Vitals: BP 141/54, HR 83, RR 18, T 36.3C, O2 sat 99%.  05/24/15 EKG [facility] CV): NSR, non-specific inferior T waves changes.  05/26/15 Nuclear stress test [facility] CV): Impression:  1. The resting EKG demonstrated NSR, normal resting conduction, no resting arrhythmias and nonspecific ST-T changes. Stress EKG is nondiagnostic for ischemia. Stress symptoms include dizziness. 2. Myocardial perfusion imaging is normal. Overall left ventricular systolic function was normal without regional wall motion abnormalities. The left ventricular ejection fraction was 54%.  01/15/07 Echo: SUMMARY - Overall left ventricular systolic function was normal. Left    ventricular ejection fraction was estimated , range being 55    % to 60 %. There appeared to be mild hypokinesis of the mid    inferoseptal wall. Regional myocardial contractile function    was otherwise normal. Doppler parameters were consistent with    abnormal left ventricular relaxation. IMPRESSIONS - There was no echocardiographic evidence for a cardiac source of    embolism.  01/05/15 PFTs (ordered by Dr. Marton Redwood): FVC 1.79 (83%), FEV1 1.55 (98%), DLCOunc 8.66 (40%).  06/19/15 CT c-spine: CT CERVICAL SPINE FINDINGS No fracture or  malalignment is identified. Advanced degenerative change is seen about the C1-2 articulation. Scattered facet degenerative disease appears worst on the left at C2-3 and on the right at C3-4. The lung apices are clear.  Preoperative labs noted.   If no acute changes then I would anticipate that she can proceed as planned.  [facility]/Anesthesiology Phone [phone] 07/07/2015 12:43 PM

## 2015-07-12 MED ORDER — CEFAZOLIN SODIUM-DEXTROSE 2-4 GM/100ML-% IV SOLN
2.0000 g | INTRAVENOUS | Status: AC
  Administered 2015-07-13: 2 g via INTRAVENOUS
  Filled 2015-07-12: qty 100

## 2015-07-13 ENCOUNTER — Inpatient Hospital Stay (HOSPITAL_COMMUNITY): Payer: Medicare Other | Admitting: Vascular Surgery

## 2015-07-13 ENCOUNTER — Encounter (HOSPITAL_COMMUNITY): Payer: Self-pay | Admitting: Certified Registered Nurse Anesthetist

## 2015-07-13 ENCOUNTER — Inpatient Hospital Stay (HOSPITAL_COMMUNITY)
Admission: RE | Admit: 2015-07-13 | Discharge: 2015-07-19 | DRG: 483 | Disposition: A | Discharge status: Skilled Nursing Facility | Payer: Medicare Other | Source: Ambulatory Visit | Attending: Orthopedic Surgery | Admitting: Orthopedic Surgery

## 2015-07-13 ENCOUNTER — Inpatient Hospital Stay (HOSPITAL_COMMUNITY): Payer: Medicare Other | Admitting: Certified Registered Nurse Anesthetist

## 2015-07-13 ENCOUNTER — Encounter (HOSPITAL_COMMUNITY)
Admission: RE | Disposition: A | Discharge status: Skilled Nursing Facility | Payer: Self-pay | Source: Ambulatory Visit | Attending: Orthopedic Surgery

## 2015-07-13 DIAGNOSIS — G8918 Other acute postprocedural pain: Secondary | ICD-10-CM | POA: Diagnosis not present

## 2015-07-13 DIAGNOSIS — Z79899 Other long term (current) drug therapy: Secondary | ICD-10-CM | POA: Diagnosis not present

## 2015-07-13 DIAGNOSIS — M810 Age-related osteoporosis without current pathological fracture: Secondary | ICD-10-CM | POA: Diagnosis present

## 2015-07-13 DIAGNOSIS — F329 Major depressive disorder, single episode, unspecified: Secondary | ICD-10-CM | POA: Diagnosis present

## 2015-07-13 DIAGNOSIS — K219 Gastro-esophageal reflux disease without esophagitis: Secondary | ICD-10-CM | POA: Diagnosis present

## 2015-07-13 DIAGNOSIS — Z91041 Radiographic dye allergy status: Secondary | ICD-10-CM

## 2015-07-13 DIAGNOSIS — I10 Essential (primary) hypertension: Secondary | ICD-10-CM | POA: Diagnosis present

## 2015-07-13 DIAGNOSIS — T84038A Mechanical loosening of other internal prosthetic joint, initial encounter: Secondary | ICD-10-CM | POA: Diagnosis not present

## 2015-07-13 DIAGNOSIS — M6281 Muscle weakness (generalized): Secondary | ICD-10-CM | POA: Diagnosis not present

## 2015-07-13 DIAGNOSIS — K5900 Constipation, unspecified: Secondary | ICD-10-CM | POA: Diagnosis not present

## 2015-07-13 DIAGNOSIS — M069 Rheumatoid arthritis, unspecified: Secondary | ICD-10-CM | POA: Diagnosis present

## 2015-07-13 DIAGNOSIS — Z79891 Long term (current) use of opiate analgesic: Secondary | ICD-10-CM

## 2015-07-13 DIAGNOSIS — Z87891 Personal history of nicotine dependence: Secondary | ICD-10-CM

## 2015-07-13 DIAGNOSIS — Y792 Prosthetic and other implants, materials and accessory orthopedic devices associated with adverse incidents: Secondary | ICD-10-CM | POA: Diagnosis present

## 2015-07-13 DIAGNOSIS — Z96622 Presence of left artificial elbow joint: Secondary | ICD-10-CM | POA: Diagnosis present

## 2015-07-13 DIAGNOSIS — Z7951 Long term (current) use of inhaled steroids: Secondary | ICD-10-CM | POA: Diagnosis not present

## 2015-07-13 DIAGNOSIS — M542 Cervicalgia: Secondary | ICD-10-CM | POA: Diagnosis not present

## 2015-07-13 DIAGNOSIS — G8929 Other chronic pain: Secondary | ICD-10-CM

## 2015-07-13 DIAGNOSIS — M25521 Pain in right elbow: Secondary | ICD-10-CM | POA: Diagnosis not present

## 2015-07-13 DIAGNOSIS — E785 Hyperlipidemia, unspecified: Secondary | ICD-10-CM | POA: Diagnosis not present

## 2015-07-13 DIAGNOSIS — E039 Hypothyroidism, unspecified: Secondary | ICD-10-CM | POA: Diagnosis present

## 2015-07-13 DIAGNOSIS — Z96642 Presence of left artificial hip joint: Secondary | ICD-10-CM | POA: Diagnosis present

## 2015-07-13 DIAGNOSIS — J309 Allergic rhinitis, unspecified: Secondary | ICD-10-CM | POA: Diagnosis not present

## 2015-07-13 DIAGNOSIS — M1990 Unspecified osteoarthritis, unspecified site: Secondary | ICD-10-CM | POA: Diagnosis not present

## 2015-07-13 DIAGNOSIS — F419 Anxiety disorder, unspecified: Secondary | ICD-10-CM | POA: Diagnosis present

## 2015-07-13 DIAGNOSIS — Z96621 Presence of right artificial elbow joint: Secondary | ICD-10-CM | POA: Diagnosis present

## 2015-07-13 DIAGNOSIS — M549 Dorsalgia, unspecified: Secondary | ICD-10-CM

## 2015-07-13 DIAGNOSIS — M6588 Other synovitis and tenosynovitis, other site: Secondary | ICD-10-CM | POA: Diagnosis not present

## 2015-07-13 DIAGNOSIS — Z887 Allergy status to serum and vaccine status: Secondary | ICD-10-CM | POA: Diagnosis not present

## 2015-07-13 DIAGNOSIS — M479 Spondylosis, unspecified: Secondary | ICD-10-CM | POA: Diagnosis present

## 2015-07-13 DIAGNOSIS — M25529 Pain in unspecified elbow: Secondary | ICD-10-CM | POA: Diagnosis not present

## 2015-07-13 DIAGNOSIS — H04129 Dry eye syndrome of unspecified lacrimal gland: Secondary | ICD-10-CM | POA: Diagnosis not present

## 2015-07-13 DIAGNOSIS — H6120 Impacted cerumen, unspecified ear: Secondary | ICD-10-CM | POA: Diagnosis not present

## 2015-07-13 DIAGNOSIS — T84038D Mechanical loosening of other internal prosthetic joint, subsequent encounter: Secondary | ICD-10-CM | POA: Diagnosis not present

## 2015-07-13 HISTORY — DX: Other specific joint derangements of unspecified elbow, not elsewhere classified: M24.829

## 2015-07-13 HISTORY — PX: TOTAL ELBOW ARTHROPLASTY: SHX812

## 2015-07-13 HISTORY — PX: HARDWARE REMOVAL: SHX979

## 2015-07-13 HISTORY — PX: TRICEPS TENDON REPAIR: SHX2577

## 2015-07-13 LAB — BASIC METABOLIC PANEL
ANION GAP: 7 (ref 5–15)
BUN: 8 mg/dL (ref 6–20)
CALCIUM: 8.3 mg/dL — AB (ref 8.9–10.3)
CO2: 27 mmol/L (ref 22–32)
Chloride: 103 mmol/L (ref 101–111)
Creatinine, Ser: 0.7 mg/dL (ref 0.44–1.00)
GFR calc Af Amer: >60 mL/min (ref >60)
Glucose, Bld: 117 mg/dL — ABNORMAL HIGH (ref 65–99)
POTASSIUM: 4.2 mmol/L (ref 3.5–5.1)
SODIUM: 137 mmol/L (ref 135–145)

## 2015-07-13 LAB — CBC
HEMATOCRIT: 33.7 % — AB (ref 36.0–46.0)
Hemoglobin: 10.3 g/dL — ABNORMAL LOW (ref 12.0–15.0)
MCH: 27.8 pg (ref 26.0–34.0)
MCHC: 30.6 g/dL (ref 30.0–36.0)
MCV: 90.8 fL (ref 78.0–100.0)
Platelets: 345 10*3/uL (ref 150–400)
RBC: 3.71 MIL/uL — ABNORMAL LOW (ref 3.87–5.11)
RDW: 18.6 % — AB (ref 11.5–15.5)
WBC: 10.7 10*3/uL — AB (ref 4.0–10.5)

## 2015-07-13 SURGERY — ARTHROPLASTY, ELBOW, TOTAL
Anesthesia: Regional | Site: Elbow | Laterality: Right

## 2015-07-13 MED ORDER — FLUTICASONE PROPIONATE 50 MCG/ACT NA SUSP
2.0000 | Freq: Every day | NASAL | Status: DC
  Administered 2015-07-14 – 2015-07-19 (×5): 2 via NASAL
  Filled 2015-07-13: qty 16

## 2015-07-13 MED ORDER — FENTANYL CITRATE (PF) 250 MCG/5ML IJ SOLN
INTRAMUSCULAR | Status: DC | PRN
  Administered 2015-07-13: 50 ug via INTRAVENOUS

## 2015-07-13 MED ORDER — METHOCARBAMOL 500 MG PO TABS
ORAL_TABLET | ORAL | Status: AC
  Administered 2015-07-13: 500 mg
  Filled 2015-07-13: qty 1

## 2015-07-13 MED ORDER — VANCOMYCIN HCL IN DEXTROSE 750-5 MG/150ML-% IV SOLN
750.0000 mg | INTRAVENOUS | Status: AC
  Administered 2015-07-13 – 2015-07-17 (×5): 750 mg via INTRAVENOUS
  Filled 2015-07-13 (×5): qty 150

## 2015-07-13 MED ORDER — ARTIFICIAL TEARS OP OINT
TOPICAL_OINTMENT | OPHTHALMIC | Status: AC
  Filled 2015-07-13: qty 3.5

## 2015-07-13 MED ORDER — VITAMIN C 500 MG PO TABS
1000.0000 mg | ORAL_TABLET | Freq: Every day | ORAL | Status: DC
  Administered 2015-07-14 – 2015-07-19 (×6): 1000 mg via ORAL
  Filled 2015-07-13 (×6): qty 2

## 2015-07-13 MED ORDER — SUGAMMADEX SODIUM 200 MG/2ML IV SOLN
INTRAVENOUS | Status: DC | PRN
  Administered 2015-07-13: 146.4 mg via INTRAVENOUS

## 2015-07-13 MED ORDER — VITAMIN D 1000 UNITS PO TABS
2000.0000 [IU] | ORAL_TABLET | Freq: Every day | ORAL | Status: DC
  Administered 2015-07-14 – 2015-07-19 (×6): 2000 [IU] via ORAL
  Filled 2015-07-13 (×6): qty 2

## 2015-07-13 MED ORDER — PROPOFOL 10 MG/ML IV BOLUS
INTRAVENOUS | Status: DC | PRN
  Administered 2015-07-13: 120 mg via INTRAVENOUS
  Administered 2015-07-13: 40 mg via INTRAVENOUS

## 2015-07-13 MED ORDER — GLYCOPYRROLATE 0.2 MG/ML IJ SOLN
INTRAMUSCULAR | Status: DC | PRN
  Administered 2015-07-13: 0.1 mg via INTRAVENOUS

## 2015-07-13 MED ORDER — PHENYLEPHRINE HCL 10 MG/ML IJ SOLN: 10.0000 mg | INTRAVENOUS | Status: DC | PRN

## 2015-07-13 MED ORDER — LIDOCAINE HCL (CARDIAC) 20 MG/ML IV SOLN
INTRAVENOUS | Status: DC | PRN
  Administered 2015-07-13: 20 mg via INTRATRACHEAL

## 2015-07-13 MED ORDER — PROPOFOL 10 MG/ML IV BOLUS
INTRAVENOUS | Status: AC
  Filled 2015-07-13: qty 20

## 2015-07-13 MED ORDER — BENAZEPRIL HCL 20 MG PO TABS
20.0000 mg | ORAL_TABLET | Freq: Every morning | ORAL | Status: DC
  Administered 2015-07-14 – 2015-07-19 (×6): 20 mg via ORAL
  Filled 2015-07-13 (×6): qty 1

## 2015-07-13 MED ORDER — HYDROCORTISONE NA SUCCINATE PF 100 MG IJ SOLR
50.0000 mg | Freq: Once | INTRAMUSCULAR | Status: AC
  Administered 2015-07-13: 50 mg via INTRAVENOUS
  Filled 2015-07-13: qty 1

## 2015-07-13 MED ORDER — METOPROLOL TARTRATE 5 MG/5ML IV SOLN
INTRAVENOUS | Status: AC
  Filled 2015-07-13: qty 5

## 2015-07-13 MED ORDER — ONDANSETRON HCL 4 MG/2ML IJ SOLN
INTRAMUSCULAR | Status: DC | PRN
  Administered 2015-07-13 (×2): 4 mg via INTRAVENOUS

## 2015-07-13 MED ORDER — 0.9 % SODIUM CHLORIDE (POUR BTL) OPTIME
TOPICAL | Status: DC | PRN
  Administered 2015-07-13 (×3): 1000 mL

## 2015-07-13 MED ORDER — ONDANSETRON HCL 4 MG PO TABS: 4.0000 mg | ORAL_TABLET | Freq: Four times a day (QID) | ORAL | Status: DC | PRN

## 2015-07-13 MED ORDER — LACTATED RINGERS IV SOLN: INTRAVENOUS | Status: DC

## 2015-07-13 MED ORDER — FENTANYL CITRATE (PF) 100 MCG/2ML IJ SOLN
INTRAMUSCULAR | Status: AC
  Administered 2015-07-13: 50 ug
  Filled 2015-07-13: qty 2

## 2015-07-13 MED ORDER — SERTRALINE HCL 50 MG PO TABS
50.0000 mg | ORAL_TABLET | Freq: Every day | ORAL | Status: DC
  Administered 2015-07-13 – 2015-07-19 (×7): 50 mg via ORAL
  Filled 2015-07-13 (×8): qty 1

## 2015-07-13 MED ORDER — FENTANYL CITRATE (PF) 250 MCG/5ML IJ SOLN
INTRAMUSCULAR | Status: AC
  Filled 2015-07-13: qty 5

## 2015-07-13 MED ORDER — ROPIVACAINE HCL 5 MG/ML IJ SOLN
INTRAMUSCULAR | Status: DC | PRN
  Administered 2015-07-13: 30 mL via PERINEURAL

## 2015-07-13 MED ORDER — GLYCOPYRROLATE 0.2 MG/ML IV SOSY
PREFILLED_SYRINGE | INTRAVENOUS | Status: AC
  Filled 2015-07-13: qty 3

## 2015-07-13 MED ORDER — PANTOPRAZOLE SODIUM 40 MG PO TBEC
40.0000 mg | DELAYED_RELEASE_TABLET | Freq: Every day | ORAL | Status: DC
  Administered 2015-07-13 – 2015-07-19 (×7): 40 mg via ORAL
  Filled 2015-07-13 (×7): qty 1

## 2015-07-13 MED ORDER — NAPHAZOLINE-GLYCERIN 0.012-0.2 % OP SOLN: 1.0000 [drp] | Freq: Four times a day (QID) | OPHTHALMIC | Status: DC | PRN

## 2015-07-13 MED ORDER — OXYCODONE HCL 5 MG PO TABS
ORAL_TABLET | ORAL | Status: AC
  Administered 2015-07-13: 10 mg via ORAL
  Filled 2015-07-13: qty 2

## 2015-07-13 MED ORDER — LORAZEPAM 0.5 MG PO TABS
0.2500 mg | ORAL_TABLET | Freq: Two times a day (BID) | ORAL | Status: DC | PRN
  Administered 2015-07-14 – 2015-07-15 (×3): 0.5 mg via ORAL
  Administered 2015-07-16: 2 mg via ORAL
  Administered 2015-07-17 – 2015-07-19 (×3): 0.5 mg via ORAL
  Filled 2015-07-13: qty 1
  Filled 2015-07-13: qty 4
  Filled 2015-07-13 (×6): qty 1

## 2015-07-13 MED ORDER — FENTANYL 50 MCG/HR TD PT72
50.0000 ug | MEDICATED_PATCH | TRANSDERMAL | Status: DC
  Administered 2015-07-15 – 2015-07-18 (×2): 50 ug via TRANSDERMAL
  Filled 2015-07-13 (×2): qty 1

## 2015-07-13 MED ORDER — PREDNISONE 5 MG PO TABS
5.0000 mg | ORAL_TABLET | Freq: Every day | ORAL | Status: DC
  Administered 2015-07-13 – 2015-07-19 (×7): 5 mg via ORAL
  Filled 2015-07-13 (×7): qty 1

## 2015-07-13 MED ORDER — OMEGA-3-ACID ETHYL ESTERS 1 G PO CAPS
1.0000 g | ORAL_CAPSULE | Freq: Every day | ORAL | Status: DC
  Administered 2015-07-13 – 2015-07-19 (×7): 1 g via ORAL
  Filled 2015-07-13 (×6): qty 1

## 2015-07-13 MED ORDER — ROCURONIUM BROMIDE 100 MG/10ML IV SOLN
INTRAVENOUS | Status: DC | PRN
  Administered 2015-07-13: 50 mg via INTRAVENOUS

## 2015-07-13 MED ORDER — PHENYLEPHRINE HCL 10 MG/ML IJ SOLN
10.0000 mg | INTRAVENOUS | Status: DC | PRN
  Administered 2015-07-13: 100 ug/min via INTRAVENOUS

## 2015-07-13 MED ORDER — ALBUMIN HUMAN 5 % IV SOLN
INTRAVENOUS | Status: DC | PRN
  Administered 2015-07-13: 16:00:00 via INTRAVENOUS

## 2015-07-13 MED ORDER — CHLORHEXIDINE GLUCONATE 4 % EX LIQD: 60.0000 mL | Freq: Once | CUTANEOUS | Status: DC

## 2015-07-13 MED ORDER — OXYCODONE HCL 5 MG PO TABS
10.0000 mg | ORAL_TABLET | Freq: Once | ORAL | Status: AC | PRN
  Administered 2015-07-13: 10 mg via ORAL

## 2015-07-13 MED ORDER — HYDROMORPHONE HCL 1 MG/ML IJ SOLN
0.2500 mg | INTRAMUSCULAR | Status: DC | PRN
Start: 2015-07-13 — End: 2015-07-13

## 2015-07-13 MED ORDER — OXYCODONE HCL 5 MG PO TABS
5.0000 mg | ORAL_TABLET | ORAL | Status: DC | PRN
  Administered 2015-07-13 – 2015-07-14 (×4): 10 mg via ORAL
  Administered 2015-07-14: 5 mg via ORAL
  Administered 2015-07-14 – 2015-07-15 (×4): 10 mg via ORAL
  Administered 2015-07-15: 5 mg via ORAL
  Administered 2015-07-15 – 2015-07-19 (×14): 10 mg via ORAL
  Filled 2015-07-13 (×14): qty 2
  Filled 2015-07-13: qty 1
  Filled 2015-07-13 (×3): qty 2
  Filled 2015-07-13: qty 1
  Filled 2015-07-13 (×4): qty 2
  Filled 2015-07-13: qty 1
  Filled 2015-07-13: qty 2

## 2015-07-13 MED ORDER — OXYCODONE HCL 5 MG/5ML PO SOLN: 10.0000 mg | Freq: Once | ORAL | Status: AC | PRN

## 2015-07-13 MED ORDER — ONDANSETRON HCL 4 MG/2ML IJ SOLN
INTRAMUSCULAR | Status: AC
  Filled 2015-07-13: qty 2

## 2015-07-13 MED ORDER — METOPROLOL TARTARATE 1 MG/ML SYRINGE (5ML)
Status: DC | PRN
  Administered 2015-07-13 (×2): 1 mg via INTRAVENOUS

## 2015-07-13 MED ORDER — FOLIC ACID 1 MG PO TABS
1.0000 mg | ORAL_TABLET | Freq: Every day | ORAL | Status: DC
  Administered 2015-07-13 – 2015-07-19 (×7): 1 mg via ORAL
  Filled 2015-07-13 (×8): qty 1

## 2015-07-13 MED ORDER — PIPERACILLIN-TAZOBACTAM 3.375 G IVPB
3.3750 g | Freq: Three times a day (TID) | INTRAVENOUS | Status: AC
  Administered 2015-07-14 – 2015-07-17 (×12): 3.375 g via INTRAVENOUS
  Filled 2015-07-13 (×13): qty 50

## 2015-07-13 MED ORDER — SODIUM CHLORIDE 0.9 % IR SOLN
Status: DC | PRN
  Administered 2015-07-13 (×2): 3000 mL

## 2015-07-13 MED ORDER — DOCUSATE SODIUM 100 MG PO CAPS
100.0000 mg | ORAL_CAPSULE | Freq: Two times a day (BID) | ORAL | Status: DC
  Administered 2015-07-13 – 2015-07-19 (×12): 100 mg via ORAL
  Filled 2015-07-13 (×12): qty 1

## 2015-07-13 MED ORDER — PHENYLEPHRINE HCL 10 MG/ML IJ SOLN
INTRAMUSCULAR | Status: DC | PRN
  Administered 2015-07-13: 120 ug via INTRAVENOUS
  Administered 2015-07-13: 160 ug via INTRAVENOUS
  Administered 2015-07-13: 120 ug via INTRAVENOUS

## 2015-07-13 MED ORDER — PROMETHAZINE HCL 25 MG/ML IJ SOLN: 6.2500 mg | INTRAMUSCULAR | Status: DC | PRN

## 2015-07-13 MED ORDER — ROCURONIUM BROMIDE 50 MG/5ML IV SOLN
INTRAVENOUS | Status: AC
  Filled 2015-07-13: qty 1

## 2015-07-13 MED ORDER — MORPHINE SULFATE (PF) 2 MG/ML IV SOLN
1.0000 mg | INTRAVENOUS | Status: DC | PRN
  Administered 2015-07-14: 1 mg via INTRAVENOUS
  Filled 2015-07-13: qty 1

## 2015-07-13 MED ORDER — LIDOCAINE 2% (20 MG/ML) 5 ML SYRINGE
INTRAMUSCULAR | Status: AC
Start: 2015-07-13 — End: 2015-07-13
  Filled 2015-07-13: qty 5

## 2015-07-13 MED ORDER — FUROSEMIDE 20 MG PO TABS: 20.0000 mg | ORAL_TABLET | ORAL | Status: DC | PRN

## 2015-07-13 MED ORDER — LACTATED RINGERS IV SOLN
INTRAVENOUS | Status: DC
  Administered 2015-07-13 (×2): via INTRAVENOUS

## 2015-07-13 MED ORDER — METHOCARBAMOL 500 MG PO TABS
500.0000 mg | ORAL_TABLET | Freq: Four times a day (QID) | ORAL | Status: DC | PRN
  Administered 2015-07-14 – 2015-07-19 (×8): 500 mg via ORAL
  Filled 2015-07-13 (×8): qty 1

## 2015-07-13 MED ORDER — PHENYLEPHRINE 40 MCG/ML (10ML) SYRINGE FOR IV PUSH (FOR BLOOD PRESSURE SUPPORT)
PREFILLED_SYRINGE | INTRAVENOUS | Status: AC
  Filled 2015-07-13: qty 10

## 2015-07-13 MED ORDER — LEVOTHYROXINE SODIUM 100 MCG PO TABS
100.0000 ug | ORAL_TABLET | Freq: Every day | ORAL | Status: DC
Start: 2015-07-14 — End: 2015-07-19
  Administered 2015-07-14 – 2015-07-19 (×6): 100 ug via ORAL
  Filled 2015-07-13 (×9): qty 1

## 2015-07-13 MED ORDER — AMLODIPINE BESYLATE 5 MG PO TABS
5.0000 mg | ORAL_TABLET | Freq: Every morning | ORAL | Status: DC
  Administered 2015-07-14 – 2015-07-19 (×5): 5 mg via ORAL
  Filled 2015-07-13 (×6): qty 1

## 2015-07-13 MED ORDER — METHOCARBAMOL 1000 MG/10ML IJ SOLN
500.0000 mg | Freq: Four times a day (QID) | INTRAMUSCULAR | Status: DC | PRN
  Filled 2015-07-13: qty 5

## 2015-07-13 MED ORDER — ONDANSETRON HCL 4 MG/2ML IJ SOLN: 4.0000 mg | Freq: Four times a day (QID) | INTRAMUSCULAR | Status: DC | PRN

## 2015-07-13 MED ORDER — MIDAZOLAM HCL 2 MG/2ML IJ SOLN
INTRAMUSCULAR | Status: AC
  Filled 2015-07-13: qty 2

## 2015-07-13 MED ORDER — ROSUVASTATIN CALCIUM 10 MG PO TABS
10.0000 mg | ORAL_TABLET | ORAL | Status: DC
Start: 2015-07-14 — End: 2015-07-19
  Administered 2015-07-14 – 2015-07-17 (×2): 10 mg via ORAL
  Filled 2015-07-13 (×2): qty 1

## 2015-07-13 SURGICAL SUPPLY — 111 items
BANDAGE ELASTIC 3 VELCRO ST LF (GAUZE/BANDAGES/DRESSINGS) IMPLANT
BANDAGE ELASTIC 4 VELCRO ST LF (GAUZE/BANDAGES/DRESSINGS) ×8 IMPLANT
BLADE LONG MED 31MMX9MM (MISCELLANEOUS)
BLADE LONG MED 31X9 (MISCELLANEOUS) IMPLANT
BLADE SURG 10 STRL SS (BLADE) ×6 IMPLANT
BLADE SURG 15 STRL LF DISP TIS (BLADE) ×2 IMPLANT
BLADE SURG 15 STRL SS (BLADE) ×6
BNDG CMPR 9X4 STRL LF SNTH (GAUZE/BANDAGES/DRESSINGS) ×1
BNDG COHESIVE 1X5 TAN STRL LF (GAUZE/BANDAGES/DRESSINGS) IMPLANT
BNDG ESMARK 4X9 LF (GAUZE/BANDAGES/DRESSINGS) ×3 IMPLANT
BNDG GAUZE ELAST 4 BULKY (GAUZE/BANDAGES/DRESSINGS) ×8 IMPLANT
BUR ROUND FLUTED 4 SOFT TCH (BURR) IMPLANT
BUR ROUND FLUTED 4MM SOFT TCH (BURR)
CAP PIN ORTHO PINK (CAP) IMPLANT
CAP PIN PROTECTOR ORTHO WHT (CAP) IMPLANT
CEMENT BONE W/GENTAMICIN 40/20 (Cement) ×4 IMPLANT
COMP HUMERAL RT 5X150MM 114917 (Head) ×1 IMPLANT
CORDS BIPOLAR (ELECTRODE) ×3 IMPLANT
COVER SURGICAL LIGHT HANDLE (MISCELLANEOUS) ×3 IMPLANT
CUFF TOURNIQUET SINGLE 18IN (TOURNIQUET CUFF) ×3 IMPLANT
CUFF TOURNIQUET SINGLE 24IN (TOURNIQUET CUFF) IMPLANT
DECANTER SPIKE VIAL GLASS SM (MISCELLANEOUS) ×3 IMPLANT
DRAPE INCISE IOBAN 66X45 STRL (DRAPES) ×3 IMPLANT
DRAPE OEC MINIVIEW 54X84 (DRAPES) IMPLANT
DRAPE SURG 17X23 STRL (DRAPES) ×3 IMPLANT
DRSG ADAPTIC 3X8 NADH LF (GAUZE/BANDAGES/DRESSINGS) ×9 IMPLANT
DRSG PAD ABDOMINAL 8X10 ST (GAUZE/BANDAGES/DRESSINGS) ×4 IMPLANT
ELBOW RT ULNA 4X115MM BOND (Elbow) ×2 IMPLANT
ELECT REM PT RETURN 9FT ADLT (ELECTROSURGICAL) ×3
ELECTRODE REM PT RTRN 9FT ADLT (ELECTROSURGICAL) ×1 IMPLANT
EVACUATOR 1/8 PVC DRAIN (DRAIN) IMPLANT
FLUID NSS /IRRIG 3000 ML XXX (IV SOLUTION) ×4 IMPLANT
GAUZE SPONGE 2X2 8PLY STRL LF (GAUZE/BANDAGES/DRESSINGS) IMPLANT
GAUZE SPONGE 4X4 12PLY STRL (GAUZE/BANDAGES/DRESSINGS) ×3 IMPLANT
GAUZE XEROFORM 1X8 LF (GAUZE/BANDAGES/DRESSINGS) IMPLANT
GAUZE XEROFORM 5X9 LF (GAUZE/BANDAGES/DRESSINGS) ×8 IMPLANT
GLOVE BIO SURGEON STRL SZ 6.5 (GLOVE) ×4 IMPLANT
GLOVE BIO SURGEONS STRL SZ 6.5 (GLOVE) ×4
GLOVE BIOGEL PI IND STRL 6.5 (GLOVE) IMPLANT
GLOVE BIOGEL PI INDICATOR 6.5 (GLOVE) ×2
GLOVE SS BIOGEL STRL SZ 8 (GLOVE) ×1 IMPLANT
GLOVE SUPERSENSE BIOGEL SZ 8 (GLOVE) ×2
GOWN STRL REUS W/ TWL LRG LVL3 (GOWN DISPOSABLE) ×2 IMPLANT
GOWN STRL REUS W/ TWL XL LVL3 (GOWN DISPOSABLE) ×3 IMPLANT
GOWN STRL REUS W/TWL LRG LVL3 (GOWN DISPOSABLE) ×6
GOWN STRL REUS W/TWL XL LVL3 (GOWN DISPOSABLE) ×3
HANDPIECE INTERPULSE COAX TIP (DISPOSABLE)
HUMERAL CON SET-HEXALOBULAR (Orthopedic Implant) ×3 IMPLANT
K-WIRE SMTH SNGL TROCAR .028X4 (WIRE)
K-WIRE.062 ×4 IMPLANT
KIT BASIN OR (CUSTOM PROCEDURE TRAY) ×3 IMPLANT
KIT DISC CONDYLE HEXALOBULAR (Orthopedic Implant) IMPLANT
KIT ROOM TURNOVER OR (KITS) ×3 IMPLANT
KIT TOTAL KNEE OPTIVAC 40/80GM (Knees) ×2 IMPLANT
KWIRE SMTH SNGL TROCAR .028X4 (WIRE) IMPLANT
LOOP VESSEL MAXI BLUE (MISCELLANEOUS) ×3 IMPLANT
MANIFOLD NEPTUNE II (INSTRUMENTS) ×3 IMPLANT
NDL HYPO 25GX1X1/2 BEV (NEEDLE) ×1 IMPLANT
NDL KEITH (NEEDLE) ×1 IMPLANT
NDL STRAIGHT KEITH (NEEDLE) ×1 IMPLANT
NEEDLE HYPO 25GX1X1/2 BEV (NEEDLE) ×3 IMPLANT
NEEDLE KEITH (NEEDLE) ×3 IMPLANT
NEEDLE STRAIGHT KEITH (NEEDLE) ×3 IMPLANT
NOZZLE CEMENT SMALL (Cement) ×3 IMPLANT
NOZZLE OPTIVAC SLIM 4154 (MISCELLANEOUS) ×1 IMPLANT
NS IRRIG 1000ML POUR BTL (IV SOLUTION) ×7 IMPLANT
PACK ORTHO EXTREMITY (CUSTOM PROCEDURE TRAY) ×3 IMPLANT
PAD ARMBOARD 7.5X6 YLW CONV (MISCELLANEOUS) ×6 IMPLANT
PAD CAST 4YDX4 CTTN HI CHSV (CAST SUPPLIES) ×2 IMPLANT
PADDING CAST ABS 4INX4YD NS (CAST SUPPLIES) ×4
PADDING CAST ABS COTTON 4X4 ST (CAST SUPPLIES) IMPLANT
PADDING CAST COTTON 4X4 STRL (CAST SUPPLIES) ×6
PASSER SUT SWANSON 36MM LOOP (INSTRUMENTS) ×3 IMPLANT
PLUG CEMENT CLEARCUT 10MM (Cement) ×2 IMPLANT
PUTTY DBM STAGRAFT PLUS 5CC (Putty) ×2 IMPLANT
SET HNDPC FAN SPRY TIP SCT (DISPOSABLE) IMPLANT
SOLUTION BETADINE 4OZ (MISCELLANEOUS) ×3 IMPLANT
SPONGE GAUZE 2X2 STER 10/PKG (GAUZE/BANDAGES/DRESSINGS)
SPONGE LAP 4X18 X RAY DECT (DISPOSABLE) ×3 IMPLANT
SPONGE SCRUB IODOPHOR (GAUZE/BANDAGES/DRESSINGS) ×3 IMPLANT
STAPLER VISISTAT 35W (STAPLE) IMPLANT
SUCTION FRAZIER HANDLE 10FR (MISCELLANEOUS) ×2
SUCTION TUBE FRAZIER 10FR DISP (MISCELLANEOUS) ×1 IMPLANT
SUT BONE WAX W31G (SUTURE) ×1 IMPLANT
SUT ETHIBOND NAB CT1 #1 30IN (SUTURE) IMPLANT
SUT ETHILON 4 0 FS 1 (SUTURE) ×3 IMPLANT
SUT FIBERWIRE #2 38 REV NDL BL (SUTURE) ×18
SUT FIBERWIRE #2 38 T-5 BLUE (SUTURE)
SUT FIBERWIRE 2-0 18 17.9 3/8 (SUTURE) ×3
SUT MERSILENE 4 0 P 3 (SUTURE) IMPLANT
SUT PROLENE 4 0 PS 2 18 (SUTURE) ×6 IMPLANT
SUT VIC AB 2-0 CT1 27 (SUTURE)
SUT VIC AB 2-0 CT1 TAPERPNT 27 (SUTURE) IMPLANT
SUT VIC AB 3-0 CT1 27 (SUTURE) ×6
SUT VIC AB 3-0 CT1 27XBRD (SUTURE) IMPLANT
SUT VIC AB 3-0 PS1 18 (SUTURE) ×3
SUT VIC AB 3-0 PS1 18XBRD (SUTURE) ×1 IMPLANT
SUT VICRYL 0 CT 1 36IN (SUTURE) ×3 IMPLANT
SUTURE FIBERWR #2 38 T-5 BLUE (SUTURE) IMPLANT
SUTURE FIBERWR 2-0 18 17.9 3/8 (SUTURE) IMPLANT
SUTURE FIBERWR#2 38 REV NDL BL (SUTURE) ×2 IMPLANT
SYR 3ML LL SCALE MARK (SYRINGE) ×3 IMPLANT
SYR CONTROL 10ML LL (SYRINGE) ×3 IMPLANT
TOWEL OR 17X24 6PK STRL BLUE (TOWEL DISPOSABLE) ×3 IMPLANT
TOWEL OR 17X26 10 PK STRL BLUE (TOWEL DISPOSABLE) ×6 IMPLANT
TOWER CARTRIDGE SMART MIX (DISPOSABLE) ×3 IMPLANT
TRAY FOLEY CATH 16FRSI W/METER (SET/KITS/TRAYS/PACK) ×2 IMPLANT
TUBE CONNECTING 12'X1/4 (SUCTIONS) ×1
TUBE CONNECTING 12X1/4 (SUCTIONS) ×2 IMPLANT
UNDERPAD 30X30 INCONTINENT (UNDERPADS AND DIAPERS) ×3 IMPLANT
WATER STERILE IRR 1000ML POUR (IV SOLUTION) ×3 IMPLANT

## 2015-07-13 NOTE — Op Note (Signed)
See [id number] Amedeo Plenty MD

## 2015-07-13 NOTE — Op Note (Signed)
Jocelyn Bauer NO.:  [id number]  MEDICAL RECORD NO.:  [id number]  LOCATION:  5N10C                        FACILITY:  [facility]  PHYSICIAN:  Satira Anis. 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  PROCEDURES: 1. Removal of deep implant, right elbow in the form of a loose total     elbow arthroplasty with cement extraction, synovectomy and repair     of soft tissue parameters. 2. Ulnar nerve neurolysis, extensive in nature with history of prior     scar tissue formation. 3. Revision total elbow arthroplasty, right arm with a discovery elbow     utilizing a size 4 long stem ulna component and a size 5 long stem     humeral component, both cemented with excellent fixation. 4. Complex triceps repair, reconstruction.  SURGEON:  Satira Anis. Darica Goren, M.D.  ASSISTANTMinerva Ends, surgical scrub assist.  TOURNIQUET TIME:  Less than 2 hours followed by deflation time followed by brief insufflation time of less than an hour.  INDICATIONS:  Jocelyn Bauer is a very pleasant female, presents with the above-mentioned diagnosis.  I have counseled her in regard to risks and benefits and she desires to proceed.  All questions have been encouraged and answered preoperatively.  OPERATIVE PROCEDURE:  The patient was seen by myself and Anesthesia, taken to the operative theater and underwent smooth induction of general anesthetic.  Preoperatively, a block was placed.  She was given Ancef preoperatively.  The patient underwent a modified sloppy lateral positioning supervised by myself.  I then personally performed a pre- scrub with  Hibiclens followed by a 10-minute surgical Betadine scrub. Outline marks were made.  Charlie Pitter was placed about the arm and all areas were secured.  I inflated a sterile tourniquet and made a posterior incision utilitarian in nature.  Dissection was carried down, interval between the subcu and the fascia was created nicely.  The triceps was intact and had a large amount of cement embedded into it.  I very carefully performed an ulnar nerve evaluation.  The ulnar nerve was evaluated and underwent a neurolysis.  I removed it from thick imbedded scar tissue and did not have to move it extensively out of its bed. This was an ulnar nerve neurolysis, extensive in nature with facial nerve dissector used for this purpose.  I did not disrupt any fascicles or injure of the nerve.  The nerve was kept intact and in mind at all times during the operation.  Following this, the patient underwent, I turned down of the triceps. There was large amount of synovitis in the elbow joint.  No infectious- type fluid.  I very carefully and cautiously performed a synovectomy.  I turned down the triceps.  Interestingly, the triceps had particulate matter embedded in it.  Nevertheless, I was very careful tying it down and kept a good sleeve of triceps.  I  then entered the elbow joint, performed an arthrotomy and synovectomy.  I performed a very aggressive synovectomy and removal of any nonviable tissue.  The implant was removed without difficulty, it slid out nicely.  I then set out to remove cement.  Cement was removed from the humerus and ulna.  Combination of guidepins, reamers, fluoro and other measures were used to remove the cement to allow for reinsertion of deeper and longer implants.  I was able to size to a discovery elbow size 4 ulna component, long stem in nature and a size 5 humeral component, long stem in nature.  I irrigated copiously with multiple rounds of saline followed by placement of a cement  restrictor in the humerus.  I actually got the final implants out and trialed them into the bone to make sure that the fit was good and it was.  I was pleased with this and the findings.  Following this, I deflated the tourniquet and irrigated copiously.  Once this was complete, all looked quite well and I then performed placement of cement in the humerus and in the ulna followed by placement of the components and coapted the ulna and humerus together.  Cement was allowed to cure.  There were no complicating features.  I did place a small amount of StaGraft bone graft in the proximal area.  Prior to final implantation, I made drill holes for triceps reconstruction and reinsertion.  Drill holes were placed and shoulders were placed.  There were no complicating features.  Following this, the patient then underwent a very careful and cautious modified Krackow stitch in the triceps.  I should note that the patient had a small area of fat necrosis and this was concerning as it was next a piece of cement.  Thus, I then performed culture, aerobic and anaerobic, etc. Following this, we then performed very careful and cautious irrigation. The patient's triceps were concerned as I did not see any form of a milky fluid throughout the entire case, but at the very end of the case, there was a small area where there was a piece of cement and a piece of inflammatory change with some unusual-looking fluid.  Thus, as mentioned, this was cultured.  At this time, I made sure it did not touch anything and placed 6 liters through and through the elbow joint. Following this, we then repaired the triceps with combination of bony drill holes and suture-shuttling technique.  This closed beautifully, all areas looked excellent.  The tourniquet was reinflated for brief period of time during the cementing process.  The patient had excellent pulse, soft compartments, skin looked excellent of good quality and no  complicating features.  AP, lateral, and oblique x-rays were performed, examined and interpreted by myself. At multiple points during the operation and with final components and looked excellent, I was quite pleased with this and the findings.  The subcu was closed and this was performed with Vicryl suture.  The skin edge closed with Prolene over drain.  The patient tolerated this well.  She was placed in a long-arm splint.  Anterior and posterior slabs were placed without difficulty.  This was very well padded.  There were no complicating features.  I was quite pleased with this and the findings.  Thus, the patient underwent ulnar nerve neurolysis, hardware removal with synovectomy and arthrotomy, triceps reconstruction and revision total elbow arthroplasty with discovery elbow long stem components.  The patient tolerated this well.  There were no complicating features.  All sponge, needles and instrument counts were reported as correct.  We will monitor her condition closely.  I am going to ask the hospitalist to see her given her age and other issues.  I will want to keep a close eye on her cultures to make sure that there was no complicating feature.  I am going to place her presumptively on vanc and Fortaz to be on the safe side.  She has normal kidney parameters and this should not pose a problem.  We will keep a very close eye on her pain management as she does have chronic pain management needs.  I have discussed all issues with the family findings and gave them a copy of the x-rays.     Satira Anis. Amedeo Plenty, M.D.     [facility]  D:  07/13/2015  T:  07/13/2015  Job:  [id number]

## 2015-07-13 NOTE — Transfer of Care (Signed)
Immediate Anesthesia Transfer of Care Note  Patient: Jocelyn Bauer  Procedure(s) Performed: Procedure(s): RIGHT ELBOW REVISION TOTAL ELBOW ARTHROPLASTY  (Right)  WITH HARDWARE REMOVAL REPAIR AS NECESSARY (Right)  TRICEP RECONSTRUCTION REPAIR WITH ALLOGRAFT, ULNAR NERVE NEUROLYSIS (Right)  Patient Location: PACU  Anesthesia Type:General and GA combined with regional for post-op pain  Level of Consciousness: awake, alert  and oriented  Airway & Oxygen Therapy: Patient Spontanous Breathing and Patient connected to nasal cannula oxygen  Post-op Assessment: Report given to RN and Post -op Vital signs reviewed and stable  Post vital signs: Reviewed and stable  Last Vitals:  Filed Vitals:   07/13/15 1228 07/13/15 1230  BP:  184/58  Pulse: 89 94  Temp:    Resp:      Last Pain: There were no vitals filed for this visit.       Complications: No apparent anesthesia complications

## 2015-07-13 NOTE — Progress Notes (Signed)
Pharmacy Antibiotic Note  Jocelyn Bauer is a 82 y.o. female admitted on 07/13/2015 with empiric treatment.  Pharmacy has been consulted for Vancomycin and Zosyn  dosing.  Plan: Zosyn 3.375 grams iv Q 8 hours x 3 doses Vancomycin 750 mg iv Q 24 hours x 2 doses Have limited doses to 24 hours, please re-consult pharmacy if further doses needed.   Do broad spectrum antibiotics need to continue for elbow arthroplasty?    Height: 5\' 2"  (157.5 cm) Weight: 161 lb 4.8 oz (73.165 kg) IBW/kg (Calculated) : 50.1  Temp (24hrs), Avg:98.3 F (36.8 C), Min:98.2 F (36.8 C), Max:98.4 F (36.9 C)  No results for input(s): WBC, CREATININE, LATICACIDVEN, VANCOTROUGH, VANCOPEAK, VANCORANDOM, GENTTROUGH, GENTPEAK, GENTRANDOM, TOBRATROUGH, TOBRAPEAK, TOBRARND, AMIKACINPEAK, AMIKACINTROU, AMIKACIN in the last 168 hours.  Estimated Creatinine Clearance: 48.1 mL/min (by C-G formula based on Cr of 0.69).    Allergies  Allergen Reactions  . Contrast Media [Iodinated Diagnostic Agents] Other (See Comments)    Itching.( 13 hr pre medication regimen worked fine. )  . Tdap [Diphth-Acell Pertussis-Tetanus] Hives     Thank you for allowing pharmacy to be a part of this patient's care.  Tad Moore 07/13/2015 8:12 PM

## 2015-07-13 NOTE — Anesthesia Preprocedure Evaluation (Addendum)
Anesthesia Evaluation  Patient identified by MRN, date of birth, ID band Patient awake    Reviewed: Allergy & Precautions, H&P , NPO status , Patient's Chart, lab work & pertinent test results  Airway Mallampati: II  TM Distance: >3 FB Neck ROM: Full    Dental  (+) Edentulous Lower, Edentulous Upper   Pulmonary shortness of breath, former smoker,    Pulmonary exam normal breath sounds clear to auscultation       Cardiovascular hypertension, Pt. on medications Normal cardiovascular exam Rhythm:Regular Rate:Normal     Neuro/Psych  Headaches, Anxiety    GI/Hepatic Neg liver ROS, GERD  ,  Endo/Other  Hypothyroidism   Renal/GU negative Renal ROS  negative genitourinary   Musculoskeletal  (+) Arthritis ,   Abdominal   Peds negative pediatric ROS (+)  Hematology negative hematology ROS (+)   Anesthesia Other Findings Chronic pain  Reproductive/Obstetrics negative OB ROS                           Anesthesia Physical  Anesthesia Plan  ASA: III  Anesthesia Plan: Regional and General   Post-op Pain Management: GA combined w/ Regional for post-op pain   Induction: Intravenous  Airway Management Planned: LMA  Additional Equipment:   Intra-op Plan:   Post-operative Plan: Extubation in OR  Informed Consent: I have reviewed the patients History and Physical, chart, labs and discussed the procedure including the risks, benefits and alternatives for the proposed anesthesia with the patient or authorized representative who has indicated his/her understanding and acceptance.   Dental advisory given  Plan Discussed with: CRNA  Anesthesia Plan Comments:        Anesthesia Quick Evaluation

## 2015-07-13 NOTE — Anesthesia Procedure Notes (Addendum)
Anesthesia Regional Block:  Supraclavicular block  Pre-Anesthetic Checklist: ,, timeout performed, Correct Patient, Correct Site, Correct Laterality, Correct Procedure, Correct Position, site marked, Risks and benefits discussed,  Surgical consent,  Pre-op evaluation,  At surgeon's request and post-op pain management  Laterality: Right  Prep: chloraprep       Needles:  Injection technique: Single-shot  Needle Type: Stimiplex     Needle Length: 9cm 9 cm Needle Gauge: 21 and 21 G    Additional Needles:  Procedures: ultrasound guided (picture in chart) Supraclavicular block Narrative:  Injection made incrementally with aspirations every 5 mL.  Performed by: Personally  Anesthesiologist: JUDD, BENJAMIN  Additional Notes: Risks, benefits and alternative to block explained extensively.  Patient tolerated procedure well, without complications.   Procedure Name: Intubation Date/Time: 07/13/2015 1:38 PM Performed by: Collier Bullock Pre-anesthesia Checklist: Patient identified, Emergency Drugs available, Suction available and Patient being monitored Patient Re-evaluated:Patient Re-evaluated prior to inductionOxygen Delivery Method: Circle system utilized Preoxygenation: Pre-oxygenation with 100% oxygen Intubation Type: IV induction Ventilation: Oral airway inserted - appropriate to patient size Laryngoscope Size: Mac and 3 Grade View: Grade I Tube type: Oral Tube size: 7.0 mm Number of attempts: 1 Airway Equipment and Method: Stylet Secured at: 22 cm Tube secured with: Tape Dental Injury: Teeth and Oropharynx as per pre-operative assessment

## 2015-07-13 NOTE — Consult Note (Signed)
Medical Consultation   Jocelyn Bauer  [id number]  DOB: April 29, 1928  DOA: 07/13/2015  PCP: Jerlyn Ly, MD   Requesting physician: Ortho, Dr. Amedeo Plenty  Reason for consultation: to help in managing chronic issues, including hypertension, hypothyroidism, depression, rheumatoid arthritis   History of Present Illness:  Jocelyn Bauer is an 82 y.o. female with a past medical history of hypertension, hyperlipidemia, GERD, hypothyroidism, depression, anxiety, chronic neck pain, back pain, rheumatoid arthritis, chronic right elbow pain, who is admitted by ortho for right elbow surgery.  She's had prior elbow replacement in the 90s and is gone on to failure, causing right elbow pain. It is constant, severe and chronic pain. Pt is admitted today by ortho for revision arthroplasty right elbow with repair reconstruction. She is s/p of surgery by Dr. Amedeo Plenty. Due to multifocal recurrent medical issues, we are asked to help managing chronic medical problems.  When I saw pt on the floor, she s/p of surgery. Her right arm and elbow are wrapped up. I did not open it. Pt states that her pain is mild with pain medications. She does not have numbness in right arm. She denies fever, chills, chest pain, shortness breath, cough, nausea, vomiting, diarrhea, symptoms of UTI or unilateral weakness. She states that she has a chronic back pain, which is moderate, nonradiating. No significant change.  Pt was found to have WBC 10.7, temperature normal, normal blood pressure, electrolytes and renal function okay.  Review of Systems:  ROS  General: no fevers, chills, no changes in body weight, has fatigue HEENT: no blurry vision, hearing changes or sore throat Pulm: no dyspnea, coughing, wheezing CV: no chest pain, no palpitations Abd: no nausea, vomiting, abdominal pain, diarrhea, constipation GU: no dysuria, burning on urination, increased urinary frequency, hematuria  Ext: no leg edema Neuro: no  unilateral weakness, numbness, or tingling, no vision change or hearing loss Skin: no rash MSK: has lower back pain and right elbow pain Heme: No easy bruising.  Travel history: No recent long distant travel.  Past Medical History: Past Medical History  Diagnosis Date  . Hypertension   . Shortness of breath     occas  . Arthritis     RA  . Hypothyroidism   . Hyperlipidemia   . GERD (gastroesophageal reflux disease)   . Anxiety     HX OF PANIC ATTACKS  . Osteoporosis   . Headache   . Chronic pain   . Chronic neck and back pain   . Osteoarthritis   . UTI (urinary tract infection)   . Elbow locking     due to RA- elbow with limited extension    Past Surgical History: Past Surgical History  Procedure Laterality Date  . Joint replacement  1998    left hip replacement and bilateral elbow replacements  about 15 yrs ago  . Thyroidectomy    . Total hip revision  03/11/2011    Procedure: TOTAL HIP REVISION;  Surgeon: [name], MD;  Location: [facility];  Service: Orthopedics;  Laterality: Left;  . Total hip arthroplasty    . Elbow surgery Right   . Elbow surgery Left   . Cataract extraction Bilateral   . Colonoscopy    . Esophagogastroduodenoscopy     Allergies:   Allergies  Allergen Reactions  . Contrast Media [Iodinated Diagnostic Agents] Other (See Comments)    Itching.( 13 hr pre medication regimen worked fine. )  .  Tdap [Diphth-Acell Pertussis-Tetanus] Hives    Social History:  reports that she has quit smoking. She has never used smokeless tobacco. She reports that she does not drink alcohol or use illicit drugs.   Family History: Family History  Problem Relation Age of Onset  . Heart attack Mother   . Prostate cancer Father   . Lung disease Brother   . Heart attack Sister   . COPD Sister   . Cancer Sister     She does not know which type of cancer   Physical Exam: Filed Vitals:   07/13/15 1900 07/13/15 1915 07/13/15 1941 07/14/15 0111  BP: 120/52  107/42 108/41 128/52  Pulse: 65 66 65 80  Temp:  98.2 F (36.8 C) 98.3 F (36.8 C) 99.4 F (37.4 C)  TempSrc:   Oral Oral  Resp: 19 11 14 14   Height:      Weight:      SpO2: 97% 98% 97% 98%    Constitutional: no acute distress Alert and awake. Eyes: PERLA, EOMI, irises appear normal, anicteric sclera,  ENMT: external ears and nose appear normal, normal hearing or hard of hearing            Lips appears normal, oropharynx mucosa, tongue, posterior pharynx appear normal  Neck: neck appears normal, no masses, normal ROM, no thyromegaly, no JVD  CVS: S1-S2 clear, no murmur rubs or gallops, no LE edema, normal pedal pulses  Respiratory:  clear to auscultation bilaterally, no wheezing, rales or rhonchi. Respiratory effort normal. No accessory muscle use.  Abdomen: soft nontender, nondistended, normal bowel sounds, no hepatosplenomegaly, no hernias  Musculoskeletal:  Her right arm and elbow are wrapped up. Has tenderness over right elbow.  Neuro: Alert, oriented 3, Cranial nerves II-XII intact, moves all extremities.  Psych: judgement and insight appear normal, stable mood and affect, mental status Skin: no rashes or lesions or ulcers, no induration or nodules   Data reviewed:  I have personally reviewed following labs and imaging studies Labs:  CBC:  Recent Labs Lab 07/13/15 2232  WBC 10.7*  HGB 10.3*  HCT 33.7*  MCV 90.8  PLT 123456    Basic Metabolic Panel:  Recent Labs Lab 07/13/15 2232  NA 137  K 4.2  CL 103  CO2 27  GLUCOSE 117*  BUN 8  CREATININE 0.70  CALCIUM 8.3*   GFR Estimated Creatinine Clearance: 48.1 mL/min (by C-G formula based on Cr of 0.7). Liver Function Tests: No results for input(s): AST, ALT, ALKPHOS, BILITOT, PROT, ALBUMIN in the last 168 hours. No results for input(s): LIPASE, AMYLASE in the last 168 hours. No results for input(s): AMMONIA in the last 168 hours. Coagulation profile No results for input(s): INR, PROTIME in the last 168  hours.  Cardiac Enzymes: No results for input(s): CKTOTAL, CKMB, CKMBINDEX, TROPONINI in the last 168 hours. BNP: Invalid input(s): POCBNP CBG: No results for input(s): GLUCAP in the last 168 hours. D-Dimer No results for input(s): DDIMER in the last 72 hours. Hgb A1c No results for input(s): HGBA1C in the last 72 hours. Lipid Profile No results for input(s): CHOL, HDL, LDLCALC, TRIG, CHOLHDL, LDLDIRECT in the last 72 hours. Thyroid function studies No results for input(s): TSH, T4TOTAL, T3FREE, THYROIDAB in the last 72 hours.  Invalid input(s): FREET3 Anemia work up No results for input(s): VITAMINB12, FOLATE, FERRITIN, TIBC, IRON, RETICCTPCT in the last 72 hours. Urinalysis    Component Value Date/Time   COLORURINE YELLOW 10/31/2014 [place] 10/31/2014 1240  LABSPEC 1.012 10/31/2014 1240   PHURINE 7.5 10/31/2014 1240   GLUCOSEU NEGATIVE 10/31/2014 1240   HGBUR SMALL* 10/31/2014 [place] 10/31/2014 1240   KETONESUR NEGATIVE 10/31/2014 1240   PROTEINUR NEGATIVE 10/31/2014 1240   UROBILINOGEN 0.2 10/31/2014 1240   NITRITE NEGATIVE 10/31/2014 1240   LEUKOCYTESUR NEGATIVE 10/31/2014 1240     Microbiology Recent Results (from the past 240 hour(s))  Surgical pcr screen     Status: Abnormal   Collection Time: 07/06/15 12:28 PM  Result Value Ref Range Status   MRSA, PCR POSITIVE (A) NEGATIVE Final    Comment: RESULT CALLED TO, READ BACK BY AND VERIFIED WITH: Izell [place] RN 14:55 07/06/15 ([email or web address])    Staphylococcus aureus POSITIVE (A) NEGATIVE Final    Comment:        The Xpert SA Assay (FDA approved for NASAL specimens in patients over 68 years of age), is one component of a comprehensive surveillance program.  Test performance has been validated by [facility] for patients greater than or equal to 11 year old. It is not intended to diagnose infection nor to guide or monitor treatment.   Body fluid culture     Status: None  (Preliminary result)   Collection Time: 07/13/15  4:41 PM  Result Value Ref Range Status   Specimen Description SYNOVIAL RIGHT ELBOW  Final   Special Requests FLUID ON SWAB  Final   Gram Stain   Final    FEW WBC PRESENT, PREDOMINANTLY MONONUCLEAR NO ORGANISMS SEEN    Culture PENDING  Incomplete   Report Status PENDING  Incomplete  Aerobic/Anaerobic Culture (surgical/deep wound)     Status: None (Preliminary result)   Collection Time: 07/13/15  4:41 PM  Result Value Ref Range Status   Specimen Description TISSUE  Final   Special Requests RIGHT TRICEPS TENDON  Final   Gram Stain   Final    MODERATE WBC PRESENT, PREDOMINANTLY MONONUCLEAR NO ORGANISMS SEEN    Culture PENDING  Incomplete   Report Status PENDING  Incomplete       Inpatient Medications:   Scheduled Meds: . amLODipine  5 mg Oral q morning - 10a  . benazepril  20 mg Oral q morning - 10a  . cholecalciferol  2,000 Units Oral Daily  . docusate sodium  100 mg Oral BID  . [START ON 07/15/2015] fentaNYL  50 mcg Transdermal Q72H  . fluticasone  2 spray Each Nare Daily  . folic acid  1 mg Oral Daily  . levothyroxine  100 mcg Oral QAC breakfast  . omega-3 acid ethyl esters  1 g Oral Daily  . pantoprazole  40 mg Oral Daily  . piperacillin-tazobactam (ZOSYN)  IV  3.375 g Intravenous Q8H  . predniSONE  5 mg Oral Daily  . rosuvastatin  10 mg Oral Q72H  . sertraline  50 mg Oral Daily  . vancomycin  750 mg Intravenous Q24H  . vitamin C  1,000 mg Oral Daily   Continuous Infusions: . lactated ringers 50 mL/hr at 07/13/15 1135  . lactated ringers       Radiological Exams on Admission: No results found.  Impression/Recommendations  Right elbow pain: pt is s/p of revision arthroplasty right elbow with repair reconstruction. No neurovascular compromise. Pain is well controlled. -managed per ortho as primary team -pain control: Fentanyl patch and oxycodone per ortho -When necessary Robaxin -will add prn Senokot -On IV  vancomycin and Zosyn per orhto  HTN: Blood pressure 97/70 -continue home amlodipine, Lotensin -also  on lasix prn for fluid retention  Hypothyroidism: Last TSH was not on record -Continue home Synthroid, 100 g daily  HLD: Last LDL was 109 on 01/15/07 -Continue home medications: Crestor and fish oil -Check FLP  GERD: -Protonix  Depression and anxiety: Stable, no suicidal or homicidal ideations. -Continue home medications: Zoloft, Ativan when necessary  Chronic neck and back pain: -When necessary oxycodone -prn Robaxin  RA: stable.  -continue methotrexate and prednisone 5mg  daily -give one dose of Solu-Cortef, 50 mg 1 as stress dose -Check cortisol level   Thank you for this consultation.  Our [facility] hospitalist team will follow the patient with you.   Time Spent: 35 min  Ivor Costa M.D. [facility] 07/14/2015, 3:03 AM

## 2015-07-13 NOTE — H&P (Signed)
Jocelyn Bauer is an 82 y.o. female.   Chief Complaint: Right elbow pain HPI: Patient presents for revision arthroplasty right elbow with repair reconstruction is necessary. She's had prior elbow replacement in the 90s and is gone on to failure. We're planning to proceed with reconstruction utilizing revision components and our graft as necessary. I discussed her risk and benefits etc. as preoperative notes detail at length. She denies neck back chest or abdominal pain at present time.  Past Medical History  Diagnosis Date  . Hypertension   . Shortness of breath     occas  . Arthritis     RA  . Hypothyroidism   . Hyperlipidemia   . GERD (gastroesophageal reflux disease)   . Anxiety     HX OF PANIC ATTACKS  . Osteoporosis   . Headache   . Chronic pain   . Chronic neck and back pain   . Osteoarthritis   . UTI (urinary tract infection)   . Elbow locking     due to RA- elbow with limited extension    Past Surgical History  Procedure Laterality Date  . Joint replacement  1998    left hip replacement and bilateral elbow replacements  about 15 yrs ago  . Thyroidectomy    . Total hip revision  03/11/2011    Procedure: TOTAL HIP REVISION;  Surgeon: [name], MD;  Location: [facility];  Service: Orthopedics;  Laterality: Left;  . Total hip arthroplasty    . Elbow surgery Right   . Elbow surgery Left   . Cataract extraction Bilateral   . Colonoscopy    . Esophagogastroduodenoscopy      History reviewed. No pertinent family history. Social History:  reports that she has quit smoking. She has never used smokeless tobacco. She reports that she does not drink alcohol or use illicit drugs.  Allergies:  Allergies  Allergen Reactions  . Contrast Media [Iodinated Diagnostic Agents] Other (See Comments)    Itching.( 13 hr pre medication regimen worked fine. )  . Tdap [Diphth-Acell Pertussis-Tetanus] Hives    Medications Prior to Admission  Medication Sig Dispense Refill  .  amLODipine (NORVASC) 5 MG tablet Take 5 mg by mouth every morning.    Marland Kitchen ARTIFICIAL TEAR OP Apply 1 drop to eye 2 (two) times daily as needed (DRY EYES).    Marland Kitchen benazepril (LOTENSIN) 20 MG tablet Take 20 mg by mouth every morning.    . cholecalciferol (VITAMIN D) 1000 UNITS tablet Take 2,000 Units by mouth daily.    Marland Kitchen denosumab (PROLIA) 60 MG/ML SOLN injection Inject 60 mg into the skin every 6 (six) months. Administer in upper arm, thigh, or abdomen    . fentaNYL (DURAGESIC - DOSED MCG/HR) 50 MCG/HR Place 50 mcg onto the skin every 3 (three) days.    . fexofenadine (ALLEGRA) 180 MG tablet Take 180 mg by mouth daily as needed (For allergies.).     Marland Kitchen fluticasone (FLONASE) 50 MCG/ACT nasal spray Place 2 sprays into the nose daily.     . folic acid (FOLVITE) 1 MG tablet Take 1 mg by mouth daily.    . furosemide (LASIX) 20 MG tablet Take 20 mg by mouth every other day as needed for fluid (For ankle swelling).     Marland Kitchen levothyroxine (SYNTHROID, LEVOTHROID) 100 MCG tablet Take 100 mcg by mouth daily before breakfast.    . LORazepam (ATIVAN) 0.5 MG tablet Take 0.25-5 mg by mouth 2 (two) times daily as needed for anxiety.    Marland Kitchen  methotrexate (RHEUMATREX) 2.5 MG tablet Take 22.5 mg by mouth once a week. Caution:Chemotherapy. Protect from light.  mondays    . Omega-3 Fatty Acids (FISH OIL) 1000 MG CAPS Take 1 capsule by mouth daily.    Marland Kitchen omeprazole (PRILOSEC) 20 MG capsule Take 20 mg by mouth every other day.     . oxyCODONE-acetaminophen (PERCOCET) 10-325 MG tablet Take 1 tablet by mouth every 8 (eight) hours as needed for pain. Pain    . oxyCODONE-acetaminophen (PERCOCET) 7.5-325 MG tablet Take 1 tablet by mouth every 6 (six) hours as needed for severe pain.    . predniSONE (DELTASONE) 5 MG tablet Take 1 tablet (5 mg total) by mouth daily. 15 tablet 0  . rosuvastatin (CRESTOR) 20 MG tablet Take 10 mg by mouth every 3 (three) days.     Marland Kitchen sertraline (ZOLOFT) 50 MG tablet Take 50 mg by mouth daily.       No  results found for this or any previous visit (from the past 48 hour(s)). No results found.  Review of Systems  Respiratory: Negative.   Gastrointestinal: Negative.   Genitourinary: Negative.   Neurological: Negative.     Blood pressure 165/59, pulse 78, temperature 98.3 F (36.8 C), temperature source Oral, resp. rate 18, height 5\' 2"  (1.575 m), weight 73.165 kg (161 lb 4.8 oz), SpO2 96 %. Physical Exam  Loosening right total elbow arthroplasty. It is difficult to evaluate the triceps for competency given the severe loss of bony structure.  She has positive pulses she is sensate. She has limited motion in her fingers secondary to advanced rheumatoid disease.  I reviewed her examination at great length..  Chest is clear.  Abdomen is nontender nondistended soft. Lower extremity examination is stable. Assessment/Plan Will plan for revision total elbow arthroplasty right upper extremity with allograft reconstruction and repair is necessary utilizing revision components. I discussed with the patient all issues plans concerns and other issues.  We are planning surgery for your upper extremity. The risk and benefits of surgery to include risk of bleeding, infection, anesthesia,  damage to normal structures and failure of the surgery to accomplish its intended goals of relieving symptoms and restoring function have been discussed in detail. With this in mind we plan to proceed. I have specifically discussed with the patient the pre-and postoperative regime and the dos and don'ts and risk and benefits in great detail. Risk and benefits of surgery also include risk of dystrophy(CRPS), chronic nerve pain, failure of the healing process to go onto completion and other inherent risks of surgery The relavent the pathophysiology of the disease/injury process, as well as the alternatives for treatment and postoperative course of action has been discussed in great detail with the patient who desires to  proceed.  We will do everything in our power to help you (the patient) restore function to the upper extremity. It is a pleasure to see this patient today.   Paulene Floor, MD 07/13/2015, 12:26 PM

## 2015-07-14 ENCOUNTER — Encounter (HOSPITAL_COMMUNITY): Payer: Self-pay | Admitting: Internal Medicine

## 2015-07-14 DIAGNOSIS — M25521 Pain in right elbow: Secondary | ICD-10-CM | POA: Diagnosis present

## 2015-07-14 DIAGNOSIS — I10 Essential (primary) hypertension: Secondary | ICD-10-CM

## 2015-07-14 LAB — CORTISOL-AM, BLOOD: Cortisol - AM: 12.3 ug/dL (ref 6.7–22.6)

## 2015-07-14 MED ORDER — SENNA 8.6 MG PO TABS
1.0000 | ORAL_TABLET | Freq: Every day | ORAL | Status: DC | PRN
  Administered 2015-07-16: 8.6 mg via ORAL
  Filled 2015-07-14: qty 1

## 2015-07-14 MED ORDER — MUPIROCIN 2 % EX OINT
1.0000 "application " | TOPICAL_OINTMENT | Freq: Two times a day (BID) | CUTANEOUS | Status: DC
  Administered 2015-07-15 – 2015-07-19 (×9): 1 via NASAL
  Filled 2015-07-14: qty 22

## 2015-07-14 MED ORDER — CHLORHEXIDINE GLUCONATE CLOTH 2 % EX PADS
6.0000 | MEDICATED_PAD | Freq: Every day | CUTANEOUS | Status: AC
  Administered 2015-07-15 – 2015-07-19 (×5): 6 via TOPICAL

## 2015-07-14 NOTE — Evaluation (Signed)
Physical Therapy Evaluation Patient Details Name: Jocelyn Bauer MRN: [id number] DOB: 1928-05-10 Today's Date: 07/14/2015   History of Present Illness  82 y.o. female now s/p revision arthroplasty right elbow. Original replacement performed in the 1990s. PMH: hypertension, Rheumatoid arthritis, anxiety, chronic pain, Lt THA.    Clinical Impression  Patient is s/p above surgery resulting in functional limitations due to the deficits listed below (see PT Problem List).  Patient will benefit from skilled PT to increase their independence and safety with mobility to allow discharge to home with family support.      Follow Up Recommendations No PT follow up;Supervision for mobility/OOB    Equipment Recommendations  None recommended by PT    Recommendations for Other Services       Precautions / Restrictions Precautions Precautions: Fall Required Braces or Orthoses: Sling (mission sling per comfort) Restrictions Weight Bearing Restrictions: Yes RUE Weight Bearing: Non weight bearing      Mobility  Bed Mobility               General bed mobility comments: pt sitting EOB upon arrival  Transfers Overall transfer level: Needs assistance Equipment used: None Transfers: Sit to/from Stand Sit to Stand: Min guard         General transfer comment: guard for safety.  Ambulation/Gait Ambulation/Gait assistance: Min guard Ambulation Distance (Feet): 12 Feet Assistive device: None (pt declines use of SPC. ) Gait Pattern/deviations: Shuffle;Wide base of support Gait velocity: decreased   General Gait Details: Slow pattern with mild instability but no loss of balance. Pt reports that this is her baseline for ambulation.   Stairs            Wheelchair Mobility    Modified Rankin (Stroke Patients Only)       Balance Overall balance assessment: Needs assistance Sitting-balance support: No upper extremity supported Sitting balance-Leahy Scale: Good     Standing  balance support: No upper extremity supported Standing balance-Leahy Scale: Fair                               Pertinent Vitals/Pain Pain Assessment: 0-10 Pain Score: 7  Pain Location: Rt arm/elbow Pain Descriptors / Indicators: Throbbing Pain Intervention(s): Monitored during session;Limited activity within patient's tolerance    Home Living Family/patient expects to be discharged to:: Private residence Living Arrangements: Children (daughter lives with pt) Available Help at Discharge: Family;Available PRN/intermittently Type of Home: House Home Access: Level entry     Home Layout: One level Home Equipment: Walker - 2 wheels;Walker - standard;Bedside commode;Shower seat Additional Comments: daughter works but will be off this weekend. Has other children in the area who can help as needed also. Pt states that she watches TV most of the day.     Prior Function Level of Independence: Independent         Comments: daughter helped with housework (cooking/cleaning)     Hand Dominance   Dominant Hand: Right    Extremity/Trunk Assessment   Upper Extremity Assessment: Defer to OT evaluation           Lower Extremity Assessment: Generalized weakness      Cervical / Trunk Assessment: Kyphotic  Communication   Communication: No difficulties  Cognition Arousal/Alertness: Awake/alert Behavior During Therapy: WFL for tasks assessed/performed Overall Cognitive Status: Within Functional Limits for tasks assessed  General Comments      Exercises        Assessment/Plan    PT Assessment Patient needs continued PT services  PT Diagnosis Difficulty walking   PT Problem List Decreased strength;Decreased activity tolerance;Decreased balance;Decreased mobility  PT Treatment Interventions DME instruction;Gait training;Functional mobility training;Therapeutic activities;Therapeutic exercise;Balance training;Patient/family education    PT Goals (Current goals can be found in the Care Plan section) Acute Rehab PT Goals Patient Stated Goal: go home PT Goal Formulation: With patient Time For Goal Achievement: 07/28/15 Potential to Achieve Goals: Good    Frequency Min 3X/week   Barriers to discharge        Co-evaluation               End of Session Equipment Utilized During Treatment: Other (comment) (pt refusing use of gait belt. ) Activity Tolerance: Patient tolerated treatment well Patient left: in chair;with call bell/phone within reach (Rt UE elevated above heart) Nurse Communication: Mobility status         Time: [id number] PT Time Calculation (min) (ACUTE ONLY): 27 min   Charges:   PT Evaluation $PT Eval Moderate Complexity: 1 Procedure PT Treatments $Gait Training: 8-22 mins   PT G Codes:        Cassell Clement, PT, CSCS Pager [phone] Office [phone]  07/14/2015, 11:22 AM

## 2015-07-14 NOTE — Evaluation (Signed)
Occupational Therapy Evaluation Patient Details Name: Jocelyn Bauer MRN: [id number] DOB: [birth year] Today's Date: 07/14/2015    History of Present Illness 82 y.o. female now s/p revision arthroplasty right elbow. Original replacement performed in the 1990s. PMH: hypertension, Rheumatoid arthritis, anxiety, chronic pain, Lt THA.     Clinical Impression   Pt is a pleasant 82 y/o with right elbow replacement. Set to discharge home with int supervision from daughter. Pt able to demonstrate transfers at min guard, and education provided for ADL, Edema management, ADL (dressing, bathing), and elevation. Pt with increased fatigue. Next session to focus on performing ADL (upper body dressing) and tub transfer. OT will follow acutely to increase safety and independence in ADL as well as reinforce education.    Follow Up Recommendations  Supervision/Assistance - 24 hour    Equipment Recommendations  None recommended by OT    Recommendations for Other Services       Precautions / Restrictions Precautions Precautions: Fall Required Braces or Orthoses: Sling (mission sling as tolerated) Restrictions Weight Bearing Restrictions: Yes RUE Weight Bearing: Non weight bearing      Mobility Bed Mobility Overal bed mobility: Needs Assistance Bed Mobility: Supine to Sit;Sit to Supine     Supine to sit: Min assist;HOB elevated (using bed rails) Sit to supine: Mod assist (using hand rails, for positioning higher in the bed)      Transfers Overall transfer level: Needs assistance Equipment used: None Transfers: Sit to/from Stand Sit to Stand: Min assist         General transfer comment: min assist to stand from elevated surface, min guard for safty for stand to sit, Pt then performed sit to stand min guard on 2nd time sit to stand    Balance                                            ADL Overall ADL's : Needs assistance/impaired         Upper Body Bathing: Set  up;Min guard Upper Body Bathing Details (indicate cue type and reason): education provided for UB bathing with limited ROM and how to protect affected arm Lower Body Bathing: Supervison/ safety;Sitting/lateral leans Lower Body Bathing Details (indicate cue type and reason): will plan on using shower chair if needed Upper Body Dressing : Moderate assistance;Cueing for sequencing;Sitting Upper Body Dressing Details (indicate cue type and reason): education provided for dressing affected UE first because of limited ROM     Toilet Transfer: Minimal assistance;Cueing for sequencing Toilet Transfer Details (indicate cue type and reason): Pt able to power up with min A and sit min guard. Pt unsteady upon initial standing.           General ADL Comments: Pt indicated fatigue from earlier PT session, and Pt provided education for dressing, bathing, edema management, elevation, and other ADL. There was no mission sling in room, RN ordered from [employer] because she was in room during session.     Vision     Perception     Praxis      Pertinent Vitals/Pain Pain Assessment: 0-10 Pain Score: 8  Pain Location: Rt arm and elbow Pain Descriptors / Indicators: Aching;Discomfort;Grimacing;Moaning;Operative site guarding Pain Intervention(s): Monitored during session;RN gave pain meds during session;Repositioned (elevated, and got RN to order mission sling since to room)     Hand Dominance Right   Extremity/Trunk Assessment Upper  Extremity Assessment Upper Extremity Assessment: RUE deficits/detail RUE Deficits / Details: Pt with limited ROM and pain due to surgery           Communication Communication Communication: No difficulties   Cognition Arousal/Alertness: Awake/alert Behavior During Therapy: WFL for tasks assessed/performed Overall Cognitive Status: Within Functional Limits for tasks assessed                     General Comments       Exercises       Shoulder  Instructions      Home Living Family/patient expects to be discharged to:: Private residence Living Arrangements: Children (daughetr lives with Pt) Available Help at Discharge: Family;Available PRN/intermittently Type of Home: House Home Access: Level entry     Home Layout: One level           Bathroom Accessibility: Yes   Home Equipment: Walker - 2 wheels;Walker - standard;Bedside commode;Shower seat   Additional Comments: daughter works at [employer] for housekeeping, and will be available PRN. Has other children in the area who can help as needed also. Pt states that she watches TV most of the day. She reports that she does not walk much because of her planter warts.      Prior Functioning/Environment Level of Independence: Independent        Comments: daughter helped with housework (cooking/cleaning)    OT Diagnosis: Generalized weakness;Acute pain   OT Problem List: Decreased strength;Decreased range of motion;Decreased activity tolerance;Decreased safety awareness;Impaired UE functional use;Pain   OT Treatment/Interventions: Self-care/ADL training;Therapeutic exercise;DME and/or AE instruction;Patient/family education    OT Goals(Current goals can be found in the care plan section) Acute Rehab OT Goals Patient Stated Goal: To go home OT Goal Formulation: With patient/family Time For Goal Achievement: 07/21/15 Potential to Achieve Goals: Good ADL Goals Pt Will Perform Upper Body Bathing: with set-up;with supervision;sitting;standing Pt Will Perform Upper Body Dressing: with min assist;with caregiver independent in assisting Pt Will Perform Lower Body Dressing: with min guard assist;with caregiver independent in assisting;sit to/from stand Pt Will Transfer to Toilet: with modified independence;ambulating;regular height toilet Pt Will Perform Toileting - Clothing Manipulation and hygiene: with modified independence;sit to/from stand Pt Will Perform Tub/Shower  Transfer: with supervision;with caregiver independent in assisting;ambulating;shower seat  OT Frequency: Min 2X/week   Barriers to D/C:            Co-evaluation              End of Session    Activity Tolerance: Patient tolerated treatment well;Patient limited by pain;Patient limited by fatigue Patient left: in bed;with call bell/phone within reach;with family/visitor present (arm elevated in bed on pillows)   Time: 1357-1440 OT Time Calculation (min): 43 min Charges:  OT General Charges $OT Visit: 1 Procedure OT Evaluation $OT Eval Low Complexity: 1 Procedure OT Treatments $Self Care/Home Management : 23-37 mins G-Codes:    Merri Ray Toshiko Kemler 2015-07-17, 4:08 PM

## 2015-07-14 NOTE — Progress Notes (Signed)
Orthopedic Tech Progress Note Patient Details:  Jocelyn Bauer 1928-06-26 [id number]  Ortho Devices Type of Ortho Device: Sling immobilizer Ortho Device/Splint Location: kuzma sling Ortho Device/Splint Interventions: Application   [place] Pink 07/14/2015, 4:32 PM

## 2015-07-14 NOTE — Care Management Important Message (Signed)
Important Message  Patient Details  Name: Jocelyn Bauer MRN: [id number] Date of Birth: [birth year]   Medicare Important Message Given:  Yes    Loann Quill 07/14/2015, 10:35 AM

## 2015-07-14 NOTE — Progress Notes (Signed)
PROGRESS NOTE    Jocelyn Bauer  [id number] DOB: December 18, 1928 DOA: 07/13/2015 PCP: Jerlyn Ly, MD    Assessment & Plan:   Principal Problem:   Right elbow pain Active Problems:   Presence of right artificial elbow joint   Hypertension   Hypothyroidism   Hyperlipidemia   GERD (gastroesophageal reflux disease)   Anxiety   Osteoporosis   Chronic neck and back pain  1.  Total right elbow arthroplasty failure -On 07/13/2015 she was taken to the operative room where she underwent revision of total elbow arthroplasty, procedure performed by Dr Amedeo Plenty. She tolerated procedure well there no immediate couple locations.  2.  Essential hypertension. -Blood pressure stable, last blood pressure reading was 135/49 -For now would recommend current regimen with Norvasc 5 mg by mouth daily, benazepril 20 mg by mouth daily  3.  History of rheumatoid arthritis -She has multiple joint deformities involving bilateral hands and feet -Continue prednisone 5 mg by mouth daily  4.  Pain Management -She is currently on fentanyl 50 g patch every 72 hours, oxycodone 5-10 mh by mouth every 3 hours as needed for moderate pain, and morphine 2 mg IV every 2 hours for severe breakthrough pain   DVT prophylaxis: SCD's Code Status: Full code Family Communication:  Disposition Plan:   Procedures:   Revision of total elbow arthroplasty, procedure performed on 07/13/2015  Antimicrobials:   Zosyn  Vancomycin   Subjective: He reports pain is well controlled, tolerating by mouth intake, denies fevers, chills, nausea, vomiting  Objective: Filed Vitals:   07/13/15 1915 07/13/15 1941 07/14/15 0111 07/14/15 0406  BP: 107/42 108/41 128/52 135/49  Pulse: 66 65 80 83  Temp: 98.2 F (36.8 C) 98.3 F (36.8 C) 99.4 F (37.4 C) 98.8 F (37.1 C)  TempSrc:  Oral Oral Oral  Resp: 11 14 14 14   Height:      Weight:      SpO2: 98% 97% 98% 97%    Intake/Output Summary (Last 24 hours) at 07/14/15  1514 Last data filed at 07/14/15 1100  Gross per 24 hour  Intake   1575 ml  Output   3100 ml  Net  -1525 ml   Filed Weights   07/13/15 1036  Weight: 73.165 kg (161 lb 4.8 oz)    Examination:  General exam: Appears calm and comfortable, In a bedside chair, no acute distress Respiratory system: Clear to auscultation. Respiratory effort normal. Cardiovascular system: S1 & S2 heard, RRR. No JVD, murmurs, rubs, gallops or clicks. No pedal edema. Gastrointestinal system: Abdomen is nondistended, soft and nontender. No organomegaly or masses felt. Normal bowel sounds heard. Central nervous system: Alert and oriented. No focal neurological deficits. Extremities: Right upper extremity is in soft cast, no evidence of active bleeding Skin: No rashes, lesions or ulcers Psychiatry: Judgement and insight appear normal. Mood & affect appropriate.     Data Reviewed: I have personally reviewed following labs and imaging studies  CBC:  Recent Labs Lab 07/13/15 2232  WBC 10.7*  HGB 10.3*  HCT 33.7*  MCV 90.8  PLT 123456   Basic Metabolic Panel:  Recent Labs Lab 07/13/15 2232  NA 137  K 4.2  CL 103  CO2 27  GLUCOSE 117*  BUN 8  CREATININE 0.70  CALCIUM 8.3*   GFR: Estimated Creatinine Clearance: 48.1 mL/min (by C-G formula based on Cr of 0.7). Liver Function Tests: No results for input(s): AST, ALT, ALKPHOS, BILITOT, PROT, ALBUMIN in the last 168 hours. No results for  input(s): LIPASE, AMYLASE in the last 168 hours. No results for input(s): AMMONIA in the last 168 hours. Coagulation Profile: No results for input(s): INR, PROTIME in the last 168 hours. Cardiac Enzymes: No results for input(s): CKTOTAL, CKMB, CKMBINDEX, TROPONINI in the last 168 hours. BNP (last 3 results) No results for input(s): PROBNP in the last 8760 hours. HbA1C: No results for input(s): HGBA1C in the last 72 hours. CBG: No results for input(s): GLUCAP in the last 168 hours. Lipid Profile: No results  for input(s): CHOL, HDL, LDLCALC, TRIG, CHOLHDL, LDLDIRECT in the last 72 hours. Thyroid Function Tests: No results for input(s): TSH, T4TOTAL, FREET4, T3FREE, THYROIDAB in the last 72 hours. Anemia Panel: No results for input(s): VITAMINB12, FOLATE, FERRITIN, TIBC, IRON, RETICCTPCT in the last 72 hours. Sepsis Labs: No results for input(s): PROCALCITON, LATICACIDVEN in the last 168 hours.  Recent Results (from the past 240 hour(s))  Surgical pcr screen     Status: Abnormal   Collection Time: 07/06/15 12:28 PM  Result Value Ref Range Status   MRSA, PCR POSITIVE (A) NEGATIVE Final    Comment: RESULT CALLED TO, READ BACK BY AND VERIFIED WITH: Izell Pound RN 14:55 07/06/15 ([email or web address])    Staphylococcus aureus POSITIVE (A) NEGATIVE Final    Comment:        The Xpert SA Assay (FDA approved for NASAL specimens in patients over 39 years of age), is one component of a comprehensive surveillance program.  Test performance has been validated by [facility] for patients greater than or equal to 79 year old. It is not intended to diagnose infection nor to guide or monitor treatment.   Body fluid culture     Status: None (Preliminary result)   Collection Time: 07/13/15  4:41 PM  Result Value Ref Range Status   Specimen Description SYNOVIAL RIGHT ELBOW  Final   Special Requests FLUID ON SWAB  Final   Gram Stain   Final    FEW WBC PRESENT, PREDOMINANTLY MONONUCLEAR NO ORGANISMS SEEN    Culture NO GROWTH < 24 HOURS  Final   Report Status PENDING  Incomplete  Aerobic/Anaerobic Culture (surgical/deep wound)     Status: None (Preliminary result)   Collection Time: 07/13/15  4:41 PM  Result Value Ref Range Status   Specimen Description TISSUE  Final   Special Requests RIGHT TRICEPS TENDON  Final   Gram Stain   Final    MODERATE WBC PRESENT, PREDOMINANTLY MONONUCLEAR NO ORGANISMS SEEN    Culture NO GROWTH < 24 HOURS  Final   Report Status PENDING  Incomplete         Radiology  Studies: No results found.      Scheduled Meds: . amLODipine  5 mg Oral q morning - 10a  . benazepril  20 mg Oral q morning - 10a  . cholecalciferol  2,000 Units Oral Daily  . docusate sodium  100 mg Oral BID  . [START ON 07/15/2015] fentaNYL  50 mcg Transdermal Q72H  . fluticasone  2 spray Each Nare Daily  . folic acid  1 mg Oral Daily  . levothyroxine  100 mcg Oral QAC breakfast  . omega-3 acid ethyl esters  1 g Oral Daily  . pantoprazole  40 mg Oral Daily  . piperacillin-tazobactam (ZOSYN)  IV  3.375 g Intravenous Q8H  . predniSONE  5 mg Oral Daily  . rosuvastatin  10 mg Oral Q72H  . sertraline  50 mg Oral Daily  . vancomycin  750 mg Intravenous Q24H  .  vitamin C  1,000 mg Oral Daily   Continuous Infusions: . lactated ringers 50 mL/hr at 07/13/15 1135  . lactated ringers       LOS: 1 day    Time spent: 25 min    Kelvin Cellar, MD [facility] Pager [phone]  If 7PM-7AM, please contact night-coverage [email or web address] Password [email or web address] 07/14/2015, 3:14 PM

## 2015-07-14 NOTE — Anesthesia Postprocedure Evaluation (Signed)
Anesthesia Post Note  Patient: Jocelyn Bauer  Procedure(s) Performed: Procedure(s) (LRB): RIGHT ELBOW REVISION TOTAL ELBOW ARTHROPLASTY  (Right)  WITH HARDWARE REMOVAL REPAIR AS NECESSARY (Right)  TRICEP RECONSTRUCTION REPAIR WITH ALLOGRAFT, ULNAR NERVE NEUROLYSIS (Right)  Patient location during evaluation: PACU Anesthesia Type: General and Regional Level of consciousness: awake and alert Pain management: pain level controlled Vital Signs Assessment: post-procedure vital signs reviewed and stable Respiratory status: spontaneous breathing, nonlabored ventilation, respiratory function stable and patient connected to nasal cannula oxygen Cardiovascular status: blood pressure returned to baseline and stable Postop Assessment: no signs of nausea or vomiting Anesthetic complications: no              Zenaida Deed

## 2015-07-14 NOTE — Progress Notes (Signed)
Subjective: 1 Day Post-Op Procedure(s) (LRB): RIGHT ELBOW REVISION TOTAL ELBOW ARTHROPLASTY  (Right)  WITH HARDWARE REMOVAL REPAIR AS NECESSARY (Right)  TRICEP RECONSTRUCTION REPAIR WITH ALLOGRAFT, ULNAR NERVE NEUROLYSIS (Right) Patient reports pain as moderate tol po Feels tired Family at bedside  Objective: Vital signs in last 24 hours: Temp:  [98.1 F (36.7 C)-99.4 F (37.4 C)] 98.1 F (36.7 C) (07/07 2140) Pulse Rate:  [78-86] 86 (07/07 2140) Resp:  [14-16] 16 (07/07 1534) BP: (115-135)/(43-52) 115/43 mmHg (07/07 2140) SpO2:  [92 %-98 %] 93 % (07/07 2140)  Intake/Output from previous day: 07/06 0701 - 07/07 0700 In: 2575 [P.O.:400; I.V.:1725; IV Piggyback:450] Out: 2165 [Urine:1980; Drains:85; Blood:100] Intake/Output this shift: Total I/O In: -  Out: 250 [Urine:250]   Recent Labs  07/13/15 2232  HGB 10.3*    Recent Labs  07/13/15 2232  WBC 10.7*  RBC 3.71*  HCT 33.7*  PLT 345    Recent Labs  07/13/15 2232  NA 137  K 4.2  CL 103  CO2 27  BUN 8  CREATININE 0.70  GLUCOSE 117*  CALCIUM 8.3*   No results for input(s): LABPT, INR in the last 72 hours.  PE: NVI normal sensation and pulse/refill Drain dced The patient is alert and oriented in no acute distress. The patient complains of pain in the affected upper extremity.  The patient is noted to have a normal HEENT exam. Lung fields show equal chest expansion and no shortness of breath. Abdomen exam is nontender without distention. Lower extremity examination does not show any fracture dislocation or blood clot symptoms. Pelvis is stable and the neck and back are stable and nontender.  Assessment/Plan: 1 Day Post-Op Procedure(s) (LRB): RIGHT ELBOW REVISION TOTAL ELBOW ARTHROPLASTY  (Right)  WITH HARDWARE REMOVAL REPAIR AS NECESSARY (Right)  TRICEP RECONSTRUCTION REPAIR WITH ALLOGRAFT, ULNAR NERVE NEUROLYSIS (Right) Await culture and cont IV ABX Stable  Vontrell Pullman III,Aariyana Manz M 07/14/2015, 10:16  PM

## 2015-07-15 LAB — BASIC METABOLIC PANEL
ANION GAP: 7 (ref 5–15)
BUN: 7 mg/dL (ref 6–20)
CALCIUM: 8.2 mg/dL — AB (ref 8.9–10.3)
CO2: 26 mmol/L (ref 22–32)
Chloride: 102 mmol/L (ref 101–111)
Creatinine, Ser: 0.74 mg/dL (ref 0.44–1.00)
GLUCOSE: 135 mg/dL — AB (ref 65–99)
Potassium: 3.9 mmol/L (ref 3.5–5.1)
SODIUM: 135 mmol/L (ref 135–145)

## 2015-07-15 LAB — CBC
HCT: 34.1 % — ABNORMAL LOW (ref 36.0–46.0)
HEMOGLOBIN: 10.4 g/dL — AB (ref 12.0–15.0)
MCH: 27.6 pg (ref 26.0–34.0)
MCHC: 30.5 g/dL (ref 30.0–36.0)
MCV: 90.5 fL (ref 78.0–100.0)
Platelets: 375 10*3/uL (ref 150–400)
RBC: 3.77 MIL/uL — ABNORMAL LOW (ref 3.87–5.11)
RDW: 18.5 % — ABNORMAL HIGH (ref 11.5–15.5)
WBC: 9.5 10*3/uL (ref 4.0–10.5)

## 2015-07-15 NOTE — Clinical Social Work Note (Signed)
Clinical Social Work Assessment  Patient Details  Name: Jocelyn Bauer MRN: [id number] Date of Birth: 07-04-1928  Date of referral:  07/15/15               Reason for consult:  Discharge Planning                Permission sought to share information with:  Case Manager, Facility Sport and exercise psychologist, Family Supports Permission granted to share information::  Yes, Verbal Permission Granted  Name::        Agency::   (SNF)  Relationship::     Contact Information:     Housing/Transportation Living arrangements for the past 2 months:  Single Family Home Source of Information:  Patient, Medical Team, Adult Children Patient Interpreter Needed:  None Criminal Activity/Legal Involvement Pertinent to Current Situation/Hospitalization:  No - Comment as needed Significant Relationships:  Adult Children Lives with:  Adult Children Do you feel safe going back to the place where you live?  No Need for family participation in patient care:  Yes (Comment)  Care giving concerns: Pt and pt family expressed concerns about pt discharging home instead of SNF.Pt and family agreeable to SNF for higher level of care.   CSW will follow up with bed offers once available.    Social Worker assessment: Holiday representative me with pt and family and discussed CSW role with discharge planning. Pt shares that she was admitted from home. Pt daughter stated that she works during the day and that there will be no one home while she is at work to provide pt with 24 hour care. Pt family stated that they spoke with case management and shared concerns about pt needing higher level of care instead of discharging home with home health as the family is not able to provide pt with needed care at home. Pt and family requested [facility] for SNF.   Employment status:  Retired Forensic scientist:  Medicare PT Recommendations:  [place] / Referral to community resources:  [place]  Patient/Family's Response to care: Pt pleasant and lying in bed with family at bedside. Pt also appears happy with care she is receiving at [facility].   Patient/Family's Understanding of and Emotional Response to Diagnosis, Current Treatment, and Prognosis: Pt and family appear to have a good understanding of reason for pt admission into hospital and pt care plan.   Emotional Assessment Appearance:  Appears younger than stated age Attitude/Demeanor/Rapport:   (Pleasant) Affect (typically observed):  Accepting, Quiet, Pleasant Orientation:  Oriented to Self, Oriented to Place, Oriented to  Time, Oriented to Situation Alcohol / Substance use:  Not Applicable Psych involvement (Current and /or in the community):  No (Comment)  Discharge Needs  Concerns to be addressed:  Discharge Planning Concerns Readmission within the last 30 days:    Current discharge risk:  Other (Pt has no one to provide 24hour care at home during the day) Barriers to Discharge:  Continued Medical Work up   [employer], LCSW 07/15/2015, 11:43 AM

## 2015-07-15 NOTE — Progress Notes (Signed)
OT Cancellation Note  Patient Details Name: Jocelyn Bauer MRN: [id number] DOB: 24-Feb-1928   Cancelled Treatment:    Reason Eval/Treat Not Completed: Fatigue/lethargy limiting ability to participate.  Attempted skilled OT.  Pt. Had completed PT this am. Pt. And family report pt. Can not tolerate two am therapy sessions and request OT come back this pm.  Will alert OTR/L to re attempt this pm as able.    Janice Coffin, COTA/L 07/15/2015, 9:59 AM

## 2015-07-15 NOTE — Progress Notes (Signed)
Subjective: 2 Days Post-Op Procedure(s) (LRB): RIGHT ELBOW REVISION TOTAL ELBOW ARTHROPLASTY  (Right)  WITH HARDWARE REMOVAL REPAIR AS NECESSARY (Right)  TRICEP RECONSTRUCTION REPAIR WITH ALLOGRAFT, ULNAR NERVE NEUROLYSIS (Right) Patient reports pain as mild.   She is tolerating her diet well.  She has no signs of infection dystrophy or vascular compromise on her exam.  She is noting that she can void well but is not had a bowel movement yet. Her Foley is obviously out  Overall she looks quite well. She states she is ready to mobilize a bit more.  Objective: Vital signs in last 24 hours: Temp:  [98 F (36.7 C)-99.2 F (37.3 C)] 98 F (36.7 C) (07/08 0400) Pulse Rate:  [78-87] 87 (07/08 0400) Resp:  [16] 16 (07/07 1534) BP: (115-145)/(43-65) 145/65 mmHg (07/08 0400) SpO2:  [92 %-99 %] 99 % (07/08 0400)  Intake/Output from previous day: 07/07 0701 - 07/08 0700 In: -  Out: 2060 [Urine:2060] Intake/Output this shift:     Recent Labs  07/13/15 2232 07/15/15 0531  HGB 10.3* 10.4*    Recent Labs  07/13/15 2232 07/15/15 0531  WBC 10.7* 9.5  RBC 3.71* 3.77*  HCT 33.7* 34.1*  PLT 345 375    Recent Labs  07/13/15 2232 07/15/15 0531  NA 137 135  K 4.2 3.9  CL 103 102  CO2 27 26  BUN 8 7  CREATININE 0.70 0.74  GLUCOSE 117* 135*  CALCIUM 8.3* 8.2*   No results for input(s): LABPT, INR in the last 72 hours. Physical exam: She is alert and oriented. No signs of DVT. Elbow is stable and in its bandage Neurologically intact ABD soft Neurovascular intact Sensation intact distally No cellulitis present Compartment soft She is walking around without great difficulty. Assessment/Plan: 2 Days Post-Op Procedure(s) (LRB): RIGHT ELBOW REVISION TOTAL ELBOW ARTHROPLASTY  (Right)  WITH HARDWARE REMOVAL REPAIR AS NECESSARY (Right)  TRICEP RECONSTRUCTION REPAIR WITH ALLOGRAFT, ULNAR NERVE NEUROLYSIS (Right) Up with therapy We will continue IV antibiotics until cultures  are final.  Patient is aware of the plans. I would anticipate discharge Monday when cultures are final. We'll continue to mobilize her. Given the loss of her right arm for support we need to make sure she is steady on her feet prior to discharge  All questions have been encouraged and answered. I discussed all issues with her family.  Appreciate medicines help with this patient Paulene Floor 07/15/2015, 7:33 AM

## 2015-07-15 NOTE — NC FL2 (Signed)
[place] MEDICAID FL2 LEVEL OF CARE SCREENING TOOL     IDENTIFICATION  Patient Name: Jocelyn Bauer Birthdate: December 26, 1928 Sex: female Admission Date (Current Location): 07/13/2015  [facility] and [place] Number:  Herbalist and Address:  [facility], [place] [address], [place], [place] [address]      Provider Number: [id number]  Attending Physician Name and Address:  Roseanne Kaufman, MD  Relative Name and Phone Number:       Current Level of Care: Hospital Recommended Level of Care: [place] Prior Approval Number:    Date Approved/Denied:   PASRR Number:   [id number]   Discharge Plan: SNF    Current Diagnoses: Patient Active Problem List   Diagnosis Date Noted  . Right elbow pain 07/14/2015  . Presence of right artificial elbow joint 07/13/2015  . Hypertension   . Hypothyroidism   . Hyperlipidemia   . GERD (gastroesophageal reflux disease)   . Anxiety   . Osteoporosis   . Chronic neck and back pain   . 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    Orientation RESPIRATION BLADDER Height & Weight     Self, Time, Situation, Place  Normal Continent Weight: 161 lb 4.8 oz (73.165 kg) Height:  5\' 2"  (157.5 cm)  BEHAVIORAL SYMPTOMS/MOOD NEUROLOGICAL BOWEL NUTRITION STATUS   (None)  (None) Continent Diet (Heart room service)  AMBULATORY STATUS COMMUNICATION OF NEEDS Skin   Extensive Assist Verbally Surgical wounds                       Personal Care Assistance Level of Assistance  Bathing, Feeding, Dressing Bathing Assistance: Limited assistance Feeding assistance: Independent Dressing Assistance: Limited assistance     Functional Limitations Info  Sight, Hearing, Speech Sight Info: Adequate Hearing Info: Adequate Speech Info: Adequate    SPECIAL CARE FACTORS FREQUENCY  PT (By licensed PT)     PT Frequency:  (5x/week)              Contractures Contractures Info: Not present    Additional Factors  Info  Allergies Code Status Info:  (full) Allergies Info:  (Contrast Media, Tdap)           Current Medications (07/15/2015):  This is the current hospital active medication list Current Facility-Administered Medications  Medication Dose Route Frequency Provider Last Rate Last Dose  . amLODipine (NORVASC) tablet 5 mg  5 mg Oral q morning - 10a Roseanne Kaufman, MD   5 mg at 07/15/15 1024  . benazepril (LOTENSIN) tablet 20 mg  20 mg Oral q morning - 10a Roseanne Kaufman, MD   20 mg at 07/15/15 1024  . Chlorhexidine Gluconate Cloth 2 % PADS 6 each  6 each Topical Q0600 Roseanne Kaufman, MD   6 each at 07/15/15 0600  . cholecalciferol (VITAMIN D) tablet 2,000 Units  2,000 Units Oral Daily Roseanne Kaufman, MD   2,000 Units at 07/15/15 1024  . docusate sodium (COLACE) capsule 100 mg  100 mg Oral BID Roseanne Kaufman, MD   100 mg at 07/15/15 1023  . fentaNYL (DURAGESIC - dosed mcg/hr) 50 mcg  50 mcg Transdermal Q72H Roseanne Kaufman, MD   50 mcg at 07/15/15 0818  . fluticasone (FLONASE) 50 MCG/ACT nasal spray 2 spray  2 spray Each Nare Daily Ivor Costa, MD   2 spray at 07/14/15 1109  . folic acid (FOLVITE) tablet 1 mg  1 mg Oral Daily Ivor Costa, MD   1  mg at 07/15/15 1024  . furosemide (LASIX) tablet 20 mg  20 mg Oral Q48H PRN Roseanne Kaufman, MD      . lactated ringers infusion   Intravenous Continuous Oleta Mouse, MD 50 mL/hr at 07/13/15 1135    . lactated ringers infusion   Intravenous Continuous Roseanne Kaufman, MD      . levothyroxine (SYNTHROID, LEVOTHROID) tablet 100 mcg  100 mcg Oral QAC breakfast Roseanne Kaufman, MD   100 mcg at 07/15/15 0818  . LORazepam (ATIVAN) tablet 0.25-5 mg  0.25-5 mg Oral BID PRN Roseanne Kaufman, MD   0.5 mg at 07/14/15 1651  . methocarbamol (ROBAXIN) tablet 500 mg  500 mg Oral Q6H PRN Roseanne Kaufman, MD   500 mg at 07/15/15 0449   Or  . methocarbamol (ROBAXIN) 500 mg in dextrose 5 % 50 mL IVPB  500 mg Intravenous Q6H PRN Roseanne Kaufman, MD      . morphine 2 MG/ML injection  1 mg  1 mg Intravenous Q2H PRN Roseanne Kaufman, MD   1 mg at 07/14/15 1637  . mupirocin ointment (BACTROBAN) 2 % 1 application  1 application Nasal BID Roseanne Kaufman, MD   1 application at [id number]  . naphazoline-glycerin (CLEAR EYES) ophth solution 1-2 drop  1-2 drop Both Eyes QID PRN Ivor Costa, MD      . omega-3 acid ethyl esters (LOVAZA) capsule 1 g  1 g Oral Daily Ivor Costa, MD   1 g at 07/15/15 1024  . ondansetron (ZOFRAN) tablet 4 mg  4 mg Oral Q6H PRN Roseanne Kaufman, MD       Or  . ondansetron [facility]) injection 4 mg  4 mg Intravenous Q6H PRN Roseanne Kaufman, MD      . oxyCODONE (Oxy IR/ROXICODONE) immediate release tablet 5-10 mg  5-10 mg Oral Q3H PRN Roseanne Kaufman, MD   10 mg at 07/15/15 0817  . pantoprazole (PROTONIX) EC tablet 40 mg  40 mg Oral Daily Roseanne Kaufman, MD   40 mg at 07/15/15 1024  . piperacillin-tazobactam (ZOSYN) IVPB 3.375 g  3.375 g Intravenous Q8H Roseanne Kaufman, MD   3.375 g at 07/15/15 0818  . predniSONE (DELTASONE) tablet 5 mg  5 mg Oral Daily Roseanne Kaufman, MD   5 mg at 07/15/15 1024  . rosuvastatin (CRESTOR) tablet 10 mg  10 mg Oral Q72H Roseanne Kaufman, MD   10 mg at 07/14/15 1807  . senna (SENOKOT) tablet 8.6 mg  1 tablet Oral Daily PRN Ivor Costa, MD      . sertraline (ZOLOFT) tablet 50 mg  50 mg Oral Daily Roseanne Kaufman, MD   50 mg at 07/15/15 1024  . vancomycin (VANCOCIN) IVPB 750 mg/150 ml premix  750 mg Intravenous Q24H Roseanne Kaufman, MD   750 mg at 07/14/15 2131  . vitamin C (ASCORBIC ACID) tablet 1,000 mg  1,000 mg Oral Daily Roseanne Kaufman, MD   1,000 mg at 07/15/15 1024     Discharge Medications: Please see discharge summary for a list of discharge medications.  Relevant Imaging Results:  Relevant Lab Results:   Additional Information [id number]  Junie Spencer, LCSW

## 2015-07-15 NOTE — Progress Notes (Signed)
Physical Therapy Treatment Patient Details Name: Jocelyn Bauer MRN: [id number] DOB: Apr 04, 1928 Today's Date: 07/15/2015    History of Present Illness 82 y.o. female now s/p revision arthroplasty right elbow. Original replacement performed in the 1990s. PMH: hypertension, Rheumatoid arthritis, anxiety, chronic pain, Lt THA.      PT Comments    Patient is progressing toward mobility goals. Min/mod for bed mobility. Tolerated stair training well with min guard. Current plan remains appropriate.   Follow Up Recommendations  No PT follow up;Supervision for mobility/OOB     Equipment Recommendations  None recommended by PT    Recommendations for Other Services       Precautions / Restrictions Precautions Precautions: Fall Required Braces or Orthoses: Sling (mission sling per comfort) Restrictions Weight Bearing Restrictions: Yes RUE Weight Bearing: Non weight bearing    Mobility  Bed Mobility Overal bed mobility: Needs Assistance Bed Mobility: Supine to Sit;Sit to Supine     Supine to sit: Min assist;HOB elevated Sit to supine: Min assist   General bed mobility comments: assist to elevate trunk into sitting and to elevate bilat LE into bed when returned to supine; cues for sequencing and technique  Transfers Overall transfer level: Needs assistance Equipment used: None Transfers: Sit to/from Stand Sit to Stand: Min guard         General transfer comment: min guard for safety  Ambulation/Gait Ambulation/Gait assistance: Min guard Ambulation Distance (Feet): 120 Feet Assistive device: Straight cane Gait Pattern/deviations: Shuffle;Wide base of support Gait velocity: decreased   General Gait Details: cues for sequencing of gait with use of AD, forward gaze, and safety awareness as pt became very close to hitting R elbow a few times    Stairs Stairs: Yes Stairs assistance: Min guard Stair Management: One rail Left;Forwards;Sideways Number of Stairs: 3 General  stair comments: educated pt on sequencing and technique; forward to ascend and sideways to descend; min guard for safety  Wheelchair Mobility    Modified Rankin (Stroke Patients Only)       Balance Overall balance assessment: Needs assistance   Sitting balance-Leahy Scale: Good     Standing balance support: Single extremity supported;During functional activity Standing balance-Leahy Scale: Fair                      Cognition Arousal/Alertness: Awake/alert Behavior During Therapy: WFL for tasks assessed/performed Overall Cognitive Status: Within Functional Limits for tasks assessed                      Exercises      General Comments        Pertinent Vitals/Pain Pain Assessment: Faces Faces Pain Scale: Hurts a little bit Pain Location: R elbow Pain Descriptors / Indicators: Discomfort;Guarding;Sore Pain Intervention(s): Limited activity within patient's tolerance;Monitored during session;Premedicated before session;Repositioned    Home Living                      Prior Function            PT Goals (current goals can now be found in the care plan section) Acute Rehab PT Goals Patient Stated Goal: go home PT Goal Formulation: With patient Time For Goal Achievement: 07/28/15 Potential to Achieve Goals: Good Progress towards PT goals: Progressing toward goals    Frequency  Min 3X/week    PT Plan Current plan remains appropriate    Co-evaluation             End  of Session Equipment Utilized During Treatment: Gait belt;Other (comment) (mission sling) Activity Tolerance: Patient tolerated treatment well Patient left: with call bell/phone within reach;in bed;with family/visitor present (Rt UE elevated above heart)     Time: [id number] PT Time Calculation (min) (ACUTE ONLY): 36 min  Charges:  $Gait Training: 8-22 mins $Therapeutic Activity: 8-22 mins                    G Codes:      [place] April,  PTA Pager: [phone]   07/15/2015, 9:31 AM

## 2015-07-15 NOTE — Clinical Social Work Placement (Signed)
   CLINICAL SOCIAL WORK PLACEMENT  NOTE  Date:  07/15/2015  Patient Details  Name: NAKITA FODNESS MRN: [id number] Date of Birth: 1928/06/29  Clinical Social Work is seeking post-discharge placement for this patient at the [place] level of care (*CSW will initial, date and re-position this form in  chart as items are completed):  Yes   Patient/family provided with [facility] Work Department's list of facilities offering this level of care within the geographic area requested by the patient (or if unable, by the patient's family).  Yes   Patient/family informed of their freedom to choose among providers that offer the needed level of care, that participate in Medicare, Medicaid or managed care program needed by the patient, have an available bed and are willing to accept the patient.  Yes   Patient/family informed of [facility] [facility]'s ownership interest in [facility] and [facility], as well as of the fact that they are under no obligation to receive care at these facilities.  PASRR submitted to EDS on       PASRR number received on       Existing PASRR number confirmed on 07/15/15     FL2 transmitted to all facilities in geographic area requested by pt/family on 07/15/15     FL2 transmitted to all facilities within larger geographic area on       Patient informed that his/her managed care company has contracts with or will negotiate with certain facilities, including the following:            Patient/family informed of bed offers received.  Patient chooses bed at       Physician recommends and patient chooses bed at      Patient to be transferred to   on  .  Patient to be transferred to facility by       Patient family notified on   of transfer.  Name of family member notified:        PHYSICIAN Please prepare priority discharge summary, including medications, Please prepare prescriptions, Please sign FL2     Additional Comment:     _______________________________________________ Junie Spencer, LCSW 07/15/2015, 12:10 PM

## 2015-07-15 NOTE — Progress Notes (Signed)
PROGRESS NOTE    Jocelyn Bauer  [id number] DOB: 04/08/1928 DOA: 07/13/2015 PCP: Jerlyn Ly, MD    Assessment & Plan:   Principal Problem:   Right elbow pain Active Problems:   Presence of right artificial elbow joint   Hypertension   Hypothyroidism   Hyperlipidemia   GERD (gastroesophageal reflux disease)   Anxiety   Osteoporosis   Chronic neck and back pain  1.  Total right elbow arthroplasty failure -On 07/13/2015 she was taken to the operative room where she underwent revision of total elbow arthroplasty, procedure performed by Dr Amedeo Plenty. She tolerated procedure well there no immediate couple locations.  2.  Essential hypertension. -Blood pressure stable, last blood pressure reading was 126/49 -For now would recommend current regimen with Norvasc 5 mg by mouth daily, benazepril 20 mg by mouth daily  3.  History of rheumatoid arthritis -She has multiple joint deformities involving bilateral hands and feet -Continue prednisone 5 mg by mouth daily  4.  Pain Management -She is currently on fentanyl 50 g patch every 72 hours, oxycodone 5-10 mh by mouth every 3 hours as needed for moderate pain, and morphine 2 mg IV every 2 hours for severe breakthrough pain  5.  Infectious disease -Cultures showing no growth to date -She remains on IV vancomycin and Zosyn   DVT prophylaxis: SCD's Code Status: Full code Family Communication:  Disposition Plan: Plan for discharge this coming Monday   Procedures:   Revision of total elbow arthroplasty, procedure performed on 07/13/2015  Antimicrobials:   Zosyn  Vancomycin   Subjective: GI bleed on the hallway with physical therapy, states her right elbow hurts her on and off however pain medications help  Objective: Filed Vitals:   07/14/15 1534 07/14/15 2140 07/15/15 0400 07/15/15 1023  BP: 130/49 115/43 145/65 126/49  Pulse: 78 86 87 76  Temp: 99.2 F (37.3 C) 98.1 F (36.7 C) 98 F (36.7 C) 98.1 F (36.7 C)   TempSrc:  Oral Oral Oral  Resp: 16     Height:      Weight:      SpO2: 92% 93% 99% 96%    Intake/Output Summary (Last 24 hours) at 07/15/15 1429 Last data filed at 07/15/15 0700  Gross per 24 hour  Intake      0 ml  Output    800 ml  Net   -800 ml   Filed Weights   07/13/15 1036  Weight: 73.165 kg (161 lb 4.8 oz)    Examination:  General exam: Appears calm and comfortable, In a bedside chair, no acute distress Respiratory system: Clear to auscultation. Respiratory effort normal. Cardiovascular system: S1 & S2 heard, RRR. No JVD, murmurs, rubs, gallops or clicks. No pedal edema. Gastrointestinal system: Abdomen is nondistended, soft and nontender. No organomegaly or masses felt. Normal bowel sounds heard. Central nervous system: Alert and oriented. No focal neurological deficits. Extremities: Right upper extremity is in soft cast, no evidence of active bleeding Skin: No rashes, lesions or ulcers Psychiatry: Judgement and insight appear normal. Mood & affect appropriate.     Data Reviewed: I have personally reviewed following labs and imaging studies  CBC:  Recent Labs Lab 07/13/15 2232 07/15/15 0531  WBC 10.7* 9.5  HGB 10.3* 10.4*  HCT 33.7* 34.1*  MCV 90.8 90.5  PLT 345 [id number]   Basic Metabolic Panel:  Recent Labs Lab 07/13/15 2232 07/15/15 0531  NA 137 135  K 4.2 3.9  CL 103 102  CO2 27 26  GLUCOSE 117* 135*  BUN 8 7  CREATININE 0.70 0.74  CALCIUM 8.3* 8.2*   GFR: Estimated Creatinine Clearance: 48.1 mL/min (by C-G formula based on Cr of 0.74). Liver Function Tests: No results for input(s): AST, ALT, ALKPHOS, BILITOT, PROT, ALBUMIN in the last 168 hours. No results for input(s): LIPASE, AMYLASE in the last 168 hours. No results for input(s): AMMONIA in the last 168 hours. Coagulation Profile: No results for input(s): INR, PROTIME in the last 168 hours. Cardiac Enzymes: No results for input(s): CKTOTAL, CKMB, CKMBINDEX, TROPONINI in the last 168  hours. BNP (last 3 results) No results for input(s): PROBNP in the last 8760 hours. HbA1C: No results for input(s): HGBA1C in the last 72 hours. CBG: No results for input(s): GLUCAP in the last 168 hours. Lipid Profile: No results for input(s): CHOL, HDL, LDLCALC, TRIG, CHOLHDL, LDLDIRECT in the last 72 hours. Thyroid Function Tests: No results for input(s): TSH, T4TOTAL, FREET4, T3FREE, THYROIDAB in the last 72 hours. Anemia Panel: No results for input(s): VITAMINB12, FOLATE, FERRITIN, TIBC, IRON, RETICCTPCT in the last 72 hours. Sepsis Labs: No results for input(s): PROCALCITON, LATICACIDVEN in the last 168 hours.  Recent Results (from the past 240 hour(s))  Surgical pcr screen     Status: Abnormal   Collection Time: 07/06/15 12:28 PM  Result Value Ref Range Status   MRSA, PCR POSITIVE (A) NEGATIVE Final    Comment: RESULT CALLED TO, READ BACK BY AND VERIFIED WITH: Izell [place] RN 14:55 07/06/15 ([email or web address])    Staphylococcus aureus POSITIVE (A) NEGATIVE Final    Comment:        The Xpert SA Assay (FDA approved for NASAL specimens in patients over 16 years of age), is one component of a comprehensive surveillance program.  Test performance has been validated by [facility] for patients greater than or equal to 61 year old. It is not intended to diagnose infection nor to guide or monitor treatment.   Anaerobic culture     Status: None (Preliminary result)   Collection Time: 07/13/15  4:41 PM  Result Value Ref Range Status   Specimen Description SYNOVIAL RIGHT ELBOW  Final   Special Requests FLUID ON SWAB  Final   Gram Stain   Final    FEW WBC PRESENT, PREDOMINANTLY MONONUCLEAR NO ORGANISMS SEEN    Culture   Final    NO ANAEROBES ISOLATED; CULTURE IN PROGRESS FOR 5 DAYS   Report Status PENDING  Incomplete  Body fluid culture     Status: None (Preliminary result)   Collection Time: 07/13/15  4:41 PM  Result Value Ref Range Status   Specimen Description SYNOVIAL RIGHT ELBOW   Final   Special Requests FLUID ON SWAB  Final   Gram Stain   Final    FEW WBC PRESENT, PREDOMINANTLY MONONUCLEAR NO ORGANISMS SEEN    Culture NO GROWTH 2 DAYS  Final   Report Status PENDING  Incomplete  Aerobic/Anaerobic Culture (surgical/deep wound)     Status: None (Preliminary result)   Collection Time: 07/13/15  4:41 PM  Result Value Ref Range Status   Specimen Description TISSUE  Final   Special Requests RIGHT TRICEPS TENDON  Final   Gram Stain   Final    MODERATE WBC PRESENT, PREDOMINANTLY MONONUCLEAR NO ORGANISMS SEEN    Culture   Final    NO GROWTH 2 DAYS NO ANAEROBES ISOLATED; CULTURE IN PROGRESS FOR 5 DAYS   Report Status PENDING  Incomplete  Radiology Studies: No results found.      Scheduled Meds: . amLODipine  5 mg Oral q morning - 10a  . benazepril  20 mg Oral q morning - 10a  . Chlorhexidine Gluconate Cloth  6 each Topical Q0600  . cholecalciferol  2,000 Units Oral Daily  . docusate sodium  100 mg Oral BID  . fentaNYL  50 mcg Transdermal Q72H  . fluticasone  2 spray Each Nare Daily  . folic acid  1 mg Oral Daily  . levothyroxine  100 mcg Oral QAC breakfast  . mupirocin ointment  1 application Nasal BID  . omega-3 acid ethyl esters  1 g Oral Daily  . pantoprazole  40 mg Oral Daily  . piperacillin-tazobactam (ZOSYN)  IV  3.375 g Intravenous Q8H  . predniSONE  5 mg Oral Daily  . rosuvastatin  10 mg Oral Q72H  . sertraline  50 mg Oral Daily  . vancomycin  750 mg Intravenous Q24H  . vitamin C  1,000 mg Oral Daily   Continuous Infusions: . lactated ringers 50 mL/hr at 07/13/15 1135  . lactated ringers       LOS: 2 days    Time spent: 15 min    Kelvin Cellar, MD [facility] Pager [phone]  If 7PM-7AM, please contact night-coverage [email or web address] Password [email or web address] 07/15/2015, 2:29 PM

## 2015-07-16 NOTE — Progress Notes (Signed)
PROGRESS NOTE    Jocelyn Bauer  [id number] DOB: [birth year] DOA: 07/13/2015 PCP: Jerlyn Ly, MD    Assessment & Plan:   Principal Problem:   Right elbow pain Active Problems:   Presence of right artificial elbow joint   Hypertension   Hypothyroidism   Hyperlipidemia   GERD (gastroesophageal reflux disease)   Anxiety   Osteoporosis   Chronic neck and back pain  1.  Total right elbow arthroplasty failure -On 07/13/2015 she was taken to the operative room where she underwent revision of total elbow arthroplasty, procedure performed by Dr Amedeo Plenty. She tolerated procedure well there no immediate couple locations.  2.  Essential hypertension. -Blood pressure stable, last blood pressure reading was 123/48 -For now would recommend current regimen with Norvasc 5 mg by mouth daily and benazepril 20 mg by mouth daily  3.  History of rheumatoid arthritis -She has multiple joint deformities involving bilateral hands and feet -Continue prednisone 5 mg by mouth daily  4.  Pain Management -She is currently on fentanyl 50 g patch every 72 hours, oxycodone 5-10 mh by mouth every 3 hours as needed for moderate pain, and morphine 2 mg IV every 2 hours for severe breakthrough pain  5.  Infectious disease -On 07/16/2015 cultures continue to show no growth -She remains on IV vancomycin and Zosyn   Overall I think she is doing well, working with physical therapy. No acute medical issues at this time. Cultures remain sterile. Will sign off, please call with any questions. Thank you   DVT prophylaxis: SCD's Code Status: Full code Family Communication:  Disposition Plan: Plan for discharge in the next 24-48 hours  Procedures:   Revision of total elbow arthroplasty, procedure performed on 07/13/2015  Antimicrobials:   Zosyn  Vancomycin   Subjective: She has been getting out of bed to chair. Ambulated with PT.   Objective: Filed Vitals:   07/15/15 1635 07/15/15 2117 07/16/15  0508 07/16/15 1437  BP: 141/65 128/45 140/62 123/48  Pulse: 98 83 88 78  Temp: 97.6 F (36.4 C) 99 F (37.2 C) 98.2 F (36.8 C) 98.4 F (36.9 C)  TempSrc: Oral Oral Oral Oral  Resp: 16   17  Height:      Weight:      SpO2: 97% 93% 97% 95%    Intake/Output Summary (Last 24 hours) at 07/16/15 1503 Last data filed at 07/16/15 1300  Gross per 24 hour  Intake   1375 ml  Output      0 ml  Net   1375 ml   Filed Weights   07/13/15 1036  Weight: 73.165 kg (161 lb 4.8 oz)    Examination:  General exam: Appears calm and comfortable, In a bedside chair, no acute distress Respiratory system: Clear to auscultation. Respiratory effort normal. Cardiovascular system: S1 & S2 heard, RRR. No JVD, murmurs, rubs, gallops or clicks. No pedal edema. Gastrointestinal system: Abdomen is nondistended, soft and nontender. No organomegaly or masses felt. Normal bowel sounds heard. Central nervous system: Alert and oriented. No focal neurological deficits. Extremities: Right upper extremity is in soft cast, no evidence of active bleeding Skin: No rashes, lesions or ulcers Psychiatry: Judgement and insight appear normal. Mood & affect appropriate.     Data Reviewed: I have personally reviewed following labs and imaging studies  CBC:  Recent Labs Lab 07/13/15 2232 07/15/15 0531  WBC 10.7* 9.5  HGB 10.3* 10.4*  HCT 33.7* 34.1*  MCV 90.8 90.5  PLT 345 375  Basic Metabolic Panel:  Recent Labs Lab 07/13/15 2232 07/15/15 0531  NA 137 135  K 4.2 3.9  CL 103 102  CO2 27 26  GLUCOSE 117* 135*  BUN 8 7  CREATININE 0.70 0.74  CALCIUM 8.3* 8.2*   GFR: Estimated Creatinine Clearance: 48.1 mL/min (by C-G formula based on Cr of 0.74). Liver Function Tests: No results for input(s): AST, ALT, ALKPHOS, BILITOT, PROT, ALBUMIN in the last 168 hours. No results for input(s): LIPASE, AMYLASE in the last 168 hours. No results for input(s): AMMONIA in the last 168 hours. Coagulation  Profile: No results for input(s): INR, PROTIME in the last 168 hours. Cardiac Enzymes: No results for input(s): CKTOTAL, CKMB, CKMBINDEX, TROPONINI in the last 168 hours. BNP (last 3 results) No results for input(s): PROBNP in the last 8760 hours. HbA1C: No results for input(s): HGBA1C in the last 72 hours. CBG: No results for input(s): GLUCAP in the last 168 hours. Lipid Profile: No results for input(s): CHOL, HDL, LDLCALC, TRIG, CHOLHDL, LDLDIRECT in the last 72 hours. Thyroid Function Tests: No results for input(s): TSH, T4TOTAL, FREET4, T3FREE, THYROIDAB in the last 72 hours. Anemia Panel: No results for input(s): VITAMINB12, FOLATE, FERRITIN, TIBC, IRON, RETICCTPCT in the last 72 hours. Sepsis Labs: No results for input(s): PROCALCITON, LATICACIDVEN in the last 168 hours.  Recent Results (from the past 240 hour(s))  Anaerobic culture     Status: None (Preliminary result)   Collection Time: 07/13/15  4:41 PM  Result Value Ref Range Status   Specimen Description SYNOVIAL RIGHT ELBOW  Final   Special Requests FLUID ON SWAB  Final   Gram Stain   Final    FEW WBC PRESENT, PREDOMINANTLY MONONUCLEAR NO ORGANISMS SEEN    Culture   Final    NO ANAEROBES ISOLATED; CULTURE IN PROGRESS FOR 5 DAYS   Report Status PENDING  Incomplete  Body fluid culture     Status: None (Preliminary result)   Collection Time: 07/13/15  4:41 PM  Result Value Ref Range Status   Specimen Description SYNOVIAL RIGHT ELBOW  Final   Special Requests FLUID ON SWAB  Final   Gram Stain   Final    FEW WBC PRESENT, PREDOMINANTLY MONONUCLEAR NO ORGANISMS SEEN    Culture NO GROWTH 3 DAYS  Final   Report Status PENDING  Incomplete  Aerobic/Anaerobic Culture (surgical/deep wound)     Status: None (Preliminary result)   Collection Time: 07/13/15  4:41 PM  Result Value Ref Range Status   Specimen Description TISSUE  Final   Special Requests RIGHT TRICEPS TENDON  Final   Gram Stain   Final    MODERATE WBC  PRESENT, PREDOMINANTLY MONONUCLEAR NO ORGANISMS SEEN    Culture   Final    NO GROWTH 2 DAYS NO ANAEROBES ISOLATED; CULTURE IN PROGRESS FOR 5 DAYS   Report Status PENDING  Incomplete         Radiology Studies: No results found.      Scheduled Meds: . amLODipine  5 mg Oral q morning - 10a  . benazepril  20 mg Oral q morning - 10a  . Chlorhexidine Gluconate Cloth  6 each Topical Q0600  . cholecalciferol  2,000 Units Oral Daily  . docusate sodium  100 mg Oral BID  . fentaNYL  50 mcg Transdermal Q72H  . fluticasone  2 spray Each Nare Daily  . folic acid  1 mg Oral Daily  . levothyroxine  100 mcg Oral QAC breakfast  . mupirocin ointment  1 application Nasal BID  . omega-3 acid ethyl esters  1 g Oral Daily  . pantoprazole  40 mg Oral Daily  . piperacillin-tazobactam (ZOSYN)  IV  3.375 g Intravenous Q8H  . predniSONE  5 mg Oral Daily  . rosuvastatin  10 mg Oral Q72H  . sertraline  50 mg Oral Daily  . vancomycin  750 mg Intravenous Q24H  . vitamin C  1,000 mg Oral Daily   Continuous Infusions: . lactated ringers 50 mL/hr at 07/13/15 1135  . lactated ringers       LOS: 3 days    Time spent: 15 min    Kelvin Cellar, MD [facility] Pager [phone]  If 7PM-7AM, please contact night-coverage [email or web address] Password [email or web address] 07/16/2015, 3:03 PM

## 2015-07-16 NOTE — Progress Notes (Signed)
Patient ID: Jocelyn Bauer, female   DOB: 10-22-1928, 82 y.o.   MRN: [id number] Patient is doing reasonably  VSS AF  NVI RUE with arm in splint  She is tolerating her regular diet. She is voiding well. She notes no locking popping catching numbness or tingling in her lower extremities.  She has no signs of DVT or UTI.  Overall she's doing quite well.  I reviewed her OT findings and it appears that she is making good gains.  Her cultures are negative to date. I will await the cultures to be final prior to discharge and continue antibiotics until then.  If cultures are finalized by tomorrow we will plan for discharge Monday or Tuesday  All questions a been addressed.  Lakeisha Waldrop M.D.

## 2015-07-16 NOTE — Progress Notes (Signed)
Occupational Therapy Treatment Patient Details Name: Jocelyn Bauer MRN: [id number] DOB: September 16, 1928 Today's Date: 07/16/2015    History of present illness 82 y.o. female now s/p revision arthroplasty right elbow. Original replacement performed in the 1990s. PMH: hypertension, Rheumatoid arthritis, anxiety, chronic pain, Lt THA.     OT comments  Pt. Able to complete ambulation to/from b.room with min guard a.  Reports she has adaptive tools at home for hygiene needs.  Still requiring some coaxing and encouragement for participation.  Will plan to attempt ADLS and look closer at any self feeding adaptations for next sessions.    Follow Up Recommendations  Supervision/Assistance - 24 hour    Equipment Recommendations  None recommended by OT    Recommendations for Other Services      Precautions / Restrictions Precautions Precautions: Fall Restrictions RUE Weight Bearing: Non weight bearing       Mobility Bed Mobility               General bed mobility comments: pt. in recliner upon arrival into room  Transfers Overall transfer level: Needs assistance Equipment used: None Transfers: Sit to/from Stand;Stand Pivot Transfers Sit to Stand: Min guard Stand pivot transfers: Min assist            Balance                                   ADL Overall ADL's : Needs assistance/impaired   Eating/Feeding Details (indicate cue type and reason): limited use of LUE from previous sx, and it is not her dominant hand. says "i do the best i can".  family reports they assist with feeding sometimes ? if any adaptations would benefit and increase ability for ind. with self feeding?                     Toilet Transfer: Min guard;Cueing for sequencing;Ambulation;BSC;Regular Glass blower/designer Details (indicate cue type and reason): 3n1 over the commode, cues for pivotal steps and attempts to reach back for armrests before sitting down Toileting- Clothing  Manipulation and Hygiene: Sit to/from stand;Maximal assistance Toileting - Clothing Manipulation Details (indicate cue type and reason): attempted reaching from front and back for pericare after urination.  "i just cant it wont reach".  reports she wraps a washcloth around a LH sponge at home for pericare after toileting     Functional mobility during ADLs: Minimal assistance General ADL Comments: reports "foot spurs and arthritis" as source of pain and limitation with ambulation.  able to mangage amb. to/from b.room with heavy reliance on furniture walking.  required encouragement to continue.  appears to have the abilites but limited with the "want" to attempt at perform.  stating "ive had therapy multiple time ya know, ive been through this a lot".  had to explain multiple times rationale and purpose for why participation was beneficial      Vision                     Perception     Praxis      Cognition   Behavior During Therapy: [facility] for tasks assessed/performed Overall Cognitive Status: Within Functional Limits for tasks assessed                       Extremity/Trunk Assessment  Exercises     Shoulder Instructions       General Comments      Pertinent Vitals/ Pain       Pain Assessment: 0-10 Pain Score: 7  Pain Location: R elbow Pain Descriptors / Indicators: Aching Pain Intervention(s): Limited activity within patient's tolerance;Monitored during session;Repositioned;Patient requesting pain meds-RN notified;RN gave pain meds during session  Home Living                                          Prior Functioning/Environment              Frequency Min 2X/week     Progress Toward Goals  OT Goals(current goals can now be found in the care plan section)  Progress towards OT goals: Progressing toward goals     Plan Discharge plan remains appropriate    Co-evaluation                 End of Session  Equipment Utilized During Treatment: Gait belt   Activity Tolerance     Patient Left in chair;with call bell/phone within reach   Nurse Communication Other (comment) (pt. requested pain meds)        Time: [id number] OT Time Calculation (min): 21 min  Charges: OT General Charges $OT Visit: 1 Procedure OT Treatments $Self Care/Home Management : 8-22 mins  Janice Coffin, COTA/L 07/16/2015, 8:56 AM

## 2015-07-16 NOTE — Care Management (Signed)
Case reviewed for PT question about discharge plan. Presenter, broadcasting BSN CCM

## 2015-07-17 ENCOUNTER — Encounter (HOSPITAL_COMMUNITY): Payer: Self-pay | Admitting: Orthopedic Surgery

## 2015-07-17 LAB — BODY FLUID CULTURE: Culture: NO GROWTH

## 2015-07-17 NOTE — Progress Notes (Signed)
Physical Therapy Treatment Patient Details Name: Jocelyn Bauer MRN: [id number] DOB: Jan 10, 1928 Today's Date: 07/17/2015    History of Present Illness 82 y.o. female now s/p revision arthroplasty right elbow. Original replacement performed in the 1990s. PMH: hypertension, Rheumatoid arthritis, anxiety, chronic pain, Lt THA.      PT Comments    Pt performed increased mobility, trialed stair training for understanding and safety with technique.  Pt progressing well.    Follow Up Recommendations  No PT follow up;Supervision for mobility/OOB     Equipment Recommendations  None recommended by PT    Recommendations for Other Services       Precautions / Restrictions Precautions Precautions: Fall Required Braces or Orthoses: Sling (mission sling per comfort.  ) Restrictions Weight Bearing Restrictions: Yes RUE Weight Bearing: Non weight bearing    Mobility  Bed Mobility               General bed mobility comments: Pt recieved on commode in room.    Transfers Overall transfer level: Needs assistance Equipment used: Straight cane Transfers: Sit to/from Stand Sit to Stand: Supervision Stand pivot transfers: Supervision       General transfer comment: Cues for safety.    Ambulation/Gait Ambulation/Gait assistance: Min guard Ambulation Distance (Feet): 200 Feet Assistive device: Straight cane Gait Pattern/deviations: Step-through pattern;Shuffle;Trunk flexed Gait velocity: decreased   General Gait Details: cues for sequencing of gait with use of AD, forward gaze, and safety awareness as pt became very close to hitting R elbow a few times    Stairs   Stairs assistance: Min guard Stair Management: One rail Left;Forwards;Backwards Number of Stairs: 3 General stair comments: educated pt on sequencing and technique; forward to ascend and backwards to descend; min guard for safety  Wheelchair Mobility    Modified Rankin (Stroke Patients Only)       Balance      Sitting balance-Leahy Scale: Good       Standing balance-Leahy Scale: Fair                      Cognition Arousal/Alertness: Awake/alert Behavior During Therapy: WFL for tasks assessed/performed Overall Cognitive Status: Within Functional Limits for tasks assessed                      Exercises      General Comments        Pertinent Vitals/Pain Pain Assessment: 0-10 Pain Score: 7  Faces Pain Scale: Hurts a little bit Pain Location: R elbow Pain Descriptors / Indicators: Aching Pain Intervention(s): Monitored during session;Repositioned    Home Living                      Prior Function            PT Goals (current goals can now be found in the care plan section) Acute Rehab PT Goals Patient Stated Goal: go home Potential to Achieve Goals: Good Progress towards PT goals: Progressing toward goals    Frequency  Min 3X/week    PT Plan Current plan remains appropriate    Co-evaluation             End of Session Equipment Utilized During Treatment: Gait belt;Other (comment) (mission sling.  ) Activity Tolerance: Patient tolerated treatment well Patient left: with call bell/phone within reach;in bed;with family/visitor present (Rt UE elevated.  )     Time: [id number] PT Time Calculation (min) (ACUTE ONLY): 31 min  Charges:  $Gait Training: 8-22 mins $Therapeutic Activity: 8-22 mins                    G Codes:      Cristela Blue 07-26-15, 4:17 PM  Governor Rooks, PTA pager [phone]

## 2015-07-18 LAB — AEROBIC/ANAEROBIC CULTURE (SURGICAL/DEEP WOUND)

## 2015-07-18 LAB — AEROBIC/ANAEROBIC CULTURE W GRAM STAIN (SURGICAL/DEEP WOUND): Culture: NO GROWTH

## 2015-07-18 NOTE — Progress Notes (Signed)
Patient ID: Jocelyn Bauer, female   DOB: Jul 11, 1928, 82 y.o.   MRN: [id number] Stable at this time VSS AF No changes re right arm She is mobilizing better No signs of DDVT or UTI Awaiting cultures to be final and SNF Discussed with family Trafton Roker MD

## 2015-07-18 NOTE — Progress Notes (Signed)
Patient ID: Jocelyn Bauer, female   DOB: 01/15/1928, 82 y.o.   MRN: [id number] Hand surgery  The patient is seen and evaluated today. She is doing well in regards to the left upper extremity, she is not having significant pain or difficulties. We have discussed with her all tentative discharge instructions as well as her family member who is present today. She will tentatively be discharged tomorrow to [facility] facility. Discussed all issues with the patient at length. Please see tentative discharge summary for July 12.

## 2015-07-18 NOTE — Progress Notes (Signed)
Physical Therapy Treatment Patient Details Name: Jocelyn Bauer MRN: [id number] DOB: February 25, 1928 Today's Date: 07/18/2015    History of Present Illness 82 y.o. female now s/p revision arthroplasty right elbow. Original replacement performed in the 1990s. PMH: hypertension, Rheumatoid arthritis, anxiety, chronic pain, Lt THA.      PT Comments    Pt progressing gait well, remains to require cues for AD and safety.  PT recommendations remain for home.  Pt reports she has bed at rehab waiting.  Per OT notes pt presents with more deficits related to ADLs.    Follow Up Recommendations  No PT follow up;Supervision for mobility/OOB     Equipment Recommendations  None recommended by PT    Recommendations for Other Services       Precautions / Restrictions Precautions Precautions: Fall Required Braces or Orthoses: Sling (mission sling per comfort.  ) Restrictions Weight Bearing Restrictions: Yes RUE Weight Bearing: Non weight bearing    Mobility  Bed Mobility         Supine to sit: HOB elevated     General bed mobility comments: Pt received in chair on arrival.    Transfers Overall transfer level: Needs assistance Equipment used: Straight cane Transfers: Sit to/from Stand Sit to Stand: Modified independent (Device/Increase time) Stand pivot transfers: Modified independent (Device/Increase time)       General transfer comment: Good technique  Ambulation/Gait Ambulation/Gait assistance: Supervision Ambulation Distance (Feet): 220 Feet Assistive device: Straight cane Gait Pattern/deviations: Step-through pattern;Shuffle;Trunk flexed Gait velocity: decreased   General Gait Details: cues for sequencing of gait with use of AD, forward gaze, and safety awareness.   Stairs            Wheelchair Mobility    Modified Rankin (Stroke Patients Only)       Balance Overall balance assessment: Needs assistance   Sitting balance-Leahy Scale: Good       Standing  balance-Leahy Scale: Fair                      Cognition Arousal/Alertness: Awake/alert Behavior During Therapy: WFL for tasks assessed/performed Overall Cognitive Status: Within Functional Limits for tasks assessed                      Exercises      General Comments        Pertinent Vitals/Pain Pain Assessment: Faces Faces Pain Scale: Hurts a little bit Pain Location: R elbow Pain Descriptors / Indicators: Aching;Grimacing;Guarding Pain Intervention(s): Repositioned;Monitored during session    Home Living                      Prior Function            PT Goals (current goals can now be found in the care plan section) Acute Rehab PT Goals Patient Stated Goal: go to rehab today Potential to Achieve Goals: Good Progress towards PT goals: Progressing toward goals    Frequency  Min 3X/week    PT Plan Current plan remains appropriate    Co-evaluation             End of Session Equipment Utilized During Treatment: Gait belt;Other (comment) (mission sling) Activity Tolerance: Patient tolerated treatment well Patient left: with call bell/phone within reach;with family/visitor present;in chair     Time: [id number] PT Time Calculation (min) (ACUTE ONLY): 25 min  Charges:  $Gait Training: 23-37 mins  G Codes:      Cristela Blue 07/18/2015, 1:15 PM Governor Rooks, PTA pager [phone]

## 2015-07-18 NOTE — Progress Notes (Signed)
Occupational Therapy Treatment Patient Details Name: Jocelyn Bauer MRN: [id number] DOB: [birth year] Today's Date: 07/18/2015    History of present illness 82 y.o. female now s/p revision arthroplasty right elbow. Original replacement performed in the 1990s. PMH: hypertension, Rheumatoid arthritis, anxiety, chronic pain, Lt THA.     OT comments  Pt somewhat self limiting. States she is going to SNF and has a bed available. Pt moving about with supervision, pushing IV pole. Performed AROM of R shoulder and fingers and reinforced importance of elevation. Pt continues to require assistance for pericare.   Follow Up Recommendations  SNF;Supervision/Assistance - 24 hour    Equipment Recommendations  None recommended by OT    Recommendations for Other Services      Precautions / Restrictions Precautions Precautions: Fall Required Braces or Orthoses: Sling ([place] sling per comfort) Restrictions RUE Weight Bearing: Non weight bearing       Mobility Bed Mobility Overal bed mobility: Needs Assistance Bed Mobility: Sit to Supine       Sit to supine: Min assist   General bed mobility comments: assist for LEs back into bed, pulled herself up into bed  Transfers Overall transfer level: Needs assistance Equipment used:  (pushed IV pole) Transfers: Sit to/from Stand Sit to Stand: Supervision              Balance     Sitting balance-Leahy Scale: Good       Standing balance-Leahy Scale: Fair                     ADL Overall ADL's : Needs assistance/impaired Eating/Feeding: Set up;Sitting Eating/Feeding Details (indicate cue type and reason): assist to open packages, self feeding with L hand Grooming: Wash/dry hands;Wash/dry face;Standing;Minimal assistance                   Toilet Transfer: Supervision/safety;Ambulation Toilet Transfer Details (indicate cue type and reason): 3 in 1 over toilet Toileting- Clothing Manipulation and Hygiene: Sit to/from  stand;Maximal assistance Toileting - Clothing Manipulation Details (indicate cue type and reason): pt not willing to attempt pericare with L hand     Functional mobility during ADLs: Supervision/safety (pushed IV pole) General ADL Comments: Pt declined performing bathing and dressing with OT, stated she was up all morning and wanted to lay back down.      Vision                     Perception     Praxis      Cognition   Behavior During Therapy: WFL for tasks assessed/performed Overall Cognitive Status: Within Functional Limits for tasks assessed                       Extremity/Trunk Assessment               Exercises  R shoulder x 10, fingers x 10   Shoulder Instructions       General Comments      Pertinent Vitals/ Pain       Pain Assessment: Faces Faces Pain Scale: Hurts a little bit Pain Location: R elbow Pain Descriptors / Indicators: Aching Pain Intervention(s): Monitored during session;Repositioned  Home Living                                          Prior Functioning/Environment  Frequency       Progress Toward Goals  OT Goals(current goals can now be found in the care plan section)  Progress towards OT goals: Progressing toward goals  Acute Rehab OT Goals Patient Stated Goal: go to rehab today Time For Goal Achievement: 07/21/15 Potential to Achieve Goals: Good  Plan Discharge plan needs to be updated    Co-evaluation                 End of Session Equipment Utilized During Treatment: Gait belt   Activity Tolerance Patient tolerated treatment well   Patient Left in bed;with call bell/phone within reach (with R UE elevated on 2 pillows)   Nurse Communication          [id number] OT Time Calculation (min): 17 min  Charges: OT General Charges $OT Visit: 1 Procedure OT Treatments $Self Care/Home Management : 8-22 mins  Malka So 07/18/2015, 9:07 AM   [phone]

## 2015-07-18 NOTE — Discharge Summary (Signed)
Physician Discharge Summary  Patient ID: Jocelyn Bauer MRN: [id number] DOB/AGE: 16-Jul-1928 82 y.o.  Admit date: 07/13/2015 Discharge date:   Admission Diagnoses: RIGHT ELBOW ARTHOPLASTY ASEPTIC LOOSING FAILURE  Past Medical History  Diagnosis Date  . Hypertension   . Shortness of breath     occas  . Arthritis     RA  . Hypothyroidism   . Hyperlipidemia   . GERD (gastroesophageal reflux disease)   . Anxiety     HX OF PANIC ATTACKS  . Osteoporosis   . Headache   . Chronic pain   . Chronic neck and back pain   . Osteoarthritis   . UTI (urinary tract infection)   . Elbow locking     due to RA- elbow with limited extension    Discharge Diagnoses:  Principal Problem:   Right elbow pain Active Problems:   Presence of right artificial elbow joint   Hypertension   Hypothyroidism   Hyperlipidemia   GERD (gastroesophageal reflux disease)   Anxiety   Osteoporosis   Chronic neck and back pain   Surgeries: Procedure(s): RIGHT ELBOW REVISION TOTAL ELBOW ARTHROPLASTY   WITH HARDWARE REMOVAL REPAIR AS NECESSARY  TRICEP RECONSTRUCTION REPAIR WITH ALLOGRAFT, ULNAR NERVE NEUROLYSIS on 07/13/2015    Consultants:    Discharged Condition: Improved  Hospital Course: Jocelyn Bauer is an 82 y.o. female who was admitted 07/13/2015 with a chief complaint of No chief complaint on file. , and found to have a diagnosis of RIGHT ELBOW ARTHOPLASTY ASEPTIC LOOSING FAILURE .  They were brought to the operating room on 07/13/2015 and underwent Procedure(s): RIGHT ELBOW REVISION TOTAL ELBOW ARTHROPLASTY   WITH HARDWARE REMOVAL REPAIR AS NECESSARY  TRICEP RECONSTRUCTION REPAIR WITH ALLOGRAFT, ULNAR NERVE NEUROLYSIS.    They were given perioperative antibiotics: Anti-infectives    Start     Dose/Rate Route Frequency Ordered Stop   07/13/15 2200  vancomycin (VANCOCIN) IVPB 750 mg/150 ml premix     750 mg 150 mL/hr over 60 Minutes Intravenous Every 24 hours 07/13/15 2016 07/17/15 2211   07/13/15  2100  piperacillin-tazobactam (ZOSYN) IVPB 3.375 g     3.375 g 12.5 mL/hr over 240 Minutes Intravenous Every 8 hours 07/13/15 2016 07/17/15 2112   07/13/15 1230  ceFAZolin (ANCEF) IVPB 2g/100 mL premix     2 g 200 mL/hr over 30 Minutes Intravenous To [facility] 07/12/15 1119 07/13/15 1343    .  They were given sequential compression devices, early ambulation.  Recent vital signs: Patient Vitals for the past 24 hrs:  BP Temp Temp src Pulse Resp SpO2  07/18/15 1348 (!) 123/57 mmHg 98.4 F (36.9 C) - 90 16 98 %  07/18/15 0958 (!) 112/50 mmHg - - 82 - -  07/18/15 0500 (!) 127/51 mmHg 97.6 F (36.4 C) Oral 75 16 96 %  07/17/15 2038 (!) 127/46 mmHg 98.9 F (37.2 C) Oral 82 17 92 %  .  Recent laboratory studies: No results found.  Discharge Medications:     Medication List    STOP taking these medications        methotrexate 2.5 MG tablet  Commonly known as:  RHEUMATREX      TAKE these medications        amLODipine 5 MG tablet  Commonly known as:  NORVASC  Take 5 mg by mouth every morning.     ARTIFICIAL TEAR OP  Apply 1 drop to eye 2 (two) times daily as needed (DRY EYES).  benazepril 20 MG tablet  Commonly known as:  LOTENSIN  Take 20 mg by mouth every morning.     cholecalciferol 1000 units tablet  Commonly known as:  VITAMIN D  Take 2,000 Units by mouth daily.     denosumab 60 MG/ML Soln injection  Commonly known as:  PROLIA  Inject 60 mg into the skin every 6 (six) months. Administer in upper arm, thigh, or abdomen     fentaNYL 50 MCG/HR  Commonly known as:  DURAGESIC - dosed mcg/hr  Place 50 mcg onto the skin every 3 (three) days.     fexofenadine 180 MG tablet  Commonly known as:  ALLEGRA  Take 180 mg by mouth daily as needed (For allergies.).     Fish Oil 1000 MG Caps  Take 1 capsule by mouth daily.     fluticasone 50 MCG/ACT nasal spray  Commonly known as:  FLONASE  Place 2 sprays into the nose daily.     folic acid 1 MG tablet   Commonly known as:  FOLVITE  Take 1 mg by mouth daily.     furosemide 20 MG tablet  Commonly known as:  LASIX  Take 20 mg by mouth every other day as needed for fluid (For ankle swelling).     levothyroxine 100 MCG tablet  Commonly known as:  SYNTHROID, LEVOTHROID  Take 100 mcg by mouth daily before breakfast.     LORazepam 0.5 MG tablet  Commonly known as:  ATIVAN  Take 0.25-5 mg by mouth 2 (two) times daily as needed for anxiety.     omeprazole 20 MG capsule  Commonly known as:  PRILOSEC  Take 20 mg by mouth every other day.     oxyCODONE-acetaminophen 10-325 MG tablet  Commonly known as:  PERCOCET  Take 1 tablet by mouth every 8 (eight) hours as needed for pain. Pain     predniSONE 5 MG tablet  Commonly known as:  DELTASONE  Take 1 tablet (5 mg total) by mouth daily.     rosuvastatin 20 MG tablet  Commonly known as:  CRESTOR  Take 10 mg by mouth every 3 (three) days.     sertraline 50 MG tablet  Commonly known as:  ZOLOFT  Take 50 mg by mouth daily.        Diagnostic Studies: Ct Head Wo Contrast  06/19/2015  CLINICAL DATA:  Neck and occipital pain for 1 week. No known injury. EXAM: CT HEAD WITHOUT CONTRAST CT CERVICAL SPINE WITHOUT CONTRAST TECHNIQUE: Multidetector CT imaging of the head and cervical spine was performed following the standard protocol without intravenous contrast. Multiplanar CT image reconstructions of the cervical spine were also generated. COMPARISON:  Head CT scan 09/07/2012. FINDINGS: CT HEAD FINDINGS Scattered hypoattenuation in the subcortical and periventricular deep white matter is consistent chronic microvascular ischemic change. No evidence of acute intracranial abnormality including hemorrhage, infarct, mass lesion, mass effect, midline shift or abnormal extra-axial fluid collection is identified. No hydrocephalus or pneumocephalus. The calvarium is intact. Imaged paranasal sinuses and mastoid air cells are clear. CT CERVICAL SPINE FINDINGS No  fracture or malalignment is identified. Advanced degenerative change is seen about the C1-2 articulation. Scattered facet degenerative disease appears worst on the left at C2-3 and on the right at C3-4. The lung apices are clear. IMPRESSION: No acute abnormality head or cervical spine. Chronic microvascular ischemic change. Cervical spondylosis. Electronically Signed   By: Inge Rise M.D.   On: 06/19/2015 10:38   Ct Cervical Spine Wo  Contrast  06/19/2015  CLINICAL DATA:  Neck and occipital pain for 1 week. No known injury. EXAM: CT HEAD WITHOUT CONTRAST CT CERVICAL SPINE WITHOUT CONTRAST TECHNIQUE: Multidetector CT imaging of the head and cervical spine was performed following the standard protocol without intravenous contrast. Multiplanar CT image reconstructions of the cervical spine were also generated. COMPARISON:  Head CT scan 09/07/2012. FINDINGS: CT HEAD FINDINGS Scattered hypoattenuation in the subcortical and periventricular deep white matter is consistent chronic microvascular ischemic change. No evidence of acute intracranial abnormality including hemorrhage, infarct, mass lesion, mass effect, midline shift or abnormal extra-axial fluid collection is identified. No hydrocephalus or pneumocephalus. The calvarium is intact. Imaged paranasal sinuses and mastoid air cells are clear. CT CERVICAL SPINE FINDINGS No fracture or malalignment is identified. Advanced degenerative change is seen about the C1-2 articulation. Scattered facet degenerative disease appears worst on the left at C2-3 and on the right at C3-4. The lung apices are clear. IMPRESSION: No acute abnormality head or cervical spine. Chronic microvascular ischemic change. Cervical spondylosis. Electronically Signed   By: Inge Rise M.D.   On: 06/19/2015 10:38    They benefited maximally from their hospital stay and there were no complications.     Disposition: 01-Home or Self Care     Discharge Instructions    Call MD / Call  911    Complete by:  As directed   If you experience chest pain or shortness of breath, CALL 911 and be transported to the hospital emergency room.  If you develope a fever above 101 F, pus (white drainage) or increased drainage or redness at the wound, or calf pain, call your surgeon's office.     Constipation Prevention    Complete by:  As directed   Drink plenty of fluids.  Prune juice may be helpful.  You may use a stool softener, such as Colace (over the counter) 100 mg twice a day.  Use MiraLax (over the counter) for constipation as needed.     Diet - low sodium heart healthy    Complete by:  As directed      Discharge instructions    Complete by:  As directed   Do not resume methotrexate and tilt 2 weeks postop. Keep bandage clean and dry.  Call for any problems.  No smoking.  Criteria for driving a car: you should be off your pain medicine for 7-8 hours, able to drive one handed(confident), thinking clearly and feeling able in your judgement to drive. Continue elevation as it will decrease swelling.  If instructed by MD move your fingers within the confines of the bandage/splint.  Use ice if instructed by your MD. Call immediately for any sudden loss of feeling in your hand/arm or change in functional abilities of the extremity. We recommend that you to take vitamin C 1000 mg a day to promote healing. We also recommend that if you require  pain medicine that you take a stool softener to prevent constipation as most pain medicines will have constipation side effects. We recommend either Peri-Colace or Senokot and recommend that you also consider adding MiraLAX as well to prevent the constipation affects from pain medicine if you are required to use them. These medicines are over the counter and may be purchased at a local pharmacy. A cup of yogurt and a probiotic can also be helpful during the recovery process as the medicines can disrupt your intestinal environment.     Increase activity slowly  as tolerated  Complete by:  As directed           Follow-up Information    Follow up with Paulene Floor, MD. Schedule an appointment as soon as possible for a visit in 10 days.   Specialty:  Orthopedic Surgery   Why:  For suture removal, For wound re-check   Contact information:   [address] [place] [address] [id number]        Signed: Ivan Croft 07/18/2015, 7:52 PM

## 2015-07-18 NOTE — Discharge Instructions (Signed)
We recommend that you to take vitamin C 1000 mg a day to promote healing. We also recommend that if you require  pain medicine that you take a stool softener to prevent constipation as most pain medicines will have constipation side effects. We recommend either Peri-Colace or Senokot and recommend that you also consider adding MiraLAX as well to prevent the constipation affects from pain medicine if you are required to use them. These medicines are over the counter and may be purchased at a local pharmacy. A cup of yogurt and a probiotic can also be helpful during the recovery process as the medicines can disrupt your intestinal environment. Keep bandage clean and dry.  Call for any problems.  No smoking.  Criteria for driving a car: you should be off your pain medicine for 7-8 hours, able to drive one handed(confident), thinking clearly and feeling able in your judgement to drive. Continue elevation as it will decrease swelling.  If instructed by MD move your fingers within the confines of the bandage/splint.  Use ice if instructed by your MD. Call immediately for any sudden loss of feeling in your hand/arm or change in functional abilities of the extremity. Hold methotrexate for 2 weeks. In regards [facility], patient will be nonweightbearing to the operative extremity, please elevate the extremity frequently and encourage frequent finger range of motion, massage and edema control. Keep the dressings clean dry and intact and do not remove them.

## 2015-07-19 DIAGNOSIS — M542 Cervicalgia: Secondary | ICD-10-CM | POA: Diagnosis not present

## 2015-07-19 DIAGNOSIS — Z4789 Encounter for other orthopedic aftercare: Secondary | ICD-10-CM | POA: Diagnosis not present

## 2015-07-19 DIAGNOSIS — F419 Anxiety disorder, unspecified: Secondary | ICD-10-CM | POA: Diagnosis not present

## 2015-07-19 DIAGNOSIS — E785 Hyperlipidemia, unspecified: Secondary | ICD-10-CM | POA: Diagnosis not present

## 2015-07-19 DIAGNOSIS — H04129 Dry eye syndrome of unspecified lacrimal gland: Secondary | ICD-10-CM | POA: Diagnosis not present

## 2015-07-19 DIAGNOSIS — M549 Dorsalgia, unspecified: Secondary | ICD-10-CM | POA: Diagnosis not present

## 2015-07-19 DIAGNOSIS — T84038D Mechanical loosening of other internal prosthetic joint, subsequent encounter: Secondary | ICD-10-CM | POA: Diagnosis not present

## 2015-07-19 DIAGNOSIS — K219 Gastro-esophageal reflux disease without esophagitis: Secondary | ICD-10-CM | POA: Diagnosis not present

## 2015-07-19 DIAGNOSIS — K5900 Constipation, unspecified: Secondary | ICD-10-CM | POA: Diagnosis not present

## 2015-07-19 DIAGNOSIS — M6281 Muscle weakness (generalized): Secondary | ICD-10-CM | POA: Diagnosis not present

## 2015-07-19 DIAGNOSIS — I10 Essential (primary) hypertension: Secondary | ICD-10-CM | POA: Diagnosis not present

## 2015-07-19 DIAGNOSIS — E039 Hypothyroidism, unspecified: Secondary | ICD-10-CM | POA: Diagnosis not present

## 2015-07-19 DIAGNOSIS — M810 Age-related osteoporosis without current pathological fracture: Secondary | ICD-10-CM | POA: Diagnosis not present

## 2015-07-19 DIAGNOSIS — H6120 Impacted cerumen, unspecified ear: Secondary | ICD-10-CM | POA: Diagnosis not present

## 2015-07-19 DIAGNOSIS — M1990 Unspecified osteoarthritis, unspecified site: Secondary | ICD-10-CM | POA: Diagnosis not present

## 2015-07-19 DIAGNOSIS — J309 Allergic rhinitis, unspecified: Secondary | ICD-10-CM | POA: Diagnosis not present

## 2015-07-19 DIAGNOSIS — F329 Major depressive disorder, single episode, unspecified: Secondary | ICD-10-CM | POA: Diagnosis not present

## 2015-07-19 DIAGNOSIS — Z87891 Personal history of nicotine dependence: Secondary | ICD-10-CM | POA: Diagnosis not present

## 2015-07-19 DIAGNOSIS — G8929 Other chronic pain: Secondary | ICD-10-CM | POA: Diagnosis not present

## 2015-07-19 DIAGNOSIS — M069 Rheumatoid arthritis, unspecified: Secondary | ICD-10-CM | POA: Diagnosis not present

## 2015-07-19 DIAGNOSIS — M25529 Pain in unspecified elbow: Secondary | ICD-10-CM | POA: Diagnosis not present

## 2015-07-19 DIAGNOSIS — Z96621 Presence of right artificial elbow joint: Secondary | ICD-10-CM | POA: Diagnosis not present

## 2015-07-19 LAB — ANAEROBIC CULTURE

## 2015-07-19 NOTE — Clinical Social Work Note (Signed)
Patient to be discharged to [facility]. Patient and patient's son updated regarding discharge. Patient to be transported via [facility]. RN report number: [place], [place] [facility]: [phone] Surgical: [phone]

## 2015-07-19 NOTE — Progress Notes (Signed)
Report called to [facility]. Pt discharged via stretcher transport with son accompanying. All personal belongings with pt. No distress noted from patient, no c/o.

## 2015-07-19 NOTE — Clinical Social Work Placement (Signed)
   CLINICAL SOCIAL WORK PLACEMENT  NOTE  Date:  07/19/2015  Patient Details  Name: Jocelyn Bauer MRN: [id number] Date of Birth: October 25, 1928  Clinical Social Work is seeking post-discharge placement for this patient at the [place] level of care (*CSW will initial, date and re-position this form in  chart as items are completed):  Yes   Patient/family provided with [place] Work Department's list of facilities offering this level of care within the geographic area requested by the patient (or if unable, by the patient's family).  Yes   Patient/family informed of their freedom to choose among providers that offer the needed level of care, that participate in Medicare, Medicaid or managed care program needed by the patient, have an available bed and are willing to accept the patient.  Yes   Patient/family informed of [facility]'s ownership interest in [facility] and [facility], as well as of the fact that they are under no obligation to receive care at these facilities.  PASRR submitted to EDS on       PASRR number received on       Existing PASRR number confirmed on 07/15/15     FL2 transmitted to all facilities in geographic area requested by pt/family on 07/15/15     FL2 transmitted to all facilities within larger geographic area on       Patient informed that his/her managed care company has contracts with or will negotiate with certain facilities, including the following:        Yes   Patient/family informed of bed offers received.  Patient chooses bed at [facility]     Physician recommends and patient chooses bed at      Patient to be transferred to [facility] on 07/19/15.  Patient to be transferred to facility by [facility]     Patient family notified on 07/19/15 of transfer.  Name of family member notified:  Patient and patient's son.     PHYSICIAN Please prepare priority discharge summary, including medications,  Please prepare prescriptions, Please sign FL2     Additional Comment:    _______________________________________________ Caroline Sauger, LCSW 07/19/2015, 11:09 AM

## 2015-07-19 NOTE — Progress Notes (Signed)
Patient ID: Jocelyn Bauer, female   DOB: Jul 21, 1928, 82 y.o.   MRN: [id number] Hand surgery The patient is awake, up in her chair this morning. She is doing well. He has no complaints this morning and [facility]. Evaluation of the left upper extremity shows she has mild edema about the digits her sensation is intact her splint is clean dry and intact. We have discussed with her all issues she will be discharged today. Please see discharge summary.

## 2015-07-27 DIAGNOSIS — Z4789 Encounter for other orthopedic aftercare: Secondary | ICD-10-CM | POA: Diagnosis not present

## 2015-08-09 DIAGNOSIS — I129 Hypertensive chronic kidney disease with stage 1 through stage 4 chronic kidney disease, or unspecified chronic kidney disease: Secondary | ICD-10-CM | POA: Diagnosis not present

## 2015-08-09 DIAGNOSIS — M810 Age-related osteoporosis without current pathological fracture: Secondary | ICD-10-CM | POA: Diagnosis not present

## 2015-08-09 DIAGNOSIS — G894 Chronic pain syndrome: Secondary | ICD-10-CM | POA: Diagnosis not present

## 2015-08-09 DIAGNOSIS — F329 Major depressive disorder, single episode, unspecified: Secondary | ICD-10-CM | POA: Diagnosis not present

## 2015-08-09 DIAGNOSIS — E039 Hypothyroidism, unspecified: Secondary | ICD-10-CM | POA: Diagnosis not present

## 2015-08-09 DIAGNOSIS — T84038D Mechanical loosening of other internal prosthetic joint, subsequent encounter: Secondary | ICD-10-CM | POA: Diagnosis not present

## 2015-08-09 DIAGNOSIS — N183 Chronic kidney disease, stage 3 (moderate): Secondary | ICD-10-CM | POA: Diagnosis not present

## 2015-08-09 DIAGNOSIS — Z96622 Presence of left artificial elbow joint: Secondary | ICD-10-CM | POA: Diagnosis not present

## 2015-08-09 DIAGNOSIS — F419 Anxiety disorder, unspecified: Secondary | ICD-10-CM | POA: Diagnosis not present

## 2015-08-09 DIAGNOSIS — Z7952 Long term (current) use of systemic steroids: Secondary | ICD-10-CM | POA: Diagnosis not present

## 2015-08-10 DIAGNOSIS — F419 Anxiety disorder, unspecified: Secondary | ICD-10-CM | POA: Diagnosis not present

## 2015-08-10 DIAGNOSIS — N183 Chronic kidney disease, stage 3 (moderate): Secondary | ICD-10-CM | POA: Diagnosis not present

## 2015-08-10 DIAGNOSIS — G894 Chronic pain syndrome: Secondary | ICD-10-CM | POA: Diagnosis not present

## 2015-08-10 DIAGNOSIS — M810 Age-related osteoporosis without current pathological fracture: Secondary | ICD-10-CM | POA: Diagnosis not present

## 2015-08-10 DIAGNOSIS — T84038D Mechanical loosening of other internal prosthetic joint, subsequent encounter: Secondary | ICD-10-CM | POA: Diagnosis not present

## 2015-08-10 DIAGNOSIS — I129 Hypertensive chronic kidney disease with stage 1 through stage 4 chronic kidney disease, or unspecified chronic kidney disease: Secondary | ICD-10-CM | POA: Diagnosis not present

## 2015-08-14 DIAGNOSIS — M542 Cervicalgia: Secondary | ICD-10-CM | POA: Diagnosis not present

## 2015-08-14 DIAGNOSIS — N183 Chronic kidney disease, stage 3 (moderate): Secondary | ICD-10-CM | POA: Diagnosis not present

## 2015-08-14 DIAGNOSIS — T84038D Mechanical loosening of other internal prosthetic joint, subsequent encounter: Secondary | ICD-10-CM | POA: Diagnosis not present

## 2015-08-14 DIAGNOSIS — F419 Anxiety disorder, unspecified: Secondary | ICD-10-CM | POA: Diagnosis not present

## 2015-08-14 DIAGNOSIS — Z6831 Body mass index (BMI) 31.0-31.9, adult: Secondary | ICD-10-CM | POA: Diagnosis not present

## 2015-08-14 DIAGNOSIS — J301 Allergic rhinitis due to pollen: Secondary | ICD-10-CM | POA: Diagnosis not present

## 2015-08-14 DIAGNOSIS — I129 Hypertensive chronic kidney disease with stage 1 through stage 4 chronic kidney disease, or unspecified chronic kidney disease: Secondary | ICD-10-CM | POA: Diagnosis not present

## 2015-08-14 DIAGNOSIS — M810 Age-related osteoporosis without current pathological fracture: Secondary | ICD-10-CM | POA: Diagnosis not present

## 2015-08-14 DIAGNOSIS — H6123 Impacted cerumen, bilateral: Secondary | ICD-10-CM | POA: Diagnosis not present

## 2015-08-14 DIAGNOSIS — G894 Chronic pain syndrome: Secondary | ICD-10-CM | POA: Diagnosis not present

## 2015-08-15 DIAGNOSIS — T84038D Mechanical loosening of other internal prosthetic joint, subsequent encounter: Secondary | ICD-10-CM | POA: Diagnosis not present

## 2015-08-15 DIAGNOSIS — G894 Chronic pain syndrome: Secondary | ICD-10-CM | POA: Diagnosis not present

## 2015-08-15 DIAGNOSIS — M810 Age-related osteoporosis without current pathological fracture: Secondary | ICD-10-CM | POA: Diagnosis not present

## 2015-08-15 DIAGNOSIS — N183 Chronic kidney disease, stage 3 (moderate): Secondary | ICD-10-CM | POA: Diagnosis not present

## 2015-08-15 DIAGNOSIS — I129 Hypertensive chronic kidney disease with stage 1 through stage 4 chronic kidney disease, or unspecified chronic kidney disease: Secondary | ICD-10-CM | POA: Diagnosis not present

## 2015-08-15 DIAGNOSIS — F419 Anxiety disorder, unspecified: Secondary | ICD-10-CM | POA: Diagnosis not present

## 2015-08-16 DIAGNOSIS — N183 Chronic kidney disease, stage 3 (moderate): Secondary | ICD-10-CM | POA: Diagnosis not present

## 2015-08-16 DIAGNOSIS — G894 Chronic pain syndrome: Secondary | ICD-10-CM | POA: Diagnosis not present

## 2015-08-16 DIAGNOSIS — T84038D Mechanical loosening of other internal prosthetic joint, subsequent encounter: Secondary | ICD-10-CM | POA: Diagnosis not present

## 2015-08-16 DIAGNOSIS — I129 Hypertensive chronic kidney disease with stage 1 through stage 4 chronic kidney disease, or unspecified chronic kidney disease: Secondary | ICD-10-CM | POA: Diagnosis not present

## 2015-08-16 DIAGNOSIS — M810 Age-related osteoporosis without current pathological fracture: Secondary | ICD-10-CM | POA: Diagnosis not present

## 2015-08-16 DIAGNOSIS — F419 Anxiety disorder, unspecified: Secondary | ICD-10-CM | POA: Diagnosis not present

## 2015-08-17 DIAGNOSIS — I129 Hypertensive chronic kidney disease with stage 1 through stage 4 chronic kidney disease, or unspecified chronic kidney disease: Secondary | ICD-10-CM | POA: Diagnosis not present

## 2015-08-17 DIAGNOSIS — N183 Chronic kidney disease, stage 3 (moderate): Secondary | ICD-10-CM | POA: Diagnosis not present

## 2015-08-17 DIAGNOSIS — G894 Chronic pain syndrome: Secondary | ICD-10-CM | POA: Diagnosis not present

## 2015-08-17 DIAGNOSIS — T84038D Mechanical loosening of other internal prosthetic joint, subsequent encounter: Secondary | ICD-10-CM | POA: Diagnosis not present

## 2015-08-17 DIAGNOSIS — M810 Age-related osteoporosis without current pathological fracture: Secondary | ICD-10-CM | POA: Diagnosis not present

## 2015-08-17 DIAGNOSIS — F419 Anxiety disorder, unspecified: Secondary | ICD-10-CM | POA: Diagnosis not present

## 2015-08-21 DIAGNOSIS — Z4789 Encounter for other orthopedic aftercare: Secondary | ICD-10-CM | POA: Diagnosis not present

## 2015-08-22 DIAGNOSIS — G894 Chronic pain syndrome: Secondary | ICD-10-CM | POA: Diagnosis not present

## 2015-08-22 DIAGNOSIS — M810 Age-related osteoporosis without current pathological fracture: Secondary | ICD-10-CM | POA: Diagnosis not present

## 2015-08-22 DIAGNOSIS — I129 Hypertensive chronic kidney disease with stage 1 through stage 4 chronic kidney disease, or unspecified chronic kidney disease: Secondary | ICD-10-CM | POA: Diagnosis not present

## 2015-08-22 DIAGNOSIS — F419 Anxiety disorder, unspecified: Secondary | ICD-10-CM | POA: Diagnosis not present

## 2015-08-22 DIAGNOSIS — T84038D Mechanical loosening of other internal prosthetic joint, subsequent encounter: Secondary | ICD-10-CM | POA: Diagnosis not present

## 2015-08-22 DIAGNOSIS — N183 Chronic kidney disease, stage 3 (moderate): Secondary | ICD-10-CM | POA: Diagnosis not present

## 2015-08-24 DIAGNOSIS — G894 Chronic pain syndrome: Secondary | ICD-10-CM | POA: Diagnosis not present

## 2015-08-24 DIAGNOSIS — M810 Age-related osteoporosis without current pathological fracture: Secondary | ICD-10-CM | POA: Diagnosis not present

## 2015-08-24 DIAGNOSIS — I129 Hypertensive chronic kidney disease with stage 1 through stage 4 chronic kidney disease, or unspecified chronic kidney disease: Secondary | ICD-10-CM | POA: Diagnosis not present

## 2015-08-24 DIAGNOSIS — N183 Chronic kidney disease, stage 3 (moderate): Secondary | ICD-10-CM | POA: Diagnosis not present

## 2015-08-24 DIAGNOSIS — T84038D Mechanical loosening of other internal prosthetic joint, subsequent encounter: Secondary | ICD-10-CM | POA: Diagnosis not present

## 2015-08-24 DIAGNOSIS — F419 Anxiety disorder, unspecified: Secondary | ICD-10-CM | POA: Diagnosis not present

## 2015-08-29 DIAGNOSIS — I129 Hypertensive chronic kidney disease with stage 1 through stage 4 chronic kidney disease, or unspecified chronic kidney disease: Secondary | ICD-10-CM | POA: Diagnosis not present

## 2015-08-29 DIAGNOSIS — M810 Age-related osteoporosis without current pathological fracture: Secondary | ICD-10-CM | POA: Diagnosis not present

## 2015-08-29 DIAGNOSIS — T84038D Mechanical loosening of other internal prosthetic joint, subsequent encounter: Secondary | ICD-10-CM | POA: Diagnosis not present

## 2015-08-29 DIAGNOSIS — F419 Anxiety disorder, unspecified: Secondary | ICD-10-CM | POA: Diagnosis not present

## 2015-08-29 DIAGNOSIS — G894 Chronic pain syndrome: Secondary | ICD-10-CM | POA: Diagnosis not present

## 2015-08-29 DIAGNOSIS — N183 Chronic kidney disease, stage 3 (moderate): Secondary | ICD-10-CM | POA: Diagnosis not present

## 2015-08-31 DIAGNOSIS — N183 Chronic kidney disease, stage 3 (moderate): Secondary | ICD-10-CM | POA: Diagnosis not present

## 2015-08-31 DIAGNOSIS — I129 Hypertensive chronic kidney disease with stage 1 through stage 4 chronic kidney disease, or unspecified chronic kidney disease: Secondary | ICD-10-CM | POA: Diagnosis not present

## 2015-08-31 DIAGNOSIS — F419 Anxiety disorder, unspecified: Secondary | ICD-10-CM | POA: Diagnosis not present

## 2015-08-31 DIAGNOSIS — T84038D Mechanical loosening of other internal prosthetic joint, subsequent encounter: Secondary | ICD-10-CM | POA: Diagnosis not present

## 2015-08-31 DIAGNOSIS — M810 Age-related osteoporosis without current pathological fracture: Secondary | ICD-10-CM | POA: Diagnosis not present

## 2015-08-31 DIAGNOSIS — G894 Chronic pain syndrome: Secondary | ICD-10-CM | POA: Diagnosis not present

## 2015-09-05 DIAGNOSIS — M810 Age-related osteoporosis without current pathological fracture: Secondary | ICD-10-CM | POA: Diagnosis not present

## 2015-09-05 DIAGNOSIS — T84038D Mechanical loosening of other internal prosthetic joint, subsequent encounter: Secondary | ICD-10-CM | POA: Diagnosis not present

## 2015-09-05 DIAGNOSIS — N183 Chronic kidney disease, stage 3 (moderate): Secondary | ICD-10-CM | POA: Diagnosis not present

## 2015-09-05 DIAGNOSIS — F419 Anxiety disorder, unspecified: Secondary | ICD-10-CM | POA: Diagnosis not present

## 2015-09-05 DIAGNOSIS — I129 Hypertensive chronic kidney disease with stage 1 through stage 4 chronic kidney disease, or unspecified chronic kidney disease: Secondary | ICD-10-CM | POA: Diagnosis not present

## 2015-09-05 DIAGNOSIS — G894 Chronic pain syndrome: Secondary | ICD-10-CM | POA: Diagnosis not present

## 2015-09-06 DIAGNOSIS — N183 Chronic kidney disease, stage 3 (moderate): Secondary | ICD-10-CM | POA: Diagnosis not present

## 2015-09-06 DIAGNOSIS — F419 Anxiety disorder, unspecified: Secondary | ICD-10-CM | POA: Diagnosis not present

## 2015-09-06 DIAGNOSIS — M810 Age-related osteoporosis without current pathological fracture: Secondary | ICD-10-CM | POA: Diagnosis not present

## 2015-09-06 DIAGNOSIS — I129 Hypertensive chronic kidney disease with stage 1 through stage 4 chronic kidney disease, or unspecified chronic kidney disease: Secondary | ICD-10-CM | POA: Diagnosis not present

## 2015-09-06 DIAGNOSIS — G894 Chronic pain syndrome: Secondary | ICD-10-CM | POA: Diagnosis not present

## 2015-09-06 DIAGNOSIS — T84038D Mechanical loosening of other internal prosthetic joint, subsequent encounter: Secondary | ICD-10-CM | POA: Diagnosis not present

## 2015-09-07 DIAGNOSIS — I129 Hypertensive chronic kidney disease with stage 1 through stage 4 chronic kidney disease, or unspecified chronic kidney disease: Secondary | ICD-10-CM | POA: Diagnosis not present

## 2015-09-07 DIAGNOSIS — T84038D Mechanical loosening of other internal prosthetic joint, subsequent encounter: Secondary | ICD-10-CM | POA: Diagnosis not present

## 2015-09-07 DIAGNOSIS — N183 Chronic kidney disease, stage 3 (moderate): Secondary | ICD-10-CM | POA: Diagnosis not present

## 2015-09-07 DIAGNOSIS — F419 Anxiety disorder, unspecified: Secondary | ICD-10-CM | POA: Diagnosis not present

## 2015-09-07 DIAGNOSIS — G894 Chronic pain syndrome: Secondary | ICD-10-CM | POA: Diagnosis not present

## 2015-09-07 DIAGNOSIS — M810 Age-related osteoporosis without current pathological fracture: Secondary | ICD-10-CM | POA: Diagnosis not present

## 2015-09-12 DIAGNOSIS — F419 Anxiety disorder, unspecified: Secondary | ICD-10-CM | POA: Diagnosis not present

## 2015-09-12 DIAGNOSIS — G894 Chronic pain syndrome: Secondary | ICD-10-CM | POA: Diagnosis not present

## 2015-09-12 DIAGNOSIS — N183 Chronic kidney disease, stage 3 (moderate): Secondary | ICD-10-CM | POA: Diagnosis not present

## 2015-09-12 DIAGNOSIS — M810 Age-related osteoporosis without current pathological fracture: Secondary | ICD-10-CM | POA: Diagnosis not present

## 2015-09-12 DIAGNOSIS — I129 Hypertensive chronic kidney disease with stage 1 through stage 4 chronic kidney disease, or unspecified chronic kidney disease: Secondary | ICD-10-CM | POA: Diagnosis not present

## 2015-09-12 DIAGNOSIS — T84038D Mechanical loosening of other internal prosthetic joint, subsequent encounter: Secondary | ICD-10-CM | POA: Diagnosis not present

## 2015-09-18 DIAGNOSIS — T84038D Mechanical loosening of other internal prosthetic joint, subsequent encounter: Secondary | ICD-10-CM | POA: Diagnosis not present

## 2015-09-18 DIAGNOSIS — N183 Chronic kidney disease, stage 3 (moderate): Secondary | ICD-10-CM | POA: Diagnosis not present

## 2015-09-18 DIAGNOSIS — F419 Anxiety disorder, unspecified: Secondary | ICD-10-CM | POA: Diagnosis not present

## 2015-09-18 DIAGNOSIS — I129 Hypertensive chronic kidney disease with stage 1 through stage 4 chronic kidney disease, or unspecified chronic kidney disease: Secondary | ICD-10-CM | POA: Diagnosis not present

## 2015-09-18 DIAGNOSIS — G894 Chronic pain syndrome: Secondary | ICD-10-CM | POA: Diagnosis not present

## 2015-09-18 DIAGNOSIS — M810 Age-related osteoporosis without current pathological fracture: Secondary | ICD-10-CM | POA: Diagnosis not present

## 2015-10-02 DIAGNOSIS — M810 Age-related osteoporosis without current pathological fracture: Secondary | ICD-10-CM | POA: Diagnosis not present

## 2015-10-02 DIAGNOSIS — G894 Chronic pain syndrome: Secondary | ICD-10-CM | POA: Diagnosis not present

## 2015-10-02 DIAGNOSIS — N183 Chronic kidney disease, stage 3 (moderate): Secondary | ICD-10-CM | POA: Diagnosis not present

## 2015-10-02 DIAGNOSIS — F419 Anxiety disorder, unspecified: Secondary | ICD-10-CM | POA: Diagnosis not present

## 2015-10-02 DIAGNOSIS — I129 Hypertensive chronic kidney disease with stage 1 through stage 4 chronic kidney disease, or unspecified chronic kidney disease: Secondary | ICD-10-CM | POA: Diagnosis not present

## 2015-10-02 DIAGNOSIS — T84038D Mechanical loosening of other internal prosthetic joint, subsequent encounter: Secondary | ICD-10-CM | POA: Diagnosis not present

## 2015-10-03 DIAGNOSIS — Z4789 Encounter for other orthopedic aftercare: Secondary | ICD-10-CM | POA: Diagnosis not present

## 2015-10-03 DIAGNOSIS — M25521 Pain in right elbow: Secondary | ICD-10-CM | POA: Diagnosis not present

## 2015-10-17 DIAGNOSIS — Z23 Encounter for immunization: Secondary | ICD-10-CM | POA: Diagnosis not present

## 2015-12-19 MED ORDER — DENOSUMAB 60 MG/ML ~~LOC~~ SOLN: 60.0000 mg | Freq: Once | SUBCUTANEOUS | Status: DC

## 2015-12-22 ENCOUNTER — Ambulatory Visit (HOSPITAL_COMMUNITY)
Admission: RE | Admit: 2015-12-22 | Discharge: 2015-12-22 | Disposition: A | Discharge status: Home / Self Care | Payer: Medicare Other | Source: Ambulatory Visit | Attending: Internal Medicine | Admitting: Internal Medicine

## 2016-02-15 DIAGNOSIS — E559 Vitamin D deficiency, unspecified: Secondary | ICD-10-CM | POA: Diagnosis not present

## 2016-02-15 DIAGNOSIS — M810 Age-related osteoporosis without current pathological fracture: Secondary | ICD-10-CM | POA: Diagnosis not present

## 2016-02-15 DIAGNOSIS — E038 Other specified hypothyroidism: Secondary | ICD-10-CM | POA: Diagnosis not present

## 2016-03-14 DIAGNOSIS — E559 Vitamin D deficiency, unspecified: Secondary | ICD-10-CM | POA: Diagnosis not present

## 2016-03-14 DIAGNOSIS — I10 Essential (primary) hypertension: Secondary | ICD-10-CM | POA: Diagnosis not present

## 2016-03-14 DIAGNOSIS — E784 Other hyperlipidemia: Secondary | ICD-10-CM | POA: Diagnosis not present

## 2016-03-14 DIAGNOSIS — E038 Other specified hypothyroidism: Secondary | ICD-10-CM | POA: Diagnosis not present

## 2016-03-19 DIAGNOSIS — R600 Localized edema: Secondary | ICD-10-CM | POA: Diagnosis not present

## 2016-03-19 DIAGNOSIS — M810 Age-related osteoporosis without current pathological fracture: Secondary | ICD-10-CM | POA: Diagnosis not present

## 2016-03-19 DIAGNOSIS — F410 Panic disorder [episodic paroxysmal anxiety] without agoraphobia: Secondary | ICD-10-CM | POA: Diagnosis not present

## 2016-03-19 DIAGNOSIS — M542 Cervicalgia: Secondary | ICD-10-CM | POA: Diagnosis not present

## 2016-03-19 DIAGNOSIS — M0689 Other specified rheumatoid arthritis, multiple sites: Secondary | ICD-10-CM | POA: Diagnosis not present

## 2016-03-19 DIAGNOSIS — R918 Other nonspecific abnormal finding of lung field: Secondary | ICD-10-CM | POA: Diagnosis not present

## 2016-03-19 DIAGNOSIS — G458 Other transient cerebral ischemic attacks and related syndromes: Secondary | ICD-10-CM | POA: Diagnosis not present

## 2016-03-19 DIAGNOSIS — H6123 Impacted cerumen, bilateral: Secondary | ICD-10-CM | POA: Diagnosis not present

## 2016-03-19 DIAGNOSIS — Z1389 Encounter for screening for other disorder: Secondary | ICD-10-CM | POA: Diagnosis not present

## 2016-03-19 DIAGNOSIS — Z0000 Encounter for general adult medical examination without abnormal findings: Secondary | ICD-10-CM | POA: Diagnosis not present

## 2016-03-19 DIAGNOSIS — D6489 Other specified anemias: Secondary | ICD-10-CM | POA: Diagnosis not present

## 2016-03-19 DIAGNOSIS — Z6832 Body mass index (BMI) 32.0-32.9, adult: Secondary | ICD-10-CM | POA: Diagnosis not present

## 2016-03-20 ENCOUNTER — Ambulatory Visit (HOSPITAL_COMMUNITY)
Admission: RE | Admit: 2016-03-20 | Discharge: 2016-03-20 | Disposition: A | Discharge status: Home / Self Care | Payer: Medicare Other | Source: Ambulatory Visit | Attending: Internal Medicine | Admitting: Internal Medicine

## 2016-03-20 ENCOUNTER — Encounter (HOSPITAL_COMMUNITY): Payer: Self-pay

## 2016-03-20 DIAGNOSIS — M810 Age-related osteoporosis without current pathological fracture: Secondary | ICD-10-CM | POA: Insufficient documentation

## 2016-03-20 MED ORDER — DENOSUMAB 60 MG/ML ~~LOC~~ SOLN
60.0000 mg | Freq: Once | SUBCUTANEOUS | Status: AC
  Administered 2016-03-20: 60 mg via SUBCUTANEOUS
  Filled 2016-03-20: qty 1

## 2016-03-20 NOTE — Discharge Instructions (Signed)
Denosumab injection °What is this medicine? °DENOSUMAB (den oh sue mab) slows bone breakdown. Prolia is used to treat osteoporosis in women after menopause and in men. Xgeva is used to treat a high calcium level due to cancer and to prevent bone fractures and other bone problems caused by multiple myeloma or cancer bone metastases. Xgeva is also used to treat giant cell tumor of the bone. °This medicine may be used for other purposes; ask your health care provider or pharmacist if you have questions. °COMMON BRAND NAME(S): Prolia, XGEVA °What should I tell my health care provider before I take this medicine? °They need to know if you have any of these conditions: °-dental disease °-having surgery or tooth extraction °-infection °-kidney disease °-low levels of calcium or Vitamin D in the blood °-malnutrition °-on hemodialysis °-skin conditions or sensitivity °-thyroid or parathyroid disease °-an unusual reaction to denosumab, other medicines, foods, dyes, or preservatives °-pregnant or trying to get pregnant °-breast-feeding °How should I use this medicine? °This medicine is for injection under the skin. It is given by a health care professional in a hospital or clinic setting. °If you are getting Prolia, a special MedGuide will be given to you by the pharmacist with each prescription and refill. Be sure to read this information carefully each time. °For Prolia, talk to your pediatrician regarding the use of this medicine in children. Special care may be needed. For Xgeva, talk to your pediatrician regarding the use of this medicine in children. While this drug may be prescribed for children as young as 13 years for selected conditions, precautions do apply. °Overdosage: If you think you have taken too much of this medicine contact a poison control center or emergency room at once. °NOTE: This medicine is only for you. Do not share this medicine with others. °What if I miss a dose? °It is important not to miss your  dose. Call your doctor or health care professional if you are unable to keep an appointment. °What may interact with this medicine? °Do not take this medicine with any of the following medications: °-other medicines containing denosumab °This medicine may also interact with the following medications: °-medicines that lower your chance of fighting infection °-steroid medicines like prednisone or cortisone °This list may not describe all possible interactions. Give your health care provider a list of all the medicines, herbs, non-prescription drugs, or dietary supplements you use. Also tell them if you smoke, drink alcohol, or use illegal drugs. Some items may interact with your medicine. °What should I watch for while using this medicine? °Visit your doctor or health care professional for regular checks on your progress. Your doctor or health care professional may order blood tests and other tests to see how you are doing. °Call your doctor or health care professional for advice if you get a fever, chills or sore throat, or other symptoms of a cold or flu. Do not treat yourself. This drug may decrease your body's ability to fight infection. Try to avoid being around people who are sick. °You should make sure you get enough calcium and vitamin D while you are taking this medicine, unless your doctor tells you not to. Discuss the foods you eat and the vitamins you take with your health care professional. °See your dentist regularly. Brush and floss your teeth as directed. Before you have any dental work done, tell your dentist you are receiving this medicine. °Do not become pregnant while taking this medicine or for 5 months after stopping   it. Talk with your doctor or health care professional about your birth control options while taking this medicine. Women should inform their doctor if they wish to become pregnant or think they might be pregnant. There is a potential for serious side effects to an unborn child. Talk  to your health care professional or pharmacist for more information. What side effects may I notice from receiving this medicine? Side effects that you should report to your doctor or health care professional as soon as possible: -allergic reactions like skin rash, itching or hives, swelling of the face, lips, or tongue -bone pain -breathing problems -dizziness -jaw pain, especially after dental work -redness, blistering, peeling of the skin -signs and symptoms of infection like fever or chills; cough; sore throat; pain or trouble passing urine -signs of low calcium like fast heartbeat, muscle cramps or muscle pain; pain, tingling, numbness in the hands or feet; seizures -unusual bleeding or bruising -unusually weak or tired Side effects that usually do not require medical attention (report to your doctor or health care professional if they continue or are bothersome): -constipation -diarrhea -headache -joint pain -loss of appetite -muscle pain -runny nose -tiredness -upset stomach This list may not describe all possible side effects. Call your doctor for medical advice about side effects. You may report side effects to FDA at 1-800-FDA-1088. Where should I keep my medicine? This medicine is only given in a clinic, doctor's office, or other health care setting and will not be stored at home. NOTE: This sheet is a summary. It may not cover all possible information. If you have questions about this medicine, talk to your doctor, pharmacist, or health care provider.  2018 Elsevier/Gold Standard (2016-01-16 19:17:21)

## 2016-05-15 DIAGNOSIS — M2550 Pain in unspecified joint: Secondary | ICD-10-CM | POA: Diagnosis not present

## 2016-05-15 DIAGNOSIS — Z79899 Other long term (current) drug therapy: Secondary | ICD-10-CM | POA: Diagnosis not present

## 2016-05-15 DIAGNOSIS — E669 Obesity, unspecified: Secondary | ICD-10-CM | POA: Diagnosis not present

## 2016-05-15 DIAGNOSIS — M810 Age-related osteoporosis without current pathological fracture: Secondary | ICD-10-CM | POA: Diagnosis not present

## 2016-05-15 DIAGNOSIS — Z6831 Body mass index (BMI) 31.0-31.9, adult: Secondary | ICD-10-CM | POA: Diagnosis not present

## 2016-05-15 DIAGNOSIS — M0579 Rheumatoid arthritis with rheumatoid factor of multiple sites without organ or systems involvement: Secondary | ICD-10-CM | POA: Diagnosis not present

## 2016-06-11 ENCOUNTER — Ambulatory Visit (INDEPENDENT_AMBULATORY_CARE_PROVIDER_SITE_OTHER): Payer: Medicare Other | Admitting: Podiatry

## 2016-06-11 ENCOUNTER — Encounter: Payer: Self-pay | Admitting: Podiatry

## 2016-06-11 VITALS — BP 153/89 | HR 75

## 2016-06-11 DIAGNOSIS — L98491 Non-pressure chronic ulcer of skin of other sites limited to breakdown of skin: Secondary | ICD-10-CM

## 2016-06-11 DIAGNOSIS — L84 Corns and callosities: Secondary | ICD-10-CM | POA: Diagnosis not present

## 2016-06-11 NOTE — Progress Notes (Signed)
   Subjective:    Patient ID: Jocelyn Bauer, female    DOB: 04/14/1928, 83 y.o.   MRN: [id number]  HPI As patient presents today complaining of generalized achiness and soreness rather diffusely in the MPJ area of the right and left feet. The symptoms have been present for a proximally 6+ years. Patient also complains of discomfort in the plantar calluses on the right and left feet. Patient's daughter admits to trimming plantar calluses occasionally. Patient wears thin sole shoes. She denies any other specific treatment She also complains of difficulty trimming her toenails.  Patient has a 40+ history of rheumatoid arthritis and osteoarthritis The patient's daughter is present in the treatment room  Review of Systems  All other systems reviewed and are negative.      Objective:   Physical Exam  Orientated 3  Vascular: DP pulses 2/4 bilaterally PT pulses 1/4 bilaterally Capillary reflex within normal limits bilaterally  Neurological: Sensation to 10 g monofilament wire intact 5/5 bilaterally Vibratory sensation reactive bilaterally Ankle reflexes reactive bilaterally  Dermatological: Atrophic skin with absent hair growth bilaterally Plantar callus first and fourth MPJ bilaterally Hemorrhagic ceased distal second left toe that breaks down into a very superficial ulcer measuring 2 mm after debridement. There is no active drainage, warmth or erythema Elongated, brittle toenails 6-10  Musculoskeletal: Atrophic fat-pad heel and MPJ bilaterally HAV left Hammertoe third-4 left and underlapped fifth left toe Limited  subtalar joint motion right within normal limits left Rigid hammertoe second left Manual motor testing dorsi flexion, plantar flexion 5/5        Assessment & Plan:   Assessment: Rheumatoid and osteoarthritis feet Superficial noninfected skin ulcer distal second left toe Plantar calluses right and left Atrophic fat-pads, bilaterally  Plan: Debride plantar  calluses/resulting in unroofing a superficial ulcer distal second left toe Debride second left keratoses breaking down into superficial ulcer treated with topical antibiotic ointment and a light gauze dressing. Patient and patient's daughter instructed apply triple antibiotic ointment and light gauze dressing to the second left toe daily. Instructed to observe for any sudden increase in pain, swelling, redness in the second left toe. If noticed present to emergency department  Recommended athletic running style shoes  Reevaluate 2 weeks

## 2016-06-11 NOTE — Patient Instructions (Signed)
Purchase a pair of running shoes Apply triple antibiotic ointment and a light gauze dressing to the end of the second left toe daily until healed If you notice any sudden increase in pain, swelling, redness in the second left toe contact our office immediately or present to urgent care

## 2016-07-02 ENCOUNTER — Ambulatory Visit: Payer: Self-pay | Admitting: Podiatry

## 2016-07-16 DIAGNOSIS — M2550 Pain in unspecified joint: Secondary | ICD-10-CM | POA: Diagnosis not present

## 2016-07-16 DIAGNOSIS — M05742 Rheumatoid arthritis with rheumatoid factor of left hand without organ or systems involvement: Secondary | ICD-10-CM | POA: Diagnosis not present

## 2016-07-16 DIAGNOSIS — Z79899 Other long term (current) drug therapy: Secondary | ICD-10-CM | POA: Diagnosis not present

## 2016-07-16 DIAGNOSIS — M05731 Rheumatoid arthritis with rheumatoid factor of right wrist without organ or systems involvement: Secondary | ICD-10-CM | POA: Diagnosis not present

## 2016-07-16 DIAGNOSIS — M05732 Rheumatoid arthritis with rheumatoid factor of left wrist without organ or systems involvement: Secondary | ICD-10-CM | POA: Diagnosis not present

## 2016-07-16 DIAGNOSIS — E669 Obesity, unspecified: Secondary | ICD-10-CM | POA: Diagnosis not present

## 2016-07-16 DIAGNOSIS — M0579 Rheumatoid arthritis with rheumatoid factor of multiple sites without organ or systems involvement: Secondary | ICD-10-CM | POA: Diagnosis not present

## 2016-07-16 DIAGNOSIS — M810 Age-related osteoporosis without current pathological fracture: Secondary | ICD-10-CM | POA: Diagnosis not present

## 2016-07-16 DIAGNOSIS — Z6830 Body mass index (BMI) 30.0-30.9, adult: Secondary | ICD-10-CM | POA: Diagnosis not present

## 2016-09-17 DIAGNOSIS — Z6832 Body mass index (BMI) 32.0-32.9, adult: Secondary | ICD-10-CM | POA: Diagnosis not present

## 2016-09-17 DIAGNOSIS — M545 Low back pain: Secondary | ICD-10-CM | POA: Diagnosis not present

## 2016-09-17 DIAGNOSIS — M0689 Other specified rheumatoid arthritis, multiple sites: Secondary | ICD-10-CM | POA: Diagnosis not present

## 2016-09-17 DIAGNOSIS — E784 Other hyperlipidemia: Secondary | ICD-10-CM | POA: Diagnosis not present

## 2016-09-17 DIAGNOSIS — E038 Other specified hypothyroidism: Secondary | ICD-10-CM | POA: Diagnosis not present

## 2016-09-17 DIAGNOSIS — M810 Age-related osteoporosis without current pathological fracture: Secondary | ICD-10-CM | POA: Diagnosis not present

## 2016-09-17 DIAGNOSIS — I10 Essential (primary) hypertension: Secondary | ICD-10-CM | POA: Diagnosis not present

## 2016-09-23 ENCOUNTER — Ambulatory Visit (HOSPITAL_COMMUNITY)
Admission: RE | Admit: 2016-09-23 | Discharge: 2016-09-23 | Disposition: A | Discharge status: Home / Self Care | Payer: Medicare Other | Source: Ambulatory Visit | Attending: Internal Medicine | Admitting: Internal Medicine

## 2016-10-01 ENCOUNTER — Encounter (HOSPITAL_COMMUNITY): Payer: Self-pay

## 2016-10-01 ENCOUNTER — Ambulatory Visit (HOSPITAL_COMMUNITY)
Admission: RE | Admit: 2016-10-01 | Discharge: 2016-10-01 | Disposition: A | Discharge status: Home / Self Care | Payer: Medicare Other | Source: Ambulatory Visit | Attending: Internal Medicine | Admitting: Internal Medicine

## 2016-10-01 DIAGNOSIS — M810 Age-related osteoporosis without current pathological fracture: Secondary | ICD-10-CM | POA: Insufficient documentation

## 2016-10-01 MED ORDER — DENOSUMAB 60 MG/ML ~~LOC~~ SOLN
60.0000 mg | Freq: Once | SUBCUTANEOUS | Status: AC
  Administered 2016-10-01: 60 mg via SUBCUTANEOUS
  Filled 2016-10-01: qty 1

## 2016-10-01 NOTE — Discharge Instructions (Signed)
Prolia Denosumab injection What is this medicine? DENOSUMAB (den oh sue mab) slows bone breakdown. Prolia is used to treat osteoporosis in women after menopause and in men. Delton See is used to treat a high calcium level due to cancer and to prevent bone fractures and other bone problems caused by multiple myeloma or cancer bone metastases. Delton See is also used to treat giant cell tumor of the bone. This medicine may be used for other purposes; ask your health care provider or pharmacist if you have questions. COMMON BRAND NAME(S): Prolia, XGEVA What should I tell my health care provider before I take this medicine? They need to know if you have any of these conditions: -dental disease -having surgery or tooth extraction -infection -kidney disease -low levels of calcium or Vitamin D in the blood -malnutrition -on hemodialysis -skin conditions or sensitivity -thyroid or parathyroid disease -an unusual reaction to denosumab, other medicines, foods, dyes, or preservatives -pregnant or trying to get pregnant -breast-feeding How should I use this medicine? This medicine is for injection under the skin. It is given by a health care professional in a hospital or clinic setting. If you are getting Prolia, a special MedGuide will be given to you by the pharmacist with each prescription and refill. Be sure to read this information carefully each time. For Prolia, talk to your pediatrician regarding the use of this medicine in children. Special care may be needed. For Delton See, talk to your pediatrician regarding the use of this medicine in children. While this drug may be prescribed for children as young as 13 years for selected conditions, precautions do apply. Overdosage: If you think you have taken too much of this medicine contact a poison control center or emergency room at once. NOTE: This medicine is only for you. Do not share this medicine with others. What if I miss a dose? It is important not  to miss your dose. Call your doctor or health care professional if you are unable to keep an appointment. What may interact with this medicine? Do not take this medicine with any of the following medications: -other medicines containing denosumab This medicine may also interact with the following medications: -medicines that lower your chance of fighting infection -steroid medicines like prednisone or cortisone This list may not describe all possible interactions. Give your health care provider a list of all the medicines, herbs, non-prescription drugs, or dietary supplements you use. Also tell them if you smoke, drink alcohol, or use illegal drugs. Some items may interact with your medicine. What should I watch for while using this medicine? Visit your doctor or health care professional for regular checks on your progress. Your doctor or health care professional may order blood tests and other tests to see how you are doing. Call your doctor or health care professional for advice if you get a fever, chills or sore throat, or other symptoms of a cold or flu. Do not treat yourself. This drug may decrease your body's ability to fight infection. Try to avoid being around people who are sick. You should make sure you get enough calcium and vitamin D while you are taking this medicine, unless your doctor tells you not to. Discuss the foods you eat and the vitamins you take with your health care professional. See your dentist regularly. Brush and floss your teeth as directed. Before you have any dental work done, tell your dentist you are receiving this medicine. Do not become pregnant while taking this medicine or for 5 months  after stopping it. Talk with your doctor or health care professional about your birth control options while taking this medicine. Women should inform their doctor if they wish to become pregnant or think they might be pregnant. There is a potential for serious side effects to an unborn  child. Talk to your health care professional or pharmacist for more information. What side effects may I notice from receiving this medicine? Side effects that you should report to your doctor or health care professional as soon as possible: -allergic reactions like skin rash, itching or hives, swelling of the face, lips, or tongue -bone pain -breathing problems -dizziness -jaw pain, especially after dental work -redness, blistering, peeling of the skin -signs and symptoms of infection like fever or chills; cough; sore throat; pain or trouble passing urine -signs of low calcium like fast heartbeat, muscle cramps or muscle pain; pain, tingling, numbness in the hands or feet; seizures -unusual bleeding or bruising -unusually weak or tired Side effects that usually do not require medical attention (report to your doctor or health care professional if they continue or are bothersome): -constipation -diarrhea -headache -joint pain -loss of appetite -muscle pain -runny nose -tiredness -upset stomach This list may not describe all possible side effects. Call your doctor for medical advice about side effects. You may report side effects to FDA at 1-800-FDA-1088. Where should I keep my medicine? This medicine is only given in a clinic, doctor's office, or other health care setting and will not be stored at home. NOTE: This sheet is a summary. It may not cover all possible information. If you have questions about this medicine, talk to your doctor, pharmacist, or health care provider.  2018 [employer] (2016-01-16 19:17:21)

## 2016-10-16 DIAGNOSIS — E669 Obesity, unspecified: Secondary | ICD-10-CM | POA: Diagnosis not present

## 2016-10-16 DIAGNOSIS — Z79899 Other long term (current) drug therapy: Secondary | ICD-10-CM | POA: Diagnosis not present

## 2016-10-16 DIAGNOSIS — M2550 Pain in unspecified joint: Secondary | ICD-10-CM | POA: Diagnosis not present

## 2016-10-16 DIAGNOSIS — M0579 Rheumatoid arthritis with rheumatoid factor of multiple sites without organ or systems involvement: Secondary | ICD-10-CM | POA: Diagnosis not present

## 2016-10-16 DIAGNOSIS — M810 Age-related osteoporosis without current pathological fracture: Secondary | ICD-10-CM | POA: Diagnosis not present

## 2016-10-16 DIAGNOSIS — Z6832 Body mass index (BMI) 32.0-32.9, adult: Secondary | ICD-10-CM | POA: Diagnosis not present

## 2016-11-23 DIAGNOSIS — Z23 Encounter for immunization: Secondary | ICD-10-CM | POA: Diagnosis not present

## 2016-11-25 DIAGNOSIS — N907 Vulvar cyst: Secondary | ICD-10-CM | POA: Diagnosis not present

## 2017-01-15 DIAGNOSIS — Z6832 Body mass index (BMI) 32.0-32.9, adult: Secondary | ICD-10-CM | POA: Diagnosis not present

## 2017-01-15 DIAGNOSIS — M810 Age-related osteoporosis without current pathological fracture: Secondary | ICD-10-CM | POA: Diagnosis not present

## 2017-01-15 DIAGNOSIS — I10 Essential (primary) hypertension: Secondary | ICD-10-CM | POA: Diagnosis not present

## 2017-01-15 DIAGNOSIS — M069 Rheumatoid arthritis, unspecified: Secondary | ICD-10-CM | POA: Diagnosis not present

## 2017-01-15 DIAGNOSIS — M545 Low back pain: Secondary | ICD-10-CM | POA: Diagnosis not present

## 2017-01-15 DIAGNOSIS — M542 Cervicalgia: Secondary | ICD-10-CM | POA: Diagnosis not present

## 2017-01-15 DIAGNOSIS — E038 Other specified hypothyroidism: Secondary | ICD-10-CM | POA: Diagnosis not present

## 2017-01-17 DIAGNOSIS — M810 Age-related osteoporosis without current pathological fracture: Secondary | ICD-10-CM | POA: Diagnosis not present

## 2017-01-17 DIAGNOSIS — R5382 Chronic fatigue, unspecified: Secondary | ICD-10-CM | POA: Diagnosis not present

## 2017-01-17 DIAGNOSIS — M0579 Rheumatoid arthritis with rheumatoid factor of multiple sites without organ or systems involvement: Secondary | ICD-10-CM | POA: Diagnosis not present

## 2017-01-17 DIAGNOSIS — Z6832 Body mass index (BMI) 32.0-32.9, adult: Secondary | ICD-10-CM | POA: Diagnosis not present

## 2017-01-17 DIAGNOSIS — Z79899 Other long term (current) drug therapy: Secondary | ICD-10-CM | POA: Diagnosis not present

## 2017-01-17 DIAGNOSIS — M2550 Pain in unspecified joint: Secondary | ICD-10-CM | POA: Diagnosis not present

## 2017-01-17 DIAGNOSIS — E669 Obesity, unspecified: Secondary | ICD-10-CM | POA: Diagnosis not present

## 2017-04-10 ENCOUNTER — Ambulatory Visit (HOSPITAL_COMMUNITY): Payer: Medicare Other

## 2017-04-17 DIAGNOSIS — G8929 Other chronic pain: Secondary | ICD-10-CM | POA: Diagnosis not present

## 2017-04-17 DIAGNOSIS — M0579 Rheumatoid arthritis with rheumatoid factor of multiple sites without organ or systems involvement: Secondary | ICD-10-CM | POA: Diagnosis not present

## 2017-04-17 DIAGNOSIS — E669 Obesity, unspecified: Secondary | ICD-10-CM | POA: Diagnosis not present

## 2017-04-17 DIAGNOSIS — M810 Age-related osteoporosis without current pathological fracture: Secondary | ICD-10-CM | POA: Diagnosis not present

## 2017-04-17 DIAGNOSIS — Z6833 Body mass index (BMI) 33.0-33.9, adult: Secondary | ICD-10-CM | POA: Diagnosis not present

## 2017-04-17 DIAGNOSIS — Z79899 Other long term (current) drug therapy: Secondary | ICD-10-CM | POA: Diagnosis not present

## 2017-04-17 DIAGNOSIS — M25562 Pain in left knee: Secondary | ICD-10-CM | POA: Diagnosis not present

## 2017-04-17 DIAGNOSIS — M2550 Pain in unspecified joint: Secondary | ICD-10-CM | POA: Diagnosis not present

## 2017-04-17 DIAGNOSIS — R5382 Chronic fatigue, unspecified: Secondary | ICD-10-CM | POA: Diagnosis not present

## 2017-04-17 DIAGNOSIS — M170 Bilateral primary osteoarthritis of knee: Secondary | ICD-10-CM | POA: Diagnosis not present

## 2017-04-17 DIAGNOSIS — M25561 Pain in right knee: Secondary | ICD-10-CM | POA: Diagnosis not present

## 2017-05-01 ENCOUNTER — Ambulatory Visit (HOSPITAL_COMMUNITY)
Admission: RE | Admit: 2017-05-01 | Discharge: 2017-05-01 | Disposition: A | Discharge status: Home / Self Care | Payer: Medicare Other | Source: Ambulatory Visit | Attending: Internal Medicine | Admitting: Internal Medicine

## 2017-05-01 ENCOUNTER — Encounter (HOSPITAL_COMMUNITY): Payer: Self-pay

## 2017-05-01 DIAGNOSIS — M810 Age-related osteoporosis without current pathological fracture: Secondary | ICD-10-CM | POA: Insufficient documentation

## 2017-05-01 MED ORDER — DENOSUMAB 60 MG/ML ~~LOC~~ SOSY
60.0000 mg | PREFILLED_SYRINGE | Freq: Once | SUBCUTANEOUS | Status: AC
  Administered 2017-05-01: 60 mg via SUBCUTANEOUS
  Filled 2017-05-01: qty 1

## 2017-05-01 NOTE — Discharge Instructions (Signed)
Denosumab injection / Prolia What is this medicine? DENOSUMAB (den oh sue mab) slows bone breakdown. Prolia is used to treat osteoporosis in women after menopause and in men. Delton See is used to treat a high calcium level due to cancer and to prevent bone fractures and other bone problems caused by multiple myeloma or cancer bone metastases. Delton See is also used to treat giant cell tumor of the bone. This medicine may be used for other purposes; ask your health care provider or pharmacist if you have questions. COMMON BRAND NAME(S): Prolia, XGEVA What should I tell my health care provider before I take this medicine? They need to know if you have any of these conditions: -dental disease -having surgery or tooth extraction -infection -kidney disease -low levels of calcium or Vitamin D in the blood -malnutrition -on hemodialysis -skin conditions or sensitivity -thyroid or parathyroid disease -an unusual reaction to denosumab, other medicines, foods, dyes, or preservatives -pregnant or trying to get pregnant -breast-feeding How should I use this medicine? This medicine is for injection under the skin. It is given by a health care professional in a hospital or clinic setting. If you are getting Prolia, a special MedGuide will be given to you by the pharmacist with each prescription and refill. Be sure to read this information carefully each time. For Prolia, talk to your pediatrician regarding the use of this medicine in children. Special care may be needed. For Delton See, talk to your pediatrician regarding the use of this medicine in children. While this drug may be prescribed for children as young as 13 years for selected conditions, precautions do apply. Overdosage: If you think you have taken too much of this medicine contact a poison control center or emergency room at once. NOTE: This medicine is only for you. Do not share this medicine with others. What if I miss a dose? It is important not to  miss your dose. Call your doctor or health care professional if you are unable to keep an appointment. What may interact with this medicine? Do not take this medicine with any of the following medications: -other medicines containing denosumab This medicine may also interact with the following medications: -medicines that lower your chance of fighting infection -steroid medicines like prednisone or cortisone This list may not describe all possible interactions. Give your health care provider a list of all the medicines, herbs, non-prescription drugs, or dietary supplements you use. Also tell them if you smoke, drink alcohol, or use illegal drugs. Some items may interact with your medicine. What should I watch for while using this medicine? Visit your doctor or health care professional for regular checks on your progress. Your doctor or health care professional may order blood tests and other tests to see how you are doing. Call your doctor or health care professional for advice if you get a fever, chills or sore throat, or other symptoms of a cold or flu. Do not treat yourself. This drug may decrease your body's ability to fight infection. Try to avoid being around people who are sick. You should make sure you get enough calcium and vitamin D while you are taking this medicine, unless your doctor tells you not to. Discuss the foods you eat and the vitamins you take with your health care professional. See your dentist regularly. Brush and floss your teeth as directed. Before you have any dental work done, tell your dentist you are receiving this medicine. Do not become pregnant while taking this medicine or for 5 months  after stopping it. Talk with your doctor or health care professional about your birth control options while taking this medicine. Women should inform their doctor if they wish to become pregnant or think they might be pregnant. There is a potential for serious side effects to an unborn  child. Talk to your health care professional or pharmacist for more information. What side effects may I notice from receiving this medicine? Side effects that you should report to your doctor or health care professional as soon as possible: -allergic reactions like skin rash, itching or hives, swelling of the face, lips, or tongue -bone pain -breathing problems -dizziness -jaw pain, especially after dental work -redness, blistering, peeling of the skin -signs and symptoms of infection like fever or chills; cough; sore throat; pain or trouble passing urine -signs of low calcium like fast heartbeat, muscle cramps or muscle pain; pain, tingling, numbness in the hands or feet; seizures -unusual bleeding or bruising -unusually weak or tired Side effects that usually do not require medical attention (report to your doctor or health care professional if they continue or are bothersome): -constipation -diarrhea -headache -joint pain -loss of appetite -muscle pain -runny nose -tiredness -upset stomach This list may not describe all possible side effects. Call your doctor for medical advice about side effects. You may report side effects to FDA at 1-800-FDA-1088. Where should I keep my medicine? This medicine is only given in a clinic, doctor's office, or other health care setting and will not be stored at home. NOTE: This sheet is a summary. It may not cover all possible information. If you have questions about this medicine, talk to your doctor, pharmacist, or health care provider.  2018 [employer] Standard (2016-01-16 19:17:21)

## 2017-05-08 DIAGNOSIS — E559 Vitamin D deficiency, unspecified: Secondary | ICD-10-CM | POA: Diagnosis not present

## 2017-05-08 DIAGNOSIS — E038 Other specified hypothyroidism: Secondary | ICD-10-CM | POA: Diagnosis not present

## 2017-05-08 DIAGNOSIS — I10 Essential (primary) hypertension: Secondary | ICD-10-CM | POA: Diagnosis not present

## 2017-05-08 DIAGNOSIS — E7849 Other hyperlipidemia: Secondary | ICD-10-CM | POA: Diagnosis not present

## 2017-05-15 DIAGNOSIS — Z6833 Body mass index (BMI) 33.0-33.9, adult: Secondary | ICD-10-CM | POA: Diagnosis not present

## 2017-05-15 DIAGNOSIS — Z9981 Dependence on supplemental oxygen: Secondary | ICD-10-CM | POA: Diagnosis not present

## 2017-05-15 DIAGNOSIS — M810 Age-related osteoporosis without current pathological fracture: Secondary | ICD-10-CM | POA: Diagnosis not present

## 2017-05-15 DIAGNOSIS — H6121 Impacted cerumen, right ear: Secondary | ICD-10-CM | POA: Diagnosis not present

## 2017-05-15 DIAGNOSIS — G458 Other transient cerebral ischemic attacks and related syndromes: Secondary | ICD-10-CM | POA: Diagnosis not present

## 2017-05-15 DIAGNOSIS — Z0000 Encounter for general adult medical examination without abnormal findings: Secondary | ICD-10-CM | POA: Diagnosis not present

## 2017-05-15 DIAGNOSIS — M542 Cervicalgia: Secondary | ICD-10-CM | POA: Diagnosis not present

## 2017-05-15 DIAGNOSIS — R918 Other nonspecific abnormal finding of lung field: Secondary | ICD-10-CM | POA: Diagnosis not present

## 2017-05-15 DIAGNOSIS — R600 Localized edema: Secondary | ICD-10-CM | POA: Diagnosis not present

## 2017-05-15 DIAGNOSIS — R0609 Other forms of dyspnea: Secondary | ICD-10-CM | POA: Diagnosis not present

## 2017-05-15 DIAGNOSIS — R82998 Other abnormal findings in urine: Secondary | ICD-10-CM | POA: Diagnosis not present

## 2017-05-15 DIAGNOSIS — Z1389 Encounter for screening for other disorder: Secondary | ICD-10-CM | POA: Diagnosis not present

## 2017-05-15 DIAGNOSIS — M069 Rheumatoid arthritis, unspecified: Secondary | ICD-10-CM | POA: Diagnosis not present

## 2017-05-15 DIAGNOSIS — I10 Essential (primary) hypertension: Secondary | ICD-10-CM | POA: Diagnosis not present

## 2017-07-04 ENCOUNTER — Ambulatory Visit (INDEPENDENT_AMBULATORY_CARE_PROVIDER_SITE_OTHER): Payer: Medicare Other | Admitting: Podiatry

## 2017-07-04 ENCOUNTER — Encounter: Payer: Self-pay | Admitting: Podiatry

## 2017-07-04 DIAGNOSIS — L909 Atrophic disorder of skin, unspecified: Secondary | ICD-10-CM

## 2017-07-04 DIAGNOSIS — R29898 Other symptoms and signs involving the musculoskeletal system: Secondary | ICD-10-CM | POA: Diagnosis not present

## 2017-07-04 NOTE — Progress Notes (Signed)
  Subjective:  Patient ID: [name], female    DOB: 1928/06/09,  MRN: [id number]  Chief Complaint  Patient presents with  . Plantar Warts    bilateral; pt stated, "feet hurt me so bad, want to talk about inserts"   84 y.o. female returns for the above complaint. Reports painful calluses to both feet. Would like to discuss inserts.  Objective:  There were no vitals filed for this visit. General AA&O x3. Normal mood and affect.  Vascular Pedal pulses palpable.  Neurologic Epicritic sensation grossly intact.  Dermatologic No open lesions. Skin normal texture and turgor. HPK submet 4 bilat.  Orthopedic: Fat pad atrophy bilat feet. POP about the HPKs.   Assessment & Plan:  Patient was evaluated and treated and all questions answered.  Hyperkeratoses bilat due to fat pad atrophy. -Lesions debrided courtesy. Educated on self care. -Dispense powerstep inserts. Patient cannot afford CMOs which would better offload callused areas.  Return if symptoms worsen or fail to improve.

## 2017-07-18 DIAGNOSIS — M25562 Pain in left knee: Secondary | ICD-10-CM | POA: Diagnosis not present

## 2017-07-18 DIAGNOSIS — M0579 Rheumatoid arthritis with rheumatoid factor of multiple sites without organ or systems involvement: Secondary | ICD-10-CM | POA: Diagnosis not present

## 2017-07-18 DIAGNOSIS — G8929 Other chronic pain: Secondary | ICD-10-CM | POA: Diagnosis not present

## 2017-07-18 DIAGNOSIS — M2550 Pain in unspecified joint: Secondary | ICD-10-CM | POA: Diagnosis not present

## 2017-07-18 DIAGNOSIS — Z6832 Body mass index (BMI) 32.0-32.9, adult: Secondary | ICD-10-CM | POA: Diagnosis not present

## 2017-07-18 DIAGNOSIS — E669 Obesity, unspecified: Secondary | ICD-10-CM | POA: Diagnosis not present

## 2017-07-18 DIAGNOSIS — M810 Age-related osteoporosis without current pathological fracture: Secondary | ICD-10-CM | POA: Diagnosis not present

## 2017-07-18 DIAGNOSIS — M25561 Pain in right knee: Secondary | ICD-10-CM | POA: Diagnosis not present

## 2017-07-18 DIAGNOSIS — M170 Bilateral primary osteoarthritis of knee: Secondary | ICD-10-CM | POA: Diagnosis not present

## 2017-07-18 DIAGNOSIS — Z79899 Other long term (current) drug therapy: Secondary | ICD-10-CM | POA: Diagnosis not present

## 2017-07-18 DIAGNOSIS — R5382 Chronic fatigue, unspecified: Secondary | ICD-10-CM | POA: Diagnosis not present

## 2017-10-20 DIAGNOSIS — M2550 Pain in unspecified joint: Secondary | ICD-10-CM | POA: Diagnosis not present

## 2017-10-20 DIAGNOSIS — Z6831 Body mass index (BMI) 31.0-31.9, adult: Secondary | ICD-10-CM | POA: Diagnosis not present

## 2017-10-20 DIAGNOSIS — E669 Obesity, unspecified: Secondary | ICD-10-CM | POA: Diagnosis not present

## 2017-10-20 DIAGNOSIS — Z79899 Other long term (current) drug therapy: Secondary | ICD-10-CM | POA: Diagnosis not present

## 2017-10-20 DIAGNOSIS — M0579 Rheumatoid arthritis with rheumatoid factor of multiple sites without organ or systems involvement: Secondary | ICD-10-CM | POA: Diagnosis not present

## 2017-10-20 DIAGNOSIS — M810 Age-related osteoporosis without current pathological fracture: Secondary | ICD-10-CM | POA: Diagnosis not present

## 2017-11-04 ENCOUNTER — Encounter (HOSPITAL_COMMUNITY): Payer: Self-pay

## 2017-11-04 ENCOUNTER — Ambulatory Visit (HOSPITAL_COMMUNITY)
Admission: RE | Admit: 2017-11-04 | Discharge: 2017-11-04 | Disposition: A | Discharge status: Home / Self Care | Payer: Medicare Other | Source: Ambulatory Visit | Attending: Internal Medicine | Admitting: Internal Medicine

## 2017-11-04 DIAGNOSIS — M810 Age-related osteoporosis without current pathological fracture: Secondary | ICD-10-CM | POA: Diagnosis not present

## 2017-11-04 MED ORDER — DENOSUMAB 60 MG/ML ~~LOC~~ SOSY
60.0000 mg | PREFILLED_SYRINGE | Freq: Once | SUBCUTANEOUS | Status: AC
  Administered 2017-11-04: 60 mg via SUBCUTANEOUS
  Filled 2017-11-04: qty 1

## 2017-11-04 NOTE — Progress Notes (Signed)
Prolia 60 mg sq given to pt in lt upper outer arm.  Information sheet on Prolia given to pt.  Next appointment for Prolia given to pt for April 2020.  Pt d/c via wheelchair with 3 family members to lobby.

## 2017-11-04 NOTE — Discharge Instructions (Signed)
Denosumab injection °What is this medicine? °DENOSUMAB (den oh sue mab) slows bone breakdown. Prolia is used to treat osteoporosis in women after menopause and in men. Xgeva is used to treat a high calcium level due to cancer and to prevent bone fractures and other bone problems caused by multiple myeloma or cancer bone metastases. Xgeva is also used to treat giant cell tumor of the bone. °This medicine may be used for other purposes; ask your health care provider or pharmacist if you have questions. °COMMON BRAND NAME(S): Prolia, XGEVA °What should I tell my health care provider before I take this medicine? °They need to know if you have any of these conditions: °-dental disease °-having surgery or tooth extraction °-infection °-kidney disease °-low levels of calcium or Vitamin D in the blood °-malnutrition °-on hemodialysis °-skin conditions or sensitivity °-thyroid or parathyroid disease °-an unusual reaction to denosumab, other medicines, foods, dyes, or preservatives °-pregnant or trying to get pregnant °-breast-feeding °How should I use this medicine? °This medicine is for injection under the skin. It is given by a health care professional in a hospital or clinic setting. °If you are getting Prolia, a special MedGuide will be given to you by the pharmacist with each prescription and refill. Be sure to read this information carefully each time. °For Prolia, talk to your pediatrician regarding the use of this medicine in children. Special care may be needed. For Xgeva, talk to your pediatrician regarding the use of this medicine in children. While this drug may be prescribed for children as young as 13 years for selected conditions, precautions do apply. °Overdosage: If you think you have taken too much of this medicine contact a poison control center or emergency room at once. °NOTE: This medicine is only for you. Do not share this medicine with others. °What if I miss a dose? °It is important not to miss your  dose. Call your doctor or health care professional if you are unable to keep an appointment. °What may interact with this medicine? °Do not take this medicine with any of the following medications: °-other medicines containing denosumab °This medicine may also interact with the following medications: °-medicines that lower your chance of fighting infection °-steroid medicines like prednisone or cortisone °This list may not describe all possible interactions. Give your health care provider a list of all the medicines, herbs, non-prescription drugs, or dietary supplements you use. Also tell them if you smoke, drink alcohol, or use illegal drugs. Some items may interact with your medicine. °What should I watch for while using this medicine? °Visit your doctor or health care professional for regular checks on your progress. Your doctor or health care professional may order blood tests and other tests to see how you are doing. °Call your doctor or health care professional for advice if you get a fever, chills or sore throat, or other symptoms of a cold or flu. Do not treat yourself. This drug may decrease your body's ability to fight infection. Try to avoid being around people who are sick. °You should make sure you get enough calcium and vitamin D while you are taking this medicine, unless your doctor tells you not to. Discuss the foods you eat and the vitamins you take with your health care professional. °See your dentist regularly. Brush and floss your teeth as directed. Before you have any dental work done, tell your dentist you are receiving this medicine. °Do not become pregnant while taking this medicine or for 5 months after stopping   it. Talk with your doctor or health care professional about your birth control options while taking this medicine. Women should inform their doctor if they wish to become pregnant or think they might be pregnant. There is a potential for serious side effects to an unborn child. Talk  to your health care professional or pharmacist for more information. What side effects may I notice from receiving this medicine? Side effects that you should report to your doctor or health care professional as soon as possible: -allergic reactions like skin rash, itching or hives, swelling of the face, lips, or tongue -bone pain -breathing problems -dizziness -jaw pain, especially after dental work -redness, blistering, peeling of the skin -signs and symptoms of infection like fever or chills; cough; sore throat; pain or trouble passing urine -signs of low calcium like fast heartbeat, muscle cramps or muscle pain; pain, tingling, numbness in the hands or feet; seizures -unusual bleeding or bruising -unusually weak or tired Side effects that usually do not require medical attention (report to your doctor or health care professional if they continue or are bothersome): -constipation -diarrhea -headache -joint pain -loss of appetite -muscle pain -runny nose -tiredness -upset stomach This list may not describe all possible side effects. Call your doctor for medical advice about side effects. You may report side effects to FDA at 1-800-FDA-1088. Where should I keep my medicine? This medicine is only given in a clinic, doctor's office, or other health care setting and will not be stored at home. NOTE: This sheet is a summary. It may not cover all possible information. If you have questions about this medicine, talk to your doctor, pharmacist, or health care provider.  2018 Elsevier/Gold Standard (2016-01-16 19:17:21)

## 2017-11-13 DIAGNOSIS — E038 Other specified hypothyroidism: Secondary | ICD-10-CM | POA: Diagnosis not present

## 2017-11-13 DIAGNOSIS — M545 Low back pain: Secondary | ICD-10-CM | POA: Diagnosis not present

## 2017-11-13 DIAGNOSIS — Z23 Encounter for immunization: Secondary | ICD-10-CM | POA: Diagnosis not present

## 2017-11-13 DIAGNOSIS — I10 Essential (primary) hypertension: Secondary | ICD-10-CM | POA: Diagnosis not present

## 2017-11-13 DIAGNOSIS — G458 Other transient cerebral ischemic attacks and related syndromes: Secondary | ICD-10-CM | POA: Diagnosis not present

## 2017-11-13 DIAGNOSIS — K219 Gastro-esophageal reflux disease without esophagitis: Secondary | ICD-10-CM | POA: Diagnosis not present

## 2017-11-13 DIAGNOSIS — M069 Rheumatoid arthritis, unspecified: Secondary | ICD-10-CM | POA: Diagnosis not present

## 2017-11-13 DIAGNOSIS — Z6831 Body mass index (BMI) 31.0-31.9, adult: Secondary | ICD-10-CM | POA: Diagnosis not present

## 2017-11-13 DIAGNOSIS — M810 Age-related osteoporosis without current pathological fracture: Secondary | ICD-10-CM | POA: Diagnosis not present

## 2018-01-20 DIAGNOSIS — R5382 Chronic fatigue, unspecified: Secondary | ICD-10-CM | POA: Diagnosis not present

## 2018-01-20 DIAGNOSIS — M810 Age-related osteoporosis without current pathological fracture: Secondary | ICD-10-CM | POA: Diagnosis not present

## 2018-01-20 DIAGNOSIS — Z6831 Body mass index (BMI) 31.0-31.9, adult: Secondary | ICD-10-CM | POA: Diagnosis not present

## 2018-01-20 DIAGNOSIS — M2550 Pain in unspecified joint: Secondary | ICD-10-CM | POA: Diagnosis not present

## 2018-01-20 DIAGNOSIS — Z79899 Other long term (current) drug therapy: Secondary | ICD-10-CM | POA: Diagnosis not present

## 2018-01-20 DIAGNOSIS — M0579 Rheumatoid arthritis with rheumatoid factor of multiple sites without organ or systems involvement: Secondary | ICD-10-CM | POA: Diagnosis not present

## 2018-01-20 DIAGNOSIS — E669 Obesity, unspecified: Secondary | ICD-10-CM | POA: Diagnosis not present

## 2018-03-04 ENCOUNTER — Encounter: Payer: Self-pay | Admitting: Podiatrist

## 2018-03-04 ENCOUNTER — Ambulatory Visit (INDEPENDENT_AMBULATORY_CARE_PROVIDER_SITE_OTHER): Payer: Self-pay | Admitting: Podiatrist

## 2018-03-04 DIAGNOSIS — M79676 Pain in unspecified toe(s): Secondary | ICD-10-CM

## 2018-03-04 DIAGNOSIS — L909 Atrophic disorder of skin, unspecified: Secondary | ICD-10-CM

## 2018-03-04 DIAGNOSIS — R29898 Other symptoms and signs involving the musculoskeletal system: Secondary | ICD-10-CM

## 2018-03-04 DIAGNOSIS — L851 Acquired keratosis [keratoderma] palmaris et plantaris: Secondary | ICD-10-CM | POA: Diagnosis not present

## 2018-03-04 DIAGNOSIS — M216X2 Other acquired deformities of left foot: Secondary | ICD-10-CM

## 2018-03-04 NOTE — Progress Notes (Signed)
   Chief Complaint  Patient presents with  . Foot Problem    I have some spots on both feet and they do hurt alot     HPI: Patient is 85 y.o. female who presents today for painful lesoins on the bottoms of both feet-  She has rheumatoid arthritis and states the pain is worse on the left foot.  She has had the lesions trimmed and states this does help her symptoms.  Dr. March Rummage also put her in a powerstep insert which was also helpful but has now worn out.  She is requesting another insert.    Allergies  Allergen Reactions  . Contrast Media [Iodinated Diagnostic Agents] Other (See Comments)    Itching.( 13 hr pre medication regimen worked fine. )  . Tdap [Tetanus-Diphth-Acell Pertussis] Hives  . Iodine     Review of systems is reviewed and negative.   Physical Exam  Patient is awake, alert, and oriented x 3.  In no acute distress.    Vascular status is intact with palpable pedal pulses DP and PT bilateral and capillary refill time less than 3 seconds bilateral.  No edema or erythema noted.  Neurological exam reveals epicritic and protective sensation grossly intact bilateral.  Dermatological exam reveals skin is supple and dry to bilateral feet. Discreet porokeratotic lesions are present sub 1 and 5 bilateral left worse than right.  Distal tip of left second toe also has a callus.  Musculoskeletal exam: contracture at the mpj and digits present left foot consistent with rheumatoid arthritis.  Plantar flexed metatarsals present.  Right foot is more rectus   Assessment: Prominent metatarsal with porokeratotic lesions x 5  Plan: debridment of lesions carried out today and patient tolerated well.  Dispensed powerstep inserts for her use.  She will return as needed.

## 2018-05-04 DIAGNOSIS — Z79899 Other long term (current) drug therapy: Secondary | ICD-10-CM | POA: Diagnosis not present

## 2018-05-04 DIAGNOSIS — M810 Age-related osteoporosis without current pathological fracture: Secondary | ICD-10-CM | POA: Diagnosis not present

## 2018-05-07 ENCOUNTER — Ambulatory Visit (HOSPITAL_COMMUNITY): Payer: Medicare Other

## 2018-05-07 ENCOUNTER — Encounter (HOSPITAL_COMMUNITY): Payer: Self-pay

## 2018-05-12 ENCOUNTER — Encounter (HOSPITAL_COMMUNITY): Payer: Self-pay

## 2018-05-12 ENCOUNTER — Ambulatory Visit (HOSPITAL_COMMUNITY)
Admission: RE | Admit: 2018-05-12 | Discharge: 2018-05-12 | Disposition: A | Discharge status: Home / Self Care | Payer: Medicare Other | Source: Ambulatory Visit | Attending: Internal Medicine | Admitting: Internal Medicine

## 2018-05-12 ENCOUNTER — Other Ambulatory Visit: Payer: Self-pay

## 2018-05-12 DIAGNOSIS — M810 Age-related osteoporosis without current pathological fracture: Secondary | ICD-10-CM | POA: Insufficient documentation

## 2018-05-12 MED ORDER — DENOSUMAB 60 MG/ML ~~LOC~~ SOSY
60.0000 mg | PREFILLED_SYRINGE | Freq: Once | SUBCUTANEOUS | Status: AC
  Administered 2018-05-12: 60 mg via SUBCUTANEOUS

## 2018-05-12 MED ORDER — DENOSUMAB 60 MG/ML ~~LOC~~ SOSY
PREFILLED_SYRINGE | SUBCUTANEOUS | Status: AC
  Filled 2018-05-12: qty 1

## 2018-05-12 NOTE — Progress Notes (Signed)
Pt states she fell Fri 05/08/2018 on her left hip , currently has a light colored bruise on upper left lateral thigh and is sore but can walk on it.  Contacted Dr Perini's office to confirm proceding with Prolia injection.  Ok to proceed.

## 2018-06-04 DIAGNOSIS — M810 Age-related osteoporosis without current pathological fracture: Secondary | ICD-10-CM | POA: Diagnosis not present

## 2018-06-04 DIAGNOSIS — E038 Other specified hypothyroidism: Secondary | ICD-10-CM | POA: Diagnosis not present

## 2018-06-04 DIAGNOSIS — E7849 Other hyperlipidemia: Secondary | ICD-10-CM | POA: Diagnosis not present

## 2018-06-09 DIAGNOSIS — R82998 Other abnormal findings in urine: Secondary | ICD-10-CM | POA: Diagnosis not present

## 2018-06-09 DIAGNOSIS — I10 Essential (primary) hypertension: Secondary | ICD-10-CM | POA: Diagnosis not present

## 2018-06-18 DIAGNOSIS — E669 Obesity, unspecified: Secondary | ICD-10-CM | POA: Diagnosis not present

## 2018-06-18 DIAGNOSIS — I10 Essential (primary) hypertension: Secondary | ICD-10-CM | POA: Diagnosis not present

## 2018-06-18 DIAGNOSIS — F410 Panic disorder [episodic paroxysmal anxiety] without agoraphobia: Secondary | ICD-10-CM | POA: Diagnosis not present

## 2018-06-18 DIAGNOSIS — D649 Anemia, unspecified: Secondary | ICD-10-CM | POA: Diagnosis not present

## 2018-06-18 DIAGNOSIS — M542 Cervicalgia: Secondary | ICD-10-CM | POA: Diagnosis not present

## 2018-06-18 DIAGNOSIS — N390 Urinary tract infection, site not specified: Secondary | ICD-10-CM | POA: Diagnosis not present

## 2018-06-18 DIAGNOSIS — R918 Other nonspecific abnormal finding of lung field: Secondary | ICD-10-CM | POA: Diagnosis not present

## 2018-06-18 DIAGNOSIS — G459 Transient cerebral ischemic attack, unspecified: Secondary | ICD-10-CM | POA: Diagnosis not present

## 2018-06-18 DIAGNOSIS — Z0000 Encounter for general adult medical examination without abnormal findings: Secondary | ICD-10-CM | POA: Diagnosis not present

## 2018-06-18 DIAGNOSIS — J309 Allergic rhinitis, unspecified: Secondary | ICD-10-CM | POA: Diagnosis not present

## 2018-06-18 DIAGNOSIS — Z1339 Encounter for screening examination for other mental health and behavioral disorders: Secondary | ICD-10-CM | POA: Diagnosis not present

## 2018-06-18 DIAGNOSIS — M069 Rheumatoid arthritis, unspecified: Secondary | ICD-10-CM | POA: Diagnosis not present

## 2018-06-18 DIAGNOSIS — Z1331 Encounter for screening for depression: Secondary | ICD-10-CM | POA: Diagnosis not present

## 2018-06-18 DIAGNOSIS — M810 Age-related osteoporosis without current pathological fracture: Secondary | ICD-10-CM | POA: Diagnosis not present

## 2018-07-13 DIAGNOSIS — M810 Age-related osteoporosis without current pathological fracture: Secondary | ICD-10-CM | POA: Diagnosis not present

## 2018-07-13 DIAGNOSIS — M25562 Pain in left knee: Secondary | ICD-10-CM | POA: Diagnosis not present

## 2018-07-13 DIAGNOSIS — R5382 Chronic fatigue, unspecified: Secondary | ICD-10-CM | POA: Diagnosis not present

## 2018-07-13 DIAGNOSIS — Z6831 Body mass index (BMI) 31.0-31.9, adult: Secondary | ICD-10-CM | POA: Diagnosis not present

## 2018-07-13 DIAGNOSIS — M0579 Rheumatoid arthritis with rheumatoid factor of multiple sites without organ or systems involvement: Secondary | ICD-10-CM | POA: Diagnosis not present

## 2018-07-13 DIAGNOSIS — Z79899 Other long term (current) drug therapy: Secondary | ICD-10-CM | POA: Diagnosis not present

## 2018-07-13 DIAGNOSIS — M2550 Pain in unspecified joint: Secondary | ICD-10-CM | POA: Diagnosis not present

## 2018-07-13 DIAGNOSIS — E669 Obesity, unspecified: Secondary | ICD-10-CM | POA: Diagnosis not present

## 2018-09-23 ENCOUNTER — Other Ambulatory Visit: Payer: Self-pay | Admitting: Internal Medicine

## 2018-09-23 ENCOUNTER — Ambulatory Visit
Admission: RE | Admit: 2018-09-23 | Discharge: 2018-09-23 | Disposition: A | Discharge status: Home / Self Care | Payer: Medicare Other | Source: Ambulatory Visit | Attending: Internal Medicine | Admitting: Internal Medicine

## 2018-09-23 ENCOUNTER — Other Ambulatory Visit: Payer: BLUE CROSS/BLUE SHIELD

## 2018-09-23 DIAGNOSIS — R519 Headache, unspecified: Secondary | ICD-10-CM

## 2018-09-23 DIAGNOSIS — S0990XA Unspecified injury of head, initial encounter: Secondary | ICD-10-CM

## 2018-09-23 DIAGNOSIS — S199XXA Unspecified injury of neck, initial encounter: Secondary | ICD-10-CM | POA: Diagnosis not present

## 2018-09-23 DIAGNOSIS — Z23 Encounter for immunization: Secondary | ICD-10-CM | POA: Diagnosis not present

## 2018-09-23 DIAGNOSIS — R52 Pain, unspecified: Secondary | ICD-10-CM

## 2018-09-23 DIAGNOSIS — I119 Hypertensive heart disease without heart failure: Secondary | ICD-10-CM | POA: Diagnosis not present

## 2018-09-23 DIAGNOSIS — R51 Headache: Secondary | ICD-10-CM | POA: Diagnosis not present

## 2018-09-23 DIAGNOSIS — M069 Rheumatoid arthritis, unspecified: Secondary | ICD-10-CM | POA: Diagnosis not present

## 2018-09-23 DIAGNOSIS — M542 Cervicalgia: Secondary | ICD-10-CM | POA: Diagnosis not present

## 2018-09-23 DIAGNOSIS — S50819A Abrasion of unspecified forearm, initial encounter: Secondary | ICD-10-CM | POA: Diagnosis not present

## 2018-10-14 DIAGNOSIS — R5382 Chronic fatigue, unspecified: Secondary | ICD-10-CM | POA: Diagnosis not present

## 2018-10-14 DIAGNOSIS — E669 Obesity, unspecified: Secondary | ICD-10-CM | POA: Diagnosis not present

## 2018-10-14 DIAGNOSIS — M810 Age-related osteoporosis without current pathological fracture: Secondary | ICD-10-CM | POA: Diagnosis not present

## 2018-10-14 DIAGNOSIS — M25562 Pain in left knee: Secondary | ICD-10-CM | POA: Diagnosis not present

## 2018-10-14 DIAGNOSIS — Z79899 Other long term (current) drug therapy: Secondary | ICD-10-CM | POA: Diagnosis not present

## 2018-10-14 DIAGNOSIS — M0579 Rheumatoid arthritis with rheumatoid factor of multiple sites without organ or systems involvement: Secondary | ICD-10-CM | POA: Diagnosis not present

## 2018-10-14 DIAGNOSIS — Z6831 Body mass index (BMI) 31.0-31.9, adult: Secondary | ICD-10-CM | POA: Diagnosis not present

## 2018-10-14 DIAGNOSIS — M2550 Pain in unspecified joint: Secondary | ICD-10-CM | POA: Diagnosis not present

## 2018-10-19 DIAGNOSIS — M810 Age-related osteoporosis without current pathological fracture: Secondary | ICD-10-CM | POA: Diagnosis not present

## 2018-10-19 DIAGNOSIS — G459 Transient cerebral ischemic attack, unspecified: Secondary | ICD-10-CM | POA: Diagnosis not present

## 2018-10-19 DIAGNOSIS — I119 Hypertensive heart disease without heart failure: Secondary | ICD-10-CM | POA: Diagnosis not present

## 2018-10-19 DIAGNOSIS — R519 Headache, unspecified: Secondary | ICD-10-CM | POA: Diagnosis not present

## 2018-10-19 DIAGNOSIS — E039 Hypothyroidism, unspecified: Secondary | ICD-10-CM | POA: Diagnosis not present

## 2018-10-19 DIAGNOSIS — M069 Rheumatoid arthritis, unspecified: Secondary | ICD-10-CM | POA: Diagnosis not present

## 2018-10-19 DIAGNOSIS — R2689 Other abnormalities of gait and mobility: Secondary | ICD-10-CM | POA: Diagnosis not present

## 2018-10-19 DIAGNOSIS — I10 Essential (primary) hypertension: Secondary | ICD-10-CM | POA: Diagnosis not present

## 2018-10-19 DIAGNOSIS — R918 Other nonspecific abnormal finding of lung field: Secondary | ICD-10-CM | POA: Diagnosis not present

## 2018-10-19 DIAGNOSIS — D649 Anemia, unspecified: Secondary | ICD-10-CM | POA: Diagnosis not present

## 2018-10-19 DIAGNOSIS — M545 Low back pain: Secondary | ICD-10-CM | POA: Diagnosis not present

## 2018-10-25 DIAGNOSIS — M545 Low back pain: Secondary | ICD-10-CM | POA: Diagnosis not present

## 2018-10-25 DIAGNOSIS — G8929 Other chronic pain: Secondary | ICD-10-CM | POA: Diagnosis not present

## 2018-10-25 DIAGNOSIS — Z87891 Personal history of nicotine dependence: Secondary | ICD-10-CM | POA: Diagnosis not present

## 2018-10-25 DIAGNOSIS — M47812 Spondylosis without myelopathy or radiculopathy, cervical region: Secondary | ICD-10-CM | POA: Diagnosis not present

## 2018-10-25 DIAGNOSIS — D649 Anemia, unspecified: Secondary | ICD-10-CM | POA: Diagnosis not present

## 2018-10-25 DIAGNOSIS — M0600 Rheumatoid arthritis without rheumatoid factor, unspecified site: Secondary | ICD-10-CM | POA: Diagnosis not present

## 2018-10-25 DIAGNOSIS — Z96622 Presence of left artificial elbow joint: Secondary | ICD-10-CM | POA: Diagnosis not present

## 2018-10-25 DIAGNOSIS — Z96621 Presence of right artificial elbow joint: Secondary | ICD-10-CM | POA: Diagnosis not present

## 2018-10-25 DIAGNOSIS — Z96642 Presence of left artificial hip joint: Secondary | ICD-10-CM | POA: Diagnosis not present

## 2018-10-25 DIAGNOSIS — Z7722 Contact with and (suspected) exposure to environmental tobacco smoke (acute) (chronic): Secondary | ICD-10-CM | POA: Diagnosis not present

## 2018-10-25 DIAGNOSIS — E559 Vitamin D deficiency, unspecified: Secondary | ICD-10-CM | POA: Diagnosis not present

## 2018-10-25 DIAGNOSIS — Z9181 History of falling: Secondary | ICD-10-CM | POA: Diagnosis not present

## 2018-10-25 DIAGNOSIS — M810 Age-related osteoporosis without current pathological fracture: Secondary | ICD-10-CM | POA: Diagnosis not present

## 2018-10-25 DIAGNOSIS — I119 Hypertensive heart disease without heart failure: Secondary | ICD-10-CM | POA: Diagnosis not present

## 2018-10-25 DIAGNOSIS — R2689 Other abnormalities of gait and mobility: Secondary | ICD-10-CM | POA: Diagnosis not present

## 2018-10-25 DIAGNOSIS — M4807 Spinal stenosis, lumbosacral region: Secondary | ICD-10-CM | POA: Diagnosis not present

## 2018-10-25 DIAGNOSIS — Z8673 Personal history of transient ischemic attack (TIA), and cerebral infarction without residual deficits: Secondary | ICD-10-CM | POA: Diagnosis not present

## 2018-10-27 DIAGNOSIS — R2689 Other abnormalities of gait and mobility: Secondary | ICD-10-CM | POA: Diagnosis not present

## 2018-10-27 DIAGNOSIS — G8929 Other chronic pain: Secondary | ICD-10-CM | POA: Diagnosis not present

## 2018-10-27 DIAGNOSIS — I119 Hypertensive heart disease without heart failure: Secondary | ICD-10-CM | POA: Diagnosis not present

## 2018-10-27 DIAGNOSIS — M0600 Rheumatoid arthritis without rheumatoid factor, unspecified site: Secondary | ICD-10-CM | POA: Diagnosis not present

## 2018-10-27 DIAGNOSIS — M4807 Spinal stenosis, lumbosacral region: Secondary | ICD-10-CM | POA: Diagnosis not present

## 2018-10-27 DIAGNOSIS — M545 Low back pain: Secondary | ICD-10-CM | POA: Diagnosis not present

## 2018-10-28 ENCOUNTER — Other Ambulatory Visit (HOSPITAL_COMMUNITY): Payer: Self-pay | Admitting: *Deleted

## 2018-10-29 ENCOUNTER — Other Ambulatory Visit: Payer: Self-pay

## 2018-10-29 ENCOUNTER — Encounter (HOSPITAL_COMMUNITY)
Admission: RE | Admit: 2018-10-29 | Discharge: 2018-10-29 | Disposition: A | Discharge status: Home / Self Care | Payer: Medicare Other | Source: Ambulatory Visit | Attending: Internal Medicine | Admitting: Internal Medicine

## 2018-10-29 DIAGNOSIS — M810 Age-related osteoporosis without current pathological fracture: Secondary | ICD-10-CM | POA: Diagnosis not present

## 2018-10-29 MED ORDER — DENOSUMAB 60 MG/ML ~~LOC~~ SOSY
60.0000 mg | PREFILLED_SYRINGE | Freq: Once | SUBCUTANEOUS | Status: AC
  Administered 2018-10-29: 60 mg via SUBCUTANEOUS

## 2018-10-30 ENCOUNTER — Encounter (HOSPITAL_COMMUNITY): Payer: BLUE CROSS/BLUE SHIELD

## 2018-10-30 DIAGNOSIS — M4807 Spinal stenosis, lumbosacral region: Secondary | ICD-10-CM | POA: Diagnosis not present

## 2018-10-30 DIAGNOSIS — I119 Hypertensive heart disease without heart failure: Secondary | ICD-10-CM | POA: Diagnosis not present

## 2018-10-30 DIAGNOSIS — M0600 Rheumatoid arthritis without rheumatoid factor, unspecified site: Secondary | ICD-10-CM | POA: Diagnosis not present

## 2018-10-30 DIAGNOSIS — R2689 Other abnormalities of gait and mobility: Secondary | ICD-10-CM | POA: Diagnosis not present

## 2018-10-30 DIAGNOSIS — M545 Low back pain: Secondary | ICD-10-CM | POA: Diagnosis not present

## 2018-10-30 DIAGNOSIS — G8929 Other chronic pain: Secondary | ICD-10-CM | POA: Diagnosis not present

## 2018-11-05 DIAGNOSIS — R2689 Other abnormalities of gait and mobility: Secondary | ICD-10-CM | POA: Diagnosis not present

## 2018-11-05 DIAGNOSIS — M545 Low back pain: Secondary | ICD-10-CM | POA: Diagnosis not present

## 2018-11-05 DIAGNOSIS — M0600 Rheumatoid arthritis without rheumatoid factor, unspecified site: Secondary | ICD-10-CM | POA: Diagnosis not present

## 2018-11-05 DIAGNOSIS — I119 Hypertensive heart disease without heart failure: Secondary | ICD-10-CM | POA: Diagnosis not present

## 2018-11-05 DIAGNOSIS — G8929 Other chronic pain: Secondary | ICD-10-CM | POA: Diagnosis not present

## 2018-11-05 DIAGNOSIS — M4807 Spinal stenosis, lumbosacral region: Secondary | ICD-10-CM | POA: Diagnosis not present

## 2018-11-06 DIAGNOSIS — R2689 Other abnormalities of gait and mobility: Secondary | ICD-10-CM | POA: Diagnosis not present

## 2018-11-06 DIAGNOSIS — M4807 Spinal stenosis, lumbosacral region: Secondary | ICD-10-CM | POA: Diagnosis not present

## 2018-11-06 DIAGNOSIS — M545 Low back pain: Secondary | ICD-10-CM | POA: Diagnosis not present

## 2018-11-06 DIAGNOSIS — I119 Hypertensive heart disease without heart failure: Secondary | ICD-10-CM | POA: Diagnosis not present

## 2018-11-06 DIAGNOSIS — G8929 Other chronic pain: Secondary | ICD-10-CM | POA: Diagnosis not present

## 2018-11-06 DIAGNOSIS — M0600 Rheumatoid arthritis without rheumatoid factor, unspecified site: Secondary | ICD-10-CM | POA: Diagnosis not present

## 2018-11-10 DIAGNOSIS — G8929 Other chronic pain: Secondary | ICD-10-CM | POA: Diagnosis not present

## 2018-11-10 DIAGNOSIS — M4807 Spinal stenosis, lumbosacral region: Secondary | ICD-10-CM | POA: Diagnosis not present

## 2018-11-10 DIAGNOSIS — M545 Low back pain: Secondary | ICD-10-CM | POA: Diagnosis not present

## 2018-11-10 DIAGNOSIS — M0600 Rheumatoid arthritis without rheumatoid factor, unspecified site: Secondary | ICD-10-CM | POA: Diagnosis not present

## 2018-11-10 DIAGNOSIS — R2689 Other abnormalities of gait and mobility: Secondary | ICD-10-CM | POA: Diagnosis not present

## 2018-11-10 DIAGNOSIS — I119 Hypertensive heart disease without heart failure: Secondary | ICD-10-CM | POA: Diagnosis not present

## 2018-11-12 DIAGNOSIS — G8929 Other chronic pain: Secondary | ICD-10-CM | POA: Diagnosis not present

## 2018-11-12 DIAGNOSIS — R2689 Other abnormalities of gait and mobility: Secondary | ICD-10-CM | POA: Diagnosis not present

## 2018-11-12 DIAGNOSIS — M545 Low back pain: Secondary | ICD-10-CM | POA: Diagnosis not present

## 2018-11-12 DIAGNOSIS — M0600 Rheumatoid arthritis without rheumatoid factor, unspecified site: Secondary | ICD-10-CM | POA: Diagnosis not present

## 2018-11-12 DIAGNOSIS — I119 Hypertensive heart disease without heart failure: Secondary | ICD-10-CM | POA: Diagnosis not present

## 2018-11-12 DIAGNOSIS — M4807 Spinal stenosis, lumbosacral region: Secondary | ICD-10-CM | POA: Diagnosis not present

## 2018-11-17 DIAGNOSIS — M545 Low back pain: Secondary | ICD-10-CM | POA: Diagnosis not present

## 2018-11-17 DIAGNOSIS — G8929 Other chronic pain: Secondary | ICD-10-CM | POA: Diagnosis not present

## 2018-11-17 DIAGNOSIS — R2689 Other abnormalities of gait and mobility: Secondary | ICD-10-CM | POA: Diagnosis not present

## 2018-11-17 DIAGNOSIS — M0600 Rheumatoid arthritis without rheumatoid factor, unspecified site: Secondary | ICD-10-CM | POA: Diagnosis not present

## 2018-11-17 DIAGNOSIS — I119 Hypertensive heart disease without heart failure: Secondary | ICD-10-CM | POA: Diagnosis not present

## 2018-11-17 DIAGNOSIS — M4807 Spinal stenosis, lumbosacral region: Secondary | ICD-10-CM | POA: Diagnosis not present

## 2019-02-15 DIAGNOSIS — M0579 Rheumatoid arthritis with rheumatoid factor of multiple sites without organ or systems involvement: Secondary | ICD-10-CM | POA: Diagnosis not present

## 2019-02-15 DIAGNOSIS — E669 Obesity, unspecified: Secondary | ICD-10-CM | POA: Diagnosis not present

## 2019-02-15 DIAGNOSIS — Z79899 Other long term (current) drug therapy: Secondary | ICD-10-CM | POA: Diagnosis not present

## 2019-02-15 DIAGNOSIS — M810 Age-related osteoporosis without current pathological fracture: Secondary | ICD-10-CM | POA: Diagnosis not present

## 2019-02-15 DIAGNOSIS — Z6830 Body mass index (BMI) 30.0-30.9, adult: Secondary | ICD-10-CM | POA: Diagnosis not present

## 2019-02-15 DIAGNOSIS — M2550 Pain in unspecified joint: Secondary | ICD-10-CM | POA: Diagnosis not present

## 2019-02-18 DIAGNOSIS — Z79899 Other long term (current) drug therapy: Secondary | ICD-10-CM | POA: Diagnosis not present

## 2019-02-18 DIAGNOSIS — R519 Headache, unspecified: Secondary | ICD-10-CM | POA: Diagnosis not present

## 2019-02-18 DIAGNOSIS — M069 Rheumatoid arthritis, unspecified: Secondary | ICD-10-CM | POA: Diagnosis not present

## 2019-02-18 DIAGNOSIS — R2689 Other abnormalities of gait and mobility: Secondary | ICD-10-CM | POA: Diagnosis not present

## 2019-02-18 DIAGNOSIS — D8989 Other specified disorders involving the immune mechanism, not elsewhere classified: Secondary | ICD-10-CM | POA: Diagnosis not present

## 2019-02-18 DIAGNOSIS — E039 Hypothyroidism, unspecified: Secondary | ICD-10-CM | POA: Diagnosis not present

## 2019-02-18 DIAGNOSIS — M810 Age-related osteoporosis without current pathological fracture: Secondary | ICD-10-CM | POA: Diagnosis not present

## 2019-02-18 DIAGNOSIS — I10 Essential (primary) hypertension: Secondary | ICD-10-CM | POA: Diagnosis not present

## 2019-02-18 DIAGNOSIS — R918 Other nonspecific abnormal finding of lung field: Secondary | ICD-10-CM | POA: Diagnosis not present

## 2019-02-18 DIAGNOSIS — F410 Panic disorder [episodic paroxysmal anxiety] without agoraphobia: Secondary | ICD-10-CM | POA: Diagnosis not present

## 2019-02-18 DIAGNOSIS — D649 Anemia, unspecified: Secondary | ICD-10-CM | POA: Diagnosis not present

## 2019-02-18 DIAGNOSIS — M542 Cervicalgia: Secondary | ICD-10-CM | POA: Diagnosis not present

## 2019-05-17 DIAGNOSIS — R5382 Chronic fatigue, unspecified: Secondary | ICD-10-CM | POA: Diagnosis not present

## 2019-05-17 DIAGNOSIS — Z6830 Body mass index (BMI) 30.0-30.9, adult: Secondary | ICD-10-CM | POA: Diagnosis not present

## 2019-05-17 DIAGNOSIS — Z79899 Other long term (current) drug therapy: Secondary | ICD-10-CM | POA: Diagnosis not present

## 2019-05-17 DIAGNOSIS — M2550 Pain in unspecified joint: Secondary | ICD-10-CM | POA: Diagnosis not present

## 2019-05-17 DIAGNOSIS — M810 Age-related osteoporosis without current pathological fracture: Secondary | ICD-10-CM | POA: Diagnosis not present

## 2019-05-17 DIAGNOSIS — M0579 Rheumatoid arthritis with rheumatoid factor of multiple sites without organ or systems involvement: Secondary | ICD-10-CM | POA: Diagnosis not present

## 2019-05-17 DIAGNOSIS — E669 Obesity, unspecified: Secondary | ICD-10-CM | POA: Diagnosis not present

## 2019-07-22 DIAGNOSIS — E038 Other specified hypothyroidism: Secondary | ICD-10-CM | POA: Diagnosis not present

## 2019-07-22 DIAGNOSIS — M810 Age-related osteoporosis without current pathological fracture: Secondary | ICD-10-CM | POA: Diagnosis not present

## 2019-07-22 DIAGNOSIS — E7849 Other hyperlipidemia: Secondary | ICD-10-CM | POA: Diagnosis not present

## 2019-07-26 ENCOUNTER — Inpatient Hospital Stay (HOSPITAL_COMMUNITY)
Admission: EM | Admit: 2019-07-26 | Discharge: 2019-07-30 | DRG: 493 | Disposition: A | Discharge status: Skilled Nursing Facility | Payer: Medicare Other | Attending: Internal Medicine | Admitting: Internal Medicine

## 2019-07-26 ENCOUNTER — Emergency Department (HOSPITAL_COMMUNITY): Payer: Medicare Other

## 2019-07-26 ENCOUNTER — Inpatient Hospital Stay (HOSPITAL_COMMUNITY): Payer: Medicare Other

## 2019-07-26 ENCOUNTER — Encounter (HOSPITAL_COMMUNITY): Payer: Self-pay | Admitting: Internal Medicine

## 2019-07-26 DIAGNOSIS — I10 Essential (primary) hypertension: Secondary | ICD-10-CM | POA: Diagnosis present

## 2019-07-26 DIAGNOSIS — Z79899 Other long term (current) drug therapy: Secondary | ICD-10-CM | POA: Diagnosis not present

## 2019-07-26 DIAGNOSIS — H04129 Dry eye syndrome of unspecified lacrimal gland: Secondary | ICD-10-CM | POA: Diagnosis not present

## 2019-07-26 DIAGNOSIS — Z20822 Contact with and (suspected) exposure to covid-19: Secondary | ICD-10-CM | POA: Diagnosis present

## 2019-07-26 DIAGNOSIS — M8000XA Age-related osteoporosis with current pathological fracture, unspecified site, initial encounter for fracture: Secondary | ICD-10-CM | POA: Diagnosis not present

## 2019-07-26 DIAGNOSIS — M549 Dorsalgia, unspecified: Secondary | ICD-10-CM | POA: Diagnosis present

## 2019-07-26 DIAGNOSIS — F1190 Opioid use, unspecified, uncomplicated: Secondary | ICD-10-CM | POA: Diagnosis present

## 2019-07-26 DIAGNOSIS — M1990 Unspecified osteoarthritis, unspecified site: Secondary | ICD-10-CM | POA: Diagnosis present

## 2019-07-26 DIAGNOSIS — S82852A Displaced trimalleolar fracture of left lower leg, initial encounter for closed fracture: Secondary | ICD-10-CM | POA: Diagnosis not present

## 2019-07-26 DIAGNOSIS — E890 Postprocedural hypothyroidism: Secondary | ICD-10-CM | POA: Diagnosis present

## 2019-07-26 DIAGNOSIS — Z91041 Radiographic dye allergy status: Secondary | ICD-10-CM

## 2019-07-26 DIAGNOSIS — S8291XA Unspecified fracture of right lower leg, initial encounter for closed fracture: Secondary | ICD-10-CM | POA: Diagnosis not present

## 2019-07-26 DIAGNOSIS — Z96649 Presence of unspecified artificial hip joint: Secondary | ICD-10-CM

## 2019-07-26 DIAGNOSIS — S3993XA Unspecified injury of pelvis, initial encounter: Secondary | ICD-10-CM | POA: Diagnosis not present

## 2019-07-26 DIAGNOSIS — M069 Rheumatoid arthritis, unspecified: Secondary | ICD-10-CM | POA: Diagnosis present

## 2019-07-26 DIAGNOSIS — Z96642 Presence of left artificial hip joint: Secondary | ICD-10-CM | POA: Diagnosis not present

## 2019-07-26 DIAGNOSIS — Z8042 Family history of malignant neoplasm of prostate: Secondary | ICD-10-CM

## 2019-07-26 DIAGNOSIS — S8292XA Unspecified fracture of left lower leg, initial encounter for closed fracture: Secondary | ICD-10-CM | POA: Diagnosis not present

## 2019-07-26 DIAGNOSIS — S82842A Displaced bimalleolar fracture of left lower leg, initial encounter for closed fracture: Secondary | ICD-10-CM | POA: Diagnosis not present

## 2019-07-26 DIAGNOSIS — Z96621 Presence of right artificial elbow joint: Secondary | ICD-10-CM | POA: Diagnosis present

## 2019-07-26 DIAGNOSIS — Q780 Osteogenesis imperfecta: Secondary | ICD-10-CM | POA: Diagnosis not present

## 2019-07-26 DIAGNOSIS — S59901A Unspecified injury of right elbow, initial encounter: Secondary | ICD-10-CM | POA: Diagnosis not present

## 2019-07-26 DIAGNOSIS — Q7290 Unspecified reduction defect of unspecified lower limb: Secondary | ICD-10-CM

## 2019-07-26 DIAGNOSIS — Z79891 Long term (current) use of opiate analgesic: Secondary | ICD-10-CM | POA: Diagnosis not present

## 2019-07-26 DIAGNOSIS — S0990XA Unspecified injury of head, initial encounter: Secondary | ICD-10-CM | POA: Diagnosis not present

## 2019-07-26 DIAGNOSIS — I493 Ventricular premature depolarization: Secondary | ICD-10-CM | POA: Diagnosis present

## 2019-07-26 DIAGNOSIS — E876 Hypokalemia: Secondary | ICD-10-CM | POA: Diagnosis present

## 2019-07-26 DIAGNOSIS — S9301XA Subluxation of right ankle joint, initial encounter: Secondary | ICD-10-CM | POA: Diagnosis present

## 2019-07-26 DIAGNOSIS — D62 Acute posthemorrhagic anemia: Secondary | ICD-10-CM | POA: Diagnosis not present

## 2019-07-26 DIAGNOSIS — E785 Hyperlipidemia, unspecified: Secondary | ICD-10-CM | POA: Diagnosis not present

## 2019-07-26 DIAGNOSIS — F419 Anxiety disorder, unspecified: Secondary | ICD-10-CM | POA: Diagnosis not present

## 2019-07-26 DIAGNOSIS — Z7401 Bed confinement status: Secondary | ICD-10-CM | POA: Diagnosis not present

## 2019-07-26 DIAGNOSIS — M542 Cervicalgia: Secondary | ICD-10-CM | POA: Diagnosis present

## 2019-07-26 DIAGNOSIS — E8889 Other specified metabolic disorders: Secondary | ICD-10-CM | POA: Diagnosis present

## 2019-07-26 DIAGNOSIS — W19XXXA Unspecified fall, initial encounter: Secondary | ICD-10-CM | POA: Diagnosis not present

## 2019-07-26 DIAGNOSIS — S82851A Displaced trimalleolar fracture of right lower leg, initial encounter for closed fracture: Secondary | ICD-10-CM

## 2019-07-26 DIAGNOSIS — R0789 Other chest pain: Secondary | ICD-10-CM | POA: Diagnosis present

## 2019-07-26 DIAGNOSIS — Y92017 Garden or yard in single-family (private) house as the place of occurrence of the external cause: Secondary | ICD-10-CM

## 2019-07-26 DIAGNOSIS — R079 Chest pain, unspecified: Secondary | ICD-10-CM | POA: Diagnosis not present

## 2019-07-26 DIAGNOSIS — G8918 Other acute postprocedural pain: Secondary | ICD-10-CM | POA: Diagnosis not present

## 2019-07-26 DIAGNOSIS — W1830XA Fall on same level, unspecified, initial encounter: Secondary | ICD-10-CM | POA: Diagnosis present

## 2019-07-26 DIAGNOSIS — Z7952 Long term (current) use of systemic steroids: Secondary | ICD-10-CM

## 2019-07-26 DIAGNOSIS — M810 Age-related osteoporosis without current pathological fracture: Secondary | ICD-10-CM | POA: Diagnosis present

## 2019-07-26 DIAGNOSIS — M6281 Muscle weakness (generalized): Secondary | ICD-10-CM | POA: Diagnosis not present

## 2019-07-26 DIAGNOSIS — Z66 Do not resuscitate: Secondary | ICD-10-CM | POA: Diagnosis present

## 2019-07-26 DIAGNOSIS — S82891A Other fracture of right lower leg, initial encounter for closed fracture: Secondary | ICD-10-CM | POA: Diagnosis not present

## 2019-07-26 DIAGNOSIS — F410 Panic disorder [episodic paroxysmal anxiety] without agoraphobia: Secondary | ICD-10-CM | POA: Diagnosis present

## 2019-07-26 DIAGNOSIS — S82892A Other fracture of left lower leg, initial encounter for closed fracture: Secondary | ICD-10-CM

## 2019-07-26 DIAGNOSIS — E7800 Pure hypercholesterolemia, unspecified: Secondary | ICD-10-CM | POA: Diagnosis not present

## 2019-07-26 DIAGNOSIS — M80872A Other osteoporosis with current pathological fracture, left ankle and foot, initial encounter for fracture: Secondary | ICD-10-CM | POA: Diagnosis not present

## 2019-07-26 DIAGNOSIS — Z79890 Hormone replacement therapy (postmenopausal): Secondary | ICD-10-CM | POA: Diagnosis not present

## 2019-07-26 DIAGNOSIS — E039 Hypothyroidism, unspecified: Secondary | ICD-10-CM | POA: Diagnosis present

## 2019-07-26 DIAGNOSIS — S82851D Displaced trimalleolar fracture of right lower leg, subsequent encounter for closed fracture with routine healing: Secondary | ICD-10-CM | POA: Diagnosis not present

## 2019-07-26 DIAGNOSIS — Z419 Encounter for procedure for purposes other than remedying health state, unspecified: Secondary | ICD-10-CM

## 2019-07-26 DIAGNOSIS — G8929 Other chronic pain: Secondary | ICD-10-CM | POA: Diagnosis present

## 2019-07-26 DIAGNOSIS — M80871A Other osteoporosis with current pathological fracture, right ankle and foot, initial encounter for fracture: Secondary | ICD-10-CM | POA: Diagnosis present

## 2019-07-26 DIAGNOSIS — M2550 Pain in unspecified joint: Secondary | ICD-10-CM | POA: Diagnosis not present

## 2019-07-26 DIAGNOSIS — M545 Low back pain: Secondary | ICD-10-CM | POA: Diagnosis not present

## 2019-07-26 DIAGNOSIS — G894 Chronic pain syndrome: Secondary | ICD-10-CM | POA: Diagnosis present

## 2019-07-26 DIAGNOSIS — K2100 Gastro-esophageal reflux disease with esophagitis, without bleeding: Secondary | ICD-10-CM | POA: Diagnosis not present

## 2019-07-26 DIAGNOSIS — K219 Gastro-esophageal reflux disease without esophagitis: Secondary | ICD-10-CM | POA: Diagnosis present

## 2019-07-26 DIAGNOSIS — R404 Transient alteration of awareness: Secondary | ICD-10-CM | POA: Diagnosis not present

## 2019-07-26 DIAGNOSIS — E559 Vitamin D deficiency, unspecified: Secondary | ICD-10-CM | POA: Diagnosis not present

## 2019-07-26 DIAGNOSIS — J309 Allergic rhinitis, unspecified: Secondary | ICD-10-CM | POA: Diagnosis not present

## 2019-07-26 DIAGNOSIS — R41841 Cognitive communication deficit: Secondary | ICD-10-CM | POA: Diagnosis not present

## 2019-07-26 DIAGNOSIS — K5900 Constipation, unspecified: Secondary | ICD-10-CM | POA: Diagnosis not present

## 2019-07-26 DIAGNOSIS — T148XXA Other injury of unspecified body region, initial encounter: Secondary | ICD-10-CM

## 2019-07-26 DIAGNOSIS — M7989 Other specified soft tissue disorders: Secondary | ICD-10-CM | POA: Diagnosis not present

## 2019-07-26 DIAGNOSIS — S299XXA Unspecified injury of thorax, initial encounter: Secondary | ICD-10-CM | POA: Diagnosis not present

## 2019-07-26 DIAGNOSIS — Z87891 Personal history of nicotine dependence: Secondary | ICD-10-CM

## 2019-07-26 DIAGNOSIS — Z91048 Other nonmedicinal substance allergy status: Secondary | ICD-10-CM

## 2019-07-26 DIAGNOSIS — S82841D Displaced bimalleolar fracture of right lower leg, subsequent encounter for closed fracture with routine healing: Secondary | ICD-10-CM | POA: Diagnosis not present

## 2019-07-26 DIAGNOSIS — Z825 Family history of asthma and other chronic lower respiratory diseases: Secondary | ICD-10-CM

## 2019-07-26 DIAGNOSIS — Z8249 Family history of ischemic heart disease and other diseases of the circulatory system: Secondary | ICD-10-CM

## 2019-07-26 DIAGNOSIS — R52 Pain, unspecified: Secondary | ICD-10-CM

## 2019-07-26 DIAGNOSIS — I959 Hypotension, unspecified: Secondary | ICD-10-CM | POA: Diagnosis not present

## 2019-07-26 DIAGNOSIS — S82842D Displaced bimalleolar fracture of left lower leg, subsequent encounter for closed fracture with routine healing: Secondary | ICD-10-CM | POA: Diagnosis not present

## 2019-07-26 DIAGNOSIS — R278 Other lack of coordination: Secondary | ICD-10-CM | POA: Diagnosis not present

## 2019-07-26 DIAGNOSIS — M25471 Effusion, right ankle: Secondary | ICD-10-CM | POA: Diagnosis not present

## 2019-07-26 HISTORY — DX: Other fracture of right lower leg, initial encounter for closed fracture: S82.892A

## 2019-07-26 HISTORY — DX: Other fracture of right lower leg, initial encounter for closed fracture: S82.891A

## 2019-07-26 LAB — CBC WITH DIFFERENTIAL/PLATELET
Abs Immature Granulocytes: 0.04 10*3/uL (ref 0.00–0.07)
Basophils Absolute: 0.1 10*3/uL (ref 0.0–0.1)
Basophils Relative: 1 %
Eosinophils Absolute: 0 10*3/uL (ref 0.0–0.5)
Eosinophils Relative: 0 %
HCT: 36.8 % (ref 36.0–46.0)
Hemoglobin: 11.5 g/dL — ABNORMAL LOW (ref 12.0–15.0)
Immature Granulocytes: 0 %
Lymphocytes Relative: 9 %
Lymphs Abs: 0.9 10*3/uL (ref 0.7–4.0)
MCH: 28 pg (ref 26.0–34.0)
MCHC: 31.3 g/dL (ref 30.0–36.0)
MCV: 89.8 fL (ref 80.0–100.0)
Monocytes Absolute: 0.7 10*3/uL (ref 0.1–1.0)
Monocytes Relative: 6 %
Neutro Abs: 8.9 10*3/uL — ABNORMAL HIGH (ref 1.7–7.7)
Neutrophils Relative %: 84 %
Platelets: 396 10*3/uL (ref 150–400)
RBC: 4.1 MIL/uL (ref 3.87–5.11)
RDW: 18.4 % — ABNORMAL HIGH (ref 11.5–15.5)
WBC: 10.6 10*3/uL — ABNORMAL HIGH (ref 4.0–10.5)
nRBC: 0 % (ref 0.0–0.2)

## 2019-07-26 LAB — BASIC METABOLIC PANEL
Anion gap: 12 (ref 5–15)
BUN: 13 mg/dL (ref 8–23)
CO2: 28 mmol/L (ref 22–32)
Calcium: 9.1 mg/dL (ref 8.9–10.3)
Chloride: 100 mmol/L (ref 98–111)
Creatinine, Ser: 0.79 mg/dL (ref 0.44–1.00)
GFR calc Af Amer: >60 mL/min (ref >60)
GFR calc non Af Amer: >60 mL/min (ref >60)
Glucose, Bld: 115 mg/dL — ABNORMAL HIGH (ref 70–99)
Potassium: 3.1 mmol/L — ABNORMAL LOW (ref 3.5–5.1)
Sodium: 140 mmol/L (ref 135–145)

## 2019-07-26 LAB — SARS CORONAVIRUS 2 BY RT PCR (HOSPITAL ORDER, PERFORMED IN ~~LOC~~ HOSPITAL LAB): SARS Coronavirus 2: NEGATIVE

## 2019-07-26 MED ORDER — MORPHINE SULFATE (PF) 2 MG/ML IV SOLN
2.0000 mg | INTRAVENOUS | Status: DC | PRN
  Administered 2019-07-26: 2 mg via INTRAVENOUS
  Filled 2019-07-26: qty 1

## 2019-07-26 MED ORDER — ACETAMINOPHEN 325 MG PO TABS: 650.0000 mg | ORAL_TABLET | Freq: Four times a day (QID) | ORAL | Status: DC | PRN

## 2019-07-26 MED ORDER — PANTOPRAZOLE SODIUM 40 MG PO TBEC
40.0000 mg | DELAYED_RELEASE_TABLET | Freq: Every day | ORAL | Status: DC
  Administered 2019-07-27 – 2019-07-30 (×4): 40 mg via ORAL
  Filled 2019-07-26 (×5): qty 1

## 2019-07-26 MED ORDER — BISACODYL 5 MG PO TBEC: 5.0000 mg | DELAYED_RELEASE_TABLET | Freq: Every day | ORAL | Status: DC | PRN

## 2019-07-26 MED ORDER — LEVOTHYROXINE SODIUM 100 MCG PO TABS
100.0000 ug | ORAL_TABLET | Freq: Every day | ORAL | Status: DC
  Administered 2019-07-27 – 2019-07-30 (×4): 100 ug via ORAL
  Filled 2019-07-26 (×4): qty 1

## 2019-07-26 MED ORDER — PROPOFOL 10 MG/ML IV BOLUS: 0.5000 mg/kg | Freq: Once | INTRAVENOUS | Status: DC

## 2019-07-26 MED ORDER — BENAZEPRIL HCL 20 MG PO TABS
20.0000 mg | ORAL_TABLET | Freq: Every morning | ORAL | Status: DC
  Administered 2019-07-27 – 2019-07-30 (×4): 20 mg via ORAL
  Filled 2019-07-26 (×4): qty 1

## 2019-07-26 MED ORDER — OXYCODONE HCL 5 MG PO TABS
5.0000 mg | ORAL_TABLET | ORAL | Status: DC | PRN
  Administered 2019-07-27: 5 mg via ORAL
  Filled 2019-07-26: qty 1

## 2019-07-26 MED ORDER — ACETAMINOPHEN 650 MG RE SUPP: 650.0000 mg | Freq: Four times a day (QID) | RECTAL | Status: DC | PRN

## 2019-07-26 MED ORDER — POTASSIUM CHLORIDE CRYS ER 20 MEQ PO TBCR
40.0000 meq | EXTENDED_RELEASE_TABLET | Freq: Once | ORAL | Status: AC
  Administered 2019-07-26: 40 meq via ORAL
  Filled 2019-07-26: qty 2

## 2019-07-26 MED ORDER — HYDRALAZINE HCL 20 MG/ML IJ SOLN
5.0000 mg | Freq: Four times a day (QID) | INTRAMUSCULAR | Status: DC | PRN
  Administered 2019-07-26: 5 mg via INTRAVENOUS
  Filled 2019-07-26: qty 1

## 2019-07-26 MED ORDER — METHOCARBAMOL 1000 MG/10ML IJ SOLN
500.0000 mg | Freq: Four times a day (QID) | INTRAVENOUS | Status: DC | PRN
  Filled 2019-07-26: qty 5

## 2019-07-26 MED ORDER — KETAMINE HCL 10 MG/ML IJ SOLN: 1.0000 mg/kg | Freq: Once | INTRAMUSCULAR | Status: DC

## 2019-07-26 MED ORDER — PREDNISONE 5 MG PO TABS
5.0000 mg | ORAL_TABLET | Freq: Every day | ORAL | Status: DC
  Administered 2019-07-27 – 2019-07-30 (×4): 5 mg via ORAL
  Filled 2019-07-26 (×4): qty 1

## 2019-07-26 MED ORDER — KETAMINE HCL 10 MG/ML IJ SOLN
INTRAMUSCULAR | Status: AC
  Administered 2019-07-26: 40 mg via INTRAVENOUS
  Filled 2019-07-26: qty 1

## 2019-07-26 MED ORDER — ONDANSETRON HCL 4 MG/2ML IJ SOLN: 4.0000 mg | Freq: Four times a day (QID) | INTRAMUSCULAR | Status: DC | PRN

## 2019-07-26 MED ORDER — ONDANSETRON HCL 4 MG PO TABS: 4.0000 mg | ORAL_TABLET | Freq: Four times a day (QID) | ORAL | Status: DC | PRN

## 2019-07-26 MED ORDER — LORAZEPAM 0.5 MG PO TABS
0.2500 mg | ORAL_TABLET | Freq: Two times a day (BID) | ORAL | Status: DC | PRN
  Administered 2019-07-26: 0.5 mg via ORAL
  Filled 2019-07-26 (×2): qty 1

## 2019-07-26 MED ORDER — PROPOFOL 10 MG/ML IV BOLUS
INTRAVENOUS | Status: AC
  Administered 2019-07-26: 40 mg via INTRAVENOUS
  Filled 2019-07-26: qty 20

## 2019-07-26 MED ORDER — AMLODIPINE BESYLATE 5 MG PO TABS
5.0000 mg | ORAL_TABLET | Freq: Every morning | ORAL | Status: DC
  Administered 2019-07-27 – 2019-07-30 (×4): 5 mg via ORAL
  Filled 2019-07-26 (×4): qty 1

## 2019-07-26 MED ORDER — SENNOSIDES-DOCUSATE SODIUM 8.6-50 MG PO TABS: 1.0000 | ORAL_TABLET | Freq: Every evening | ORAL | Status: DC | PRN

## 2019-07-26 MED ORDER — KCL IN DEXTROSE-NACL 20-5-0.9 MEQ/L-%-% IV SOLN
INTRAVENOUS | Status: DC
  Filled 2019-07-26 (×6): qty 1000

## 2019-07-26 NOTE — H&P (Signed)
History and Physical    TAMMY WICKLIFFE QMV:[id number] DOB: Jul 16, 1928 DOA: 07/26/2019  PCP: [name], MD   Patient coming from: home Chief Complaint  Patient presents with  . Fall     HPI: MAIZEY MENENDEZ is a 86 y.o. female with medical history significant for hypertension,GERD, hyperlipidemia, hypothyroidism  anxiety, osteoporosis/chronic neck and back pain/ arthritis/RA,  Hx of evision of left total hip/right artificial elbow joint comes to the ED after a fall at home after returning from her mailbox and complaining of pain in both of her ankles.  Was complaining of pain in the left side of the chest as well after the fall and reports as a gas pain and reports she gets this intermittently at other x2.  Patient's daughter at the bedside assisting with the history.  Patient has limited functional ability due to her chronic pain arthritis, lives with her daughter who helps with most of her history of but when she walks around the house she denies any chest pain shortness of breath, no known history of CAD, CKD, CVA.  She had her last surgery 5 years ago.  Patient denies any nausea, vomiting, shortness of breath, fever, chills, diarrhea, focal weakness.  ED Course: Blood pressure running on higher side 160s to 185/79, routine lab work fairly stable with hypokalemia, EKG in the ED personally reviewed shows sinus rhythm, PVC.  X-ray right ankle showed trimalleolar fracture subluxation of the right ankle, Xrau Left Ankle: "Left ankle bimalleolar fracture with complex fracture plane demonstrating both transverse and vertically oriented oblique fracture planes involving the medial malleolus".CT head with no acute finding. Ankles  were reduced under sedation  And plaint applied in ED and Ortho was consulted advised transfer to [facility] for operative intervention tomorrow.  Review of Systems: All systems were reviewed and were negative except as mentioned in HPI above. Negative for fever Negative for  focal weakness Negative for shortness of breath  Past Medical History:  Diagnosis Date  . Anxiety    HX OF PANIC ATTACKS  . Arthritis    RA  . Chronic neck and back pain   . Chronic pain   . Elbow locking    due to RA- elbow with limited extension  . GERD (gastroesophageal reflux disease)   . Headache   . Hyperlipidemia   . Hypertension   . Hypothyroidism   . Osteoarthritis   . Osteoporosis   . Shortness of breath    occas  . UTI (urinary tract infection)     Past Surgical History:  Procedure Laterality Date  . CATARACT EXTRACTION Bilateral   . COLONOSCOPY    . ELBOW SURGERY Right   . ELBOW SURGERY Left   . ESOPHAGOGASTRODUODENOSCOPY    . HARDWARE REMOVAL Right 07/13/2015   Procedure:  WITH HARDWARE REMOVAL REPAIR AS NECESSARY;  Surgeon: Roseanne Kaufman, MD;  Location: [facility];  Service: Orthopedics;  Laterality: Right;  . JOINT REPLACEMENT  1998   left hip replacement and bilateral elbow replacements  about 15 yrs ago  . thyroidectomy    . TOTAL ELBOW ARTHROPLASTY Right 07/13/2015   Procedure: RIGHT ELBOW REVISION TOTAL ELBOW ARTHROPLASTY ;  Surgeon: Roseanne Kaufman, MD;  Location: [place];  Service: Orthopedics;  Laterality: Right;  . TOTAL HIP ARTHROPLASTY    . TOTAL HIP REVISION  03/11/2011   Procedure: TOTAL HIP REVISION;  Surgeon: [name], MD;  Location: [facility];  Service: Orthopedics;  Laterality: Left;  . TRICEPS TENDON REPAIR Right 07/13/2015  Procedure:  TRICEP RECONSTRUCTION REPAIR WITH ALLOGRAFT, ULNAR NERVE NEUROLYSIS;  Surgeon: Roseanne Kaufman, MD;  Location: [place];  Service: Orthopedics;  Laterality: Right;     reports that she has quit smoking. She has never used smokeless tobacco. She reports that she does not drink alcohol and does not use drugs.  Allergies  Allergen Reactions  . Contrast Media [Iodinated Diagnostic Agents] Other (See Comments)    Itching.( 13 hr pre medication regimen worked fine. )  . Tdap [Tetanus-Diphth-Acell Pertussis] Hives  .  Iodine     Family History  Problem Relation Age of Onset  . Heart attack Mother   . Prostate cancer Father   . Lung disease Brother   . Heart attack Sister   . COPD Sister   . Cancer Sister        She does not know which type of cancer     Prior to Admission medications   Medication Sig Start Date End Date Taking? Authorizing Provider  amLODipine (NORVASC) 5 MG tablet Take 5 mg by mouth every morning.    [provider]  ARTIFICIAL TEAR OP Apply 1 drop to eye 2 (two) times daily as needed (DRY EYES).    [provider]  benazepril (LOTENSIN) 20 MG tablet Take 20 mg by mouth every morning.    [provider]  cholecalciferol (VITAMIN D) 1000 UNITS tablet Take 2,000 Units by mouth daily.    [provider]  denosumab (PROLIA) 60 MG/ML SOLN injection Inject 60 mg into the skin every 6 (six) months. Administer in upper arm, thigh, or abdomen    [provider]  fentaNYL (DURAGESIC - DOSED MCG/HR) 50 MCG/HR Place 50 mcg onto the skin every 3 (three) days.    [provider]  fexofenadine (ALLEGRA) 180 MG tablet Take 180 mg by mouth daily as needed (For allergies.).     [provider]  fluticasone (FLONASE) 50 MCG/ACT nasal spray Place 2 sprays into the nose as needed.     [provider]  folic acid (FOLVITE) 1 MG tablet Take 1 mg by mouth daily.    [provider]  furosemide (LASIX) 20 MG tablet Take 20 mg by mouth every other day as needed for fluid (For ankle swelling).     [provider]  levothyroxine (SYNTHROID, LEVOTHROID) 100 MCG tablet Take 100 mcg by mouth daily before breakfast.    [provider]  LORazepam (ATIVAN) 0.5 MG tablet Take 0.25-5 mg by mouth 2 (two) times daily as needed for anxiety.    [provider]  methotrexate (RHEUMATREX) 10 MG tablet Take 10 mg by mouth once a week. Caution: Chemotherapy. Protect from light.    [provider]  Omega-3 Fatty  Acids (FISH OIL) 1000 MG CAPS Take 1 capsule by mouth daily.    [provider]  omeprazole (PRILOSEC) 20 MG capsule Take 20 mg by mouth every other day.     [provider]  oxyCODONE-acetaminophen (PERCOCET) 10-325 MG tablet Take 1 tablet by mouth every 8 (eight) hours as needed for pain. Pain 10/04/14   [provider]  predniSONE (DELTASONE) 5 MG tablet Take 1 tablet (5 mg total) by mouth daily. 02/15/12   Sheryle Hail, NP  rosuvastatin (CRESTOR) 20 MG tablet Take 10 mg by mouth every 3 (three) days.     [provider]  sertraline (ZOLOFT) 50 MG tablet Take 50 mg by mouth daily.     [provider]  Physical Exam: Vitals:   07/26/19 1815 07/26/19 1830 07/26/19 1845 07/26/19 1900  BP: (!) 167/84 (!) 194/97 (!) 186/99 (!) 194/104  Pulse: 87 91 86 87  Resp: 15 14 14 16   Temp:      TempSrc:      SpO2: 99% 100% 98% 98%   General exam: AAO x3, elderly, NAD, weak appearing. HEENT:Oral mucosa moist, Ear/Nose WNL grossly, dentition normal. Respiratory system: bilaterally clear,no wheezing or crackles,no use of accessory muscle Cardiovascular system: S1 & S2 +, No JVD,. Gastrointestinal system: Abdomen soft, NT,ND, BS+ Nervous System:Alert, awake, moving extremities and grossly nonfocal Extremities: No edema, distal peripheral pulses palpable.  Splint present on bilateral ankle with crepe bandage. Skin: No rashes,no icterus. MSK: Normal muscle bulk,tone, power   Labs on Admission: I have personally reviewed following labs and imaging studies  CBC: Recent Labs  Lab 07/26/19 1531  WBC 10.6*  NEUTROABS 8.9*  HGB 11.5*  HCT 36.8  MCV 89.8  PLT 025   Basic Metabolic Panel: Recent Labs  Lab 07/26/19 1531  NA 140  K 3.1*  CL 100  CO2 28  GLUCOSE 115*  BUN 13  CREATININE 0.79  CALCIUM 9.1   GFR: CrCl cannot be calculated (Unknown ideal weight.). Liver Function Tests: No results for input(s): AST, ALT, ALKPHOS, BILITOT, PROT,  ALBUMIN in the last 168 hours. No results for input(s): LIPASE, AMYLASE in the last 168 hours. No results for input(s): AMMONIA in the last 168 hours. Coagulation Profile: No results for input(s): INR, PROTIME in the last 168 hours. Cardiac Enzymes: No results for input(s): CKTOTAL, CKMB, CKMBINDEX, TROPONINI in the last 168 hours. BNP (last 3 results) No results for input(s): PROBNP in the last 8760 hours. HbA1C: No results for input(s): HGBA1C in the last 72 hours. CBG: No results for input(s): GLUCAP in the last 168 hours. Lipid Profile: No results for input(s): CHOL, HDL, LDLCALC, TRIG, CHOLHDL, LDLDIRECT in the last 72 hours. Thyroid Function Tests: No results for input(s): TSH, T4TOTAL, FREET4, T3FREE, THYROIDAB in the last 72 hours. Anemia Panel: No results for input(s): VITAMINB12, FOLATE, FERRITIN, TIBC, IRON, RETICCTPCT in the last 72 hours. Urine analysis:    Component Value Date/Time   COLORURINE YELLOW 10/31/2014 1240   APPEARANCEUR CLEAR 10/31/2014 1240   LABSPEC 1.012 10/31/2014 1240   PHURINE 7.5 10/31/2014 1240   GLUCOSEU NEGATIVE 10/31/2014 1240   HGBUR SMALL (A) 10/31/2014 1240   BILIRUBINUR NEGATIVE 10/31/2014 1240   KETONESUR NEGATIVE 10/31/2014 1240   PROTEINUR NEGATIVE 10/31/2014 1240   UROBILINOGEN 0.2 10/31/2014 1240   NITRITE NEGATIVE 10/31/2014 1240   LEUKOCYTESUR NEGATIVE 10/31/2014 1240    Radiological Exams on Admission: DG Ribs Unilateral W/Chest Left  Result Date: 07/26/2019 CLINICAL DATA:  Fall, bilateral ankle fracture, chest pain EXAM: LEFT RIBS AND CHEST - 3+ VIEW COMPARISON:  None. FINDINGS: No fracture or other bone lesions are seen involving the ribs. There is no evidence of pneumothorax or pleural effusion. Both lungs are clear. Heart size and mediastinal contours are within normal limits. Bilateral elbow arthroplasty is incidentally noted. IMPRESSION: Negative. Electronically Signed   By: Fidela Salisbury MD   On: 07/26/2019 17:21   DG  Pelvis 1-2 Views  Result Date: 07/26/2019 CLINICAL DATA:  Fall, bilateral ankle fracture EXAM: PELVIS - 1-2 VIEW COMPARISON:  03/11/2011 FINDINGS: The osseous structures are mildly osteopenic. Surgical changes of left total hip arthroplasty are identified. Arthroplasty components overlie the expected position. No fracture or dislocation identified. Limited evaluation of the right  hip is unremarkable. Heterotopic ossification surrounds the left hip. Vascular calcifications are seen within the pelvis. IMPRESSION: No acute fracture or dislocation. Electronically Signed   By: Fidela Salisbury MD   On: 07/26/2019 17:20   DG Ankle 2 Views Right  Result Date: 07/26/2019 CLINICAL DATA:  Post reduction of the right ankle EXAM: RIGHT ANKLE - 2 VIEW COMPARISON:  Radiograph 07/26/2019 FINDINGS: Post closed reduction and splinting of the previously seen trimalleolar fracture of the right ankle including an oblique trans syndesmotic distal fibular fracture, transversely oriented medial malleolar fracture and posterior malleolar fracture which are in grossly improved alignment from comparison. Some persistent asymmetric widening of the medial clear space suggestive of some residual talar shift in the setting of this unstable ankle injury. Extensive circumferential soft tissue swelling of the ankle and ankle joint effusion are better visualized on the un splinted images. Severe talonavicular arthrosis is similar to prior. Bidirectional calcaneal spurs are again seen. Midfoot and hindfoot alignment is grossly preserved though incompletely assessed on nondedicated, nonweightbearing films. IMPRESSION: 1. Post closed reduction and splinting of trimalleolar fracture of the right ankle, in grossly improved alignment from comparison albeit with some residual mild fracture displacement and lateral talar shift. 2. Additional swelling and ankle effusion, better seen on prior. 3. Fracture pattern compatible with a Weber B stage IV  unstable ankle injury. Electronically Signed   By: Lovena Le M.D.   On: 07/26/2019 18:39   DG Ankle Complete Left  Result Date: 07/26/2019 CLINICAL DATA:  Fall, left ankle tenderness EXAM: LEFT ANKLE COMPLETE - 3+ VIEW COMPARISON:  None. FINDINGS: Three view radiograph of left ankle excludes the a posterior calcaneus on lateral examination. Bimalleolar fracture of the left ankle is identified. Complex fracture plane involving the medial malleolus is identified with a transverse fracture plane noted posteriorly, with a more oblique, vertically oriented component anteriorly with resultant mild displacement and minimal medial angulation of the medial malleolar fracture fragment. Oblique fracture of the distal fibular metaphysis is also identified with the fracture plane extending to the tibial plafond with minimal lateral angulation of the distal fracture fragment. Extensive soft tissue swelling superficial to the a lateral malleolus. No other fracture identified. IMPRESSION: Left ankle bimalleolar fracture with complex fracture plane demonstrating both transverse and vertically oriented oblique fracture planes involving the medial malleolus. Electronically Signed   By: Fidela Salisbury MD   On: 07/26/2019 17:18   DG Ankle Complete Right  Result Date: 07/26/2019 CLINICAL DATA:  Fall, right ankle tenderness EXAM: RIGHT ANKLE - COMPLETE 3+ VIEW COMPARISON:  None. FINDINGS: Three view radiograph of the right ankle excludes the posterior aspect of the right heel on lateral examination. These images demonstrate a trimalleolar fracture of the right ankle. Oblique fracture of the a distal right fibular metaphysis at the level of the tibial plafond with 1/2 shaft with lateral displacement and approximately 7-8 mm override and approximately 25 mm posterolateral angulation of the distal fracture fragment. Transverse fracture of the medial malleolus with minimal lateral angulation approximately 1 cm lateral displacement  of the distal fracture fragment Minimally displaced fracture of the posterior malleolus with minimal posterolateral displacement of the fracture fragment. Resultant 1 cm lateral subluxation of the talar dome in relation to the tibial plafond. Superimposed severe talonavicular degenerative arthritis. Extensive bimalleolar soft tissue swelling. IMPRESSION: Trimalleolar fracture subluxation of the right ankle as described above. Electronically Signed   By: Fidela Salisbury MD   On: 07/26/2019 17:14   CT Head Wo Contrast  Result  Date: 07/26/2019 CLINICAL DATA:  Head trauma, head injury following fall EXAM: CT HEAD WITHOUT CONTRAST TECHNIQUE: Contiguous axial images were obtained from the base of the skull through the vertex without intravenous contrast. COMPARISON:  09/23/2018 FINDINGS: Brain: Normal anatomic configuration. Moderate periventricular white matter changes are present likely reflecting the sequela of small vessel ischemia. Remote lacunar infarct within the anterior limb of the right internal capsule is again identified. No abnormal intra or extra-axial mass lesion or fluid collection. No abnormal mass effect or midline shift. No evidence of acute intracranial hemorrhage or infarct. Ventricular size is normal. Cerebellum unremarkable. Vascular: Extensive vascular calcifications are again seen within the distal left vertebral artery and proximal basilar artery. No hyperdense vasculature at the skull base. Skull: Intact Sinuses/Orbits: Paranasal sinuses are clear. Orbits are unremarkable. Other: Mastoid air cells and middle ear cavities are clear. IMPRESSION: No acute intracranial injury.  No calvarial fracture. Electronically Signed   By: Fidela Salisbury MD   On: 07/26/2019 17:09   Assessment/Plan  Bilateral ankle fracture: Trimalleolar on the right and bimalleolar on the left.  Fracture reduced under sedation and splinted in the ED. Dr. Griffin Basil was consulted and has requested admission to [facility] for  likely surgical repair tomorrow.  Patient family wanted to discuss with orthopedic regarding surgery.  Patient seems to have limited functional capacity however no known CAD,CKD, CVA or DM.  EKG sinus rhythm with PVC.  Hypokalemia: We will replete potassium.  Keep on gentle IV fluid hydration with potassium  Hypertension: BP uncontrolled likely from pain. Resume home meds amlodipine and lisinopril, add prn iv meds.  Monitor blood pressure.  GERD: Continue PPI  Hyperlipidemia: Continue statin once med rec is done  Hypothyroidism : Continue Synthroid once med rec is done  Anxiety disorder: Continue Zoloft, and Ativan- patient reports she takes Ativan every morning.  Osteoporosis/chronic neck and back pain/ arthritis on chronic percocet: Continue pain management as ordered for fracture  RA on chronic steroid: Resume prednisone 5 mg patient reports he does not take steroid consistently.  Final med rec is pending please review and resume her home medication after completing med rec  There is no height or weight on file to calculate BMI.   Severity of Illness:  * I certify that at the point of admission it is my clinical judgment that the patient will require inpatient hospital care spanning beyond 2 midnights from the point of admission due to high intensity of service, high risk for further deterioration and high frequency of surveillance required due to bilateral ankle fracture and need for further consult service and operative intervention    DVT prophylaxis: SCD, holding heparin/Lovenox due to possible surgery- if surgery is delayed consider restarting anticoagulation or post op Code Status:   Code Status: DNR as per pt's wishes- daughter Jerolyn Shin at bedside Family Communication: Admission, patients condition and plan of care including tests being ordered have been discussed with the patient and daughter who indicate understanding and agree with the plan and Code Status.  Consults  called:  Ortho by EDP  Antonieta Pert MD [facility]  If 7PM-7AM, please contact night-coverage [email or web address]  07/26/2019, 7:21 PM

## 2019-07-26 NOTE — ED Notes (Signed)
Pt repositioned in bed, family and patient updated on status of transfer to [facility]. Splints noted to bilateral extremities. Nasal cannula removed from pt, O2 sat 97% RA.

## 2019-07-26 NOTE — ED Triage Notes (Signed)
Transported by [facility] from home-- had a mechanical fall today causing injury to right ankle. EMS administered 150 mcg of Fentanyl PTA via IV. AAO x 3; Denies any LOC or hitting head. 18 G IV in left forearm.

## 2019-07-26 NOTE — Anesthesia Preprocedure Evaluation (Addendum)
Anesthesia Evaluation  Patient identified by MRN, date of birth, ID band Patient awake    Reviewed: Allergy & Precautions, NPO status , Patient's Chart, lab work & pertinent test results  History of Anesthesia Complications Negative for: history of anesthetic complications  Airway Mallampati: II  TM Distance: >3 FB Neck ROM: Full    Dental  (+) Edentulous Lower, Edentulous Upper   Pulmonary former smoker,    Pulmonary exam normal        Cardiovascular hypertension, Pt. on medications Normal cardiovascular exam     Neuro/Psych Anxiety negative neurological ROS     GI/Hepatic Neg liver ROS, GERD  Medicated and Controlled,  Endo/Other  Hypothyroidism   Renal/GU negative Renal ROS  negative genitourinary   Musculoskeletal  (+) Arthritis , Rheumatoid disorders,    Abdominal   Peds  Hematology negative hematology ROS (+)   Anesthesia Other Findings Day of surgery medications reviewed with patient.  Reproductive/Obstetrics negative OB ROS                            Anesthesia Physical Anesthesia Plan  ASA: II  Anesthesia Plan: General   Post-op Pain Management: GA combined w/ Regional for post-op pain   Induction: Intravenous  PONV Risk Score and Plan: 3 and Treatment may vary due to age or medical condition, Ondansetron and Dexamethasone  Airway Management Planned: Oral ETT  Additional Equipment: None  Intra-op Plan:   Post-operative Plan: Extubation in OR  Informed Consent: I have reviewed the patients History and Physical, chart, labs and discussed the procedure including the risks, benefits and alternatives for the proposed anesthesia with the patient or authorized representative who has indicated his/her understanding and acceptance.   Patient has DNR.  Discussed DNR with patient and Continue DNR.   Dental advisory given  Plan Discussed with: CRNA  Anesthesia Plan  Comments:      Anesthesia Quick Evaluation

## 2019-07-26 NOTE — ED Provider Notes (Addendum)
[facility] DEPT Provider Note   CSN: [id number] Arrival date & time: 07/26/19  1352     History Chief Complaint  Patient presents with  . Fall    Talishia Betzler Tellez is a 86 y.o. female.  HPI     86 year old female comes in a chief complaint of fall. Patient had a mechanical fall while returning from her mailbox.  She is complaining of pain in both of her ankles.  She also complaining of pain to the left side of her chest.  Patient is not on any blood thinners.  She is denying any severe headache, neck pain, nausea, vomiting, vision change, focal numbness or weakness.  Past Medical History:  Diagnosis Date  . Anxiety    HX OF PANIC ATTACKS  . Arthritis    RA  . Chronic neck and back pain   . Chronic pain   . Elbow locking    due to RA- elbow with limited extension  . GERD (gastroesophageal reflux disease)   . Headache   . Hyperlipidemia   . Hypertension   . Hypothyroidism   . Osteoarthritis   . Osteoporosis   . Shortness of breath    occas  . UTI (urinary tract infection)     Patient Active Problem List   Diagnosis Date Noted  . Bilateral ankle fractures 07/26/2019  . Right elbow pain 07/14/2015  . Presence of right artificial elbow joint 07/13/2015  . Hypertension   . Hypothyroidism   . Hyperlipidemia   . GERD (gastroesophageal reflux disease)   . Anxiety   . Osteoporosis   . Chronic neck and back pain   . 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 CATARACT EXTRACTION Bilateral   . COLONOSCOPY    . ELBOW SURGERY Right   . ELBOW SURGERY Left   . ESOPHAGOGASTRODUODENOSCOPY    . HARDWARE REMOVAL Right 07/13/2015   Procedure:  WITH HARDWARE REMOVAL REPAIR AS NECESSARY;  Surgeon: Roseanne Kaufman, MD;  Location: [place] [facility];  Service: Orthopedics;  Laterality: Right;  . JOINT REPLACEMENT  1998   left hip replacement and bilateral elbow replacements  about 15 yrs ago  .  thyroidectomy    . TOTAL ELBOW ARTHROPLASTY Right 07/13/2015   Procedure: RIGHT ELBOW REVISION TOTAL ELBOW ARTHROPLASTY ;  Surgeon: Roseanne Kaufman, MD;  Location: [facility];  Service: Orthopedics;  Laterality: Right;  . TOTAL HIP ARTHROPLASTY    . TOTAL HIP REVISION  03/11/2011   Procedure: TOTAL HIP REVISION;  Surgeon: [name], MD;  Location: [facility];  Service: Orthopedics;  Laterality: Left;  . TRICEPS TENDON REPAIR Right 07/13/2015   Procedure:  TRICEP RECONSTRUCTION REPAIR WITH ALLOGRAFT, ULNAR NERVE NEUROLYSIS;  Surgeon: Roseanne Kaufman, MD;  Location: [facility];  Service: Orthopedics;  Laterality: Right;     OB History   No obstetric history on file.     Family History  Problem Relation Age of Onset  . Heart attack Mother   . Prostate cancer Father   . Lung disease Brother   . Heart attack Sister   . COPD Sister   . Cancer Sister        She does not know which type of cancer    Social History   Tobacco Use  . Smoking status: Former Research scientist (life sciences)  . Smokeless tobacco: Never Used  . Tobacco comment: QUIT SMOKING IN THE 70"S  Substance Use Topics  . Alcohol use:  No  . Drug use: No    Home Medications Prior to Admission medications   Medication Sig Start Date End Date Taking? Authorizing Provider  amLODipine (NORVASC) 5 MG tablet Take 5 mg by mouth every morning.    [provider]  ARTIFICIAL TEAR OP Apply 1 drop to eye 2 (two) times daily as needed (DRY EYES).    [provider]  benazepril (LOTENSIN) 20 MG tablet Take 20 mg by mouth every morning.    [provider]  cholecalciferol (VITAMIN D) 1000 UNITS tablet Take 2,000 Units by mouth daily.    [provider]  denosumab (PROLIA) 60 MG/ML SOLN injection Inject 60 mg into the skin every 6 (six) months. Administer in upper arm, thigh, or abdomen    [provider]  fentaNYL (DURAGESIC - DOSED MCG/HR) 50 MCG/HR Place 50 mcg onto the skin every 3 (three) days.    [provider]    fexofenadine (ALLEGRA) 180 MG tablet Take 180 mg by mouth daily as needed (For allergies.).     [provider]  fluticasone (FLONASE) 50 MCG/ACT nasal spray Place 2 sprays into the nose as needed.     [provider]  folic acid (FOLVITE) 1 MG tablet Take 1 mg by mouth daily.    [provider]  furosemide (LASIX) 20 MG tablet Take 20 mg by mouth every other day as needed for fluid (For ankle swelling).     [provider]  levothyroxine (SYNTHROID, LEVOTHROID) 100 MCG tablet Take 100 mcg by mouth daily before breakfast.    [provider]  LORazepam (ATIVAN) 0.5 MG tablet Take 0.25-5 mg by mouth 2 (two) times daily as needed for anxiety.    [provider]  methotrexate (RHEUMATREX) 10 MG tablet Take 10 mg by mouth once a week. Caution: Chemotherapy. Protect from light.    [provider]  Omega-3 Fatty Acids (FISH OIL) 1000 MG CAPS Take 1 capsule by mouth daily.    [provider]  omeprazole (PRILOSEC) 20 MG capsule Take 20 mg by mouth every other day.     [provider]  oxyCODONE-acetaminophen (PERCOCET) 10-325 MG tablet Take 1 tablet by mouth every 8 (eight) hours as needed for pain. Pain 10/04/14   [provider]  predniSONE (DELTASONE) 5 MG tablet Take 1 tablet (5 mg total) by mouth daily. 02/15/12   Sheryle Hail, NP  rosuvastatin (CRESTOR) 20 MG tablet Take 10 mg by mouth every 3 (three) days.     [provider]  sertraline (ZOLOFT) 50 MG tablet Take 50 mg by mouth daily.     [provider]    Allergies    Contrast media [iodinated diagnostic agents], Tdap [tetanus-diphth-acell pertussis], and Iodine  Review of Systems   Review of Systems  Constitutional: Positive for activity change.  Musculoskeletal: Positive for arthralgias.  Neurological: Negative for headaches.  Hematological: Does not bruise/bleed easily.  All other systems reviewed and are negative.   Physical  Exam Updated Vital Signs BP (!) 186/90   Pulse 88   Temp 98.7 F (37.1 C) (Oral)   Resp 20   SpO2 99%   Physical Exam Vitals and nursing note reviewed.  Constitutional:      Appearance: She is well-developed.  HENT:     Head: Normocephalic and atraumatic.  Eyes:     Pupils: Pupils are equal, round, and reactive to light.  Neck:     Comments: No midline c-spine tenderness Cardiovascular:  Rate and Rhythm: Normal rate and regular rhythm.     Heart sounds: No murmur heard.   Pulmonary:     Effort: Pulmonary effort is normal. No respiratory distress.     Breath sounds: Normal breath sounds.  Chest:     Chest wall: No tenderness.  Abdominal:     General: Bowel sounds are normal. There is no distension.     Palpations: Abdomen is soft.     Tenderness: There is no abdominal tenderness.  Musculoskeletal:     Cervical back: Normal range of motion and neck supple.     Comments: No long bone tenderness - upper and lower extrmeities and no pelvic pain, instability.  Skin:    General: Skin is warm and dry.     Findings: No rash.  Neurological:     Mental Status: She is alert and oriented to person, place, and time.     Cranial Nerves: No cranial nerve deficit.     ED Results / Procedures / Treatments   Labs (all labs ordered are listed, but only abnormal results are displayed) Labs Reviewed  BASIC METABOLIC PANEL - Abnormal; Notable for the following components:      Result Value   Potassium 3.1 (*)    Glucose, Bld 115 (*)    All other components within normal limits  CBC WITH DIFFERENTIAL/PLATELET - Abnormal; Notable for the following components:   WBC 10.6 (*)    Hemoglobin 11.5 (*)    RDW 18.4 (*)    Neutro Abs 8.9 (*)    All other components within normal limits  SARS CORONAVIRUS 2 BY RT PCR (HOSPITAL ORDER, [facility])    EKG None   Date: 07/26/2019  Rate: 85  Rhythm: normal sinus rhythm  QRS Axis: normal  Intervals: normal   ST/T Wave abnormalities: normal  Conduction Disutrbances: none  Narrative Interpretation: unremarkable      Radiology DG Ribs Unilateral W/Chest Left  Result Date: 07/26/2019 CLINICAL DATA:  Fall, bilateral ankle fracture, chest pain EXAM: LEFT RIBS AND CHEST - 3+ VIEW COMPARISON:  None. FINDINGS: No fracture or other bone lesions are seen involving the ribs. There is no evidence of pneumothorax or pleural effusion. Both lungs are clear. Heart size and mediastinal contours are within normal limits. Bilateral elbow arthroplasty is incidentally noted. IMPRESSION: Negative. Electronically Signed   By: Fidela Salisbury MD   On: 07/26/2019 17:21   DG Pelvis 1-2 Views  Result Date: 07/26/2019 CLINICAL DATA:  Fall, bilateral ankle fracture EXAM: PELVIS - 1-2 VIEW COMPARISON:  03/11/2011 FINDINGS: The osseous structures are mildly osteopenic. Surgical changes of left total hip arthroplasty are identified. Arthroplasty components overlie the expected position. No fracture or dislocation identified. Limited evaluation of the right hip is unremarkable. Heterotopic ossification surrounds the left hip. Vascular calcifications are seen within the pelvis. IMPRESSION: No acute fracture or dislocation. Electronically Signed   By: Fidela Salisbury MD   On: 07/26/2019 17:20   DG Ankle Complete Left  Result Date: 07/26/2019 CLINICAL DATA:  Fall, left ankle tenderness EXAM: LEFT ANKLE COMPLETE - 3+ VIEW COMPARISON:  None. FINDINGS: Three view radiograph of left ankle excludes the a posterior calcaneus on lateral examination. Bimalleolar fracture of the left ankle is identified. Complex fracture plane involving the medial malleolus is identified with a transverse fracture plane noted posteriorly, with a more oblique, vertically oriented component anteriorly with resultant mild displacement and minimal medial angulation of the medial malleolar fracture fragment. Oblique fracture  of the distal fibular metaphysis is also  identified with the fracture plane extending to the tibial plafond with minimal lateral angulation of the distal fracture fragment. Extensive soft tissue swelling superficial to the a lateral malleolus. No other fracture identified. IMPRESSION: Left ankle bimalleolar fracture with complex fracture plane demonstrating both transverse and vertically oriented oblique fracture planes involving the medial malleolus. Electronically Signed   By: Fidela Salisbury MD   On: 07/26/2019 17:18   DG Ankle Complete Right  Result Date: 07/26/2019 CLINICAL DATA:  Fall, right ankle tenderness EXAM: RIGHT ANKLE - COMPLETE 3+ VIEW COMPARISON:  None. FINDINGS: Three view radiograph of the right ankle excludes the posterior aspect of the right heel on lateral examination. These images demonstrate a trimalleolar fracture of the right ankle. Oblique fracture of the a distal right fibular metaphysis at the level of the tibial plafond with 1/2 shaft with lateral displacement and approximately 7-8 mm override and approximately 25 mm posterolateral angulation of the distal fracture fragment. Transverse fracture of the medial malleolus with minimal lateral angulation approximately 1 cm lateral displacement of the distal fracture fragment Minimally displaced fracture of the posterior malleolus with minimal posterolateral displacement of the fracture fragment. Resultant 1 cm lateral subluxation of the talar dome in relation to the tibial plafond. Superimposed severe talonavicular degenerative arthritis. Extensive bimalleolar soft tissue swelling. IMPRESSION: Trimalleolar fracture subluxation of the right ankle as described above. Electronically Signed   By: Fidela Salisbury MD   On: 07/26/2019 17:14   CT Head Wo Contrast  Result Date: 07/26/2019 CLINICAL DATA:  Head trauma, head injury following fall EXAM: CT HEAD WITHOUT CONTRAST TECHNIQUE: Contiguous axial images were obtained from the base of the skull through the vertex without  intravenous contrast. COMPARISON:  09/23/2018 FINDINGS: Brain: Normal anatomic configuration. Moderate periventricular white matter changes are present likely reflecting the sequela of small vessel ischemia. Remote lacunar infarct within the anterior limb of the right internal capsule is again identified. No abnormal intra or extra-axial mass lesion or fluid collection. No abnormal mass effect or midline shift. No evidence of acute intracranial hemorrhage or infarct. Ventricular size is normal. Cerebellum unremarkable. Vascular: Extensive vascular calcifications are again seen within the distal left vertebral artery and proximal basilar artery. No hyperdense vasculature at the skull base. Skull: Intact Sinuses/Orbits: Paranasal sinuses are clear. Orbits are unremarkable. Other: Mastoid air cells and middle ear cavities are clear. IMPRESSION: No acute intracranial injury.  No calvarial fracture. Electronically Signed   By: Fidela Salisbury MD   On: 07/26/2019 17:09    Procedures .Sedation  Date/Time: 07/26/2019 6:03 PM Performed by: Varney Biles, MD Authorized by: Varney Biles, MD   Consent:    Consent obtained:  Written   Consent given by:  Patient   Risks discussed:  Allergic reaction, dysrhythmia, inadequate sedation, nausea, prolonged hypoxia resulting in organ damage, prolonged sedation necessitating reversal, respiratory compromise necessitating ventilatory assistance and intubation and vomiting   Alternatives discussed:  Analgesia without sedation, anxiolysis and regional anesthesia Universal protocol:    Procedure explained and questions answered to patient or proxy's satisfaction: yes     Relevant documents present and verified: yes     Test results available and properly labeled: yes     Imaging studies available: yes     Required blood products, implants, devices, and special equipment available: yes     Site/side marked: yes     Immediately prior to procedure a time out was  called: yes     Patient  identity confirmation method:  Verbally with patient Indications:    Procedure necessitating sedation performed by:  Physician performing sedation Pre-sedation assessment:    Time since last food or drink:  > 4 hours   ASA classification: class 4 - patient with severe systemic disease that is a constant threat to life     Neck mobility: normal     Mouth opening:  2 finger widths   Thyromental distance:  3 finger widths   Mallampati score:  II - soft palate, uvula, fauces visible   Pre-sedation assessments completed and reviewed: airway patency, cardiovascular function, hydration status, mental status, nausea/vomiting, pain level, respiratory function and temperature     Pre-sedation assessment completed:  07/26/2019 5:03 PM Immediate pre-procedure details:    Reassessment: Patient reassessed immediately prior to procedure     Reviewed: vital signs, relevant labs/tests and NPO status     Verified: bag valve mask available, emergency equipment available, intubation equipment available, IV patency confirmed, oxygen available and suction available   Procedure details (see MAR for exact dosages):    Preoxygenation:  Nasal cannula   Sedation:  Propofol   Intended level of sedation: deep   Intra-procedure monitoring:  Blood pressure monitoring, cardiac monitor, continuous pulse oximetry, frequent LOC assessments, frequent vital sign checks and continuous capnometry   Intra-procedure events: hypoxia     Total Provider sedation time (minutes):  25 Post-procedure details:    Post-sedation assessment completed:  07/26/2019 6:04 PM   Attendance: Constant attendance by certified staff until patient recovered     Recovery: Patient returned to pre-procedure baseline     Post-sedation assessments completed and reviewed: airway patency, cardiovascular function, hydration status, mental status, nausea/vomiting, pain level, respiratory function and temperature     Patient is stable  for discharge or admission: yes     Patient tolerance:  Tolerated with difficulty Reduction of fracture  Date/Time: 07/26/2019 6:05 PM Performed by: Varney Biles, MD Authorized by: Varney Biles, MD  Consent: Written consent obtained. Risks and benefits: risks, benefits and alternatives were discussed Consent given by: patient Patient understanding: patient states understanding of the procedure being performed Patient consent: the patient's understanding of the procedure matches consent given Procedure consent: procedure consent matches procedure scheduled Relevant documents: relevant documents present and verified Test results: test results available and properly labeled Site marked: the operative site was marked Imaging studies: imaging studies available Required items: required blood products, implants, devices, and special equipment available Patient identity confirmed: arm band Time out: Immediately prior to procedure a "time out" was called to verify the correct patient, procedure, equipment, support staff and site/side marked as required. Preparation: Patient was prepped and draped in the usual sterile fashion. Local anesthesia used: no  Anesthesia: Local anesthesia used: no Patient tolerance: patient tolerated the procedure well with no immediate complications  .Splint Application  Date/Time: 07/26/2019 5:50 PM Performed by: Varney Biles, MD Authorized by: Varney Biles, MD   Consent:    Consent obtained:  Verbal   Consent given by:  Patient   Risks discussed:  Pain, numbness and swelling Universal protocol:    Procedure explained and questions answered to patient or proxy's satisfaction: yes     Patient identity confirmed:  Arm band Pre-procedure details:    Sensation:  Normal Procedure details:    Laterality:  Left   Location:  Leg   Leg:  L lower leg   Strapping: yes     Splint type:  Short leg and ankle stirrup   Supplies:  Plaster, cotton padding  and Ortho-Glass Post-procedure details:    Sensation:  Normal   Patient tolerance of procedure:  Tolerated well, no immediate complications .Splint Application  Date/Time: 07/26/2019 5:58 PM Performed by: Varney Biles, MD Authorized by: Varney Biles, MD   Consent:    Consent obtained:  Verbal   Consent given by:  Patient   Risks discussed:  Numbness, pain and swelling   Alternatives discussed:  No treatment Universal protocol:    Procedure explained and questions answered to patient or proxy's satisfaction: yes     Patient identity confirmed:  Arm band Pre-procedure details:    Sensation:  Normal Procedure details:    Laterality:  Right   Location:  Leg   Leg:  R lower leg   Splint type:  Short leg and ankle stirrup   Supplies:  Elastic bandage, cotton padding, plaster and Ortho-Glass Post-procedure details:    Sensation:  Normal   Patient tolerance of procedure:  Tolerated well, no immediate complications   (including critical care time)  Medications Ordered in ED Medications  ketamine (KETALAR) injection 1 mg/kg (40 mg Intravenous Given 07/26/19 1743)  propofol (DIPRIVAN) 10 mg/mL bolus/IV push 0.5 mg/kg (40 mg Intravenous Given 07/26/19 1741)    ED Course  I have reviewed the triage vital signs and the nursing notes.  Pertinent labs & imaging results that were available during my care of the patient were reviewed by me and considered in my medical decision making (see chart for details).    MDM Rules/Calculators/A&P                          86 year old female comes in a chief complaint of mechanical fall. Based on the assessment, it appears that she likely has bilateral ankle fracture.  X-rays confirmed that she has a trimall fracture on the right side by mall fracture on the left side.  Discussed case with Dr. Griffin Basil, orthopedic surgery.  He would want the patient to be transferred to [facility] for likely surgical repair tomorrow.  Patient is consented for  fracture reduction and sedation.  Final Clinical Impression(s) / ED Diagnoses Final diagnoses:  Closed trimalleolar fracture of right ankle, initial encounter  Bimalleolar ankle fracture, left, closed, initial encounter    Rx / DC Orders ED Discharge Orders    None       Varney Biles, MD 07/26/19 [place], Hasan Douse, MD 07/27/19 1015

## 2019-07-26 NOTE — ED Notes (Signed)
[facility] dispatch notified for need of transport.  

## 2019-07-27 ENCOUNTER — Encounter (HOSPITAL_COMMUNITY): Payer: Self-pay | Admitting: Internal Medicine

## 2019-07-27 ENCOUNTER — Inpatient Hospital Stay (HOSPITAL_COMMUNITY): Payer: Medicare Other | Admitting: Anesthesiology

## 2019-07-27 ENCOUNTER — Encounter (HOSPITAL_COMMUNITY)
Admission: EM | Disposition: A | Discharge status: Skilled Nursing Facility | Payer: Self-pay | Source: Home / Self Care | Attending: Internal Medicine

## 2019-07-27 ENCOUNTER — Inpatient Hospital Stay (HOSPITAL_COMMUNITY): Payer: Medicare Other

## 2019-07-27 ENCOUNTER — Other Ambulatory Visit: Payer: Self-pay

## 2019-07-27 ENCOUNTER — Inpatient Hospital Stay: Admit: 2019-07-27 | Payer: BLUE CROSS/BLUE SHIELD | Admitting: Orthopedic Surgery

## 2019-07-27 HISTORY — PX: ORIF ANKLE FRACTURE: SHX5408

## 2019-07-27 LAB — COMPREHENSIVE METABOLIC PANEL
ALT: 12 U/L (ref 0–44)
AST: 27 U/L (ref 15–41)
Albumin: 3.4 g/dL — ABNORMAL LOW (ref 3.5–5.0)
Alkaline Phosphatase: 63 U/L (ref 38–126)
Anion gap: 11 (ref 5–15)
BUN: 7 mg/dL — ABNORMAL LOW (ref 8–23)
CO2: 23 mmol/L (ref 22–32)
Calcium: 8.7 mg/dL — ABNORMAL LOW (ref 8.9–10.3)
Chloride: 105 mmol/L (ref 98–111)
Creatinine, Ser: 0.79 mg/dL (ref 0.44–1.00)
GFR calc Af Amer: >60 mL/min (ref >60)
GFR calc non Af Amer: >60 mL/min (ref >60)
Glucose, Bld: 197 mg/dL — ABNORMAL HIGH (ref 70–99)
Potassium: 3.3 mmol/L — ABNORMAL LOW (ref 3.5–5.1)
Sodium: 139 mmol/L (ref 135–145)
Total Bilirubin: 0.8 mg/dL (ref 0.3–1.2)
Total Protein: 6.2 g/dL — ABNORMAL LOW (ref 6.5–8.1)

## 2019-07-27 LAB — CREATININE, SERUM
Creatinine, Ser: 0.75 mg/dL (ref 0.44–1.00)
GFR calc Af Amer: >60 mL/min (ref >60)
GFR calc non Af Amer: >60 mL/min (ref >60)

## 2019-07-27 LAB — SURGICAL PCR SCREEN
MRSA, PCR: NEGATIVE
Staphylococcus aureus: NEGATIVE

## 2019-07-27 LAB — CBC
HCT: 32.5 % — ABNORMAL LOW (ref 36.0–46.0)
Hemoglobin: 10.1 g/dL — ABNORMAL LOW (ref 12.0–15.0)
MCH: 28.2 pg (ref 26.0–34.0)
MCHC: 31.1 g/dL (ref 30.0–36.0)
MCV: 90.8 fL (ref 80.0–100.0)
Platelets: 333 10*3/uL (ref 150–400)
RBC: 3.58 MIL/uL — ABNORMAL LOW (ref 3.87–5.11)
RDW: 18.6 % — ABNORMAL HIGH (ref 11.5–15.5)
WBC: 9.1 10*3/uL (ref 4.0–10.5)
nRBC: 0 % (ref 0.0–0.2)

## 2019-07-27 LAB — MAGNESIUM: Magnesium: 1.7 mg/dL (ref 1.7–2.4)

## 2019-07-27 SURGERY — OPEN REDUCTION INTERNAL FIXATION (ORIF) ANKLE FRACTURE
Anesthesia: General | Site: Ankle | Laterality: Bilateral

## 2019-07-27 MED ORDER — PROPOFOL 10 MG/ML IV BOLUS
INTRAVENOUS | Status: DC | PRN
  Administered 2019-07-27: 120 mg via INTRAVENOUS

## 2019-07-27 MED ORDER — OXYCODONE HCL 5 MG/5ML PO SOLN: 5.0000 mg | Freq: Once | ORAL | Status: DC | PRN

## 2019-07-27 MED ORDER — FENTANYL CITRATE (PF) 250 MCG/5ML IJ SOLN
INTRAMUSCULAR | Status: DC | PRN
  Administered 2019-07-27 (×5): 50 ug via INTRAVENOUS

## 2019-07-27 MED ORDER — ONDANSETRON HCL 4 MG PO TABS: 4.0000 mg | ORAL_TABLET | Freq: Four times a day (QID) | ORAL | Status: DC | PRN

## 2019-07-27 MED ORDER — CHLORHEXIDINE GLUCONATE 0.12 % MT SOLN: 15.0000 mL | Freq: Once | OROMUCOSAL | Status: AC

## 2019-07-27 MED ORDER — FENTANYL CITRATE (PF) 100 MCG/2ML IJ SOLN: 25.0000 ug | INTRAMUSCULAR | Status: DC | PRN

## 2019-07-27 MED ORDER — PROPOFOL 10 MG/ML IV BOLUS
INTRAVENOUS | Status: AC
  Filled 2019-07-27: qty 20

## 2019-07-27 MED ORDER — SUGAMMADEX SODIUM 200 MG/2ML IV SOLN
INTRAVENOUS | Status: DC | PRN
  Administered 2019-07-27: 200 mg via INTRAVENOUS

## 2019-07-27 MED ORDER — LACTATED RINGERS IV SOLN: INTRAVENOUS | Status: DC | PRN

## 2019-07-27 MED ORDER — ALBUMIN HUMAN 5 % IV SOLN: INTRAVENOUS | Status: DC | PRN

## 2019-07-27 MED ORDER — OXYCODONE HCL 5 MG PO TABS
5.0000 mg | ORAL_TABLET | ORAL | Status: DC | PRN
  Administered 2019-07-28: 10 mg via ORAL
  Administered 2019-07-28 (×3): 5 mg via ORAL
  Administered 2019-07-29: 10 mg via ORAL
  Filled 2019-07-27 (×2): qty 1
  Filled 2019-07-27: qty 2
  Filled 2019-07-27: qty 1
  Filled 2019-07-27: qty 2

## 2019-07-27 MED ORDER — LIDOCAINE 2% (20 MG/ML) 5 ML SYRINGE
INTRAMUSCULAR | Status: DC | PRN
  Administered 2019-07-27: 60 mg via INTRAVENOUS

## 2019-07-27 MED ORDER — PHENYLEPHRINE HCL (PRESSORS) 10 MG/ML IV SOLN
INTRAVENOUS | Status: DC | PRN
  Administered 2019-07-27: 80 ug via INTRAVENOUS

## 2019-07-27 MED ORDER — EPHEDRINE SULFATE 50 MG/ML IJ SOLN
INTRAMUSCULAR | Status: DC | PRN
  Administered 2019-07-27 (×2): 5 mg via INTRAVENOUS

## 2019-07-27 MED ORDER — DOCUSATE SODIUM 100 MG PO CAPS
100.0000 mg | ORAL_CAPSULE | Freq: Two times a day (BID) | ORAL | Status: DC
  Filled 2019-07-27: qty 1

## 2019-07-27 MED ORDER — POLYETHYLENE GLYCOL 3350 17 G PO PACK
17.0000 g | PACK | Freq: Two times a day (BID) | ORAL | Status: DC
  Administered 2019-07-27 – 2019-07-29 (×5): 17 g via ORAL
  Filled 2019-07-27 (×6): qty 1

## 2019-07-27 MED ORDER — FENTANYL CITRATE (PF) 250 MCG/5ML IJ SOLN
INTRAMUSCULAR | Status: AC
  Filled 2019-07-27: qty 5

## 2019-07-27 MED ORDER — PHENYLEPHRINE HCL-NACL 10-0.9 MG/250ML-% IV SOLN
INTRAVENOUS | Status: DC | PRN
  Administered 2019-07-27: 25 ug/min via INTRAVENOUS

## 2019-07-27 MED ORDER — ROCURONIUM BROMIDE 10 MG/ML (PF) SYRINGE
PREFILLED_SYRINGE | INTRAVENOUS | Status: DC | PRN
  Administered 2019-07-27 (×2): 50 mg via INTRAVENOUS

## 2019-07-27 MED ORDER — CEFAZOLIN SODIUM-DEXTROSE 1-4 GM/50ML-% IV SOLN
1.0000 g | Freq: Four times a day (QID) | INTRAVENOUS | Status: AC
  Administered 2019-07-27 – 2019-07-28 (×3): 1 g via INTRAVENOUS
  Filled 2019-07-27 (×3): qty 50

## 2019-07-27 MED ORDER — ACETAMINOPHEN 500 MG PO TABS
1000.0000 mg | ORAL_TABLET | Freq: Once | ORAL | Status: AC
  Administered 2019-07-27: 1000 mg via ORAL
  Filled 2019-07-27: qty 2

## 2019-07-27 MED ORDER — ACETAMINOPHEN 325 MG PO TABS
650.0000 mg | ORAL_TABLET | Freq: Three times a day (TID) | ORAL | Status: DC
  Administered 2019-07-27 – 2019-07-30 (×8): 650 mg via ORAL
  Filled 2019-07-27 (×8): qty 2

## 2019-07-27 MED ORDER — METOCLOPRAMIDE HCL 5 MG PO TABS: 5.0000 mg | ORAL_TABLET | Freq: Three times a day (TID) | ORAL | Status: DC | PRN

## 2019-07-27 MED ORDER — ONDANSETRON HCL 4 MG/2ML IJ SOLN: 4.0000 mg | Freq: Four times a day (QID) | INTRAMUSCULAR | Status: DC | PRN

## 2019-07-27 MED ORDER — ENOXAPARIN SODIUM 40 MG/0.4ML ~~LOC~~ SOLN
40.0000 mg | SUBCUTANEOUS | Status: DC
  Administered 2019-07-28 – 2019-07-30 (×3): 40 mg via SUBCUTANEOUS
  Filled 2019-07-27 (×3): qty 0.4

## 2019-07-27 MED ORDER — BUPIVACAINE-EPINEPHRINE (PF) 0.25% -1:200000 IJ SOLN
INTRAMUSCULAR | Status: DC | PRN
  Administered 2019-07-27: 10 mL via PERINEURAL
  Administered 2019-07-27: 20 mL via PERINEURAL
  Administered 2019-07-27: 10 mL via PERINEURAL
  Administered 2019-07-27: 20 mL via PERINEURAL

## 2019-07-27 MED ORDER — OXYCODONE HCL 5 MG PO TABS: 5.0000 mg | ORAL_TABLET | Freq: Once | ORAL | Status: DC | PRN

## 2019-07-27 MED ORDER — CEFAZOLIN SODIUM-DEXTROSE 2-4 GM/100ML-% IV SOLN
INTRAVENOUS | Status: AC
  Filled 2019-07-27: qty 100

## 2019-07-27 MED ORDER — CEFAZOLIN SODIUM-DEXTROSE 2-3 GM-%(50ML) IV SOLR
INTRAVENOUS | Status: DC | PRN
  Administered 2019-07-27: 2 g via INTRAVENOUS

## 2019-07-27 MED ORDER — FENTANYL CITRATE (PF) 100 MCG/2ML IJ SOLN
INTRAMUSCULAR | Status: AC
  Filled 2019-07-27: qty 2

## 2019-07-27 MED ORDER — METOCLOPRAMIDE HCL 5 MG/ML IJ SOLN: 5.0000 mg | Freq: Three times a day (TID) | INTRAMUSCULAR | Status: DC | PRN

## 2019-07-27 MED ORDER — MIDAZOLAM HCL 2 MG/2ML IJ SOLN
INTRAMUSCULAR | Status: AC
  Filled 2019-07-27: qty 2

## 2019-07-27 MED ORDER — 0.9 % SODIUM CHLORIDE (POUR BTL) OPTIME
TOPICAL | Status: DC | PRN
  Administered 2019-07-27: 1000 mL

## 2019-07-27 MED ORDER — ACETAMINOPHEN 500 MG PO TABS
1000.0000 mg | ORAL_TABLET | Freq: Three times a day (TID) | ORAL | Status: DC
  Administered 2019-07-27: 1000 mg via ORAL
  Filled 2019-07-27: qty 2

## 2019-07-27 MED ORDER — CLONIDINE HCL (ANALGESIA) 100 MCG/ML EP SOLN
EPIDURAL | Status: DC | PRN
  Administered 2019-07-27: 16.5 ug
  Administered 2019-07-27: 33.5 ug
  Administered 2019-07-27: 16.5 ug
  Administered 2019-07-27: 33.5 ug

## 2019-07-27 MED ORDER — DEXAMETHASONE SODIUM PHOSPHATE 10 MG/ML IJ SOLN
INTRAMUSCULAR | Status: DC | PRN
  Administered 2019-07-27: 10 mg via INTRAVENOUS

## 2019-07-27 MED ORDER — CHLORHEXIDINE GLUCONATE 0.12 % MT SOLN
OROMUCOSAL | Status: AC
  Administered 2019-07-27: 15 mL via OROMUCOSAL
  Filled 2019-07-27: qty 15

## 2019-07-27 MED ORDER — MIDAZOLAM HCL 2 MG/2ML IJ SOLN
INTRAMUSCULAR | Status: DC | PRN
  Administered 2019-07-27: 1 mg via INTRAVENOUS

## 2019-07-27 MED ORDER — AMISULPRIDE (ANTIEMETIC) 5 MG/2ML IV SOLN: 10.0000 mg | Freq: Once | INTRAVENOUS | Status: DC | PRN

## 2019-07-27 MED ORDER — ONDANSETRON HCL 4 MG/2ML IJ SOLN
INTRAMUSCULAR | Status: DC | PRN
  Administered 2019-07-27: 4 mg via INTRAVENOUS

## 2019-07-27 MED ORDER — DOCUSATE SODIUM 100 MG PO CAPS
200.0000 mg | ORAL_CAPSULE | Freq: Two times a day (BID) | ORAL | Status: DC
  Administered 2019-07-27 – 2019-07-30 (×5): 200 mg via ORAL
  Filled 2019-07-27 (×5): qty 2

## 2019-07-27 SURGICAL SUPPLY — 81 items
BANDAGE ESMARK 6X9 LF (GAUZE/BANDAGES/DRESSINGS) ×1 IMPLANT
BIT DRILL 2.4X140 LONG SOLID (BIT) ×2 IMPLANT
BIT DRILL CANNULTD 2.6 X 130MM (DRILL) IMPLANT
BIT DRILL SOLID 2.0 X 110MM (DRILL) IMPLANT
BNDG CMPR 9X6 STRL LF SNTH (GAUZE/BANDAGES/DRESSINGS) ×1
BNDG ELASTIC 4X5.8 VLCR STR LF (GAUZE/BANDAGES/DRESSINGS) ×4 IMPLANT
BNDG ELASTIC 6X5.8 VLCR STR LF (GAUZE/BANDAGES/DRESSINGS) ×4 IMPLANT
BNDG ESMARK 6X9 LF (GAUZE/BANDAGES/DRESSINGS) ×3
BNDG GAUZE ELAST 4 BULKY (GAUZE/BANDAGES/DRESSINGS) ×10 IMPLANT
BRUSH SCRUB EZ PLAIN DRY (MISCELLANEOUS) ×6 IMPLANT
CANISTER SUCT 3000ML PPV (MISCELLANEOUS) ×2 IMPLANT
COVER MAYO STAND STRL (DRAPES) ×1 IMPLANT
COVER SURGICAL LIGHT HANDLE (MISCELLANEOUS) ×3 IMPLANT
COVER WAND RF STERILE (DRAPES) ×3 IMPLANT
CUFF TOURN SGL QUICK 34 (TOURNIQUET CUFF)
CUFF TRNQT CYL 34X4.125X (TOURNIQUET CUFF) ×1 IMPLANT
DRAPE C-ARM 42X72 X-RAY (DRAPES) ×3 IMPLANT
DRAPE C-ARMOR (DRAPES) ×3 IMPLANT
DRAPE HALF SHEET 40X57 (DRAPES) ×3 IMPLANT
DRAPE U-SHAPE 47X51 STRL (DRAPES) ×5 IMPLANT
DRILL CANNULATED 2.6 X 130MM (DRILL) ×3
DRILL SOLID 2.0 X 110MM (DRILL) ×3
DRSG EMULSION OIL 3X3 NADH (GAUZE/BANDAGES/DRESSINGS) IMPLANT
DRSG MEPITEL 4X7.2 (GAUZE/BANDAGES/DRESSINGS) ×4 IMPLANT
DRSG PAD ABDOMINAL 8X10 ST (GAUZE/BANDAGES/DRESSINGS) ×4 IMPLANT
ELECT REM PT RETURN 9FT ADLT (ELECTROSURGICAL) ×3
ELECTRODE REM PT RTRN 9FT ADLT (ELECTROSURGICAL) ×1 IMPLANT
GAUZE SPONGE 4X4 12PLY STRL (GAUZE/BANDAGES/DRESSINGS) ×6 IMPLANT
GLOVE BIO SURGEON STRL SZ7.5 (GLOVE) ×5 IMPLANT
GLOVE BIO SURGEON STRL SZ8 (GLOVE) ×5 IMPLANT
GLOVE BIOGEL PI IND STRL 7.5 (GLOVE) ×1 IMPLANT
GLOVE BIOGEL PI IND STRL 8 (GLOVE) ×1 IMPLANT
GLOVE BIOGEL PI INDICATOR 7.5 (GLOVE) ×2
GLOVE BIOGEL PI INDICATOR 8 (GLOVE) ×2
GOWN STRL REUS W/ TWL LRG LVL3 (GOWN DISPOSABLE) ×2 IMPLANT
GOWN STRL REUS W/ TWL XL LVL3 (GOWN DISPOSABLE) ×1 IMPLANT
GOWN STRL REUS W/TWL LRG LVL3 (GOWN DISPOSABLE) ×6
GOWN STRL REUS W/TWL XL LVL3 (GOWN DISPOSABLE) ×3
K-WIRE SNGL END 1.2X150 (MISCELLANEOUS) ×6
KIT BASIN OR (CUSTOM PROCEDURE TRAY) ×3 IMPLANT
KIT TURNOVER KIT B (KITS) ×3 IMPLANT
KWIRE SNGL END 1.2X150 (MISCELLANEOUS) IMPLANT
MANIFOLD NEPTUNE II (INSTRUMENTS) ×1 IMPLANT
NDL HYPO 21X1.5 SAFETY (NEEDLE) IMPLANT
NEEDLE HYPO 21X1.5 SAFETY (NEEDLE) IMPLANT
NS IRRIG 1000ML POUR BTL (IV SOLUTION) ×3 IMPLANT
PACK FOAM VITOSS 5CC (Orthopedic Implant) ×2 IMPLANT
PACK GENERAL/GYN (CUSTOM PROCEDURE TRAY) ×3 IMPLANT
PACK ORTHO EXTREMITY (CUSTOM PROCEDURE TRAY) ×3 IMPLANT
PAD ARMBOARD 7.5X6 YLW CONV (MISCELLANEOUS) ×6 IMPLANT
PAD CAST 4YDX4 CTTN HI CHSV (CAST SUPPLIES) IMPLANT
PADDING CAST COTTON 4X4 STRL (CAST SUPPLIES) ×9
PADDING CAST COTTON 6X4 STRL (CAST SUPPLIES) ×6 IMPLANT
PLATE FIB CL 9H LT (Plate) ×2 IMPLANT
PLATE FIBULA 9HOLE ANATOMICAL (Plate) ×2 IMPLANT
SCREW 3.5X16 NONLOCKING (Screw) ×2 IMPLANT
SCREW 4.0X28 STRL (Screw) ×2 IMPLANT
SCREW 4.0X30MM (Screw) ×9 IMPLANT
SCREW BN ST 30X4XCANN HD (Screw) IMPLANT
SCREW LOCK PLATE R3 2.7X14 (Screw) ×2 IMPLANT
SCREW LOCK PLATE R3 2.7X16 ×6 IMPLANT
SCREW LOCK PLATE R3 2.7X17 (Screw) ×4 IMPLANT
SCREW LOCK PLATE R3 2.7X18 (Screw) ×6 IMPLANT
SCREW LOCK PLATE R3 3.5X12 (Screw) ×2 IMPLANT
SCREW LOCK PLATE R3 3.5X14 (Screw) ×10 IMPLANT
SCREW NON LOCKING 3.5X14 (Screw) ×4 IMPLANT
SPONGE LAP 18X18 RF (DISPOSABLE) ×3 IMPLANT
STAPLER VISISTAT 35W (STAPLE) ×2 IMPLANT
SUCTION FRAZIER HANDLE 10FR (MISCELLANEOUS) ×3
SUCTION TUBE FRAZIER 10FR DISP (MISCELLANEOUS) ×1 IMPLANT
SUT ETHILON 3 0 PS 1 (SUTURE) ×16 IMPLANT
SUT PDS AB 2-0 CT1 27 (SUTURE) ×2 IMPLANT
SUT VIC AB 2-0 CT1 27 (SUTURE) ×6
SUT VIC AB 2-0 CT1 TAPERPNT 27 (SUTURE) ×2 IMPLANT
SYR CONTROL 10ML LL (SYRINGE) ×2 IMPLANT
TOWEL GREEN STERILE (TOWEL DISPOSABLE) ×6 IMPLANT
TOWEL GREEN STERILE FF (TOWEL DISPOSABLE) ×3 IMPLANT
TUBE CONNECTING 12'X1/4 (SUCTIONS) ×1
TUBE CONNECTING 12X1/4 (SUCTIONS) ×2 IMPLANT
UNDERPAD 30X36 HEAVY ABSORB (UNDERPADS AND DIAPERS) ×7 IMPLANT
WATER STERILE IRR 1000ML POUR (IV SOLUTION) ×1 IMPLANT

## 2019-07-27 NOTE — Anesthesia Procedure Notes (Signed)
Procedure Name: Intubation Date/Time: 07/27/2019 8:59 AM Performed by: Clearnce Sorrel, CRNA Pre-anesthesia Checklist: Patient identified, Emergency Drugs available, Suction available, Patient being monitored and Timeout performed Patient Re-evaluated:Patient Re-evaluated prior to induction Oxygen Delivery Method: Circle system utilized Preoxygenation: Pre-oxygenation with 100% oxygen Induction Type: IV induction Ventilation: Mask ventilation without difficulty and Oral airway inserted - appropriate to patient size Laryngoscope Size: Mac and 3 Grade View: Grade I Tube type: Oral Tube size: 7.0 mm Number of attempts: 1 Airway Equipment and Method: Stylet Placement Confirmation: ETT inserted through vocal cords under direct vision,  positive ETCO2 and breath sounds checked- equal and bilateral Secured at: 22 cm Tube secured with: Tape Dental Injury: Teeth and Oropharynx as per pre-operative assessment

## 2019-07-27 NOTE — Transfer of Care (Signed)
Immediate Anesthesia Transfer of Care Note  Patient: Jocelyn Bauer  Procedure(s) Performed: OPEN REDUCTION INTERNAL FIXATION (ORIF) ANKLE FRACTURE (Bilateral Ankle)  Patient Location: PACU  Anesthesia Type:General and Regional  Level of Consciousness: sedated  Airway & Oxygen Therapy: Patient Spontanous Breathing and Patient connected to face mask oxygen  Post-op Assessment: Report given to RN and Post -op Vital signs reviewed and stable  Post vital signs: Reviewed and stable  Last Vitals:  Vitals Value Taken Time  BP    Temp    Pulse    Resp    SpO2      Last Pain:  Vitals:   07/27/19 0740  TempSrc:   PainSc: 0-No pain         Complications: No complications documented.

## 2019-07-27 NOTE — Progress Notes (Signed)
PROGRESS NOTE    Jocelyn Bauer  QBH:[id number] DOB: 06-08-1928 DOA: 07/26/2019 PCP: [name], MD   Brief Narrative:Jocelyn Bauer is a 86 y.o. female with medical history significant for hypertension,GERD, hyperlipidemia, hypothyroidism  anxiety, osteoporosis/chronic neck and back pain/ arthritis/RA,  Hx of evision of left total hip/right artificial elbow joint comes to the ED after a fall at home after returning from her mailbox and complaining of pain in both of her ankles.  Was complaining of pain in the left side of the chest as well after the fall and reports as a gas pain and reports she gets this intermittently at other x2.  Patient's daughter at the bedside assisting with the history.  Patient has limited functional ability due to her chronic pain arthritis, lives with her daughter who helps with most of her history of but when she walks around the house she denies any chest pain shortness of breath, no known history of CAD, CKD, CVA.  She had her last surgery 5 years ago.  Patient denies any nausea, vomiting, shortness of breath, fever, chills, diarrhea, focal weakness.  ED Course: Blood pressure running on higher side 160s to 185/79, routine lab work fairly stable with hypokalemia, EKG in the ED personally reviewed shows sinus rhythm, PVC.  X-ray right ankle showed trimalleolar fracture subluxation of the right ankle, Xrau Left Ankle: "Left ankle bimalleolar fracture with complex fracture plane demonstrating both transverse and vertically oriented oblique fracture planes involving the medial malleolus".CT head with no acute finding. Ankles  were reduced under sedation  And plaint applied in ED and Ortho was consulted advised transfer to [facility] for operative intervention tomorrow.  Assessment & Plan:   Active Problems:   S/P revision of left total hip   Presence of right artificial elbow joint   Hypertension   Hypothyroidism   Hyperlipidemia   GERD (gastroesophageal reflux  disease)   Anxiety   Osteoporosis   Chronic neck and back pain   Bilateral ankle fractures    #1 bilateral ankle fracture status post fall status post ORIF 07/27/2019.  Pain controlled with Tylenol morphine and Robaxin.  DVT prophylaxis with Lovenox.  Add bowel softeners and MiraLAX.  #2 history of essential hypertension on Norvasc and Lotensin blood pressure 156/66.  #3 hyperlipidemia omega-3 at home  #4 hypothyroidism continue Synthroid  #5 anxiety disorder on Ativan as needed at home.  #6 rheumatoid arthritis on steroids at home, methotrexate on hold.  #7 osteoporosis/rheumatoid arthritis on vitamin D with atorvastatin folic acid at home.  #8 hypokalemia potassium was 3.1 yesterday we will recheck labs today along with magnesium.  Repleted.  #9 chronic pain patient on fentanyl patch and Percocet at home.   Estimated body mass index is 28.46 kg/m as calculated from the following:   Height as of this encounter: 5\' 4"  (1.626 m).   Weight as of this encounter: 75.2 kg.  DVT prophylaxis: Lovenox Code Status: DNR Family Communication family at bedside Disposition Plan:  Status is: Inpatient  Dispo: The patient is from: Home              Anticipated d/c is to: SNF              Anticipated d/c date is: > 3 days              Patient currently is not medically stable to d/c.  Admitted with acute bilateral ankle fracture status post repair today    Consultants:   Orthopedics  Procedures: Bilateral ankle fracture ORIF Antimicrobials none Subjective: Resting in bed daughter by the bed side  Objective: Vitals:   07/27/19 1230 07/27/19 1245 07/27/19 1300 07/27/19 1342  BP: 135/65 (!) 114/94 (!) 145/57 (!) 156/66  Pulse: 82 83 83 80  Resp: 16 14 13 15   Temp:   (!) 97.3 F (36.3 C) 97.9 F (36.6 C)  TempSrc:    Oral  SpO2: 97% 96% 95% 94%  Weight:      Height:        Intake/Output Summary (Last 24 hours) at 07/27/2019 1430 Last data filed at 07/27/2019 1300 Gross  per 24 hour  Intake 2488.83 ml  Output 2470 ml  Net 18.83 ml   Filed Weights   07/26/19 2225  Weight: 75.2 kg    Examination:  General exam: Appears calm and comfortable  Respiratory system: Clear to auscultation. Respiratory effort normal. Cardiovascular system: S1 & S2 heard, RRR. No JVD, murmurs, rubs, gallops or clicks. No pedal edema. Gastrointestinal system: Abdomen is nondistended, soft and nontender. No organomegaly or masses felt. Normal bowel sounds heard. Central nervous system: Alert and oriented. No focal neurological deficits. Extremities:covered with dressings b/l Skin: No rashes, lesions or ulcers Psychiatry: Judgement and insight appear normal. Mood & affect appropriate.     Data Reviewed: I have personally reviewed following labs and imaging studies  CBC: Recent Labs  Lab 07/26/19 1531  WBC 10.6*  NEUTROABS 8.9*  HGB 11.5*  HCT 36.8  MCV 89.8  PLT 902   Basic Metabolic Panel: Recent Labs  Lab 07/26/19 1531  NA 140  K 3.1*  CL 100  CO2 28  GLUCOSE 115*  BUN 13  CREATININE 0.79  CALCIUM 9.1   GFR: Estimated Creatinine Clearance: 47.3 mL/min (by C-G formula based on SCr of 0.79 mg/dL). Liver Function Tests: No results for input(s): AST, ALT, ALKPHOS, BILITOT, PROT, ALBUMIN in the last 168 hours. No results for input(s): LIPASE, AMYLASE in the last 168 hours. No results for input(s): AMMONIA in the last 168 hours. Coagulation Profile: No results for input(s): INR, PROTIME in the last 168 hours. Cardiac Enzymes: No results for input(s): CKTOTAL, CKMB, CKMBINDEX, TROPONINI in the last 168 hours. BNP (last 3 results) No results for input(s): PROBNP in the last 8760 hours. HbA1C: No results for input(s): HGBA1C in the last 72 hours. CBG: No results for input(s): GLUCAP in the last 168 hours. Lipid Profile: No results for input(s): CHOL, HDL, LDLCALC, TRIG, CHOLHDL, LDLDIRECT in the last 72 hours. Thyroid Function Tests: No results for  input(s): TSH, T4TOTAL, FREET4, T3FREE, THYROIDAB in the last 72 hours. Anemia Panel: No results for input(s): VITAMINB12, FOLATE, FERRITIN, TIBC, IRON, RETICCTPCT in the last 72 hours. Sepsis Labs: No results for input(s): PROCALCITON, LATICACIDVEN in the last 168 hours.  Recent Results (from the past 240 hour(s))  SARS Coronavirus 2 by RT PCR (hospital order, performed in [facility] hospital lab) Nasopharyngeal Nasopharyngeal Swab     Status: None   Collection Time: 07/26/19  5:10 PM   Specimen: Nasopharyngeal Swab  Result Value Ref Range Status   SARS Coronavirus 2 NEGATIVE NEGATIVE Final    Comment: (NOTE) SARS-CoV-2 target nucleic acids are NOT DETECTED.  The SARS-CoV-2 RNA is generally detectable in upper and lower respiratory specimens during the acute phase of infection. The lowest concentration of SARS-CoV-2 viral copies this assay can detect is 250 copies / mL. A negative result does not preclude SARS-CoV-2 infection and should not be used as the sole  basis for treatment or other patient management decisions.  A negative result may occur with improper specimen collection / handling, submission of specimen other than nasopharyngeal swab, presence of viral mutation(s) within the areas targeted by this assay, and inadequate number of viral copies (<250 copies / mL). A negative result must be combined with clinical observations, patient history, and epidemiological information.  Fact Sheet for Patients:   StrictlyIdeas.no  Fact Sheet for Healthcare Providers: BankingDealers.co.za  This test is not yet approved or  cleared by the [place] FDA and has been authorized for detection and/or diagnosis of SARS-CoV-2 by FDA under an Emergency Use Authorization (EUA).  This EUA will remain in effect (meaning this test can be used) for the duration of the COVID-19 declaration under Section 564(b)(1) of the Act, 21 U.S.C. section  360bbb-3(b)(1), unless the authorization is terminated or revoked sooner.  Performed at [facility], [place] [address], [place], [place] [address]   Surgical pcr screen     Status: None   Collection Time: 07/27/19  1:15 AM   Specimen: Nasal Mucosa; Nasal Swab  Result Value Ref Range Status   MRSA, PCR NEGATIVE NEGATIVE Final   Staphylococcus aureus NEGATIVE NEGATIVE Final    Comment: (NOTE) The Xpert SA Assay (FDA approved for NASAL specimens in patients 19 years of age and older), is one component of a comprehensive surveillance program. It is not intended to diagnose infection nor to guide or monitor treatment. Performed at [facility], [place] [address] [place], [place] [address]          Radiology Studies: DG Ribs Unilateral W/Chest Left  Result Date: 07/26/2019 CLINICAL DATA:  Fall, bilateral ankle fracture, chest pain EXAM: LEFT RIBS AND CHEST - 3+ VIEW COMPARISON:  None. FINDINGS: No fracture or other bone lesions are seen involving the ribs. There is no evidence of pneumothorax or pleural effusion. Both lungs are clear. Heart size and mediastinal contours are within normal limits. Bilateral elbow arthroplasty is incidentally noted. IMPRESSION: Negative. Electronically Signed   By: Fidela Salisbury MD   On: 07/26/2019 17:21   DG Pelvis 1-2 Views  Result Date: 07/26/2019 CLINICAL DATA:  Fall, bilateral ankle fracture EXAM: PELVIS - 1-2 VIEW COMPARISON:  03/11/2011 FINDINGS: The osseous structures are mildly osteopenic. Surgical changes of left total hip arthroplasty are identified. Arthroplasty components overlie the expected position. No fracture or dislocation identified. Limited evaluation of the right hip is unremarkable. Heterotopic ossification surrounds the left hip. Vascular calcifications are seen within the pelvis. IMPRESSION: No acute fracture or dislocation. Electronically Signed   By: Fidela Salisbury MD   On: 07/26/2019 17:20   DG Elbow 2 Views  Right  Result Date: 07/26/2019 CLINICAL DATA:  Pain status post fall EXAM: RIGHT ELBOW - 2 VIEW COMPARISON:  None. FINDINGS: The patient has undergone prior elbow prosthesis placement. No prior studies available for comparison. The hardware appears grossly intact. There appears to be extensive heterotopic ossification about the elbow. There is surrounding soft tissue swelling. There is likely a joint effusion with intra-articular debris. There is no evidence for a periprosthetic fracture. IMPRESSION: 1. Evaluation is limited by lack of a prior comparison. 2. There is a right elbow prosthesis that appears grossly intact where visualized. 3. No definite evidence for an acute displaced fracture. 4. There is extensive surrounding heterotopic ossification with a probable large joint effusion with intra-articular debris. Comparison to an outside prior study would be useful. Electronically Signed   By: Harrell Gave  Green M.D.   On: 07/26/2019 19:55   DG Ankle 2 Views Right  Result Date: 07/26/2019 CLINICAL DATA:  Post reduction of the right ankle EXAM: RIGHT ANKLE - 2 VIEW COMPARISON:  Radiograph 07/26/2019 FINDINGS: Post closed reduction and splinting of the previously seen trimalleolar fracture of the right ankle including an oblique trans syndesmotic distal fibular fracture, transversely oriented medial malleolar fracture and posterior malleolar fracture which are in grossly improved alignment from comparison. Some persistent asymmetric widening of the medial clear space suggestive of some residual talar shift in the setting of this unstable ankle injury. Extensive circumferential soft tissue swelling of the ankle and ankle joint effusion are better visualized on the un splinted images. Severe talonavicular arthrosis is similar to prior. Bidirectional calcaneal spurs are again seen. Midfoot and hindfoot alignment is grossly preserved though incompletely assessed on nondedicated, nonweightbearing films. IMPRESSION:  1. Post closed reduction and splinting of trimalleolar fracture of the right ankle, in grossly improved alignment from comparison albeit with some residual mild fracture displacement and lateral talar shift. 2. Additional swelling and ankle effusion, better seen on prior. 3. Fracture pattern compatible with a Weber B stage IV unstable ankle injury. Electronically Signed   By: Lovena Le M.D.   On: 07/26/2019 18:39   DG Ankle Complete Left  Result Date: 07/27/2019 CLINICAL DATA:  ORIF BILATERAL ankle fractures EXAM: LEFT ANKLE COMPLETE - 3+ VIEW COMPARISON:  07/26/2019 FLUOROSCOPY TIME:  0 minutes 35 seconds Dose: 0.94 mGy Images: 3 FINDINGS: Osseous demineralization. Two cannulated screws placed across a reduced medial malleolar fracture. Lateral plate and screws placed across a reduced oblique distal LEFT fibular fracture. Two K-wires been placed across the calcaneus, talus into distal tibia. Minimal narrowing of ankle joint. IMPRESSION: Post bimalleolar fracture LEFT ankle. Electronically Signed   By: Lavonia Dana M.D.   On: 07/27/2019 14:17   DG Ankle Complete Left  Result Date: 07/26/2019 CLINICAL DATA:  Fall, left ankle tenderness EXAM: LEFT ANKLE COMPLETE - 3+ VIEW COMPARISON:  None. FINDINGS: Three view radiograph of left ankle excludes the a posterior calcaneus on lateral examination. Bimalleolar fracture of the left ankle is identified. Complex fracture plane involving the medial malleolus is identified with a transverse fracture plane noted posteriorly, with a more oblique, vertically oriented component anteriorly with resultant mild displacement and minimal medial angulation of the medial malleolar fracture fragment. Oblique fracture of the distal fibular metaphysis is also identified with the fracture plane extending to the tibial plafond with minimal lateral angulation of the distal fracture fragment. Extensive soft tissue swelling superficial to the a lateral malleolus. No other fracture  identified. IMPRESSION: Left ankle bimalleolar fracture with complex fracture plane demonstrating both transverse and vertically oriented oblique fracture planes involving the medial malleolus. Electronically Signed   By: Fidela Salisbury MD   On: 07/26/2019 17:18   DG Ankle Complete Right  Result Date: 07/27/2019 CLINICAL DATA:  ORIF BILATERAL ankle fractures EXAM: DG C-ARM 1-60 MIN; RIGHT ANKLE - COMPLETE 3+ VIEW FLUOROSCOPY TIME:  Fluoroscopy Time:  1 minutes 4 seconds Radiation Exposure Index (if provided by the fluoroscopic device): 1.83 mGy Number of Acquired Spot Images: 3 COMPARISON:  01/26/2019 FINDINGS: Two cannulated screws placed across reduced medial malleolar fracture. Lateral plate and multiple screws placed across a reduced distal RIGHT fibular fracture. Ankle joint alignment normal. Two K-wires traverse the calcaneus, talus and distal tibia. Diffuse osseous demineralization with minimal narrowing of ankle joint. IMPRESSION: Post bimalleolar ORIF RIGHT ankle as above. Electronically Signed  By: Lavonia Dana M.D.   On: 07/27/2019 14:16   DG Ankle Complete Right  Result Date: 07/26/2019 CLINICAL DATA:  Fall, right ankle tenderness EXAM: RIGHT ANKLE - COMPLETE 3+ VIEW COMPARISON:  None. FINDINGS: Three view radiograph of the right ankle excludes the posterior aspect of the right heel on lateral examination. These images demonstrate a trimalleolar fracture of the right ankle. Oblique fracture of the a distal right fibular metaphysis at the level of the tibial plafond with 1/2 shaft with lateral displacement and approximately 7-8 mm override and approximately 25 mm posterolateral angulation of the distal fracture fragment. Transverse fracture of the medial malleolus with minimal lateral angulation approximately 1 cm lateral displacement of the distal fracture fragment Minimally displaced fracture of the posterior malleolus with minimal posterolateral displacement of the fracture fragment. Resultant  1 cm lateral subluxation of the talar dome in relation to the tibial plafond. Superimposed severe talonavicular degenerative arthritis. Extensive bimalleolar soft tissue swelling. IMPRESSION: Trimalleolar fracture subluxation of the right ankle as described above. Electronically Signed   By: Fidela Salisbury MD   On: 07/26/2019 17:14   CT Head Wo Contrast  Result Date: 07/26/2019 CLINICAL DATA:  Head trauma, head injury following fall EXAM: CT HEAD WITHOUT CONTRAST TECHNIQUE: Contiguous axial images were obtained from the base of the skull through the vertex without intravenous contrast. COMPARISON:  09/23/2018 FINDINGS: Brain: Normal anatomic configuration. Moderate periventricular white matter changes are present likely reflecting the sequela of small vessel ischemia. Remote lacunar infarct within the anterior limb of the right internal capsule is again identified. No abnormal intra or extra-axial mass lesion or fluid collection. No abnormal mass effect or midline shift. No evidence of acute intracranial hemorrhage or infarct. Ventricular size is normal. Cerebellum unremarkable. Vascular: Extensive vascular calcifications are again seen within the distal left vertebral artery and proximal basilar artery. No hyperdense vasculature at the skull base. Skull: Intact Sinuses/Orbits: Paranasal sinuses are clear. Orbits are unremarkable. Other: Mastoid air cells and middle ear cavities are clear. IMPRESSION: No acute intracranial injury.  No calvarial fracture. Electronically Signed   By: Fidela Salisbury MD   On: 07/26/2019 17:09   DG Ankle Left Port  Result Date: 07/27/2019 CLINICAL DATA:  Post bimalleolar ORIF LEFT ankle EXAM: PORTABLE LEFT ANKLE - 2 VIEW COMPARISON:  Portable exam 1315 hours compared to earlier intraoperative images FINDINGS: Plaster cast material obscures bone detail. K-wires from plantar broach traverse the calcaneus, talus and into the distal tibia. Lateral plate and multiple screws at distal  fibula post ORIF. Two cannulated screws at medial malleolus post ORIF. No additional fracture dislocation. IMPRESSION: Post bimalleolar ORIF LEFT ankle. Electronically Signed   By: Lavonia Dana M.D.   On: 07/27/2019 14:19   DG Ankle Right Port  Result Date: 07/27/2019 CLINICAL DATA:  Post ORIF RIGHT ankle EXAM: PORTABLE RIGHT ANKLE - 2 VIEW COMPARISON:  Portable exam 1319 hours compared to earlier intraoperative images FINDINGS: Plaster cast material obscures bone detail. Diffuse osseous demineralization. Two cannulated screws at medial malleolus post ORIF. Lateral plate and screws at distal fibula post ORIF. Two K-wires from plantar approach traverse the calcaneus, talus and into distal tibia. No additional fracture or dislocation seen. Advanced degenerative changes of the talonavicular joint. IMPRESSION: Post bimalleolar ORIF as above. Electronically Signed   By: Lavonia Dana M.D.   On: 07/27/2019 14:22   DG C-Arm 1-60 Min  Result Date: 07/27/2019 CLINICAL DATA:  ORIF BILATERAL ankle fractures EXAM: DG C-ARM 1-60 MIN; RIGHT ANKLE -  COMPLETE 3+ VIEW FLUOROSCOPY TIME:  Fluoroscopy Time:  1 minutes 4 seconds Radiation Exposure Index (if provided by the fluoroscopic device): 1.83 mGy Number of Acquired Spot Images: 3 COMPARISON:  01/26/2019 FINDINGS: Two cannulated screws placed across reduced medial malleolar fracture. Lateral plate and multiple screws placed across a reduced distal RIGHT fibular fracture. Ankle joint alignment normal. Two K-wires traverse the calcaneus, talus and distal tibia. Diffuse osseous demineralization with minimal narrowing of ankle joint. IMPRESSION: Post bimalleolar ORIF RIGHT ankle as above. Electronically Signed   By: Lavonia Dana M.D.   On: 07/27/2019 14:16        Scheduled Meds: . acetaminophen  1,000 mg Oral Q8H  . amLODipine  5 mg Oral q morning - 10a  . benazepril  20 mg Oral q morning - 10a  . docusate sodium  100 mg Oral BID  . [START ON 07/28/2019] enoxaparin  (LOVENOX) injection  40 mg Subcutaneous Q24H  . levothyroxine  100 mcg Oral QAC breakfast  . pantoprazole  40 mg Oral Daily  . predniSONE  5 mg Oral Daily   Continuous Infusions: . ceFAZolin    .  ceFAZolin (ANCEF) IV 1 g (07/27/19 1420)  . dextrose 5 % and 0.9 % NaCl with KCl 20 mEq/L 75 mL/hr at 07/26/19 2036  . methocarbamol (ROBAXIN) IV       LOS: 1 day    Jocelyn Shell, MD  07/27/2019, 2:30 PM

## 2019-07-27 NOTE — Consult Note (Addendum)
Orthopaedic Trauma Service Consultation  Reason for Consult: Bilateral unstable ankle fractures Referring Physician: Varney Biles, MD  Jocelyn Bauer is an 86 y.o. female.  HPI: Patient with multiple medical problems, including prolonged high dose narcotic use, fell on return from the mailbox and developed severe pain of both ankle with deformity and inability to bear weight. Denies numbness or tingling. She denies other injury. She is s/p revision total elbow by Dr. Amedeo Plenty as well as bilateral THA by Dr. Maureen Ralphs. Lives with her daughter.  Past Medical History:  Diagnosis Date  . Anxiety    HX OF PANIC ATTACKS  . Arthritis    RA  . Chronic neck and back pain   . Chronic pain   . Elbow locking    due to RA- elbow with limited extension  . GERD (gastroesophageal reflux disease)   . Headache   . Hyperlipidemia   . Hypertension   . Hypothyroidism   . Osteoarthritis   . Osteoporosis   . Shortness of breath    occas  . UTI (urinary tract infection)     Past Surgical History:  Procedure Laterality Date  . CATARACT EXTRACTION Bilateral   . COLONOSCOPY    . ELBOW SURGERY Right   . ELBOW SURGERY Left   . ESOPHAGOGASTRODUODENOSCOPY    . HARDWARE REMOVAL Right 07/13/2015   Procedure:  WITH HARDWARE REMOVAL REPAIR AS NECESSARY;  Surgeon: Roseanne Kaufman, MD;  Location: [facility];  Service: Orthopedics;  Laterality: Right;  . JOINT REPLACEMENT  1998   left hip replacement and bilateral elbow replacements  about 15 yrs ago  . thyroidectomy    . TOTAL ELBOW ARTHROPLASTY Right 07/13/2015   Procedure: RIGHT ELBOW REVISION TOTAL ELBOW ARTHROPLASTY ;  Surgeon: Roseanne Kaufman, MD;  Location: [facility];  Service: Orthopedics;  Laterality: Right;  . TOTAL HIP ARTHROPLASTY    . TOTAL HIP REVISION  03/11/2011   Procedure: TOTAL HIP REVISION;  Surgeon: [name], MD;  Location: [facility];  Service: Orthopedics;  Laterality: Left;  . TRICEPS TENDON REPAIR Right 07/13/2015   Procedure:  TRICEP RECONSTRUCTION  REPAIR WITH ALLOGRAFT, ULNAR NERVE NEUROLYSIS;  Surgeon: Roseanne Kaufman, MD;  Location: [facility];  Service: Orthopedics;  Laterality: Right;    Family History  Problem Relation Age of Onset  . Heart attack Mother   . Prostate cancer Father   . Lung disease Brother   . Heart attack Sister   . COPD Sister   . Cancer Sister        She does not know which type of cancer    Social History:  reports that she has quit smoking. She has never used smokeless tobacco. She reports that she does not drink alcohol and does not use drugs.  Allergies:  Allergies  Allergen Reactions  . Contrast Media [Iodinated Diagnostic Agents] Other (See Comments)    Itching.( 13 hr pre medication regimen worked fine. )  . Tdap [Tetanus-Diphth-Acell Pertussis] Hives  . Iodine   . Streptococcus (Diplococcus) Pneumoniae [Streptococci] Other (See Comments)    Shortness of breath    Medications:  Prior to Admission:  Medications Prior to Admission  Medication Sig Dispense Refill Last Dose  . amLODipine (NORVASC) 5 MG tablet Take 5 mg by mouth every morning.   07/24/2019  . benazepril (LOTENSIN) 20 MG tablet Take 20 mg by mouth every morning.   07/26/2019 at Unknown time  . cholecalciferol (VITAMIN D) 1000 UNITS tablet Take 2,000 Units by mouth daily.  07/25/2019 at Unknown time  . denosumab (PROLIA) 60 MG/ML SOLN injection Inject 60 mg into the skin every 6 (six) months. Administer in upper arm, thigh, or abdomen   01/26/2019  . fentaNYL (DURAGESIC - DOSED MCG/HR) 50 MCG/HR Place 50 mcg onto the skin every 3 (three) days.   07/25/2019 at Unknown time  . fexofenadine (ALLEGRA) 180 MG tablet Take 180 mg by mouth daily as needed for allergies.    8/33/8250  . folic acid (FOLVITE) 1 MG tablet Take 1 mg by mouth daily.   07/25/2019 at Unknown time  . furosemide (LASIX) 20 MG tablet Take 20 mg by mouth See admin instructions. Takes twice a week.   07/23/2019  . levothyroxine (SYNTHROID, LEVOTHROID) 100 MCG tablet Take 100 mcg  by mouth daily before breakfast.   07/26/2019 at Unknown time  . LORazepam (ATIVAN) 0.5 MG tablet Take 0.25 mg by mouth 2 (two) times daily as needed for anxiety.    07/26/2019 at Unknown time  . methotrexate (RHEUMATREX) 10 MG tablet Take 10 mg by mouth once a week.    07/19/2019  . omeprazole (PRILOSEC) 20 MG capsule Take 20 mg by mouth daily.    07/26/2019 at Unknown time  . oxyCODONE-acetaminophen (PERCOCET) 10-325 MG tablet Take 1 tablet by mouth every 8 (eight) hours as needed for pain. Pain   07/26/2019 at Unknown time  . Omega-3 Fatty Acids (FISH OIL) 1000 MG CAPS Take 1 capsule by mouth daily. (Patient not taking: Reported on 07/26/2019)   Not Taking at Unknown time  . predniSONE (DELTASONE) 5 MG tablet Take 1 tablet (5 mg total) by mouth daily. (Patient not taking: Reported on 07/26/2019) 15 tablet 0 Not Taking at Unknown time    Results for orders placed or performed during the hospital encounter of 07/26/19 (from the past 48 hour(s))  Basic metabolic panel     Status: Abnormal   Collection Time: 07/26/19  3:31 PM  Result Value Ref Range   Sodium 140 135 - 145 mmol/L   Potassium 3.1 (L) 3.5 - 5.1 mmol/L   Chloride 100 98 - 111 mmol/L   CO2 28 22 - 32 mmol/L   Glucose, Bld 115 (H) 70 - 99 mg/dL    Comment: Glucose reference range applies only to samples taken after fasting for at least 8 hours.   BUN 13 8 - 23 mg/dL   Creatinine, Ser 0.79 0.44 - 1.00 mg/dL   Calcium 9.1 8.9 - 10.3 mg/dL   GFR calc non Af Amer >60 >60 mL/min   GFR calc Af Amer >60 >60 mL/min   Anion gap 12 5 - 15    Comment: Performed at [facility], [place] [address], [place], [place] [address]  CBC with Differential     Status: Abnormal   Collection Time: 07/26/19  3:31 PM  Result Value Ref Range   WBC 10.6 (H) 4.0 - 10.5 K/uL   RBC 4.10 3.87 - 5.11 MIL/uL   Hemoglobin 11.5 (L) 12.0 - 15.0 g/dL   HCT 36.8 36 - 46 %   MCV 89.8 80.0 - 100.0 fL   MCH 28.0 26.0 - 34.0 pg   MCHC 31.3 30.0 - 36.0  g/dL   RDW 18.4 (H) 11.5 - 15.5 %   Platelets 396 150 - 400 K/uL   nRBC 0.0 0.0 - 0.2 %   Neutrophils Relative % 84 %   Neutro Abs 8.9 (H) 1.7 - 7.7 K/uL   Lymphocytes Relative 9 %  Lymphs Abs 0.9 0.7 - 4.0 K/uL   Monocytes Relative 6 %   Monocytes Absolute 0.7 0 - 1 K/uL   Eosinophils Relative 0 %   Eosinophils Absolute 0.0 0 - 0 K/uL   Basophils Relative 1 %   Basophils Absolute 0.1 0 - 0 K/uL   Immature Granulocytes 0 %   Abs Immature Granulocytes 0.04 0.00 - 0.07 K/uL    Comment: Performed at [facility], [place] [address] [place], [place] [address]  SARS Coronavirus 2 by RT PCR (hospital order, performed in [facility] hospital lab) Nasopharyngeal Nasopharyngeal Swab     Status: None   Collection Time: 07/26/19  5:10 PM   Specimen: Nasopharyngeal Swab  Result Value Ref Range   SARS Coronavirus 2 NEGATIVE NEGATIVE    Comment: (NOTE) SARS-CoV-2 target nucleic acids are NOT DETECTED.  The SARS-CoV-2 RNA is generally detectable in upper and lower respiratory specimens during the acute phase of infection. The lowest concentration of SARS-CoV-2 viral copies this assay can detect is 250 copies / mL. A negative result does not preclude SARS-CoV-2 infection and should not be used as the sole basis for treatment or other patient management decisions.  A negative result may occur with improper specimen collection / handling, submission of specimen other than nasopharyngeal swab, presence of viral mutation(s) within the areas targeted by this assay, and inadequate number of viral copies (<250 copies / mL). A negative result must be combined with clinical observations, patient history, and epidemiological information.  Fact Sheet for Patients:   StrictlyIdeas.no  Fact Sheet for Healthcare Providers: BankingDealers.co.za  This test is not yet approved or  cleared by the [place] FDA and has been authorized for  detection and/or diagnosis of SARS-CoV-2 by FDA under an Emergency Use Authorization (EUA).  This EUA will remain in effect (meaning this test can be used) for the duration of the COVID-19 declaration under Section 564(b)(1) of the Act, 21 U.S.C. section 360bbb-3(b)(1), unless the authorization is terminated or revoked sooner.  Performed at [facility], [place] [address] [place], [place] [address]   Surgical pcr screen     Status: None   Collection Time: 07/27/19  1:15 AM   Specimen: Nasal Mucosa; Nasal Swab  Result Value Ref Range   MRSA, PCR NEGATIVE NEGATIVE   Staphylococcus aureus NEGATIVE NEGATIVE    Comment: (NOTE) The Xpert SA Assay (FDA approved for NASAL specimens in patients 27 years of age and older), is one component of a comprehensive surveillance program. It is not intended to diagnose infection nor to guide or monitor treatment. Performed at [facility], [place] [address], [place], [place] [address]     DG Ribs Unilateral W/Chest Left  Result Date: 07/26/2019 CLINICAL DATA:  Fall, bilateral ankle fracture, chest pain EXAM: LEFT RIBS AND CHEST - 3+ VIEW COMPARISON:  None. FINDINGS: No fracture or other bone lesions are seen involving the ribs. There is no evidence of pneumothorax or pleural effusion. Both lungs are clear. Heart size and mediastinal contours are within normal limits. Bilateral elbow arthroplasty is incidentally noted. IMPRESSION: Negative. Electronically Signed   By: Fidela Salisbury MD   On: 07/26/2019 17:21   DG Pelvis 1-2 Views  Result Date: 07/26/2019 CLINICAL DATA:  Fall, bilateral ankle fracture EXAM: PELVIS - 1-2 VIEW COMPARISON:  03/11/2011 FINDINGS: The osseous structures are mildly osteopenic. Surgical changes of left total hip arthroplasty are identified. Arthroplasty components overlie the expected position. No fracture or dislocation identified.  Limited evaluation of the right hip is unremarkable. Heterotopic ossification  surrounds the left hip. Vascular calcifications are seen within the pelvis. IMPRESSION: No acute fracture or dislocation. Electronically Signed   By: Fidela Salisbury MD   On: 07/26/2019 17:20   DG Elbow 2 Views Right  Result Date: 07/26/2019 CLINICAL DATA:  Pain status post fall EXAM: RIGHT ELBOW - 2 VIEW COMPARISON:  None. FINDINGS: The patient has undergone prior elbow prosthesis placement. No prior studies available for comparison. The hardware appears grossly intact. There appears to be extensive heterotopic ossification about the elbow. There is surrounding soft tissue swelling. There is likely a joint effusion with intra-articular debris. There is no evidence for a periprosthetic fracture. IMPRESSION: 1. Evaluation is limited by lack of a prior comparison. 2. There is a right elbow prosthesis that appears grossly intact where visualized. 3. No definite evidence for an acute displaced fracture. 4. There is extensive surrounding heterotopic ossification with a probable large joint effusion with intra-articular debris. Comparison to an outside prior study would be useful. Electronically Signed   By: Constance Holster M.D.   On: 07/26/2019 19:55   DG Ankle 2 Views Right  Result Date: 07/26/2019 CLINICAL DATA:  Post reduction of the right ankle EXAM: RIGHT ANKLE - 2 VIEW COMPARISON:  Radiograph 07/26/2019 FINDINGS: Post closed reduction and splinting of the previously seen trimalleolar fracture of the right ankle including an oblique trans syndesmotic distal fibular fracture, transversely oriented medial malleolar fracture and posterior malleolar fracture which are in grossly improved alignment from comparison. Some persistent asymmetric widening of the medial clear space suggestive of some residual talar shift in the setting of this unstable ankle injury. Extensive circumferential soft tissue swelling of the ankle and ankle joint effusion are better visualized on the un splinted images. Severe  talonavicular arthrosis is similar to prior. Bidirectional calcaneal spurs are again seen. Midfoot and hindfoot alignment is grossly preserved though incompletely assessed on nondedicated, nonweightbearing films. IMPRESSION: 1. Post closed reduction and splinting of trimalleolar fracture of the right ankle, in grossly improved alignment from comparison albeit with some residual mild fracture displacement and lateral talar shift. 2. Additional swelling and ankle effusion, better seen on prior. 3. Fracture pattern compatible with a Weber B stage IV unstable ankle injury. Electronically Signed   By: Lovena Le M.D.   On: 07/26/2019 18:39   DG Ankle Complete Left  Result Date: 07/26/2019 CLINICAL DATA:  Fall, left ankle tenderness EXAM: LEFT ANKLE COMPLETE - 3+ VIEW COMPARISON:  None. FINDINGS: Three view radiograph of left ankle excludes the a posterior calcaneus on lateral examination. Bimalleolar fracture of the left ankle is identified. Complex fracture plane involving the medial malleolus is identified with a transverse fracture plane noted posteriorly, with a more oblique, vertically oriented component anteriorly with resultant mild displacement and minimal medial angulation of the medial malleolar fracture fragment. Oblique fracture of the distal fibular metaphysis is also identified with the fracture plane extending to the tibial plafond with minimal lateral angulation of the distal fracture fragment. Extensive soft tissue swelling superficial to the a lateral malleolus. No other fracture identified. IMPRESSION: Left ankle bimalleolar fracture with complex fracture plane demonstrating both transverse and vertically oriented oblique fracture planes involving the medial malleolus. Electronically Signed   By: Fidela Salisbury MD   On: 07/26/2019 17:18   DG Ankle Complete Right  Result Date: 07/26/2019 CLINICAL DATA:  Fall, right ankle tenderness EXAM: RIGHT ANKLE - COMPLETE 3+ VIEW COMPARISON:  None.  FINDINGS: Three  view radiograph of the right ankle excludes the posterior aspect of the right heel on lateral examination. These images demonstrate a trimalleolar fracture of the right ankle. Oblique fracture of the a distal right fibular metaphysis at the level of the tibial plafond with 1/2 shaft with lateral displacement and approximately 7-8 mm override and approximately 25 mm posterolateral angulation of the distal fracture fragment. Transverse fracture of the medial malleolus with minimal lateral angulation approximately 1 cm lateral displacement of the distal fracture fragment Minimally displaced fracture of the posterior malleolus with minimal posterolateral displacement of the fracture fragment. Resultant 1 cm lateral subluxation of the talar dome in relation to the tibial plafond. Superimposed severe talonavicular degenerative arthritis. Extensive bimalleolar soft tissue swelling. IMPRESSION: Trimalleolar fracture subluxation of the right ankle as described above. Electronically Signed   By: Fidela Salisbury MD   On: 07/26/2019 17:14   CT Head Wo Contrast  Result Date: 07/26/2019 CLINICAL DATA:  Head trauma, head injury following fall EXAM: CT HEAD WITHOUT CONTRAST TECHNIQUE: Contiguous axial images were obtained from the base of the skull through the vertex without intravenous contrast. COMPARISON:  09/23/2018 FINDINGS: Brain: Normal anatomic configuration. Moderate periventricular white matter changes are present likely reflecting the sequela of small vessel ischemia. Remote lacunar infarct within the anterior limb of the right internal capsule is again identified. No abnormal intra or extra-axial mass lesion or fluid collection. No abnormal mass effect or midline shift. No evidence of acute intracranial hemorrhage or infarct. Ventricular size is normal. Cerebellum unremarkable. Vascular: Extensive vascular calcifications are again seen within the distal left vertebral artery and proximal basilar  artery. No hyperdense vasculature at the skull base. Skull: Intact Sinuses/Orbits: Paranasal sinuses are clear. Orbits are unremarkable. Other: Mastoid air cells and middle ear cavities are clear. IMPRESSION: No acute intracranial injury.  No calvarial fracture. Electronically Signed   By: Fidela Salisbury MD   On: 07/26/2019 17:09    ROS No recent fever, urologic dysfunction, GI problems, or weight gain. Does report easy bleeding and bruising.  Blood pressure (!) 148/69, pulse 73, temperature 98.5 F (36.9 C), temperature source Oral, resp. rate 18, height 5\' 4"  (1.626 m), weight 75.2 kg, SpO2 96 %. Physical Exam NCAT No wheezing or retractions with breathing RRR LLE Splint intact  Edema/ swelling controlled  Sens: DPN, SPN, TN intact  Motor: EHL, FHL, and lessor toe ext and flex all intact grossly  Brisk cap refill, warm to touch RLE Splint intact  Edema/ swelling controlled  Sens: DPN, SPN, TN intact  Motor: EHL, FHL, and lessor toe ext and flex all intact grossly  Brisk cap refill, warm to touch  Assessment/Plan: Bilateral ankle fractures, unstable Chronic Pain Multiple medical problems  I discussed with the patient the risks and benefits of surgery, including the possibility of infection, nerve injury, vessel injury, wound breakdown, arthritis, symptomatic hardware, DVT/ PE, loss of motion, malunion, nonunion, and need for further surgery among others.  We also specifically discussed the elevated risks of complications related to prolonged NWB and her soft bone which could result in noncompliance, loss of fixation, and thromboembolic disease. She acknowledged these risks and wished to proceed.  Altamese Aberdeen, MD [facility], [facility] [phone]  07/27/2019  8:40 AM

## 2019-07-27 NOTE — Evaluation (Signed)
Physical Therapy Evaluation Patient Details Name: Jocelyn Bauer MRN: [id number] DOB: [birth year] Today's Date: 07/27/2019   History of Present Illness  86yo female who fell at home and sustained L ankle bimalleolar fracture with complex fracture plane of medial malleolus, also trimalleolar fracture/subluxation of R ankle. CTH negative for actue injury. Received B ankle ORIF 07/27/19. PMH osteoporosis, OA, hypothyroidism, HTN, HLD, chronic pain, R triceps tendon repair, B total elbow arthroplasties, B THAs  Clinical Impression   Patient received in bed, very pleasant but very confused this afternoon and perseverating on telling me her birthday. Needed MaxA of 1-2 people depending on task to get to EOB however with very poor adherence to NWB precautions on BLEs- tries to use BLEs to help scoot and maneuver in the bed. Fatigued easily at EOB and generally needed MinA to maintain upright and for safety. VSS on RA. Left in bed with all needs met, positioned to comfort and bed alarm active. Definitely in need of skilled PT services in post-acute rehab setting prior to return home.     Follow Up Recommendations SNF;Supervision/Assistance - 24 hour    Equipment Recommendations  Wheelchair (measurements PT);Wheelchair cushion (measurements PT)    Recommendations for Other Services       Precautions / Restrictions Precautions Precautions: Fall;Other (comment) Precaution Comments: limited ROM L elbow, NWB BLEs Restrictions Weight Bearing Restrictions: Yes RLE Weight Bearing: Non weight bearing LLE Weight Bearing: Non weight bearing      Mobility  Bed Mobility Overal bed mobility: Needs Assistance Bed Mobility: Rolling;Supine to Sit;Sit to Supine Rolling: Max assist   Supine to sit: Max assist;+2 for physical assistance Sit to supine: Max assist;+2 for physical assistance   General bed mobility comments: MaxA for rolling and maintaining NWB BLEs; needed assist of 2 helpers to perform bed  mobility safely while maintaining integrity of lines and WB precautions  Transfers                 General transfer comment: deferred- safety  Ambulation/Gait             General Gait Details: deferred- safety  Stairs            Wheelchair Mobility    Modified Rankin (Stroke Patients Only)       Balance Overall balance assessment: Needs assistance Sitting-balance support: Single extremity supported;Feet unsupported Sitting balance-Leahy Scale: Poor Sitting balance - Comments: MinA to maintain upright sitting at EOB Postural control: Posterior lean                                   Pertinent Vitals/Pain Pain Assessment: Faces Faces Pain Scale: Hurts little more Pain Location: B ankles/LEs Pain Descriptors / Indicators: Discomfort;Aching;Sore Pain Intervention(s): Limited activity within patient's tolerance;Monitored during session;Premedicated before session    Home Living Family/patient expects to be discharged to:: Private residence Living Arrangements: Children (does have to be alone during the day) Available Help at Discharge: Family;Available PRN/intermittently Type of Home: House Home Access: Stairs to enter Entrance Stairs-Rails: Can reach both Entrance Stairs-Number of Steps: 4 Home Layout: One level Home Equipment: Walker - 2 wheels;Walker - standard;Walker - 4 wheels;Bedside commode;Cane - single point;Shower seat Additional Comments: has equipment but doesn't really use it or use it correctly- family reports she was using cane when she fell    Prior Function Level of Independence: Independent         Comments: family suspects she  has had multiple falls at home she doesn't tell anyone about- family has had to pick her up finding her at home in the past but without significant injury during those events     Hand Dominance   Dominant Hand: Right    Extremity/Trunk Assessment   Upper Extremity Assessment Upper  Extremity Assessment: Defer to OT evaluation    Lower Extremity Assessment Lower Extremity Assessment: Generalized weakness    Cervical / Trunk Assessment Cervical / Trunk Assessment: Kyphotic  Communication   Communication: No difficulties  Cognition Arousal/Alertness: Awake/alert Behavior During Therapy: Flat affect Overall Cognitive Status: Impaired/Different from baseline Area of Impairment: Orientation;Attention;Memory;Following commands;Safety/judgement;Awareness;Problem solving                 Orientation Level: Disoriented to;Situation;Time Current Attention Level: Sustained Memory: Decreased recall of precautions;Decreased short-term memory Following Commands: Follows one step commands inconsistently;Follows one step commands with increased time Safety/Judgement: Decreased awareness of safety;Decreased awareness of deficits Awareness: Intellectual Problem Solving: Slow processing;Decreased initiation;Difficulty sequencing;Requires verbal cues;Requires tactile cues General Comments: slow processing and perseverated on telling me her birth year; very poor awarness of safety and deficits, very poor ability to maintain WB precautions consistently even rolling in bed      General Comments General comments (skin integrity, edema, etc.): VSS on RA    Exercises     Assessment/Plan    PT Assessment Patient needs continued PT services  PT Problem List Decreased strength;Decreased cognition;Decreased range of motion;Decreased knowledge of use of DME;Decreased activity tolerance;Decreased safety awareness;Decreased balance;Decreased knowledge of precautions;Pain;Decreased mobility;Decreased coordination       PT Treatment Interventions DME instruction;Balance training;Gait training;Cognitive remediation;Functional mobility training;Patient/family education;Therapeutic activities;Therapeutic exercise;Wheelchair mobility training    PT Goals (Current goals can be found in  the Care Plan section)  Acute Rehab PT Goals Patient Stated Goal: go to rehab prior to return home PT Goal Formulation: With family Time For Goal Achievement: 08/10/19 Potential to Achieve Goals: Good    Frequency Min 2X/week   Barriers to discharge        Co-evaluation               AM-PAC PT "6 Clicks" Mobility  Outcome Measure Help needed turning from your back to your side while in a flat bed without using bedrails?: A Lot Help needed moving from lying on your back to sitting on the side of a flat bed without using bedrails?: A Lot Help needed moving to and from a bed to a chair (including a wheelchair)?: Total Help needed standing up from a chair using your arms (e.g., wheelchair or bedside chair)?: Total Help needed to walk in hospital room?: Total Help needed climbing 3-5 steps with a railing? : Total 6 Click Score: 8    End of Session   Activity Tolerance: Patient tolerated treatment well Patient left: in bed;with call bell/phone within reach;with bed alarm set;with family/visitor present   PT Visit Diagnosis: Other abnormalities of gait and mobility (R26.89);Repeated falls (R29.6);Pain;Muscle weakness (generalized) (M62.81) Pain - Right/Left:  (bilateral) Pain - part of body: Ankle and joints of foot    Time: 1405-1440 PT Time Calculation (min) (ACUTE ONLY): 35 min   Charges:   PT Evaluation $PT Eval Moderate Complexity: 1 Mod PT Treatments $Therapeutic Activity: 8-22 mins        Windell Norfolk, DPT, PN1   Supplemental Physical Therapist [place]    Pager [phone] Acute Rehab Office [phone]

## 2019-07-27 NOTE — Anesthesia Procedure Notes (Signed)
Anesthesia Regional Block: Popliteal block   Pre-Anesthetic Checklist: ,, timeout performed, Correct Patient, Correct Site, Correct Laterality, Correct Procedure, Correct Position, site marked, Risks and benefits discussed, pre-op evaluation,  At surgeon's request and post-op pain management  Laterality: Right  Prep: Maximum Sterile Barrier Precautions used, chloraprep       Needles:  Injection technique: Single-shot  Needle Type: Echogenic Stimulator Needle     Needle Length: 9cm  Needle Gauge: 22     Additional Needles:   Procedures:,,,, ultrasound used (permanent image in chart),,,,  Narrative:  Start time: 07/27/2019 8:32 AM End time: 07/27/2019 8:34 AM Injection made incrementally with aspirations every 5 mL.  Performed by: Personally  Anesthesiologist: Brennan Bailey, MD  Additional Notes: Risks, benefits, and alternative discussed. Patient gave consent for procedure. Patient prepped and draped in sterile fashion. Sedation administered, patient remains easily responsive to voice. Relevant anatomy identified with ultrasound guidance. Local anesthetic given in 5cc increments with no signs or symptoms of intravascular injection. No pain or paraesthesias with injection. Patient monitored throughout procedure with signs of LAST or immediate complications. Tolerated well. Ultrasound image placed in chart.  Tawny Asal, MD

## 2019-07-27 NOTE — Anesthesia Postprocedure Evaluation (Signed)
Anesthesia Post Note  Patient: [facility] ODDO  Procedure(s) Performed: OPEN REDUCTION INTERNAL FIXATION (ORIF) ANKLE FRACTURE (Bilateral Ankle)     Patient location during evaluation: PACU Anesthesia Type: General Level of consciousness: awake and alert and oriented Pain management: pain level controlled Vital Signs Assessment: post-procedure vital signs reviewed and stable Respiratory status: spontaneous breathing, nonlabored ventilation and respiratory function stable Cardiovascular status: blood pressure returned to baseline Postop Assessment: no apparent nausea or vomiting Anesthetic complications: no   No complications documented.  Last Vitals:  Vitals:   07/27/19 1245 07/27/19 1300  BP: (!) 114/94 (!) 145/57  Pulse: 83 83  Resp: 14 13  Temp:  (!) 36.3 C  SpO2: 96% 95%    Last Pain:  Vitals:   07/27/19 1300  TempSrc:   PainSc: 0-No pain                 Brennan Bailey

## 2019-07-27 NOTE — Plan of Care (Signed)

## 2019-07-27 NOTE — Anesthesia Procedure Notes (Signed)
Anesthesia Regional Block: Adductor canal block   Pre-Anesthetic Checklist: ,, timeout performed, Correct Patient, Correct Site, Correct Laterality, Correct Procedure, Correct Position, site marked, Risks and benefits discussed, pre-op evaluation,  At surgeon's request and post-op pain management  Laterality: Left  Prep: Maximum Sterile Barrier Precautions used, chloraprep       Needles:  Injection technique: Single-shot  Needle Type: Echogenic Stimulator Needle     Needle Length: 9cm  Needle Gauge: 22     Additional Needles:   Procedures:,,,, ultrasound used (permanent image in chart),,,,  Narrative:  Start time: 07/27/2019 8:24 AM End time: 07/27/2019 8:26 AM Injection made incrementally with aspirations every 5 mL.  Performed by: Personally  Anesthesiologist: Brennan Bailey, MD  Additional Notes: Risks, benefits, and alternative discussed. Patient gave consent for procedure. Patient prepped and draped in sterile fashion. Sedation administered, patient remains easily responsive to voice. Relevant anatomy identified with ultrasound guidance. Local anesthetic given in 5cc increments with no signs or symptoms of intravascular injection. No pain or paraesthesias with injection. Patient monitored throughout procedure with signs of LAST or immediate complications. Tolerated well. Ultrasound image placed in chart.  Tawny Asal, MD

## 2019-07-27 NOTE — Anesthesia Procedure Notes (Signed)
Anesthesia Regional Block: Adductor canal block   Pre-Anesthetic Checklist: ,, timeout performed, Correct Patient, Correct Site, Correct Laterality, Correct Procedure, Correct Position, site marked, Risks and benefits discussed, pre-op evaluation,  At surgeon's request and post-op pain management  Laterality: Right  Prep: Maximum Sterile Barrier Precautions used, chloraprep       Needles:  Injection technique: Single-shot  Needle Type: Echogenic Stimulator Needle     Needle Length: 9cm  Needle Gauge: 22     Additional Needles:   Procedures:,,,, ultrasound used (permanent image in chart),,,,  Narrative:  Start time: 07/27/2019 8:30 AM End time: 07/27/2019 8:32 AM Injection made incrementally with aspirations every 5 mL.  Performed by: Personally  Anesthesiologist: Brennan Bailey, MD  Additional Notes: Risks, benefits, and alternative discussed. Patient gave consent for procedure. Patient prepped and draped in sterile fashion. Sedation administered, patient remains easily responsive to voice. Relevant anatomy identified with ultrasound guidance. Local anesthetic given in 5cc increments with no signs or symptoms of intravascular injection. No pain or paraesthesias with injection. Patient monitored throughout procedure with signs of LAST or immediate complications. Tolerated well. Ultrasound image placed in chart.  Tawny Asal, MD

## 2019-07-27 NOTE — Anesthesia Procedure Notes (Signed)
Anesthesia Regional Block: Popliteal block   Pre-Anesthetic Checklist: ,, timeout performed, Correct Patient, Correct Site, Correct Laterality, Correct Procedure, Correct Position, site marked, Risks and benefits discussed, pre-op evaluation,  At surgeon's request and post-op pain management  Laterality: Left  Prep: Maximum Sterile Barrier Precautions used, chloraprep       Needles:  Injection technique: Single-shot  Needle Type: Echogenic Stimulator Needle     Needle Length: 9cm  Needle Gauge: 22     Additional Needles:   Procedures:,,,, ultrasound used (permanent image in chart),,,,  Narrative:  Start time: 07/27/2019 8:26 AM End time: 07/27/2019 8:28 AM Injection made incrementally with aspirations every 5 mL.  Performed by: Personally  Anesthesiologist: Brennan Bailey, MD  Additional Notes: Risks, benefits, and alternative discussed. Patient gave consent for procedure. Patient prepped and draped in sterile fashion. Sedation administered, patient remains easily responsive to voice. Relevant anatomy identified with ultrasound guidance. Local anesthetic given in 5cc increments with no signs or symptoms of intravascular injection. No pain or paraesthesias with injection. Patient monitored throughout procedure with signs of LAST or immediate complications. Tolerated well. Ultrasound image placed in chart.  Tawny Asal, MD

## 2019-07-27 NOTE — Progress Notes (Signed)
Patient's dentures and jewelry picked up from [place], Sharyn Lull.

## 2019-07-28 ENCOUNTER — Encounter (HOSPITAL_COMMUNITY): Payer: Self-pay | Admitting: Internal Medicine

## 2019-07-28 DIAGNOSIS — I10 Essential (primary) hypertension: Secondary | ICD-10-CM

## 2019-07-28 DIAGNOSIS — E7800 Pure hypercholesterolemia, unspecified: Secondary | ICD-10-CM

## 2019-07-28 DIAGNOSIS — G8929 Other chronic pain: Secondary | ICD-10-CM

## 2019-07-28 DIAGNOSIS — M542 Cervicalgia: Secondary | ICD-10-CM

## 2019-07-28 DIAGNOSIS — M549 Dorsalgia, unspecified: Secondary | ICD-10-CM

## 2019-07-28 DIAGNOSIS — S82842A Displaced bimalleolar fracture of left lower leg, initial encounter for closed fracture: Secondary | ICD-10-CM

## 2019-07-28 DIAGNOSIS — F419 Anxiety disorder, unspecified: Secondary | ICD-10-CM

## 2019-07-28 DIAGNOSIS — F1190 Opioid use, unspecified, uncomplicated: Secondary | ICD-10-CM | POA: Diagnosis present

## 2019-07-28 DIAGNOSIS — S82851A Displaced trimalleolar fracture of right lower leg, initial encounter for closed fracture: Secondary | ICD-10-CM

## 2019-07-28 DIAGNOSIS — M8000XA Age-related osteoporosis with current pathological fracture, unspecified site, initial encounter for fracture: Secondary | ICD-10-CM

## 2019-07-28 HISTORY — DX: Opioid use, unspecified, uncomplicated: F11.90

## 2019-07-28 LAB — CBC
HCT: 30.4 % — ABNORMAL LOW (ref 36.0–46.0)
Hemoglobin: 9.4 g/dL — ABNORMAL LOW (ref 12.0–15.0)
MCH: 28.2 pg (ref 26.0–34.0)
MCHC: 30.9 g/dL (ref 30.0–36.0)
MCV: 91.3 fL (ref 80.0–100.0)
Platelets: 314 10*3/uL (ref 150–400)
RBC: 3.33 MIL/uL — ABNORMAL LOW (ref 3.87–5.11)
RDW: 18.8 % — ABNORMAL HIGH (ref 11.5–15.5)
WBC: 8.8 10*3/uL (ref 4.0–10.5)
nRBC: 0 % (ref 0.0–0.2)

## 2019-07-28 LAB — COMPREHENSIVE METABOLIC PANEL
ALT: 9 U/L (ref 0–44)
AST: 24 U/L (ref 15–41)
Albumin: 3.1 g/dL — ABNORMAL LOW (ref 3.5–5.0)
Alkaline Phosphatase: 55 U/L (ref 38–126)
Anion gap: 9 (ref 5–15)
BUN: 9 mg/dL (ref 8–23)
CO2: 24 mmol/L (ref 22–32)
Calcium: 8.4 mg/dL — ABNORMAL LOW (ref 8.9–10.3)
Chloride: 106 mmol/L (ref 98–111)
Creatinine, Ser: 0.77 mg/dL (ref 0.44–1.00)
GFR calc Af Amer: >60 mL/min (ref >60)
GFR calc non Af Amer: >60 mL/min (ref >60)
Glucose, Bld: 145 mg/dL — ABNORMAL HIGH (ref 70–99)
Potassium: 3.8 mmol/L (ref 3.5–5.1)
Sodium: 139 mmol/L (ref 135–145)
Total Bilirubin: 0.7 mg/dL (ref 0.3–1.2)
Total Protein: 5.8 g/dL — ABNORMAL LOW (ref 6.5–8.1)

## 2019-07-28 LAB — VITAMIN D 25 HYDROXY (VIT D DEFICIENCY, FRACTURES): Vit D, 25-Hydroxy: 26.88 ng/mL — ABNORMAL LOW (ref 30–100)

## 2019-07-28 MED ORDER — OMEGA-3-ACID ETHYL ESTERS 1 G PO CAPS
1.0000 g | ORAL_CAPSULE | Freq: Every day | ORAL | Status: DC
  Administered 2019-07-28 – 2019-07-30 (×3): 1 g via ORAL
  Filled 2019-07-28 (×3): qty 1

## 2019-07-28 MED ORDER — CHLORHEXIDINE GLUCONATE CLOTH 2 % EX PADS
6.0000 | MEDICATED_PAD | Freq: Every day | CUTANEOUS | Status: DC
  Administered 2019-07-29: 6 via TOPICAL

## 2019-07-28 MED ORDER — FISH OIL 1000 MG PO CAPS: 1.0000 | ORAL_CAPSULE | Freq: Every day | ORAL | Status: DC

## 2019-07-28 MED ORDER — METHOTREXATE 2.5 MG PO TABS
10.0000 mg | ORAL_TABLET | ORAL | Status: DC
  Administered 2019-07-28: 10 mg via ORAL
  Filled 2019-07-28: qty 4

## 2019-07-28 NOTE — Progress Notes (Signed)
PROGRESS NOTE    Jocelyn Bauer  YTK:[id number] DOB: 02/28/1928 DOA: 07/26/2019 PCP: [name], MD     Brief Narrative:  [name] PMHx Anxiety, HTN, HLD, GERD, hypothyroidism, osteoporosis/chronic neck and back pain/ arthritis/RA,Hx ofevision of left total hip/right artificial elbow joint   Comes to the ED after a fall at home after returning from her mailbox and complaining of pain in both of her ankles. Was complaining of pain in the left side of the chest as well after the falland reports as a gas pain and reportsshe gets this intermittently at other x2.Patient's daughter at the bedside assisting with the history. Patient has limited functional ability due to her chronic pain arthritis, lives with her daughter who helps with most of her history of but when she walks around the house she denies any chest pain shortness of breath, no known history of CAD, CKD, CVA.She had her last surgery 5 years ago.Patient denies any nausea, vomiting, shortness of breath, fever, chills, diarrhea, focal weakness.  ED Course:Blood pressure running on higher side 160s to 185/79,routine lab work fairly stable with hypokalemia, EKGin the ED personally reviewed shows sinus rhythm, PVC.X-ray right ankle showed trimalleolar fracture subluxation of the right ankle, Xrau Left Ankle: "Left ankle bimalleolar fracture with complex fracture plane demonstrating both transverse and vertically oriented oblique fracture planes involving the medial malleolus".CT head with no acute finding.Ankles werereduced under sedation And plaint applied in [facility] consulted advised [facility] to [facility] intervention tomorrow.   Subjective: Afebrile overnight A/O x4, negative S OB, negative abdominal pain, negative N/V.  States fell in her driveway.  States would like to go to [facility]   Assessment & Plan: Covid vaccination; negative vaccination   Active Problems:   S/P revision  of left total hip   Presence of right artificial elbow joint   Hypertension   Hypothyroidism   Hyperlipidemia   GERD (gastroesophageal reflux disease)   Anxiety   Osteoporosis   Chronic neck and back pain   Bilateral ankle fractures   Bbilateral ankle fracture  -7/20 s/p ORIF  -Pain control with Tylenol, morphine, Robaxin -   Essential HTN -Amlodipine 5 mg daily -Benazepril 20 mg daily -Hydralazine  PRN  HLD -Lipid panel pending -Omega-3 at home  Hypothyroidism -Synthroid 100 mcg daily   Anxiety disorder -Lorazepam PRN  Rheumatoid arthritis -Methotrexate 10 mg weekly -Prednisone 5 mg daily  Osteoporosis   Hypokalemia -Resolved  Chronic pain syndrome -Controlled on current pain medication      DVT prophylaxis: Lovenox Code Status: DNR Family Communication:  Status is: Inpatient    Dispo: The patient is from: Home              Anticipated d/c is to: [facility]?              Anticipated d/c date is: 8/1              Patient currently unstable      Consultants:  Orthopedic surgery  Procedures/Significant Events:  7/20 bilateral ankle  ORIF   I have personally reviewed and interpreted all radiology studies and my findings are as above.  VENTILATOR SETTINGS:     Cultures 7/19 SARS coronavirus negative   Antimicrobials:    Devices    LINES / TUBES:      Continuous Infusions: . dextrose 5 % and 0.9 % NaCl with KCl 20 mEq/L 75 mL/hr at 07/27/19 1836  . methocarbamol (ROBAXIN) IV  Objective: Vitals:   07/27/19 1752 07/27/19 1934 07/28/19 0314 07/28/19 0828  BP:  (!) 118/44 (!) 114/52 (!) 141/57  Pulse: 88 83 81 75  Resp:  16 16 17   Temp:  98.6 F (37 C) 98.5 F (36.9 C) 98.2 F (36.8 C)  TempSrc:  Oral Oral Oral  SpO2: 92% 95% 96% 98%  Weight:      Height:        Intake/Output Summary (Last 24 hours) at 07/28/2019 0840 Last data filed at 07/28/2019 4782 Gross per 24 hour  Intake 1842.29 ml  Output  2920 ml  Net -1077.71 ml   Filed Weights   07/26/19 2225  Weight: 75.2 kg    Examination:  General: A/O x4, No acute respiratory distress Eyes: negative scleral hemorrhage, negative anisocoria, negative icterus ENT: Negative Runny nose, negative gingival bleeding, Neck:  Negative scars, masses, torticollis, lymphadenopathy, JVD Lungs: Clear to auscultation bilaterally without wheezes or crackles Cardiovascular: Regular rate and rhythm without murmur gallop or rub normal S1 and S2 Abdomen: negative abdominal pain, nondistended, positive soft, bowel sounds, no rebound, no ascites, no appreciable mass Extremities: bilateral lower extremities in casts, patient able to wiggle toes, toes warm to touch Skin: Negative rashes, lesions, ulcers Psychiatric:  Negative depression, negative anxiety, negative fatigue, negative mania  Central nervous system:  Cranial nerves II through XII intact, tongue/uvula midline, all extremities muscle strength 5/5, sensation intact throughout,negative dysarthria, negative expressive aphasia, negative receptive aphasia.  .     Data Reviewed: Care during the described time interval was provided by me .  I have reviewed this patient's available data, including medical history, events of note, physical examination, and all test results as part of my evaluation.  CBC: Recent Labs  Lab 07/26/19 1531 07/27/19 1849 07/28/19 0247  WBC 10.6* 9.1 8.8  NEUTROABS 8.9*  --   --   HGB 11.5* 10.1* 9.4*  HCT 36.8 32.5* 30.4*  MCV 89.8 90.8 91.3  PLT 396 333 956   Basic Metabolic Panel: Recent Labs  Lab 07/26/19 1531 07/27/19 1412 07/27/19 1849 07/28/19 0247  NA 140  --  139 139  K 3.1*  --  3.3* 3.8  CL 100  --  105 106  CO2 28  --  23 24  GLUCOSE 115*  --  197* 145*  BUN 13  --  7* 9  CREATININE 0.79 0.75 0.79 0.77  CALCIUM 9.1  --  8.7* 8.4*  MG  --   --  1.7  --    GFR: Estimated Creatinine Clearance: 47.3 mL/min (by C-G formula based on SCr of  0.77 mg/dL). Liver Function Tests: Recent Labs  Lab 07/27/19 1849 07/28/19 0247  AST 27 24  ALT 12 9  ALKPHOS 63 55  BILITOT 0.8 0.7  PROT 6.2* 5.8*  ALBUMIN 3.4* 3.1*   No results for input(s): LIPASE, AMYLASE in the last 168 hours. No results for input(s): AMMONIA in the last 168 hours. Coagulation Profile: No results for input(s): INR, PROTIME in the last 168 hours. Cardiac Enzymes: No results for input(s): CKTOTAL, CKMB, CKMBINDEX, TROPONINI in the last 168 hours. BNP (last 3 results) No results for input(s): PROBNP in the last 8760 hours. HbA1C: No results for input(s): HGBA1C in the last 72 hours. CBG: No results for input(s): GLUCAP in the last 168 hours. Lipid Profile: No results for input(s): CHOL, HDL, LDLCALC, TRIG, CHOLHDL, LDLDIRECT in the last 72 hours. Thyroid Function Tests: No results for input(s): TSH, T4TOTAL, FREET4, T3FREE, THYROIDAB  in the last 72 hours. Anemia Panel: No results for input(s): VITAMINB12, FOLATE, FERRITIN, TIBC, IRON, RETICCTPCT in the last 72 hours. Sepsis Labs: No results for input(s): PROCALCITON, LATICACIDVEN in the last 168 hours.  Recent Results (from the past 240 hour(s))  SARS Coronavirus 2 by RT PCR (hospital order, performed in [facility] hospital lab) Nasopharyngeal Nasopharyngeal Swab     Status: None   Collection Time: 07/26/19  5:10 PM   Specimen: Nasopharyngeal Swab  Result Value Ref Range Status   SARS Coronavirus 2 NEGATIVE NEGATIVE Final    Comment: (NOTE) SARS-CoV-2 target nucleic acids are NOT DETECTED.  The SARS-CoV-2 RNA is generally detectable in upper and lower respiratory specimens during the acute phase of infection. The lowest concentration of SARS-CoV-2 viral copies this assay can detect is 250 copies / mL. A negative result does not preclude SARS-CoV-2 infection and should not be used as the sole basis for treatment or other patient management decisions.  A negative result may occur with improper  specimen collection / handling, submission of specimen other than nasopharyngeal swab, presence of viral mutation(s) within the areas targeted by this assay, and inadequate number of viral copies (<250 copies / mL). A negative result must be combined with clinical observations, patient history, and epidemiological information.  Fact Sheet for Patients:   StrictlyIdeas.no  Fact Sheet for Healthcare Providers: BankingDealers.co.za  This test is not yet approved or  cleared by the [place] FDA and has been authorized for detection and/or diagnosis of SARS-CoV-2 by FDA under an Emergency Use Authorization (EUA).  This EUA will remain in effect (meaning this test can be used) for the duration of the COVID-19 declaration under Section 564(b)(1) of the Act, 21 U.S.C. section 360bbb-3(b)(1), unless the authorization is terminated or revoked sooner.  Performed at [facility], [place] [address] [place], [place] [address]   Surgical pcr screen     Status: None   Collection Time: 07/27/19  1:15 AM   Specimen: Nasal Mucosa; Nasal Swab  Result Value Ref Range Status   MRSA, PCR NEGATIVE NEGATIVE Final   Staphylococcus aureus NEGATIVE NEGATIVE Final    Comment: (NOTE) The Xpert SA Assay (FDA approved for NASAL specimens in patients 8 years of age and older), is one component of a comprehensive surveillance program. It is not intended to diagnose infection nor to guide or monitor treatment. Performed at [facility], [place] [address], [place], [place] [address]          Radiology Studies: DG Ribs Unilateral W/Chest Left  Result Date: 07/26/2019 CLINICAL DATA:  Fall, bilateral ankle fracture, chest pain EXAM: LEFT RIBS AND CHEST - 3+ VIEW COMPARISON:  None. FINDINGS: No fracture or other bone lesions are seen involving the ribs. There is no evidence of pneumothorax or pleural effusion. Both lungs are clear. Heart  size and mediastinal contours are within normal limits. Bilateral elbow arthroplasty is incidentally noted. IMPRESSION: Negative. Electronically Signed   By: Fidela Salisbury MD   On: 07/26/2019 17:21   DG Pelvis 1-2 Views  Result Date: 07/26/2019 CLINICAL DATA:  Fall, bilateral ankle fracture EXAM: PELVIS - 1-2 VIEW COMPARISON:  03/11/2011 FINDINGS: The osseous structures are mildly osteopenic. Surgical changes of left total hip arthroplasty are identified. Arthroplasty components overlie the expected position. No fracture or dislocation identified. Limited evaluation of the right hip is unremarkable. Heterotopic ossification surrounds the left hip. Vascular calcifications are seen within the pelvis. IMPRESSION: No acute fracture or dislocation. Electronically Signed  By: Fidela Salisbury MD   On: 07/26/2019 17:20   DG Elbow 2 Views Right  Result Date: 07/26/2019 CLINICAL DATA:  Pain status post fall EXAM: RIGHT ELBOW - 2 VIEW COMPARISON:  None. FINDINGS: The patient has undergone prior elbow prosthesis placement. No prior studies available for comparison. The hardware appears grossly intact. There appears to be extensive heterotopic ossification about the elbow. There is surrounding soft tissue swelling. There is likely a joint effusion with intra-articular debris. There is no evidence for a periprosthetic fracture. IMPRESSION: 1. Evaluation is limited by lack of a prior comparison. 2. There is a right elbow prosthesis that appears grossly intact where visualized. 3. No definite evidence for an acute displaced fracture. 4. There is extensive surrounding heterotopic ossification with a probable large joint effusion with intra-articular debris. Comparison to an outside prior study would be useful. Electronically Signed   By: Constance Holster M.D.   On: 07/26/2019 19:55   DG Ankle 2 Views Right  Result Date: 07/26/2019 CLINICAL DATA:  Post reduction of the right ankle EXAM: RIGHT ANKLE - 2 VIEW COMPARISON:   Radiograph 07/26/2019 FINDINGS: Post closed reduction and splinting of the previously seen trimalleolar fracture of the right ankle including an oblique trans syndesmotic distal fibular fracture, transversely oriented medial malleolar fracture and posterior malleolar fracture which are in grossly improved alignment from comparison. Some persistent asymmetric widening of the medial clear space suggestive of some residual talar shift in the setting of this unstable ankle injury. Extensive circumferential soft tissue swelling of the ankle and ankle joint effusion are better visualized on the un splinted images. Severe talonavicular arthrosis is similar to prior. Bidirectional calcaneal spurs are again seen. Midfoot and hindfoot alignment is grossly preserved though incompletely assessed on nondedicated, nonweightbearing films. IMPRESSION: 1. Post closed reduction and splinting of trimalleolar fracture of the right ankle, in grossly improved alignment from comparison albeit with some residual mild fracture displacement and lateral talar shift. 2. Additional swelling and ankle effusion, better seen on prior. 3. Fracture pattern compatible with a Weber B stage IV unstable ankle injury. Electronically Signed   By: Lovena Le M.D.   On: 07/26/2019 18:39   DG Ankle Complete Left  Result Date: 07/27/2019 CLINICAL DATA:  ORIF BILATERAL ankle fractures EXAM: LEFT ANKLE COMPLETE - 3+ VIEW COMPARISON:  07/26/2019 FLUOROSCOPY TIME:  0 minutes 35 seconds Dose: 0.94 mGy Images: 3 FINDINGS: Osseous demineralization. Two cannulated screws placed across a reduced medial malleolar fracture. Lateral plate and screws placed across a reduced oblique distal LEFT fibular fracture. Two K-wires been placed across the calcaneus, talus into distal tibia. Minimal narrowing of ankle joint. IMPRESSION: Post bimalleolar fracture LEFT ankle. Electronically Signed   By: Lavonia Dana M.D.   On: 07/27/2019 14:17   DG Ankle Complete  Left  Result Date: 07/26/2019 CLINICAL DATA:  Fall, left ankle tenderness EXAM: LEFT ANKLE COMPLETE - 3+ VIEW COMPARISON:  None. FINDINGS: Three view radiograph of left ankle excludes the a posterior calcaneus on lateral examination. Bimalleolar fracture of the left ankle is identified. Complex fracture plane involving the medial malleolus is identified with a transverse fracture plane noted posteriorly, with a more oblique, vertically oriented component anteriorly with resultant mild displacement and minimal medial angulation of the medial malleolar fracture fragment. Oblique fracture of the distal fibular metaphysis is also identified with the fracture plane extending to the tibial plafond with minimal lateral angulation of the distal fracture fragment. Extensive soft tissue swelling superficial to the a lateral malleolus.  No other fracture identified. IMPRESSION: Left ankle bimalleolar fracture with complex fracture plane demonstrating both transverse and vertically oriented oblique fracture planes involving the medial malleolus. Electronically Signed   By: Fidela Salisbury MD   On: 07/26/2019 17:18   DG Ankle Complete Right  Result Date: 07/27/2019 CLINICAL DATA:  ORIF BILATERAL ankle fractures EXAM: DG C-ARM 1-60 MIN; RIGHT ANKLE - COMPLETE 3+ VIEW FLUOROSCOPY TIME:  Fluoroscopy Time:  1 minutes 4 seconds Radiation Exposure Index (if provided by the fluoroscopic device): 1.83 mGy Number of Acquired Spot Images: 3 COMPARISON:  01/26/2019 FINDINGS: Two cannulated screws placed across reduced medial malleolar fracture. Lateral plate and multiple screws placed across a reduced distal RIGHT fibular fracture. Ankle joint alignment normal. Two K-wires traverse the calcaneus, talus and distal tibia. Diffuse osseous demineralization with minimal narrowing of ankle joint. IMPRESSION: Post bimalleolar ORIF RIGHT ankle as above. Electronically Signed   By: Lavonia Dana M.D.   On: 07/27/2019 14:16   DG Ankle Complete  Right  Result Date: 07/26/2019 CLINICAL DATA:  Fall, right ankle tenderness EXAM: RIGHT ANKLE - COMPLETE 3+ VIEW COMPARISON:  None. FINDINGS: Three view radiograph of the right ankle excludes the posterior aspect of the right heel on lateral examination. These images demonstrate a trimalleolar fracture of the right ankle. Oblique fracture of the a distal right fibular metaphysis at the level of the tibial plafond with 1/2 shaft with lateral displacement and approximately 7-8 mm override and approximately 25 mm posterolateral angulation of the distal fracture fragment. Transverse fracture of the medial malleolus with minimal lateral angulation approximately 1 cm lateral displacement of the distal fracture fragment Minimally displaced fracture of the posterior malleolus with minimal posterolateral displacement of the fracture fragment. Resultant 1 cm lateral subluxation of the talar dome in relation to the tibial plafond. Superimposed severe talonavicular degenerative arthritis. Extensive bimalleolar soft tissue swelling. IMPRESSION: Trimalleolar fracture subluxation of the right ankle as described above. Electronically Signed   By: Fidela Salisbury MD   On: 07/26/2019 17:14   CT Head Wo Contrast  Result Date: 07/26/2019 CLINICAL DATA:  Head trauma, head injury following fall EXAM: CT HEAD WITHOUT CONTRAST TECHNIQUE: Contiguous axial images were obtained from the base of the skull through the vertex without intravenous contrast. COMPARISON:  09/23/2018 FINDINGS: Brain: Normal anatomic configuration. Moderate periventricular white matter changes are present likely reflecting the sequela of small vessel ischemia. Remote lacunar infarct within the anterior limb of the right internal capsule is again identified. No abnormal intra or extra-axial mass lesion or fluid collection. No abnormal mass effect or midline shift. No evidence of acute intracranial hemorrhage or infarct. Ventricular size is normal. Cerebellum  unremarkable. Vascular: Extensive vascular calcifications are again seen within the distal left vertebral artery and proximal basilar artery. No hyperdense vasculature at the skull base. Skull: Intact Sinuses/Orbits: Paranasal sinuses are clear. Orbits are unremarkable. Other: Mastoid air cells and middle ear cavities are clear. IMPRESSION: No acute intracranial injury.  No calvarial fracture. Electronically Signed   By: Fidela Salisbury MD   On: 07/26/2019 17:09   DG Ankle Left Port  Result Date: 07/27/2019 CLINICAL DATA:  Post bimalleolar ORIF LEFT ankle EXAM: PORTABLE LEFT ANKLE - 2 VIEW COMPARISON:  Portable exam 1315 hours compared to earlier intraoperative images FINDINGS: Plaster cast material obscures bone detail. K-wires from plantar broach traverse the calcaneus, talus and into the distal tibia. Lateral plate and multiple screws at distal fibula post ORIF. Two cannulated screws at medial malleolus post ORIF. No additional  fracture dislocation. IMPRESSION: Post bimalleolar ORIF LEFT ankle. Electronically Signed   By: Lavonia Dana M.D.   On: 07/27/2019 14:19   DG Ankle Right Port  Result Date: 07/27/2019 CLINICAL DATA:  Post ORIF RIGHT ankle EXAM: PORTABLE RIGHT ANKLE - 2 VIEW COMPARISON:  Portable exam 1319 hours compared to earlier intraoperative images FINDINGS: Plaster cast material obscures bone detail. Diffuse osseous demineralization. Two cannulated screws at medial malleolus post ORIF. Lateral plate and screws at distal fibula post ORIF. Two K-wires from plantar approach traverse the calcaneus, talus and into distal tibia. No additional fracture or dislocation seen. Advanced degenerative changes of the talonavicular joint. IMPRESSION: Post bimalleolar ORIF as above. Electronically Signed   By: Lavonia Dana M.D.   On: 07/27/2019 14:22   DG C-Arm 1-60 Min  Result Date: 07/27/2019 CLINICAL DATA:  ORIF BILATERAL ankle fractures EXAM: DG C-ARM 1-60 MIN; RIGHT ANKLE - COMPLETE 3+ VIEW FLUOROSCOPY  TIME:  Fluoroscopy Time:  1 minutes 4 seconds Radiation Exposure Index (if provided by the fluoroscopic device): 1.83 mGy Number of Acquired Spot Images: 3 COMPARISON:  01/26/2019 FINDINGS: Two cannulated screws placed across reduced medial malleolar fracture. Lateral plate and multiple screws placed across a reduced distal RIGHT fibular fracture. Ankle joint alignment normal. Two K-wires traverse the calcaneus, talus and distal tibia. Diffuse osseous demineralization with minimal narrowing of ankle joint. IMPRESSION: Post bimalleolar ORIF RIGHT ankle as above. Electronically Signed   By: Lavonia Dana M.D.   On: 07/27/2019 14:16        Scheduled Meds: . acetaminophen  650 mg Oral TID  . amLODipine  5 mg Oral q morning - 10a  . benazepril  20 mg Oral q morning - 10a  . Chlorhexidine Gluconate Cloth  6 each Topical Daily  . docusate sodium  200 mg Oral BID  . enoxaparin (LOVENOX) injection  40 mg Subcutaneous Q24H  . levothyroxine  100 mcg Oral QAC breakfast  . pantoprazole  40 mg Oral Daily  . polyethylene glycol  17 g Oral BID  . predniSONE  5 mg Oral Daily   Continuous Infusions: . dextrose 5 % and 0.9 % NaCl with KCl 20 mEq/L 75 mL/hr at 07/27/19 1836  . methocarbamol (ROBAXIN) IV       LOS: 2 days    Time spent:40 min    Daesia Zylka, Geraldo Docker, MD [facility] Pager [phone]  If 7PM-7AM, please contact night-coverage [email or web address] Password [email or web address] 07/28/2019, 8:40 AM

## 2019-07-28 NOTE — TOC Initial Note (Addendum)
Transition of Care [facility]) - Initial/Assessment Note    Patient Details  Name: Jocelyn Bauer MRN: [id number] Date of Birth: February 15, 1928  [facility]) CM/SW Contact:    Sharin Mons, RN Phone Number: [phone] 07/28/2019, 10:01 AM  Clinical Narrative:   Admitted after fall, suffered bilateral  ankle  Fxs. From home with husband.        S/p bilateral ORIF to ankles, 7/20  NCM received consult for possible SNF placement at time of discharge. NCM spoke with patient regarding PT recommendation of SNF placement at time of discharge. Patient reported that patient's spouse is currently unable to care for patient at their home given patient's current physical needs and fall risk. Patient expressed understanding of PT recommendation and is agreeable to SNF placement at time of discharge. Patient reports preference for [facility] SNF  . NCM discussed insurance authorization process and provided Medicare SNF ratings list. Patient expressed being hopeful for rehab and to feel better soon. No further questions reported at this time. NCM to continue to follow and assist with discharge planning needs.   07/28/2019 Pt has not been COVID vaccinated.  7/22 Pt and son made aware bed offer extended with [facility], pt accepted.  Expected Discharge Plan: Skilled Nursing Facility Barriers to Discharge: Insurance Authorization, No SNF bed, Continued Medical Work up   Patient Goals and CMS Choice        Expected Discharge Plan and Services Expected Discharge Plan: [place]     Prior Living Arrangements/Services                       Activities of Daily Living      Permission Sought/Granted                  Emotional Assessment              Admission diagnosis:  Pain [R52] Reduction defect of lower extremity [Q72.90] Bilateral ankle fractures [Z66.294T, S82.892A] Closed trimalleolar fracture of right ankle, initial encounter  [S82.851A] Bimalleolar ankle fracture, left, closed, initial encounter [M54.650P] Patient Active Problem List   Diagnosis Date Noted  . Bilateral ankle fractures 07/26/2019  . Right elbow pain 07/14/2015  . Presence of right artificial elbow joint 07/13/2015  . Hypertension   . Hypothyroidism   . Hyperlipidemia   . GERD (gastroesophageal reflux disease)   . Anxiety   . Osteoporosis   . Chronic neck and back pain   . 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   PCP:  [name], MD Pharmacy:   [facility], [address], [address] [place], [place] [address] Phone: [phone] Fax: [facility] [address] [place] [address] [place] [address] [place] [address] Phone: [phone] Fax: [phone]     Social Determinants of Health (SDOH) Interventions    Readmission Risk Interventions No flowsheet data found.

## 2019-07-28 NOTE — Progress Notes (Signed)
Orthopaedic Trauma Service Progress Note  Patient ID: Jocelyn Bauer MRN: [id number] DOB/AGE: [birth year] 86 y.o.  Subjective:  No specific complaints this am  Blocks have worn off but pain controlled  Reports regular falls. Has walker and canes but doesn't really use them   States she has been on fentanyl patch and percocet for "years"  Has been on prolia for about 5 years   [facility] [employer]    ROS As above  Objective:   VITALS:   Vitals:   07/27/19 1752 07/27/19 1934 07/28/19 0314 07/28/19 0828  BP:  (!) 118/44 (!) 114/52 (!) 141/57  Pulse: 88 83 81 75  Resp:  16 16 17   Temp:  98.6 F (37 C) 98.5 F (36.9 C) 98.2 F (36.8 C)  TempSrc:  Oral Oral Oral  SpO2: 92% 95% 96% 98%  Weight:      Height:        Estimated body mass index is 28.46 kg/m as calculated from the following:   Height as of this encounter: 5\' 4"  (1.626 m).   Weight as of this encounter: 75.2 kg.   Intake/Output      07/20 0701 - 07/21 0700 07/21 0701 - 07/22 0700   I.V. (mL/kg) 1684.8 (22.4)    IV Piggyback 300    Total Intake(mL/kg) 1984.8 (26.4)    Urine (mL/kg/hr) 2900 (1.6)    Blood 20    Total Output 2920    Net -935.2         Urine Occurrence 1 x      LABS  Results for orders placed or performed during the hospital encounter of 07/26/19 (from the past 24 hour(s))  Creatinine, serum     Status: None   Collection Time: 07/27/19  2:12 PM  Result Value Ref Range   Creatinine, Ser 0.75 0.44 - 1.00 mg/dL   GFR calc non Af Amer >60 >60 mL/min   GFR calc Af Amer >60 >60 mL/min  CBC     Status: Abnormal   Collection Time: 07/27/19  6:49 PM  Result Value Ref Range   WBC 9.1 4.0 - 10.5 K/uL   RBC 3.58 (L) 3.87 - 5.11 MIL/uL   Hemoglobin 10.1 (L) 12.0 - 15.0 g/dL   HCT 32.5 (L) 36 - 46 %   MCV 90.8 80.0 - 100.0 fL   MCH 28.2 26.0 - 34.0 pg   MCHC 31.1 30.0 - 36.0 g/dL   RDW 18.6 (H)  11.5 - 15.5 %   Platelets 333 150 - 400 K/uL   nRBC 0.0 0.0 - 0.2 %  Comprehensive metabolic panel     Status: Abnormal   Collection Time: 07/27/19  6:49 PM  Result Value Ref Range   Sodium 139 135 - 145 mmol/L   Potassium 3.3 (L) 3.5 - 5.1 mmol/L   Chloride 105 98 - 111 mmol/L   CO2 23 22 - 32 mmol/L   Glucose, Bld 197 (H) 70 - 99 mg/dL   BUN 7 (L) 8 - 23 mg/dL   Creatinine, Ser 0.79 0.44 - 1.00 mg/dL   Calcium 8.7 (L) 8.9 - 10.3 mg/dL   Total Protein 6.2 (L) 6.5 - 8.1 g/dL   Albumin 3.4 (L) 3.5 - 5.0 g/dL   AST 27 15 - 41 U/L  ALT 12 0 - 44 U/L   Alkaline Phosphatase 63 38 - 126 U/L   Total Bilirubin 0.8 0.3 - 1.2 mg/dL   GFR calc non Af Amer >60 >60 mL/min   GFR calc Af Amer >60 >60 mL/min   Anion gap 11 5 - 15  Magnesium     Status: None   Collection Time: 07/27/19  6:49 PM  Result Value Ref Range   Magnesium 1.7 1.7 - 2.4 mg/dL  Comprehensive metabolic panel     Status: Abnormal   Collection Time: 07/28/19  2:47 AM  Result Value Ref Range   Sodium 139 135 - 145 mmol/L   Potassium 3.8 3.5 - 5.1 mmol/L   Chloride 106 98 - 111 mmol/L   CO2 24 22 - 32 mmol/L   Glucose, Bld 145 (H) 70 - 99 mg/dL   BUN 9 8 - 23 mg/dL   Creatinine, Ser 0.77 0.44 - 1.00 mg/dL   Calcium 8.4 (L) 8.9 - 10.3 mg/dL   Total Protein 5.8 (L) 6.5 - 8.1 g/dL   Albumin 3.1 (L) 3.5 - 5.0 g/dL   AST 24 15 - 41 U/L   ALT 9 0 - 44 U/L   Alkaline Phosphatase 55 38 - 126 U/L   Total Bilirubin 0.7 0.3 - 1.2 mg/dL   GFR calc non Af Amer >60 >60 mL/min   GFR calc Af Amer >60 >60 mL/min   Anion gap 9 5 - 15  CBC     Status: Abnormal   Collection Time: 07/28/19  2:47 AM  Result Value Ref Range   WBC 8.8 4.0 - 10.5 K/uL   RBC 3.33 (L) 3.87 - 5.11 MIL/uL   Hemoglobin 9.4 (L) 12.0 - 15.0 g/dL   HCT 30.4 (L) 36 - 46 %   MCV 91.3 80.0 - 100.0 fL   MCH 28.2 26.0 - 34.0 pg   MCHC 30.9 30.0 - 36.0 g/dL   RDW 18.8 (H) 11.5 - 15.5 %   Platelets 314 150 - 400 K/uL   nRBC 0.0 0.0 - 0.2 %     PHYSICAL EXAM:    Gen: resting comfortably in bed, NAD, very pleasant  Lungs: unlabored Ext:       B Lower Extremities  Splints c/d/i B   Ext warm   Swelling controlled  + DP pulses B   No pain out of proportion with passive stretching B   EHL, FHL, lesser toe motor intact B   Dystrophic toe nail B   Good perfusion distally   Assessment/Plan: 1 Day Post-Op   Active Problems:   S/P revision of left total hip   Presence of right artificial elbow joint   Hypertension   Hypothyroidism   Hyperlipidemia   GERD (gastroesophageal reflux disease)   Anxiety   Osteoporosis   Chronic neck and back pain   Bilateral ankle fractures   Anti-infectives (From admission, onward)   Start     Dose/Rate Route Frequency Ordered Stop   07/27/19 1500  ceFAZolin (ANCEF) IVPB 1 g/50 mL premix        1 g 100 mL/hr over 30 Minutes Intravenous Every 6 hours 07/27/19 1333 07/28/19 0302   07/27/19 0822  ceFAZolin (ANCEF) 2-4 GM/100ML-% IVPB       Note to Pharmacy: Gregery Na   : cabinet override      07/27/19 0822 07/27/19 2029    .  POD/HD#: 1  86 y/o female s/p GLF with B ankle fractures   -ground  level fall   - B ankle fractures s/p ORIF   NWB B LEx x 6 weeks  Splint x 2 weeks then likely conversion to B Short leg casts (SLC)  Continue with ice and elevation   Unrestricted B knee ROM    Pt not allowed to use R arm to pull self up as she has a total elbow arthroplasty    She is really bed to chair, slide or lift transfer x 6 weeks    PT/OT evals    SNF at dc     - Pain management:  No further escalation in pain meds    On duragesic patch 50 mcg/hr and percocet 10/325 q8h prn PTA    Think pain meds and Benzos contributory to falls    Maximize nonopioid pain control   - ABL anemia/Hemodynamics  Monitor   Currently stable  - Medical issues   Per primary   - DVT/PE prophylaxis:  Lovenox while inpatient   Possibly convert to DOAC at dc but I am concerned given her fall history   -  ID:   periop abx  - Metabolic Bone Disease:  On prolia, known osteoporosis   Vitamin d insufficiency    Supplement   Given history she would likely benefit from an anabolic agent   Will need updated dexa   - Activity:  As above    -Ex-fix/Splint care:  Keep splints clean and dry   Do not remove  - Impediments to fracture healing:  Many    Osteoporosis   Chronic opioid use    Frequent falls  - Dispo:  Therapy evals  SNF  Ortho issues stable    Jari Pigg, PA-C [phone] (C) 07/28/2019, 9:58 AM  [facility] [place]  [phone] (F)

## 2019-07-28 NOTE — TOC CAGE-AID Note (Signed)
Transition of Care [facility]) - CAGE-AID Screening   Patient Details  Name: Jocelyn Bauer MRN: [id number] Date of Birth: August 25, 1928  [facility]) CM/SW Contact:    Emeterio Reeve, [place] Phone Number: 07/28/2019, 1:54 PM   Clinical Narrative:  CSW met with pt at bedside. CSW introduced self and explained her role at the hospital.  Pt denied alcohol use and substance use. Pt does not need resources at this time.   CAGE-AID Screening:    Have You Ever Felt You Ought to Cut Down on Your Drinking or Drug Use?: No Have People Annoyed You By Critizing Your Drinking Or Drug Use?: No Have You Felt Bad Or Guilty About Your Drinking Or Drug Use?: No Have You Ever Had a Drink or Used Drugs First Thing In The Morning to Steady Your Nerves or to Get Rid of a Hangover?: No CAGE-AID Score: 0  Substance Abuse Education Offered: Yes    Blima Ledger, [place] Social Worker [phone]

## 2019-07-28 NOTE — Evaluation (Signed)
Occupational Therapy Evaluation Patient Details Name: Jocelyn Bauer MRN: [id number] DOB: 08/16/1928 Today's Date: 07/28/2019    History of Present Illness 86yo female who fell at home and sustained L ankle bimalleolar fracture with complex fracture plane of medial malleolus, also trimalleolar fracture/subluxation of R ankle. CTH negative for actue injury. Received B ankle ORIF 07/27/19. PMH osteoporosis, OA, hypothyroidism, HTN, HLD, chronic pain, R triceps tendon repair, B total elbow arthroplasties, B THAs   Clinical Impression   Pt was functioning independent with AD prior to admission. Family suspects multiple falls at home. Pt presents with B LE pain, generalized weakness, impaired cognition and decreased sitting balance. She requires set up to total assist for ADL and will need SNF upon discharge. Will follow acutely.    Follow Up Recommendations  SNF;Supervision/Assistance - 24 hour    Equipment Recommendations  Other (comment) (defer to next venue)    Recommendations for Other Services       Precautions / Restrictions Precautions Precautions: Fall Restrictions Weight Bearing Restrictions: Yes RLE Weight Bearing: Non weight bearing LLE Weight Bearing: Non weight bearing      Mobility Bed Mobility Overal bed mobility: Needs Assistance Bed Mobility: Supine to Sit;Sit to Supine     Supine to sit: Max assist Sit to supine: Max assist   General bed mobility comments: assist for all aspects  Transfers                 General transfer comment: deferred-will need lift equipment    Balance Overall balance assessment: Needs assistance   Sitting balance-Leahy Scale: Poor Sitting balance - Comments: progressed to supervision Postural control: Posterior lean                                 ADL either performed or assessed with clinical judgement   ADL Overall ADL's : Needs assistance/impaired Eating/Feeding: Set up;Bed level   Grooming:  Sitting;Supervision/safety   Upper Body Bathing: Moderate assistance;Sitting   Lower Body Bathing: Total assistance;Bed level   Upper Body Dressing : Minimal assistance;Sitting   Lower Body Dressing: Total assistance;Bed level       Toileting- Clothing Manipulation and Hygiene: Total assistance;Bed level               Vision Patient Visual Report: No change from baseline       Perception     Praxis      Pertinent Vitals/Pain Pain Assessment: Faces Faces Pain Scale: Hurts little more Pain Location: B ankles/LEs Pain Descriptors / Indicators: Discomfort;Aching;Sore Pain Intervention(s): Monitored during session     Hand Dominance Right   Extremity/Trunk Assessment Upper Extremity Assessment Upper Extremity Assessment: LUE deficits/detail LUE Deficits / Details: longstanding elbow ROM deficits LUE Coordination: decreased gross motor   Lower Extremity Assessment Lower Extremity Assessment: Defer to PT evaluation   Cervical / Trunk Assessment Cervical / Trunk Assessment: Kyphotic   Communication Communication Communication: No difficulties   Cognition Arousal/Alertness: Awake/alert Behavior During Therapy: Flat affect Overall Cognitive Status: Impaired/Different from baseline Area of Impairment: Orientation;Attention;Memory;Following commands;Safety/judgement;Awareness;Problem solving                 Orientation Level: Disoriented to;Situation;Time Current Attention Level: Sustained Memory: Decreased recall of precautions;Decreased short-term memory Following Commands: Follows one step commands inconsistently;Follows one step commands with increased time Safety/Judgement: Decreased awareness of safety;Decreased awareness of deficits Awareness: Intellectual Problem Solving: Slow processing;Decreased initiation;Difficulty sequencing;Requires verbal cues;Requires tactile cues  General Comments       Exercises     Shoulder Instructions       Home Living Family/patient expects to be discharged to:: Private residence Living Arrangements: Children Available Help at Discharge: Family;Available PRN/intermittently Type of Home: House Home Access: Stairs to enter [employer] of Steps: 4 Entrance Stairs-Rails: Can reach both Home Layout: One level     Bathroom Shower/Tub: Teacher, early years/pre: Standard     Home Equipment: Environmental consultant - 2 wheels;Walker - standard;Walker - 4 wheels;Bedside commode;Cane - single point;Shower seat          Prior Functioning/Environment Level of Independence: Independent with assistive device(s)        Comments: pt with h/o falls        OT Problem List: Decreased strength;Impaired balance (sitting and/or standing);Decreased coordination;Decreased cognition;Decreased safety awareness;Decreased knowledge of use of DME or AE;Impaired UE functional use;Pain      OT Treatment/Interventions: Self-care/ADL training;DME and/or AE instruction;Therapeutic activities;Patient/family education;Balance training;Cognitive remediation/compensation    OT Goals(Current goals can be found in the care plan section) Acute Rehab OT Goals Patient Stated Goal: go to rehab prior to return home OT Goal Formulation: With patient Time For Goal Achievement: 08/11/19 Potential to Achieve Goals: Fair ADL Goals Pt Will Perform Upper Body Bathing: with supervision;sitting Pt Will Perform Upper Body Dressing: with supervision;sitting Additional ADL Goal #1: Pt will perform bed mobility with moderate assistance in preparation for ADL. Additional ADL Goal #2: Pt will participate in ADL at EOB reaching out of BoS with supervision in preparation for ADL.  OT Frequency: Min 2X/week   Barriers to D/C: Decreased caregiver support          Co-evaluation              AM-PAC OT "6 Clicks" Daily Activity     Outcome Measure Help from another person eating meals?: None Help from another  person taking care of personal grooming?: A Little Help from another person toileting, which includes using toliet, bedpan, or urinal?: Total Help from another person bathing (including washing, rinsing, drying)?: A Lot Help from another person to put on and taking off regular upper body clothing?: A Little Help from another person to put on and taking off regular lower body clothing?: Total 6 Click Score: 14   End of Session    Activity Tolerance: Patient tolerated treatment well Patient left: in bed;with call bell/phone within reach;with bed alarm set  OT Visit Diagnosis: Muscle weakness (generalized) (M62.81);Pain;Other symptoms and signs involving cognitive function                Time: 1610-9604 OT Time Calculation (min): 21 min Charges:  OT General Charges $OT Visit: 1 Visit OT Evaluation $OT Eval Moderate Complexity: 1 Mod  Nestor Lewandowsky, OTR/L Acute Rehabilitation Services Pager: [phone] Office: [phone]  Malka So 07/28/2019, 12:40 PM

## 2019-07-28 NOTE — NC FL2 (Signed)
[place] MEDICAID FL2 LEVEL OF CARE SCREENING TOOL     IDENTIFICATION  Patient Name: Jocelyn Bauer Birthdate: 1928-10-13 Sex: female Admission Date (Current Location): 07/26/2019  [facility] [place] and [place] Number:  Herbalist and Address:  [facility], [place] [address], [place], [place] [address]      Provider Number: [id number]  Attending Physician Name and Address:  Allie Bossier, MD  Relative Name and Phone Number:       Current Level of Care: Hospital Recommended Level of Care: [place] Prior Approval Number:    Date Approved/Denied:   PASRR Number: [id number]  Discharge Plan: SNF    Current Diagnoses: Patient Active Problem List   Diagnosis Date Noted  . Bilateral ankle fractures 07/26/2019  . Right elbow pain 07/14/2015  . Presence of right artificial elbow joint 07/13/2015  . Hypertension   . Hypothyroidism   . Hyperlipidemia   . GERD (gastroesophageal reflux disease)   . Anxiety   . Osteoporosis   . Chronic neck and back pain   . 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    Orientation RESPIRATION BLADDER Height & Weight     Self, Situation, Place  Normal Continent Weight: 75.2 kg Height:  5\' 4"  (162.6 cm)  BEHAVIORAL SYMPTOMS/MOOD NEUROLOGICAL BOWEL NUTRITION STATUS      Continent Diet  AMBULATORY STATUS COMMUNICATION OF NEEDS Skin   Extensive Assist Verbally Surgical wounds (B ankle ORIF 07/27/19.)                       Personal Care Assistance Level of Assistance  Bathing, Feeding, Dressing Bathing Assistance: Maximum assistance Feeding assistance: Independent Dressing Assistance: Maximum assistance     Functional Limitations Info  Sight, Hearing, Speech Sight Info: Adequate Hearing Info: Adequate Speech Info: Adequate    SPECIAL CARE FACTORS FREQUENCY  PT (By licensed PT), OT (By licensed OT)     PT Frequency: 5x / week , evaluate and treat OT Frequency: 5x / week , evaluate and  treat            Contractures Contractures Info: Not present    Additional Factors Info  Code Status, Allergies Code Status Info: DNR Allergies Info: Contrast Media Iodinated Diagnostic Agents Tdap Tetanus-diphth-acell Pertussis, Iodine, Streptococcus Diplococcus Pneumoniae Streptococci           Current Medications (07/28/2019):  This is the current hospital active medication list Current Facility-Administered Medications  Medication Dose Route Frequency Provider Last Rate Last Admin  . acetaminophen (TYLENOL) tablet 650 mg  650 mg Oral TID Georgette Shell, MD   650 mg at 07/28/19 0820  . amLODipine (NORVASC) tablet 5 mg  5 mg Oral q morning - 10a Ainsley Spinner, PA-C   5 mg at 07/28/19 0854  . benazepril (LOTENSIN) tablet 20 mg  20 mg Oral q morning - 10a Ainsley Spinner, PA-C   20 mg at 07/28/19 0854  . bisacodyl (DULCOLAX) EC tablet 5 mg  5 mg Oral Daily PRN Ainsley Spinner, PA-C      . Chlorhexidine Gluconate Cloth 2 % PADS 6 each  6 each Topical Daily Altamese Pump Back, MD      . dextrose 5 % and 0.9 % NaCl with KCl 20 mEq/L infusion   Intravenous Continuous Ainsley Spinner, PA-C 75 mL/hr at 07/27/19 1836 Rate Verify at 07/27/19 1836  . docusate sodium (COLACE) capsule 200 mg  200 mg Oral BID Georgette Shell, MD  200 mg at 07/28/19 0854  . enoxaparin (LOVENOX) injection 40 mg  40 mg Subcutaneous Q24H Ainsley Spinner, PA-C   40 mg at 07/28/19 0855  . hydrALAZINE (APRESOLINE) injection 5 mg  5 mg Intravenous Q6H PRN Ainsley Spinner, PA-C   5 mg at 07/26/19 2037  . levothyroxine (SYNTHROID) tablet 100 mcg  100 mcg Oral QAC breakfast Ainsley Spinner, PA-C   100 mcg at 07/28/19 0555  . LORazepam (ATIVAN) tablet 0.25-5 mg  0.25-5 mg Oral BID PRN Ainsley Spinner, PA-C   0.5 mg at 07/26/19 2037  . methocarbamol (ROBAXIN) 500 mg in dextrose 5 % 50 mL IVPB  500 mg Intravenous Q6H PRN Ainsley Spinner, PA-C      . metoCLOPramide (REGLAN) tablet 5-10 mg  5-10 mg Oral Q8H PRN Ainsley Spinner, PA-C       Or  .  metoCLOPramide (REGLAN) injection 5-10 mg  5-10 mg Intravenous Q8H PRN Ainsley Spinner, PA-C      . morphine 2 MG/ML injection 2 mg  2 mg Intravenous Q3H PRN Ainsley Spinner, PA-C   2 mg at 07/26/19 2327  . ondansetron (ZOFRAN) tablet 4 mg  4 mg Oral Q6H PRN Ainsley Spinner, PA-C       Or  . ondansetron [facility]) injection 4 mg  4 mg Intravenous Q6H PRN Ainsley Spinner, PA-C      . oxyCODONE (Oxy IR/ROXICODONE) immediate release tablet 5-10 mg  5-10 mg Oral Q4H PRN Ainsley Spinner, PA-C   5 mg at 07/28/19 0820  . pantoprazole (PROTONIX) EC tablet 40 mg  40 mg Oral Daily Ainsley Spinner, PA-C   40 mg at 07/28/19 0854  . polyethylene glycol (MIRALAX / GLYCOLAX) packet 17 g  17 g Oral BID Georgette Shell, MD   17 g at 07/28/19 [phone]  . predniSONE (DELTASONE) tablet 5 mg  5 mg Oral Daily Ainsley Spinner, PA-C   5 mg at 07/28/19 0854  . senna-docusate (Senokot-S) tablet 1 tablet  1 tablet Oral QHS PRN Ainsley Spinner, PA-C         Discharge Medications: Please see discharge summary for a list of discharge medications.  Relevant Imaging Results:  Relevant Lab Results:   Additional Information SS#:[id number]  Sharin Mons, RN

## 2019-07-28 NOTE — Plan of Care (Signed)

## 2019-07-29 DIAGNOSIS — E039 Hypothyroidism, unspecified: Secondary | ICD-10-CM

## 2019-07-29 DIAGNOSIS — Z96649 Presence of unspecified artificial hip joint: Secondary | ICD-10-CM

## 2019-07-29 DIAGNOSIS — K2100 Gastro-esophageal reflux disease with esophagitis, without bleeding: Secondary | ICD-10-CM

## 2019-07-29 LAB — CBC WITH DIFFERENTIAL/PLATELET
Abs Immature Granulocytes: 0.05 10*3/uL (ref 0.00–0.07)
Basophils Absolute: 0 10*3/uL (ref 0.0–0.1)
Basophils Relative: 1 %
Eosinophils Absolute: 0 10*3/uL (ref 0.0–0.5)
Eosinophils Relative: 0 %
HCT: 29.5 % — ABNORMAL LOW (ref 36.0–46.0)
Hemoglobin: 8.8 g/dL — ABNORMAL LOW (ref 12.0–15.0)
Immature Granulocytes: 1 %
Lymphocytes Relative: 21 %
Lymphs Abs: 1.5 10*3/uL (ref 0.7–4.0)
MCH: 27.8 pg (ref 26.0–34.0)
MCHC: 29.8 g/dL — ABNORMAL LOW (ref 30.0–36.0)
MCV: 93.1 fL (ref 80.0–100.0)
Monocytes Absolute: 0.8 10*3/uL (ref 0.1–1.0)
Monocytes Relative: 12 %
Neutro Abs: 4.6 10*3/uL (ref 1.7–7.7)
Neutrophils Relative %: 65 %
Platelets: 317 10*3/uL (ref 150–400)
RBC: 3.17 MIL/uL — ABNORMAL LOW (ref 3.87–5.11)
RDW: 19 % — ABNORMAL HIGH (ref 11.5–15.5)
WBC: 7 10*3/uL (ref 4.0–10.5)
nRBC: 0 % (ref 0.0–0.2)

## 2019-07-29 LAB — COMPREHENSIVE METABOLIC PANEL
ALT: 13 U/L (ref 0–44)
AST: 35 U/L (ref 15–41)
Albumin: 3.1 g/dL — ABNORMAL LOW (ref 3.5–5.0)
Alkaline Phosphatase: 53 U/L (ref 38–126)
Anion gap: 8 (ref 5–15)
BUN: 10 mg/dL (ref 8–23)
CO2: 24 mmol/L (ref 22–32)
Calcium: 8.1 mg/dL — ABNORMAL LOW (ref 8.9–10.3)
Chloride: 109 mmol/L (ref 98–111)
Creatinine, Ser: 0.8 mg/dL (ref 0.44–1.00)
GFR calc Af Amer: >60 mL/min (ref >60)
GFR calc non Af Amer: >60 mL/min (ref >60)
Glucose, Bld: 123 mg/dL — ABNORMAL HIGH (ref 70–99)
Potassium: 3.5 mmol/L (ref 3.5–5.1)
Sodium: 141 mmol/L (ref 135–145)
Total Bilirubin: 0.2 mg/dL — ABNORMAL LOW (ref 0.3–1.2)
Total Protein: 5.7 g/dL — ABNORMAL LOW (ref 6.5–8.1)

## 2019-07-29 LAB — LIPID PANEL
Cholesterol: 169 mg/dL (ref 0–200)
HDL: 47 mg/dL (ref >40)
LDL Cholesterol: 104 mg/dL — ABNORMAL HIGH (ref 0–99)
Total CHOL/HDL Ratio: 3.6 RATIO
Triglycerides: 92 mg/dL (ref <150)
VLDL: 18 mg/dL (ref 0–40)

## 2019-07-29 LAB — SARS CORONAVIRUS 2 (TAT 6-24 HRS): SARS Coronavirus 2: NEGATIVE

## 2019-07-29 LAB — MAGNESIUM: Magnesium: 2 mg/dL (ref 1.7–2.4)

## 2019-07-29 LAB — PHOSPHORUS: Phosphorus: 2.4 mg/dL — ABNORMAL LOW (ref 2.5–4.6)

## 2019-07-29 MED ORDER — DENOSUMAB 60 MG/ML ~~LOC~~ SOLN: 60.0000 mg | SUBCUTANEOUS | Status: DC

## 2019-07-29 MED ORDER — METOCLOPRAMIDE HCL 5 MG PO TABS: 5.0000 mg | ORAL_TABLET | Freq: Three times a day (TID) | ORAL | 0 refills | Status: DC | PRN

## 2019-07-29 MED ORDER — DOCUSATE SODIUM 100 MG PO CAPS: 200.0000 mg | ORAL_CAPSULE | Freq: Two times a day (BID) | ORAL | 0 refills | Status: DC

## 2019-07-29 MED ORDER — BISACODYL 5 MG PO TBEC: 5.0000 mg | DELAYED_RELEASE_TABLET | Freq: Every day | ORAL | 0 refills | Status: AC | PRN

## 2019-07-29 MED ORDER — ACETAMINOPHEN 325 MG PO TABS: 650.0000 mg | ORAL_TABLET | Freq: Three times a day (TID) | ORAL | 0 refills | Status: AC

## 2019-07-29 MED ORDER — FOLIC ACID 1 MG PO TABS
1.0000 mg | ORAL_TABLET | Freq: Every day | ORAL | Status: DC
  Administered 2019-07-30: 1 mg via ORAL
  Filled 2019-07-29: qty 1

## 2019-07-29 MED ORDER — VITAMIN D 1000 UNITS PO TABS: 2000.0000 [IU] | ORAL_TABLET | Freq: Two times a day (BID) | ORAL | 3 refills | Status: AC

## 2019-07-29 MED ORDER — FENTANYL 50 MCG/HR TD PT72: 1.0000 | MEDICATED_PATCH | TRANSDERMAL | 0 refills | Status: DC

## 2019-07-29 MED ORDER — ASPIRIN EC 325 MG PO TBEC
325.0000 mg | DELAYED_RELEASE_TABLET | Freq: Every day | ORAL | 0 refills | Status: DC
Start: 2019-07-29 — End: 2021-01-16

## 2019-07-29 MED ORDER — METHOCARBAMOL 500 MG PO TABS: 500.0000 mg | ORAL_TABLET | Freq: Four times a day (QID) | ORAL | Status: DC | PRN

## 2019-07-29 MED ORDER — ASCORBIC ACID 500 MG PO TABS
500.0000 mg | ORAL_TABLET | Freq: Every day | ORAL | Status: DC
  Administered 2019-07-30: 500 mg via ORAL
  Filled 2019-07-29: qty 1

## 2019-07-29 MED ORDER — ASCORBIC ACID 500 MG PO TABS: 500.0000 mg | ORAL_TABLET | Freq: Every day | ORAL | Status: DC

## 2019-07-29 MED ORDER — METHOCARBAMOL 500 MG PO TABS: 500.0000 mg | ORAL_TABLET | Freq: Four times a day (QID) | ORAL | 0 refills | Status: DC | PRN

## 2019-07-29 MED ORDER — LORAZEPAM 0.5 MG PO TABS: 0.2500 mg | ORAL_TABLET | Freq: Two times a day (BID) | ORAL | Status: DC | PRN

## 2019-07-29 MED ORDER — ASCORBIC ACID 500 MG PO TABS: 500.0000 mg | ORAL_TABLET | Freq: Every day | ORAL | 0 refills | Status: DC

## 2019-07-29 MED ORDER — FENTANYL 50 MCG/HR TD PT72
1.0000 | MEDICATED_PATCH | TRANSDERMAL | Status: DC
  Administered 2019-07-29: 1 via TRANSDERMAL
  Filled 2019-07-29: qty 1

## 2019-07-29 MED ORDER — OXYCODONE HCL 5 MG PO TABS: 5.0000 mg | ORAL_TABLET | ORAL | 0 refills | Status: DC | PRN

## 2019-07-29 MED ORDER — ONDANSETRON HCL 4 MG PO TABS: 4.0000 mg | ORAL_TABLET | Freq: Four times a day (QID) | ORAL | 0 refills | Status: DC | PRN

## 2019-07-29 MED ORDER — OMEGA-3-ACID ETHYL ESTERS 1 G PO CAPS: 1.0000 g | ORAL_CAPSULE | Freq: Every day | ORAL | 0 refills | Status: DC

## 2019-07-29 NOTE — Discharge Summary (Addendum)
Physician Discharge Summary  Jocelyn Bauer ALP:[id number] DOB: [birth year] DOA: 07/26/2019  PCP: [name], MD  Admit date: 07/26/2019 Discharge date: 07/30/2019  Time spent: 30 minutes  Recommendations for Outpatient Follow-up:  Covid vaccination; negative vaccination  Bbilateral ankle fracture  -7/20 s/p ORIF  -Pain control with Tylenol, morphine, Robaxin  Essential HTN -Amlodipine 5 mg daily -Benazepril 20 mg daily  HLD -Lipid panel pending -Omega-3 1 g daily  Hypothyroidism -Synthroid 100 mcg daily   Anxiety disorder -Lorazepam  0.25 mg BID PRN   Rheumatoid arthritis -Methotrexate 10 mg weekly -Prednisone 5 mg daily  Osteoporosis? -Have not located DEXA scan to verify this diagnosis.  Hypokalemia -K-Dur 50 meq prior to D/C  Chronic pain syndrome -Controlled on current pain medication   Discharge Diagnoses:  Active Problems:   S/P revision of left total hip   Presence of right artificial elbow joint   Hypertension   Hypothyroidism   Hyperlipidemia   GERD (gastroesophageal reflux disease)   Anxiety   Osteoporosis   Chronic neck and back pain   Bilateral ankle fractures   Chronic, continuous use of opioids   Discharge Condition: Stable  Diet recommendation: Regular  Filed Weights   07/26/19 2225  Weight: 75.2 kg    History of present illness:  Jocelyn Bauer PMHx Anxiety, HTN, HLD, GERD, hypothyroidism, osteoporosis/chronic neck and back pain/ arthritis/RA,Hx ofevision of left total hip/right artificial elbow joint   Comes to the ED after a fall at home after returning from her mailbox and complaining of pain in both of her ankles. Was complaining of pain in the left side of the chest as well after the falland reports as a gas pain and reportsshe gets this intermittently at other x2.Patient's daughter at the bedside assisting with the history. Patient has limited functional ability due to her chronic pain arthritis, lives with her  daughter who helps with most of her history of but when she walks around the house she denies any chest pain shortness of breath, no known history of CAD, CKD, CVA.She had her last surgery 5 years ago.Patient denies any nausea, vomiting, shortness of breath, fever, chills, diarrhea, focal weakness.  Hospital Course:  See above   Procedures: 7/20 bilateral ankle  ORIF    Consultations: Orthopedic surgery  Cultures  7/19 SARS coronavirus negative 7/22 SARS coronavirus negativeAntibiotics    Discharge Exam: Vitals:   07/28/19 1927 07/29/19 0331 07/29/19 0744 07/29/19 1527  BP: (!) 124/52 (!) 148/68 (!) 147/59 (!) 155/76  Pulse: 76 78 72 78  Resp: 17 17 18 16   Temp: 98.7 F (37.1 C) 98.6 F (37 C) 98.3 F (36.8 C) 98.7 F (37.1 C)  TempSrc: Oral Oral Oral Oral  SpO2: 96% 97% 95% 97%  Weight:      Height:        General: A/O x4, No acute respiratory distress Eyes: negative scleral hemorrhage, negative anisocoria, negative icterus ENT: Negative Runny nose, negative gingival bleeding, Neck:  Negative scars, masses, torticollis, lymphadenopathy, JVD Lungs: Clear to auscultation bilaterally without wheezes or crackles Cardiovascular: Regular rate and rhythm without murmur gallop or rub normal S1 and S2   Discharge Instructions   Allergies as of 07/29/2019      Reactions   Contrast Media [iodinated Diagnostic Agents] Other (See Comments)   Itching.( 13 hr pre medication regimen worked fine. )   Tdap [tetanus-diphth-acell Pertussis] Hives   Iodine    Streptococcus (diplococcus) Pneumoniae [streptococci] Other (See Comments)   Shortness of breath  Medication List    STOP taking these medications   denosumab 60 MG/ML Soln injection Commonly known as: PROLIA   fexofenadine 180 MG tablet Commonly known as: ALLEGRA   furosemide 20 MG tablet Commonly known as: LASIX   oxyCODONE-acetaminophen 10-325 MG tablet Commonly known as: PERCOCET     TAKE these  medications   acetaminophen 325 MG tablet Commonly known as: TYLENOL Take 2 tablets (650 mg total) by mouth 3 (three) times daily.   amLODipine 5 MG tablet Commonly known as: NORVASC Take 5 mg by mouth every morning.   ascorbic acid 500 MG tablet Commonly known as: VITAMIN C Take 1 tablet (500 mg total) by mouth daily.   aspirin EC 325 MG tablet Take 1 tablet (325 mg total) by mouth daily.   benazepril 20 MG tablet Commonly known as: LOTENSIN Take 20 mg by mouth every morning.   bisacodyl 5 MG EC tablet Commonly known as: DULCOLAX Take 1 tablet (5 mg total) by mouth daily as needed for moderate constipation.   cholecalciferol 1000 units tablet Commonly known as: VITAMIN D Take 2 tablets (2,000 Units total) by mouth in the morning and at bedtime. What changed: when to take this   docusate sodium 100 MG capsule Commonly known as: COLACE Take 2 capsules (200 mg total) by mouth 2 (two) times daily.   fentaNYL 50 MCG/HR Commonly known as: [place] 1 patch onto the skin every 3 (three) days. What changed: how much to take   Fish Oil 1000 MG Caps Take 1 capsule by mouth daily.   folic acid 1 MG tablet Commonly known as: FOLVITE Take 1 mg by mouth daily.   levothyroxine 100 MCG tablet Commonly known as: SYNTHROID Take 100 mcg by mouth daily before breakfast.   LORazepam 0.5 MG tablet Commonly known as: ATIVAN Take 0.25 mg by mouth 2 (two) times daily as needed for anxiety.   methocarbamol 500 MG tablet Commonly known as: ROBAXIN Take 1 tablet (500 mg total) by mouth every 6 (six) hours as needed for muscle spasms.   methotrexate 10 MG tablet Commonly known as: RHEUMATREX Take 10 mg by mouth once a week.   metoCLOPramide 5 MG tablet Commonly known as: REGLAN Take 1-2 tablets (5-10 mg total) by mouth every 8 (eight) hours as needed for nausea (if ondansetron (ZOFRAN) ineffective.).   omega-3 acid ethyl esters 1 g capsule Commonly known as: LOVAZA Take 1  capsule (1 g total) by mouth daily. Start taking on: July 30, 2019   omeprazole 20 MG capsule Commonly known as: PRILOSEC Take 20 mg by mouth daily.   ondansetron 4 MG tablet Commonly known as: ZOFRAN Take 1 tablet (4 mg total) by mouth every 6 (six) hours as needed for nausea.   oxyCODONE 5 MG immediate release tablet Commonly known as: Oxy IR/ROXICODONE Take 1-2 tablets (5-10 mg total) by mouth every 4 (four) hours as needed for moderate pain or severe pain.   predniSONE 5 MG tablet Commonly known as: DELTASONE Take 1 tablet (5 mg total) by mouth daily.      Allergies  Allergen Reactions   Contrast Media [Iodinated Diagnostic Agents] Other (See Comments)    Itching.( 13 hr pre medication regimen worked fine. )   Tdap [Tetanus-Diphth-Acell Pertussis] Hives   Iodine    Streptococcus (Diplococcus) Pneumoniae [Streptococci] Other (See Comments)    Shortness of breath    Contact information for follow-up providers    Altamese Indianola, MD. Schedule an appointment as soon as  possible for a visit in 2 week(s).   Specialty: Orthopedic Surgery Contact information: [place] [address] [phone]            Contact information for after-discharge care    Destination    [facility] .   Service: Skilled Nursing Contact information: [address] [place] [phone]                   The results of significant diagnostics from this hospitalization (including imaging, microbiology, ancillary and laboratory) are listed below for reference.    Significant Diagnostic Studies: DG Ribs Unilateral W/Chest Left  Result Date: 07/26/2019 CLINICAL DATA:  Fall, bilateral ankle fracture, chest pain EXAM: LEFT RIBS AND CHEST - 3+ VIEW COMPARISON:  None. FINDINGS: No fracture or other bone lesions are seen involving the ribs. There is no evidence of pneumothorax or pleural  effusion. Both lungs are clear. Heart size and mediastinal contours are within normal limits. Bilateral elbow arthroplasty is incidentally noted. IMPRESSION: Negative. Electronically Signed   By: Fidela Salisbury MD   On: 07/26/2019 17:21   DG Pelvis 1-2 Views  Result Date: 07/26/2019 CLINICAL DATA:  Fall, bilateral ankle fracture EXAM: PELVIS - 1-2 VIEW COMPARISON:  03/11/2011 FINDINGS: The osseous structures are mildly osteopenic. Surgical changes of left total hip arthroplasty are identified. Arthroplasty components overlie the expected position. No fracture or dislocation identified. Limited evaluation of the right hip is unremarkable. Heterotopic ossification surrounds the left hip. Vascular calcifications are seen within the pelvis. IMPRESSION: No acute fracture or dislocation. Electronically Signed   By: Fidela Salisbury MD   On: 07/26/2019 17:20   DG Elbow 2 Views Right  Result Date: 07/26/2019 CLINICAL DATA:  Pain status post fall EXAM: RIGHT ELBOW - 2 VIEW COMPARISON:  None. FINDINGS: The patient has undergone prior elbow prosthesis placement. No prior studies available for comparison. The hardware appears grossly intact. There appears to be extensive heterotopic ossification about the elbow. There is surrounding soft tissue swelling. There is likely a joint effusion with intra-articular debris. There is no evidence for a periprosthetic fracture. IMPRESSION: 1. Evaluation is limited by lack of a prior comparison. 2. There is a right elbow prosthesis that appears grossly intact where visualized. 3. No definite evidence for an acute displaced fracture. 4. There is extensive surrounding heterotopic ossification with a probable large joint effusion with intra-articular debris. Comparison to an outside prior study would be useful. Electronically Signed   By: Constance Holster M.D.   On: 07/26/2019 19:55   DG Ankle 2 Views Right  Result Date: 07/26/2019 CLINICAL DATA:  Post reduction of the right ankle  EXAM: RIGHT ANKLE - 2 VIEW COMPARISON:  Radiograph 07/26/2019 FINDINGS: Post closed reduction and splinting of the previously seen trimalleolar fracture of the right ankle including an oblique trans syndesmotic distal fibular fracture, transversely oriented medial malleolar fracture and posterior malleolar fracture which are in grossly improved alignment from comparison. Some persistent asymmetric widening of the medial clear space suggestive of some residual talar shift in the setting of this unstable ankle injury. Extensive circumferential soft tissue swelling of the ankle and ankle joint effusion are better visualized on the un splinted images. Severe talonavicular arthrosis is similar to prior. Bidirectional calcaneal spurs are again seen. Midfoot and hindfoot alignment is grossly preserved though incompletely assessed on nondedicated, nonweightbearing films. IMPRESSION: 1. Post closed reduction and splinting of trimalleolar fracture of the right ankle,  in grossly improved alignment from comparison albeit with some residual mild fracture displacement and lateral talar shift. 2. Additional swelling and ankle effusion, better seen on prior. 3. Fracture pattern compatible with a Weber B stage IV unstable ankle injury. Electronically Signed   By: Lovena Le M.D.   On: 07/26/2019 18:39   DG Ankle Complete Left  Result Date: 07/27/2019 CLINICAL DATA:  ORIF BILATERAL ankle fractures EXAM: LEFT ANKLE COMPLETE - 3+ VIEW COMPARISON:  07/26/2019 FLUOROSCOPY TIME:  0 minutes 35 seconds Dose: 0.94 mGy Images: 3 FINDINGS: Osseous demineralization. Two cannulated screws placed across a reduced medial malleolar fracture. Lateral plate and screws placed across a reduced oblique distal LEFT fibular fracture. Two K-wires been placed across the calcaneus, talus into distal tibia. Minimal narrowing of ankle joint. IMPRESSION: Post bimalleolar fracture LEFT ankle. Electronically Signed   By: Lavonia Dana M.D.   On: 07/27/2019  14:17   DG Ankle Complete Left  Result Date: 07/26/2019 CLINICAL DATA:  Fall, left ankle tenderness EXAM: LEFT ANKLE COMPLETE - 3+ VIEW COMPARISON:  None. FINDINGS: Three view radiograph of left ankle excludes the a posterior calcaneus on lateral examination. Bimalleolar fracture of the left ankle is identified. Complex fracture plane involving the medial malleolus is identified with a transverse fracture plane noted posteriorly, with a more oblique, vertically oriented component anteriorly with resultant mild displacement and minimal medial angulation of the medial malleolar fracture fragment. Oblique fracture of the distal fibular metaphysis is also identified with the fracture plane extending to the tibial plafond with minimal lateral angulation of the distal fracture fragment. Extensive soft tissue swelling superficial to the a lateral malleolus. No other fracture identified. IMPRESSION: Left ankle bimalleolar fracture with complex fracture plane demonstrating both transverse and vertically oriented oblique fracture planes involving the medial malleolus. Electronically Signed   By: Fidela Salisbury MD   On: 07/26/2019 17:18   DG Ankle Complete Right  Result Date: 07/27/2019 CLINICAL DATA:  ORIF BILATERAL ankle fractures EXAM: DG C-ARM 1-60 MIN; RIGHT ANKLE - COMPLETE 3+ VIEW FLUOROSCOPY TIME:  Fluoroscopy Time:  1 minutes 4 seconds Radiation Exposure Index (if provided by the fluoroscopic device): 1.83 mGy Number of Acquired Spot Images: 3 COMPARISON:  01/26/2019 FINDINGS: Two cannulated screws placed across reduced medial malleolar fracture. Lateral plate and multiple screws placed across a reduced distal RIGHT fibular fracture. Ankle joint alignment normal. Two K-wires traverse the calcaneus, talus and distal tibia. Diffuse osseous demineralization with minimal narrowing of ankle joint. IMPRESSION: Post bimalleolar ORIF RIGHT ankle as above. Electronically Signed   By: Lavonia Dana M.D.   On: 07/27/2019  14:16   DG Ankle Complete Right  Result Date: 07/26/2019 CLINICAL DATA:  Fall, right ankle tenderness EXAM: RIGHT ANKLE - COMPLETE 3+ VIEW COMPARISON:  None. FINDINGS: Three view radiograph of the right ankle excludes the posterior aspect of the right heel on lateral examination. These images demonstrate a trimalleolar fracture of the right ankle. Oblique fracture of the a distal right fibular metaphysis at the level of the tibial plafond with 1/2 shaft with lateral displacement and approximately 7-8 mm override and approximately 25 mm posterolateral angulation of the distal fracture fragment. Transverse fracture of the medial malleolus with minimal lateral angulation approximately 1 cm lateral displacement of the distal fracture fragment Minimally displaced fracture of the posterior malleolus with minimal posterolateral displacement of the fracture fragment. Resultant 1 cm lateral subluxation of the talar dome in relation to the tibial plafond. Superimposed severe talonavicular degenerative arthritis. Extensive bimalleolar soft  tissue swelling. IMPRESSION: Trimalleolar fracture subluxation of the right ankle as described above. Electronically Signed   By: Fidela Salisbury MD   On: 07/26/2019 17:14   CT Head Wo Contrast  Result Date: 07/26/2019 CLINICAL DATA:  Head trauma, head injury following fall EXAM: CT HEAD WITHOUT CONTRAST TECHNIQUE: Contiguous axial images were obtained from the base of the skull through the vertex without intravenous contrast. COMPARISON:  09/23/2018 FINDINGS: Brain: Normal anatomic configuration. Moderate periventricular white matter changes are present likely reflecting the sequela of small vessel ischemia. Remote lacunar infarct within the anterior limb of the right internal capsule is again identified. No abnormal intra or extra-axial mass lesion or fluid collection. No abnormal mass effect or midline shift. No evidence of acute intracranial hemorrhage or infarct. Ventricular size  is normal. Cerebellum unremarkable. Vascular: Extensive vascular calcifications are again seen within the distal left vertebral artery and proximal basilar artery. No hyperdense vasculature at the skull base. Skull: Intact Sinuses/Orbits: Paranasal sinuses are clear. Orbits are unremarkable. Other: Mastoid air cells and middle ear cavities are clear. IMPRESSION: No acute intracranial injury.  No calvarial fracture. Electronically Signed   By: Fidela Salisbury MD   On: 07/26/2019 17:09   DG Ankle Left Port  Result Date: 07/27/2019 CLINICAL DATA:  Post bimalleolar ORIF LEFT ankle EXAM: PORTABLE LEFT ANKLE - 2 VIEW COMPARISON:  Portable exam 1315 hours compared to earlier intraoperative images FINDINGS: Plaster cast material obscures bone detail. K-wires from plantar broach traverse the calcaneus, talus and into the distal tibia. Lateral plate and multiple screws at distal fibula post ORIF. Two cannulated screws at medial malleolus post ORIF. No additional fracture dislocation. IMPRESSION: Post bimalleolar ORIF LEFT ankle. Electronically Signed   By: Lavonia Dana M.D.   On: 07/27/2019 14:19   DG Ankle Right Port  Result Date: 07/27/2019 CLINICAL DATA:  Post ORIF RIGHT ankle EXAM: PORTABLE RIGHT ANKLE - 2 VIEW COMPARISON:  Portable exam 1319 hours compared to earlier intraoperative images FINDINGS: Plaster cast material obscures bone detail. Diffuse osseous demineralization. Two cannulated screws at medial malleolus post ORIF. Lateral plate and screws at distal fibula post ORIF. Two K-wires from plantar approach traverse the calcaneus, talus and into distal tibia. No additional fracture or dislocation seen. Advanced degenerative changes of the talonavicular joint. IMPRESSION: Post bimalleolar ORIF as above. Electronically Signed   By: Lavonia Dana M.D.   On: 07/27/2019 14:22   DG C-Arm 1-60 Min  Result Date: 07/27/2019 CLINICAL DATA:  ORIF BILATERAL ankle fractures EXAM: DG C-ARM 1-60 MIN; RIGHT ANKLE - COMPLETE  3+ VIEW FLUOROSCOPY TIME:  Fluoroscopy Time:  1 minutes 4 seconds Radiation Exposure Index (if provided by the fluoroscopic device): 1.83 mGy Number of Acquired Spot Images: 3 COMPARISON:  01/26/2019 FINDINGS: Two cannulated screws placed across reduced medial malleolar fracture. Lateral plate and multiple screws placed across a reduced distal RIGHT fibular fracture. Ankle joint alignment normal. Two K-wires traverse the calcaneus, talus and distal tibia. Diffuse osseous demineralization with minimal narrowing of ankle joint. IMPRESSION: Post bimalleolar ORIF RIGHT ankle as above. Electronically Signed   By: Lavonia Dana M.D.   On: 07/27/2019 14:16    Microbiology: Recent Results (from the past 240 hour(s))  SARS Coronavirus 2 by RT PCR (hospital order, performed in [facility] hospital lab) Nasopharyngeal Nasopharyngeal Swab     Status: None   Collection Time: 07/26/19  5:10 PM   Specimen: Nasopharyngeal Swab  Result Value Ref Range Status   SARS Coronavirus 2 NEGATIVE NEGATIVE Final  Comment: (NOTE) SARS-CoV-2 target nucleic acids are NOT DETECTED.  The SARS-CoV-2 RNA is generally detectable in upper and lower respiratory specimens during the acute phase of infection. The lowest concentration of SARS-CoV-2 viral copies this assay can detect is 250 copies / mL. A negative result does not preclude SARS-CoV-2 infection and should not be used as the sole basis for treatment or other patient management decisions.  A negative result may occur with improper specimen collection / handling, submission of specimen other than nasopharyngeal swab, presence of viral mutation(s) within the areas targeted by this assay, and inadequate number of viral copies (<250 copies / mL). A negative result must be combined with clinical observations, patient history, and epidemiological information.  Fact Sheet for Patients:   StrictlyIdeas.no  Fact Sheet for Healthcare  Providers: BankingDealers.co.za  This test is not yet approved or  cleared by the [place] FDA and has been authorized for detection and/or diagnosis of SARS-CoV-2 by FDA under an Emergency Use Authorization (EUA).  This EUA will remain in effect (meaning this test can be used) for the duration of the COVID-19 declaration under Section 564(b)(1) of the Act, 21 U.S.C. section 360bbb-3(b)(1), unless the authorization is terminated or revoked sooner.  Performed at [facility], [place] [address], [place], [place] [address]   Surgical pcr screen     Status: None   Collection Time: 07/27/19  1:15 AM   Specimen: Nasal Mucosa; Nasal Swab  Result Value Ref Range Status   MRSA, PCR NEGATIVE NEGATIVE Final   Staphylococcus aureus NEGATIVE NEGATIVE Final    Comment: (NOTE) The Xpert SA Assay (FDA approved for NASAL specimens in patients 11 years of age and older), is one component of a comprehensive surveillance program. It is not intended to diagnose infection nor to guide or monitor treatment. Performed at [facility], [place] [address], [place], [place] [address]   SARS CORONAVIRUS 2 (TAT 6-24 HRS) Nasopharyngeal Nasopharyngeal Swab     Status: None   Collection Time: 07/29/19  9:08 AM   Specimen: Nasopharyngeal Swab  Result Value Ref Range Status   SARS Coronavirus 2 NEGATIVE NEGATIVE Final    Comment: (NOTE) SARS-CoV-2 target nucleic acids are NOT DETECTED.  The SARS-CoV-2 RNA is generally detectable in upper and lower respiratory specimens during the acute phase of infection. Negative results do not preclude SARS-CoV-2 infection, do not rule out co-infections with other pathogens, and should not be used as the sole basis for treatment or other patient management decisions. Negative results must be combined with clinical observations, patient history, and epidemiological information. The expected result is Negative.  Fact Sheet for  Patients: SugarRoll.be  Fact Sheet for Healthcare Providers: https://www.-mathews.com/  This test is not yet approved or cleared by the [place] FDA and  has been authorized for detection and/or diagnosis of SARS-CoV-2 by FDA under an Emergency Use Authorization (EUA). This EUA will remain  in effect (meaning this test can be used) for the duration of the COVID-19 declaration under Se ction 564(b)(1) of the Act, 21 U.S.C. section 360bbb-3(b)(1), unless the authorization is terminated or revoked sooner.  Performed at [facility], [place] [address], [place],  [address]      Labs: Basic Metabolic Panel: Recent Labs  Lab 07/26/19 1531 07/27/19 1412 07/27/19 1849 07/28/19 0247 07/29/19 0149  NA 140  --  139 139 141  K 3.1*  --  3.3* 3.8 3.5  CL 100  --  105 106 109  CO2 28  --  23 24 24   GLUCOSE 115*  --  197* 145* 123*  BUN 13  --  7* 9 10  CREATININE 0.79 0.75 0.79 0.77 0.80  CALCIUM 9.1  --  8.7* 8.4* 8.1*  MG  --   --  1.7  --  2.0  PHOS  --   --   --   --  2.4*   Liver Function Tests: Recent Labs  Lab 07/27/19 1849 07/28/19 0247 07/29/19 0149  AST 27 24 35  ALT 12 9 13   ALKPHOS 63 55 53  BILITOT 0.8 0.7 0.2*  PROT 6.2* 5.8* 5.7*  ALBUMIN 3.4* 3.1* 3.1*   No results for input(s): LIPASE, AMYLASE in the last 168 hours. No results for input(s): AMMONIA in the last 168 hours. CBC: Recent Labs  Lab 07/26/19 1531 07/27/19 1849 07/28/19 0247 07/29/19 0149  WBC 10.6* 9.1 8.8 7.0  NEUTROABS 8.9*  --   --  4.6  HGB 11.5* 10.1* 9.4* 8.8*  HCT 36.8 32.5* 30.4* 29.5*  MCV 89.8 90.8 91.3 93.1  PLT 396 333 314 317   Cardiac Enzymes: No results for input(s): CKTOTAL, CKMB, CKMBINDEX, TROPONINI in the last 168 hours. BNP: BNP (last 3 results) No results for input(s): BNP in the last 8760 hours.  ProBNP (last 3 results) No results for input(s): PROBNP in the last 8760 hours.  CBG: No results for  input(s): GLUCAP in the last 168 hours.     Signed:  Dia Crawford, MD [facility] [phone] pager

## 2019-07-29 NOTE — Progress Notes (Signed)
Orthopaedic Trauma Service Progress Note  Patient ID: MILLER EDGINGTON MRN: [id number] DOB/AGE: 84-Aug-1930 84 y.o.  Subjective:  Ortho issues stable No complaints this am   ROS As above  Objective:   VITALS:   Vitals:   07/28/19 1352 07/28/19 1927 07/29/19 0331 07/29/19 0744  BP: 129/64 (!) 124/52 (!) 148/68 (!) 147/59  Pulse: 79 76 78 72  Resp: 16 17 17 18   Temp: 98.4 F (36.9 C) 98.7 F (37.1 C) 98.6 F (37 C) 98.3 F (36.8 C)  TempSrc: Oral Oral Oral Oral  SpO2: 99% 96% 97% 95%  Weight:      Height:        Estimated body mass index is 28.46 kg/m as calculated from the following:   Height as of this encounter: 5\' 4"  (1.626 m).   Weight as of this encounter: 75.2 kg.   Intake/Output      07/21 0701 - 07/22 0700 07/22 0701 - 07/23 0700   P.O. 360    I.V. (mL/kg) 2335.6 (31.1)    IV Piggyback 0    Total Intake(mL/kg) 2695.6 (35.8)    Urine (mL/kg/hr) 900 (0.5)    Blood     Total Output 900    Net +1795.6         Urine Occurrence 1 x 1 x     LABS  Results for orders placed or performed during the hospital encounter of 07/26/19 (from the past 24 hour(s))  Comprehensive metabolic panel     Status: Abnormal   Collection Time: 07/29/19  1:49 AM  Result Value Ref Range   Sodium 141 135 - 145 mmol/L   Potassium 3.5 3.5 - 5.1 mmol/L   Chloride 109 98 - 111 mmol/L   CO2 24 22 - 32 mmol/L   Glucose, Bld 123 (H) 70 - 99 mg/dL   BUN 10 8 - 23 mg/dL   Creatinine, Ser 0.80 0.44 - 1.00 mg/dL   Calcium 8.1 (L) 8.9 - 10.3 mg/dL   Total Protein 5.7 (L) 6.5 - 8.1 g/dL   Albumin 3.1 (L) 3.5 - 5.0 g/dL   AST 35 15 - 41 U/L   ALT 13 0 - 44 U/L   Alkaline Phosphatase 53 38 - 126 U/L   Total Bilirubin 0.2 (L) 0.3 - 1.2 mg/dL   GFR calc non Af Amer >60 >60 mL/min   GFR calc Af Amer >60 >60 mL/min   Anion gap 8 5 - 15  Magnesium     Status: None   Collection Time: 07/29/19  1:49 AM  Result Value  Ref Range   Magnesium 2.0 1.7 - 2.4 mg/dL  Phosphorus     Status: Abnormal   Collection Time: 07/29/19  1:49 AM  Result Value Ref Range   Phosphorus 2.4 (L) 2.5 - 4.6 mg/dL  CBC with Differential/Platelet     Status: Abnormal   Collection Time: 07/29/19  1:49 AM  Result Value Ref Range   WBC 7.0 4.0 - 10.5 K/uL   RBC 3.17 (L) 3.87 - 5.11 MIL/uL   Hemoglobin 8.8 (L) 12.0 - 15.0 g/dL   HCT 29.5 (L) 36 - 46 %   MCV 93.1 80.0 - 100.0 fL   MCH 27.8 26.0 - 34.0 pg   MCHC 29.8 (L) 30.0 - 36.0 g/dL   RDW 19.0 (  H) 11.5 - 15.5 %   Platelets 317 150 - 400 K/uL   nRBC 0.0 0.0 - 0.2 %   Neutrophils Relative % 65 %   Neutro Abs 4.6 1.7 - 7.7 K/uL   Lymphocytes Relative 21 %   Lymphs Abs 1.5 0.7 - 4.0 K/uL   Monocytes Relative 12 %   Monocytes Absolute 0.8 0 - 1 K/uL   Eosinophils Relative 0 %   Eosinophils Absolute 0.0 0 - 0 K/uL   Basophils Relative 1 %   Basophils Absolute 0.0 0 - 0 K/uL   Immature Granulocytes 1 %   Abs Immature Granulocytes 0.05 0.00 - 0.07 K/uL  Lipid panel     Status: Abnormal   Collection Time: 07/29/19  1:49 AM  Result Value Ref Range   Cholesterol 169 0 - 200 mg/dL   Triglycerides 92 <150 mg/dL   HDL 47 >40 mg/dL   Total CHOL/HDL Ratio 3.6 RATIO   VLDL 18 0 - 40 mg/dL   LDL Cholesterol 104 (H) 0 - 99 mg/dL     PHYSICAL EXAM:   Gen: resting comfortably in bed, NAD, very pleasant  Lungs: unlabored Ext:       B Lower Extremities             Splints c/d/i B              Ext cool but good color distally              Swelling controlled             + DP pulses B              No pain out of proportion with passive stretching B              EHL, FHL, lesser toe motor intact B              Dystrophic toe nail B   Assessment/Plan: 2 Days Post-Op   Active Problems:   S/P revision of left total hip   Presence of right artificial elbow joint   Hypertension   Hypothyroidism   Hyperlipidemia   GERD (gastroesophageal reflux disease)   Anxiety    Osteoporosis   Chronic neck and back pain   Bilateral ankle fractures   Chronic, continuous use of opioids   Anti-infectives (From admission, onward)   Start     Dose/Rate Route Frequency Ordered Stop   07/27/19 1500  ceFAZolin (ANCEF) IVPB 1 g/50 mL premix        1 g 100 mL/hr over 30 Minutes Intravenous Every 6 hours 07/27/19 1333 07/28/19 0302   07/27/19 0822  ceFAZolin (ANCEF) 2-4 GM/100ML-% IVPB       Note to Pharmacy: Gregery Na   : cabinet override      07/27/19 0822 07/27/19 2029    .  POD/HD#: 2  86 y/o female s/p GLF with B ankle fractures    -ground level fall    - B ankle fractures s/p ORIF              NWB B LEx x 6 weeks             Splint x 2 weeks then likely conversion to B Short leg casts (SLC)             Continue with ice and elevation              Unrestricted B knee ROM  Pt not allowed to use R arm to pull self up as she has a total elbow arthroplasty                She is really bed to chair, slide or lift transfer x 6 weeks                          PT/OT evals                          SNF at dc                 - Pain management:             No further escalation in pain meds                          On duragesic patch 50 mcg/hr and percocet 10/325 q8h prn PTA                          Think pain meds and Benzos contributory to falls                Maximize nonopioid pain control    - ABL anemia/Hemodynamics             Monitor              Currently stable   - Medical issues              Per primary    - DVT/PE prophylaxis:             Lovenox while inpatient    Concerned about her fall history to do any other pharmacologic anticoagulation at dc (DOAC, coumadin, lovenox)             ASA 325 mg daily at dc    - ID:              periop abx completed    - Metabolic Bone Disease:             On prolia, known osteoporosis              Vitamin d insufficiency                          Supplement              Given  history she would likely benefit from an anabolic agent              Will need updated dexa    - Activity:             As above      -Ex-fix/Splint care:             Keep splints clean and dry              Do not remove   - Impediments to fracture healing:             Many                          Osteoporosis                         Chronic opioid use  Frequent falls   - Dispo:             continue with therapies             SNF             Ortho issues stable. [place] for SNF when bed available  Follow up with ortho in 2 weeks    Jari Pigg, PA-C [phone] (C) 07/29/2019, 11:59 AM  [facility] [place] [address] [phone] Domingo Sep (F)

## 2019-07-29 NOTE — Discharge Instructions (Signed)
Orthopaedic Trauma Service Discharge Instructions   General Discharge Instructions  Orthopaedic Injuries:  B ankle fractures   WEIGHT BEARING STATUS: nonweightbearing both legs.  No pulling up with Right arm.  Lift or slide transfers only   RANGE OF MOTION/ACTIVITY: ok to move knees as tolerated. Do not remove ankle splints   Bone health:  Continue with vitamin D.  Will need repeat DEXA scans, ok to continue prolia for now   Wound Care: keep splints clean and dry. Do not remove. Call office with questions [phone]  DVT/PE prophylaxis: aspirin 325 mg daily x 4 weeks   Diet: as you were eating previously.  Can use over the counter stool softeners and bowel preparations, such as Miralax, to help with bowel movements.  Narcotics can be constipating.  Be sure to drink plenty of fluids  PAIN MEDICATION USE AND EXPECTATIONS  You have likely been given narcotic medications to help control your pain.  After a traumatic event that results in an fracture (broken bone) with or without surgery, it is ok to use narcotic pain medications to help control one's pain.  We understand that everyone responds to pain differently and each individual patient will be evaluated on a regular basis for the continued need for narcotic medications. Ideally, narcotic medication use should last no more than 6-8 weeks (coinciding with fracture healing).   As a patient it is your responsibility as well to monitor narcotic medication use and report the amount and frequency you use these medications when you come to your office visit.   We would also advise that if you are using narcotic medications, you should take a dose prior to therapy to maximize you participation.  IF YOU ARE ON NARCOTIC MEDICATIONS IT IS NOT PERMISSIBLE TO OPERATE A MOTOR VEHICLE (MOTORCYCLE/CAR/TRUCK/MOPED) OR HEAVY MACHINERY DO NOT MIX NARCOTICS WITH OTHER CNS (CENTRAL NERVOUS SYSTEM) DEPRESSANTS SUCH AS ALCOHOL   STOP SMOKING OR USING  NICOTINE PRODUCTS!!!!  As discussed nicotine severely impairs your body's ability to heal surgical and traumatic wounds but also impairs bone healing.  Wounds and bone heal by forming microscopic blood vessels (angiogenesis) and nicotine is a vasoconstrictor (essentially, shrinks blood vessels).  Therefore, if vasoconstriction occurs to these microscopic blood vessels they essentially disappear and are unable to deliver necessary nutrients to the healing tissue.  This is one modifiable factor that you can do to dramatically increase your chances of healing your injury.    (This means no smoking, no nicotine gum, patches, etc)  DO NOT USE NONSTEROIDAL ANTI-INFLAMMATORY DRUGS (NSAID'S)  Using products such as Advil (ibuprofen), Aleve (naproxen), Motrin (ibuprofen) for additional pain control during fracture healing can delay and/or prevent the healing response.  If you would like to take over the counter (OTC) medication, Tylenol (acetaminophen) is ok.  However, some narcotic medications that are given for pain control contain acetaminophen as well. Therefore, you should not exceed more than 4000 mg of tylenol in a day if you do not have liver disease.  Also note that there are may OTC medicines, such as cold medicines and allergy medicines that my contain tylenol as well.  If you have any questions about medications and/or interactions please ask your doctor/PA or your pharmacist.      ICE AND ELEVATE INJURED/OPERATIVE EXTREMITY  Using ice and elevating the injured extremity above your heart can help with swelling and pain control.  Icing in a pulsatile fashion, such as 20 minutes on and 20 minutes off, can be followed.  Do not place ice directly on skin. Make sure there is a barrier between to skin and the ice pack.    Using frozen items such as frozen peas works well as the conform nicely to the are that needs to be iced.  USE AN ACE WRAP OR TED HOSE FOR SWELLING CONTROL  In addition to icing and  elevation, Ace wraps or TED hose are used to help limit and resolve swelling.  It is recommended to use Ace wraps or TED hose until you are informed to stop.    When using Ace Wraps start the wrapping distally (farthest away from the body) and wrap proximally (closer to the body)   Example: If you had surgery on your leg or thing and you do not have a splint on, start the ace wrap at the toes and work your way up to the thigh        If you had surgery on your upper extremity and do not have a splint on, start the ace wrap at your fingers and work your way up to the upper arm  IF YOU ARE IN A SPLINT OR CAST DO NOT [place]   If your splint gets wet for any reason please contact the office immediately. You may shower in your splint or cast as long as you keep it dry.  This can be done by wrapping in a cast cover or garbage back (or similar)  Do Not stick any thing down your splint or cast such as pencils, money, or hangers to try and scratch yourself with.  If you feel itchy take benadryl as prescribed on the bottle for itching  IF YOU ARE IN A CAM BOOT (BLACK BOOT)  You may remove boot periodically. Perform daily dressing changes as noted below.  Wash the liner of the boot regularly and wear a sock when wearing the boot. It is recommended that you sleep in the boot until told otherwise    Call office for the following:  Temperature greater than 101F  Persistent nausea and vomiting  Severe uncontrolled pain  Redness, tenderness, or signs of infection (pain, swelling, redness, odor or green/yellow discharge around the site)  Difficulty breathing, headache or visual disturbances  Hives  Persistent dizziness or light-headedness  Extreme fatigue  Any other questions or concerns you may have after discharge  In an emergency, call 911 or go to an Emergency Department at a nearby hospital  HELPFUL INFORMATION  ? If you had a block, it will wear off between 8-24 hrs postop  typically.  This is period when your pain may go from nearly zero to the pain you would have had postop without the block.  This is an abrupt transition but nothing dangerous is happening.  You may take an extra dose of narcotic when this happens.  ? You should wean off your narcotic medicines as soon as you are able.  Most patients will be off or using minimal narcotics before their first postop appointment.   ? We suggest you use the pain medication the first night prior to going to bed, in order to ease any pain when the anesthesia wears off. You should avoid taking pain medications on an empty stomach as it will make you nauseous.  ? Do not drink alcoholic beverages or take illicit drugs when taking pain medications.  ? In most states it is against the law to drive while you are in a splint or sling.  And  certainly against the law to drive while taking narcotics.  ? You may return to work/school in the next couple of days when you feel up to it.   ? Pain medication may make you constipated.  Below are a few solutions to try in this order: - Decrease the amount of pain medication if you aren't having pain. - Drink lots of decaffeinated fluids. - Drink prune juice and/or each dried prunes  o If the first 3 don't work start with additional solutions - Take Colace - an over-the-counter stool softener - Take Senokot - an over-the-counter laxative - Take Miralax - a stronger over-the-counter laxative     CALL THE OFFICE WITH ANY QUESTIONS OR CONCERNS: [phone]   VISIT OUR WEBSITE FOR ADDITIONAL INFORMATION: [email or web address]     Cast or Splint Care, Adult Casts and splints are supports that are worn to protect broken bones and other injuries. A cast or splint may hold a bone still and in the correct position while it heals. Casts and splints may also help to ease pain, swelling, and muscle spasms. How to care for your cast   Do not stick anything inside the cast to scratch your  skin.  Check the skin around the cast every day. Tell your doctor about any concerns.  You may put lotion on dry skin around the edges of the cast. Do not put lotion on the skin under the cast.  Keep the cast clean.  If the cast is not waterproof: ? Do not let it get wet. ? Cover it with a watertight covering when you take a bath or a shower. How to care for your splint   Wear it as told by your doctor. Take it off only as told by your doctor.  Loosen the splint if your fingers or toes tingle, get numb, or turn cold and blue.  Keep the splint clean.  If the splint is not waterproof: ? Do not let it get wet. ? Cover it with a watertight covering when you take a bath or a shower. Follow these instructions at home: Bathing  Do not take baths or swim until your doctor says it is okay. Ask your doctor if you can take showers. You may only be allowed to take sponge baths for bathing.  If your cast or splint is not waterproof, cover it with a watertight covering when you take a bath or shower. Managing pain, stiffness, and swelling  Move your fingers or toes often to avoid stiffness and to lessen swelling.  Raise (elevate) the injured area above the level of your heart while sitting or lying down. Safety  Do not use the injured limb to support your body weight until your doctor says that it is okay.  Use crutches or other assistive devices as told by your doctor. General instructions  Do not put pressure on any part of the cast or splint until it is fully hardened. This may take many hours.  Return to your normal activities as told by your doctor. Ask your doctor what activities are safe for you.  Keep all follow-up visits as told by your doctor. This is important. Contact a doctor if:  Your cast or splint gets damaged.  The skin around the cast gets red or raw.  The skin under the cast is very itchy or painful.  Your cast or splint feels very uncomfortable.  Your  cast or splint is too tight or too loose.  Your cast becomes wet or it  starts to have a soft spot or area.  You get an object stuck under your cast. Get help right away if:  Your pain gets worse.  The injured area tingles, gets numb, or turns blue and cold.  The part of your body above or below the cast is swollen and it turns a different color (is discolored).  You cannot feel or move your fingers or toes.  There is fluid leaking through the cast.  You have very bad pain or pressure under the cast.  You have trouble breathing.  You have shortness of breath.  You have chest pain. This information is not intended to replace advice given to you by your health care provider. Make sure you discuss any questions you have with your health care provider. Document Revised: 04/15/2018 Document Reviewed: 12/15/2015 Elsevier Patient Education  [employer].

## 2019-07-29 NOTE — Consult Note (Signed)
   [facility] Inpatient Consult   07/29/2019  Jocelyn Bauer 30-Jun-1928 [id number]   [place]  Accountable Care Organization [ACO] Patient:  Medicare [employer]   Patient screened for [place] Management service needs.  Review of patient's medical record reveals patient admitted with bilateral ankle fractures due to fall. Patient is being recommended for a skilled nursing facility stay.  Primary Care Provider is  [name], MD,  this provider is listed to provide the transition of care [TOC] for post hospital follow up.   Plan:  Patient is being recommended for a skilled nursing facility transition of care for disposition. Continue to follow progress and disposition to assess for post hospital care management needs.   For questions contact:   Natividad Brood, RN BSN [facility] Liaison  [phone] business mobile phone Toll free office [phone]  Fax number: [phone] [email or web address]

## 2019-07-30 DIAGNOSIS — Q780 Osteogenesis imperfecta: Secondary | ICD-10-CM | POA: Diagnosis not present

## 2019-07-30 DIAGNOSIS — S82891A Other fracture of right lower leg, initial encounter for closed fracture: Secondary | ICD-10-CM | POA: Diagnosis not present

## 2019-07-30 DIAGNOSIS — E039 Hypothyroidism, unspecified: Secondary | ICD-10-CM | POA: Diagnosis not present

## 2019-07-30 DIAGNOSIS — Z87891 Personal history of nicotine dependence: Secondary | ICD-10-CM | POA: Diagnosis not present

## 2019-07-30 DIAGNOSIS — J811 Chronic pulmonary edema: Secondary | ICD-10-CM | POA: Diagnosis not present

## 2019-07-30 DIAGNOSIS — D62 Acute posthemorrhagic anemia: Secondary | ICD-10-CM | POA: Diagnosis not present

## 2019-07-30 DIAGNOSIS — M545 Low back pain: Secondary | ICD-10-CM | POA: Diagnosis not present

## 2019-07-30 DIAGNOSIS — R5381 Other malaise: Secondary | ICD-10-CM | POA: Diagnosis not present

## 2019-07-30 DIAGNOSIS — E876 Hypokalemia: Secondary | ICD-10-CM | POA: Diagnosis not present

## 2019-07-30 DIAGNOSIS — Z20822 Contact with and (suspected) exposure to covid-19: Secondary | ICD-10-CM | POA: Diagnosis not present

## 2019-07-30 DIAGNOSIS — Z79899 Other long term (current) drug therapy: Secondary | ICD-10-CM | POA: Diagnosis not present

## 2019-07-30 DIAGNOSIS — I10 Essential (primary) hypertension: Secondary | ICD-10-CM | POA: Diagnosis not present

## 2019-07-30 DIAGNOSIS — I959 Hypotension, unspecified: Secondary | ICD-10-CM | POA: Diagnosis not present

## 2019-07-30 DIAGNOSIS — F419 Anxiety disorder, unspecified: Secondary | ICD-10-CM | POA: Diagnosis not present

## 2019-07-30 DIAGNOSIS — H04129 Dry eye syndrome of unspecified lacrimal gland: Secondary | ICD-10-CM | POA: Diagnosis not present

## 2019-07-30 DIAGNOSIS — R0989 Other specified symptoms and signs involving the circulatory and respiratory systems: Secondary | ICD-10-CM | POA: Diagnosis not present

## 2019-07-30 DIAGNOSIS — B351 Tinea unguium: Secondary | ICD-10-CM | POA: Diagnosis not present

## 2019-07-30 DIAGNOSIS — M1990 Unspecified osteoarthritis, unspecified site: Secondary | ICD-10-CM | POA: Diagnosis not present

## 2019-07-30 DIAGNOSIS — K5900 Constipation, unspecified: Secondary | ICD-10-CM | POA: Diagnosis not present

## 2019-07-30 DIAGNOSIS — D649 Anemia, unspecified: Secondary | ICD-10-CM | POA: Diagnosis not present

## 2019-07-30 DIAGNOSIS — S82851A Displaced trimalleolar fracture of right lower leg, initial encounter for closed fracture: Secondary | ICD-10-CM | POA: Diagnosis not present

## 2019-07-30 DIAGNOSIS — J309 Allergic rhinitis, unspecified: Secondary | ICD-10-CM | POA: Diagnosis not present

## 2019-07-30 DIAGNOSIS — M810 Age-related osteoporosis without current pathological fracture: Secondary | ICD-10-CM | POA: Diagnosis not present

## 2019-07-30 DIAGNOSIS — E785 Hyperlipidemia, unspecified: Secondary | ICD-10-CM | POA: Diagnosis not present

## 2019-07-30 DIAGNOSIS — S82851D Displaced trimalleolar fracture of right lower leg, subsequent encounter for closed fracture with routine healing: Secondary | ICD-10-CM | POA: Diagnosis not present

## 2019-07-30 DIAGNOSIS — R109 Unspecified abdominal pain: Secondary | ICD-10-CM | POA: Diagnosis not present

## 2019-07-30 DIAGNOSIS — H6123 Impacted cerumen, bilateral: Secondary | ICD-10-CM | POA: Diagnosis not present

## 2019-07-30 DIAGNOSIS — R05 Cough: Secondary | ICD-10-CM | POA: Diagnosis not present

## 2019-07-30 DIAGNOSIS — R0602 Shortness of breath: Secondary | ICD-10-CM | POA: Diagnosis not present

## 2019-07-30 DIAGNOSIS — N3281 Overactive bladder: Secondary | ICD-10-CM | POA: Diagnosis not present

## 2019-07-30 DIAGNOSIS — S82842D Displaced bimalleolar fracture of left lower leg, subsequent encounter for closed fracture with routine healing: Secondary | ICD-10-CM | POA: Diagnosis not present

## 2019-07-30 DIAGNOSIS — R41841 Cognitive communication deficit: Secondary | ICD-10-CM | POA: Diagnosis not present

## 2019-07-30 DIAGNOSIS — E559 Vitamin D deficiency, unspecified: Secondary | ICD-10-CM | POA: Diagnosis not present

## 2019-07-30 DIAGNOSIS — G894 Chronic pain syndrome: Secondary | ICD-10-CM | POA: Diagnosis not present

## 2019-07-30 DIAGNOSIS — M80872A Other osteoporosis with current pathological fracture, left ankle and foot, initial encounter for fracture: Secondary | ICD-10-CM | POA: Diagnosis not present

## 2019-07-30 DIAGNOSIS — R600 Localized edema: Secondary | ICD-10-CM | POA: Diagnosis not present

## 2019-07-30 DIAGNOSIS — R100 Acute abdomen: Secondary | ICD-10-CM | POA: Diagnosis not present

## 2019-07-30 DIAGNOSIS — R278 Other lack of coordination: Secondary | ICD-10-CM | POA: Diagnosis not present

## 2019-07-30 DIAGNOSIS — S82842A Displaced bimalleolar fracture of left lower leg, initial encounter for closed fracture: Secondary | ICD-10-CM | POA: Diagnosis not present

## 2019-07-30 DIAGNOSIS — Z96621 Presence of right artificial elbow joint: Secondary | ICD-10-CM | POA: Diagnosis not present

## 2019-07-30 DIAGNOSIS — M6281 Muscle weakness (generalized): Secondary | ICD-10-CM | POA: Diagnosis not present

## 2019-07-30 DIAGNOSIS — M069 Rheumatoid arthritis, unspecified: Secondary | ICD-10-CM | POA: Diagnosis not present

## 2019-07-30 DIAGNOSIS — Z66 Do not resuscitate: Secondary | ICD-10-CM | POA: Diagnosis not present

## 2019-07-30 DIAGNOSIS — Z96642 Presence of left artificial hip joint: Secondary | ICD-10-CM | POA: Diagnosis not present

## 2019-07-30 DIAGNOSIS — M2550 Pain in unspecified joint: Secondary | ICD-10-CM | POA: Diagnosis not present

## 2019-07-30 DIAGNOSIS — K219 Gastro-esophageal reflux disease without esophagitis: Secondary | ICD-10-CM | POA: Diagnosis not present

## 2019-07-30 DIAGNOSIS — R32 Unspecified urinary incontinence: Secondary | ICD-10-CM | POA: Diagnosis not present

## 2019-07-30 DIAGNOSIS — M542 Cervicalgia: Secondary | ICD-10-CM | POA: Diagnosis not present

## 2019-07-30 DIAGNOSIS — S82852D Displaced trimalleolar fracture of left lower leg, subsequent encounter for closed fracture with routine healing: Secondary | ICD-10-CM | POA: Diagnosis not present

## 2019-07-30 DIAGNOSIS — Z7401 Bed confinement status: Secondary | ICD-10-CM | POA: Diagnosis not present

## 2019-07-30 DIAGNOSIS — R0902 Hypoxemia: Secondary | ICD-10-CM | POA: Diagnosis not present

## 2019-07-30 LAB — COMPREHENSIVE METABOLIC PANEL
ALT: 16 U/L (ref 0–44)
AST: 29 U/L (ref 15–41)
Albumin: 3.4 g/dL — ABNORMAL LOW (ref 3.5–5.0)
Alkaline Phosphatase: 62 U/L (ref 38–126)
Anion gap: 10 (ref 5–15)
BUN: 8 mg/dL (ref 8–23)
CO2: 27 mmol/L (ref 22–32)
Calcium: 8.9 mg/dL (ref 8.9–10.3)
Chloride: 104 mmol/L (ref 98–111)
Creatinine, Ser: 0.68 mg/dL (ref 0.44–1.00)
GFR calc Af Amer: >60 mL/min (ref >60)
GFR calc non Af Amer: >60 mL/min (ref >60)
Glucose, Bld: 114 mg/dL — ABNORMAL HIGH (ref 70–99)
Potassium: 3 mmol/L — ABNORMAL LOW (ref 3.5–5.1)
Sodium: 141 mmol/L (ref 135–145)
Total Bilirubin: 0.6 mg/dL (ref 0.3–1.2)
Total Protein: 6.5 g/dL (ref 6.5–8.1)

## 2019-07-30 LAB — CBC WITH DIFFERENTIAL/PLATELET
Abs Immature Granulocytes: 0.04 10*3/uL (ref 0.00–0.07)
Basophils Absolute: 0.1 10*3/uL (ref 0.0–0.1)
Basophils Relative: 1 %
Eosinophils Absolute: 0.1 10*3/uL (ref 0.0–0.5)
Eosinophils Relative: 1 %
HCT: 33.6 % — ABNORMAL LOW (ref 36.0–46.0)
Hemoglobin: 10.3 g/dL — ABNORMAL LOW (ref 12.0–15.0)
Immature Granulocytes: 1 %
Lymphocytes Relative: 19 %
Lymphs Abs: 1.2 10*3/uL (ref 0.7–4.0)
MCH: 28.1 pg (ref 26.0–34.0)
MCHC: 30.7 g/dL (ref 30.0–36.0)
MCV: 91.6 fL (ref 80.0–100.0)
Monocytes Absolute: 0.5 10*3/uL (ref 0.1–1.0)
Monocytes Relative: 8 %
Neutro Abs: 4.4 10*3/uL (ref 1.7–7.7)
Neutrophils Relative %: 70 %
Platelets: 387 10*3/uL (ref 150–400)
RBC: 3.67 MIL/uL — ABNORMAL LOW (ref 3.87–5.11)
RDW: 18.5 % — ABNORMAL HIGH (ref 11.5–15.5)
WBC: 6.2 10*3/uL (ref 4.0–10.5)
nRBC: 0 % (ref 0.0–0.2)

## 2019-07-30 LAB — MAGNESIUM: Magnesium: 2 mg/dL (ref 1.7–2.4)

## 2019-07-30 LAB — PHOSPHORUS: Phosphorus: 3.1 mg/dL (ref 2.5–4.6)

## 2019-07-30 MED ORDER — POTASSIUM CHLORIDE CRYS ER 20 MEQ PO TBCR
50.0000 meq | EXTENDED_RELEASE_TABLET | Freq: Once | ORAL | Status: AC
  Administered 2019-07-30: 50 meq via ORAL
  Filled 2019-07-30: qty 1

## 2019-07-30 MED ORDER — POTASSIUM CHLORIDE CRYS ER 20 MEQ PO TBCR: 50.0000 meq | EXTENDED_RELEASE_TABLET | Freq: Once | ORAL | Status: DC

## 2019-07-30 NOTE — Progress Notes (Signed)
Ok to hold off denosumab while here per Dr. Sherral Hammers. Pt can get it outpt once she is discharged.  Onnie Boer, PharmD, BCIDP, AAHIVP, CPP Infectious Disease Pharmacist 07/30/2019 1:55 PM

## 2019-07-30 NOTE — Progress Notes (Signed)
Physical Therapy Treatment Patient Details Name: Jocelyn Bauer MRN: [id number] DOB: 04-Oct-1928 Today's Date: 07/30/2019    History of Present Illness Pt is an 86 y.o. female admitted 07/26/19 after fall at home and sustained L ankle bimalleolar fx with complex fx plane of medial malleolus, also R ankle trimalleolar fs/subluxation. Head CT negative for actue injury. S/p bilateral ankle ORIF 07/27/19. PMH includes osteoporosis, OA, hypothyroidism, HTN, HLD, chronic pain, R triceps tendon repair, B total elbow arthroplasties, B THAs.   PT Comments    Pt progressing well with mobility. Tolerated multiple bouts of rolling and bed mobility with modA; bowel and bladder incontinence, dependent for pericare. Transferred to recliner with maximove lift, tolerated great. Pt motivated to participate and regain PLOF. BLE therex, including knee AROM. Pt left in recliner with chair alarm activated; RN aware.   Follow Up Recommendations  SNF;Supervision/Assistance - 24 hour     Equipment Recommendations  Wheelchair (measurements PT);Wheelchair cushion (measurements PT)    Recommendations for Other Services       Precautions / Restrictions Precautions Precautions: Fall Restrictions Weight Bearing Restrictions: Yes RLE Weight Bearing: Non weight bearing LLE Weight Bearing: Non weight bearing Other Position/Activity Restrictions: Limited ROM L elbow    Mobility  Bed Mobility Overal bed mobility: Needs Assistance Bed Mobility: Rolling Rolling: Mod assist         General bed mobility comments: ModA to roll R/L multiple times for bed pan placement, pericare and maximove lift pad placement; pt assisting well with bed rails, frequent cues not to assist by pushing through bilateral feet. Able to pull up into partial long sitting with HOB elevated without assist  Transfers Overall transfer level: Needs assistance               General transfer comment: Maximove transfer from bed to recliner,  pt tolerated well and very appreciative of being OOB  Ambulation/Gait                 Stairs             Wheelchair Mobility    Modified Rankin (Stroke Patients Only)       Balance Overall balance assessment: Needs assistance Sitting-balance support: Single extremity supported Sitting balance-Leahy Scale: Poor Sitting balance - Comments: Reliant on single UE support to maintain sitting balance at edge of recliner                                    Cognition Arousal/Alertness: Awake/alert Behavior During Therapy: WFL for tasks assessed/performed Overall Cognitive Status: No family/caregiver present to determine baseline cognitive functioning Area of Impairment: Attention;Memory;Following commands;Safety/judgement;Awareness;Problem solving                   Current Attention Level: Sustained Memory: Decreased recall of precautions;Decreased short-term memory Following Commands: Follows one step commands inconsistently;Follows multi-step commands inconsistently Safety/Judgement: Decreased awareness of safety;Decreased awareness of deficits   Problem Solving: Requires verbal cues General Comments: Improving cognition and problem solving; pt motivated to participate and states "my mind hasn't been the same since this happened." Following majority of commands appropriately, although requires frequent cues for NWB precautions with bed mobility      Exercises Other Exercises Other Exercises: BLE LAQ with static hold quad set    General Comments        Pertinent Vitals/Pain Pain Assessment: Faces Faces Pain Scale: Hurts a little bit Pain Location: "It's not  even my legs, it's my whole body arthritis" Pain Descriptors / Indicators: Discomfort;Aching;Sore Pain Intervention(s): Monitored during session;Repositioned    Home Living                      Prior Function            PT Goals (current goals can now be found in the  care plan section) Acute Rehab PT Goals Patient Stated Goal: "I want to be able to sit on a toilet again" Progress towards PT goals: Progressing toward goals    Frequency    Min 3X/week      PT Plan Frequency needs to be updated    Co-evaluation              AM-PAC PT "6 Clicks" Mobility   Outcome Measure  Help needed turning from your back to your side while in a flat bed without using bedrails?: A Lot Help needed moving from lying on your back to sitting on the side of a flat bed without using bedrails?: A Lot Help needed moving to and from a bed to a chair (including a wheelchair)?: Total Help needed standing up from a chair using your arms (e.g., wheelchair or bedside chair)?: Total Help needed to walk in hospital room?: Total Help needed climbing 3-5 steps with a railing? : Total 6 Click Score: 8    End of Session   Activity Tolerance: Patient tolerated treatment well Patient left: in chair;with call bell/phone within reach;with chair alarm set Nurse Communication: Mobility status;Need for lift equipment PT Visit Diagnosis: Other abnormalities of gait and mobility (R26.89);Repeated falls (R29.6);Pain;Muscle weakness (generalized) (M62.81) Pain - part of body: Ankle and joints of foot     Time: 6770-3403 PT Time Calculation (min) (ACUTE ONLY): 39 min  Charges:  $Therapeutic Exercise: 8-22 mins $Therapeutic Activity: 23-37 mins                    Mabeline Caras, PT, DPT Acute Rehabilitation Services  Pager [phone] Office [place] 07/30/2019, 9:50 AM

## 2019-07-30 NOTE — Progress Notes (Signed)
Called facility, gave report to [place], all questions and concerns addressed.

## 2019-07-30 NOTE — TOC Transition Note (Signed)
Transition of Care [facility]) - CM/SW Discharge Note   Patient Details  Name: Jocelyn Bauer MRN: [id number] Date of Birth: [birth year]  Transition of Care [facility]) CM/SW Contact:  Gabrielle Dare Phone Number: 07/30/2019, 9:56 AM   Clinical Narrative:    Patient will Discharge To: [facility] Anticipated DC Date:07/30/19 Family Notified:yes, daughter Roanna Banning, [phone] Transport By: Corey Harold   Per MD patient ready for DC to [facility]. RN, patient, patient's family, and facility notified of DC. Assessment, Fl2/Pasrr, and Discharge Summary sent to facility. RN given number for report [phone], Room 35). DC packet on chart. Ambulance transport requested for patient for 2:00pm  CSW signing off.  Reed Breech LCSWA [phone]     Final next level of care: Skilled Nursing Facility Barriers to Discharge: No Barriers Identified   Patient Goals and CMS Choice        Discharge Placement              Patient chooses bed at: [facility] Patient to be transferred to facility by: [place] Name of family member notified: Roanna Banning Patient and family notified of of transfer: 07/30/19  Discharge Plan and Services                                     Social Determinants of Health (SDOH) Interventions     Readmission Risk Interventions No flowsheet data found.

## 2019-07-31 NOTE — Progress Notes (Signed)
Patient complains of dizziness.  Patient and family member state that this has been occurring intermittently for months before admission.  Dr.  Sherral Hammers contacted.  MD states patient may be discharged as ordered.  No further orders received at this time.

## 2019-08-02 DIAGNOSIS — G894 Chronic pain syndrome: Secondary | ICD-10-CM | POA: Diagnosis not present

## 2019-08-02 DIAGNOSIS — E785 Hyperlipidemia, unspecified: Secondary | ICD-10-CM | POA: Diagnosis not present

## 2019-08-02 DIAGNOSIS — E039 Hypothyroidism, unspecified: Secondary | ICD-10-CM | POA: Diagnosis not present

## 2019-08-02 DIAGNOSIS — I10 Essential (primary) hypertension: Secondary | ICD-10-CM | POA: Diagnosis not present

## 2019-08-05 DIAGNOSIS — S82842D Displaced bimalleolar fracture of left lower leg, subsequent encounter for closed fracture with routine healing: Secondary | ICD-10-CM | POA: Diagnosis not present

## 2019-08-05 DIAGNOSIS — I10 Essential (primary) hypertension: Secondary | ICD-10-CM | POA: Diagnosis not present

## 2019-08-05 DIAGNOSIS — E039 Hypothyroidism, unspecified: Secondary | ICD-10-CM | POA: Diagnosis not present

## 2019-08-05 DIAGNOSIS — K219 Gastro-esophageal reflux disease without esophagitis: Secondary | ICD-10-CM | POA: Diagnosis not present

## 2019-08-09 DIAGNOSIS — E876 Hypokalemia: Secondary | ICD-10-CM | POA: Diagnosis not present

## 2019-08-09 DIAGNOSIS — R100 Acute abdomen: Secondary | ICD-10-CM | POA: Diagnosis not present

## 2019-08-09 DIAGNOSIS — K5900 Constipation, unspecified: Secondary | ICD-10-CM | POA: Diagnosis not present

## 2019-08-09 DIAGNOSIS — M069 Rheumatoid arthritis, unspecified: Secondary | ICD-10-CM | POA: Diagnosis not present

## 2019-08-09 DIAGNOSIS — S82851D Displaced trimalleolar fracture of right lower leg, subsequent encounter for closed fracture with routine healing: Secondary | ICD-10-CM | POA: Diagnosis not present

## 2019-08-12 DIAGNOSIS — S82851A Displaced trimalleolar fracture of right lower leg, initial encounter for closed fracture: Secondary | ICD-10-CM

## 2019-08-12 NOTE — Op Note (Signed)
07/27/2019  9:54 AM  PATIENT:  Jocelyn Bauer  86 y.o. female  PRE-OPERATIVE DIAGNOSIS:  Bilateral Trimalleolar Ankle Fractures  POST-OPERATIVE DIAGNOSIS:  Bilateral Trimalleolar Ankle Fractures  PROCEDURE:  Procedure(s): 1. OPEN REDUCTION INTERNAL FIXATION (ORIF) RIGHT TRIMALLEOLAR ANKLE FRACTURE WITHOUT FIXATION OF POSTERIOR LIP WITH SUPPLEMENTAL PIN FIXATION 2. OPEN REDUCTION INTERNAL FIXATION (ORIF) LEFT TRIMALLEOLAR ANKLE FRACTURE WITHOUT FIXATION OF POSTERIOR LIP WITH SUPPLEMENTAL PIN FIXATION 3. STRESS FLUORO OF RIGHT ANKLE SYNDESMOSIS 4. STRESS FLUORO OF LEFT ANKLE SYNDESMOSIS  SURGEON:  Surgeon(s) and Role:    Altamese Loving, MD - Primary  PHYSICIAN ASSISTANT: Ainsley Spinner, PA-C  ANESTHESIA:   general  I/O:  No intake/output data recorded.  SPECIMEN:  No Specimen  TOURNIQUET:  * No tourniquets in log *  COMPLICATIONS: NONE  DICTATION: .Note written in EPIC  DISPOSITION: TO PACU  CONDITION: STABLE  DELAY START OF DVT PROPHYLAXIS BECAUSE OF BLEEDING RISK: NO  BRIEF SUMMARY AND INDICATIONS FOR PROCEDURE:  The patient is a 86 y.o. with multiple medical problems, severe bone fragility and hx bilateral TEA's and THA's who sustained bilateral fractures of the ankles, treated with reduction at the time of presentation to the Emergency Department, followed by splint application. I discussed with the patient the risks and benefits of surgery including the possibility of infection, DVT, PE, nerve injury, vessel injury, loss of motion, arthritis, symptomatic hardware, heart attack, stroke and need for further surgery, among others. We specifically discussed syndesmotic repair and that some of the implants used for this would likely require subsequent removal. After acknowledging these risks, consent was provided to proceed.  SUMMARY OF PROCEDURE:  The patient was taken to the operating room after administration of a regional blocks and preoperative antibiotics.  Both lower  extremities were prepped and draped in the usual sterile fashion.  We began with the right. A timeout was held, and then an incision was made directly over the lateral malleolus with careful dissection to avoid injury to the superficial peroneal nerve.  The periosteum was left intact as we continued deep dissection.  The fracture site was identified and curettage and lavage used to remove hematoma.  With the assistance of distal manipulation and placement of tenaculums, we were able to obtain an anatomic reduction with interdigitation of the primary fracture fragments.  Because of the angle and comminution of the fracture site, I was unable to place a lag screw but was able to hold this interdigitated during application of a lateral malleolus plate.  We used the Paragon system and placed standard fixation in the shaft and distal lateral malleolus, confirming plate position with x-ray and then continuing with a standard fixation as well as a locked fixation.  Final images showed appropriate reductional replacement, trajectory, and length.  Next, attention was turned to the medial side.  Here, a curvilinear incision was made to allow for distal placement of screws as well as direct access to the medial malleolus fracture site and joint.  There was a large medial malleolar fragment that did extend down to the anterior plafond.  This was debrided with curettage of the fracture edges as well as copious lavage of the joint.  I did inspect the posterior tibialis tendon, which was clearly visible and intact.  I did not identify any large fragments of articular cartilage loss off the dome of the talus.  There was no chondral injury of significance.  I then placed a drill hole in the medial metaphysis of the tibia and used a  pointed tenaculum to gain compression and reduction of the fracture site, which was also seen to interdigitate as a 15 blade was used to scratch back the periosteum just at the edge for 2 mm or less.   This was followed by placement of K wires and then use of the cannulated drill placing 2 partially threaded screws.  Compression was obtained.  The C-arm was then brought back in and AP, lateral and mortise views showed restoration of ankle alignment reduction.    The posterior malleolar fracture fragment was then evaluated to gauge whether it should be fixed if it constituted a significant portion of the articular surface or whether, as an attachment site of the syndesmotic ligaments, its disruption and displacement contributed to instability. Based on these factors it did not require fixation.    I then performed an external rotation stress view under live fluoroscopy and did not identify any syndesmotic widening nor widening of the medial clear space to suggest a syndesmotic injury.  Wounds were irrigated once more and then closed in standard layered fashion using 2-0 Vicryl and 3-0 nylon. Because of her severely impaired bone quality and bilateral injuries, supplemental k wire fixation of the tibiotalar joint to back of reduction was deemed appropriate and this was placed using two 2.0 mm k wires with fluoro confirmation on multiple views.  A sterile gently compressive dressing was applied, and then a posterior and stirrup splint with the ankle extended just above neutral after the conclusion of the contralateral side.  I then turned attention to the left side, where the same steps were undertaken as this was the same fracture pattern. A timeout was held, and then an incision was made directly over the lateral malleolus with careful dissection to avoid injury to the superficial peroneal nerve.  The periosteum was left intact as we continued deep dissection.  The fracture site was identified and curettage and lavage used to remove hematoma.  With the assistance of distal manipulation and placement of tenaculums, we were able to obtain an anatomic reduction with interdigitation of the primary fracture  fragments.  Because of the angle and comminution of the fracture site, I was unable to place a lag screw but was able to hold this interdigitated during application of a lateral malleolus plate.  We used the Paragon system and placed standard fixation in the shaft and distal lateral malleolus, confirming plate position with x-ray and then continuing with a standard fixation as well as a locked fixation.  Final images showed appropriate reductional replacement, trajectory, and length.  Next, attention was turned to the medial side.  Here, a curvilinear incision was made to allow for distal placement of screws as well as direct access to the medial malleolus fracture site and joint.  There was a large medial malleolar fragment that did extend down to the anterior plafond.  This was debrided with curettage of the fracture edges as well as copious lavage of the joint.  I did inspect the posterior tibialis tendon, which was clearly visible and intact.  I did not identify any large fragments of articular cartilage loss off the dome of the talus.  There was no chondral injury of significance.  I then placed a drill hole in the medial metaphysis of the tibia and used a pointed tenaculum to gain compression and reduction of the fracture site, which was also seen to interdigitate as a 15 blade was used to scratch back the periosteum just at the edge for 2 mm or  less.  This was followed by placement of K wires and then use of the cannulated drill placing 2 partially threaded screws.  Compression was obtained.  The C-arm was then brought back in and AP, lateral and mortise views showed restoration of ankle alignment reduction.    The posterior malleolar fracture fragment was then evaluated to gauge whether it should be fixed if it constituted a significant portion of the articular surface or whether, as an attachment site of the syndesmotic ligaments, its disruption and displacement contributed to instability. Based on  these factors it did not require fixation.    I then performed an external rotation stress view under live fluoroscopy and did not identify any syndesmotic widening nor widening of the medial clear space to suggest a syndesmotic injury.  Wounds were irrigated once more and then closed in standard layered fashion using 2-0 Vicryl and 3-0 nylon. Because of her severely impaired bone quality and bilateral injuries, supplemental k wire fixation of the tibiotalar joint to back of reduction was deemed appropriate and this was placed using two 2.0 mm k wires with fluoro confirmation on multiple views.  A sterile gently compressive dressing was applied, and then a posterior and stirrup splint with the ankle extended just above neutral.  PROGNOSIS: The patient will be nonweightbearing in the splint with ice and elevation over the next 3 to 5 days.  We will plan to see her back in the office in 10-14 days for removal of sutures and transition into casts at that time.  Weightbearing in CAM boots at 6 weeks after pin removal.

## 2019-08-16 DIAGNOSIS — E785 Hyperlipidemia, unspecified: Secondary | ICD-10-CM | POA: Diagnosis not present

## 2019-08-16 DIAGNOSIS — K5900 Constipation, unspecified: Secondary | ICD-10-CM | POA: Diagnosis not present

## 2019-08-16 DIAGNOSIS — I10 Essential (primary) hypertension: Secondary | ICD-10-CM | POA: Diagnosis not present

## 2019-08-16 DIAGNOSIS — S82851D Displaced trimalleolar fracture of right lower leg, subsequent encounter for closed fracture with routine healing: Secondary | ICD-10-CM | POA: Diagnosis not present

## 2019-08-16 DIAGNOSIS — S82842D Displaced bimalleolar fracture of left lower leg, subsequent encounter for closed fracture with routine healing: Secondary | ICD-10-CM | POA: Diagnosis not present

## 2019-08-18 DIAGNOSIS — S82852D Displaced trimalleolar fracture of left lower leg, subsequent encounter for closed fracture with routine healing: Secondary | ICD-10-CM | POA: Diagnosis not present

## 2019-08-18 DIAGNOSIS — S82851D Displaced trimalleolar fracture of right lower leg, subsequent encounter for closed fracture with routine healing: Secondary | ICD-10-CM | POA: Diagnosis not present

## 2019-08-23 DIAGNOSIS — E785 Hyperlipidemia, unspecified: Secondary | ICD-10-CM | POA: Diagnosis not present

## 2019-08-23 DIAGNOSIS — M069 Rheumatoid arthritis, unspecified: Secondary | ICD-10-CM | POA: Diagnosis not present

## 2019-08-23 DIAGNOSIS — I10 Essential (primary) hypertension: Secondary | ICD-10-CM | POA: Diagnosis not present

## 2019-08-23 DIAGNOSIS — K5900 Constipation, unspecified: Secondary | ICD-10-CM | POA: Diagnosis not present

## 2019-08-26 DIAGNOSIS — E876 Hypokalemia: Secondary | ICD-10-CM | POA: Diagnosis not present

## 2019-08-26 DIAGNOSIS — M069 Rheumatoid arthritis, unspecified: Secondary | ICD-10-CM | POA: Diagnosis not present

## 2019-08-26 DIAGNOSIS — I10 Essential (primary) hypertension: Secondary | ICD-10-CM | POA: Diagnosis not present

## 2019-08-26 DIAGNOSIS — K5900 Constipation, unspecified: Secondary | ICD-10-CM | POA: Diagnosis not present

## 2019-08-26 DIAGNOSIS — S82851D Displaced trimalleolar fracture of right lower leg, subsequent encounter for closed fracture with routine healing: Secondary | ICD-10-CM | POA: Diagnosis not present

## 2019-09-20 DIAGNOSIS — S82851D Displaced trimalleolar fracture of right lower leg, subsequent encounter for closed fracture with routine healing: Secondary | ICD-10-CM | POA: Diagnosis not present

## 2019-09-20 DIAGNOSIS — M069 Rheumatoid arthritis, unspecified: Secondary | ICD-10-CM | POA: Diagnosis not present

## 2019-09-20 DIAGNOSIS — I10 Essential (primary) hypertension: Secondary | ICD-10-CM | POA: Diagnosis not present

## 2019-09-20 DIAGNOSIS — R5381 Other malaise: Secondary | ICD-10-CM | POA: Diagnosis not present

## 2019-09-20 DIAGNOSIS — E039 Hypothyroidism, unspecified: Secondary | ICD-10-CM | POA: Diagnosis not present

## 2019-09-27 DIAGNOSIS — R0989 Other specified symptoms and signs involving the circulatory and respiratory systems: Secondary | ICD-10-CM | POA: Diagnosis not present

## 2019-09-27 DIAGNOSIS — H6123 Impacted cerumen, bilateral: Secondary | ICD-10-CM | POA: Diagnosis not present

## 2019-09-27 DIAGNOSIS — R05 Cough: Secondary | ICD-10-CM | POA: Diagnosis not present

## 2019-09-27 DIAGNOSIS — K5900 Constipation, unspecified: Secondary | ICD-10-CM | POA: Diagnosis not present

## 2019-09-29 DIAGNOSIS — S82851D Displaced trimalleolar fracture of right lower leg, subsequent encounter for closed fracture with routine healing: Secondary | ICD-10-CM | POA: Diagnosis not present

## 2019-09-29 DIAGNOSIS — S82852D Displaced trimalleolar fracture of left lower leg, subsequent encounter for closed fracture with routine healing: Secondary | ICD-10-CM | POA: Diagnosis not present

## 2019-10-11 DIAGNOSIS — D649 Anemia, unspecified: Secondary | ICD-10-CM | POA: Diagnosis not present

## 2019-10-11 DIAGNOSIS — K5900 Constipation, unspecified: Secondary | ICD-10-CM | POA: Diagnosis not present

## 2019-10-11 DIAGNOSIS — B351 Tinea unguium: Secondary | ICD-10-CM | POA: Diagnosis not present

## 2019-10-11 DIAGNOSIS — R32 Unspecified urinary incontinence: Secondary | ICD-10-CM | POA: Diagnosis not present

## 2019-10-18 DIAGNOSIS — N3281 Overactive bladder: Secondary | ICD-10-CM | POA: Diagnosis not present

## 2019-10-18 DIAGNOSIS — B351 Tinea unguium: Secondary | ICD-10-CM | POA: Diagnosis not present

## 2019-10-18 DIAGNOSIS — R600 Localized edema: Secondary | ICD-10-CM | POA: Diagnosis not present

## 2019-10-18 DIAGNOSIS — D649 Anemia, unspecified: Secondary | ICD-10-CM | POA: Diagnosis not present

## 2019-10-18 DIAGNOSIS — R32 Unspecified urinary incontinence: Secondary | ICD-10-CM | POA: Diagnosis not present

## 2019-10-20 DIAGNOSIS — E559 Vitamin D deficiency, unspecified: Secondary | ICD-10-CM | POA: Diagnosis not present

## 2019-10-20 DIAGNOSIS — E785 Hyperlipidemia, unspecified: Secondary | ICD-10-CM | POA: Diagnosis not present

## 2019-10-20 DIAGNOSIS — M549 Dorsalgia, unspecified: Secondary | ICD-10-CM | POA: Diagnosis not present

## 2019-10-20 DIAGNOSIS — Z79891 Long term (current) use of opiate analgesic: Secondary | ICD-10-CM | POA: Diagnosis not present

## 2019-10-20 DIAGNOSIS — W19XXXD Unspecified fall, subsequent encounter: Secondary | ICD-10-CM | POA: Diagnosis not present

## 2019-10-20 DIAGNOSIS — F410 Panic disorder [episodic paroxysmal anxiety] without agoraphobia: Secondary | ICD-10-CM | POA: Diagnosis not present

## 2019-10-20 DIAGNOSIS — M80062D Age-related osteoporosis with current pathological fracture, left lower leg, subsequent encounter for fracture with routine healing: Secondary | ICD-10-CM | POA: Diagnosis not present

## 2019-10-20 DIAGNOSIS — G894 Chronic pain syndrome: Secondary | ICD-10-CM | POA: Diagnosis not present

## 2019-10-20 DIAGNOSIS — K219 Gastro-esophageal reflux disease without esophagitis: Secondary | ICD-10-CM | POA: Diagnosis not present

## 2019-10-20 DIAGNOSIS — I10 Essential (primary) hypertension: Secondary | ICD-10-CM | POA: Diagnosis not present

## 2019-10-20 DIAGNOSIS — K5900 Constipation, unspecified: Secondary | ICD-10-CM | POA: Diagnosis not present

## 2019-10-20 DIAGNOSIS — Z87440 Personal history of urinary (tract) infections: Secondary | ICD-10-CM | POA: Diagnosis not present

## 2019-10-20 DIAGNOSIS — M1990 Unspecified osteoarthritis, unspecified site: Secondary | ICD-10-CM | POA: Diagnosis not present

## 2019-10-20 DIAGNOSIS — M069 Rheumatoid arthritis, unspecified: Secondary | ICD-10-CM | POA: Diagnosis not present

## 2019-10-20 DIAGNOSIS — Z7982 Long term (current) use of aspirin: Secondary | ICD-10-CM | POA: Diagnosis not present

## 2019-10-20 DIAGNOSIS — E039 Hypothyroidism, unspecified: Secondary | ICD-10-CM | POA: Diagnosis not present

## 2019-10-20 DIAGNOSIS — Z7952 Long term (current) use of systemic steroids: Secondary | ICD-10-CM | POA: Diagnosis not present

## 2019-10-20 DIAGNOSIS — Z79899 Other long term (current) drug therapy: Secondary | ICD-10-CM | POA: Diagnosis not present

## 2019-10-20 DIAGNOSIS — R519 Headache, unspecified: Secondary | ICD-10-CM | POA: Diagnosis not present

## 2019-10-20 DIAGNOSIS — Z9181 History of falling: Secondary | ICD-10-CM | POA: Diagnosis not present

## 2019-10-20 DIAGNOSIS — S82851D Displaced trimalleolar fracture of right lower leg, subsequent encounter for closed fracture with routine healing: Secondary | ICD-10-CM | POA: Diagnosis not present

## 2019-10-20 DIAGNOSIS — M80061D Age-related osteoporosis with current pathological fracture, right lower leg, subsequent encounter for fracture with routine healing: Secondary | ICD-10-CM | POA: Diagnosis not present

## 2019-10-20 DIAGNOSIS — M542 Cervicalgia: Secondary | ICD-10-CM | POA: Diagnosis not present

## 2019-10-21 DIAGNOSIS — M80062D Age-related osteoporosis with current pathological fracture, left lower leg, subsequent encounter for fracture with routine healing: Secondary | ICD-10-CM | POA: Diagnosis not present

## 2019-10-21 DIAGNOSIS — G894 Chronic pain syndrome: Secondary | ICD-10-CM | POA: Diagnosis not present

## 2019-10-21 DIAGNOSIS — M542 Cervicalgia: Secondary | ICD-10-CM | POA: Diagnosis not present

## 2019-10-21 DIAGNOSIS — M549 Dorsalgia, unspecified: Secondary | ICD-10-CM | POA: Diagnosis not present

## 2019-10-21 DIAGNOSIS — M80061D Age-related osteoporosis with current pathological fracture, right lower leg, subsequent encounter for fracture with routine healing: Secondary | ICD-10-CM | POA: Diagnosis not present

## 2019-10-21 DIAGNOSIS — W19XXXD Unspecified fall, subsequent encounter: Secondary | ICD-10-CM | POA: Diagnosis not present

## 2019-10-25 DIAGNOSIS — M542 Cervicalgia: Secondary | ICD-10-CM | POA: Diagnosis not present

## 2019-10-25 DIAGNOSIS — M549 Dorsalgia, unspecified: Secondary | ICD-10-CM | POA: Diagnosis not present

## 2019-10-25 DIAGNOSIS — W19XXXD Unspecified fall, subsequent encounter: Secondary | ICD-10-CM | POA: Diagnosis not present

## 2019-10-25 DIAGNOSIS — M80061D Age-related osteoporosis with current pathological fracture, right lower leg, subsequent encounter for fracture with routine healing: Secondary | ICD-10-CM | POA: Diagnosis not present

## 2019-10-25 DIAGNOSIS — M80062D Age-related osteoporosis with current pathological fracture, left lower leg, subsequent encounter for fracture with routine healing: Secondary | ICD-10-CM | POA: Diagnosis not present

## 2019-10-25 DIAGNOSIS — G894 Chronic pain syndrome: Secondary | ICD-10-CM | POA: Diagnosis not present

## 2019-10-26 DIAGNOSIS — M80062D Age-related osteoporosis with current pathological fracture, left lower leg, subsequent encounter for fracture with routine healing: Secondary | ICD-10-CM | POA: Diagnosis not present

## 2019-10-26 DIAGNOSIS — M542 Cervicalgia: Secondary | ICD-10-CM | POA: Diagnosis not present

## 2019-10-26 DIAGNOSIS — M80061D Age-related osteoporosis with current pathological fracture, right lower leg, subsequent encounter for fracture with routine healing: Secondary | ICD-10-CM | POA: Diagnosis not present

## 2019-10-26 DIAGNOSIS — G894 Chronic pain syndrome: Secondary | ICD-10-CM | POA: Diagnosis not present

## 2019-10-26 DIAGNOSIS — M549 Dorsalgia, unspecified: Secondary | ICD-10-CM | POA: Diagnosis not present

## 2019-10-26 DIAGNOSIS — W19XXXD Unspecified fall, subsequent encounter: Secondary | ICD-10-CM | POA: Diagnosis not present

## 2019-10-27 DIAGNOSIS — S82852D Displaced trimalleolar fracture of left lower leg, subsequent encounter for closed fracture with routine healing: Secondary | ICD-10-CM | POA: Diagnosis not present

## 2019-10-27 DIAGNOSIS — S82851D Displaced trimalleolar fracture of right lower leg, subsequent encounter for closed fracture with routine healing: Secondary | ICD-10-CM | POA: Diagnosis not present

## 2019-10-28 DIAGNOSIS — M542 Cervicalgia: Secondary | ICD-10-CM | POA: Diagnosis not present

## 2019-10-28 DIAGNOSIS — W19XXXD Unspecified fall, subsequent encounter: Secondary | ICD-10-CM | POA: Diagnosis not present

## 2019-10-28 DIAGNOSIS — G894 Chronic pain syndrome: Secondary | ICD-10-CM | POA: Diagnosis not present

## 2019-10-28 DIAGNOSIS — M80062D Age-related osteoporosis with current pathological fracture, left lower leg, subsequent encounter for fracture with routine healing: Secondary | ICD-10-CM | POA: Diagnosis not present

## 2019-10-28 DIAGNOSIS — M549 Dorsalgia, unspecified: Secondary | ICD-10-CM | POA: Diagnosis not present

## 2019-10-28 DIAGNOSIS — M80061D Age-related osteoporosis with current pathological fracture, right lower leg, subsequent encounter for fracture with routine healing: Secondary | ICD-10-CM | POA: Diagnosis not present

## 2019-10-29 DIAGNOSIS — M80062D Age-related osteoporosis with current pathological fracture, left lower leg, subsequent encounter for fracture with routine healing: Secondary | ICD-10-CM | POA: Diagnosis not present

## 2019-10-29 DIAGNOSIS — M542 Cervicalgia: Secondary | ICD-10-CM | POA: Diagnosis not present

## 2019-10-29 DIAGNOSIS — M549 Dorsalgia, unspecified: Secondary | ICD-10-CM | POA: Diagnosis not present

## 2019-10-29 DIAGNOSIS — G894 Chronic pain syndrome: Secondary | ICD-10-CM | POA: Diagnosis not present

## 2019-10-29 DIAGNOSIS — M80061D Age-related osteoporosis with current pathological fracture, right lower leg, subsequent encounter for fracture with routine healing: Secondary | ICD-10-CM | POA: Diagnosis not present

## 2019-10-29 DIAGNOSIS — W19XXXD Unspecified fall, subsequent encounter: Secondary | ICD-10-CM | POA: Diagnosis not present

## 2019-11-01 DIAGNOSIS — W19XXXD Unspecified fall, subsequent encounter: Secondary | ICD-10-CM | POA: Diagnosis not present

## 2019-11-01 DIAGNOSIS — M542 Cervicalgia: Secondary | ICD-10-CM | POA: Diagnosis not present

## 2019-11-01 DIAGNOSIS — M549 Dorsalgia, unspecified: Secondary | ICD-10-CM | POA: Diagnosis not present

## 2019-11-01 DIAGNOSIS — M80061D Age-related osteoporosis with current pathological fracture, right lower leg, subsequent encounter for fracture with routine healing: Secondary | ICD-10-CM | POA: Diagnosis not present

## 2019-11-01 DIAGNOSIS — G894 Chronic pain syndrome: Secondary | ICD-10-CM | POA: Diagnosis not present

## 2019-11-01 DIAGNOSIS — M80062D Age-related osteoporosis with current pathological fracture, left lower leg, subsequent encounter for fracture with routine healing: Secondary | ICD-10-CM | POA: Diagnosis not present

## 2019-11-02 DIAGNOSIS — M80062D Age-related osteoporosis with current pathological fracture, left lower leg, subsequent encounter for fracture with routine healing: Secondary | ICD-10-CM | POA: Diagnosis not present

## 2019-11-02 DIAGNOSIS — M80061D Age-related osteoporosis with current pathological fracture, right lower leg, subsequent encounter for fracture with routine healing: Secondary | ICD-10-CM | POA: Diagnosis not present

## 2019-11-02 DIAGNOSIS — M542 Cervicalgia: Secondary | ICD-10-CM | POA: Diagnosis not present

## 2019-11-02 DIAGNOSIS — G894 Chronic pain syndrome: Secondary | ICD-10-CM | POA: Diagnosis not present

## 2019-11-02 DIAGNOSIS — M549 Dorsalgia, unspecified: Secondary | ICD-10-CM | POA: Diagnosis not present

## 2019-11-02 DIAGNOSIS — W19XXXD Unspecified fall, subsequent encounter: Secondary | ICD-10-CM | POA: Diagnosis not present

## 2019-11-03 DIAGNOSIS — W19XXXD Unspecified fall, subsequent encounter: Secondary | ICD-10-CM | POA: Diagnosis not present

## 2019-11-03 DIAGNOSIS — G894 Chronic pain syndrome: Secondary | ICD-10-CM | POA: Diagnosis not present

## 2019-11-03 DIAGNOSIS — M549 Dorsalgia, unspecified: Secondary | ICD-10-CM | POA: Diagnosis not present

## 2019-11-03 DIAGNOSIS — M542 Cervicalgia: Secondary | ICD-10-CM | POA: Diagnosis not present

## 2019-11-03 DIAGNOSIS — M80061D Age-related osteoporosis with current pathological fracture, right lower leg, subsequent encounter for fracture with routine healing: Secondary | ICD-10-CM | POA: Diagnosis not present

## 2019-11-03 DIAGNOSIS — M80062D Age-related osteoporosis with current pathological fracture, left lower leg, subsequent encounter for fracture with routine healing: Secondary | ICD-10-CM | POA: Diagnosis not present

## 2019-11-04 DIAGNOSIS — M542 Cervicalgia: Secondary | ICD-10-CM | POA: Diagnosis not present

## 2019-11-04 DIAGNOSIS — M549 Dorsalgia, unspecified: Secondary | ICD-10-CM | POA: Diagnosis not present

## 2019-11-04 DIAGNOSIS — G894 Chronic pain syndrome: Secondary | ICD-10-CM | POA: Diagnosis not present

## 2019-11-04 DIAGNOSIS — M80062D Age-related osteoporosis with current pathological fracture, left lower leg, subsequent encounter for fracture with routine healing: Secondary | ICD-10-CM | POA: Diagnosis not present

## 2019-11-04 DIAGNOSIS — W19XXXD Unspecified fall, subsequent encounter: Secondary | ICD-10-CM | POA: Diagnosis not present

## 2019-11-04 DIAGNOSIS — M80061D Age-related osteoporosis with current pathological fracture, right lower leg, subsequent encounter for fracture with routine healing: Secondary | ICD-10-CM | POA: Diagnosis not present

## 2019-11-08 DIAGNOSIS — W19XXXD Unspecified fall, subsequent encounter: Secondary | ICD-10-CM | POA: Diagnosis not present

## 2019-11-08 DIAGNOSIS — M542 Cervicalgia: Secondary | ICD-10-CM | POA: Diagnosis not present

## 2019-11-08 DIAGNOSIS — G894 Chronic pain syndrome: Secondary | ICD-10-CM | POA: Diagnosis not present

## 2019-11-08 DIAGNOSIS — M80061D Age-related osteoporosis with current pathological fracture, right lower leg, subsequent encounter for fracture with routine healing: Secondary | ICD-10-CM | POA: Diagnosis not present

## 2019-11-08 DIAGNOSIS — M80062D Age-related osteoporosis with current pathological fracture, left lower leg, subsequent encounter for fracture with routine healing: Secondary | ICD-10-CM | POA: Diagnosis not present

## 2019-11-08 DIAGNOSIS — M549 Dorsalgia, unspecified: Secondary | ICD-10-CM | POA: Diagnosis not present

## 2019-11-09 DIAGNOSIS — N390 Urinary tract infection, site not specified: Secondary | ICD-10-CM | POA: Diagnosis not present

## 2019-11-09 DIAGNOSIS — N393 Stress incontinence (female) (male): Secondary | ICD-10-CM | POA: Diagnosis not present

## 2019-11-10 DIAGNOSIS — M542 Cervicalgia: Secondary | ICD-10-CM | POA: Diagnosis not present

## 2019-11-10 DIAGNOSIS — W19XXXD Unspecified fall, subsequent encounter: Secondary | ICD-10-CM | POA: Diagnosis not present

## 2019-11-10 DIAGNOSIS — G894 Chronic pain syndrome: Secondary | ICD-10-CM | POA: Diagnosis not present

## 2019-11-10 DIAGNOSIS — M80061D Age-related osteoporosis with current pathological fracture, right lower leg, subsequent encounter for fracture with routine healing: Secondary | ICD-10-CM | POA: Diagnosis not present

## 2019-11-10 DIAGNOSIS — M549 Dorsalgia, unspecified: Secondary | ICD-10-CM | POA: Diagnosis not present

## 2019-11-10 DIAGNOSIS — M80062D Age-related osteoporosis with current pathological fracture, left lower leg, subsequent encounter for fracture with routine healing: Secondary | ICD-10-CM | POA: Diagnosis not present

## 2019-11-12 DIAGNOSIS — M80061D Age-related osteoporosis with current pathological fracture, right lower leg, subsequent encounter for fracture with routine healing: Secondary | ICD-10-CM | POA: Diagnosis not present

## 2019-11-12 DIAGNOSIS — M80062D Age-related osteoporosis with current pathological fracture, left lower leg, subsequent encounter for fracture with routine healing: Secondary | ICD-10-CM | POA: Diagnosis not present

## 2019-11-12 DIAGNOSIS — W19XXXD Unspecified fall, subsequent encounter: Secondary | ICD-10-CM | POA: Diagnosis not present

## 2019-11-12 DIAGNOSIS — M542 Cervicalgia: Secondary | ICD-10-CM | POA: Diagnosis not present

## 2019-11-12 DIAGNOSIS — M549 Dorsalgia, unspecified: Secondary | ICD-10-CM | POA: Diagnosis not present

## 2019-11-12 DIAGNOSIS — G894 Chronic pain syndrome: Secondary | ICD-10-CM | POA: Diagnosis not present

## 2019-11-15 DIAGNOSIS — M549 Dorsalgia, unspecified: Secondary | ICD-10-CM | POA: Diagnosis not present

## 2019-11-15 DIAGNOSIS — M542 Cervicalgia: Secondary | ICD-10-CM | POA: Diagnosis not present

## 2019-11-15 DIAGNOSIS — G894 Chronic pain syndrome: Secondary | ICD-10-CM | POA: Diagnosis not present

## 2019-11-15 DIAGNOSIS — W19XXXD Unspecified fall, subsequent encounter: Secondary | ICD-10-CM | POA: Diagnosis not present

## 2019-11-15 DIAGNOSIS — M80061D Age-related osteoporosis with current pathological fracture, right lower leg, subsequent encounter for fracture with routine healing: Secondary | ICD-10-CM | POA: Diagnosis not present

## 2019-11-15 DIAGNOSIS — M80062D Age-related osteoporosis with current pathological fracture, left lower leg, subsequent encounter for fracture with routine healing: Secondary | ICD-10-CM | POA: Diagnosis not present

## 2019-11-16 DIAGNOSIS — M542 Cervicalgia: Secondary | ICD-10-CM | POA: Diagnosis not present

## 2019-11-16 DIAGNOSIS — G894 Chronic pain syndrome: Secondary | ICD-10-CM | POA: Diagnosis not present

## 2019-11-16 DIAGNOSIS — M80061D Age-related osteoporosis with current pathological fracture, right lower leg, subsequent encounter for fracture with routine healing: Secondary | ICD-10-CM | POA: Diagnosis not present

## 2019-11-16 DIAGNOSIS — M80062D Age-related osteoporosis with current pathological fracture, left lower leg, subsequent encounter for fracture with routine healing: Secondary | ICD-10-CM | POA: Diagnosis not present

## 2019-11-16 DIAGNOSIS — W19XXXD Unspecified fall, subsequent encounter: Secondary | ICD-10-CM | POA: Diagnosis not present

## 2019-11-16 DIAGNOSIS — M549 Dorsalgia, unspecified: Secondary | ICD-10-CM | POA: Diagnosis not present

## 2019-11-19 DIAGNOSIS — Z79899 Other long term (current) drug therapy: Secondary | ICD-10-CM | POA: Diagnosis not present

## 2019-11-19 DIAGNOSIS — E559 Vitamin D deficiency, unspecified: Secondary | ICD-10-CM | POA: Diagnosis not present

## 2019-11-19 DIAGNOSIS — I10 Essential (primary) hypertension: Secondary | ICD-10-CM | POA: Diagnosis not present

## 2019-11-19 DIAGNOSIS — R519 Headache, unspecified: Secondary | ICD-10-CM | POA: Diagnosis not present

## 2019-11-19 DIAGNOSIS — W19XXXD Unspecified fall, subsequent encounter: Secondary | ICD-10-CM | POA: Diagnosis not present

## 2019-11-19 DIAGNOSIS — K219 Gastro-esophageal reflux disease without esophagitis: Secondary | ICD-10-CM | POA: Diagnosis not present

## 2019-11-19 DIAGNOSIS — K5900 Constipation, unspecified: Secondary | ICD-10-CM | POA: Diagnosis not present

## 2019-11-19 DIAGNOSIS — G894 Chronic pain syndrome: Secondary | ICD-10-CM | POA: Diagnosis not present

## 2019-11-19 DIAGNOSIS — F410 Panic disorder [episodic paroxysmal anxiety] without agoraphobia: Secondary | ICD-10-CM | POA: Diagnosis not present

## 2019-11-19 DIAGNOSIS — M549 Dorsalgia, unspecified: Secondary | ICD-10-CM | POA: Diagnosis not present

## 2019-11-19 DIAGNOSIS — Z7982 Long term (current) use of aspirin: Secondary | ICD-10-CM | POA: Diagnosis not present

## 2019-11-19 DIAGNOSIS — M80061D Age-related osteoporosis with current pathological fracture, right lower leg, subsequent encounter for fracture with routine healing: Secondary | ICD-10-CM | POA: Diagnosis not present

## 2019-11-19 DIAGNOSIS — M80062D Age-related osteoporosis with current pathological fracture, left lower leg, subsequent encounter for fracture with routine healing: Secondary | ICD-10-CM | POA: Diagnosis not present

## 2019-11-19 DIAGNOSIS — E039 Hypothyroidism, unspecified: Secondary | ICD-10-CM | POA: Diagnosis not present

## 2019-11-19 DIAGNOSIS — Z7952 Long term (current) use of systemic steroids: Secondary | ICD-10-CM | POA: Diagnosis not present

## 2019-11-19 DIAGNOSIS — Z9181 History of falling: Secondary | ICD-10-CM | POA: Diagnosis not present

## 2019-11-19 DIAGNOSIS — Z87440 Personal history of urinary (tract) infections: Secondary | ICD-10-CM | POA: Diagnosis not present

## 2019-11-19 DIAGNOSIS — M1990 Unspecified osteoarthritis, unspecified site: Secondary | ICD-10-CM | POA: Diagnosis not present

## 2019-11-19 DIAGNOSIS — M069 Rheumatoid arthritis, unspecified: Secondary | ICD-10-CM | POA: Diagnosis not present

## 2019-11-19 DIAGNOSIS — E785 Hyperlipidemia, unspecified: Secondary | ICD-10-CM | POA: Diagnosis not present

## 2019-11-19 DIAGNOSIS — M542 Cervicalgia: Secondary | ICD-10-CM | POA: Diagnosis not present

## 2019-11-19 DIAGNOSIS — Z79891 Long term (current) use of opiate analgesic: Secondary | ICD-10-CM | POA: Diagnosis not present

## 2019-11-26 DIAGNOSIS — W19XXXD Unspecified fall, subsequent encounter: Secondary | ICD-10-CM | POA: Diagnosis not present

## 2019-11-26 DIAGNOSIS — M80062D Age-related osteoporosis with current pathological fracture, left lower leg, subsequent encounter for fracture with routine healing: Secondary | ICD-10-CM | POA: Diagnosis not present

## 2019-11-26 DIAGNOSIS — M80061D Age-related osteoporosis with current pathological fracture, right lower leg, subsequent encounter for fracture with routine healing: Secondary | ICD-10-CM | POA: Diagnosis not present

## 2019-11-26 DIAGNOSIS — G894 Chronic pain syndrome: Secondary | ICD-10-CM | POA: Diagnosis not present

## 2019-11-26 DIAGNOSIS — M542 Cervicalgia: Secondary | ICD-10-CM | POA: Diagnosis not present

## 2019-11-26 DIAGNOSIS — M549 Dorsalgia, unspecified: Secondary | ICD-10-CM | POA: Diagnosis not present

## 2019-12-01 DIAGNOSIS — W19XXXD Unspecified fall, subsequent encounter: Secondary | ICD-10-CM | POA: Diagnosis not present

## 2019-12-01 DIAGNOSIS — G894 Chronic pain syndrome: Secondary | ICD-10-CM | POA: Diagnosis not present

## 2019-12-01 DIAGNOSIS — M80061D Age-related osteoporosis with current pathological fracture, right lower leg, subsequent encounter for fracture with routine healing: Secondary | ICD-10-CM | POA: Diagnosis not present

## 2019-12-01 DIAGNOSIS — M549 Dorsalgia, unspecified: Secondary | ICD-10-CM | POA: Diagnosis not present

## 2019-12-01 DIAGNOSIS — M542 Cervicalgia: Secondary | ICD-10-CM | POA: Diagnosis not present

## 2019-12-01 DIAGNOSIS — M80062D Age-related osteoporosis with current pathological fracture, left lower leg, subsequent encounter for fracture with routine healing: Secondary | ICD-10-CM | POA: Diagnosis not present

## 2019-12-08 DIAGNOSIS — G894 Chronic pain syndrome: Secondary | ICD-10-CM | POA: Diagnosis not present

## 2019-12-08 DIAGNOSIS — M80062D Age-related osteoporosis with current pathological fracture, left lower leg, subsequent encounter for fracture with routine healing: Secondary | ICD-10-CM | POA: Diagnosis not present

## 2019-12-08 DIAGNOSIS — M542 Cervicalgia: Secondary | ICD-10-CM | POA: Diagnosis not present

## 2019-12-08 DIAGNOSIS — M80061D Age-related osteoporosis with current pathological fracture, right lower leg, subsequent encounter for fracture with routine healing: Secondary | ICD-10-CM | POA: Diagnosis not present

## 2019-12-08 DIAGNOSIS — M549 Dorsalgia, unspecified: Secondary | ICD-10-CM | POA: Diagnosis not present

## 2019-12-08 DIAGNOSIS — W19XXXD Unspecified fall, subsequent encounter: Secondary | ICD-10-CM | POA: Diagnosis not present

## 2020-01-11 ENCOUNTER — Other Ambulatory Visit (HOSPITAL_COMMUNITY): Payer: Self-pay | Admitting: *Deleted

## 2020-01-12 ENCOUNTER — Ambulatory Visit (HOSPITAL_COMMUNITY)
Admission: RE | Admit: 2020-01-12 | Discharge: 2020-01-12 | Disposition: A | Discharge status: Home / Self Care | Payer: Medicare Other | Source: Ambulatory Visit | Attending: Internal Medicine | Admitting: Internal Medicine

## 2020-01-12 ENCOUNTER — Other Ambulatory Visit: Payer: Self-pay

## 2020-01-12 DIAGNOSIS — M810 Age-related osteoporosis without current pathological fracture: Secondary | ICD-10-CM | POA: Diagnosis not present

## 2020-01-12 MED ORDER — DENOSUMAB 60 MG/ML ~~LOC~~ SOSY: 60.0000 mg | PREFILLED_SYRINGE | Freq: Once | SUBCUTANEOUS | Status: AC

## 2020-01-12 MED ORDER — DENOSUMAB 60 MG/ML ~~LOC~~ SOSY
PREFILLED_SYRINGE | SUBCUTANEOUS | Status: AC
  Administered 2020-01-12: 60 mg via SUBCUTANEOUS
  Filled 2020-01-12: qty 1

## 2020-03-14 ENCOUNTER — Other Ambulatory Visit: Payer: Self-pay

## 2020-03-14 ENCOUNTER — Ambulatory Visit (INDEPENDENT_AMBULATORY_CARE_PROVIDER_SITE_OTHER): Payer: Medicare Other | Admitting: Podiatry

## 2020-03-14 DIAGNOSIS — M79609 Pain in unspecified limb: Secondary | ICD-10-CM

## 2020-03-14 DIAGNOSIS — M7742 Metatarsalgia, left foot: Secondary | ICD-10-CM

## 2020-03-14 DIAGNOSIS — M7741 Metatarsalgia, right foot: Secondary | ICD-10-CM

## 2020-03-14 DIAGNOSIS — B351 Tinea unguium: Secondary | ICD-10-CM

## 2020-04-21 NOTE — Progress Notes (Signed)
  Subjective:  Patient ID: [name], female    DOB: 11-24-1928,  MRN: [id number]  Chief Complaint  Patient presents with  . Nail Problem    RFC Nail trim   . Wound Check    Left foot wound at the bottom of foot. X 3 weeks.     87 y.o. female presents with the above complaint. History confirmed with patient.  States that the nails are painful and hard to cut also has a sore on the bottom of left foot for 3 weeks denies bleeding or drainage reports burning sensation  Objective:  Physical Exam: warm, good capillary refill, no trophic changes or ulcerative lesions, normal DP and PT pulses and normal sensory exam.  Pain palpation about the toenails Left Foot: Metatarsal fat pad atrophy noted with HPK submet 1/5 Right Foot: Fat pad atrophy noted metatarsal fat pad Assessment:   1. Pain due to onychomycosis of nail   2. Metatarsalgia of both feet      Plan:  Patient was evaluated and treated and all questions answered.  Metatarsalgia, fat pad atrophy -Educated on etiology.  Dispense offloading metatarsal pads to help with the pain in the ball of the foot courtesy paring of lesion.  Onychomycosis with pain -Nails debrided x10 to patient relief  Procedure: Nail Debridement Type of Debridement: manual, sharp debridement. Instrumentation: Nail nipper, rotary burr. Number of Nails: 10     No follow-ups on file.

## 2020-04-27 DIAGNOSIS — M810 Age-related osteoporosis without current pathological fracture: Secondary | ICD-10-CM | POA: Diagnosis not present

## 2020-04-27 DIAGNOSIS — G8929 Other chronic pain: Secondary | ICD-10-CM | POA: Diagnosis not present

## 2020-04-27 DIAGNOSIS — M0579 Rheumatoid arthritis with rheumatoid factor of multiple sites without organ or systems involvement: Secondary | ICD-10-CM | POA: Diagnosis not present

## 2020-04-27 DIAGNOSIS — Z79899 Other long term (current) drug therapy: Secondary | ICD-10-CM | POA: Diagnosis not present

## 2020-04-27 DIAGNOSIS — M2550 Pain in unspecified joint: Secondary | ICD-10-CM | POA: Diagnosis not present

## 2020-05-06 DIAGNOSIS — M810 Age-related osteoporosis without current pathological fracture: Secondary | ICD-10-CM | POA: Diagnosis not present

## 2020-05-06 DIAGNOSIS — E039 Hypothyroidism, unspecified: Secondary | ICD-10-CM | POA: Diagnosis not present

## 2020-05-06 DIAGNOSIS — I119 Hypertensive heart disease without heart failure: Secondary | ICD-10-CM | POA: Diagnosis not present

## 2020-05-06 DIAGNOSIS — E785 Hyperlipidemia, unspecified: Secondary | ICD-10-CM | POA: Diagnosis not present

## 2020-06-14 DIAGNOSIS — S5002XA Contusion of left elbow, initial encounter: Secondary | ICD-10-CM | POA: Diagnosis not present

## 2020-06-20 ENCOUNTER — Ambulatory Visit: Payer: Medicare Other | Admitting: Podiatry

## 2020-06-27 ENCOUNTER — Other Ambulatory Visit: Payer: Self-pay

## 2020-06-27 ENCOUNTER — Ambulatory Visit (INDEPENDENT_AMBULATORY_CARE_PROVIDER_SITE_OTHER): Payer: Medicare Other | Admitting: Podiatry

## 2020-06-27 DIAGNOSIS — M7741 Metatarsalgia, right foot: Secondary | ICD-10-CM | POA: Diagnosis not present

## 2020-06-27 DIAGNOSIS — M7742 Metatarsalgia, left foot: Secondary | ICD-10-CM | POA: Diagnosis not present

## 2020-06-27 DIAGNOSIS — L97521 Non-pressure chronic ulcer of other part of left foot limited to breakdown of skin: Secondary | ICD-10-CM

## 2020-06-27 NOTE — Progress Notes (Signed)
  Subjective:  Patient ID: Jocelyn Bauer, female    DOB: 19-Nov-1928,  MRN: [id number]  Chief Complaint  Patient presents with   Foot Problem    B/L burning mainly at night and when walking. Lesion under left great toe on ball of foot very bothersome.     87 y.o. female presents for wound care. Hx confirmed with patient.  Objective:  Physical Exam: Wound Location: left submet 1 Wound Measurement: pinpoint Wound Base: friable epithelium Peri-wound: intact, HPKotic Exudate: dry Fat pad atrophy noted. Assessment:   1. Metatarsalgia of both feet   2. Ulcerated, foot, left, limited to breakdown of skin [facility]    Plan:  Patient was evaluated and treated and all questions answered.  Ulcer left submet 1 -Debrided gently, small open ulcer -Dress with abx ointment and band-aid daily. Dressed as such today. -Dispense metatarsal pads. Did not tolerate the metatarsal sling pads  Return in about 4 weeks (around 07/25/2020) for ulcer f/u.

## 2020-07-17 ENCOUNTER — Encounter (HOSPITAL_COMMUNITY): Payer: BLUE CROSS/BLUE SHIELD

## 2020-07-17 DIAGNOSIS — M810 Age-related osteoporosis without current pathological fracture: Secondary | ICD-10-CM | POA: Diagnosis not present

## 2020-07-25 ENCOUNTER — Ambulatory Visit: Payer: Medicare Other | Admitting: Podiatry

## 2020-08-30 DIAGNOSIS — E039 Hypothyroidism, unspecified: Secondary | ICD-10-CM | POA: Diagnosis not present

## 2020-08-30 DIAGNOSIS — I10 Essential (primary) hypertension: Secondary | ICD-10-CM | POA: Diagnosis not present

## 2020-08-30 DIAGNOSIS — M533 Sacrococcygeal disorders, not elsewhere classified: Secondary | ICD-10-CM | POA: Diagnosis not present

## 2020-08-30 DIAGNOSIS — M069 Rheumatoid arthritis, unspecified: Secondary | ICD-10-CM | POA: Diagnosis not present

## 2020-08-30 DIAGNOSIS — M810 Age-related osteoporosis without current pathological fracture: Secondary | ICD-10-CM | POA: Diagnosis not present

## 2020-08-30 DIAGNOSIS — E785 Hyperlipidemia, unspecified: Secondary | ICD-10-CM | POA: Diagnosis not present

## 2020-08-30 DIAGNOSIS — E559 Vitamin D deficiency, unspecified: Secondary | ICD-10-CM | POA: Diagnosis not present

## 2020-09-05 DIAGNOSIS — I10 Essential (primary) hypertension: Secondary | ICD-10-CM | POA: Diagnosis not present

## 2020-09-05 DIAGNOSIS — R82998 Other abnormal findings in urine: Secondary | ICD-10-CM | POA: Diagnosis not present

## 2020-10-13 ENCOUNTER — Other Ambulatory Visit: Payer: Self-pay

## 2020-10-13 ENCOUNTER — Ambulatory Visit (INDEPENDENT_AMBULATORY_CARE_PROVIDER_SITE_OTHER): Payer: Medicare Other | Admitting: Podiatry

## 2020-10-13 DIAGNOSIS — I83018 Varicose veins of right lower extremity with ulcer other part of lower leg: Secondary | ICD-10-CM

## 2020-10-13 DIAGNOSIS — L97811 Non-pressure chronic ulcer of other part of right lower leg limited to breakdown of skin: Secondary | ICD-10-CM

## 2020-10-13 NOTE — Progress Notes (Addendum)
  Subjective:  Patient ID: Jocelyn Bauer, female    DOB: 1928-09-04,  MRN: [id number]  Chief Complaint  Patient presents with   Foot Ulcer    Pt states wound is painful, denies fever/chills/nausea/vomiting.   87 y.o. female presents for wound care. Hx confirmed with patient. Unsure when the ulceration started or how. Objective:  Physical Exam: Wound Location: right lateral malleolus Wound Measurement: right leg 1x0.5 Wound Base: Granular/Healthy Peri-wound: Reddened Venous stasis  Exudate: Scant/small amount Serosanguinous exudate wound without warmth, erythema, signs of acute infection  Assessment:   1. Venous stasis ulcer of other part of right lower leg limited to breakdown of skin with varicose veins (HCC)    Plan:  Patient was evaluated and treated and all questions answered.  Ulcer right lateral leg -Right leg wound noted today -Dressed with [employer] today. Patient to remove in 1 week and reapply neosporin and bandage daily. -Advised that should the wound worsen patient is to present promptly for eval -Surgical shoe dispensed - patient's regular shoe does not fit. Advised of non-coverage, ABN signed and on file.  Return in about 4 weeks (around 11/10/2020) for Venous ulcer right leg.

## 2020-10-20 ENCOUNTER — Ambulatory Visit: Payer: Medicare Other | Admitting: Podiatry

## 2020-10-31 ENCOUNTER — Ambulatory Visit: Payer: Medicare Other | Admitting: Podiatry

## 2020-11-03 DIAGNOSIS — L03115 Cellulitis of right lower limb: Secondary | ICD-10-CM | POA: Diagnosis not present

## 2020-11-03 DIAGNOSIS — L97919 Non-pressure chronic ulcer of unspecified part of right lower leg with unspecified severity: Secondary | ICD-10-CM | POA: Diagnosis not present

## 2020-11-03 DIAGNOSIS — I87311 Chronic venous hypertension (idiopathic) with ulcer of right lower extremity: Secondary | ICD-10-CM | POA: Diagnosis not present

## 2020-11-06 DIAGNOSIS — M810 Age-related osteoporosis without current pathological fracture: Secondary | ICD-10-CM | POA: Diagnosis not present

## 2020-11-06 DIAGNOSIS — E785 Hyperlipidemia, unspecified: Secondary | ICD-10-CM | POA: Diagnosis not present

## 2020-11-06 DIAGNOSIS — E039 Hypothyroidism, unspecified: Secondary | ICD-10-CM | POA: Diagnosis not present

## 2020-11-06 DIAGNOSIS — I119 Hypertensive heart disease without heart failure: Secondary | ICD-10-CM | POA: Diagnosis not present

## 2020-11-09 DIAGNOSIS — L97919 Non-pressure chronic ulcer of unspecified part of right lower leg with unspecified severity: Secondary | ICD-10-CM | POA: Diagnosis not present

## 2020-11-09 DIAGNOSIS — L03115 Cellulitis of right lower limb: Secondary | ICD-10-CM | POA: Diagnosis not present

## 2020-11-09 DIAGNOSIS — I87311 Chronic venous hypertension (idiopathic) with ulcer of right lower extremity: Secondary | ICD-10-CM | POA: Diagnosis not present

## 2020-11-10 ENCOUNTER — Ambulatory Visit: Payer: Medicare Other | Admitting: Podiatry

## 2020-11-20 DIAGNOSIS — I87311 Chronic venous hypertension (idiopathic) with ulcer of right lower extremity: Secondary | ICD-10-CM | POA: Diagnosis not present

## 2020-11-20 DIAGNOSIS — L97919 Non-pressure chronic ulcer of unspecified part of right lower leg with unspecified severity: Secondary | ICD-10-CM | POA: Diagnosis not present

## 2020-11-20 DIAGNOSIS — L03115 Cellulitis of right lower limb: Secondary | ICD-10-CM | POA: Diagnosis not present

## 2020-11-27 DIAGNOSIS — L03115 Cellulitis of right lower limb: Secondary | ICD-10-CM | POA: Diagnosis not present

## 2020-11-27 DIAGNOSIS — I87311 Chronic venous hypertension (idiopathic) with ulcer of right lower extremity: Secondary | ICD-10-CM | POA: Diagnosis not present

## 2020-11-27 DIAGNOSIS — L97919 Non-pressure chronic ulcer of unspecified part of right lower leg with unspecified severity: Secondary | ICD-10-CM | POA: Diagnosis not present

## 2020-12-04 DIAGNOSIS — L97919 Non-pressure chronic ulcer of unspecified part of right lower leg with unspecified severity: Secondary | ICD-10-CM | POA: Diagnosis not present

## 2020-12-04 DIAGNOSIS — L03115 Cellulitis of right lower limb: Secondary | ICD-10-CM | POA: Diagnosis not present

## 2020-12-04 DIAGNOSIS — I87311 Chronic venous hypertension (idiopathic) with ulcer of right lower extremity: Secondary | ICD-10-CM | POA: Diagnosis not present

## 2020-12-11 DIAGNOSIS — I87311 Chronic venous hypertension (idiopathic) with ulcer of right lower extremity: Secondary | ICD-10-CM | POA: Diagnosis not present

## 2020-12-11 DIAGNOSIS — L97919 Non-pressure chronic ulcer of unspecified part of right lower leg with unspecified severity: Secondary | ICD-10-CM | POA: Diagnosis not present

## 2020-12-11 DIAGNOSIS — L03115 Cellulitis of right lower limb: Secondary | ICD-10-CM | POA: Diagnosis not present

## 2020-12-19 DIAGNOSIS — L97919 Non-pressure chronic ulcer of unspecified part of right lower leg with unspecified severity: Secondary | ICD-10-CM | POA: Diagnosis not present

## 2020-12-19 DIAGNOSIS — L03115 Cellulitis of right lower limb: Secondary | ICD-10-CM | POA: Diagnosis not present

## 2020-12-19 DIAGNOSIS — I87311 Chronic venous hypertension (idiopathic) with ulcer of right lower extremity: Secondary | ICD-10-CM | POA: Diagnosis not present

## 2021-01-10 ENCOUNTER — Other Ambulatory Visit: Payer: Self-pay

## 2021-01-10 ENCOUNTER — Encounter (HOSPITAL_BASED_OUTPATIENT_CLINIC_OR_DEPARTMENT_OTHER): Payer: Medicare Other | Attending: Physician Assistant | Admitting: Physician Assistant

## 2021-01-10 DIAGNOSIS — X58XXXA Exposure to other specified factors, initial encounter: Secondary | ICD-10-CM | POA: Diagnosis not present

## 2021-01-10 DIAGNOSIS — L97316 Non-pressure chronic ulcer of right ankle with bone involvement without evidence of necrosis: Secondary | ICD-10-CM | POA: Diagnosis not present

## 2021-01-10 DIAGNOSIS — M150 Primary generalized (osteo)arthritis: Secondary | ICD-10-CM | POA: Diagnosis not present

## 2021-01-10 DIAGNOSIS — Z87891 Personal history of nicotine dependence: Secondary | ICD-10-CM | POA: Diagnosis not present

## 2021-01-10 DIAGNOSIS — T8131XA Disruption of external operation (surgical) wound, not elsewhere classified, initial encounter: Secondary | ICD-10-CM | POA: Insufficient documentation

## 2021-01-10 DIAGNOSIS — I10 Essential (primary) hypertension: Secondary | ICD-10-CM | POA: Diagnosis not present

## 2021-01-10 NOTE — Progress Notes (Signed)
DELLIA, DONNELLY ([id number]) Visit Report for 01/10/2021 Chief Complaint Document Details Patient Name: Date of Service: Jocelyn Bauer 01/10/2021 1:15 PM Medical Record Number: [id number] Patient Account Number: [id number] Date of Birth/Sex: Treating RN: [birth year] (88 y.o. Elam Dutch Primary Care Provider: Jerlyn Ly Other Clinician: Referring Provider: Treating Provider/Extender: Eli Hose in Treatment: 0 Information Obtained from: Patient Chief Complaint Right ankle ulcer Electronic Signature(s) Signed: 01/10/2021 2:28:28 PM By: Worthy Keeler PA-C Entered By: Worthy Keeler on 01/10/2021 16:01:09 -------------------------------------------------------------------------------- HPI Details Patient Name: Date of Service: Jocelyn Blake R. 01/10/2021 1:15 PM Medical Record Number: [id number] Patient Account Number: [id number] Date of Birth/Sex: Treating RN: 01/03/1929 (88 y.o. Elam Dutch Primary Care Provider: Jerlyn Ly Other Clinician: Referring Provider: Treating Provider/Extender: Eli Hose in Treatment: 0 History of Present Illness HPI Description: 01/10/2021 patient presents today for initial evaluation here in our clinic concerning issues that she is having with a wound over her right lateral ankle. She 2 years ago had an unfortunate fall where she fractured both ankles and had to have hardware placed. I am not exactly sure what type of hardware was placed and and what configuration. With that being said her daughter who is with her is also not certain exactly what they did she knows there was talk of plates and rods but she does know that screws were removed shortly following the surgery. Nonetheless currently the patient's wound shows hardware in the base of the wound. I think this is actually the cause of the wound to be perfectly honest. The patient does not have any signs of infection obviously at  this point which is good news nonetheless with hardware exposed this does bring up any concerns in that regard. She has been seen at [place] before being referred here and though they tell me this appears to be doing better I am still concerned about the fact that this is not likely to heal with hardware being right in the base of the wound there is really nothing for new tissue to grow over top of. Patient does have a history of hypertension as well as generalized arthritis she has multiple surgeries on her upper and lower extremities in the past. Dr. Ginette Pitman was the surgeon who actually did the ankle surgeries 2 years ago. Electronic Signature(s) Signed: 01/10/2021 2:40:57 PM By: Worthy Keeler PA-C Entered By: Worthy Keeler on 01/10/2021 14:40:57 -------------------------------------------------------------------------------- Physical Exam Details Patient Name: Date of Service: Jocelyn Bauer 01/10/2021 1:15 PM Medical Record Number: [id number] Patient Account Number: [id number] Date of Birth/Sex: Treating RN: 1928-10-15 (88 y.o. Elam Dutch Primary Care Provider: Jerlyn Ly Other Clinician: Referring Provider: Treating Provider/Extender: Candise Che Weeks in Treatment: 0 Constitutional patient is hypertensive.. pulse regular and within target range for patient.Marland Kitchen respirations regular, non-labored and within target range for patient.Marland Kitchen temperature within target range for patient.. Well-nourished and well-hydrated in no acute distress. Eyes conjunctiva clear no eyelid edema noted. pupils equal round and reactive to light and accommodation. Ears, Nose, Mouth, and Throat no gross abnormality of ear auricles or external auditory canals. normal hearing noted during conversation. mucus membranes moist. Respiratory normal breathing without difficulty. Cardiovascular 2+ dorsalis pedis/posterior tibialis pulses. no clubbing, cyanosis, significant edema, <3  sec cap refill. Musculoskeletal Patient unable to walk without assistance. Psychiatric this patient is able to make decisions and demonstrates good insight  into disease process. Alert and Oriented x 3. pleasant and cooperative. Notes Based on what I am seeing currently I do believe that the ideal thing this can be for [place] to go ahead and get the patient sent back to Dr. Ginette Pitman for further evaluation of this area. There is hardware directly exposed in the base of the wound bed to be perfectly honest I doubt this is going to heal significantly until were able to get the hardware issue corrected. That was discussed with the patient and her daughter today. Electronic Signature(s) Signed: 01/10/2021 2:41:41 PM By: Worthy Keeler PA-C Entered By: Worthy Keeler on 01/10/2021 14:41:41 -------------------------------------------------------------------------------- Physician Orders Details Patient Name: Date of Service: Jocelyn Bauer 01/10/2021 1:15 PM Medical Record Number: [id number] Patient Account Number: [id number] Date of Birth/Sex: Treating RN: [birth year] (88 y.o. 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 Will Refer to Dr. Marcelino Scot at [place]. If you have not heard from them in one week please call their office at [phone]. [phone] home Edema Control - Lymphedema /  SCD / Other Elevate legs to the level of the heart or above for 30 minutes daily and/or when sitting, a frequency of: - 3-4 times a day throughout the day. Avoid standing for long periods of time. Wound Treatment Wound #1 - Malleolus Wound Laterality: Right, Lateral Cleanser: Soap and Water 2 x Per Week Discharge Instructions: May shower and wash wound with dial antibacterial soap and water prior to dressing change. Cleanser: Wound Cleanser 2 x Per Week Discharge Instructions: Cleanse the wound with wound cleanser prior to applying a clean dressing using gauze sponges, not tissue or cotton balls. Prim Dressing: KerraCel Ag Gelling Fiber Dressing, 4x5 in (silver alginate) 2 x Per Week ary Discharge Instructions: Apply silver alginate to wound bed as instructed Secondary Dressing: bandaid 2 x Per Week Discharge Instructions: cover primary with a bandaid. [place] [facility] Tra[facility] Specialist referral - Referral to Dr. Marcelino Scot related to hardware exposed to right lateral ankle noted x2 months open area, ORIF performed by Dr. Marcelino Scot in 2021. Fax [phone]. Please call patient's home phone [phone] or daughter, Francene Boyers aylor cell, [phone]. - (ICD10 T81.31XA - Disruption of external operation (surgical) wound, not elsewhere classified, initial encounter) Electronic Signature(s) Signed: 01/10/2021 4:51:59 PM By: Worthy Keeler PA-C Signed: 01/10/2021 5:44:25 PM By: Deon Pilling RN, BSN Entered By: Deon Pilling on 01/10/2021 14:46:37 Prescription 01/10/2021 -------------------------------------------------------------------------------- Cruz Condon R. Worthy Keeler [place] Patient Name: Provider: 11-17-1928 [id number] Date of Birth: NPI#: [id number] Sex: DEA #: [phone] Phone #: License #: [place] Patient Address: [place] [address] [place], [place] [address] [place], [place]  [address] [phone] Allergies Iodinated Contrast Media; Boostrix Tdap; iodine; Streptococcus pneumoniae Provider's Orders Orthopaedic Trauma Specialist referral - ICD10: T81.31XA - Referral to Dr. Marcelino Scot related to hardware exposed to right lateral ankle noted x2 months open area, ORIF performed by Dr. Marcelino Scot in 2021. Fax [phone]. Please  call patient's home phone [phone] or daughter, Francene Boyers aylor cell, [phone]. Hand Signature: Date(s): Electronic Signature(s) Signed: 01/10/2021 4:51:59 PM By: Worthy Keeler PA-C Signed: 01/10/2021 5:44:25 PM By: Deon Pilling RN, BSN Entered By: Deon Pilling on 01/10/2021 14:46:37 -------------------------------------------------------------------------------- Problem List Details Patient Name: Date of Service: Jocelyn Blake R. 01/10/2021 1:15 PM Medical Record Number: [id number] Patient Account Number: [id number] Date of Birth/Sex: Treating RN: 07/28/1928 (88 y.o. Elam Dutch Primary Care Provider: Jerlyn Ly Other Clinician: Referring Provider: Treating Provider/Extender: Eli Hose in Treatment: 0 Active Problems ICD-10 Encounter Code Description Active Date MDM Diagnosis T81.31XA Disruption of external operation (surgical) wound, not elsewhere classified, 01/10/2021 No Yes initial encounter L97.316 Non-pressure chronic ulcer of right ankle with bone involvement without 01/10/2021 No Yes evidence of necrosis I10 Essential (primary) hypertension 01/10/2021 No Yes M15.0 Primary generalized (osteo)arthritis 01/10/2021 No Yes Inactive Problems Resolved Problems Electronic Signature(s) Signed: 01/10/2021 2:27:57 PM By: Worthy Keeler PA-C Entered By: Worthy Keeler on 01/10/2021 14:27:57 -------------------------------------------------------------------------------- Progress Note Details Patient Name: Date of Service: Jocelyn Blake R. 01/10/2021 1:15 PM Medical Record Number: [id number] Patient  Account Number: [id number] Date of Birth/Sex: Treating RN: Feb 14, 1928 (88 y.o. Elam Dutch Primary Care Provider: Jerlyn Ly Other Clinician: Referring Provider: Treating Provider/Extender: Eli Hose in Treatment: 0 Subjective Chief Complaint Information obtained from Patient Right ankle ulcer History of Present Illness (HPI) 01/10/2021 patient presents today for initial evaluation here in our clinic concerning issues that she is having with a wound over her right lateral ankle. She 2 years ago had an unfortunate fall where she fractured both ankles and had to have hardware placed. I am not exactly sure what type of hardware was placed and and what configuration. With that being said her daughter who is with her is also not certain exactly what they did she knows there was talk of plates and rods but she does know that screws were removed shortly following the surgery. Nonetheless currently the patient's wound shows hardware in the base of the wound. I think this is actually the cause of the wound to be perfectly honest. The patient does not have any signs of infection obviously at this point which is good news nonetheless with hardware exposed this does bring up any concerns in that regard. She has been seen at [place] before being referred here and though they tell me this appears to be doing better I am still concerned about the fact that this is not likely to heal with hardware being right in the base of the wound there is really nothing for new tissue to grow over top of. Patient does have a history of hypertension as well as generalized arthritis she has multiple surgeries on her upper and lower extremities in the past. Dr. Ginette Pitman was the surgeon who actually did the ankle surgeries 2 years ago. Patient History Information obtained from Patient. Allergies Iodinated Contrast Media, Boostrix Tdap (Reaction: hives), iodine (Reaction: rash),  Streptococcus pneumoniae Family History Cancer - Father,Siblings, Heart Disease - Mother, Lung Disease - Siblings, No family history of Diabetes, Hereditary Spherocytosis, Hypertension, Kidney Disease, Seizures, Stroke, Thyroid Problems, Tuberculosis. Social History Former smoker - >30 years, Marital Status - Widowed, Alcohol Use - Never, Drug Use - No History, Caffeine Use - Never. Medical History Eyes Patient has history of Cataracts - bilateral removed Denies history of Glaucoma, Optic Neuritis Hematologic/Lymphatic Denies history of Anemia,  Hemophilia, Human Immunodeficiency Virus, Lymphedema, Sickle Cell Disease Respiratory Denies history of Aspiration, Asthma, Chronic Obstructive Pulmonary Disease (COPD), Pneumothorax, Sleep Apnea, Tuberculosis Cardiovascular Patient has history of Hypertension Denies history of Angina, Arrhythmia, Congestive Heart Failure, Coronary Artery Disease, Deep Vein Thrombosis, Hypotension, Myocardial Infarction, Peripheral Arterial Disease, Peripheral Venous Disease, Phlebitis, Vasculitis Gastrointestinal Denies history of Cirrhosis , Colitis, Crohnoos, Hepatitis A, Hepatitis B, Hepatitis C Endocrine Denies history of Type I Diabetes, Type II Diabetes Genitourinary Denies history of End Stage Renal Disease Immunological Denies history of Lupus Erythematosus, Raynaudoos, Scleroderma Integumentary (Skin) Denies history of History of Burn Musculoskeletal Patient has history of Rheumatoid Arthritis, Osteoarthritis Denies history of Gout, Osteomyelitis Neurologic Denies history of Dementia, Neuropathy, Quadriplegia, Paraplegia, Seizure Disorder Oncologic Denies history of Received Chemotherapy, Received Radiation Psychiatric Denies history of Anorexia/bulimia, Confinement Anxiety Hospitalization/Surgery History - bilateral ORIF 07/2019 fell. - bilateral elbow surgeries. - left hip 1998 and revision left hip 2013. - thyroidectomy. Medical A Surgical  History Notes nd Cardiovascular hyperlipidemia Genitourinary UTI Musculoskeletal osteoporosis Psychiatric anxeity panic attacks. Review of Systems (ROS) Constitutional Symptoms (General Health) Denies complaints or symptoms of Fatigue, Fever, Chills, Marked Weight Change. Eyes Complains or has symptoms of Glasses / Contacts - reading. Denies complaints or symptoms of Dry Eyes, Vision Changes. Ear/Nose/Mouth/Throat Denies complaints or symptoms of Chronic sinus problems or rhinitis. Respiratory Denies complaints or symptoms of Chronic or frequent coughs, Shortness of Breath. Cardiovascular Denies complaints or symptoms of Chest pain. Gastrointestinal Denies complaints or symptoms of Frequent diarrhea, Nausea, Vomiting. Endocrine Denies complaints or symptoms of Heat/cold intolerance. Genitourinary Denies complaints or symptoms of Frequent urination. Integumentary (Skin) Complains or has symptoms of Wounds - right ankle. Musculoskeletal Denies complaints or symptoms of Muscle Pain, Muscle Weakness. Neurologic Denies complaints or symptoms of Numbness/parasthesias. Psychiatric Denies complaints or symptoms of Claustrophobia, Suicidal. Objective Constitutional patient is hypertensive.. pulse regular and within target range for patient.Marland Kitchen respirations regular, non-labored and within target range for patient.Marland Kitchen temperature within target range for patient.. Well-nourished and well-hydrated in no acute distress. Vitals Time Taken: 1:28 PM, Height: 61 in, Source: Stated, Weight: 137 lbs, Source: Stated, BMI: 25.9, Temperature: 98.3 F, Pulse: 67 bpm, Respiratory Rate: 18 breaths/min, Blood Pressure: 159/66 mmHg. Eyes conjunctiva clear no eyelid edema noted. pupils equal round and reactive to light and accommodation. Ears, Nose, Mouth, and Throat no gross abnormality of ear auricles or external auditory canals. normal hearing noted during conversation. mucus membranes  moist. Respiratory normal breathing without difficulty. Cardiovascular 2+ dorsalis pedis/posterior tibialis pulses. no clubbing, cyanosis, significant edema, Musculoskeletal Patient unable to walk without assistance. Psychiatric this patient is able to make decisions and demonstrates good insight into disease process. Alert and Oriented x 3. pleasant and cooperative. General Notes: Based on what I am seeing currently I do believe that the ideal thing this can be for [place] to go ahead and get the patient sent back to Dr. Ginette Pitman for further evaluation of this area. There is hardware directly exposed in the base of the wound bed to be perfectly honest I doubt this is going to heal significantly until were able to get the hardware issue corrected. That was discussed with the patient and her daughter today. Integumentary (Hair, Skin) Wound #1 status is Open. Original cause of wound was Gradually Appeared. The date acquired was: 11/07/2020. The wound is located on the Right,Lateral Malleolus. The wound measures 0.2cm length x 0.2cm width x 0.2cm depth; 0.031cm^2 area and 0.006cm^3 volume. There is no tunneling noted, however, there is undermining starting  at 12:00 and ending at 12:00 with a maximum distance of 0.2cm. There is a medium amount of serosanguineous drainage noted. The wound margin is distinct with the outline attached to the wound base. There is no granulation within the wound bed. There is no necrotic tissue within the wound bed. General Notes: hardware exposed. 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 Will Refer to Dr.  Marcelino Scot at [place]. If you have not heard from them in one week please call their office at [phone]. [phone] home Edema Control - Lymphedema / SCD / Other: Elevate legs to the level of the heart or above for 30 minutes daily and/or when sitting, a frequency of: - 3-4 times a day throughout the day. Avoid standing for long periods of time. ordered were: Orthopaedic Trauma Specialist referral - Referral to Dr. Marcelino Scot related to hardware exposed to right lateral ankle noted x2 months open area, ORIF performed by Dr. Marcelino Scot in 2021. Fax [phone]. Please call patient's home phone [phone] or daughter, Francene Boyers aylor cell, [phone]. WOUND #1: - Malleolus Wound Laterality: Right, Lateral Cleanser: Soap and Water 2 x Per Week/ Discharge Instructions: May shower and wash wound with dial antibacterial soap and water prior to dressing change. Cleanser: Wound Cleanser 2 x Per Week/ Discharge Instructions: Cleanse the wound with wound cleanser prior to applying a clean dressing using gauze sponges, not tissue or cotton balls. Prim Dressing: KerraCel Ag Gelling Fiber Dressing, 4x5 in (silver alginate) 2 x Per Week/ ary Discharge Instructions: Apply silver alginate to wound bed as instructed Secondary Dressing: bandaid 2 x Per Week/ Discharge Instructions: cover primary with a bandaid. 1. Based on what I am seeing on we will go ahead make referral back to Dr. Ginette Pitman who was the original treating surgeon for the ankles 2 years ago. I think that this is going to have to be surgically corrected from hardware perspective before the wound has any chance to heal. 2. Also can recommend at this time based on what I am seeing that we go ahead and have the patient follow-up here at the wound care center just as needed we will have them use silver alginate and a Band-Aid to put over the wound area to hopefully try to keep this clean and protected until she is able to see Dr. Ginette Pitman. We  will see her back for follow-up visit as needed. Electronic Signature(s) Signed: 01/10/2021 2:56:22 PM By: Worthy Keeler PA-C Previous Signature: 01/10/2021 2:42:30 PM Version By: Worthy Keeler PA-C Entered By: Worthy Keeler on 01/10/2021 14:56:22 -------------------------------------------------------------------------------- HxROS Details Patient Name: Date of Service: Jocelyn Blake R. 01/10/2021 1:15 PM Medical Record Number: [id number] Patient Account Number: [id number] Date of Birth/Sex: Treating RN: 04-24-1928 (88 y.o. Elam Dutch Primary Care Provider: Jerlyn Ly Other Clinician: Referring Provider: Treating Provider/Extender: Eli Hose in Treatment: 0 Information Obtained From Patient Constitutional Symptoms (General Health) Complaints and Symptoms: Negative for: Fatigue; Fever; Chills; Marked Weight Change Eyes Complaints and Symptoms: Positive for: Glasses / Contacts - reading Negative for: Dry Eyes; Vision Changes Medical History: Positive for: Cataracts - bilateral removed Negative for: Glaucoma; Optic Neuritis Ear/Nose/Mouth/Throat Complaints and Symptoms: Negative for: Chronic sinus  problems or rhinitis Respiratory Complaints and Symptoms: Negative for: Chronic or frequent coughs; Shortness of Breath Medical History: Negative for: Aspiration; Asthma; Chronic Obstructive Pulmonary Disease (COPD); Pneumothorax; Sleep Apnea; Tuberculosis Cardiovascular Complaints and Symptoms: Negative for: Chest pain Medical History: Positive for: Hypertension Negative for: Angina; Arrhythmia; Congestive Heart Failure; Coronary Artery Disease; Deep Vein Thrombosis; Hypotension; Myocardial Infarction; Peripheral Arterial Disease; Peripheral Venous Disease; Phlebitis; Vasculitis Past Medical History Notes: hyperlipidemia Gastrointestinal Complaints and Symptoms: Negative for: Frequent diarrhea; Nausea; Vomiting Medical  History: Negative for: Cirrhosis ; Colitis; Crohns; Hepatitis A; Hepatitis B; Hepatitis C Endocrine Complaints and Symptoms: Negative for: Heat/cold intolerance Medical History: Negative for: Type I Diabetes; Type II Diabetes Genitourinary Complaints and Symptoms: Negative for: Frequent urination Medical History: Negative for: End Stage Renal Disease Past Medical History Notes: UTI Integumentary (Skin) Complaints and Symptoms: Positive for: Wounds - right ankle Medical History: Negative for: History of Burn Musculoskeletal Complaints and Symptoms: Negative for: Muscle Pain; Muscle Weakness Medical History: Positive for: Rheumatoid Arthritis; Osteoarthritis Negative for: Gout; Osteomyelitis Past Medical History Notes: osteoporosis Neurologic Complaints and Symptoms: Negative for: Numbness/parasthesias Medical History: Negative for: Dementia; Neuropathy; Quadriplegia; Paraplegia; Seizure Disorder Psychiatric Complaints and Symptoms: Negative for: Claustrophobia; Suicidal Medical History: Negative for: Anorexia/bulimia; Confinement Anxiety Past Medical History Notes: anxeity panic attacks. Hematologic/Lymphatic Medical History: Negative for: Anemia; Hemophilia; Human Immunodeficiency Virus; Lymphedema; Sickle Cell Disease Immunological Medical History: Negative for: Lupus Erythematosus; Raynauds; Scleroderma Oncologic Medical History: Negative for: Received Chemotherapy; Received Radiation HBO Extended History Items Eyes: Cataracts Immunizations Pneumococcal Vaccine: Received Pneumococcal Vaccination: No Implantable Devices No devices added Hospitalization / Surgery History Type of Hospitalization/Surgery bilateral ORIF 07/2019 fell bilateral elbow surgeries left hip 1998 and revision left hip 2013 thyroidectomy Family and Social History Cancer: Yes - Father,Siblings; Diabetes: No; Heart Disease: Yes - Mother; Hereditary Spherocytosis: No; Hypertension: No;  Kidney Disease: No; Lung Disease: Yes - Siblings; Seizures: No; Stroke: No; Thyroid Problems: No; Tuberculosis: No; Former smoker - >30 years; Marital Status - Widowed; Alcohol Use: Never; Drug Use: No History; Caffeine Use: Never; Financial Concerns: No; Food, Clothing or Shelter Needs: No; Support System Lacking: No; Transportation Concerns: No Electronic Signature(s) Signed: 01/10/2021 4:51:59 PM By: Worthy Keeler PA-C Signed: 01/10/2021 5:43:10 PM By: Baruch Gouty RN, BSN Entered By: Baruch Gouty on 01/10/2021 13:49:34 -------------------------------------------------------------------------------- SuperBill Details Patient Name: Date of Service: Jocelyn Blake R. 01/10/2021 Medical Record Number: [id number] Patient Account Number: [id number] Date of Birth/Sex: Treating RN: 1928/02/12 (88 y.o. 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 I10 Essential (primary) hypertension M15.0 Primary generalized (osteo)arthritis Facility Procedures CPT4 Code: 59935701 Description: 99214 - WOUND CARE VISIT-LEV 4 EST PT Modifier: Quantity: 1 Physician Procedures : CPT4 Code Description Modifier [id number] [id number] - WC PHYS LEVEL 4 - NEW PT ICD-10 Diagnosis Description T81.31XA Disruption of external operation (surgical) wound, not elsewhere classified, initial encounter 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

## 2021-01-10 NOTE — Progress Notes (Signed)
KEARA, PAGLIARULO ([id number]) Visit Report for 01/10/2021 Allergy List Details Patient Name: Date of Service: Jocelyn Bauer 01/10/2021 1:15 PM Medical Record Number: [id number] Patient Account Number: [id number] Date of Birth/Sex: Treating RN: 1928-12-15 (88 y.o. Jocelyn Bauer Primary Care Sanjana Folz: Jerlyn Ly Other Clinician: Referring Lakeena Downie: Treating Zohar Maroney/Extender: Candise Che Weeks in Treatment: 0 Allergies Active Allergies Iodinated Contrast Media Boostrix Tdap Reaction: hives iodine Reaction: rash Streptococcus pneumoniae Allergy Notes Electronic Signature(s) Signed: 01/10/2021 5:43:10 PM By: Baruch Gouty RN, BSN Entered By: Baruch Gouty on 01/10/2021 13:32:26 -------------------------------------------------------------------------------- Arrival Information Details Patient Name: Date of Service: Jocelyn Blake R. 01/10/2021 1:15 PM Medical Record Number: [id number] Patient Account Number: [id number] Date of Birth/Sex: Treating RN: 10-27-1928 (88 y.o. Jocelyn Bauer Primary Care Nuri Branca: Jerlyn Ly Other Clinician: Referring Triston Skare: Treating Allaina Brotzman/Extender: Eli Hose in Treatment: 0 Visit Information Patient Arrived: Wheel Chair Arrival Time: 13:22 Accompanied By: daughter Transfer Assistance: Manual Patient Identification Verified: Yes Secondary Verification Process Completed: Yes Patient Requires Transmission-Based Precautions: No Patient Has Alerts: Yes Patient Alerts: ABI L [place] 01/10/21 Electronic Signature(s) Signed: 01/10/2021 5:43:10 PM By: Baruch Gouty RN, BSN Entered By: Baruch Gouty on 01/10/2021 14:00:18 -------------------------------------------------------------------------------- Clinic Level of Care Assessment Details Patient Name: Date of Service: Jocelyn Bauer 01/10/2021 1:15 PM Medical Record Number: [id number] Patient Account Number: [id number] Date of  Birth/Sex: Treating RN: 12-Jan-1928 (88 y.o. Jocelyn Bauer Primary Care Maurissa Ambrose: Jerlyn Ly Other Clinician: Referring Daimien Patmon: Treating Senaya Dicenso/Extender: Eli Hose in Treatment: 0 Clinic Level of Care Assessment Items TOOL 2 Quantity Score X- 1 0 Use when only an EandM is performed on the INITIAL visit ASSESSMENTS - Nursing Assessment / Reassessment X- 1 20 General Physical Exam (combine w/ comprehensive assessment (listed just below) when performed on new pt. evals) X- 1 25 Comprehensive Assessment (HX, ROS, Risk Assessments, Wounds Hx, etc.) ASSESSMENTS - Wound and Skin A ssessment / Reassessment X - Simple Wound Assessment / Reassessment - one wound 1 5 []  - 0 Complex Wound Assessment / Reassessment - multiple wounds X- 1 10 Dermatologic / Skin Assessment (not related to wound area) ASSESSMENTS - Ostomy and/or Continence Assessment and Care []  - 0 Incontinence Assessment and Management []  - 0 Ostomy Care Assessment and Management (repouching, etc.) PROCESS - Coordination of Care X - Simple Patient / Family Education for ongoing care 1 15 []  - 0 Complex (extensive) Patient / Family Education for ongoing care X- 1 10 Staff obtains Programmer, systems, Records, T Results / Process Orders est []  - 0 Staff telephones HHA, Nursing Homes / Clarify orders / etc []  - 0 Routine Transfer to another Facility (non-emergent condition) []  - 0 Routine Hospital Admission (non-emergent condition) []  - 0 New Admissions / Biomedical engineer / Ordering NPWT Apligraf, etc. , []  - 0 Emergency Hospital Admission (emergent condition) X- 1 10 Simple Discharge Coordination []  - 0 Complex (extensive) Discharge Coordination PROCESS - Special Needs []  - 0 Pediatric / Minor Patient Management []  - 0 Isolation Patient Management []  - 0 Hearing / Language / Visual special needs []  - 0 Assessment of Community assistance (transportation, D/C planning, etc.) []   - 0 Additional assistance / Altered mentation []  - 0 Support Surface(s) Assessment (bed, cushion, seat, etc.) INTERVENTIONS - Wound Cleansing / Measurement X- 1 5 Wound Imaging (photographs - any number of wounds) []  - 0 Wound Tracing (instead of photographs) X- 1  5 Simple Wound Measurement - one wound []  - 0 Complex Wound Measurement - multiple wounds X- 1 5 Simple Wound Cleansing - one wound []  - 0 Complex Wound Cleansing - multiple wounds INTERVENTIONS - Wound Dressings X - Small Wound Dressing one or multiple wounds 1 10 []  - 0 Medium Wound Dressing one or multiple wounds []  - 0 Large Wound Dressing one or multiple wounds []  - 0 Application of Medications - injection INTERVENTIONS - Miscellaneous []  - 0 External ear exam []  - 0 Specimen Collection (cultures, biopsies, blood, body fluids, etc.) []  - 0 Specimen(s) / Culture(s) sent or taken to Lab for analysis []  - 0 Patient Transfer (multiple staff / Harrel Lemon Lift / Similar devices) []  - 0 Simple Staple / Suture removal (25 or less) []  - 0 Complex Staple / Suture removal (26 or more) []  - 0 Hypo / Hyperglycemic Management (close monitor of Blood Glucose) X- 1 15 Ankle / Brachial Index (ABI) - do not check if billed separately Has the patient been seen at the hospital within the last three years: Yes Total Score: 135 Level Of Care: New/Established - Level 4 Electronic Signature(s) Signed: 01/10/2021 5:44:25 PM By: Deon Pilling RN, BSN Entered By: Deon Pilling on 01/10/2021 16:21:36 -------------------------------------------------------------------------------- Encounter Discharge Information Details Patient Name: Date of Service: Jocelyn Blake R. 01/10/2021 1:15 PM Medical Record Number: [id number] Patient Account Number: [id number] Date of Birth/Sex: Treating RN: 03-30-1928 (88 y.o. Jocelyn Bauer Primary Care Deneice Wack: Jerlyn Ly Other Clinician: Referring Halie Gass: Treating Isadore Palecek/Extender: Eli Hose in Treatment: 0 Encounter Discharge Information Items Discharge Condition: Stable Ambulatory Status: Wheelchair Discharge Destination: Home Transportation: Private Auto Accompanied By: daughter Schedule Follow-up Appointment: No Clinical Summary of Care: Electronic Signature(s) Signed: 01/10/2021 5:44:25 PM By: Deon Pilling RN, BSN Entered By: Deon Pilling on 01/10/2021 16:20:38 -------------------------------------------------------------------------------- Lower Extremity Assessment Details Patient Name: Date of Service: Jocelyn Blake R. 01/10/2021 1:15 PM Medical Record Number: [id number] Patient Account Number: [id number] Date of Birth/Sex: Treating RN: 11-May-1928 (88 y.o. Jocelyn Bauer, Jocelyn Bauer Primary Care Kathrynne Kulinski: Jerlyn Ly Other Clinician: Referring Jamera Vanloan: Treating Paiden Cavell/Extender: Candise Che Weeks in Treatment: 0 Edema Assessment Assessed: [Left: No] [Right: Yes] Edema: [Left: Ye] [Right: s] Calf Left: Right: Point of Measurement: 30 cm From Medial Instep 36 cm Ankle Left: Right: Point of Measurement: 9 cm From Medial Instep 22 cm Knee To Floor Left: Right: From Medial Instep 40 cm Vascular Assessment Pulses: Dorsalis Pedis Palpable: [Right:Yes] Doppler Audible: [Right:Yes] Posterior Tibial Palpable: [Right:Yes Yes] Notes unable to obtain ABI r/t >28mmHg. Electronic Signature(s) Signed: 01/10/2021 5:43:10 PM By: Baruch Gouty RN, BSN Entered By: Baruch Gouty on 01/10/2021 13:59:40 -------------------------------------------------------------------------------- Multi-Disciplinary Care Plan Details Patient Name: Date of Service: Jocelyn Blake R. 01/10/2021 1:15 PM Medical Record Number: [id number] Patient Account Number: [id number] Date of Birth/Sex: Treating RN: [birth year] (88 y.o. Jocelyn Bauer Primary Care Linden Tagliaferro: Jerlyn Ly Other Clinician: Referring Deontra Pereyra: Treating  Travia Onstad/Extender: Eli Hose in Treatment: 0 Active Inactive Electronic Signature(s) Signed: 01/10/2021 5:44:25 PM By: Deon Pilling RN, BSN Entered By: Deon Pilling on 01/10/2021 16:21:01 -------------------------------------------------------------------------------- Pain Assessment Details Patient Name: Date of Service: Jocelyn Bauer 01/10/2021 1:15 PM Medical Record Number: [id number] Patient Account Number: [id number] Date of Birth/Sex: Treating RN: 03-Aug-1928 (88 y.o. Jocelyn Bauer Primary Care Jamarr Treinen: Jerlyn Ly Other Clinician: Referring Aaleyah Witherow: Treating Dorcus Riga/Extender: Candise Che  Weeks in Treatment: 0 Active Problems Location of Pain Severity and Description of Pain Patient Has Paino No Site Locations Rate the pain. Current Pain Level: 0 Pain Management and Medication Current Pain Management: Medication: No Cold Application: No Rest: No Massage: No Activity: No T.E.N.S.: No Heat Application: No Leg drop or elevation: No Is the Current Pain Management Adequate: Adequate How does your wound impact your activities of daily livingo Sleep: No Bathing: No Appetite: No Relationship With Others: No Bladder Continence: No Emotions: No Bowel Continence: No Work: No Toileting: No Drive: No Dressing: No Hobbies: No Electronic Signature(s) Signed: 01/10/2021 5:43:10 PM By: Baruch Gouty RN, BSN Entered By: Baruch Gouty on 01/10/2021 14:02:52 -------------------------------------------------------------------------------- Patient/Caregiver Education Details Patient Name: Date of Service: Jocelyn Bauer 1/4/2023andnbsp1:15 PM Medical Record Number: [id number] Patient Account Number: [id number] Date of Birth/Gender: Treating RN: [birth year] (88 y.o. Jocelyn Bauer Primary Care Physician: Jerlyn Ly Other Clinician: Referring Physician: Treating Physician/Extender: Eli Hose in Treatment: 0 Education Assessment Education Provided To: Patient and Caregiver daughter Education Topics Provided [place]: o Handouts: Welcome T [facility] [place] o Methods: Explain/Verbal Responses: Reinforcements needed Electronic Signature(s) Signed: 01/10/2021 5:44:25 PM By: Deon Pilling RN, BSN Entered By: Deon Pilling on 01/10/2021 14:30:03 -------------------------------------------------------------------------------- Wound Assessment Details Patient Name: Date of Service: Jocelyn Blake R. 01/10/2021 1:15 PM Medical Record Number: [id number] Patient Account Number: [id number] Date of Birth/Sex: Treating RN: 10/24/1928 (88 y.o. Jocelyn Bauer Primary Care Citlaly Camplin: Jerlyn Ly Other Clinician: Referring Thorin Starner: Treating Soundra Lampley/Extender: Candise Che Weeks in Treatment: 0 Wound Status Wound Number: 1 Primary Etiology: Dehisced Wound Wound Location: Right, Lateral Malleolus Wound Status: Open Wounding Event: Gradually Appeared Comorbid Cataracts, Hypertension, Rheumatoid Arthritis, History: Osteoarthritis Date Acquired: 11/07/2020 Weeks Of Treatment: 0 Clustered Wound: No Photos Wound Measurements Length: (cm) 0.2 Width: (cm) 0.2 Depth: (cm) 0.2 Area: (cm) 0.031 Volume: (cm) 0.006 % Reduction in Area: 0% % Reduction in Volume: 0% Epithelialization: None Tunneling: No Undermining: Yes Starting Position (o'clock): 12 Ending Position (o'clock): 12 Maximum Distance: (cm) 0.2 Wound Description Classification: Full Thickness With Exposed Support Structure Wound Margin: Distinct, outline attached Exudate Amount: Medium Exudate Type: Serosanguineous Exudate Color: red, brown Wound Bed Granulation Amount: None Present (0%) Necrotic Amount: None Present (0%) s Foul Odor After Cleansing: No Slough/Fibrino No Exposed Structure Fascia Exposed: No Fat Layer (Subcutaneous  Tissue) Exposed: No Tendon Exposed: No Muscle Exposed: No Joint Exposed: No Bone Exposed: No Assessment Notes hardware exposed. Treatment Notes Wound #1 (Malleolus) Wound Laterality: Right, Lateral Cleanser Soap and Water Discharge Instruction: May shower and wash wound with dial antibacterial soap and water prior to dressing change. Wound Cleanser Discharge Instruction: Cleanse the wound with wound cleanser prior to applying a clean dressing using gauze sponges, not tissue or cotton balls. Peri-Wound Care Topical Primary Dressing KerraCel Ag Gelling Fiber Dressing, 4x5 in (silver alginate) Discharge Instruction: Apply silver alginate to wound bed as instructed Secondary Dressing bandaid Discharge Instruction: cover primary with a bandaid. Secured With Compression Wrap Compression Stockings Environmental education officer) Signed: 01/10/2021 5:43:10 PM By: Baruch Gouty RN, BSN Signed: 01/10/2021 5:44:25 PM By: Deon Pilling RN, BSN Entered By: Deon Pilling on 01/10/2021 14:03:18 -------------------------------------------------------------------------------- Vitals Details Patient Name: Date of Service: Jocelyn Blake R. 01/10/2021 1:15 PM Medical Record Number: [id number] Patient Account Number: [id number] Date of Birth/Sex: Treating RN: 1928-01-10 (88 y.o. Jocelyn Bauer Primary Care Saranne Crislip:  Jerlyn Ly Other Clinician: Referring Khalessi Blough: Treating Espen Bethel/Extender: Candise Che Weeks in Treatment: 0 Vital Signs Time Taken: 13:28 Temperature (F): 98.3 Height (in): 61 Pulse (bpm): 67 Source: Stated Respiratory Rate (breaths/min): 18 Weight (lbs): 137 Blood Pressure (mmHg): 159/66 Source: Stated Reference Range: 80 - 120 mg / dl Body Mass Index (BMI): 25.9 Electronic Signature(s) Signed: 01/10/2021 5:43:10 PM By: Baruch Gouty RN, BSN Entered By: Baruch Gouty on 01/10/2021 13:30:26

## 2021-01-10 NOTE — Progress Notes (Signed)
Jocelyn, Bauer ([id number]) Visit Report for 01/10/2021 Abuse/Suicide Risk Screen Details Patient Name: Date of Service: Jocelyn Bauer 01/10/2021 1:15 PM Medical Record Number: [id number] Patient Account Number: [id number] Date of Birth/Sex: Treating RN: 08-06-1928 (88 y.o. Elam Dutch Primary Care Inez Rosato: Jerlyn Ly Other Clinician: Referring Quynh Basso: Treating Antuane Eastridge/Extender: Candise Che Weeks in Treatment: 0 Abuse/Suicide Risk Screen Items Answer ABUSE RISK SCREEN: Has anyone close to you tried to hurt or harm you recentlyo No Do you feel uncomfortable with anyone in your familyo No Has anyone forced you do things that you didnt want to doo No Electronic Signature(s) Signed: 01/10/2021 5:43:10 PM By: Baruch Gouty RN, BSN Entered By: Baruch Gouty on 01/10/2021 13:46:40 -------------------------------------------------------------------------------- Activities of Daily Living Details Patient Name: Date of Service: Jocelyn Bauer 01/10/2021 1:15 PM Medical Record Number: [id number] Patient Account Number: [id number] Date of Birth/Sex: Treating RN: 20-Dec-1928 (87 y.o. Elam Dutch Primary Care Latosha Gaylord: Jerlyn Ly Other Clinician: Referring Frankee Gritz: Treating Ervin Hensley/Extender: Eli Hose in Treatment: 0 Activities of Daily Living Items Answer Activities of Daily Living (Please select one for each item) Drive Automobile Not Able T Medications ake Completely Able Use T elephone Completely Able Care for Appearance Completely Able Use T oilet Completely Able Bath / Shower Completely Able Dress Self Completely Able Feed Self Completely Able Walk Need Assistance Get In / Out Bed Completely Able Housework Not Able Prepare Meals Not Able Handle Money Need Assistance Shop for Self Need Assistance Electronic Signature(s) Signed: 01/10/2021 5:43:10 PM By: Baruch Gouty RN, BSN Entered By: Baruch Gouty on 01/10/2021 13:47:06 -------------------------------------------------------------------------------- Education Screening Details Patient Name: Date of Service: Jocelyn Blake R. 01/10/2021 1:15 PM Medical Record Number: [id number] Patient Account Number: [id number] Date of Birth/Sex: Treating RN: 13-Oct-1928 (88 y.o. Elam Dutch Primary Care Ramsha Lonigro: Jerlyn Ly Other Clinician: Referring Jady Braggs: Treating Kerryann Allaire/Extender: Eli Hose in Treatment: 0 Primary Learner Assessed: Patient Learning Preferences/Education Level/Primary Language Learning Preference: Explanation, Demonstration, Printed Material Highest Education Level: High School Preferred Language: English Cognitive Barrier Language Barrier: No Translator Needed: No Memory Deficit: No Emotional Barrier: No Cultural/Religious Beliefs Affecting Medical Care: No Physical Barrier Impaired Vision: Yes Glasses, reading Impaired Hearing: No Decreased Hand dexterity: No Knowledge/Comprehension Knowledge Level: High Comprehension Level: High Ability to understand written instructions: High Ability to understand verbal instructions: High Motivation Anxiety Level: Calm Cooperation: Cooperative Education Importance: Acknowledges Need Interest in Health Problems: Asks Questions Perception: Coherent Willingness to Engage in Self-Management High Activities: Readiness to Engage in Self-Management High Activities: Electronic Signature(s) Signed: 01/10/2021 5:43:10 PM By: Baruch Gouty RN, BSN Entered By: Baruch Gouty on 01/10/2021 13:47:30 -------------------------------------------------------------------------------- Fall Risk Assessment Details Patient Name: Date of Service: Jocelyn Blake R. 01/10/2021 1:15 PM Medical Record Number: [id number] Patient Account Number: [id number] Date of Birth/Sex: Treating RN: July 20, 1928 (88 y.o. Elam Dutch Primary Care Felisa Zechman:  Jerlyn Ly Other Clinician: Referring Aristides Luckey: Treating Louann Hopson/Extender: Eli Hose in Treatment: 0 Fall Risk Assessment Items Have you had 2 or more falls in the last 12 monthso 0 No Have you had any fall that resulted in injury in the last 12 monthso 0 No FALLS RISK SCREEN History of falling - immediate or within 3 months 0 No Secondary diagnosis (Do you have 2 or more medical diagnoseso) 0 No Ambulatory aid None/bed rest/wheelchair/nurse 0 Yes Crutches/cane/walker 0 No Furniture 30  Yes Intravenous therapy Access/Saline/Heparin Lock 0 No Gait/Transferring Normal/ bed rest/ wheelchair 0 No Weak (short steps with or without shuffle, stooped but able to lift head while walking, may seek 10 Yes support from furniture) Impaired (short steps with shuffle, may have difficulty arising from chair, head down, impaired 0 No balance) Mental Status Oriented to own ability 0 Yes Electronic Signature(s) Signed: 01/10/2021 5:43:10 PM By: Baruch Gouty RN, BSN Entered By: Baruch Gouty on 01/10/2021 13:47:44 -------------------------------------------------------------------------------- Foot Assessment Details Patient Name: Date of Service: Jocelyn Blake R. 01/10/2021 1:15 PM Medical Record Number: [id number] Patient Account Number: [id number] Date of Birth/Sex: Treating RN: Feb 28, 1928 (88 y.o. Elam Dutch Primary Care Pearlene Teat: Jerlyn Ly Other Clinician: Referring Hooria Gasparini: Treating Marleen Moret/Extender: Candise Che Weeks in Treatment: 0 Foot Assessment Items Site Locations + = Sensation present, - = Sensation absent, C = Callus, U = Ulcer R = Redness, W = Warmth, M = Maceration, PU = Pre-ulcerative lesion F = Fissure, S = Swelling, D = Dryness Assessment Right: Left: Other Deformity: No No Prior Foot Ulcer: No No Prior Amputation: No No Charcot Joint: No No Ambulatory Status: Ambulatory With Help Assistance  Device: Wheelchair Gait: Administrator, arts) Signed: 01/10/2021 5:43:10 PM By: Baruch Gouty RN, BSN Entered By: Baruch Gouty on 01/10/2021 13:52:35 -------------------------------------------------------------------------------- Nutrition Risk Screening Details Patient Name: Date of Service: Jocelyn Bauer 01/10/2021 1:15 PM Medical Record Number: [id number] Patient Account Number: [id number] Date of Birth/Sex: Treating RN: 02-18-1928 (88 y.o. Elam Dutch Primary Care Lucky Alverson: Jerlyn Ly Other Clinician: Referring Helena Sardo: Treating Machell Wirthlin/Extender: Candise Che Weeks in Treatment: 0 Height (in): 61 Weight (lbs): 137 Body Mass Index (BMI): 25.9 Nutrition Risk Screening Items Score Screening NUTRITION RISK SCREEN: I have an illness or condition that made me change the kind and/or amount of food I eat 2 Yes I eat fewer than two meals per day 0 No I eat few fruits and vegetables, or milk products 0 No I have three or more drinks of beer, liquor or wine almost every day 0 No I have tooth or mouth problems that make it hard for me to eat 0 No I don't always have enough money to buy the food I need 0 No I eat alone most of the time 0 No I take three or more different prescribed or over-the-counter drugs a day 1 Yes Without wanting to, I have lost or gained 10 pounds in the last six months 0 No I am not always physically able to shop, cook and/or feed myself 0 No Nutrition Protocols Good Risk Protocol Moderate Risk Protocol 0 Provide education on nutrition High Risk Proctocol Risk Level: Moderate Risk Score: 3 Electronic Signature(s) Signed: 01/10/2021 5:43:10 PM By: Baruch Gouty RN, BSN Entered By: Baruch Gouty on 01/10/2021 13:48:02

## 2021-01-15 DIAGNOSIS — T84197A Other mechanical complication of internal fixation device of bone of left lower leg, initial encounter: Secondary | ICD-10-CM | POA: Diagnosis not present

## 2021-01-15 DIAGNOSIS — S82852D Displaced trimalleolar fracture of left lower leg, subsequent encounter for closed fracture with routine healing: Secondary | ICD-10-CM | POA: Diagnosis not present

## 2021-01-15 DIAGNOSIS — S82851D Displaced trimalleolar fracture of right lower leg, subsequent encounter for closed fracture with routine healing: Secondary | ICD-10-CM | POA: Diagnosis not present

## 2021-01-17 DIAGNOSIS — M810 Age-related osteoporosis without current pathological fracture: Secondary | ICD-10-CM | POA: Diagnosis not present

## 2021-01-19 ENCOUNTER — Encounter (HOSPITAL_COMMUNITY): Payer: Self-pay | Admitting: Orthopedic Surgery

## 2021-01-19 ENCOUNTER — Other Ambulatory Visit: Payer: Self-pay

## 2021-01-19 NOTE — Progress Notes (Signed)
Spoke with pt for pre-op call. Pt denies cardiac history or diabetes. Pt is treated for HTN.   Pt's surgery is scheduled as ambulatory so no Covid test is required prior to surgery.

## 2021-01-21 NOTE — H&P (Signed)
Orthopaedic Trauma Service (OTS) Consult   Patient ID: Jocelyn Bauer MRN: [id number] DOB/AGE: Sep 05, 1928 88 y.o.   HPI: Jocelyn Bauer is an 88 y.o. female s/p remote repair of  R ankle fracture. She has developed a punctate wound over her fibula incision and it has been there about 2 months. She has tried various methods to get it to heal without success.  She was referred to the wound care center but upon evaluation it was noted that the wound communicated with the hardware. She was referred to our office for evaluation. Pt presents today for removal of hardware and debridement of her fibula as this is the standard of care for exposed hardware. She was started on empiric abx at our office give her history of numerous total joints   Past Medical History:  Diagnosis Date   Anxiety    HX OF PANIC ATTACKS   Arthritis    RA   Chronic neck and back Bauer    Chronic Bauer    Chronic, continuous use of opioids 07/28/2019   Elbow locking    due to RA- elbow with limited extension   GERD (gastroesophageal reflux disease)    Headache    Hyperlipidemia    Hypertension    Hypothyroidism    Osteoarthritis    Osteoporosis    Pneumonia    Shortness of breath    occas   UTI (urinary tract infection)     Past Surgical History:  Procedure Laterality Date   CATARACT EXTRACTION Bilateral    COLONOSCOPY     ELBOW SURGERY Right    ELBOW SURGERY Left    ESOPHAGOGASTRODUODENOSCOPY     HARDWARE REMOVAL Right 07/13/2015   Procedure:  WITH HARDWARE REMOVAL REPAIR AS NECESSARY;  Surgeon: Roseanne Kaufman, MD;  Location: [facility];  Service: Orthopedics;  Laterality: Right;   JOINT REPLACEMENT  1998   left hip replacement and bilateral elbow replacements  about 15 yrs ago   ORIF ANKLE FRACTURE Bilateral 07/27/2019   Procedure: OPEN REDUCTION INTERNAL FIXATION (ORIF) ANKLE FRACTURE;  Surgeon: Altamese Hillsboro, MD;  Location: [facility];  Service: Orthopedics;  Laterality: Bilateral;   thyroidectomy      TOTAL ELBOW ARTHROPLASTY Right 07/13/2015   Procedure: RIGHT ELBOW REVISION TOTAL ELBOW ARTHROPLASTY ;  Surgeon: Roseanne Kaufman, MD;  Location: [facility];  Service: Orthopedics;  Laterality: Right;   TOTAL HIP ARTHROPLASTY     TOTAL HIP REVISION  03/11/2011   Procedure: TOTAL HIP REVISION;  Surgeon: [name], MD;  Location: [facility];  Service: Orthopedics;  Laterality: Left;   TRICEPS TENDON REPAIR Right 07/13/2015   Procedure:  TRICEP RECONSTRUCTION REPAIR WITH ALLOGRAFT, ULNAR NERVE NEUROLYSIS;  Surgeon: Roseanne Kaufman, MD;  Location: [facility];  Service: Orthopedics;  Laterality: Right;    Family History  Problem Relation Age of Onset   Heart attack Mother    Prostate cancer Father    Lung disease Brother    Heart attack Sister    COPD Sister    Cancer Sister        She does not know which type of cancer    Social History:  reports that she has quit smoking. She has never used smokeless tobacco. She reports that she does not drink alcohol and does not use drugs.  Allergies:  Allergies  Allergen Reactions   Contrast Media [Iodinated Contrast Media] Other (See Comments)    Itching.( 13 hr pre medication regimen worked fine. )  Tdap [Tetanus-Diphth-Acell Pertussis] Hives   Streptococcus (Diplococcus) Pneumoniae [Streptococci] Other (See Comments)    Shortness of breath    Medications: I have reviewed the patient's current medications. Current Meds  Medication Sig   acetaminophen (TYLENOL) 325 MG tablet Take 2 tablets (650 mg total) by mouth 3 (three) times daily. (Patient taking differently: Take 650 mg by mouth every 6 (six) hours as needed for moderate Bauer.)   amLODipine (NORVASC) 5 MG tablet Take 5 mg by mouth every morning.   ascorbic acid (VITAMIN C) 500 MG tablet Take 1 tablet (500 mg total) by mouth daily.   aspirin EC 81 MG tablet Take 81 mg by mouth daily. Swallow whole.   benazepril (LOTENSIN) 20 MG tablet Take 20 mg by mouth every morning.   bisacodyl (DULCOLAX) 5 MG EC  tablet Take 1 tablet (5 mg total) by mouth daily as needed for moderate constipation.   cholecalciferol (VITAMIN D3) 25 MCG (1000 UNIT) tablet Take 1,000 Units by mouth daily.   docusate sodium (COLACE) 100 MG capsule Take 2 capsules (200 mg total) by mouth 2 (two) times daily. (Patient taking differently: Take 200 mg by mouth daily as needed for moderate constipation.)   fentaNYL (DURAGESIC) 50 MCG/HR Place 1 patch onto the skin every 3 (three) days.   folic acid (FOLVITE) 1 MG tablet Take 1 mg by mouth daily.   furosemide (LASIX) 20 MG tablet Take 20 mg by mouth every other day.   levothyroxine (SYNTHROID) 125 MCG tablet Take 125 mcg by mouth daily before breakfast.   LORazepam (ATIVAN) 0.5 MG tablet Take 0.5 mg by mouth 2 (two) times daily as needed for anxiety.   methotrexate 50 MG/2ML injection every Wednesday. 0.9 mL - takes orally in juice   omeprazole (PRILOSEC OTC) 20 MG tablet Take 20 mg by mouth every other day.   oxyCODONE-acetaminophen (PERCOCET) 10-325 MG tablet Take 1 tablet by mouth every 6 (six) hours as needed for Bauer.   predniSONE (DELTASONE) 5 MG tablet Take 1 tablet (5 mg total) by mouth daily.   vitamin B-12 (CYANOCOBALAMIN) 1000 MCG tablet Take 1,000 mcg by mouth daily.     No results found for this or any previous visit (from the past 48 hour(s)).  No results found.  Intake/Output    None      Review of Systems  Constitutional:  Negative for chills and fever.  Respiratory:  Negative for shortness of breath.   Cardiovascular:  Negative for chest Bauer and palpitations.  Gastrointestinal:  Negative for nausea and vomiting.  There were no vitals taken for this visit. Physical Exam Constitutional:      General: She is not in acute distress.    Appearance: Normal appearance.  Cardiovascular:     Rate and Rhythm: Normal rate.  Pulmonary:     Effort: Pulmonary effort is normal.  Musculoskeletal:     Comments: Right lower extremity   Punctate wound distal  aspect of fibular incision  Communictates with fibular hardware No active purulence  Pitting edema  Motor and sensory functions intact Ext warm  + DP pulse No diffuse cellulitis noted   Medial mall incision well healed, no wounds, no erythema   Neurological:     Mental Status: She is alert.  Psychiatric:        Behavior: Behavior is cooperative.     Assessment/Plan:  88 y/o female with exposed fibular hardware R ankle   -exposed hardware R fibula  OR for [facility] R fibula and debridment  Anticipate 4 weeks abx post op. Will adjust according to C&S   WBAT post op   Out pt procedure   - Dispo:  OR for [facility] R fibula   Dc home after surgery     Jari Pigg, PA-C [phone] (C) 01/21/2021, 9:22 PM  [facility] [place] [address] [phone] Jenetta [phone] (F)    After 5pm and on the weekends please log on to Amion, go to orthopaedics and the look under the [facility] Call for the provider(s) on call. You can also call our office at [phone] and then follow the prompts to be connected to the call team.

## 2021-01-22 ENCOUNTER — Other Ambulatory Visit (HOSPITAL_COMMUNITY): Payer: Self-pay

## 2021-01-22 ENCOUNTER — Encounter (HOSPITAL_COMMUNITY): Payer: Self-pay | Admitting: Orthopedic Surgery

## 2021-01-22 ENCOUNTER — Other Ambulatory Visit: Payer: Self-pay

## 2021-01-22 ENCOUNTER — Ambulatory Visit (HOSPITAL_COMMUNITY): Payer: Medicare Other

## 2021-01-22 ENCOUNTER — Ambulatory Visit (HOSPITAL_COMMUNITY): Payer: Medicare Other | Admitting: Anesthesiology

## 2021-01-22 ENCOUNTER — Ambulatory Visit (HOSPITAL_COMMUNITY)
Admission: RE | Admit: 2021-01-22 | Discharge: 2021-01-22 | Disposition: A | Discharge status: Home / Self Care | Payer: Medicare Other | Attending: Orthopedic Surgery | Admitting: Orthopedic Surgery

## 2021-01-22 ENCOUNTER — Encounter (HOSPITAL_COMMUNITY)
Admission: RE | Disposition: A | Discharge status: Home / Self Care | Payer: Self-pay | Source: Home / Self Care | Attending: Orthopedic Surgery

## 2021-01-22 DIAGNOSIS — M86469 Chronic osteomyelitis with draining sinus, unspecified tibia and fibula: Secondary | ICD-10-CM | POA: Diagnosis not present

## 2021-01-22 DIAGNOSIS — Z87891 Personal history of nicotine dependence: Secondary | ICD-10-CM | POA: Insufficient documentation

## 2021-01-22 DIAGNOSIS — M869 Osteomyelitis, unspecified: Secondary | ICD-10-CM | POA: Diagnosis not present

## 2021-01-22 DIAGNOSIS — Z419 Encounter for procedure for purposes other than remedying health state, unspecified: Secondary | ICD-10-CM

## 2021-01-22 DIAGNOSIS — X58XXXA Exposure to other specified factors, initial encounter: Secondary | ICD-10-CM | POA: Diagnosis not present

## 2021-01-22 DIAGNOSIS — Z472 Encounter for removal of internal fixation device: Secondary | ICD-10-CM | POA: Diagnosis not present

## 2021-01-22 DIAGNOSIS — E039 Hypothyroidism, unspecified: Secondary | ICD-10-CM | POA: Diagnosis not present

## 2021-01-22 DIAGNOSIS — T847XXA Infection and inflammatory reaction due to other internal orthopedic prosthetic devices, implants and grafts, initial encounter: Secondary | ICD-10-CM | POA: Diagnosis not present

## 2021-01-22 DIAGNOSIS — I10 Essential (primary) hypertension: Secondary | ICD-10-CM | POA: Diagnosis not present

## 2021-01-22 DIAGNOSIS — T84196A Other mechanical complication of internal fixation device of bone of right lower leg, initial encounter: Secondary | ICD-10-CM | POA: Diagnosis not present

## 2021-01-22 DIAGNOSIS — T859XXA Unspecified complication of internal prosthetic device, implant and graft, initial encounter: Secondary | ICD-10-CM | POA: Insufficient documentation

## 2021-01-22 DIAGNOSIS — M86461 Chronic osteomyelitis with draining sinus, right tibia and fibula: Secondary | ICD-10-CM | POA: Diagnosis not present

## 2021-01-22 HISTORY — DX: Pneumonia, unspecified organism: J18.9

## 2021-01-22 HISTORY — PX: HARDWARE REMOVAL: SHX979

## 2021-01-22 LAB — COMPREHENSIVE METABOLIC PANEL
ALT: 13 U/L (ref 0–44)
AST: 16 U/L (ref 15–41)
Albumin: 2.9 g/dL — ABNORMAL LOW (ref 3.5–5.0)
Alkaline Phosphatase: 63 U/L (ref 38–126)
Anion gap: 12 (ref 5–15)
BUN: 12 mg/dL (ref 8–23)
CO2: 28 mmol/L (ref 22–32)
Calcium: 8 mg/dL — ABNORMAL LOW (ref 8.9–10.3)
Chloride: 98 mmol/L (ref 98–111)
Creatinine, Ser: 0.74 mg/dL (ref 0.44–1.00)
GFR, Estimated: >60 mL/min (ref >60)
Glucose, Bld: 110 mg/dL — ABNORMAL HIGH (ref 70–99)
Potassium: 2.4 mmol/L — CL (ref 3.5–5.1)
Sodium: 138 mmol/L (ref 135–145)
Total Bilirubin: 0.3 mg/dL (ref 0.3–1.2)
Total Protein: 6.7 g/dL (ref 6.5–8.1)

## 2021-01-22 LAB — CBC WITH DIFFERENTIAL/PLATELET
Abs Immature Granulocytes: 0.09 10*3/uL — ABNORMAL HIGH (ref 0.00–0.07)
Basophils Absolute: 0.1 10*3/uL (ref 0.0–0.1)
Basophils Relative: 1 %
Eosinophils Absolute: 0.1 10*3/uL (ref 0.0–0.5)
Eosinophils Relative: 2 %
HCT: 32.7 % — ABNORMAL LOW (ref 36.0–46.0)
Hemoglobin: 10.6 g/dL — ABNORMAL LOW (ref 12.0–15.0)
Immature Granulocytes: 1 %
Lymphocytes Relative: 15 %
Lymphs Abs: 1 10*3/uL (ref 0.7–4.0)
MCH: 29 pg (ref 26.0–34.0)
MCHC: 32.4 g/dL (ref 30.0–36.0)
MCV: 89.6 fL (ref 80.0–100.0)
Monocytes Absolute: 1 10*3/uL (ref 0.1–1.0)
Monocytes Relative: 14 %
Neutro Abs: 4.7 10*3/uL (ref 1.7–7.7)
Neutrophils Relative %: 67 %
Platelets: 450 10*3/uL — ABNORMAL HIGH (ref 150–400)
RBC: 3.65 MIL/uL — ABNORMAL LOW (ref 3.87–5.11)
RDW: 16 % — ABNORMAL HIGH (ref 11.5–15.5)
WBC: 6.9 10*3/uL (ref 4.0–10.5)
nRBC: 0 % (ref 0.0–0.2)

## 2021-01-22 LAB — C-REACTIVE PROTEIN: CRP: 11 mg/dL — ABNORMAL HIGH (ref <1.0)

## 2021-01-22 LAB — SURGICAL PCR SCREEN
MRSA, PCR: NEGATIVE
Staphylococcus aureus: NEGATIVE

## 2021-01-22 LAB — SEDIMENTATION RATE: Sed Rate: 63 mm/hr — ABNORMAL HIGH (ref 0–22)

## 2021-01-22 SURGERY — REMOVAL, HARDWARE
Anesthesia: Monitor Anesthesia Care | Site: Ankle | Laterality: Right

## 2021-01-22 MED ORDER — ACETAMINOPHEN 500 MG PO TABS
1000.0000 mg | ORAL_TABLET | Freq: Once | ORAL | Status: AC
  Administered 2021-01-22: 1000 mg via ORAL
  Filled 2021-01-22: qty 2

## 2021-01-22 MED ORDER — FENTANYL CITRATE (PF) 250 MCG/5ML IJ SOLN
INTRAMUSCULAR | Status: DC | PRN
Start: 2021-01-22 — End: 2021-01-22
  Administered 2021-01-22 (×2): 50 ug via INTRAVENOUS

## 2021-01-22 MED ORDER — CEFAZOLIN SODIUM-DEXTROSE 2-3 GM-%(50ML) IV SOLR
INTRAVENOUS | Status: DC | PRN
  Administered 2021-01-22: 2 g via INTRAVENOUS

## 2021-01-22 MED ORDER — POVIDONE-IODINE 10 % EX SWAB
2.0000 "application " | Freq: Once | CUTANEOUS | Status: AC
  Administered 2021-01-22: 2 via TOPICAL

## 2021-01-22 MED ORDER — LINEZOLID 600 MG PO TABS
600.0000 mg | ORAL_TABLET | Freq: Two times a day (BID) | ORAL | 0 refills | Status: DC
  Filled 2021-01-22: qty 28, 14d supply, fill #0

## 2021-01-22 MED ORDER — PROPOFOL 1000 MG/100ML IV EMUL
INTRAVENOUS | Status: AC
  Filled 2021-01-22: qty 100

## 2021-01-22 MED ORDER — FENTANYL CITRATE (PF) 250 MCG/5ML IJ SOLN
INTRAMUSCULAR | Status: AC
  Filled 2021-01-22: qty 5

## 2021-01-22 MED ORDER — PROPOFOL 10 MG/ML IV BOLUS
INTRAVENOUS | Status: AC
  Filled 2021-01-22: qty 20

## 2021-01-22 MED ORDER — LIDOCAINE 2% (20 MG/ML) 5 ML SYRINGE
INTRAMUSCULAR | Status: DC | PRN
  Administered 2021-01-22: 30 mg via INTRAVENOUS

## 2021-01-22 MED ORDER — 0.9 % SODIUM CHLORIDE (POUR BTL) OPTIME
TOPICAL | Status: DC | PRN
  Administered 2021-01-22: 1000 mL

## 2021-01-22 MED ORDER — CHLORHEXIDINE GLUCONATE 4 % EX LIQD: 60.0000 mL | Freq: Once | CUTANEOUS | Status: DC

## 2021-01-22 MED ORDER — ACETAMINOPHEN 500 MG PO TABS
ORAL_TABLET | ORAL | Status: AC
  Filled 2021-01-22: qty 2

## 2021-01-22 MED ORDER — CEFAZOLIN SODIUM-DEXTROSE 2-4 GM/100ML-% IV SOLN
INTRAVENOUS | Status: AC
  Filled 2021-01-22: qty 100

## 2021-01-22 MED ORDER — FENTANYL CITRATE (PF) 100 MCG/2ML IJ SOLN
INTRAMUSCULAR | Status: AC
  Administered 2021-01-22: 50 ug via INTRAVENOUS
  Filled 2021-01-22: qty 2

## 2021-01-22 MED ORDER — ACETAMINOPHEN 500 MG PO TABS
1000.0000 mg | ORAL_TABLET | Freq: Once | ORAL | Status: AC
  Administered 2021-01-22: 1000 mg via ORAL

## 2021-01-22 MED ORDER — PROPOFOL 500 MG/50ML IV EMUL
INTRAVENOUS | Status: DC | PRN
  Administered 2021-01-22: 30 ug/kg/min via INTRAVENOUS

## 2021-01-22 MED ORDER — CHLORHEXIDINE GLUCONATE 0.12 % MT SOLN
OROMUCOSAL | Status: AC
  Administered 2021-01-22: 15 mL
  Filled 2021-01-22: qty 15

## 2021-01-22 MED ORDER — POTASSIUM CHLORIDE 10 MEQ/100ML IV SOLN
10.0000 meq | INTRAVENOUS | Status: AC
  Administered 2021-01-22 (×5): 10 meq via INTRAVENOUS
  Filled 2021-01-22: qty 100

## 2021-01-22 MED ORDER — FENTANYL CITRATE (PF) 100 MCG/2ML IJ SOLN: 50.0000 ug | Freq: Once | INTRAMUSCULAR | Status: AC

## 2021-01-22 MED ORDER — CIPROFLOXACIN HCL 500 MG PO TABS
500.0000 mg | ORAL_TABLET | Freq: Two times a day (BID) | ORAL | Status: DC
  Administered 2021-01-22: 500 mg via ORAL
  Filled 2021-01-22: qty 1

## 2021-01-22 MED ORDER — ROPIVACAINE HCL 5 MG/ML IJ SOLN
INTRAMUSCULAR | Status: DC | PRN
Start: 2021-01-22 — End: 2021-01-22
  Administered 2021-01-22: 30 mL via PERINEURAL

## 2021-01-22 MED ORDER — LINEZOLID 600 MG PO TABS
600.0000 mg | ORAL_TABLET | Freq: Two times a day (BID) | ORAL | Status: DC
  Administered 2021-01-22: 600 mg via ORAL
  Filled 2021-01-22: qty 1

## 2021-01-22 MED ORDER — PROPOFOL 10 MG/ML IV BOLUS
INTRAVENOUS | Status: DC | PRN
  Administered 2021-01-22: 20 mg via INTRAVENOUS

## 2021-01-22 MED ORDER — CIPROFLOXACIN HCL 500 MG PO TABS
500.0000 mg | ORAL_TABLET | Freq: Two times a day (BID) | ORAL | 0 refills | Status: DC
  Filled 2021-01-22: qty 28, 14d supply, fill #0

## 2021-01-22 MED ORDER — LACTATED RINGERS IV SOLN: INTRAVENOUS | Status: DC

## 2021-01-22 SURGICAL SUPPLY — 63 items
BAG COUNTER SPONGE SURGICOUNT (BAG) ×3 IMPLANT
BAG SPNG CNTER NS LX DISP (BAG) ×1
BANDAGE ESMARK 6X9 LF (GAUZE/BANDAGES/DRESSINGS) ×2 IMPLANT
BNDG CMPR 9X6 STRL LF SNTH (GAUZE/BANDAGES/DRESSINGS) ×1
BNDG COHESIVE 6X5 TAN STRL LF (GAUZE/BANDAGES/DRESSINGS) ×3 IMPLANT
BNDG ELASTIC 4X5.8 VLCR STR LF (GAUZE/BANDAGES/DRESSINGS) ×2 IMPLANT
BNDG ELASTIC 6X5.8 VLCR STR LF (GAUZE/BANDAGES/DRESSINGS) ×3 IMPLANT
BNDG ESMARK 6X9 LF (GAUZE/BANDAGES/DRESSINGS) ×2
BNDG GAUZE ELAST 4 BULKY (GAUZE/BANDAGES/DRESSINGS) ×6 IMPLANT
BRUSH SCRUB EZ PLAIN DRY (MISCELLANEOUS) ×4 IMPLANT
COVER SURGICAL LIGHT HANDLE (MISCELLANEOUS) ×4 IMPLANT
CUFF TOURN SGL QUICK 18X4 (TOURNIQUET CUFF) IMPLANT
CUFF TOURN SGL QUICK 24 (TOURNIQUET CUFF)
CUFF TOURN SGL QUICK 34 (TOURNIQUET CUFF)
CUFF TRNQT CYL 24X4X16.5-23 (TOURNIQUET CUFF) IMPLANT
CUFF TRNQT CYL 34X4.125X (TOURNIQUET CUFF) IMPLANT
DRAPE C-ARM 42X72 X-RAY (DRAPES) IMPLANT
DRAPE C-ARMOR (DRAPES) ×2 IMPLANT
DRAPE U-SHAPE 47X51 STRL (DRAPES) ×3 IMPLANT
DRIVER STRM ST T10 (ORTHOPEDIC DISPOSABLE SUPPLIES) ×1 IMPLANT
DRSG ADAPTIC 3X8 NADH LF (GAUZE/BANDAGES/DRESSINGS) ×2 IMPLANT
ELECT REM PT RETURN 9FT ADLT (ELECTROSURGICAL) ×2
ELECTRODE REM PT RTRN 9FT ADLT (ELECTROSURGICAL) ×1 IMPLANT
GAUZE SPONGE 4X4 12PLY STRL (GAUZE/BANDAGES/DRESSINGS) ×3 IMPLANT
GLOVE SRG 8 PF TXTR STRL LF DI (GLOVE) ×2 IMPLANT
GLOVE SURG ENC MOIS LTX SZ8 (GLOVE) ×3 IMPLANT
GLOVE SURG ORTHO LTX SZ7.5 (GLOVE) ×6 IMPLANT
GLOVE SURG UNDER POLY LF SZ7.5 (GLOVE) ×3 IMPLANT
GLOVE SURG UNDER POLY LF SZ8 (GLOVE) ×2
GOWN STRL REUS W/ TWL LRG LVL3 (GOWN DISPOSABLE) ×4 IMPLANT
GOWN STRL REUS W/ TWL XL LVL3 (GOWN DISPOSABLE) ×2 IMPLANT
GOWN STRL REUS W/TWL LRG LVL3 (GOWN DISPOSABLE) ×4
GOWN STRL REUS W/TWL XL LVL3 (GOWN DISPOSABLE) ×2
KIT BASIN OR (CUSTOM PROCEDURE TRAY) ×2 IMPLANT
KIT TURNOVER KIT B (KITS) ×2 IMPLANT
MANIFOLD NEPTUNE II (INSTRUMENTS) ×2 IMPLANT
NEEDLE 22X1 1/2 (OR ONLY) (NEEDLE) IMPLANT
NS IRRIG 1000ML POUR BTL (IV SOLUTION) ×3 IMPLANT
PACK ORTHO EXTREMITY (CUSTOM PROCEDURE TRAY) ×2 IMPLANT
PAD ARMBOARD 7.5X6 YLW CONV (MISCELLANEOUS) ×6 IMPLANT
PAD CAST 4YDX4 CTTN HI CHSV (CAST SUPPLIES) IMPLANT
PADDING CAST COTTON 4X4 STRL (CAST SUPPLIES) ×2
PADDING CAST COTTON 6X4 STRL (CAST SUPPLIES) ×7 IMPLANT
SPONGE T-LAP 18X18 ~~LOC~~+RFID (SPONGE) ×2 IMPLANT
STAPLER VISISTAT 35W (STAPLE) IMPLANT
STOCKINETTE IMPERVIOUS LG (DRAPES) ×3 IMPLANT
STRIP CLOSURE SKIN 1/2X4 (GAUZE/BANDAGES/DRESSINGS) IMPLANT
SUCTION FRAZIER HANDLE 10FR (MISCELLANEOUS)
SUCTION TUBE FRAZIER 10FR DISP (MISCELLANEOUS) IMPLANT
SUT ETHILON 2 0 FS 18 (SUTURE) IMPLANT
SUT ETHILON 3 0 PS 1 (SUTURE) ×1 IMPLANT
SUT PDS AB 2-0 CT1 27 (SUTURE) ×1 IMPLANT
SUT VIC AB 0 CT1 27 (SUTURE)
SUT VIC AB 0 CT1 27XBRD ANBCTR (SUTURE) IMPLANT
SUT VIC AB 2-0 CT1 27 (SUTURE)
SUT VIC AB 2-0 CT1 TAPERPNT 27 (SUTURE) IMPLANT
SYR CONTROL 10ML LL (SYRINGE) IMPLANT
TOWEL GREEN STERILE (TOWEL DISPOSABLE) ×6 IMPLANT
TOWEL GREEN STERILE FF (TOWEL DISPOSABLE) ×6 IMPLANT
TUBE CONNECTING 12X1/4 (SUCTIONS) ×2 IMPLANT
UNDERPAD 30X36 HEAVY ABSORB (UNDERPADS AND DIAPERS) ×3 IMPLANT
WATER STERILE IRR 1000ML POUR (IV SOLUTION) ×4 IMPLANT
YANKAUER SUCT BULB TIP NO VENT (SUCTIONS) ×3 IMPLANT

## 2021-01-22 NOTE — TOC Benefit Eligibility Note (Signed)
Patient Teacher, English as a foreign language completed.    The patient is currently admitted and upon discharge could be taking linezolid (Zyvox) 600 mg tablets.  The current 14 day co-pay is, $68.23.   The patient is insured through [place], [place] Patient Advocate Specialist [place] Patient Advocate Team Direct Number: [phone]  Fax: [phone]

## 2021-01-22 NOTE — Anesthesia Procedure Notes (Signed)
Procedure Name: Intubation Date/Time: 01/22/2021 9:14 AM Performed by: Mariea Clonts, CRNA Pre-anesthesia Checklist: Patient identified, Emergency Drugs available, Suction available and Patient being monitored

## 2021-01-22 NOTE — Discharge Instructions (Signed)
Orthopaedic Trauma Service Discharge Instructions   General Discharge Instructions   WEIGHT BEARING STATUS: Weightbearing as tolerated Right leg   RANGE OF MOTION/ACTIVITY: range of motion as tolerated right ankle.    Wound Care: daily wound care starting on 01/24/2021. See below   Discharge Wound Care Instructions  Do NOT apply any ointments, solutions or lotions to pin sites or surgical wounds.  These prevent needed drainage and even though solutions like hydrogen peroxide kill bacteria, they also damage cells lining the pin sites that help fight infection.  Applying lotions or ointments can keep the wounds moist and can cause them to breakdown and open up as well. This can increase the risk for infection. When in doubt call the office.  Surgical incisions should be dressed daily.  If any drainage is noted, use one layer of adaptic or Mepitel, then gauze, Kerlix, and an ace wrap.  [email or web address]  These dressing supplies should be available at local medical supply stores ([employer], [facility], etc). They are not usually carried at places like [facility], [facility], [employer], etc  Once the incision is completely dry and without drainage, it may be left open to air out.  Showering may begin 36-48 hours later.  Cleaning gently with soap and water.  Traumatic wounds should be dressed daily as well.    One layer of adaptic, gauze, Kerlix, then ace wrap.  The adaptic can be discontinued once the draining has ceased    If you have a wet to dry dressing: wet the gauze with saline the squeeze as much saline out so the gauze is moist (not soaking wet), place moistened gauze over wound, then place a dry gauze over the moist one, followed by Kerlix wrap, then ace wrap.   Diet: as you were eating previously.  Can  use over the counter stool softeners and bowel preparations, such as Miralax, to help with bowel movements.  Narcotics can be constipating.  Be sure to drink plenty of fluids  PAIN MEDICATION USE AND EXPECTATIONS  You have likely been given narcotic medications to help control your pain.  After a traumatic event that results in an fracture (broken bone) with or without surgery, it is ok to use narcotic pain medications to help control one's pain.  We understand that everyone responds to pain differently and each individual patient will be evaluated on a regular basis for the continued need for narcotic medications. Ideally, narcotic medication use should last no more than 6-8 weeks (coinciding with fracture healing).   As a patient it is your responsibility as well to monitor narcotic medication use and report the amount and frequency you use these medications when you come to your office visit.   We would also advise that if you are using narcotic medications, you should take a dose prior to therapy to maximize you participation.  IF YOU ARE ON NARCOTIC MEDICATIONS IT IS NOT PERMISSIBLE TO OPERATE A MOTOR VEHICLE (MOTORCYCLE/CAR/TRUCK/MOPED) OR HEAVY MACHINERY DO NOT MIX NARCOTICS WITH OTHER CNS (CENTRAL NERVOUS SYSTEM) DEPRESSANTS SUCH AS ALCOHOL   POST-OPERATIVE OPIOID TAPER INSTRUCTIONS: It is important to wean off of your opioid medication as soon as possible. If you do not need pain medication after your surgery it is ok to stop day one. Opioids include: Codeine, Hydrocodone(Norco, Vicodin), Oxycodone(Percocet, oxycontin) and hydromorphone amongst others.  Long term and even short term use of opiods can cause: Increased pain response Dependence Constipation Depression Respiratory depression And more.  Withdrawal symptoms can include Flu  like symptoms Nausea, vomiting And more Techniques to manage these symptoms Hydrate well Eat regular healthy meals Stay active Use relaxation  techniques(deep breathing, meditating, yoga) Do Not substitute Alcohol to help with tapering If you have been on opioids for less than two weeks and do not have pain than it is ok to stop all together.  Plan to wean off of opioids This plan should start within one week post op of your fracture surgery  Maintain the same interval or time between taking each dose and first decrease the dose.  Cut the total daily intake of opioids by one tablet each day Next start to increase the time between doses. The last dose that should be eliminated is the evening dose.    STOP SMOKING OR USING NICOTINE PRODUCTS!!!!  As discussed nicotine severely impairs your body's ability to heal surgical and traumatic wounds but also impairs bone healing.  Wounds and bone heal by forming microscopic blood vessels (angiogenesis) and nicotine is a vasoconstrictor (essentially, shrinks blood vessels).  Therefore, if vasoconstriction occurs to these microscopic blood vessels they essentially disappear and are unable to deliver necessary nutrients to the healing tissue.  This is one modifiable factor that you can do to dramatically increase your chances of healing your injury.    (This means no smoking, no nicotine gum, patches, etc)  DO NOT USE NONSTEROIDAL ANTI-INFLAMMATORY DRUGS (NSAID'S)  Using products such as Advil (ibuprofen), Aleve (naproxen), Motrin (ibuprofen) for additional pain control during fracture healing can delay and/or prevent the healing response.  If you would like to take over the counter (OTC) medication, Tylenol (acetaminophen) is ok.  However, some narcotic medications that are given for pain control contain acetaminophen as well. Therefore, you should not exceed more than 4000 mg of tylenol in a day if you do not have liver disease.  Also note that there are may OTC medicines, such as cold medicines and allergy medicines that my contain tylenol as well.  If you have any questions about medications and/or  interactions please ask your doctor/PA or your pharmacist.      ICE AND ELEVATE INJURED/OPERATIVE EXTREMITY  Using ice and elevating the injured extremity above your heart can help with swelling and pain control.  Icing in a pulsatile fashion, such as 20 minutes on and 20 minutes off, can be followed.    Do not place ice directly on skin. Make sure there is a barrier between to skin and the ice pack.    Using frozen items such as frozen peas works well as the conform nicely to the are that needs to be iced.  USE AN ACE WRAP OR TED HOSE FOR SWELLING CONTROL  In addition to icing and elevation, Ace wraps or TED hose are used to help limit and resolve swelling.  It is recommended to use Ace wraps or TED hose until you are informed to stop.    When using Ace Wraps start the wrapping distally (farthest away from the body) and wrap proximally (closer to the body)   Example: If you had surgery on your leg or thing and you do not have a splint on, start the ace wrap at the toes and work your way up to the thigh        If you had surgery on your upper extremity and do not have a splint on, start the ace wrap at your fingers and work your way up to the upper arm  IF YOU ARE IN A SPLINT OR  CAST DO NOT REMOVE IT FOR ANY REASON   If your splint gets wet for any reason please contact the office immediately. You may shower in your splint or cast as long as you keep it dry.  This can be done by wrapping in a cast cover or garbage back (or similar)  Do Not stick any thing down your splint or cast such as pencils, money, or hangers to try and scratch yourself with.  If you feel itchy take benadryl as prescribed on the bottle for itching  IF YOU ARE IN A CAM BOOT (BLACK BOOT)  You may remove boot periodically. Perform daily dressing changes as noted below.  Wash the liner of the boot regularly and wear a sock when wearing the boot. It is recommended that you sleep in the boot until told otherwise    Call office  for the following: Temperature greater than 101F Persistent nausea and vomiting Severe uncontrolled pain Redness, tenderness, or signs of infection (pain, swelling, redness, odor or green/yellow discharge around the site) Difficulty breathing, headache or visual disturbances Hives Persistent dizziness or light-headedness Extreme fatigue Any other questions or concerns you may have after discharge  In an emergency, call 911 or go to an Emergency Department at a nearby hospital  HELPFUL INFORMATION  If you had a block, it will wear off between 8-24 hrs postop typically.  This is period when your pain may go from nearly zero to the pain you would have had postop without the block.  This is an abrupt transition but nothing dangerous is happening.  You may take an extra dose of narcotic when this happens.  You should wean off your narcotic medicines as soon as you are able.  Most patients will be off or using minimal narcotics before their first postop appointment.   We suggest you use the pain medication the first night prior to going to bed, in order to ease any pain when the anesthesia wears off. You should avoid taking pain medications on an empty stomach as it will make you nauseous.  Do not drink alcoholic beverages or take illicit drugs when taking pain medications.  In most states it is against the law to drive while you are in a splint or sling.  And certainly against the law to drive while taking narcotics.  You may return to work/school in the next couple of days when you feel up to it.   Pain medication may make you constipated.  Below are a few solutions to try in this order: Decrease the amount of pain medication if you arent having pain. Drink lots of decaffeinated fluids. Drink prune juice and/or each dried prunes  If the first 3 dont work start with additional solutions Take Colace - an over-the-counter stool softener Take Senokot - an over-the-counter laxative Take  Miralax - a stronger over-the-counter laxative     CALL THE OFFICE WITH ANY QUESTIONS OR CONCERNS: [phone]   VISIT OUR WEBSITE FOR ADDITIONAL INFORMATION: [email or web address]

## 2021-01-22 NOTE — Transfer of Care (Signed)
Immediate Anesthesia Transfer of Care Note  Patient: Jocelyn Bauer  Procedure(s) Performed: HARDWARE REMOVAL (Right: Ankle)  Patient Location: PACU  Anesthesia Type:MAC combined with regional for post-op pain  Level of Consciousness: awake, alert  and oriented  Airway & Oxygen Therapy: Patient Spontanous Breathing and Patient connected to face mask oxygen  Post-op Assessment: Report given to RN and Post -op Vital signs reviewed and stable  Post vital signs: Reviewed and stable  Last Vitals:  Vitals Value Taken Time  BP 155/71 01/22/21 1037  Temp    Pulse 70 01/22/21 1045  Resp 9 01/22/21 1045  SpO2 98 % 01/22/21 1045  Vitals shown include unvalidated device data.  Last Pain:  Vitals:   01/22/21 0657  TempSrc:   PainSc: 0-No pain         Complications: No notable events documented.

## 2021-01-22 NOTE — Consult Note (Signed)
[facility] for Infectious Disease    Date of Admission:  01/22/2021     Total days of antibiotics 0               Reason for Consult: Wound infection s/p hardware removal  Referring Provider: Dr. Marcelino Scot  Primary Care Provider: [name], MD   ASSESSMENT:  Jocelyn Bauer is a 88 y/o female history of bilateral trimalleolar ankle fractures in June 7412 s/p ORIF complicated by development of a punctuate wound over the last 2 months refractory to outpatient treatment with tracking down to the hardware and now s/p removal of fibular hardware. Previously on Augmentin and doxycycline. Surgical specimens obtained and are pending. She is on methotrexate for RA.  Will plan for linezolid and renally dosed ciprofloxacin with narrowing antibiotics pending culture results. Will closely follow in the ID clinic. Discussed with primary team and okay for discharge from ID standpoint with medications being filled by [facility]. Continue wound care per Orthopedics. Follow up in the ID office in 1 week.   PLAN:  Start renally dosed ciprofloxacin and linezolid Monitor cultures with narrowing of antibiotics as able.  Post-operative wound care per Orthopedics.  Close follow up in ID office.  [facility] for discharge from ID standpoint.    Active Problems:   * No active hospital problems. *    chlorhexidine  60 mL Topical Once     HPI: Jocelyn Bauer is a 88 y.o. female with previous medical history as detailed below and significant for bilateral total elbow and total hip arthroplasties and bilateral ORIF of left and right ankles in [address]201 admitted with wound on her left fibula.   Jocelyn Bauer initially sustained fall in July 2021 resulting in bilateral trimalleolar ankle fractures requiring ORIF. No complications until about 2 months ago when she developed a punctuate wound over her right fibular incision. Refractory to basic wound care and seen by [facility] and noted to have wound communicating  with hardware. Initially placed on Augmentin and doxycycline with concern for her multiple joint replacements.   Jocelyn Bauer was brought to the OR for fibular hardware removal and partial excision of the right fibula. Fibular hardware was removed and one of the screw holes had necrotic bone which was debrided and sent for cultures. Was given a dose of Cefazolin in the OR. Of note she is on immunesuppression with Methotrexate for RA.    Review of Systems: Review of Systems  Constitutional:  Negative for chills, fever and weight loss.  Respiratory:  Negative for cough, shortness of breath and wheezing.   Cardiovascular:  Negative for chest pain and leg swelling.  Gastrointestinal:  Negative for abdominal pain, constipation, diarrhea, nausea and vomiting.  Skin:  Negative for rash.    Past Medical History:  Diagnosis Date   Anxiety    HX OF PANIC ATTACKS   Arthritis    RA   Chronic neck and back pain    Chronic pain    Chronic, continuous use of opioids 07/28/2019   Elbow locking    due to RA- elbow with limited extension   GERD (gastroesophageal reflux disease)    Headache    Hyperlipidemia    Hypertension    Hypothyroidism    Osteoarthritis    Osteoporosis    Pneumonia    Shortness of breath    occas   UTI (urinary tract infection)     Social History   Tobacco Use   Smoking status:  Former   Smokeless tobacco: Never   Tobacco comments:    QUIT SMOKING IN THE 70"S  Substance Use Topics   Alcohol use: No   Drug use: No    Family History  Problem Relation Age of Onset   Heart attack Mother    Prostate cancer Father    Lung disease Brother    Heart attack Sister    COPD Sister    Cancer Sister        She does not know which type of cancer    Allergies  Allergen Reactions   Contrast Media [Iodinated Contrast Media] Other (See Comments)    Itching.( 13 hr pre medication regimen worked fine. )   Tdap [Tetanus-Diphth-Acell Pertussis] Hives   Streptococcus  (Diplococcus) Pneumoniae [Streptococci] Other (See Comments)    Shortness of breath    OBJECTIVE: Blood pressure (!) 157/68, pulse 70, temperature (!) 97.5 F (36.4 C), resp. rate 11, height 5\' 2"  (1.575 m), weight 62.1 kg, SpO2 96 %.  Physical Exam Constitutional:      General: She is not in acute distress.    Appearance: She is well-developed.     Comments: Lying in bed with head of bed elevated; pleasant.   Cardiovascular:     Rate and Rhythm: Normal rate and regular rhythm.     Heart sounds: Normal heart sounds.  Pulmonary:     Effort: Pulmonary effort is normal.     Breath sounds: Normal breath sounds.  Skin:    General: Skin is warm and dry.  Neurological:     Mental Status: She is alert and oriented to person, place, and time.  Psychiatric:        Mood and Affect: Mood normal.    Lab Results Lab Results  Component Value Date   WBC 6.9 01/22/2021   HGB 10.6 (L) 01/22/2021   HCT 32.7 (L) 01/22/2021   MCV 89.6 01/22/2021   PLT 450 (H) 01/22/2021    Lab Results  Component Value Date   CREATININE 0.74 01/22/2021   BUN 12 01/22/2021   NA 138 01/22/2021   K 2.4 (LL) 01/22/2021   CL 98 01/22/2021   CO2 28 01/22/2021    Lab Results  Component Value Date   ALT 13 01/22/2021   AST 16 01/22/2021   ALKPHOS 63 01/22/2021   BILITOT 0.3 01/22/2021     Microbiology: Recent Results (from the past 240 hour(s))  Surgical pcr screen     Status: None   Collection Time: 01/22/21  7:13 AM   Specimen: Nasal Mucosa; Nasal Swab  Result Value Ref Range Status   MRSA, PCR NEGATIVE NEGATIVE Final   Staphylococcus aureus NEGATIVE NEGATIVE Final    Comment: (NOTE) The Xpert SA Assay (FDA approved for NASAL specimens in patients 52 years of age and older), is one component of a comprehensive surveillance program. It is not intended to diagnose infection nor to guide or monitor treatment. Performed at [facility], [place] [address] [place], [place] [address]       Terri Piedra, [facility]  01/22/2021  11:32 AM

## 2021-01-22 NOTE — Anesthesia Procedure Notes (Signed)
Anesthesia Regional Block: Popliteal block   Pre-Anesthetic Checklist: , timeout performed,  Correct Patient, Correct Site, Correct Laterality,  Correct Procedure, Correct Position, site marked,  Risks and benefits discussed,  Surgical consent,  Pre-op evaluation,  At surgeon's request and post-op pain management  Laterality: Right  Prep: chloraprep       Needles:  Injection technique: Single-shot  Needle Type: Echogenic Needle     Needle Length: 9cm  Needle Gauge: 21     Additional Needles:   Procedures:,,,, ultrasound used (permanent image in chart),,    Narrative:  Start time: 01/22/2021 8:12 AM End time: 01/22/2021 8:18 AM Injection made incrementally with aspirations every 5 mL.  Performed by: Personally  Anesthesiologist: Suzette Battiest, MD

## 2021-01-22 NOTE — Op Note (Addendum)
01/22/2021  9:11 AM  PATIENT:  Jocelyn Bauer  88 y.o. female  PRE-OPERATIVE DIAGNOSIS:  EXPOSED HARDWARE RIGHT ANKLE, OSTEOMYELITIS WITH DRAINING SINUS  POST-OPERATIVE DIAGNOSIS: EXPOSED HARDWARE RIGHT ANKLE, OSTEOMYELITIS WITH DRAINING SINUS  PROCEDURE:  Procedure(s): HARDWARE REMOVAL (Right) FIBULA, DEEP PARTIAL EXCISION RIGHT FIBULA RETENTION SUTURE CLOSURE 11 CM  SURGEON:  Surgeon(s) and Role:    Altamese [place], MD - Primary  PHYSICIAN ASSISTANT: NONE  ANESTHESIA:   local and MAC  I/O:  No intake/output data recorded.  SPECIMEN:  RIGHT FIBULA MULTIPLE; SINUS TRACT  TOURNIQUET:  NONE  COMPLICATIONS: NONE  DICTATION: .Note written in EPIC  DISPOSITION: TO PACU  CONDITION: STABLE  DELAY START OF DVT PROPHYLAXIS BECAUSE OF BLEEDING RISK: NO  BRIEF SUMMARY OF INDICATION FOR PROCEDURE:  Patient is a pleasant 88 y.o. who underwent plate fixation of a fracture with subsequent healing. Over the last two months wound was noted and then visible hardware. Therefore, I discussed with the patient and her son the risks and benefits of surgical removal including infection, nerve or vessel injury, failure to alleviate symptoms, occult nonunion, re-fracture, DVT, PE, and multiple others. They did wish to proceed.   BRIEF SUMMARY OF PROCEDURE:  The patient was taken to the operating room after local block and administration of IV potassium for hypokalemia. Because of exposed bone, we did believe proceeding with removal was in the best interest of the patient.  MAC anesthesia was also given. The right lower extremity was prepped and draped in usual sterile fashion.  No tourniquet was used during the procedure.  I remade the old distal incision and dissected sharply down to the plate, elevating the soft tissues. I identified and removed all screws. Then a curette was used to partially excise the periosteum and bone under the plate and down through the bone in all of the screw holes in  the area of exposure. One of the screw holes had necrotic bone debridedement of which produced a 5 mm x 5 mm cavity. Some of this material was sent for culture and then ancef 2 gm administered. Aggressive lavage followed. Final x-rays confirmed removal of all hardware and a healed fracture. The wounds were irrigated thoroughly once more and closed with retention sutures and vertical mattress over 11 cm with PDS and nylon because a standard closure could not be performed. A sterile gently compressive dressing was applied.  The patient was taken to the PACU in stable condition.    PROGNOSIS: Patient will be weightbearing as tolerated with aggressive active and passive motion of the knee and ankle. Bleeding would be anticipated. She may change or remove his dressing in 48 hours and shower. Patient will follow up in 10 days for removal of sutures. Antibiotics are anticipated to continue for two to four weeks with Cipro 750 mg. Because immunosuppression for RA with Methotrexate and more extensive involvement than anticipated we will consult the Infectious Disease Service, as well.      Jocelyn Bauer. Jocelyn Bauer, M.D.

## 2021-01-22 NOTE — Anesthesia Preprocedure Evaluation (Signed)
Anesthesia Evaluation  Patient identified by MRN, date of birth, ID band Patient awake    Reviewed: Allergy & Precautions, NPO status , Patient's Chart, lab work & pertinent test results  Airway Mallampati: II  TM Distance: >3 FB Neck ROM: Full    Dental  (+) Dental Advisory Given   Pulmonary former smoker,    breath sounds clear to auscultation       Cardiovascular hypertension, Pt. on medications  Rhythm:Regular Rate:Normal     Neuro/Psych negative neurological ROS     GI/Hepatic Neg liver ROS, GERD  ,  Endo/Other  Hypothyroidism   Renal/GU negative Renal ROS     Musculoskeletal  (+) Arthritis ,   Abdominal   Peds  Hematology negative hematology ROS (+)   Anesthesia Other Findings   Reproductive/Obstetrics                             Anesthesia Physical Anesthesia Plan  ASA: 3  Anesthesia Plan: MAC and Regional   Post-op Pain Management: Regional block and Tylenol PO (pre-op)   Induction:   PONV Risk Score and Plan: 2 and Propofol infusion, Ondansetron and Treatment may vary due to age or medical condition  Airway Management Planned: Natural Airway and Simple Face Mask  Additional Equipment:   Intra-op Plan:   Post-operative Plan:   Informed Consent: I have reviewed the patients History and Physical, chart, labs and discussed the procedure including the risks, benefits and alternatives for the proposed anesthesia with the patient or authorized representative who has indicated his/her understanding and acceptance.       Plan Discussed with: CRNA  Anesthesia Plan Comments:         Anesthesia Quick Evaluation

## 2021-01-23 ENCOUNTER — Encounter (HOSPITAL_COMMUNITY): Payer: Self-pay | Admitting: Orthopedic Surgery

## 2021-01-23 NOTE — Anesthesia Postprocedure Evaluation (Signed)
Anesthesia Post Note  Patient: Jocelyn Bauer  Procedure(s) Performed: HARDWARE REMOVAL (Right: Ankle)     Patient location during evaluation: PACU Anesthesia Type: Regional Level of consciousness: awake and alert Pain management: pain level controlled Vital Signs Assessment: post-procedure vital signs reviewed and stable Respiratory status: spontaneous breathing, nonlabored ventilation, respiratory function stable and patient connected to nasal cannula oxygen Cardiovascular status: stable and blood pressure returned to baseline Postop Assessment: no apparent nausea or vomiting Anesthetic complications: no   No notable events documented.  Last Vitals:  Vitals:   01/22/21 1430 01/22/21 1453  BP:  138/69  Pulse: 76   Resp: 11 14  Temp:    SpO2: 97%     Last Pain:  Vitals:   01/22/21 1408  TempSrc:   PainSc: 0-No pain                 Tiajuana Amass

## 2021-01-24 ENCOUNTER — Encounter (HOSPITAL_COMMUNITY): Payer: Self-pay | Admitting: Orthopedic Surgery

## 2021-01-27 LAB — AEROBIC/ANAEROBIC CULTURE W GRAM STAIN (SURGICAL/DEEP WOUND)
Culture: NO GROWTH
Gram Stain: NONE SEEN

## 2021-01-31 ENCOUNTER — Inpatient Hospital Stay: Payer: BLUE CROSS/BLUE SHIELD | Admitting: Family

## 2021-02-04 DIAGNOSIS — E785 Hyperlipidemia, unspecified: Secondary | ICD-10-CM | POA: Diagnosis not present

## 2021-02-04 DIAGNOSIS — M810 Age-related osteoporosis without current pathological fracture: Secondary | ICD-10-CM | POA: Diagnosis not present

## 2021-02-04 DIAGNOSIS — I119 Hypertensive heart disease without heart failure: Secondary | ICD-10-CM | POA: Diagnosis not present

## 2021-02-04 DIAGNOSIS — E039 Hypothyroidism, unspecified: Secondary | ICD-10-CM | POA: Diagnosis not present

## 2021-02-05 ENCOUNTER — Other Ambulatory Visit: Payer: Self-pay

## 2021-02-05 ENCOUNTER — Encounter: Payer: Self-pay | Admitting: Family

## 2021-02-05 ENCOUNTER — Ambulatory Visit (INDEPENDENT_AMBULATORY_CARE_PROVIDER_SITE_OTHER): Payer: Medicare Other | Admitting: Family

## 2021-02-05 VITALS — BP 125/65 | HR 69 | Temp 98.2°F

## 2021-02-05 DIAGNOSIS — T847XXD Infection and inflammatory reaction due to other internal orthopedic prosthetic devices, implants and grafts, subsequent encounter: Secondary | ICD-10-CM | POA: Diagnosis not present

## 2021-02-05 MED ORDER — CIPROFLOXACIN HCL 500 MG PO TABS: 500.0000 mg | ORAL_TABLET | Freq: Two times a day (BID) | ORAL | 0 refills | Status: AC

## 2021-02-05 MED ORDER — LINEZOLID 600 MG PO TABS: 600.0000 mg | ORAL_TABLET | Freq: Two times a day (BID) | ORAL | 0 refills | Status: AC

## 2021-02-05 NOTE — Patient Instructions (Signed)
Nice to see you.  Continue wound care per Dr. Marcelino Scot.  We will check your lab work today.  Refills of your antibiotics have been sent to the pharmacy.  Plan for follow up 3 weeks in person or virtual.  Have a great day and stay safe!

## 2021-02-05 NOTE — Assessment & Plan Note (Signed)
Ms. Korf appears to be hearing well following fibular hardware removal secondary to infection. Cultures growing Pseudomonas. Since she was on broad spectrum antibiotics prior to surgery will continue current dose of linezolid and ciprofloxacin for additional 2 weeks. Check CRP, Sed Rate, and CBC for therapeutic drug monitoring. Continue wound care per Dr. Marcelino Scot. Plan for follow up in 3 weeks or sooner if needed.

## 2021-02-05 NOTE — Progress Notes (Signed)
Subjective:    Patient ID: Jocelyn Bauer, female    DOB: 1928/05/21, 88 y.o.   MRN: [id number]  Chief Complaint  Patient presents with   Follow-up    HPI:  Jocelyn Bauer is a 88 y.o. female with history of bilateral trimalleolar ankle fractures in June 0962 s/p ORIF complicated by punctuate wound refractory to outpatient treatment and recently admitted to [facility] for fibular hardware removal with no apparent involvement of the tibial hardware. She was on Augmentin and doxycyline prior to surgery. Surgical specimens obtained were without growth and pan-sensitive Pseudomonas. Discharged on linezolid and ciprofloxacin with close ID follow-up. Here today for hospitalization follow up.  Jocelyn Bauer is accompanied by her daughter today. Has been doing well since discharge and has been taking care of the wound with occasional pain. Taking antibiotics daily as prescribed with no adverse side effects or missed doses. Has appointment with Dr. Marcelino Scot on Wednesday for follow up. No fevers, chills or drainage from the site.      Allergies  Allergen Reactions   Contrast Media [Iodinated Contrast Media] Other (See Comments)    Itching.( 13 hr pre medication regimen worked fine. )   Tdap [Tetanus-Diphth-Acell Pertussis] Hives   Streptococcus (Diplococcus) Pneumoniae [Streptococci] Other (See Comments)    Shortness of breath      Outpatient Medications Prior to Visit  Medication Sig Dispense Refill   acetaminophen (TYLENOL) 325 MG tablet Take 2 tablets (650 mg total) by mouth 3 (three) times daily. (Patient taking differently: Take 650 mg by mouth every 6 (six) hours as needed for moderate pain.) 10 tablet 0   amLODipine (NORVASC) 5 MG tablet Take 5 mg by mouth every morning.     ascorbic acid (VITAMIN C) 500 MG tablet Take 1 tablet (500 mg total) by mouth daily. 10 tablet 0   aspirin EC 81 MG tablet Take 81 mg by mouth daily. Swallow whole.     benazepril (LOTENSIN) 20 MG tablet Take 20 mg  by mouth every morning.     bisacodyl (DULCOLAX) 5 MG EC tablet Take 1 tablet (5 mg total) by mouth daily as needed for moderate constipation. 30 tablet 0   cholecalciferol (VITAMIN D3) 25 MCG (1000 UNIT) tablet Take 1,000 Units by mouth daily.     denosumab (PROLIA) 60 MG/ML SOSY injection Inject 60 mg into the skin every 6 (six) months.     docusate sodium (COLACE) 100 MG capsule Take 2 capsules (200 mg total) by mouth 2 (two) times daily. (Patient taking differently: Take 200 mg by mouth daily as needed for moderate constipation.) 10 capsule 0   fentaNYL (DURAGESIC) 50 MCG/HR Place 1 patch onto the skin every 3 (three) days. 2 patch 0   folic acid (FOLVITE) 1 MG tablet Take 1 mg by mouth daily.     furosemide (LASIX) 20 MG tablet Take 20 mg by mouth every other day.     levothyroxine (SYNTHROID) 125 MCG tablet Take 125 mcg by mouth daily before breakfast.     LORazepam (ATIVAN) 0.5 MG tablet Take 0.5 mg by mouth 2 (two) times daily as needed for anxiety.     methotrexate 50 MG/2ML injection every Wednesday. 0.9 mL - takes orally in juice     omeprazole (PRILOSEC OTC) 20 MG tablet Take 20 mg by mouth every other day.     oxyCODONE-acetaminophen (PERCOCET) 10-325 MG tablet Take 1 tablet by mouth every 6 (six) hours as needed for pain.  predniSONE (DELTASONE) 5 MG tablet Take 1 tablet (5 mg total) by mouth daily. 15 tablet 0   vitamin B-12 (CYANOCOBALAMIN) 1000 MCG tablet Take 1,000 mcg by mouth daily.     ciprofloxacin (CIPRO) 500 MG tablet Take 1 tablet (500 mg total) by mouth 2 (two) times daily for 14 days. 28 tablet 0   linezolid (ZYVOX) 600 MG tablet Take 1 tablet (600 mg total) by mouth 2 (two) times daily for 14 days. 28 tablet 0   No facility-administered medications prior to visit.     Past Medical History:  Diagnosis Date   Anxiety    HX OF PANIC ATTACKS   Arthritis    RA   Chronic neck and back pain    Chronic pain    Chronic, continuous use of opioids 07/28/2019   Elbow  locking    due to RA- elbow with limited extension   GERD (gastroesophageal reflux disease)    Headache    Hyperlipidemia    Hypertension    Hypothyroidism    Osteoarthritis    Osteoporosis    Pneumonia    Shortness of breath    occas   UTI (urinary tract infection)      Past Surgical History:  Procedure Laterality Date   CATARACT EXTRACTION Bilateral    COLONOSCOPY     ELBOW SURGERY Right    ELBOW SURGERY Left    ESOPHAGOGASTRODUODENOSCOPY     HARDWARE REMOVAL Right 07/13/2015   Procedure:  WITH HARDWARE REMOVAL REPAIR AS NECESSARY;  Surgeon: Roseanne Kaufman, MD;  Location: [place];  Service: Orthopedics;  Laterality: Right;   HARDWARE REMOVAL Right 01/22/2021   Procedure: HARDWARE REMOVAL;  Surgeon: Altamese Arnett, MD;  Location: [place];  Service: Orthopedics;  Laterality: Right;   JOINT REPLACEMENT  1998   left hip replacement and bilateral elbow replacements  about 15 yrs ago   ORIF ANKLE FRACTURE Bilateral 07/27/2019   Procedure: OPEN REDUCTION INTERNAL FIXATION (ORIF) ANKLE FRACTURE;  Surgeon: Altamese [place], MD;  Location: [place];  Service: Orthopedics;  Laterality: Bilateral;   thyroidectomy     TOTAL ELBOW ARTHROPLASTY Right 07/13/2015   Procedure: RIGHT ELBOW REVISION TOTAL ELBOW ARTHROPLASTY ;  Surgeon: Roseanne Kaufman, MD;  Location: [place];  Service: Orthopedics;  Laterality: Right;   TOTAL HIP ARTHROPLASTY     TOTAL HIP REVISION  03/11/2011   Procedure: TOTAL HIP REVISION;  Surgeon: [name], MD;  Location: [facility];  Service: Orthopedics;  Laterality: Left;   TRICEPS TENDON REPAIR Right 07/13/2015   Procedure:  TRICEP RECONSTRUCTION REPAIR WITH ALLOGRAFT, ULNAR NERVE NEUROLYSIS;  Surgeon: Roseanne Kaufman, MD;  Location: [place];  Service: Orthopedics;  Laterality: Right;       Review of Systems  Constitutional:  Negative for chills, diaphoresis, fatigue and fever.  Respiratory:  Negative for cough, chest tightness, shortness of breath and wheezing.   Cardiovascular:   Negative for chest pain.  Gastrointestinal:  Negative for abdominal pain, diarrhea, nausea and vomiting.     Objective:    BP 125/65    Pulse 69    Temp 98.2 F (36.8 C) (Oral)    SpO2 97%  Nursing note and vital signs reviewed.  Physical Exam Constitutional:      General: She is not in acute distress.    Appearance: She is well-developed.  Cardiovascular:     Rate and Rhythm: Normal rate and regular rhythm.     Heart sounds: Normal heart sounds.  Pulmonary:  Effort: Pulmonary effort is normal.     Breath sounds: Normal breath sounds.  Skin:    General: Skin is warm and dry.  Neurological:     Mental Status: She is alert and oriented to person, place, and time.  Psychiatric:        Mood and Affect: Mood normal.    Surgical incision is well approximated with sutures in place. No drainage, areas of induration, or obvious signs of infection.   Depression screen PHQ 2/9 02/05/2021  Decreased Interest 0  Down, Depressed, Hopeless 0  PHQ - 2 Score 0       Assessment & Plan:    Patient Active Problem List   Diagnosis Date Noted   Infected hardware in right leg (HCC)    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)    Contusion of left elbow 06/14/2020   Closed trimalleolar fracture of right ankle    Chronic, continuous use of opioids 07/28/2019   Bilateral ankle fractures 07/26/2019   Right elbow pain 07/14/2015   Presence of right artificial elbow joint 07/13/2015   Hypertension    Hypothyroidism    Hyperlipidemia    GERD (gastroesophageal reflux disease)    Anxiety    Osteoporosis    Chronic neck and back pain    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     Problem List Items Addressed This Visit       Musculoskeletal and Integument   Infected hardware in right leg  [facility]) - Primary    Ms. Nugent appears to be hearing well following fibular hardware removal secondary to infection. Cultures growing Pseudomonas. Since she was on broad spectrum  antibiotics prior to surgery will continue current dose of linezolid and ciprofloxacin for additional 2 weeks. Check CRP, Sed Rate, and CBC for therapeutic drug monitoring. Continue wound care per Dr. Marcelino Scot. Plan for follow up in 3 weeks or sooner if needed.      Relevant Medications   ciprofloxacin (CIPRO) 500 MG tablet   linezolid (ZYVOX) 600 MG tablet   Other Relevant Orders   C-reactive protein   Sedimentation rate   CBC     I am having Jocelyn Bauer maintain her amLODipine, benazepril, folic acid, predniSONE, LORazepam, acetaminophen, fentaNYL, bisacodyl, docusate sodium, ascorbic acid, levothyroxine, oxyCODONE-acetaminophen, furosemide, cholecalciferol, methotrexate, vitamin B-12, omeprazole, aspirin EC, denosumab, ciprofloxacin, and linezolid.   Meds ordered this encounter  Medications   ciprofloxacin (CIPRO) 500 MG tablet    Sig: Take 1 tablet (500 mg total) by mouth 2 (two) times daily for 14 days.    Dispense:  28 tablet    Refill:  0    Order Specific Question:   Supervising Provider    Answer:   Baxter Flattery, CYNTHIA [[id number]]   linezolid (ZYVOX) 600 MG tablet    Sig: Take 1 tablet (600 mg total) by mouth 2 (two) times daily for 14 days.    Dispense:  28 tablet    Refill:  0    Order Specific Question:   Supervising Provider    Answer:   Carlyle Basques [[id number]]     Follow-up: Return in about 3 weeks (around 02/26/2021), or if symptoms worsen or fail to improve.   Terri Piedra, MSN, FNP-C Nurse Practitioner [facility] [place] number: [phone]

## 2021-02-06 LAB — CBC
HCT: 34 % — ABNORMAL LOW (ref 35.0–45.0)
Hemoglobin: 10.6 g/dL — ABNORMAL LOW (ref 11.7–15.5)
MCH: 28 pg (ref 27.0–33.0)
MCHC: 31.2 g/dL — ABNORMAL LOW (ref 32.0–36.0)
MCV: 89.7 fL (ref 80.0–100.0)
MPV: 8.4 fL (ref 7.5–12.5)
Platelets: 456 10*3/uL — ABNORMAL HIGH (ref 140–400)
RBC: 3.79 10*6/uL — ABNORMAL LOW (ref 3.80–5.10)
RDW: 15.9 % — ABNORMAL HIGH (ref 11.0–15.0)
WBC: 7.7 10*3/uL (ref 3.8–10.8)

## 2021-02-06 LAB — C-REACTIVE PROTEIN: CRP: 5.1 mg/L (ref <8.0)

## 2021-02-06 LAB — SEDIMENTATION RATE: Sed Rate: 28 mm/h (ref 0–30)

## 2021-02-07 DIAGNOSIS — S82851D Displaced trimalleolar fracture of right lower leg, subsequent encounter for closed fracture with routine healing: Secondary | ICD-10-CM | POA: Diagnosis not present

## 2021-02-07 DIAGNOSIS — M86461 Chronic osteomyelitis with draining sinus, right tibia and fibula: Secondary | ICD-10-CM | POA: Diagnosis not present

## 2021-02-08 ENCOUNTER — Inpatient Hospital Stay: Payer: BLUE CROSS/BLUE SHIELD | Admitting: Family

## 2021-02-26 ENCOUNTER — Telehealth: Payer: BLUE CROSS/BLUE SHIELD | Admitting: Family

## 2021-03-01 ENCOUNTER — Encounter: Payer: Self-pay | Admitting: Family

## 2021-03-01 ENCOUNTER — Other Ambulatory Visit: Payer: Self-pay

## 2021-03-01 ENCOUNTER — Ambulatory Visit (INDEPENDENT_AMBULATORY_CARE_PROVIDER_SITE_OTHER): Payer: Medicare Other | Admitting: Family

## 2021-03-01 DIAGNOSIS — T847XXD Infection and inflammatory reaction due to other internal orthopedic prosthetic devices, implants and grafts, subsequent encounter: Secondary | ICD-10-CM

## 2021-03-01 NOTE — Assessment & Plan Note (Signed)
Jocelyn Bauer has completed antimicrobial therapy for suspected hardware infection status post removal with linezolid and ciprofloxacin.  Wound continues to heal well without complications.  Inflammatory markers normalized at most recent office visit.  Continue wound care per orthopedics.  No additional antibiotics necessary at this time.  Discussed to monitor for signs of infection and to seek additional care if they develop.  Follow-up with ID as needed.

## 2021-03-01 NOTE — Progress Notes (Signed)
Subjective:    Patient ID: Jocelyn Bauer, female    DOB: 1928/11/06, 88 y.o.   MRN: [id number]  Chief Complaint  Patient presents with   Follow-up     Virtual Visit via Telephone/Video Note   I connected with Jocelyn Bauer on 03/01/2021 at 1:45 PM by telephone and verified that I am speaking with the correct person using two identifiers.   I discussed the limitations, risks, security and privacy concerns of performing an evaluation and management service by telephone and the availability of in person appointments. I also discussed with the patient that there may be a patient responsible charge related to this service. The patient expressed understanding and agreed to proceed.  Location:  Patient: Home  in [place], [place] Provider: [facility] in [place], [place]   HPI:  Jocelyn Bauer is a 88 y.o. female pseudomonal infected hardware of the right leg status post removal last seen on 02/05/2021 with good adherence and tolerance to her antimicrobial regimen of linezolid and ciprofloxacin.  Lab work showed a sed rate of 28 down from 63 and CRP level 5.1 down from 11.  Platelet level was 456 checked as part of therapeutic drug monitoring while on linezolid.  She was continued on her ciprofloxacin and linezolid for an additional 2 weeks.  Telehealth visit today for follow-up.  [name] completed her ciprofloxacin and linezolid about 1 week ago for which she tolerated well although had some nausea at times.  Wound is healing without complications.  Increased arthralgia today secondary to osteoarthritis.  Family report wound is healing well and has followed up with Dr. Marcelino Scot.    Allergies  Allergen Reactions   Contrast Media [Iodinated Contrast Media] Other (See Comments)    Itching.( 13 hr pre medication regimen worked fine. )   Tdap [Tetanus-Diphth-Acell Pertussis] Hives   Streptococcus (Diplococcus) Pneumoniae [Streptococci] Other (See Comments)    Shortness of breath       Outpatient Medications Prior to Visit  Medication Sig Dispense Refill   acetaminophen (TYLENOL) 325 MG tablet Take 2 tablets (650 mg total) by mouth 3 (three) times daily. (Patient taking differently: Take 650 mg by mouth every 6 (six) hours as needed for moderate pain.) 10 tablet 0   amLODipine (NORVASC) 5 MG tablet Take 5 mg by mouth every morning.     ascorbic acid (VITAMIN C) 500 MG tablet Take 1 tablet (500 mg total) by mouth daily. 10 tablet 0   aspirin EC 81 MG tablet Take 81 mg by mouth daily. Swallow whole.     benazepril (LOTENSIN) 20 MG tablet Take 20 mg by mouth every morning.     bisacodyl (DULCOLAX) 5 MG EC tablet Take 1 tablet (5 mg total) by mouth daily as needed for moderate constipation. 30 tablet 0   cholecalciferol (VITAMIN D3) 25 MCG (1000 UNIT) tablet Take 1,000 Units by mouth daily.     denosumab (PROLIA) 60 MG/ML SOSY injection Inject 60 mg into the skin every 6 (six) months.     docusate sodium (COLACE) 100 MG capsule Take 2 capsules (200 mg total) by mouth 2 (two) times daily. (Patient taking differently: Take 200 mg by mouth daily as needed for moderate constipation.) 10 capsule 0   fentaNYL (DURAGESIC) 50 MCG/HR Place 1 patch onto the skin every 3 (three) days. 2 patch 0   folic acid (FOLVITE) 1 MG tablet Take 1 mg by mouth daily.     furosemide (LASIX) 20 MG tablet Take 20 mg  by mouth every other day.     levothyroxine (SYNTHROID) 125 MCG tablet Take 125 mcg by mouth daily before breakfast.     LORazepam (ATIVAN) 0.5 MG tablet Take 0.5 mg by mouth 2 (two) times daily as needed for anxiety.     methotrexate 50 MG/2ML injection every Wednesday. 0.9 mL - takes orally in juice     omeprazole (PRILOSEC OTC) 20 MG tablet Take 20 mg by mouth every other day.     oxyCODONE-acetaminophen (PERCOCET) 10-325 MG tablet Take 1 tablet by mouth every 6 (six) hours as needed for pain.     predniSONE (DELTASONE) 5 MG tablet Take 1 tablet (5 mg total) by mouth daily. 15 tablet  0   vitamin B-12 (CYANOCOBALAMIN) 1000 MCG tablet Take 1,000 mcg by mouth daily.     No facility-administered medications prior to visit.     Past Medical History:  Diagnosis Date   Anxiety    HX OF PANIC ATTACKS   Arthritis    RA   Chronic neck and back pain    Chronic pain    Chronic, continuous use of opioids 07/28/2019   Elbow locking    due to RA- elbow with limited extension   GERD (gastroesophageal reflux disease)    Headache    Hyperlipidemia    Hypertension    Hypothyroidism    Osteoarthritis    Osteoporosis    Pneumonia    Shortness of breath    occas   UTI (urinary tract infection)      Past Surgical History:  Procedure Laterality Date   CATARACT EXTRACTION Bilateral    COLONOSCOPY     ELBOW SURGERY Right    ELBOW SURGERY Left    ESOPHAGOGASTRODUODENOSCOPY     HARDWARE REMOVAL Right 07/13/2015   Procedure:  WITH HARDWARE REMOVAL REPAIR AS NECESSARY;  Surgeon: Roseanne Kaufman, MD;  Location: [facility];  Service: Orthopedics;  Laterality: Right;   HARDWARE REMOVAL Right 01/22/2021   Procedure: HARDWARE REMOVAL;  Surgeon: Altamese Van Buren, MD;  Location: [facility];  Service: Orthopedics;  Laterality: Right;   JOINT REPLACEMENT  1998   left hip replacement and bilateral elbow replacements  about 15 yrs ago   ORIF ANKLE FRACTURE Bilateral 07/27/2019   Procedure: OPEN REDUCTION INTERNAL FIXATION (ORIF) ANKLE FRACTURE;  Surgeon: Altamese Conroe, MD;  Location: [facility];  Service: Orthopedics;  Laterality: Bilateral;   thyroidectomy     TOTAL ELBOW ARTHROPLASTY Right 07/13/2015   Procedure: RIGHT ELBOW REVISION TOTAL ELBOW ARTHROPLASTY ;  Surgeon: Roseanne Kaufman, MD;  Location: [facility];  Service: Orthopedics;  Laterality: Right;   TOTAL HIP ARTHROPLASTY     TOTAL HIP REVISION  03/11/2011   Procedure: TOTAL HIP REVISION;  Surgeon: [name], MD;  Location: [facility];  Service: Orthopedics;  Laterality: Left;   TRICEPS TENDON REPAIR Right 07/13/2015   Procedure:  TRICEP RECONSTRUCTION  REPAIR WITH ALLOGRAFT, ULNAR NERVE NEUROLYSIS;  Surgeon: Roseanne Kaufman, MD;  Location: [facility];  Service: Orthopedics;  Laterality: Right;       Review of Systems  Constitutional:  Negative for chills, diaphoresis, fatigue and fever.  Respiratory:  Negative for cough, chest tightness, shortness of breath and wheezing.   Cardiovascular:  Negative for chest pain.  Gastrointestinal:  Negative for abdominal pain, diarrhea, nausea and vomiting.  Musculoskeletal:  Positive for arthralgias.     Objective:    Nursing note and vital signs reviewed.  Ms. Lau is pleasant to speak with and sounds to be doing  well and in no apparent distress.  Physical exam limited secondary to telehealth visit.    Assessment & Plan:   Problem List Items Addressed This Visit       Musculoskeletal and Integument   Infected hardware in right leg [facility])    Ms. Warmack has completed antimicrobial therapy for suspected hardware infection status post removal with linezolid and ciprofloxacin.  Wound continues to heal well without complications.  Inflammatory markers normalized at most recent office visit.  Continue wound care per orthopedics.  No additional antibiotics necessary at this time.  Discussed to monitor for signs of infection and to seek additional care if they develop.  Follow-up with ID as needed.        I am having Jocelyn Bauer maintain her amLODipine, benazepril, folic acid, predniSONE, LORazepam, acetaminophen, fentaNYL, bisacodyl, docusate sodium, ascorbic acid, levothyroxine, oxyCODONE-acetaminophen, furosemide, cholecalciferol, methotrexate, vitamin B-12, omeprazole, aspirin EC, and denosumab.   I discussed the assessment and treatment plan with the patient. The patient was provided an opportunity to ask questions and all were answered. The patient agreed with the plan and demonstrated an understanding of the instructions.   The patient was advised to call back or seek an in-person evaluation  if the symptoms worsen or if the condition fails to improve as anticipated.   I provided   6 minutes of non-face-to-face time during this encounter.  Follow-up: Follow up as needed if symptoms worsen or do not improve.    Terri Piedra, MSN, FNP-C Nurse Practitioner [facility] for Infectious Disease [place] number: [phone]

## 2021-03-17 ENCOUNTER — Encounter (HOSPITAL_COMMUNITY): Payer: Self-pay | Admitting: Emergency Medicine

## 2021-03-17 ENCOUNTER — Other Ambulatory Visit: Payer: Self-pay

## 2021-03-17 ENCOUNTER — Emergency Department (HOSPITAL_COMMUNITY): Payer: Medicare Other

## 2021-03-17 ENCOUNTER — Inpatient Hospital Stay (HOSPITAL_COMMUNITY)
Admission: EM | Admit: 2021-03-17 | Discharge: 2021-03-21 | DRG: 193 | Disposition: A | Discharge status: Skilled Nursing Facility | Payer: Medicare Other | Attending: Internal Medicine | Admitting: Internal Medicine

## 2021-03-17 DIAGNOSIS — F410 Panic disorder [episodic paroxysmal anxiety] without agoraphobia: Secondary | ICD-10-CM | POA: Diagnosis present

## 2021-03-17 DIAGNOSIS — E876 Hypokalemia: Secondary | ICD-10-CM | POA: Diagnosis present

## 2021-03-17 DIAGNOSIS — Z825 Family history of asthma and other chronic lower respiratory diseases: Secondary | ICD-10-CM | POA: Diagnosis not present

## 2021-03-17 DIAGNOSIS — Z8249 Family history of ischemic heart disease and other diseases of the circulatory system: Secondary | ICD-10-CM | POA: Diagnosis not present

## 2021-03-17 DIAGNOSIS — M069 Rheumatoid arthritis, unspecified: Secondary | ICD-10-CM | POA: Diagnosis present

## 2021-03-17 DIAGNOSIS — Z887 Allergy status to serum and vaccine status: Secondary | ICD-10-CM

## 2021-03-17 DIAGNOSIS — E039 Hypothyroidism, unspecified: Secondary | ICD-10-CM | POA: Diagnosis present

## 2021-03-17 DIAGNOSIS — L98499 Non-pressure chronic ulcer of skin of other sites with unspecified severity: Secondary | ICD-10-CM | POA: Diagnosis present

## 2021-03-17 DIAGNOSIS — R1310 Dysphagia, unspecified: Secondary | ICD-10-CM | POA: Diagnosis not present

## 2021-03-17 DIAGNOSIS — M810 Age-related osteoporosis without current pathological fracture: Secondary | ICD-10-CM | POA: Diagnosis present

## 2021-03-17 DIAGNOSIS — M6281 Muscle weakness (generalized): Secondary | ICD-10-CM | POA: Diagnosis not present

## 2021-03-17 DIAGNOSIS — R2681 Unsteadiness on feet: Secondary | ICD-10-CM | POA: Diagnosis not present

## 2021-03-17 DIAGNOSIS — Z79899 Other long term (current) drug therapy: Secondary | ICD-10-CM | POA: Diagnosis not present

## 2021-03-17 DIAGNOSIS — I10 Essential (primary) hypertension: Secondary | ICD-10-CM | POA: Diagnosis present

## 2021-03-17 DIAGNOSIS — Z9181 History of falling: Secondary | ICD-10-CM | POA: Diagnosis not present

## 2021-03-17 DIAGNOSIS — Z20822 Contact with and (suspected) exposure to covid-19: Secondary | ICD-10-CM | POA: Diagnosis present

## 2021-03-17 DIAGNOSIS — Z8042 Family history of malignant neoplasm of prostate: Secondary | ICD-10-CM

## 2021-03-17 DIAGNOSIS — F1190 Opioid use, unspecified, uncomplicated: Secondary | ICD-10-CM | POA: Diagnosis present

## 2021-03-17 DIAGNOSIS — D849 Immunodeficiency, unspecified: Secondary | ICD-10-CM | POA: Diagnosis present

## 2021-03-17 DIAGNOSIS — T847XXA Infection and inflammatory reaction due to other internal orthopedic prosthetic devices, implants and grafts, initial encounter: Secondary | ICD-10-CM | POA: Diagnosis present

## 2021-03-17 DIAGNOSIS — Z96621 Presence of right artificial elbow joint: Secondary | ICD-10-CM | POA: Diagnosis not present

## 2021-03-17 DIAGNOSIS — M1990 Unspecified osteoarthritis, unspecified site: Secondary | ICD-10-CM | POA: Diagnosis not present

## 2021-03-17 DIAGNOSIS — K219 Gastro-esophageal reflux disease without esophagitis: Secondary | ICD-10-CM | POA: Diagnosis not present

## 2021-03-17 DIAGNOSIS — Z87891 Personal history of nicotine dependence: Secondary | ICD-10-CM

## 2021-03-17 DIAGNOSIS — I491 Atrial premature depolarization: Secondary | ICD-10-CM | POA: Diagnosis not present

## 2021-03-17 DIAGNOSIS — Z96642 Presence of left artificial hip joint: Secondary | ICD-10-CM | POA: Diagnosis not present

## 2021-03-17 DIAGNOSIS — E785 Hyperlipidemia, unspecified: Secondary | ICD-10-CM | POA: Diagnosis present

## 2021-03-17 DIAGNOSIS — R0689 Other abnormalities of breathing: Secondary | ICD-10-CM | POA: Diagnosis not present

## 2021-03-17 DIAGNOSIS — I248 Other forms of acute ischemic heart disease: Secondary | ICD-10-CM | POA: Diagnosis present

## 2021-03-17 DIAGNOSIS — R0902 Hypoxemia: Secondary | ICD-10-CM | POA: Diagnosis not present

## 2021-03-17 DIAGNOSIS — J9601 Acute respiratory failure with hypoxia: Secondary | ICD-10-CM | POA: Diagnosis present

## 2021-03-17 DIAGNOSIS — Z7952 Long term (current) use of systemic steroids: Secondary | ICD-10-CM | POA: Diagnosis not present

## 2021-03-17 DIAGNOSIS — Z79890 Hormone replacement therapy (postmenopausal): Secondary | ICD-10-CM | POA: Diagnosis not present

## 2021-03-17 DIAGNOSIS — R41841 Cognitive communication deficit: Secondary | ICD-10-CM | POA: Diagnosis not present

## 2021-03-17 DIAGNOSIS — J8489 Other specified interstitial pulmonary diseases: Secondary | ICD-10-CM | POA: Diagnosis not present

## 2021-03-17 DIAGNOSIS — Z741 Need for assistance with personal care: Secondary | ICD-10-CM | POA: Diagnosis not present

## 2021-03-17 DIAGNOSIS — Z7982 Long term (current) use of aspirin: Secondary | ICD-10-CM

## 2021-03-17 DIAGNOSIS — G894 Chronic pain syndrome: Secondary | ICD-10-CM | POA: Diagnosis present

## 2021-03-17 DIAGNOSIS — F419 Anxiety disorder, unspecified: Secondary | ICD-10-CM | POA: Diagnosis not present

## 2021-03-17 DIAGNOSIS — I214 Non-ST elevation (NSTEMI) myocardial infarction: Secondary | ICD-10-CM

## 2021-03-17 DIAGNOSIS — R531 Weakness: Secondary | ICD-10-CM | POA: Diagnosis not present

## 2021-03-17 DIAGNOSIS — J189 Pneumonia, unspecified organism: Principal | ICD-10-CM

## 2021-03-17 DIAGNOSIS — R0602 Shortness of breath: Secondary | ICD-10-CM | POA: Diagnosis not present

## 2021-03-17 DIAGNOSIS — Z66 Do not resuscitate: Secondary | ICD-10-CM | POA: Diagnosis present

## 2021-03-17 DIAGNOSIS — Z7401 Bed confinement status: Secondary | ICD-10-CM | POA: Diagnosis not present

## 2021-03-17 DIAGNOSIS — Z79891 Long term (current) use of opiate analgesic: Secondary | ICD-10-CM

## 2021-03-17 DIAGNOSIS — M542 Cervicalgia: Secondary | ICD-10-CM | POA: Diagnosis not present

## 2021-03-17 DIAGNOSIS — H04129 Dry eye syndrome of unspecified lacrimal gland: Secondary | ICD-10-CM | POA: Diagnosis not present

## 2021-03-17 DIAGNOSIS — R059 Cough, unspecified: Secondary | ICD-10-CM | POA: Diagnosis not present

## 2021-03-17 DIAGNOSIS — R5383 Other fatigue: Secondary | ICD-10-CM | POA: Diagnosis not present

## 2021-03-17 DIAGNOSIS — Z91041 Radiographic dye allergy status: Secondary | ICD-10-CM

## 2021-03-17 DIAGNOSIS — K5900 Constipation, unspecified: Secondary | ICD-10-CM | POA: Diagnosis not present

## 2021-03-17 LAB — MAGNESIUM: Magnesium: 2 mg/dL (ref 1.7–2.4)

## 2021-03-17 LAB — CBC WITH DIFFERENTIAL/PLATELET
Abs Immature Granulocytes: 0.03 10*3/uL (ref 0.00–0.07)
Basophils Absolute: 0 10*3/uL (ref 0.0–0.1)
Basophils Relative: 0 %
Eosinophils Absolute: 0 10*3/uL (ref 0.0–0.5)
Eosinophils Relative: 0 %
HCT: 30.8 % — ABNORMAL LOW (ref 36.0–46.0)
Hemoglobin: 9.7 g/dL — ABNORMAL LOW (ref 12.0–15.0)
Immature Granulocytes: 1 %
Lymphocytes Relative: 7 %
Lymphs Abs: 0.4 10*3/uL — ABNORMAL LOW (ref 0.7–4.0)
MCH: 29.3 pg (ref 26.0–34.0)
MCHC: 31.5 g/dL (ref 30.0–36.0)
MCV: 93.1 fL (ref 80.0–100.0)
Monocytes Absolute: 0.2 10*3/uL (ref 0.1–1.0)
Monocytes Relative: 3 %
Neutro Abs: 4.9 10*3/uL (ref 1.7–7.7)
Neutrophils Relative %: 89 %
Platelets: 298 10*3/uL (ref 150–400)
RBC: 3.31 MIL/uL — ABNORMAL LOW (ref 3.87–5.11)
RDW: 19.2 % — ABNORMAL HIGH (ref 11.5–15.5)
WBC Morphology: INCREASED
WBC: 5.5 10*3/uL (ref 4.0–10.5)
nRBC: 0 % (ref 0.0–0.2)

## 2021-03-17 LAB — URINALYSIS, ROUTINE W REFLEX MICROSCOPIC
Bacteria, UA: NONE SEEN
Bilirubin Urine: NEGATIVE
Glucose, UA: NEGATIVE mg/dL
Ketones, ur: NEGATIVE mg/dL
Leukocytes,Ua: NEGATIVE
Nitrite: NEGATIVE
Protein, ur: 30 mg/dL — AB
Specific Gravity, Urine: 1.017 (ref 1.005–1.030)
pH: 7 (ref 5.0–8.0)

## 2021-03-17 LAB — COMPREHENSIVE METABOLIC PANEL
ALT: 33 U/L (ref 0–44)
AST: 27 U/L (ref 15–41)
Albumin: 3.1 g/dL — ABNORMAL LOW (ref 3.5–5.0)
Alkaline Phosphatase: 49 U/L (ref 38–126)
Anion gap: 11 (ref 5–15)
BUN: 15 mg/dL (ref 8–23)
CO2: 23 mmol/L (ref 22–32)
Calcium: 8 mg/dL — ABNORMAL LOW (ref 8.9–10.3)
Chloride: 100 mmol/L (ref 98–111)
Creatinine, Ser: 0.79 mg/dL (ref 0.44–1.00)
GFR, Estimated: >60 mL/min (ref >60)
Glucose, Bld: 120 mg/dL — ABNORMAL HIGH (ref 70–99)
Potassium: 3 mmol/L — ABNORMAL LOW (ref 3.5–5.1)
Sodium: 134 mmol/L — ABNORMAL LOW (ref 135–145)
Total Bilirubin: 0.8 mg/dL (ref 0.3–1.2)
Total Protein: 7.1 g/dL (ref 6.5–8.1)

## 2021-03-17 LAB — STREP PNEUMONIAE URINARY ANTIGEN: Strep Pneumo Urinary Antigen: NEGATIVE

## 2021-03-17 LAB — RESP PANEL BY RT-PCR (FLU A&B, COVID) ARPGX2
Influenza A by PCR: NEGATIVE
Influenza B by PCR: NEGATIVE
SARS Coronavirus 2 by RT PCR: NEGATIVE

## 2021-03-17 LAB — LACTIC ACID, PLASMA: Lactic Acid, Venous: 1.5 mmol/L (ref 0.5–1.9)

## 2021-03-17 LAB — TROPONIN I (HIGH SENSITIVITY)
Troponin I (High Sensitivity): 97 ng/L — ABNORMAL HIGH (ref <18)
Troponin I (High Sensitivity): 112 ng/L (ref <18)
Troponin I (High Sensitivity): 90 ng/L — ABNORMAL HIGH (ref <18)
Troponin I (High Sensitivity): 104 ng/L (ref <18)

## 2021-03-17 LAB — BRAIN NATRIURETIC PEPTIDE: B Natriuretic Peptide: 398.8 pg/mL — ABNORMAL HIGH (ref 0.0–100.0)

## 2021-03-17 LAB — MRSA NEXT GEN BY PCR, NASAL: MRSA by PCR Next Gen: NOT DETECTED

## 2021-03-17 LAB — PROCALCITONIN: Procalcitonin: 1.9 ng/mL

## 2021-03-17 MED ORDER — SODIUM CHLORIDE 0.9 % IV SOLN
1.0000 g | Freq: Once | INTRAVENOUS | Status: AC
  Administered 2021-03-17: 1 g via INTRAVENOUS
  Filled 2021-03-17: qty 10

## 2021-03-17 MED ORDER — POLYETHYLENE GLYCOL 3350 17 G PO PACK
17.0000 g | PACK | Freq: Every day | ORAL | Status: DC | PRN
  Filled 2021-03-17: qty 1

## 2021-03-17 MED ORDER — METHYLPREDNISOLONE SODIUM SUCC 125 MG IJ SOLR
60.0000 mg | INTRAMUSCULAR | Status: DC
  Administered 2021-03-17: 60 mg via INTRAVENOUS
  Filled 2021-03-17: qty 2

## 2021-03-17 MED ORDER — POTASSIUM CHLORIDE 10 MEQ/100ML IV SOLN
10.0000 meq | INTRAVENOUS | Status: AC
  Administered 2021-03-17 (×2): 10 meq via INTRAVENOUS
  Filled 2021-03-17 (×2): qty 100

## 2021-03-17 MED ORDER — LEVOTHYROXINE SODIUM 25 MCG PO TABS
125.0000 ug | ORAL_TABLET | Freq: Every day | ORAL | Status: DC
  Administered 2021-03-18 – 2021-03-21 (×4): 125 ug via ORAL
  Filled 2021-03-17 (×4): qty 1

## 2021-03-17 MED ORDER — ALBUTEROL SULFATE (2.5 MG/3ML) 0.083% IN NEBU: 2.5000 mg | INHALATION_SOLUTION | RESPIRATORY_TRACT | Status: DC | PRN

## 2021-03-17 MED ORDER — DOCUSATE SODIUM 100 MG PO CAPS
100.0000 mg | ORAL_CAPSULE | Freq: Two times a day (BID) | ORAL | Status: DC
  Administered 2021-03-17 – 2021-03-21 (×8): 100 mg via ORAL
  Filled 2021-03-17 (×8): qty 1

## 2021-03-17 MED ORDER — ONDANSETRON HCL 4 MG/2ML IJ SOLN: 4.0000 mg | Freq: Four times a day (QID) | INTRAMUSCULAR | Status: DC | PRN

## 2021-03-17 MED ORDER — ACETAMINOPHEN 650 MG RE SUPP: 650.0000 mg | Freq: Four times a day (QID) | RECTAL | Status: DC | PRN

## 2021-03-17 MED ORDER — SODIUM CHLORIDE 0.9 % IV SOLN
500.0000 mg | INTRAVENOUS | Status: DC
  Administered 2021-03-18: 500 mg via INTRAVENOUS
  Filled 2021-03-17: qty 5

## 2021-03-17 MED ORDER — MORPHINE SULFATE (PF) 2 MG/ML IV SOLN: 2.0000 mg | INTRAVENOUS | Status: DC | PRN

## 2021-03-17 MED ORDER — FENTANYL 50 MCG/HR TD PT72
1.0000 | MEDICATED_PATCH | TRANSDERMAL | Status: DC
  Administered 2021-03-20: 1 via TRANSDERMAL
  Filled 2021-03-17: qty 1

## 2021-03-17 MED ORDER — OXYCODONE HCL 5 MG PO TABS: 5.0000 mg | ORAL_TABLET | ORAL | Status: DC | PRN

## 2021-03-17 MED ORDER — ENOXAPARIN SODIUM 40 MG/0.4ML IJ SOSY
40.0000 mg | PREFILLED_SYRINGE | INTRAMUSCULAR | Status: DC
  Administered 2021-03-17 – 2021-03-21 (×5): 40 mg via SUBCUTANEOUS
  Filled 2021-03-17 (×5): qty 0.4

## 2021-03-17 MED ORDER — POTASSIUM CHLORIDE 20 MEQ PO PACK
40.0000 meq | PACK | Freq: Every day | ORAL | Status: AC
  Administered 2021-03-17: 40 meq via ORAL
  Filled 2021-03-17: qty 2

## 2021-03-17 MED ORDER — SODIUM CHLORIDE 0.9 % IV SOLN: INTRAVENOUS | Status: DC

## 2021-03-17 MED ORDER — OXYCODONE-ACETAMINOPHEN 5-325 MG PO TABS
1.0000 | ORAL_TABLET | Freq: Four times a day (QID) | ORAL | Status: DC | PRN
  Administered 2021-03-17 – 2021-03-21 (×6): 1 via ORAL
  Filled 2021-03-17 (×6): qty 1

## 2021-03-17 MED ORDER — OMEPRAZOLE MAGNESIUM 20 MG PO TBEC: 20.0000 mg | DELAYED_RELEASE_TABLET | ORAL | Status: DC

## 2021-03-17 MED ORDER — FOLIC ACID 1 MG PO TABS
1.0000 mg | ORAL_TABLET | Freq: Every day | ORAL | Status: DC
  Administered 2021-03-17 – 2021-03-21 (×5): 1 mg via ORAL
  Filled 2021-03-17 (×5): qty 1

## 2021-03-17 MED ORDER — PANTOPRAZOLE SODIUM 20 MG PO TBEC
20.0000 mg | DELAYED_RELEASE_TABLET | Freq: Every day | ORAL | Status: DC
  Administered 2021-03-18 – 2021-03-21 (×4): 20 mg via ORAL
  Filled 2021-03-17 (×5): qty 1

## 2021-03-17 MED ORDER — LORAZEPAM 0.5 MG PO TABS
0.5000 mg | ORAL_TABLET | Freq: Two times a day (BID) | ORAL | Status: DC | PRN
  Administered 2021-03-19 – 2021-03-21 (×3): 0.5 mg via ORAL
  Filled 2021-03-17 (×3): qty 1

## 2021-03-17 MED ORDER — BISACODYL 5 MG PO TBEC: 5.0000 mg | DELAYED_RELEASE_TABLET | Freq: Every day | ORAL | Status: DC | PRN

## 2021-03-17 MED ORDER — ONDANSETRON HCL 4 MG PO TABS: 4.0000 mg | ORAL_TABLET | Freq: Four times a day (QID) | ORAL | Status: DC | PRN

## 2021-03-17 MED ORDER — BENAZEPRIL HCL 20 MG PO TABS
20.0000 mg | ORAL_TABLET | Freq: Every morning | ORAL | Status: DC
  Administered 2021-03-18 – 2021-03-21 (×4): 20 mg via ORAL
  Filled 2021-03-17 (×4): qty 1

## 2021-03-17 MED ORDER — OXYCODONE HCL 5 MG PO TABS
5.0000 mg | ORAL_TABLET | Freq: Four times a day (QID) | ORAL | Status: DC | PRN
  Administered 2021-03-17 – 2021-03-21 (×8): 5 mg via ORAL
  Filled 2021-03-17 (×8): qty 1

## 2021-03-17 MED ORDER — SODIUM CHLORIDE 0.9% FLUSH
3.0000 mL | Freq: Two times a day (BID) | INTRAVENOUS | Status: DC
  Administered 2021-03-17 – 2021-03-21 (×6): 3 mL via INTRAVENOUS

## 2021-03-17 MED ORDER — AZITHROMYCIN 250 MG PO TABS
500.0000 mg | ORAL_TABLET | Freq: Once | ORAL | Status: AC
  Administered 2021-03-17: 500 mg via ORAL
  Filled 2021-03-17: qty 2

## 2021-03-17 MED ORDER — AMLODIPINE BESYLATE 5 MG PO TABS
5.0000 mg | ORAL_TABLET | Freq: Every morning | ORAL | Status: DC
  Administered 2021-03-18 – 2021-03-19 (×2): 5 mg via ORAL
  Filled 2021-03-17 (×2): qty 1

## 2021-03-17 MED ORDER — ACETAMINOPHEN 325 MG PO TABS
650.0000 mg | ORAL_TABLET | Freq: Four times a day (QID) | ORAL | Status: DC | PRN
  Administered 2021-03-17 – 2021-03-21 (×5): 650 mg via ORAL
  Filled 2021-03-17 (×5): qty 2

## 2021-03-17 MED ORDER — GUAIFENESIN ER 600 MG PO TB12
600.0000 mg | ORAL_TABLET | Freq: Two times a day (BID) | ORAL | Status: DC | PRN
  Administered 2021-03-19 – 2021-03-20 (×2): 600 mg via ORAL
  Filled 2021-03-17 (×2): qty 1

## 2021-03-17 MED ORDER — OXYCODONE-ACETAMINOPHEN 10-325 MG PO TABS: 1.0000 | ORAL_TABLET | Freq: Four times a day (QID) | ORAL | Status: DC | PRN

## 2021-03-17 MED ORDER — SODIUM CHLORIDE 0.9 % IV SOLN
2.0000 g | INTRAVENOUS | Status: AC
  Administered 2021-03-18 – 2021-03-21 (×4): 2 g via INTRAVENOUS
  Filled 2021-03-17 (×4): qty 20

## 2021-03-17 MED ORDER — ACETAMINOPHEN 325 MG PO TABS
650.0000 mg | ORAL_TABLET | Freq: Once | ORAL | Status: AC
  Administered 2021-03-17: 650 mg via ORAL
  Filled 2021-03-17: qty 2

## 2021-03-17 MED ORDER — ASPIRIN EC 81 MG PO TBEC
81.0000 mg | DELAYED_RELEASE_TABLET | Freq: Every day | ORAL | Status: DC
  Administered 2021-03-17 – 2021-03-21 (×5): 81 mg via ORAL
  Filled 2021-03-17 (×5): qty 1

## 2021-03-17 MED ORDER — HYDRALAZINE HCL 20 MG/ML IJ SOLN
5.0000 mg | INTRAMUSCULAR | Status: DC | PRN
Start: 2021-03-17 — End: 2021-03-21

## 2021-03-17 MED ORDER — OXYCODONE HCL 5 MG PO TABS
5.0000 mg | ORAL_TABLET | Freq: Once | ORAL | Status: AC
  Administered 2021-03-17: 5 mg via ORAL
  Filled 2021-03-17: qty 1

## 2021-03-17 NOTE — Assessment & Plan Note (Signed)
Continue Synthroid °

## 2021-03-17 NOTE — Assessment & Plan Note (Addendum)
-  Patient presenting with productive cough, mildly decreased oxygen saturation, and infiltrates in left lung on chest x-ray ?-This appears to be most likely community-acquired pneumonia; however, she recently had a Pseudomonas hardware infection in her leg and so is at risk for resistant infections.  ?-Influenza negative. ?-COVID-19 negative. ?-Gram stain, sputum and blood cultures are pending ?-Will order lower respiratory tract procalcitonin level.   >0.5 indicates infection and >>0.5 indicates more serious disease.  As the procalcitonin level normalizes, it will be reasonable to consider de-escalation of antibiotic coverage.  The sensitivity of procalcitonin is variable and should not be used alone to guide treatment. ?-CURB-65 score is 1 - will admit the patient to telemetry ?-Pneumonia Severity Index (PSI) is Class 3, 1% mortality. ?-Corticosteroids were given in stress dosing since she is on daily prednisone ?-Will start Azithromycin 500 mg IV daily and Rocephin and check MRSA PCR ?-Additional complicating factors include: hypoxia (reportedly into the 80s, documented to 93%), immunocompromise ?-LR @ 75cc/hr ?-Fever control ?-Repeat CBC in am ?-Will add albuterol PRN ?-Will add Mucinex for cough ?-PT/OT consults, encourage mobility ?

## 2021-03-17 NOTE — Assessment & Plan Note (Signed)
-  I have reviewed this patient in the [place] Controlled Substances Reporting System.  She is usually receiving medications from only one provider and appears to be taking them as prescribed. ?-She is at extraordinarily high risk of opioid misuse, diversion, or overdose. ?-Would be judicious in providing additional sedating medications and consider tapering to some extent as an outpatient. ?

## 2021-03-17 NOTE — ED Triage Notes (Signed)
Pt arrive from home for c/o SOB, fatigue and weakness for the past 3 days. ?

## 2021-03-17 NOTE — ED Provider Notes (Signed)
[facility] EMERGENCY DEPARTMENT Provider Note   CSN: [id number] Arrival date & time: 03/17/21  6503     History  Chief Complaint  Patient presents with   Shortness of Breath    Pt arrive from home for c/o SOB, fatigue and weakness for the past 3 days.    Jocelyn Bauer is a 88 y.o. female.  88 y/o female with hx of HTN, HLD, chronic pain, and recent hx of pseudomonal infected R ankle hardware s/p removal on 01/22/21 by Dr. Marcelino Scot (completed course of linezolid and ciprofloxacin mid-February ~3-4 weeks ago) presents to the ED for evaluation of SOB. Triage note references symptoms x 3 days, though patient states this may be up to a week; she is unsure. Does endorse increased DOE, mild lightheadedness. C/o associated sore throat and weakness. Yesterday, patient had to be lowered to the floor by her daughter after her legs gave out from under her; she was unable to get back up on her own. Patient denies fevers, syncope, chest pain, N/V, abdominal pain. Ambulates with a walker at baseline. No hx of chronic O2 requirement.   Shortness of Breath     Home Medications Prior to Admission medications   Medication Sig Start Date End Date Taking? Authorizing Provider  acetaminophen (TYLENOL) 325 MG tablet Take 2 tablets (650 mg total) by mouth 3 (three) times daily. Patient taking differently: Take 650 mg by mouth every 6 (six) hours as needed for moderate pain. 07/29/19   Allie Bossier, MD  amLODipine (NORVASC) 5 MG tablet Take 5 mg by mouth every morning.    [provider]  ascorbic acid (VITAMIN C) 500 MG tablet Take 1 tablet (500 mg total) by mouth daily. 07/29/19   Allie Bossier, MD  aspirin EC 81 MG tablet Take 81 mg by mouth daily. Swallow whole.    [provider]  benazepril (LOTENSIN) 20 MG tablet Take 20 mg by mouth every morning.    [provider]  bisacodyl (DULCOLAX) 5 MG EC tablet Take 1 tablet (5 mg total) by mouth daily as needed  for moderate constipation. 07/29/19   Allie Bossier, MD  cholecalciferol (VITAMIN D3) 25 MCG (1000 UNIT) tablet Take 1,000 Units by mouth daily.    [provider]  denosumab (PROLIA) 60 MG/ML SOSY injection Inject 60 mg into the skin every 6 (six) months.    [provider]  docusate sodium (COLACE) 100 MG capsule Take 2 capsules (200 mg total) by mouth 2 (two) times daily. Patient taking differently: Take 200 mg by mouth daily as needed for moderate constipation. 07/29/19   Allie Bossier, MD  fentaNYL (DURAGESIC) 50 MCG/HR Place 1 patch onto the skin every 3 (three) days. 07/29/19   Allie Bossier, MD  folic acid (FOLVITE) 1 MG tablet Take 1 mg by mouth daily.    [provider]  furosemide (LASIX) 20 MG tablet Take 20 mg by mouth every other day.    [provider]  levothyroxine (SYNTHROID) 125 MCG tablet Take 125 mcg by mouth daily before breakfast.    [provider]  LORazepam (ATIVAN) 0.5 MG tablet Take 0.5 mg by mouth 2 (two) times daily as needed for anxiety.    [provider]  methotrexate 50 MG/2ML injection every Wednesday. 0.9 mL - takes orally in juice    [provider]  omeprazole (PRILOSEC OTC) 20 MG tablet Take 20 mg by mouth every other day.  [provider]  oxyCODONE-acetaminophen (PERCOCET) 10-325 MG tablet Take 1 tablet by mouth every 6 (six) hours as needed for pain.    [provider]  predniSONE (DELTASONE) 5 MG tablet Take 1 tablet (5 mg total) by mouth daily. 02/15/12   Sheryle Hail, NP  vitamin B-12 (CYANOCOBALAMIN) 1000 MCG tablet Take 1,000 mcg by mouth daily.    [provider]      Allergies    Contrast media [iodinated contrast media], Tdap [tetanus-diphth-acell pertussis], and Streptococcus (diplococcus) pneumoniae [streptococci]    Review of Systems   Review of Systems  Respiratory:  Positive for shortness of breath.   Ten systems reviewed and are negative for  acute change, except as noted in the HPI.    Physical Exam Updated Vital Signs BP 121/62    Pulse 85    Temp 98.9 F (37.2 C) (Oral)    Resp 16    Ht '5\' 2"'$  (1.575 m)    Wt 62.1 kg    SpO2 98%    BMI 25.04 kg/m   Physical Exam Vitals and nursing note reviewed.  Constitutional:      General: She is not in acute distress.    Appearance: She is well-developed. She is not diaphoretic.     Comments: Nontoxic appearing.  HENT:     Head: Normocephalic and atraumatic.  Eyes:     General: No scleral icterus.    Conjunctiva/sclera: Conjunctivae normal.  Cardiovascular:     Rate and Rhythm: Normal rate and regular rhythm.     Pulses: Normal pulses.  Pulmonary:     Effort: Pulmonary effort is normal. No respiratory distress.     Breath sounds: Rhonchi present.     Comments: Scattered expiratory rhonchi without overt dyspnea, tachypnea. SpO2 86-88% on RA; improved to 93% on 3L via [place]. Abdominal:     General: There is no distension.     Palpations: Abdomen is soft.     Tenderness: There is no abdominal tenderness.  Musculoskeletal:     Cervical back: Normal range of motion.     Comments: Incision site to lateral R ankle appears well healed without erythema, heat to touch, lymphangitic streaking. No BLE pitting edema.  Skin:    General: Skin is warm and dry.     Coloration: Skin is not pale.     Findings: No erythema or rash.  Neurological:     Mental Status: She is alert and oriented to person, place, and time.     Coordination: Coordination normal.     Comments: Moving all extremities spontaneously.  Psychiatric:        Behavior: Behavior normal.    ED Results / Procedures / Treatments   Labs (all labs ordered are listed, but only abnormal results are displayed) Labs Reviewed  CBC WITH DIFFERENTIAL/PLATELET - Abnormal; Notable for the following components:      Result Value   RBC 3.31 (*)    Hemoglobin 9.7 (*)    HCT 30.8 (*)    RDW 19.2 (*)    Lymphs Abs 0.4 (*)    All  other components within normal limits  COMPREHENSIVE METABOLIC PANEL - Abnormal; Notable for the following components:   Sodium 134 (*)    Potassium 3.0 (*)    Glucose, Bld 120 (*)    Calcium 8.0 (*)    Albumin 3.1 (*)    All other components within normal limits  URINALYSIS, ROUTINE W REFLEX MICROSCOPIC - Abnormal; Notable for the following  components:   Hgb urine dipstick MODERATE (*)    Protein, ur 30 (*)    All other components within normal limits  BRAIN NATRIURETIC PEPTIDE - Abnormal; Notable for the following components:   B Natriuretic Peptide 398.8 (*)    All other components within normal limits  TROPONIN I (HIGH SENSITIVITY) - Abnormal; Notable for the following components:   Troponin I (High Sensitivity) 97 (*)    All other components within normal limits  TROPONIN I (HIGH SENSITIVITY) - Abnormal; Notable for the following components:   Troponin I (High Sensitivity) 112 (*)    All other components within normal limits  RESP PANEL BY RT-PCR (FLU A&B, COVID) ARPGX2  LACTIC ACID, PLASMA  MAGNESIUM    EKG EKG Interpretation  Date/Time:  Saturday March 17 2021 06:48:41 EST Ventricular Rate:  88 PR Interval:  167 QRS Duration: 96 QT Interval:  390 QTC Calculation: 472 R Axis:   9 Text Interpretation: Sinus rhythm Atrial premature complexes Borderline repolarization abnormality PACs are new Confirmed by Isla Pence [phone]) on 03/17/2021 7:24:55 AM  Radiology DG Chest Portable 1 View  Result Date: 03/17/2021 CLINICAL DATA:  88 year old female with history of shortness of breath, fatigue and weakness for the past 3 days. EXAM: PORTABLE CHEST 1 VIEW COMPARISON:  Chest x-ray 07/26/2019. FINDINGS: Widespread areas of interstitial prominence and patchy ill-defined opacities are noted throughout the left lung, most pronounced at the left lung base. No definite pleural effusions. No pneumothorax. No evidence of pulmonary edema. Heart size is normal. Upper mediastinal contours  are within normal limits. Atherosclerosis in the thoracic aorta. IMPRESSION: 1. The appearance of the chest is concerning for severe multilobar bronchopneumonia in the left lung, most evident at the left lung base. 2. Aortic atherosclerosis. Electronically Signed   By: Vinnie Langton M.D.   On: 03/17/2021 09:28    Procedures .Critical Care Performed by: Antonietta Breach, PA-C Authorized by: Antonietta Breach, PA-C   Critical care provider statement:    Critical care time (minutes):  35   Critical care time was exclusive of:  Separately billable procedures and treating other patients   Critical care was necessary to treat or prevent imminent or life-threatening deterioration of the following conditions:  Respiratory failure (CAP w/hypoxemia)   Critical care was time spent personally by me on the following activities:  Development of treatment plan with patient or surrogate, discussions with consultants, evaluation of patient's response to treatment, examination of patient, ordering and review of laboratory studies, ordering and review of radiographic studies, ordering and performing treatments and interventions, pulse oximetry, re-evaluation of patient's condition, review of old charts and obtaining history from patient or surrogate   I assumed direction of critical care for this patient from another provider in my specialty: no     Care discussed with: admitting provider      Medications Ordered in ED Medications  acetaminophen (TYLENOL) tablet 650 mg (650 mg Oral Given 03/17/21 0728)  potassium chloride 10 mEq in 100 mL IVPB (10 mEq Intravenous New Bag/Given 03/17/21 1030)  potassium chloride (KLOR-CON) packet 40 mEq (40 mEq Oral Given 03/17/21 0848)  oxyCODONE (Oxy IR/ROXICODONE) immediate release tablet 5 mg (5 mg Oral Given 03/17/21 0847)  cefTRIAXone (ROCEPHIN) 1 g in sodium chloride 0.9 % 100 mL IVPB (1 g Intravenous New Bag/Given 03/17/21 1034)  azithromycin (ZITHROMAX) tablet 500 mg (500 mg Oral  Given 03/17/21 1032)    ED Course/ Medical Decision Making/ A&P Clinical Course as of 03/17/21 1138  Sat Mar 17, 2021  0832 Patient continues to complain of acute on chronic back pain.  She usually takes Percocet for this at home.  Patient ordered to receive 5 mg oxycodone as she already received 650 mg Tylenol. [KH]  1130 Troponin elevation favored to be 2/2 demand related to CAP and hypoxemia. Patient without active chest pain. EKG nonischemic w/PACs. [KH]    Clinical Course User Index [KH] Antonietta Breach, PA-C                           Medical Decision Making Amount and/or Complexity of Data Reviewed Labs: ordered. Radiology: ordered.  Risk OTC drugs. Prescription drug management. Decision regarding hospitalization.   This patient presents to the ED for concern of SOB, this involves an extensive number of treatment options, and is a complaint that carries with it a high risk of complications and morbidity.  The differential diagnosis includes PNA vs PTX vs PE vs symptomatic anemia vs deconditioning   Co morbidities that complicate the patient evaluation  HTN HLD   Additional history obtained:  Additional history obtained from son at bedside External records from outside source obtained and reviewed including outpatient Orthopedic visits wrt infected R ankle hardware   Lab Tests:  I Ordered, and personally interpreted labs.  The pertinent results include:  Troponin elevation (97 > 112) favored secondary to demand from hypoxemia. Stable anemia with bandemia suggestive of acute infection despite normal WBCs. Hypokalemia of 3.0 (chronic, stable).   Imaging Studies ordered:  I ordered imaging studies including CXR  I independently visualized and interpreted imaging which showed L multilobar bronchopneumonia I agree with the radiologist interpretation   Cardiac Monitoring:  The patient was maintained on a cardiac monitor.  I personally viewed and interpreted the cardiac  monitored which showed an underlying rhythm of: NSR   Medicines ordered and prescription drug management:  I ordered medication including Rocephin and Azithromycin for PNA, oral and IV K+ for hypokalemia. Given Tylenol and Oxycodone for chronic back pain.  Reevaluation of the patient after these medicines showed that the patient improved I have reviewed the patients home medicines and have made adjustments as needed   Test Considered:  CT chest   Critical Interventions:  Supplemental O2 for hypoxia IV abx   Consultations Obtained:  I requested consultation with the hospitalist service,  and discussed lab and imaging findings as well as pertinent plan - they will assess the patient in the ED for admission.   Reevaluation:  After the interventions noted above, I reevaluated the patient and found that they have : remained stable   Social Determinants of Health:  Ambulates with walker   Dispostion:  After consideration of the diagnostic results and the patients response to treatment, I feel that the patent would benefit from admission for IV abx for coverage of multilobar PNA. Presently satting well on supplemental O2. No hx of chronic O2 requirement. Dr. Lorin Mercy of [facility] to admit.          Final Clinical Impression(s) / ED Diagnoses Final diagnoses:  Community acquired pneumonia of left lung, unspecified part of lung  Hypoxemia  NSTEMI (non-ST elevated myocardial infarction) [facility])    Rx / DC Orders ED Discharge Orders     None         Antonietta Breach, PA-C 03/17/21 1138    Isla Pence, MD 03/17/21 1150

## 2021-03-17 NOTE — Assessment & Plan Note (Signed)
-  Completed treatment <1 month ago ?-No apparent infection at this time on the R leg ?-She does have a superficial ulceration on her L metatarsal head and I have encouraged family to closely monitor this ?

## 2021-03-17 NOTE — Assessment & Plan Note (Signed)
-  Continue Norvasc, Lotensin ?

## 2021-03-17 NOTE — H&P (Signed)
History and Physical    Patient: Jocelyn Bauer DQQ:[id number] DOB: [birth year] DOA: 03/17/2021 DOS: the patient was seen and examined on 03/17/2021 PCP: [name], MD  Patient coming from: Home - lives with daughter; NOK: Daughter, [name], [phone]   Chief Complaint: SOB  HPI: Jocelyn Bauer is a 88 y.o. female with medical history significant of chronic pain; HTN; HLD; hypothyroidism; and recent (01/22/21) admission for infected R leg hardware s/p removal that was treated with linezolid and cipro (no longer taking as of mid-February) presenting with SOB, fatigue. She had been feeling better but this week has been too weak to stand and do her usual activities.  She collapsed to the floor once (without injury) and family had to help her up.  She has been coughing a lot, productive of yellowish sputum.  She doesn't think she has had fever.  She felt like she did a couple of years ago when she had PNA.  She does not wear home O2 and was feeling SOB.      ER Course:  More SOB for 3-7 days.  CXR with multilobar PNA.  86% on RA, improved with 3L [place] O2.  Started Rocephin, Azithro.     Review of Systems: As mentioned in the history of present illness. All other systems reviewed and are negative. Past Medical History:  Diagnosis Date   Anxiety    HX OF PANIC ATTACKS   Arthritis    RA   Chronic neck and back pain    Chronic pain    Chronic, continuous use of opioids 07/28/2019   Elbow locking    due to RA- elbow with limited extension   GERD (gastroesophageal reflux disease)    Headache    Hyperlipidemia    Hypertension    Hypothyroidism    Osteoarthritis    Osteoporosis    Pneumonia    Shortness of breath    occas   UTI (urinary tract infection)    Past Surgical History:  Procedure Laterality Date   CATARACT EXTRACTION Bilateral    COLONOSCOPY     ELBOW SURGERY Right    ELBOW SURGERY Left    ESOPHAGOGASTRODUODENOSCOPY     HARDWARE REMOVAL Right 07/13/2015    Procedure:  WITH HARDWARE REMOVAL REPAIR AS NECESSARY;  Surgeon: Roseanne Kaufman, MD;  Location: [facility];  Service: Orthopedics;  Laterality: Right;   HARDWARE REMOVAL Right 01/22/2021   Procedure: HARDWARE REMOVAL;  Surgeon: Altamese [place], MD;  Location: [facility];  Service: Orthopedics;  Laterality: Right;   JOINT REPLACEMENT  1998   left hip replacement and bilateral elbow replacements  about 15 yrs ago   ORIF ANKLE FRACTURE Bilateral 07/27/2019   Procedure: OPEN REDUCTION INTERNAL FIXATION (ORIF) ANKLE FRACTURE;  Surgeon: Altamese [place], MD;  Location: [facility];  Service: Orthopedics;  Laterality: Bilateral;   thyroidectomy     TOTAL ELBOW ARTHROPLASTY Right 07/13/2015   Procedure: RIGHT ELBOW REVISION TOTAL ELBOW ARTHROPLASTY ;  Surgeon: Roseanne Kaufman, MD;  Location: [place];  Service: Orthopedics;  Laterality: Right;   TOTAL HIP ARTHROPLASTY     TOTAL HIP REVISION  03/11/2011   Procedure: TOTAL HIP REVISION;  Surgeon: [name], MD;  Location: [facility];  Service: Orthopedics;  Laterality: Left;   TRICEPS TENDON REPAIR Right 07/13/2015   Procedure:  TRICEP RECONSTRUCTION REPAIR WITH ALLOGRAFT, ULNAR NERVE NEUROLYSIS;  Surgeon: Roseanne Kaufman, MD;  Location: [facility];  Service: Orthopedics;  Laterality: Right;   Social History:  reports that she has  quit smoking. Her smoking use included cigarettes. She has never used smokeless tobacco. She reports that she does not drink alcohol and does not use drugs.  Allergies  Allergen Reactions   Contrast Media [Iodinated Contrast Media] Other (See Comments)    Itching.( 13 hr pre medication regimen worked fine. )   Tdap [Tetanus-Diphth-Acell Pertussis] Hives   Streptococcus (Diplococcus) Pneumoniae [Streptococci] Other (See Comments)    Shortness of breath    Family History  Problem Relation Age of Onset   Heart attack Mother    Prostate cancer Father    Lung disease Brother    Heart attack Sister    COPD Sister    Cancer Sister        She does not know  which type of cancer    Prior to Admission medications   Medication Sig Start Date End Date Taking? Authorizing Provider  acetaminophen (TYLENOL) 325 MG tablet Take 2 tablets (650 mg total) by mouth 3 (three) times daily. Patient taking differently: Take 650 mg by mouth every 6 (six) hours as needed for moderate pain. 07/29/19   Allie Bossier, MD  amLODipine (NORVASC) 5 MG tablet Take 5 mg by mouth every morning.    [provider]  ascorbic acid (VITAMIN C) 500 MG tablet Take 1 tablet (500 mg total) by mouth daily. 07/29/19   Allie Bossier, MD  aspirin EC 81 MG tablet Take 81 mg by mouth daily. Swallow whole.    [provider]  benazepril (LOTENSIN) 20 MG tablet Take 20 mg by mouth every morning.    [provider]  bisacodyl (DULCOLAX) 5 MG EC tablet Take 1 tablet (5 mg total) by mouth daily as needed for moderate constipation. 07/29/19   Allie Bossier, MD  cholecalciferol (VITAMIN D3) 25 MCG (1000 UNIT) tablet Take 1,000 Units by mouth daily.    [provider]  denosumab (PROLIA) 60 MG/ML SOSY injection Inject 60 mg into the skin every 6 (six) months.    [provider]  docusate sodium (COLACE) 100 MG capsule Take 2 capsules (200 mg total) by mouth 2 (two) times daily. Patient taking differently: Take 200 mg by mouth daily as needed for moderate constipation. 07/29/19   Allie Bossier, MD  fentaNYL (DURAGESIC) 50 MCG/HR Place 1 patch onto the skin every 3 (three) days. 07/29/19   Allie Bossier, MD  folic acid (FOLVITE) 1 MG tablet Take 1 mg by mouth daily.    [provider]  furosemide (LASIX) 20 MG tablet Take 20 mg by mouth every other day.    [provider]  levothyroxine (SYNTHROID) 125 MCG tablet Take 125 mcg by mouth daily before breakfast.    [provider]  LORazepam (ATIVAN) 0.5 MG tablet Take 0.5 mg by mouth 2 (two) times daily as needed for anxiety.    [provider]  methotrexate 50 MG/2ML  injection every Wednesday. 0.9 mL - takes orally in juice    [provider]  omeprazole (PRILOSEC OTC) 20 MG tablet Take 20 mg by mouth every other day.    [provider]  oxyCODONE-acetaminophen (PERCOCET) 10-325 MG tablet Take 1 tablet by mouth every 6 (six) hours as needed for pain.    [provider]  predniSONE (DELTASONE) 5 MG tablet Take 1 tablet (5 mg total) by mouth daily. 02/15/12   Sheryle Hail, NP  vitamin B-12 (CYANOCOBALAMIN) 1000 MCG tablet Take 1,000 mcg by mouth daily.    [provider]    Physical Exam: Vitals:   03/17/21 0730 03/17/21 0745 03/17/21 0905 03/17/21 1100  BP: 113/88 130/66 121/62 102/61  Pulse: 94 90 85 86  Resp: (!) '22 16 16 17  '$ Temp:      TempSrc:      SpO2: 95% 98% 98% 98%  Weight:      Height:       General:  Appears calm and comfortable and is in NAD Eyes:  PERRL, EOMI, normal lids, iris ENT:  grossly normal hearing, lips & tongue, mmm Neck:  no LAD, masses or thyromegaly Cardiovascular:  RRR, no m/r/g. No LE edema.  Respiratory:   Diffuse left-sided rhonchi.  Normal to mildly increased respiratory effort on [place] O2. Abdomen:  soft, NT, ND Skin:  callus with superficial ulcer formation on plantar metatarsal head Musculoskeletal:  grossly normal tone BUE/BLE, good ROM, no bony abnormality Psychiatric:  grossly normal mood and affect, speech fluent and appropriate, AOx3 Neurologic:  CN 2-12 grossly intact, moves all extremities in coordinated fashion   Radiological Exams on Admission: Independently reviewed - see discussion in A/P where applicable  DG Chest Portable 1 View  Result Date: 03/17/2021 CLINICAL DATA:  88 year old female with history of shortness of breath, fatigue and weakness for the past 3 days. EXAM: PORTABLE CHEST 1 VIEW COMPARISON:  Chest x-ray 07/26/2019. FINDINGS: Widespread areas of interstitial prominence and patchy ill-defined opacities are noted throughout the left lung, most  pronounced at the left lung base. No definite pleural effusions. No pneumothorax. No evidence of pulmonary edema. Heart size is normal. Upper mediastinal contours are within normal limits. Atherosclerosis in the thoracic aorta. IMPRESSION: 1. The appearance of the chest is concerning for severe multilobar bronchopneumonia in the left lung, most evident at the left lung base. 2. Aortic atherosclerosis. Electronically Signed   By: Vinnie Langton M.D.   On: 03/17/2021 09:28    EKG: Independently reviewed.  NSR with rate 88; nonspecific ST changes with no evidence of acute ischemia   Labs on Admission: I have personally reviewed the available labs and imaging studies at the time of the admission.  Pertinent labs:    K+ 3.0 Glucose 120 Albumin 3.1 BNP 398.8 HS troponin 97 Lactate 1.5 WBC 5.5 Hgb 9.7 COVID/flu negative UA: moderate Hgb, 30 protein    Assessment and Plan: * Multifocal pneumonia -Patient presenting with productive cough, mildly decreased oxygen saturation, and infiltrates in left lung on chest x-ray -This appears to be most likely community-acquired pneumonia; however, she recently had a Pseudomonas hardware infection in her leg and so is at risk for resistant infections.  -Influenza negative. -COVID-19 negative. -Gram stain, sputum and blood cultures are pending -Will order lower respiratory tract procalcitonin level.   >0.5 indicates infection and >>0.5 indicates more serious disease.  As the procalcitonin level normalizes, it will be reasonable to consider de-escalation of antibiotic coverage.  The sensitivity of procalcitonin is variable and should not be used alone to guide treatment. -CURB-65 score is 1 - will admit the patient to telemetry -Pneumonia Severity Index (PSI) is Class 3, 1% mortality. -Corticosteroids were given in stress dosing since she is on daily prednisone -Will start Azithromycin 500 mg IV daily and Rocephin and check MRSA PCR -Additional  complicating factors include: hypoxia (reportedly into the 80s, documented to 93%), immunocompromise -LR @ 75cc/hr -Fever control -Repeat CBC in am -Will add albuterol PRN -Will add Mucinex for cough  DNR (do not resuscitate) -I have discussed code status with the  patient and her family and they are in agreement that the patient would not desire resuscitation and would prefer to die a natural death should that situation arise. -She will need a gold out of facility DNR form at the time of discharge  RA (rheumatoid arthritis) ([place]) -She is on Prolia, methotrexate, prednisone and so is immunocompromised -Needs stress-dosed steroids -Otherwise hold these medications  Infected hardware in right leg ([place]) -Completed treatment <1 month ago -No apparent infection at this time on the R leg -She does have a superficial ulceration on her L metatarsal head and I have encouraged family to closely monitor this  Chronic, continuous use of opioids -I have reviewed this patient in the [place] Controlled Substances Reporting System.  She is usually receiving medications from only one provider and appears to be taking them as prescribed. -She is at extraordinarily high risk of opioid misuse, diversion, or overdose. -Would be judicious in providing additional sedating medications and consider tapering to some extent as an outpatient.  Hypothyroidism -Continue Synthroid  Hypertension -Continue Norvasc, Lotensin      Advance Care Planning:   Code Status: DNR   Consults: PT/OT  DVT Prophylaxis: Lovenox  Family Communication: Son and daughter-in-law were present throughout evaluation  Severity of Illness: The appropriate patient status for this patient is INPATIENT. Inpatient status is judged to be reasonable and necessary in order to provide the required intensity of service to ensure the patient's safety. The patient's presenting symptoms, physical exam findings, and initial radiographic and  laboratory data in the context of their chronic comorbidities is felt to place them at high risk for further clinical deterioration. Furthermore, it is not anticipated that the patient will be medically stable for discharge from the hospital within 2 midnights of admission.   * I certify that at the point of admission it is my clinical judgment that the patient will require inpatient hospital care spanning beyond 2 midnights from the point of admission due to high intensity of service, high risk for further deterioration and high frequency of surveillance required.*  Author: Karmen Bongo, MD 03/17/2021 12:51 PM  For on call review [email or web address].

## 2021-03-17 NOTE — ED Notes (Signed)
Place a pur-wick on  withchux under patient ?

## 2021-03-17 NOTE — Assessment & Plan Note (Signed)
-  I have discussed code status with the patient and her family and they are in agreement that the patient would not desire resuscitation and would prefer to die a natural death should that situation arise. ?-She will need a gold out of facility DNR form at the time of discharge ?

## 2021-03-17 NOTE — Assessment & Plan Note (Signed)
-  She is on Prolia, methotrexate, prednisone and so is immunocompromised ?-Needs stress-dosed steroids ?-Otherwise hold these medications ?

## 2021-03-18 DIAGNOSIS — J189 Pneumonia, unspecified organism: Secondary | ICD-10-CM | POA: Diagnosis not present

## 2021-03-18 LAB — BASIC METABOLIC PANEL
Anion gap: 10 (ref 5–15)
BUN: 15 mg/dL (ref 8–23)
CO2: 21 mmol/L — ABNORMAL LOW (ref 22–32)
Calcium: 7.4 mg/dL — ABNORMAL LOW (ref 8.9–10.3)
Chloride: 103 mmol/L (ref 98–111)
Creatinine, Ser: 0.68 mg/dL (ref 0.44–1.00)
GFR, Estimated: >60 mL/min (ref >60)
Glucose, Bld: 160 mg/dL — ABNORMAL HIGH (ref 70–99)
Potassium: 3.1 mmol/L — ABNORMAL LOW (ref 3.5–5.1)
Sodium: 134 mmol/L — ABNORMAL LOW (ref 135–145)

## 2021-03-18 LAB — CBC
HCT: 27.7 % — ABNORMAL LOW (ref 36.0–46.0)
Hemoglobin: 8.8 g/dL — ABNORMAL LOW (ref 12.0–15.0)
MCH: 29.1 pg (ref 26.0–34.0)
MCHC: 31.8 g/dL (ref 30.0–36.0)
MCV: 91.7 fL (ref 80.0–100.0)
Platelets: 284 10*3/uL (ref 150–400)
RBC: 3.02 MIL/uL — ABNORMAL LOW (ref 3.87–5.11)
RDW: 19.2 % — ABNORMAL HIGH (ref 11.5–15.5)
WBC: 6.1 10*3/uL (ref 4.0–10.5)
nRBC: 0 % (ref 0.0–0.2)

## 2021-03-18 MED ORDER — AZITHROMYCIN 500 MG PO TABS
500.0000 mg | ORAL_TABLET | Freq: Every day | ORAL | Status: AC
Start: 2021-03-19 — End: 2021-03-21
  Administered 2021-03-19 – 2021-03-21 (×3): 500 mg via ORAL
  Filled 2021-03-18 (×3): qty 1

## 2021-03-18 MED ORDER — METHYLPREDNISOLONE SODIUM SUCC 125 MG IJ SOLR
60.0000 mg | INTRAMUSCULAR | Status: DC
  Administered 2021-03-18 – 2021-03-19 (×2): 60 mg via INTRAVENOUS
  Filled 2021-03-18 (×2): qty 2

## 2021-03-18 MED ORDER — POTASSIUM CHLORIDE 20 MEQ PO PACK
40.0000 meq | PACK | Freq: Every day | ORAL | Status: AC
  Administered 2021-03-18: 40 meq via ORAL
  Filled 2021-03-18: qty 2

## 2021-03-18 NOTE — Progress Notes (Signed)
PROGRESS NOTE    Jocelyn Bauer  GXQ:[id number] DOB: 13-Aug-1928 DOA: 03/17/2021 PCP: [name], MD     Brief Narrative:  Jocelyn Bauer is a 88 y.o. female with medical history significant of chronic pain; HTN; HLD; hypothyroidism; and recent (01/22/21) admission for infected R leg hardware s/p removal that was treated with linezolid and cipro (no longer taking as of mid-February) presenting with SOB, fatigue. She had been feeling better but this week has been too weak to stand and do her usual activities.  She collapsed to the floor once (without injury) and family had to help her up.  She has been coughing a lot, productive of yellowish sputum.  She doesn't think she has had fever.  She felt like she did a couple of years ago when she had PNA.  She does not wear home O2 and was feeling SOB.  CXR with multilobar PNA.  86% on RA, improved with 3L [place] O2.  Started Rocephin, Azithro.  New events last 24 hours / Subjective: Patient sitting in a recliner this morning.  Continues to have productive cough of yellow sputum, remains on 4 L nasal cannula O2.  She reports that she is very weak, PT recommending SNF placement.  Assessment & Plan:   Principal Problem:   Multifocal pneumonia Active Problems:   Hypertension   Hypothyroidism   Chronic, continuous use of opioids   Infected hardware in right leg ([facility])   RA (rheumatoid arthritis) ([facility])   DNR (do not resuscitate)   Multifocal pneumonia -CXR: The appearance of the chest is concerning for severe multilobar bronchopneumonia in the left lung, most evident at the left lung base. -Continue to trend procalcitonin 1.90 >>  -Continue Rocephin, azithromycin, solumedrol  -MRSA PCR negative  Acute hypoxemic respiratory failure -Secondary to above, currently requiring 4 L nasal cannula O2  Rheumatoid arthritis -Home medications on hold  Recent admission for infected hardware in right leg -Completed treatment in  February  Hypothyroidism -Continue Synthroid  Hypertension -Continue Norvasc, lotensin   Demand ischemia -Without concern for ACS, 97 >> 112 >> 90 >> 104   Hypokalemia -Replace, trend  DVT prophylaxis:  enoxaparin (LOVENOX) injection 40 mg Start: 03/17/21 1311  Code Status: DNR Family Communication: None at bedside  Disposition Plan:  Status is: Inpatient Remains inpatient appropriate because: Remains on nasal cannula O2, wean to room air as tolerated.  Remains on IV steroids and antibiotics.  SNF placement recommended by PT.  TOC consult placed   Antimicrobials:  Anti-infectives (From admission, onward)    Start     Dose/Rate Route Frequency Ordered Stop   03/18/21 0800  cefTRIAXone (ROCEPHIN) 2 g in sodium chloride 0.9 % 100 mL IVPB        2 g 200 mL/hr over 30 Minutes Intravenous Every 24 hours 03/17/21 1139 03/22/21 0759   03/18/21 0800  azithromycin (ZITHROMAX) 500 mg in sodium chloride 0.9 % 250 mL IVPB        500 mg 250 mL/hr over 60 Minutes Intravenous Every 24 hours 03/17/21 1139 03/22/21 0759   03/17/21 0945  cefTRIAXone (ROCEPHIN) 1 g in sodium chloride 0.9 % 100 mL IVPB        1 g 200 mL/hr over 30 Minutes Intravenous  Once 03/17/21 0934 03/17/21 1208   03/17/21 0945  azithromycin (ZITHROMAX) tablet 500 mg        500 mg Oral  Once 03/17/21 0934 03/17/21 1032        Objective: Vitals:  03/17/21 2000 03/18/21 0000 03/18/21 0400 03/18/21 0832  BP: 140/79 (!) 121/55 (!) 118/59 137/86  Pulse: 91 76 71 92  Resp: '14 20 17 18  '$ Temp: 98.2 F (36.8 C)  98.2 F (36.8 C) (!) 97.3 F (36.3 C)  TempSrc: Oral  Oral Oral  SpO2: 96% 97% 98% 91%  Weight: 63 kg     Height: '5\' 2"'$  (1.575 m)       Intake/Output Summary (Last 24 hours) at 03/18/2021 1227 Last data filed at 03/18/2021 1210 Gross per 24 hour  Intake 1788.82 ml  Output --  Net 1788.82 ml   Filed Weights   03/17/21 0647 03/17/21 2000  Weight: 62.1 kg 63 kg    Examination:  General exam:  Appears calm and comfortable  Respiratory system: Clear to auscultation. Respiratory effort normal. No respiratory distress. No conversational dyspnea.  On 4 L nasal cannula O2 Cardiovascular system: S1 & S2 heard, irregular rhythm, rate 90s. No murmurs. No pedal edema. Gastrointestinal system: Abdomen is nondistended, soft and nontender. Normal bowel sounds heard. Central nervous system: Alert and oriented.  Extremities: Symmetric in appearance  Skin: No rashes, lesions or ulcers on exposed skin  Psychiatry: Judgement and insight appear normal.   Data Reviewed: I have personally reviewed following labs and imaging studies  CBC: Recent Labs  Lab 03/17/21 0650 03/18/21 0004  WBC 5.5 6.1  NEUTROABS 4.9  --   HGB 9.7* 8.8*  HCT 30.8* 27.7*  MCV 93.1 91.7  PLT 298 947   Basic Metabolic Panel: Recent Labs  Lab 03/17/21 0650 03/17/21 0819 03/18/21 0004  NA 134*  --  134*  K 3.0*  --  3.1*  CL 100  --  103  CO2 23  --  21*  GLUCOSE 120*  --  160*  BUN 15  --  15  CREATININE 0.79  --  0.68  CALCIUM 8.0*  --  7.4*  MG  --  2.0  --    GFR: Estimated Creatinine Clearance: 40 mL/min (by C-G formula based on SCr of 0.68 mg/dL). Liver Function Tests: Recent Labs  Lab 03/17/21 0650  AST 27  ALT 33  ALKPHOS 49  BILITOT 0.8  PROT 7.1  ALBUMIN 3.1*   No results for input(s): LIPASE, AMYLASE in the last 168 hours. No results for input(s): AMMONIA in the last 168 hours. Coagulation Profile: No results for input(s): INR, PROTIME in the last 168 hours. Cardiac Enzymes: No results for input(s): CKTOTAL, CKMB, CKMBINDEX, TROPONINI in the last 168 hours. BNP (last 3 results) No results for input(s): PROBNP in the last 8760 hours. HbA1C: No results for input(s): HGBA1C in the last 72 hours. CBG: No results for input(s): GLUCAP in the last 168 hours. Lipid Profile: No results for input(s): CHOL, HDL, LDLCALC, TRIG, CHOLHDL, LDLDIRECT in the last 72 hours. Thyroid Function  Tests: No results for input(s): TSH, T4TOTAL, FREET4, T3FREE, THYROIDAB in the last 72 hours. Anemia Panel: No results for input(s): VITAMINB12, FOLATE, FERRITIN, TIBC, IRON, RETICCTPCT in the last 72 hours. Sepsis Labs: Recent Labs  Lab 03/17/21 6546 03/17/21 1621  PROCALCITON  --  1.90  LATICACIDVEN 1.5  --     Recent Results (from the past 240 hour(s))  Resp Panel by RT-PCR (Flu A&B, Covid) Nasopharyngeal Swab     Status: None   Collection Time: 03/17/21  7:00 AM   Specimen: Nasopharyngeal Swab; Nasopharyngeal(NP) swabs in vial transport medium  Result Value Ref Range Status   SARS Coronavirus 2 by  RT PCR NEGATIVE NEGATIVE Final    Comment: (NOTE) SARS-CoV-2 target nucleic acids are NOT DETECTED.  The SARS-CoV-2 RNA is generally detectable in upper respiratory specimens during the acute phase of infection. The lowest concentration of SARS-CoV-2 viral copies this assay can detect is 138 copies/mL. A negative result does not preclude SARS-Cov-2 infection and should not be used as the sole basis for treatment or other patient management decisions. A negative result may occur with  improper specimen collection/handling, submission of specimen other than nasopharyngeal swab, presence of viral mutation(s) within the areas targeted by this assay, and inadequate number of viral copies(<138 copies/mL). A negative result must be combined with clinical observations, patient history, and epidemiological information. The expected result is Negative.  Fact Sheet for Patients:  EntrepreneurPulse.com.au  Fact Sheet for Healthcare Providers:  IncredibleEmployment.be  This test is no t yet approved or cleared by the [place] FDA and  has been authorized for detection and/or diagnosis of SARS-CoV-2 by FDA under an Emergency Use Authorization (EUA). This EUA will remain  in effect (meaning this test can be used) for the duration of the COVID-19  declaration under Section 564(b)(1) of the Act, 21 U.S.C.section 360bbb-3(b)(1), unless the authorization is terminated  or revoked sooner.       Influenza A by PCR NEGATIVE NEGATIVE Final   Influenza B by PCR NEGATIVE NEGATIVE Final    Comment: (NOTE) The Xpert Xpress SARS-CoV-2/FLU/RSV plus assay is intended as an aid in the diagnosis of influenza from Nasopharyngeal swab specimens and should not be used as a sole basis for treatment. Nasal washings and aspirates are unacceptable for Xpert Xpress SARS-CoV-2/FLU/RSV testing.  Fact Sheet for Patients: EntrepreneurPulse.com.au  Fact Sheet for Healthcare Providers: IncredibleEmployment.be  This test is not yet approved or cleared by the [place] FDA and has been authorized for detection and/or diagnosis of SARS-CoV-2 by FDA under an Emergency Use Authorization (EUA). This EUA will remain in effect (meaning this test can be used) for the duration of the COVID-19 declaration under Section 564(b)(1) of the Act, 21 U.S.C. section 360bbb-3(b)(1), unless the authorization is terminated or revoked.  Performed at [facility], [place] [address], [place], [place] [address]   MRSA Next Gen by PCR, Nasal     Status: None   Collection Time: 03/17/21  4:21 PM   Specimen: Nasal Mucosa; Nasal Swab  Result Value Ref Range Status   MRSA by PCR Next Gen NOT DETECTED NOT DETECTED Final    Comment: (NOTE) The GeneXpert MRSA Assay (FDA approved for NASAL specimens only), is one component of a comprehensive MRSA colonization surveillance program. It is not intended to diagnose MRSA infection nor to guide or monitor treatment for MRSA infections. Test performance is not FDA approved in patients less than 63 years old. Performed at [facility], [place] [address], [place],  [address]       Radiology Studies: DG Chest Portable 1 View  Result Date: 03/17/2021 CLINICAL DATA:  88 year old  female with history of shortness of breath, fatigue and weakness for the past 3 days. EXAM: PORTABLE CHEST 1 VIEW COMPARISON:  Chest x-ray 07/26/2019. FINDINGS: Widespread areas of interstitial prominence and patchy ill-defined opacities are noted throughout the left lung, most pronounced at the left lung base. No definite pleural effusions. No pneumothorax. No evidence of pulmonary edema. Heart size is normal. Upper mediastinal contours are within normal limits. Atherosclerosis in the thoracic aorta. IMPRESSION: 1. The appearance of the chest is concerning for  severe multilobar bronchopneumonia in the left lung, most evident at the left lung base. 2. Aortic atherosclerosis. Electronically Signed   By: Vinnie Langton M.D.   On: 03/17/2021 09:28      Scheduled Meds:  amLODipine  5 mg Oral q morning   aspirin EC  81 mg Oral Daily   benazepril  20 mg Oral q morning   docusate sodium  100 mg Oral BID   enoxaparin (LOVENOX) injection  40 mg Subcutaneous Q24H   [START ON 03/20/2021] fentaNYL  1 patch Transdermal U93A   folic acid  1 mg Oral Daily   levothyroxine  125 mcg Oral QAC breakfast   methylPREDNISolone (SOLU-MEDROL) injection  60 mg Intravenous Q24H   pantoprazole  20 mg Oral Daily   sodium chloride flush  3 mL Intravenous Q12H   Continuous Infusions:  sodium chloride Stopped (03/18/21 1209)   azithromycin 500 mg (03/18/21 0934)   cefTRIAXone (ROCEPHIN)  IV 2 g (03/18/21 0821)     LOS: 1 day     Dessa Phi, DO [facility] 03/18/2021, 12:27 PM   Available via Epic secure chat 7am-7pm After these hours, please refer to coverage provider listed on [email or web address]

## 2021-03-18 NOTE — Evaluation (Signed)
Physical Therapy Evaluation ?Patient Details ?Name: Jocelyn Bauer ?MRN: [id number] ?DOB: Dec 11, 1928 ?Today's Date: 03/18/2021 ? ?History of Present Illness ? Patient is a 88 y/o female who presents on 03/17/21 with SOB, fatigue, weakness and recent fall. CXR-multilobal PNA. Recent admission 1/16 for infected RLE hardware. PMH includes HTN, OA, chronic pain, bil total elbow arthroplasties, Bil THAs.  ?Clinical Impression ? Patient presents with generalized weakness, dyspnea on exertion, impaired balance, fatigue and impaired mobility s/p above. Pt lives at home with daughter and uses RW for ambulation; able to perform some ADLs at baseline. Reports hx of falls. Today, pt requires Min-Mod A for bed mobility, transfers and short distance ambulation with use of RW for support. Noted to have 2/4 DOE, fatigues and anxiety with mobility due to fear of falling. Sp02 likely dropping to mid 80s on RA during session. Pt continues to be a high fall risk and not safe to be home alone (daughter works) so recommending SNF to maximize independence and mobility prior to return home. Will follow acutely.   ?   ? ?Recommendations for follow up therapy are one component of a multi-disciplinary discharge planning process, led by the attending physician.  Recommendations may be updated based on patient status, additional functional criteria and insurance authorization. ? ?Follow Up Recommendations Skilled nursing-short term rehab (<3 hours/day) ? ?  ?Assistance Recommended at Discharge Frequent or constant Supervision/Assistance  ?Patient can return home with the following ? Help with stairs or ramp for entrance;Assist for transportation;Assistance with cooking/housework;A little help with bathing/dressing/bathroom;A lot of help with walking and/or transfers ? ?  ?Equipment Recommendations None recommended by PT  ?Recommendations for Other Services ?    ?  ?Functional Status Assessment Patient has had a recent decline in their functional  status and demonstrates the ability to make significant improvements in function in a reasonable and predictable amount of time.  ? ?  ?Precautions / Restrictions Precautions ?Precautions: Fall;Other (comment) ?Precaution Comments: watch 02 ?Restrictions ?Weight Bearing Restrictions: No  ? ?  ? ?Mobility ? Bed Mobility ?Overal bed mobility: Needs Assistance ?Bed Mobility: Rolling, Sidelying to Sit ?Rolling: Min assist ?Sidelying to sit: Min assist, HOB elevated ?  ?  ?  ?General bed mobility comments: Cues for technique and assist with trunk to get to EOB. ?  ? ?Transfers ?Overall transfer level: Needs assistance ?Equipment used: Rolling walker (2 wheels) ?Transfers: Sit to/from Stand, Bed to chair/wheelchair/BSC ?Sit to Stand: Min assist, Mod assist ?  ?Step pivot transfers: Min assist, +2 physical assistance ?  ?  ?  ?General transfer comment: INitially Min A to power to standing degressing to mod A due to fatigue/weakness. Stood from [employer], from chair x3. Able to side step to chair with Min A of 2, fear of falling and bil knee instability noted. ?  ? ?Ambulation/Gait ?Ambulation/Gait assistance: Min assist, +2 safety/equipment ?Gait Distance (Feet): 7 Feet ?Assistive device: Rolling walker (2 wheels) ?Gait Pattern/deviations: Decreased step length - right, Decreased step length - left, Trunk flexed, Decreased stride length ?Gait velocity: decreased ?  ?  ?General Gait Details: Able to take a few steps with use of RW and Min A for balance; difficulty using LUE due to elbow surgery, barely any weight through LUE. Fatigues. + productive cough and Sp02 with not the best pleth but likley dropped to mid 80s on RA. Donned 4L post session. ? ?Stairs ?  ?  ?  ?  ?  ? ?Wheelchair Mobility ?  ? ?Modified Rankin (Stroke  Patients Only) ?  ? ?  ? ?Balance Overall balance assessment: Needs assistance, History of Falls ?Sitting-balance support: Feet supported, No upper extremity supported ?Sitting balance-Leahy Scale: Fair ?  ?   ?Standing balance support: During functional activity, Reliant on assistive device for balance ?Standing balance-Leahy Scale: Poor ?  ?  ?  ?  ?  ?  ?  ?  ?  ?  ?  ?  ?   ? ? ? ?Pertinent Vitals/Pain Pain Assessment ?Pain Assessment: No/denies pain  ? ? ?Home Living Family/patient expects to be discharged to:: Private residence ?Living Arrangements: Children (daughter works) ?Available Help at Discharge: Family;Available PRN/intermittently ?Type of Home: House ?Home Access: Stairs to enter ?Entrance Stairs-Rails: Can reach both ?Entrance Stairs-Number of Steps: 4 ?  ?Home Layout: One level ?Home Equipment: Conservation officer, nature (2 wheels);Cane - single point;BSC/3in1;Shower seat ?   ?  ?Prior Function Prior Level of Function : Needs assist ?  ?  ?  ?Physical Assist : Mobility (physical);ADLs (physical) ?  ?ADLs (physical): IADLs;Bathing ?Mobility Comments: USes RW for ambulation. Reports a few falls recently. ?ADLs Comments: Needs assist for IADls and ADls sometimes. ?  ? ? ?Hand Dominance  ? Dominant Hand: Right ? ?  ?Extremity/Trunk Assessment  ? Upper Extremity Assessment ?Upper Extremity Assessment: Defer to OT evaluation ?  ? ?Lower Extremity Assessment ?Lower Extremity Assessment: Generalized weakness ?  ? ?Cervical / Trunk Assessment ?Cervical / Trunk Assessment: Kyphotic  ?Communication  ? Communication: No difficulties  ?Cognition Arousal/Alertness: Awake/alert ?Behavior During Therapy: [facility] for tasks assessed/performed, Anxious ?Overall Cognitive Status: Within Functional Limits for tasks assessed ?  ?  ?  ?  ?  ?  ?  ?  ?  ?  ?  ?  ?  ?  ?  ?  ?General Comments: for basic mobility tasks. Anxious with mobility and fear of falling. ?  ?  ? ?  ?General Comments General comments (skin integrity, edema, etc.): Sp02 likely dropped to mid 80s on RA, not the best pleth. 2/4 DOE noted. ? ?  ?Exercises    ? ?Assessment/Plan  ?  ?PT Assessment Patient needs continued PT services  ?PT Problem List Decreased  strength;Decreased mobility;Decreased balance;Decreased activity tolerance;Cardiopulmonary status limiting activity;Decreased range of motion ? ?   ?  ?PT Treatment Interventions Therapeutic exercise;Patient/family education;Therapeutic activities;Functional mobility training;Gait training;Balance training;DME instruction;Stair training   ? ?PT Goals (Current goals can be found in the Care Plan section)  ?Acute Rehab PT Goals ?Patient Stated Goal: to get better and go home ?PT Goal Formulation: With patient ?Time For Goal Achievement: 04/01/21 ?Potential to Achieve Goals: Fair ? ?  ?Frequency Min 3X/week ?  ? ? ?Co-evaluation PT/OT/SLP Co-Evaluation/Treatment: Yes ?Reason for Co-Treatment: To address functional/ADL transfers;For patient/therapist safety ?PT goals addressed during session: Mobility/safety with mobility;Balance;Proper use of DME ?  ?  ? ? ?  ?AM-PAC PT "6 Clicks" Mobility  ?Outcome Measure Help needed turning from your back to your side while in a flat bed without using bedrails?: A Little ?Help needed moving from lying on your back to sitting on the side of a flat bed without using bedrails?: A Little ?Help needed moving to and from a bed to a chair (including a wheelchair)?: Total ?Help needed standing up from a chair using your arms (e.g., wheelchair or bedside chair)?: A Lot ?Help needed to walk in hospital room?: A Lot ?Help needed climbing 3-5 steps with a railing? : Total ?6 Click Score: 12 ? ?  ?  End of Session Equipment Utilized During Treatment: Gait belt ?Activity Tolerance: Patient limited by fatigue ?Patient left: in chair;with call bell/phone within reach;with chair alarm set ?Nurse Communication: Mobility status ?PT Visit Diagnosis: Muscle weakness (generalized) (M62.81);Unsteadiness on feet (R26.81);Difficulty in walking, not elsewhere classified (R26.2) ?  ? ?Time: 7972-8206 ?PT Time Calculation (min) (ACUTE ONLY): 23 min ? ? ?Charges:   PT Evaluation ?$PT Eval Moderate Complexity: 1  Mod ?  ?  ?   ? ? ?Marisa Severin, PT, DPT ?Acute Rehabilitation Services ?Pager [phone] ?Office [phone] ? ? ? ? ?[place] ?03/18/2021, 10:19 AM ? ?

## 2021-03-18 NOTE — Progress Notes (Signed)
HOSPITAL MEDICINE OVERNIGHT EVENT NOTE   ? ?Notified by nursing that most recent troponin for this patient is 104, minimally change from the previous value of 90. ? ?Nursing reports that patient is chest pain-free.  Patient continues to be monitored on telemetry. ? ?Elevated troponin likely secondary to demand ischemia from underlying infection.  Continue to monitor. ? ?Vernelle Emerald  MD ?[facility]  ? ? ? ? ? ? ? ? ? ? ?

## 2021-03-18 NOTE — Evaluation (Addendum)
Occupational Therapy Evaluation Patient Details Name: Jocelyn Bauer MRN: [id number] DOB: 09-14-1928 Today's Date: 03/18/2021   History of Present Illness Patient is a 88 y/o female who presents on 03/17/21 with SOB, fatigue, weakness and recent fall. CXR-multilobal PNA. Recent admission 1/16 for infected RLE hardware. PMH includes HTN, OA, chronic pain, bil total elbow arthroplasties, Bil THAs.   Clinical Impression   Pt lives with daughter who is gone most of the day, reports using RW at baseline and assist for ADLs (bathing & dressing). Currently pt mod A for ADLs, min A for bed mobility, and min-mod A for transfers. Pt fatigues quickly with short distance ambulation and is very anxious with movement, fear of falling. SpO2 dropping to mid 80's on RA with ambulation, poor wave form. Returned to 90's once seated, O2 reapplied. Pt presenting with impairments listed below, will follow acutely. Recommend SNF at d/c.      Recommendations for follow up therapy are one component of a multi-disciplinary discharge planning process, led by the attending physician.  Recommendations may be updated based on patient status, additional functional criteria and insurance authorization.   Follow Up Recommendations  Skilled nursing-short term rehab (<3 hours/day)    Assistance Recommended at Discharge Intermittent Supervision/Assistance  Patient can return home with the following A lot of help with walking and/or transfers;A lot of help with bathing/dressing/bathroom;Assistance with cooking/housework;Assist for transportation;Help with stairs or ramp for entrance    Functional Status Assessment  Patient has had a recent decline in their functional status and demonstrates the ability to make significant improvements in function in a reasonable and predictable amount of time.  Equipment Recommendations  None recommended by OT;Other (comment) (defer to next venue of care)    Recommendations for Other Services        Precautions / Restrictions Precautions Precautions: Fall;Other (comment) Precaution Comments: watch 02 Restrictions Weight Bearing Restrictions: No      Mobility Bed Mobility Overal bed mobility: Needs Assistance Bed Mobility: Rolling, Sidelying to Sit Rolling: Min assist Sidelying to sit: Min assist, HOB elevated       General bed mobility comments: Cues for technique and assist with trunk to get to EOB.    Transfers Overall transfer level: Needs assistance Equipment used: Rolling walker (2 wheels) Transfers: Sit to/from Stand, Bed to chair/wheelchair/BSC Sit to Stand: Min assist, Mod assist     Step pivot transfers: Min assist, +2 physical assistance            Balance Overall balance assessment: Needs assistance, History of Falls Sitting-balance support: Feet supported, No upper extremity supported Sitting balance-Leahy Scale: Fair     Standing balance support: During functional activity, Reliant on assistive device for balance Standing balance-Leahy Scale: Poor                             ADL either performed or assessed with clinical judgement   ADL Overall ADL's : Needs assistance/impaired Eating/Feeding: Set up;Sitting   Grooming: Set up;Sitting   Upper Body Bathing: Moderate assistance;Sitting   Lower Body Bathing: Moderate assistance;Sitting/lateral leans   Upper Body Dressing : Moderate assistance;Sitting   Lower Body Dressing: Moderate assistance;Sitting/lateral leans   Toilet Transfer: Minimal assistance;Moderate assistance;Stand-pivot;BSC/3in1;Rolling walker (2 wheels) Toilet Transfer Details (indicate cue type and reason): simulated to chair Toileting- Clothing Manipulation and Hygiene: Maximal assistance;Sitting/lateral lean       Functional mobility during ADLs: Minimal assistance;Moderate assistance;+2 for physical assistance;Rolling walker (2 wheels)  Vision   Vision Assessment?: No apparent visual  deficits     Perception     Praxis      Pertinent Vitals/Pain Pain Assessment Pain Assessment: No/denies pain     Hand Dominance Right   Extremity/Trunk Assessment Upper Extremity Assessment Upper Extremity Assessment: Generalized weakness;RUE deficits/detail;LUE deficits/detail RUE Deficits / Details: decreased ROM from prior elbow surgery, grip strength WFL LUE Deficits / Details: decreased ROM from prior elbow surgery, grip strength WFL   Lower Extremity Assessment Lower Extremity Assessment: Generalized weakness   Cervical / Trunk Assessment Cervical / Trunk Assessment: Kyphotic   Communication Communication Communication: No difficulties   Cognition Arousal/Alertness: Awake/alert Behavior During Therapy: WFL for tasks assessed/performed, Anxious Overall Cognitive Status: Within Functional Limits for tasks assessed                                 General Comments: anxious with mobility, afraid of falling despite reassurance     General Comments  SpO2 in mid 80's with mobility, poor wave form, pt fatigues quickly with sit to stand transfers and short distance ambulation    Exercises     Shoulder Instructions      Home Living Family/patient expects to be discharged to:: Private residence Living Arrangements: Children (daughter works) Available Help at Discharge: Family;Available PRN/intermittently Type of Home: House Home Access: Stairs to enter [employer] of Steps: 4 Entrance Stairs-Rails: Can reach both Home Layout: One level     Bathroom Shower/Tub: Teacher, early years/pre: Standard Bathroom Accessibility: Yes   Home Equipment: Conservation officer, nature (2 wheels);Cane - single point;BSC/3in1;Shower seat          Prior Functioning/Environment Prior Level of Function : Needs assist       Physical Assist : Mobility (physical);ADLs (physical)   ADLs (physical): Bathing;Dressing;IADLs Mobility Comments: USes RW for  ambulation. ADLs Comments: Needs assist for ADls.        OT Problem List: Decreased strength;Decreased range of motion;Decreased activity tolerance;Impaired balance (sitting and/or standing);Decreased safety awareness;Decreased knowledge of use of DME or AE;Decreased knowledge of precautions      OT Treatment/Interventions: Self-care/ADL training;Therapeutic exercise;Therapeutic activities;Patient/family education;Balance training    OT Goals(Current goals can be found in the care plan section) Acute Rehab OT Goals Patient Stated Goal: none stated OT Goal Formulation: With patient Time For Goal Achievement: 04/01/21 Potential to Achieve Goals: Fair ADL Goals Pt Will Perform Upper Body Dressing: with min assist;sitting Pt Will Perform Lower Body Dressing: with min assist;sit to/from stand;sitting/lateral leans Pt Will Transfer to Toilet: ambulating;bedside commode;stand pivot transfer;with min guard assist Pt Will Perform Tub/Shower Transfer: Shower transfer;Tub transfer;with min guard assist;rolling walker;ambulating;shower seat  OT Frequency: Min 2X/week    Co-evaluation PT/OT/SLP Co-Evaluation/Treatment: Yes Reason for Co-Treatment: To address functional/ADL transfers;For patient/therapist safety   OT goals addressed during session: ADL's and self-care      AM-PAC OT "6 Clicks" Daily Activity     Outcome Measure Help from another person eating meals?: None Help from another person taking care of personal grooming?: A Little Help from another person toileting, which includes using toliet, bedpan, or urinal?: A Lot Help from another person bathing (including washing, rinsing, drying)?: A Lot Help from another person to put on and taking off regular upper body clothing?: A Lot Help from another person to put on and taking off regular lower body clothing?: A Lot 6 Click Score: 15   End of Session Equipment  Utilized During Treatment: Gait belt;Rolling walker (2 wheels) Nurse  Communication: Mobility status  Activity Tolerance: Patient tolerated treatment well Patient left: in chair;with call bell/phone within reach;with chair alarm set  OT Visit Diagnosis: Unsteadiness on feet (R26.81);Other abnormalities of gait and mobility (R26.89);Muscle weakness (generalized) (M62.81);History of falling (Z91.81)                Time: 5638-7564 OT Time Calculation (min): 23 min Charges:  OT General Charges $OT Visit: 1 Visit OT Evaluation $OT Eval Moderate Complexity: 1 [address], OTD, OTR/L Acute Rehab [phone]) 832 - [place] 03/18/2021, 3:39 PM

## 2021-03-19 DIAGNOSIS — J189 Pneumonia, unspecified organism: Secondary | ICD-10-CM | POA: Diagnosis not present

## 2021-03-19 LAB — BASIC METABOLIC PANEL
Anion gap: 10 (ref 5–15)
BUN: 15 mg/dL (ref 8–23)
CO2: 22 mmol/L (ref 22–32)
Calcium: 7.8 mg/dL — ABNORMAL LOW (ref 8.9–10.3)
Chloride: 104 mmol/L (ref 98–111)
Creatinine, Ser: 0.68 mg/dL (ref 0.44–1.00)
GFR, Estimated: >60 mL/min (ref >60)
Glucose, Bld: 168 mg/dL — ABNORMAL HIGH (ref 70–99)
Potassium: 4.5 mmol/L (ref 3.5–5.1)
Sodium: 136 mmol/L (ref 135–145)

## 2021-03-19 LAB — PROCALCITONIN: Procalcitonin: 1.44 ng/mL

## 2021-03-19 MED ORDER — AMLODIPINE BESYLATE 10 MG PO TABS
10.0000 mg | ORAL_TABLET | Freq: Every morning | ORAL | Status: DC
  Administered 2021-03-20 – 2021-03-21 (×2): 10 mg via ORAL
  Filled 2021-03-19 (×2): qty 1

## 2021-03-19 NOTE — Progress Notes (Signed)
Arrived to patient's room patient up in chair. Notified nurse to reconsult IV team when patient is back in bed. VU. Fran Lowes, RN VAST ?

## 2021-03-19 NOTE — [place] FL2 (Signed)
?[place] MEDICAID FL2 LEVEL OF CARE SCREENING TOOL  ?  ? ?IDENTIFICATION  ?Patient Name: ?Jocelyn Bauer Birthdate: 1928-07-28 Sex: female Admission Date (Current Location): ?03/17/2021  ?[place] and [place] Number: ? [place] ?  Facility and Address:  ?[facility], [place] [address] [place], [place] [address] ?     Provider Number: ?[id number]  ?Attending Physician Name and Address:  ?Dessa Phi, DO ? Relative Name and Phone Number:  ?[name], [phone] ?   ?Current Level of Care: ?Hospital Recommended Level of Care: ?[place] Prior Approval Number: ?  ? ?Date Approved/Denied: ?  PASRR Number: ?[id number] A ? ?Discharge Plan: ?SNF ?  ? ?Current Diagnoses: ?Patient Active Problem List  ? Diagnosis Date Noted  ? Multifocal pneumonia 03/17/2021  ? RA (rheumatoid arthritis) ([place]) 03/17/2021  ? DNR (do not resuscitate) 03/17/2021  ? Infected hardware in right leg ([place])   ? 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)   ? Contusion of left elbow 06/14/2020  ? Closed trimalleolar fracture of right ankle   ? Chronic, continuous use of opioids 07/28/2019  ? Bilateral ankle fractures 07/26/2019  ? Right elbow pain 07/14/2015  ? Presence of right artificial elbow joint 07/13/2015  ? Hypertension   ? Hypothyroidism   ? Hyperlipidemia   ? GERD (gastroesophageal reflux disease)   ? Anxiety   ? Osteoporosis   ? Chronic neck and back pain   ? 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 ? ?Orientation RESPIRATION BLADDER Height & Weight   ?  ?Self, Time, Situation, Place ? O2 ([place] 4) Continent Weight: 138 lb 14.2 oz (63 kg) ?Height:  '5\' 2"'$  (157.5 cm)  ?BEHAVIORAL SYMPTOMS/MOOD NEUROLOGICAL BOWEL NUTRITION STATUS  ?    Continent Diet (See DC summary)  ?AMBULATORY STATUS COMMUNICATION OF NEEDS Skin   ?Extensive Assist Verbally Normal ?  ?  ?  ?    ?     ?     ? ? ?Personal Care Assistance Level of Assistance  ?Bathing, Feeding, Dressing Bathing Assistance: Maximum  assistance ?Feeding assistance: Limited assistance ?Dressing Assistance: Maximum assistance ?   ? ?Functional Limitations Info  ?Sight, Hearing, Speech Sight Info: Adequate ?Hearing Info: Adequate ?Speech Info: Adequate  ? ? ?SPECIAL CARE FACTORS FREQUENCY  ?PT (By licensed PT), OT (By licensed OT)   ?  ?PT Frequency: 5x week ?OT Frequency: 5x week ?  ?  ?  ?   ? ? ?Contractures Contractures Info: Not present  ? ? ?Additional Factors Info  ?Code Status, Allergies Code Status Info: DNR ?Allergies Info: Contrast Media (Iodinated Contrast Media)   Tdap (Tetanus-diphth-acell Pertussis)   Streptococcus (Diplococcus) Pneumoniae (Streptococci ?  ?  ?  ?   ? ?Current Medications (03/19/2021):  This is the current hospital active medication list ?Current Facility-Administered Medications  ?Medication Dose Route Frequency Provider Last Rate Last Admin  ? acetaminophen (TYLENOL) tablet 650 mg  650 mg Oral Q6H PRN Karmen Bongo, MD   650 mg at 03/19/21 0654  ? Or  ? acetaminophen (TYLENOL) suppository 650 mg  650 mg Rectal Q6H PRN Karmen Bongo, MD      ? albuterol (PROVENTIL) (2.5 MG/3ML) 0.083% nebulizer solution 2.5 mg  2.5 mg Nebulization Q2H PRN Karmen Bongo, MD      ? amLODipine (NORVASC) tablet 5 mg  5 mg Oral q morning Karmen Bongo, MD   5 mg at 03/19/21 6440  ? aspirin EC tablet 81 mg  81 mg Oral Daily Karmen Bongo, MD   81 mg at 03/19/21 6720  ? azithromycin (ZITHROMAX) tablet 500 mg  500 mg Oral Daily Dessa Phi, DO   500 mg at 03/19/21 9470  ? benazepril (LOTENSIN) tablet 20 mg  20 mg Oral q morning Karmen Bongo, MD   20 mg at 03/19/21 9628  ? bisacodyl (DULCOLAX) EC tablet 5 mg  5 mg Oral Daily PRN Karmen Bongo, MD      ? cefTRIAXone (ROCEPHIN) 2 g in sodium chloride 0.9 % 100 mL IVPB  2 g Intravenous Q24H Karmen Bongo, MD 200 mL/hr at 03/19/21 0840 2 g at 03/19/21 0840  ? docusate sodium (COLACE) capsule 100 mg  100 mg Oral BID Karmen Bongo, MD   100 mg at 03/19/21 3662  ? enoxaparin  (LOVENOX) injection 40 mg  40 mg Subcutaneous Q24H Karmen Bongo, MD   40 mg at 03/18/21 1336  ? [START ON 03/20/2021] fentaNYL (DURAGESIC) 50 MCG/HR 1 patch  1 patch Transdermal Kyra Searles, MD      ? folic acid (FOLVITE) tablet 1 mg  1 mg Oral Daily Karmen Bongo, MD   1 mg at 03/19/21 9476  ? guaiFENesin (MUCINEX) 12 hr tablet 600 mg  600 mg Oral BID PRN Karmen Bongo, MD   600 mg at 03/19/21 0848  ? hydrALAZINE (APRESOLINE) injection 5 mg  5 mg Intravenous Q4H PRN Karmen Bongo, MD      ? levothyroxine (SYNTHROID) tablet 125 mcg  125 mcg Oral QAC breakfast Karmen Bongo, MD   125 mcg at 03/19/21 0515  ? LORazepam (ATIVAN) tablet 0.5 mg  0.5 mg Oral BID PRN Karmen Bongo, MD   0.5 mg at 03/19/21 0848  ? methylPREDNISolone sodium succinate (SOLU-MEDROL) 125 mg/2 mL injection 60 mg  60 mg Intravenous Q24H Dessa Phi, DO   60 mg at 03/18/21 1337  ? ondansetron (ZOFRAN) tablet 4 mg  4 mg Oral Q6H PRN Karmen Bongo, MD      ? Or  ? ondansetron [facility]) injection 4 mg  4 mg Intravenous Q6H PRN Karmen Bongo, MD      ? oxyCODONE-acetaminophen (PERCOCET/ROXICET) 5-325 MG per tablet 1 tablet  1 tablet Oral Q6H PRN Karmen Bongo, MD   1 tablet at 03/18/21 1336  ? And  ? oxyCODONE (Oxy IR/ROXICODONE) immediate release tablet 5 mg  5 mg Oral Q6H PRN Karmen Bongo, MD   5 mg at 03/19/21 0654  ? pantoprazole (PROTONIX) EC tablet 20 mg  20 mg Oral Daily Karmen Bongo, MD   20 mg at 03/19/21 5465  ? polyethylene glycol (MIRALAX / GLYCOLAX) packet 17 g  17 g Oral Daily PRN Karmen Bongo, MD      ? sodium chloride flush (NS) 0.9 % injection 3 mL  3 mL Intravenous Q12H Karmen Bongo, MD   3 mL at 03/19/21 0840  ? ? ? ?Discharge Medications: ?Please see discharge summary for a list of discharge medications. ? ?Relevant Imaging Results: ? ?Relevant Lab Results: ? ? ?Additional Information ?[phone] ? ?Coralee Pesa, LCSWA ? ? ? ? ?

## 2021-03-19 NOTE — Progress Notes (Signed)
Physical Therapy Treatment ?Patient Details ?Name: Jocelyn Bauer ?MRN: [id number] ?DOB: 05-Jun-1928 ?Today's Date: 03/19/2021 ? ? ?History of Present Illness Patient is a 88 y/o female who presents on 03/17/21 with SOB, fatigue, weakness and recent fall. CXR-multilobal PNA. Recent admission 1/16 for infected RLE hardware. PMH includes HTN, OA, chronic pain, bil total elbow arthroplasties, Bil THAs. ? ?  ?PT Comments  ? ? Patient received in recliner. She is agreeable to PT session. Reports she just got up to use BSC. Patient performed seated LE strengthening exercises and performed STS x 5 reps from recliner. She was unable to march in place while standing. Declined ambulation this session. Stating "I don't think I can." Patient is making some progress with mobility and strength and will continue to benefit from skilled PT while here to improve safety and independence.  ?     ?Recommendations for follow up therapy are one component of a multi-disciplinary discharge planning process, led by the attending physician.  Recommendations may be updated based on patient status, additional functional criteria and insurance authorization. ? ?Follow Up Recommendations ? Skilled nursing-short term rehab (<3 hours/day) ?  ?  ?Assistance Recommended at Discharge Intermittent Supervision/Assistance  ?Patient can return home with the following Help with stairs or ramp for entrance;Assist for transportation;Assistance with cooking/housework;A little help with bathing/dressing/bathroom;A lot of help with walking and/or transfers ?  ?Equipment Recommendations ? None recommended by PT  ?  ?Recommendations for Other Services   ? ? ?  ?Precautions / Restrictions Precautions ?Precautions: Fall ?Restrictions ?Weight Bearing Restrictions: No  ?  ? ?Mobility ? Bed Mobility ?  ?  ?  ?  ?  ?  ?  ?General bed mobility comments: patient received up in recliner and remained in recliner ?  ? ?Transfers ?Overall transfer level: Needs  assistance ?Equipment used: Rolling walker (2 wheels) ?Transfers: Sit to/from Stand ?Sit to Stand: Min assist ?  ?  ?  ?  ?  ?General transfer comment: min assist needed for sit to stand x 5 reps. Unable to march in place in standing. Steady with static standing holding to RW. ?  ? ?Ambulation/Gait ?  ?  ?  ?  ?  ?  ?  ?General Gait Details: patient declined attempting to ambulate this session. ? ? ?Stairs ?  ?  ?  ?  ?  ? ? ?Wheelchair Mobility ?  ? ?Modified Rankin (Stroke Patients Only) ?  ? ? ?  ?Balance Overall balance assessment: Needs assistance, History of Falls ?Sitting-balance support: Feet supported ?Sitting balance-Leahy Scale: Good ?  ?  ?Standing balance support: Bilateral upper extremity supported, During functional activity, Reliant on assistive device for balance ?Standing balance-Leahy Scale: Poor ?  ?  ?  ?  ?  ?  ?  ?  ?  ?  ?  ?  ?  ? ?  ?Cognition Arousal/Alertness: Awake/alert ?Behavior During Therapy: [facility] for tasks assessed/performed ?Overall Cognitive Status: Within Functional Limits for tasks assessed ?  ?  ?  ?  ?  ?  ?  ?  ?  ?  ?  ?  ?  ?  ?  ?  ?General Comments: anxious with mobility, afraid of falling despite reassurance ?  ?  ? ?  ?Exercises Other Exercises ?Other Exercises: B LE LAQ, AP, heel slides, hip abd/add x 10 reps each ? ?  ?General Comments   ?  ?  ? ?Pertinent Vitals/Pain Pain Assessment ?Pain Assessment: Faces ?Faces Pain Scale:  Hurts little more ?Pain Location: B Ues ?Pain Descriptors / Indicators: Discomfort, Sore ?Pain Intervention(s): Monitored during session  ? ? ?Home Living   ?  ?  ?  ?  ?  ?  ?  ?  ?  ?   ?  ?Prior Function    ?  ?  ?   ? ?PT Goals (current goals can now be found in the care plan section) Acute Rehab PT Goals ?Patient Stated Goal: to go to rehab, then home ?PT Goal Formulation: With patient ?Time For Goal Achievement: 04/01/21 ?Potential to Achieve Goals: Good ?Progress towards PT goals: Progressing toward goals ? ?  ?Frequency ? ? ? Min  3X/week ? ? ? ?  ?PT Plan Current plan remains appropriate  ? ? ?Co-evaluation   ?  ?  ?  ?  ? ?  ?AM-PAC PT "6 Clicks" Mobility   ?Outcome Measure ? Help needed turning from your back to your side while in a flat bed without using bedrails?: A Little ?Help needed moving from lying on your back to sitting on the side of a flat bed without using bedrails?: A Little ?Help needed moving to and from a bed to a chair (including a wheelchair)?: A Lot ?Help needed standing up from a chair using your arms (e.g., wheelchair or bedside chair)?: A Little ?Help needed to walk in hospital room?: A Lot ?Help needed climbing 3-5 steps with a railing? : Total ?6 Click Score: 14 ? ?  ?End of Session Equipment Utilized During Treatment: Gait belt ?Activity Tolerance: Patient limited by fatigue;Patient limited by pain ?Patient left: in chair;with chair alarm set;with call bell/phone within reach ?Nurse Communication: Mobility status ?PT Visit Diagnosis: Muscle weakness (generalized) (M62.81);Unsteadiness on feet (R26.81);Difficulty in walking, not elsewhere classified (R26.2) ?  ? ? ?Time: 1601-0932 ?PT Time Calculation (min) (ACUTE ONLY): 20 min ? ?Charges:  $Therapeutic Activity: 8-22 mins          ?          ? ?Amanda Cockayne, PT, GCS ?03/19/21,1:28 PM ? ? ?

## 2021-03-19 NOTE — TOC Initial Note (Addendum)
Transition of Care [facility]) - Initial/Assessment Note    Patient Details  Name: Jocelyn Bauer MRN: [id number] Date of Birth: 29-Apr-1928  Transition of Care [facility]) CM/SW Contact:    Benard Halsted, [place] Phone Number: 03/19/2021, 11:06 AM  Clinical Narrative:                 11am-CSW received consult for possible SNF placement at time of discharge. CSW spoke with patient. Patient reported that her daughter  Patient expressed understanding of PT recommendation and is agreeable to SNF placement at time of discharge. Patient reports preference for only [facility]). CSW discussed insurance authorization process and will provide Medicare SNF ratings list. CSW will send out referrals for review.   11:22am-Sarah from [facility] called CSW back and stated they can accept patient when medically stable.   Skilled Nursing Rehab Facilities-   [email or web address]   Ratings out of 5 possible   Name Address  Phone # [place] Inspection Overall  [facility] [address], [place] '5 5 2 4  '$ [facility]  [address], [place] [phone] '4 2 5 5  '$ [facility] [place], [place] '4 1 1 1  '$ [place], [place] '2 2 4 4  '$ [facility] [address], [place] '1 1 2 1  '$ [facility] [place] '2 1 4 3  '$ [facility] [address] [place] '5 2 2 3  '$ [facility] [address], [place] '5 2 2 3  '$ [address] (Miguel Barrera) [place], [place] [phone] '5 1 2 2  '$ [facility] [phone], [place] [phone] '4 1 1 1  '$ [facility] [address], [facility] [phone] '4 1 2 1  '$ [facility] [place] [place], [place] [phone] '4 1 1 1          '$ [place]      [facility] [address], [place] '4 2 3 3  '$ [facility] [address] [place] '3 1 5 4  '$ [address], [place] [address], [place] [phone] '2 1 1 1  '$ [facility] [address], [employer] [phone] '2 2 3 3          '$ [address] (no [facility]) [address], [place] [phone] '4 5 5 5  '$ [facility] (No Humana) [address] [place], [place] '4 1 4 3  '$ [facility] (No U[facility]) [place], [place] [phone] '5 5 5 5  '$ [facility] [address], [place] [phone] '3 2 4 4  '$ [facility]in [facility]ock [address] [name] [phone] '3 3 4 4  '$ [facility] [address] [place] '1 1 2 1  '$ [place] [address] [place] [phone] '2 1 1 1  '$ [facility] [place], [place] '5 2 4 5  '$ [facility] [address] [place] '3 1 1 1  '$ [facility] [place], [place] '2 1 2 1          '$ [facility] [address], [place] [phone] '1 1 1 1  '$ [facility] [address], [place]  [phone] '2 4 2 2  '$ [facility] [address], [place] [phone] '5 2 3 4  '$ [place],  [place] [phone] '2 1 1 1  '$ [place] (No Humana) [address], [place] [phone] '2 1 3 2  '$ [facility] [address], [place] [phone] '3 1 1 1          '$ [facility] [place], [place] '5 4 5 5  '$ [facility])  [address], [place] '2 2 3 3  '$ [facility]) [place] [address], [place] '3 2 4 4  '$ [facility] [address] [place] '4 3 4 4  '$ [address] [place], [place] '3 3 1 1  '$ [facility]) [address] [place] [phone] '2 2 4 4      '$ Expected Discharge Plan: [place] Barriers to Discharge: Continued Medical Work up, SNF Pending bed offer   Patient Goals and CMS Choice Patient states their goals for this hospitalization and ongoing recovery are:: Rehab CMS Medicare.gov Compare Post Acute Care list provided to:: Patient Choice offered to / list presented to : Patient  Expected Discharge Plan and Services Expected Discharge Plan: [place] In-house Referral: Clinical Social Work   Post Acute Care Choice: [place] Living arrangements for the past 2 months: [place]                                      Prior Living Arrangements/Services Living arrangements for the past 2 months: Single Family Home Lives with:: Self Patient language and need for interpreter reviewed:: Yes Do you feel safe going back to the place where you live?: Yes      Need for Family Participation in Patient Care: Yes (Comment) Care giver support system in place?: Yes (comment)   Criminal Activity/Legal Involvement Pertinent to Current Situation/Hospitalization: No - Comment as needed  Activities of Daily Living Home Assistive Devices/Equipment: Dentures (specify type) ADL Screening (condition at time of admission) Patient's cognitive ability adequate to safely complete daily activities?: Yes Is the patient deaf or have difficulty hearing?: No Does the patient have difficulty seeing, even when wearing glasses/contacts?: No Does the patient have difficulty concentrating, remembering, or making decisions?: No Patient able to express need for assistance with ADLs?: Yes Does the patient have difficulty dressing or bathing?: Yes Independently performs ADLs?: No Communication: Independent Dressing (OT): Needs assistance Is this a change from baseline?: Pre-admission baseline Grooming: Needs assistance Is this a change from baseline?: Pre-admission baseline Feeding: Needs  assistance Is this a change from baseline?: Pre-admission baseline Bathing: Needs assistance Is this a change from baseline?: Pre-admission baseline Toileting: Needs assistance Is this a change from baseline?: Pre-admission baseline In/Out Bed: Needs assistance Is this a change from baseline?: Pre-admission baseline Walks in Home: Needs assistance Is this a change from baseline?: Pre-admission baseline Does the patient have difficulty walking or climbing stairs?: Yes Weakness of Legs: Both Weakness of Arms/Hands: Both  Permission Sought/Granted Permission sought to share information with : Facility Sport and exercise psychologist, Family Supports Permission granted to share information with : Yes, Verbal Permission Granted     Permission granted to share info w AGENCY: SNFs        Emotional Assessment Appearance:: Appears stated age Attitude/Demeanor/Rapport: Engaged Affect (typically observed): Accepting, Appropriate Orientation: : Oriented to Self, Oriented to Place, Oriented to  Time, Oriented to Situation Alcohol / Substance Use: Not Applicable Psych Involvement: No (  comment)  Admission diagnosis:  Hypoxemia [R09.02] NSTEMI (non-ST elevated myocardial infarction) ([place]) [I21.4] Multifocal pneumonia [J18.9] Community acquired pneumonia of left lung, unspecified part of lung [J18.9] Patient Active Problem List   Diagnosis Date Noted   Multifocal pneumonia 03/17/2021   RA (rheumatoid arthritis) ([place]) 03/17/2021   DNR (do not resuscitate) 03/17/2021   Infected hardware in right leg ([place])    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 [place]  [id number]: [phone]: [phone] [facility] [address]: [phone]: [phone]    Social Determinants of Health (SDOH) Interventions    Readmission Risk Interventions No flowsheet data found.

## 2021-03-19 NOTE — Progress Notes (Signed)
PROGRESS NOTE    Jocelyn Bauer  JGG:[id number] DOB: Nov 02, 1928 DOA: 03/17/2021 PCP: [name], MD     Brief Narrative:  Jocelyn Bauer is a 88 y.o. female with medical history significant of chronic pain; HTN; HLD; hypothyroidism; and recent (01/22/21) admission for infected R leg hardware s/p removal that was treated with linezolid and cipro (no longer taking as of mid-February) presenting with SOB, fatigue. She had been feeling better but this week has been too weak to stand and do her usual activities.  She collapsed to the floor once (without injury) and family had to help her up.  She has been coughing a lot, productive of yellowish sputum.  She doesn't think she has had fever.  She felt like she did a couple of years ago when she had PNA.  She does not wear home O2 and was feeling SOB.  CXR with multilobar PNA.  86% on RA, improved with 3L [place] O2.  Started Rocephin, Azithro.  New events last 24 hours / Subjective: Patient sitting in a recliner. No worsening SOB. Worked with PT and had rec for SNF which patient is agreeable to. On 4L O2 this morning.   Assessment & Plan:   Principal Problem:   Multifocal pneumonia Active Problems:   Hypertension   Hypothyroidism   Chronic, continuous use of opioids   Infected hardware in right leg (HCC)   RA (rheumatoid arthritis) (HCC)   DNR (do not resuscitate)   Multifocal pneumonia -CXR: The appearance of the chest is concerning for severe multilobar bronchopneumonia in the left lung, most evident at the left lung base. -Continue to trend procalcitonin 1.90 >> 1.44  -Continue Rocephin, azithromycin, solumedrol  -MRSA PCR negative  Acute hypoxemic respiratory failure -Secondary to above, currently requiring 4 L nasal cannula O2. Wean to room air as tolerated   Rheumatoid arthritis -Home medications on hold  Recent admission for infected hardware in right leg -Completed treatment in February  Hypothyroidism -Continue  Synthroid  Hypertension -Continue Norvasc, lotensin   Demand ischemia -Without concern for ACS, 97 >> 112 >> 90 >> 104    DVT prophylaxis:  enoxaparin (LOVENOX) injection 40 mg Start: 03/17/21 1311  Code Status: DNR Family Communication: None at bedside  Disposition Plan:  Status is: Inpatient Remains inpatient appropriate because: Remains on nasal cannula O2, wean to room air as tolerated.  Remains on IV steroids and antibiotics.  SNF placement pending.    Antimicrobials:  Anti-infectives (From admission, onward)    Start     Dose/Rate Route Frequency Ordered Stop   03/19/21 1000  azithromycin (ZITHROMAX) tablet 500 mg        500 mg Oral Daily 03/18/21 1329 03/22/21 0959   03/18/21 0800  cefTRIAXone (ROCEPHIN) 2 g in sodium chloride 0.9 % 100 mL IVPB        2 g 200 mL/hr over 30 Minutes Intravenous Every 24 hours 03/17/21 1139 03/22/21 0759   03/18/21 0800  azithromycin (ZITHROMAX) 500 mg in sodium chloride 0.9 % 250 mL IVPB  Status:  Discontinued        500 mg 250 mL/hr over 60 Minutes Intravenous Every 24 hours 03/17/21 1139 03/18/21 1328   03/17/21 0945  cefTRIAXone (ROCEPHIN) 1 g in sodium chloride 0.9 % 100 mL IVPB        1 g 200 mL/hr over 30 Minutes Intravenous  Once 03/17/21 0934 03/17/21 1208   03/17/21 0945  azithromycin (ZITHROMAX) tablet 500 mg  500 mg Oral  Once 03/17/21 0934 03/17/21 1032        Objective: Vitals:   03/18/21 2307 03/19/21 0400 03/19/21 0737 03/19/21 0800  BP: (!) 123/54 (!) 154/80 137/69 (!) 160/109  Pulse: 83 61 93 86  Resp: '16 15 17 20  '$ Temp: (!) 97.5 F (36.4 C) (!) 96.4 F (35.8 C) 98.3 F (36.8 C) 98.3 F (36.8 C)  TempSrc: Axillary Axillary Oral Oral  SpO2: 99% 100% 97% 97%  Weight:      Height:        Intake/Output Summary (Last 24 hours) at 03/19/2021 1148 Last data filed at 03/19/2021 0930 Gross per 24 hour  Intake 1390.96 ml  Output --  Net 1390.96 ml    Filed Weights   03/17/21 0647 03/17/21 2000   Weight: 62.1 kg 63 kg    Examination:  General exam: Appears calm and comfortable  Respiratory system: Clear to auscultation. Respiratory effort normal. No respiratory distress. No conversational dyspnea.  On 4 L nasal cannula O2 Cardiovascular system: S1 & S2 heard. No murmurs. No pedal edema. Gastrointestinal system: Abdomen is nondistended, soft and nontender. Normal bowel sounds heard. Central nervous system: Alert and oriented.  Extremities: Symmetric in appearance  Skin: No rashes, lesions or ulcers on exposed skin  Psychiatry: Judgement and insight appear normal.   Data Reviewed: I have personally reviewed following labs and imaging studies  CBC: Recent Labs  Lab 03/17/21 0650 03/18/21 0004  WBC 5.5 6.1  NEUTROABS 4.9  --   HGB 9.7* 8.8*  HCT 30.8* 27.7*  MCV 93.1 91.7  PLT 298 491    Basic Metabolic Panel: Recent Labs  Lab 03/17/21 0650 03/17/21 0819 03/18/21 0004 03/19/21 0104  NA 134*  --  134* 136  K 3.0*  --  3.1* 4.5  CL 100  --  103 104  CO2 23  --  21* 22  GLUCOSE 120*  --  160* 168*  BUN 15  --  15 15  CREATININE 0.79  --  0.68 0.68  CALCIUM 8.0*  --  7.4* 7.8*  MG  --  2.0  --   --     GFR: Estimated Creatinine Clearance: 40 mL/min (by C-G formula based on SCr of 0.68 mg/dL). Liver Function Tests: Recent Labs  Lab 03/17/21 0650  AST 27  ALT 33  ALKPHOS 49  BILITOT 0.8  PROT 7.1  ALBUMIN 3.1*    No results for input(s): LIPASE, AMYLASE in the last 168 hours. No results for input(s): AMMONIA in the last 168 hours. Coagulation Profile: No results for input(s): INR, PROTIME in the last 168 hours. Cardiac Enzymes: No results for input(s): CKTOTAL, CKMB, CKMBINDEX, TROPONINI in the last 168 hours. BNP (last 3 results) No results for input(s): PROBNP in the last 8760 hours. HbA1C: No results for input(s): HGBA1C in the last 72 hours. CBG: No results for input(s): GLUCAP in the last 168 hours. Lipid Profile: No results for input(s):  CHOL, HDL, LDLCALC, TRIG, CHOLHDL, LDLDIRECT in the last 72 hours. Thyroid Function Tests: No results for input(s): TSH, T4TOTAL, FREET4, T3FREE, THYROIDAB in the last 72 hours. Anemia Panel: No results for input(s): VITAMINB12, FOLATE, FERRITIN, TIBC, IRON, RETICCTPCT in the last 72 hours. Sepsis Labs: Recent Labs  Lab 03/17/21 0712 03/17/21 1621 03/19/21 0104  PROCALCITON  --  1.90 1.44  LATICACIDVEN 1.5  --   --      Recent Results (from the past 240 hour(s))  Resp Panel by RT-PCR (Flu  A&B, Covid) Nasopharyngeal Swab     Status: None   Collection Time: 03/17/21  7:00 AM   Specimen: Nasopharyngeal Swab; Nasopharyngeal(NP) swabs in vial transport medium  Result Value Ref Range Status   SARS Coronavirus 2 by RT PCR NEGATIVE NEGATIVE Final    Comment: (NOTE) SARS-CoV-2 target nucleic acids are NOT DETECTED.  The SARS-CoV-2 RNA is generally detectable in upper respiratory specimens during the acute phase of infection. The lowest concentration of SARS-CoV-2 viral copies this assay can detect is 138 copies/mL. A negative result does not preclude SARS-Cov-2 infection and should not be used as the sole basis for treatment or other patient management decisions. A negative result may occur with  improper specimen collection/handling, submission of specimen other than nasopharyngeal swab, presence of viral mutation(s) within the areas targeted by this assay, and inadequate number of viral copies(<138 copies/mL). A negative result must be combined with clinical observations, patient history, and epidemiological information. The expected result is Negative.  Fact Sheet for Patients:  EntrepreneurPulse.com.au  Fact Sheet for Healthcare Providers:  IncredibleEmployment.be  This test is no t yet approved or cleared by the [place] FDA and  has been authorized for detection and/or diagnosis of SARS-CoV-2 by FDA under an Emergency Use  Authorization (EUA). This EUA will remain  in effect (meaning this test can be used) for the duration of the COVID-19 declaration under Section 564(b)(1) of the Act, 21 U.S.C.section 360bbb-3(b)(1), unless the authorization is terminated  or revoked sooner.       Influenza A by PCR NEGATIVE NEGATIVE Final   Influenza B by PCR NEGATIVE NEGATIVE Final    Comment: (NOTE) The Xpert Xpress SARS-CoV-2/FLU/RSV plus assay is intended as an aid in the diagnosis of influenza from Nasopharyngeal swab specimens and should not be used as a sole basis for treatment. Nasal washings and aspirates are unacceptable for Xpert Xpress SARS-CoV-2/FLU/RSV testing.  Fact Sheet for Patients: EntrepreneurPulse.com.au  Fact Sheet for Healthcare Providers: IncredibleEmployment.be  This test is not yet approved or cleared by the [place] FDA and has been authorized for detection and/or diagnosis of SARS-CoV-2 by FDA under an Emergency Use Authorization (EUA). This EUA will remain in effect (meaning this test can be used) for the duration of the COVID-19 declaration under Section 564(b)(1) of the Act, 21 U.S.C. section 360bbb-3(b)(1), unless the authorization is terminated or revoked.  Performed at [facility], [place] [address] [place], [place] [address]   MRSA Next Gen by PCR, Nasal     Status: None   Collection Time: 03/17/21  4:21 PM   Specimen: Nasal Mucosa; Nasal Swab  Result Value Ref Range Status   MRSA by PCR Next Gen NOT DETECTED NOT DETECTED Final    Comment: (NOTE) The GeneXpert MRSA Assay (FDA approved for NASAL specimens only), is one component of a comprehensive MRSA colonization surveillance program. It is not intended to diagnose MRSA infection nor to guide or monitor treatment for MRSA infections. Test performance is not FDA approved in patients less than 44 years old. Performed at [facility], [place] [address], [place],  [place] [address]        Radiology Studies: No results found.    Scheduled Meds:  amLODipine  5 mg Oral q morning   aspirin EC  81 mg Oral Daily   azithromycin  500 mg Oral Daily   benazepril  20 mg Oral q morning   docusate sodium  100 mg Oral BID   enoxaparin (LOVENOX) injection  40 mg Subcutaneous Q24H   [START ON 03/20/2021] fentaNYL  1 patch Transdermal C62B   folic acid  1 mg Oral Daily   levothyroxine  125 mcg Oral QAC breakfast   methylPREDNISolone (SOLU-MEDROL) injection  60 mg Intravenous Q24H   pantoprazole  20 mg Oral Daily   sodium chloride flush  3 mL Intravenous Q12H   Continuous Infusions:  cefTRIAXone (ROCEPHIN)  IV 2 g (03/19/21 0840)     LOS: 2 days     Dessa Phi, DO [facility] 03/19/2021, 11:48 AM   Available via Epic secure chat 7am-7pm After these hours, please refer to coverage provider listed on [email or web address]

## 2021-03-20 LAB — BASIC METABOLIC PANEL
Anion gap: 10 (ref 5–15)
BUN: 12 mg/dL (ref 8–23)
CO2: 24 mmol/L (ref 22–32)
Calcium: 8 mg/dL — ABNORMAL LOW (ref 8.9–10.3)
Chloride: 104 mmol/L (ref 98–111)
Creatinine, Ser: 0.62 mg/dL (ref 0.44–1.00)
GFR, Estimated: >60 mL/min (ref >60)
Glucose, Bld: 150 mg/dL — ABNORMAL HIGH (ref 70–99)
Potassium: 3.5 mmol/L (ref 3.5–5.1)
Sodium: 138 mmol/L (ref 135–145)

## 2021-03-20 LAB — CBC
HCT: 29 % — ABNORMAL LOW (ref 36.0–46.0)
HCT: 32.1 % — ABNORMAL LOW (ref 36.0–46.0)
Hemoglobin: 9.3 g/dL — ABNORMAL LOW (ref 12.0–15.0)
Hemoglobin: 10.5 g/dL — ABNORMAL LOW (ref 12.0–15.0)
MCH: 29.3 pg (ref 26.0–34.0)
MCH: 29.6 pg (ref 26.0–34.0)
MCHC: 32.1 g/dL (ref 30.0–36.0)
MCHC: 32.7 g/dL (ref 30.0–36.0)
MCV: 91.5 fL (ref 80.0–100.0)
MCV: 90.4 fL (ref 80.0–100.0)
Platelets: 352 10*3/uL (ref 150–400)
Platelets: 423 10*3/uL — ABNORMAL HIGH (ref 150–400)
RBC: 3.17 MIL/uL — ABNORMAL LOW (ref 3.87–5.11)
RBC: 3.55 MIL/uL — ABNORMAL LOW (ref 3.87–5.11)
RDW: 19.4 % — ABNORMAL HIGH (ref 11.5–15.5)
RDW: 19.6 % — ABNORMAL HIGH (ref 11.5–15.5)
WBC: 11 10*3/uL — ABNORMAL HIGH (ref 4.0–10.5)
WBC: 16.7 10*3/uL — ABNORMAL HIGH (ref 4.0–10.5)
nRBC: 0 % (ref 0.0–0.2)
nRBC: 0.1 % (ref 0.0–0.2)

## 2021-03-20 LAB — EXPECTORATED SPUTUM ASSESSMENT W GRAM STAIN, RFLX TO RESP C

## 2021-03-20 LAB — PROCALCITONIN: Procalcitonin: 1.09 ng/mL

## 2021-03-20 MED ORDER — PREDNISONE 20 MG PO TABS
40.0000 mg | ORAL_TABLET | Freq: Every day | ORAL | Status: DC
  Administered 2021-03-21: 40 mg via ORAL
  Filled 2021-03-20: qty 2

## 2021-03-20 MED ORDER — OXYCODONE HCL 5 MG PO TABS
5.0000 mg | ORAL_TABLET | Freq: Once | ORAL | Status: AC
  Administered 2021-03-20: 5 mg via ORAL
  Filled 2021-03-20: qty 1

## 2021-03-20 NOTE — Progress Notes (Addendum)
?PROGRESS NOTE ? ? ? ?[name]  IRJ:[id number] DOB: [birth year] DOA: 03/17/2021 ?PCP: [name], MD  ? ?  ?Brief Narrative:  ?Jocelyn Bauer is a 88 y.o. female with medical history significant of chronic pain; HTN; HLD; hypothyroidism; and recent (01/22/21) admission for infected R leg hardware s/p removal that was treated with linezolid and cipro (no longer taking as of mid-February) presenting with SOB, fatigue. She had been feeling better but this week has been too weak to stand and do her usual activities.  She collapsed to the floor once (without injury) and family had to help her up.  She has been coughing a lot, productive of yellowish sputum.  She doesn't think she has had fever.  She felt like she did a couple of years ago when she had PNA.  She does not wear home O2 and was feeling SOB.  CXR with multilobar PNA.  86% on RA, improved with 3L [place] O2.  Started Rocephin, Azithro. ? ?New events last 24 hours / Subjective: ?Patient sitting in bed. Reports modest improvement in cough and dyspnea. No chest pain. Tolerating diet. Feels weak ? ?Assessment & Plan: ?  ?Principal Problem: ?  Multifocal pneumonia ?Active Problems: ?  Hypertension ?  Hypothyroidism ?  Chronic, continuous use of opioids ?  Infected hardware in right leg ([place]) ?  RA (rheumatoid arthritis) ([place]) ?  DNR (do not resuscitate) ? ? ?Multifocal pneumonia ?CXR: The appearance of the chest is concerning for severe multilobar bronchopneumonia in the left lung, most evident at the left lung base. Covid/flu neg. No blood cultures ordered. Strep urine antigen neg. Treating with ceftriaxone, azithromycin, and solumedrol. Slowly improving. Continues to require O2. Remains very congested. Procal elevated. Recent broad spectrum abx but does appear to be improving on ceftriaxone/azithromycin so will continue that for now ?- sputum for culture if able to provide ?- continue ceftriaxone, azithromycin. Transition solumedrol to oral prednisone ?- continue  mucolytic, IS, and add flutter valve ?-Continue Rocephin, azithromycin, solumedrol  ?- has SNF bed. Has not made adequate progress for transition to PO abx today, hopefull for possible d/c tomorrow ? ?Acute hypoxemic respiratory failure ?Secondary to above, currently requiring 2 L nasal cannula O2, down from 4 L yesterday. ?- Wean to room air as tolerated  ? ?Rheumatoid arthritis ?-Home medications on hold. Prednisone as above ? ?Recent admission for infected hardware in right leg ?-Completed treatment in February ? ?Hypothyroidism ?-Continue Synthroid ? ?Hypertension ?-Continue Norvasc, lotensin  ? ?Demand ischemia ?-Without concern for ACS, 97 >> 112 >> 90 >> 104  ? ? ?DVT prophylaxis:  ?enoxaparin (LOVENOX) injection 40 mg Start: 03/17/21 1311 ? ?Code Status: DNR ?Family Communication: daughter updated telephonically 3/14 ?Disposition Plan:  ?Status is: Inpatient ?Remains inpatient appropriate because: Remains on nasal cannula O2, wean to room air as tolerated.  Remains on IV antibiotics.  Has snf bed ? ? ?Antimicrobials:  ?Anti-infectives (From admission, onward)  ? ? Start     Dose/Rate Route Frequency Ordered Stop  ? 03/19/21 1000  azithromycin (ZITHROMAX) tablet 500 mg       ? 500 mg Oral Daily 03/18/21 1329 03/22/21 0959  ? 03/18/21 0800  cefTRIAXone (ROCEPHIN) 2 g in sodium chloride 0.9 % 100 mL IVPB       ? 2 g ?200 mL/hr over 30 Minutes Intravenous Every 24 hours 03/17/21 1139 03/22/21 0759  ? 03/18/21 0800  azithromycin (ZITHROMAX) 500 mg in sodium chloride 0.9 % 250 mL IVPB  Status:  Discontinued       ?  500 mg ?250 mL/hr over 60 Minutes Intravenous Every 24 hours 03/17/21 1139 03/18/21 1328  ? 03/17/21 0945  cefTRIAXone (ROCEPHIN) 1 g in sodium chloride 0.9 % 100 mL IVPB       ? 1 g ?200 mL/hr over 30 Minutes Intravenous  Once 03/17/21 0934 03/17/21 1208  ? 03/17/21 0945  azithromycin (ZITHROMAX) tablet 500 mg       ? 500 mg Oral  Once 03/17/21 0934 03/17/21 1032  ? ?  ? ? ? ?Objective: ?Vitals:  ?  03/20/21 0015 03/20/21 0400 03/20/21 0842 03/20/21 0850  ?BP: 134/60 (!) 141/70  (!) 153/62  ?Pulse: 93 78  99  ?Resp: 15 (!) 24  18  ?Temp: (!) 96.9 ?F (36.1 ?C) 98.1 ?F (36.7 ?C)  (!) 97.3 ?F (36.3 ?C)  ?TempSrc: Axillary Oral  Axillary  ?SpO2: 97% 96% 91% 94%  ?Weight:      ?Height:      ? ? ?Intake/Output Summary (Last 24 hours) at 03/20/2021 1107 ?Last data filed at 03/20/2021 0420 ?Gross per 24 hour  ?Intake 240 ml  ?Output 550 ml  ?Net -310 ml  ? ?Filed Weights  ? 03/17/21 0647 03/17/21 2000  ?Weight: 62.1 kg 63 kg  ? ? ?Examination:  ?General exam: Appears calm and comfortable  ?Respiratory system: coarse rhonchi throughout ?Cardiovascular system: S1 & S2 heard. No murmurs. No pedal edema. ?Gastrointestinal system: Abdomen is nondistended, soft and nontender. Normal bowel sounds heard. ?Central nervous system: Alert and oriented.  ?Extremities: Symmetric in appearance  ?Skin: No rashes, lesions or ulcers on exposed skin  ?Psychiatry: Judgement and insight appear normal.  ? ?Data Reviewed: I have personally reviewed following labs and imaging studies ? ?CBC: ?Recent Labs  ?Lab 03/17/21 ?0650 03/18/21 ?0004 03/20/21 ?0123  ?WBC 5.5 6.1 11.0*  ?NEUTROABS 4.9  --   --   ?HGB 9.7* 8.8* 9.3*  ?HCT 30.8* 27.7* 29.0*  ?MCV 93.1 91.7 91.5  ?PLT 298 284 352  ? ?Basic Metabolic Panel: ?Recent Labs  ?Lab 03/17/21 ?0650 03/17/21 ?2536 03/18/21 ?0004 03/19/21 ?0104 03/20/21 ?0123  ?NA 134*  --  134* 136 138  ?K 3.0*  --  3.1* 4.5 3.5  ?CL 100  --  103 104 104  ?CO2 23  --  21* 22 24  ?GLUCOSE 120*  --  160* 168* 150*  ?BUN 15  --  '15 15 12  '$ ?CREATININE 0.79  --  0.68 0.68 0.62  ?CALCIUM 8.0*  --  7.4* 7.8* 8.0*  ?MG  --  2.0  --   --   --   ? ?GFR: ?Estimated Creatinine Clearance: 40 mL/min (by C-G formula based on SCr of 0.62 mg/dL). ?Liver Function Tests: ?Recent Labs  ?Lab 03/17/21 ?6440  ?AST 27  ?ALT 33  ?ALKPHOS 49  ?BILITOT 0.8  ?PROT 7.1  ?ALBUMIN 3.1*  ? ?No results for input(s): LIPASE, AMYLASE in the last 168  hours. ?No results for input(s): AMMONIA in the last 168 hours. ?Coagulation Profile: ?No results for input(s): INR, PROTIME in the last 168 hours. ?Cardiac Enzymes: ?No results for input(s): CKTOTAL, CKMB, CKMBINDEX, TROPONINI in the last 168 hours. ?BNP (last 3 results) ?No results for input(s): PROBNP in the last 8760 hours. ?HbA1C: ?No results for input(s): HGBA1C in the last 72 hours. ?CBG: ?No results for input(s): GLUCAP in the last 168 hours. ?Lipid Profile: ?No results for input(s): CHOL, HDL, LDLCALC, TRIG, CHOLHDL, LDLDIRECT in the last 72 hours. ?Thyroid Function Tests: ?No results for input(s): TSH,  T4TOTAL, FREET4, T3FREE, THYROIDAB in the last 72 hours. ?Anemia Panel: ?No results for input(s): VITAMINB12, FOLATE, FERRITIN, TIBC, IRON, RETICCTPCT in the last 72 hours. ?Sepsis Labs: ?Recent Labs  ?Lab 03/17/21 ?0712 03/17/21 ?1621 03/19/21 ?0104 03/20/21 ?0123  ?PROCALCITON  --  1.90 1.44 1.09  ?LATICACIDVEN 1.5  --   --   --   ? ? ?Recent Results (from the past 240 hour(s))  ?Resp Panel by RT-PCR (Flu A&B, Covid) Nasopharyngeal Swab     Status: None  ? Collection Time: 03/17/21  7:00 AM  ? Specimen: Nasopharyngeal Swab; Nasopharyngeal(NP) swabs in vial transport medium  ?Result Value Ref Range Status  ? SARS Coronavirus 2 by RT PCR NEGATIVE NEGATIVE Final  ?  Comment: (NOTE) ?SARS-CoV-2 target nucleic acids are NOT DETECTED. ? ?The SARS-CoV-2 RNA is generally detectable in upper respiratory ?specimens during the acute phase of infection. The lowest ?concentration of SARS-CoV-2 viral copies this assay can detect is ?138 copies/mL. A negative result does not preclude SARS-Cov-2 ?infection and should not be used as the sole basis for treatment or ?other patient management decisions. A negative result may occur with  ?improper specimen collection/handling, submission of specimen other ?than nasopharyngeal swab, presence of viral mutation(s) within the ?areas targeted by this assay, and inadequate number of  viral ?copies(<138 copies/mL). A negative result must be combined with ?clinical observations, patient history, and epidemiological ?information. The expected result is Negative. ? ?Fact Sheet for Patient

## 2021-03-20 NOTE — Care Management Important Message (Signed)
Important Message ? ?Patient Details  ?Name: Jocelyn Bauer ?MRN: [id number] ?Date of Birth: 1928/07/25 ? ? ?Medicare Important Message Given:  Yes ? ? ? ? ?Zyon Grout ?03/20/2021, 2:28 PM ?

## 2021-03-20 NOTE — Progress Notes (Signed)
Occupational Therapy Treatment ?Patient Details ?Name: Jocelyn Bauer ?MRN: [id number] ?DOB: Mar 31, 1928 ?Today's Date: 03/20/2021 ? ? ?History of present illness Patient is a 88 y/o female who presents on 03/17/21 with SOB, fatigue, weakness and recent fall. CXR-multilobal PNA. Recent admission 1/16 for infected RLE hardware. PMH includes HTN, OA, chronic pain, bil total elbow arthroplasties, Bil THAs. ?  ?OT comments ? Pt returning to bed from [facility] with NT at start of session, pt agreeable to grooming task sitting EOB with set up A. Pt min A for bed mobility, mod A for transfers. Pt Spo2 dropping to mid 80's on RA with seated ADL, poor wave form, recovered into 90's once supine. Pt able to stand and take lateral steps at EOB, however pt fatiguing, tachycardic with HR mid 120's, further mobility deferred. Pt presenting with impairments listed below, will follow acutely. Continue to recommend SNF at d/c.  ? ?Recommendations for follow up therapy are one component of a multi-disciplinary discharge planning process, led by the attending physician.  Recommendations may be updated based on patient status, additional functional criteria and insurance authorization. ?   ?Follow Up Recommendations ? Skilled nursing-short term rehab (<3 hours/day)  ?  ?Assistance Recommended at Discharge Intermittent Supervision/Assistance  ?Patient can return home with the following ? A lot of help with walking and/or transfers;A lot of help with bathing/dressing/bathroom;Assistance with cooking/housework;Assist for transportation;Help with stairs or ramp for entrance ?  ?Equipment Recommendations ? None recommended by OT;Other (comment) (defer to next venue of care)  ?  ?Recommendations for Other Services   ? ?  ?Precautions / Restrictions Precautions ?Precautions: Fall ?Precaution Comments: watch 02 and HR ?Restrictions ?Weight Bearing Restrictions: No  ? ? ?  ? ?Mobility Bed Mobility ?Overal bed mobility: Needs Assistance ?Bed Mobility:  Supine to Sit, Sit to Supine ?Rolling: Min assist ?Sidelying to sit: Min assist ?  ?  ?  ?General bed mobility comments: min A for trunk elevation ?  ? ?Transfers ?Overall transfer level: Needs assistance ?Equipment used: 1 person hand held assist ?Transfers: Sit to/from Stand ?Sit to Stand: Mod assist ?  ?  ?  ?  ?  ?General transfer comment: side stepping toward [facility] ?  ?  ?Balance Overall balance assessment: Needs assistance, History of Falls ?Sitting-balance support: Feet supported ?Sitting balance-Leahy Scale: Good ?Sitting balance - Comments: able to reach outside BOS without LOB ?  ?Standing balance support: Bilateral upper extremity supported, During functional activity, Reliant on assistive device for balance ?Standing balance-Leahy Scale: Poor ?  ?  ?  ?  ?  ?  ?  ?  ?  ?  ?  ?  ?   ? ?ADL either performed or assessed with clinical judgement  ? ?ADL Overall ADL's : Needs assistance/impaired ?  ?  ?Grooming: Set up;Sitting;Oral care;Wash/dry face ?Grooming Details (indicate cue type and reason): completed sitting EOB ?  ?  ?  ?  ?  ?  ?  ?  ?Toilet Transfer: Moderate assistance ?Toilet Transfer Details (indicate cue type and reason): simulated lateral stepping at EOB ?  ?  ?  ?  ?Functional mobility during ADLs: Minimal assistance;Moderate assistance ?  ?  ? ?Extremity/Trunk Assessment Upper Extremity Assessment ?Upper Extremity Assessment: Generalized weakness ?RUE Deficits / Details: decreased ROM from prior elbow surgery, grip strength WFL ?LUE Deficits / Details: decreased ROM from prior elbow surgery, grip strength WFL ?  ?Lower Extremity Assessment ?Lower Extremity Assessment: Defer to PT evaluation ?  ?  ?  ? ?  Vision   ?Vision Assessment?: No apparent visual deficits ?  ?Perception Perception ?Perception: Not tested ?  ?Praxis Praxis ?Praxis: Not tested ?  ? ?Cognition Arousal/Alertness: Awake/alert ?Behavior During Therapy: [facility] for tasks assessed/performed ?Overall Cognitive Status: Within Functional  Limits for tasks assessed ?  ?  ?  ?  ?  ?  ?  ?  ?  ?  ?  ?  ?  ?  ?  ?  ?  ?  ?  ?   ?Exercises   ? ?  ?Shoulder Instructions   ? ? ?  ?General Comments SpO2 in mid 80's with sitting EOB on RA, poor wave form, in 90's once supine and O2 reapplied  ? ? ?Pertinent Vitals/ Pain       Pain Assessment ?Pain Assessment: Faces ?Pain Score: 4  ?Faces Pain Scale: Hurts little more ?Pain Location: B Ues ?Pain Descriptors / Indicators: Discomfort, Sore ?Pain Intervention(s): Limited activity within patient's tolerance, Monitored during session, Repositioned ? ?Home Living   ?  ?  ?  ?  ?  ?  ?  ?  ?  ?  ?  ?  ?  ?  ?  ?  ?  ?  ? ?  ?Prior Functioning/Environment    ?  ?  ?  ?   ? ?Frequency ? Min 2X/week  ? ? ? ? ?  ?Progress Toward Goals ? ?OT Goals(current goals can now be found in the care plan section) ? Progress towards OT goals: Progressing toward goals ? ?Acute Rehab OT Goals ?Patient Stated Goal: none stated ?OT Goal Formulation: With patient ?Time For Goal Achievement: 04/01/21 ?Potential to Achieve Goals: Fair ?ADL Goals ?Pt Will Perform Upper Body Dressing: with min assist;sitting ?Pt Will Perform Lower Body Dressing: with min assist;sit to/from stand;sitting/lateral leans ?Pt Will Transfer to Toilet: ambulating;bedside commode;stand pivot transfer;with min guard assist ?Pt Will Perform Tub/Shower Transfer: Shower transfer;Tub transfer;with min guard assist;rolling walker;ambulating;shower seat  ?Plan Discharge plan remains appropriate;Frequency remains appropriate   ? ?Co-evaluation ? ? ?   ?  ?  ?  ?  ? ?  ?AM-PAC OT "6 Clicks" Daily Activity     ?Outcome Measure ? ? Help from another person eating meals?: None ?Help from another person taking care of personal grooming?: A Little ?Help from another person toileting, which includes using toliet, bedpan, or urinal?: A Lot ?Help from another person bathing (including washing, rinsing, drying)?: A Lot ?Help from another person to put on and taking off regular upper  body clothing?: A Lot ?Help from another person to put on and taking off regular lower body clothing?: Total ?6 Click Score: 14 ? ?  ?End of Session Equipment Utilized During Treatment: Oxygen ? ?OT Visit Diagnosis: Unsteadiness on feet (R26.81);Other abnormalities of gait and mobility (R26.89);Muscle weakness (generalized) (M62.81);History of falling (Z91.81) ?  ?Activity Tolerance Patient tolerated treatment well ?  ?Patient Left in bed;with call bell/phone within reach;with bed alarm set ?  ?Nurse Communication Mobility status ?  ? ?   ? ?Time: 7616-0737 ?OT Time Calculation (min): 23 min ? ?Charges: OT General Charges ?$OT Visit: 1 Visit ?OT Treatments ?$Self Care/Home Management : 8-22 mins ?$Therapeutic Activity: 8-22 mins ? ?Lynnda Child, OTD, OTR/L ?Acute Rehab ?[phone] ? ? ?Kaylyn Lim ?03/20/2021, 10:03 AM ?

## 2021-03-20 NOTE — Plan of Care (Signed)
  Problem: Activity: Goal: Ability to tolerate increased activity will improve Outcome: Progressing   Problem: Clinical Measurements: Goal: Ability to maintain a body temperature in the normal range will improve Outcome: Progressing   Problem: Respiratory: Goal: Ability to maintain adequate ventilation will improve Outcome: Progressing Goal: Ability to maintain a clear airway will improve Outcome: Progressing   

## 2021-03-21 DIAGNOSIS — R531 Weakness: Secondary | ICD-10-CM | POA: Diagnosis not present

## 2021-03-21 DIAGNOSIS — R601 Generalized edema: Secondary | ICD-10-CM | POA: Diagnosis not present

## 2021-03-21 DIAGNOSIS — H04129 Dry eye syndrome of unspecified lacrimal gland: Secondary | ICD-10-CM | POA: Diagnosis not present

## 2021-03-21 DIAGNOSIS — E785 Hyperlipidemia, unspecified: Secondary | ICD-10-CM | POA: Diagnosis not present

## 2021-03-21 DIAGNOSIS — M810 Age-related osteoporosis without current pathological fracture: Secondary | ICD-10-CM | POA: Diagnosis not present

## 2021-03-21 DIAGNOSIS — Z9181 History of falling: Secondary | ICD-10-CM | POA: Diagnosis not present

## 2021-03-21 DIAGNOSIS — R0902 Hypoxemia: Secondary | ICD-10-CM | POA: Diagnosis not present

## 2021-03-21 DIAGNOSIS — F419 Anxiety disorder, unspecified: Secondary | ICD-10-CM | POA: Diagnosis not present

## 2021-03-21 DIAGNOSIS — F1190 Opioid use, unspecified, uncomplicated: Secondary | ICD-10-CM | POA: Diagnosis not present

## 2021-03-21 DIAGNOSIS — M6281 Muscle weakness (generalized): Secondary | ICD-10-CM | POA: Diagnosis not present

## 2021-03-21 DIAGNOSIS — J189 Pneumonia, unspecified organism: Secondary | ICD-10-CM | POA: Diagnosis not present

## 2021-03-21 DIAGNOSIS — R32 Unspecified urinary incontinence: Secondary | ICD-10-CM | POA: Diagnosis not present

## 2021-03-21 DIAGNOSIS — R5381 Other malaise: Secondary | ICD-10-CM | POA: Diagnosis not present

## 2021-03-21 DIAGNOSIS — K219 Gastro-esophageal reflux disease without esophagitis: Secondary | ICD-10-CM | POA: Diagnosis not present

## 2021-03-21 DIAGNOSIS — E039 Hypothyroidism, unspecified: Secondary | ICD-10-CM | POA: Diagnosis not present

## 2021-03-21 DIAGNOSIS — Z741 Need for assistance with personal care: Secondary | ICD-10-CM | POA: Diagnosis not present

## 2021-03-21 DIAGNOSIS — E559 Vitamin D deficiency, unspecified: Secondary | ICD-10-CM | POA: Diagnosis not present

## 2021-03-21 DIAGNOSIS — I10 Essential (primary) hypertension: Secondary | ICD-10-CM | POA: Diagnosis not present

## 2021-03-21 DIAGNOSIS — Z79891 Long term (current) use of opiate analgesic: Secondary | ICD-10-CM | POA: Diagnosis not present

## 2021-03-21 DIAGNOSIS — F32A Depression, unspecified: Secondary | ICD-10-CM | POA: Diagnosis not present

## 2021-03-21 DIAGNOSIS — R1310 Dysphagia, unspecified: Secondary | ICD-10-CM | POA: Diagnosis not present

## 2021-03-21 DIAGNOSIS — E876 Hypokalemia: Secondary | ICD-10-CM | POA: Diagnosis not present

## 2021-03-21 DIAGNOSIS — Z96621 Presence of right artificial elbow joint: Secondary | ICD-10-CM | POA: Diagnosis not present

## 2021-03-21 DIAGNOSIS — D649 Anemia, unspecified: Secondary | ICD-10-CM | POA: Diagnosis not present

## 2021-03-21 DIAGNOSIS — Z7401 Bed confinement status: Secondary | ICD-10-CM | POA: Diagnosis not present

## 2021-03-21 DIAGNOSIS — E538 Deficiency of other specified B group vitamins: Secondary | ICD-10-CM | POA: Diagnosis not present

## 2021-03-21 DIAGNOSIS — R600 Localized edema: Secondary | ICD-10-CM | POA: Diagnosis not present

## 2021-03-21 DIAGNOSIS — M542 Cervicalgia: Secondary | ICD-10-CM | POA: Diagnosis not present

## 2021-03-21 DIAGNOSIS — M1990 Unspecified osteoarthritis, unspecified site: Secondary | ICD-10-CM | POA: Diagnosis not present

## 2021-03-21 DIAGNOSIS — R2681 Unsteadiness on feet: Secondary | ICD-10-CM | POA: Diagnosis not present

## 2021-03-21 DIAGNOSIS — R059 Cough, unspecified: Secondary | ICD-10-CM | POA: Diagnosis not present

## 2021-03-21 DIAGNOSIS — K5900 Constipation, unspecified: Secondary | ICD-10-CM | POA: Diagnosis not present

## 2021-03-21 DIAGNOSIS — R41841 Cognitive communication deficit: Secondary | ICD-10-CM | POA: Diagnosis not present

## 2021-03-21 DIAGNOSIS — Z96642 Presence of left artificial hip joint: Secondary | ICD-10-CM | POA: Diagnosis not present

## 2021-03-21 DIAGNOSIS — M069 Rheumatoid arthritis, unspecified: Secondary | ICD-10-CM | POA: Diagnosis not present

## 2021-03-21 DIAGNOSIS — G894 Chronic pain syndrome: Secondary | ICD-10-CM | POA: Diagnosis not present

## 2021-03-21 LAB — BASIC METABOLIC PANEL
Anion gap: 11 (ref 5–15)
BUN: 9 mg/dL (ref 8–23)
CO2: 24 mmol/L (ref 22–32)
Calcium: 7.5 mg/dL — ABNORMAL LOW (ref 8.9–10.3)
Chloride: 100 mmol/L (ref 98–111)
Creatinine, Ser: 0.6 mg/dL (ref 0.44–1.00)
GFR, Estimated: >60 mL/min (ref >60)
Glucose, Bld: 133 mg/dL — ABNORMAL HIGH (ref 70–99)
Potassium: 2.9 mmol/L — ABNORMAL LOW (ref 3.5–5.1)
Sodium: 135 mmol/L (ref 135–145)

## 2021-03-21 MED ORDER — ALBUTEROL SULFATE (2.5 MG/3ML) 0.083% IN NEBU: 2.5000 mg | INHALATION_SOLUTION | RESPIRATORY_TRACT | 12 refills | Status: AC | PRN

## 2021-03-21 MED ORDER — AMOXICILLIN-POT CLAVULANATE 875-125 MG PO TABS: 1.0000 | ORAL_TABLET | Freq: Two times a day (BID) | ORAL | 0 refills | Status: AC

## 2021-03-21 MED ORDER — AMLODIPINE BESYLATE 10 MG PO TABS: 10.0000 mg | ORAL_TABLET | Freq: Every morning | ORAL | Status: DC

## 2021-03-21 MED ORDER — GUAIFENESIN ER 600 MG PO TB12: 600.0000 mg | ORAL_TABLET | Freq: Two times a day (BID) | ORAL | Status: AC | PRN

## 2021-03-21 MED ORDER — PREDNISONE 5 MG PO TABS: ORAL_TABLET | ORAL | 0 refills | Status: DC

## 2021-03-21 MED ORDER — POLYETHYLENE GLYCOL 3350 17 G PO PACK: 17.0000 g | PACK | Freq: Every day | ORAL | 0 refills | Status: AC | PRN

## 2021-03-21 MED ORDER — ASPIRIN EC 81 MG PO TBEC: 81.0000 mg | DELAYED_RELEASE_TABLET | Freq: Every day | ORAL | Status: AC

## 2021-03-21 MED ORDER — POTASSIUM CHLORIDE CRYS ER 20 MEQ PO TBCR
40.0000 meq | EXTENDED_RELEASE_TABLET | ORAL | Status: AC
  Administered 2021-03-21 (×2): 40 meq via ORAL
  Filled 2021-03-21 (×2): qty 2

## 2021-03-21 MED ORDER — OXYCODONE-ACETAMINOPHEN 10-325 MG PO TABS: 1.0000 | ORAL_TABLET | Freq: Four times a day (QID) | ORAL | 0 refills | Status: DC | PRN

## 2021-03-21 MED ORDER — METHOTREXATE SODIUM CHEMO INJECTION 50 MG/2ML: 22.5000 mg | INTRAMUSCULAR | Status: DC

## 2021-03-21 MED ORDER — FENTANYL 50 MCG/HR TD PT72: 1.0000 | MEDICATED_PATCH | TRANSDERMAL | 0 refills | Status: DC

## 2021-03-21 NOTE — TOC Transition Note (Addendum)
Transition of Care (TOC) - CM/SW Discharge Note ? ? ?Patient Details  ?Name: Jocelyn Bauer ?MRN: [id number] ?Date of Birth: 06-Mar-1928 ? ?Transition of Care (TOC) CM/SW Contact:  ?Benard Halsted, LCSW ?Phone Number: ?03/21/2021, 9:49 AM ? ? ?Clinical Narrative:    ?Patient will DC to: [facility] ?Anticipated DC date: 03/21/21 ?Family notified: Son, Jeanell Sparrow ?Transport by: Ocie Cornfield 1pm ? ? ?Per MD patient ready for DC to [facility]. RN to call report prior to discharge [phone]). RN, patient, patient's family, and facility notified of DC. Discharge Summary and FL2 sent to facility. DC packet on chart. Ambulance transport requested for patient.  ? ?CSW will sign off for now as social work intervention is no longer needed. Please consult [place] again if new needs arise. ? ? ? ? ?Final next level of care: [place] ?Barriers to Discharge: Barriers Resolved ? ? ?Patient Goals and CMS Choice ?Patient states their goals for this hospitalization and ongoing recovery are:: Rehab ?CMS Medicare.gov Compare Post Acute Care list provided to:: Patient ?Choice offered to / list presented to : Patient ? ?Discharge Placement ?  ?Existing PASRR number confirmed : 03/21/21          ?Patient [facility] bed at: [facility] ?Patient to be transferred to facility by: [facility] ?Name of family member notified: Daughter ?Patient and family notified of of transfer: 03/21/21 ? ?Discharge Plan and Services ?In-house Referral: Clinical Social Work ?  ?Post Acute Care Choice: [place]          ?  ?  ?  ?  ?  ?  ?  ?  ?  ?  ? ?Social Determinants of Health (SDOH) Interventions ?  ? ? ?Readmission Risk Interventions ?No flowsheet data found. ? ? ? ? ?

## 2021-03-21 NOTE — Plan of Care (Signed)
  Problem: Activity: Goal: Ability to tolerate increased activity will improve Outcome: Progressing   Problem: Clinical Measurements: Goal: Ability to maintain a body temperature in the normal range will improve Outcome: Progressing   Problem: Respiratory: Goal: Ability to maintain adequate ventilation will improve Outcome: Progressing Goal: Ability to maintain a clear airway will improve Outcome: Progressing   

## 2021-03-21 NOTE — Progress Notes (Signed)
Physical Therapy Treatment ?Patient Details ?Name: Jocelyn Bauer ?MRN: [id number] ?DOB: 1928-03-21 ?Today's Date: 03/21/2021 ? ? ?History of Present Illness Patient is a 88 y/o female who presents on 03/17/21 with SOB, fatigue, weakness and recent fall. CXR-multilobal PNA. Recent admission 1/16 for infected RLE hardware. PMH includes HTN, OA, chronic pain, bil total elbow arthroplasties, Bil THAs. ? ?  ?PT Comments  ? ? Pt admitted with above diagnosis. Pt was able to ambulate with RW to hallway with min assiset and chair follow. Pooor postural stabiltiy with RW with pt needing min assist and chair follow. Pt desaturates on RA with activity as well needing 3LO2.  Will continue PT.  Pt currently with functional limitations due to balance and endurance deficits. Pt will benefit from skilled PT to increase their independence and safety with mobility to allow discharge to the venue listed below.      ?Recommendations for follow up therapy are one component of a multi-disciplinary discharge planning process, led by the attending physician.  Recommendations may be updated based on patient status, additional functional criteria and insurance authorization. ? ?Follow Up Recommendations ? Skilled nursing-short term rehab (<3 hours/day) ?  ?  ?Assistance Recommended at Discharge Intermittent Supervision/Assistance  ?Patient can return home with the following Help with stairs or ramp for entrance;Assist for transportation;Assistance with cooking/housework;A little help with bathing/dressing/bathroom;A lot of help with walking and/or transfers ?  ?Equipment Recommendations ? None recommended by PT  ?  ?Recommendations for Other Services   ? ? ?  ?Precautions / Restrictions Precautions ?Precautions: Fall ?Precaution Comments: watch 02 and HR ?Restrictions ?Weight Bearing Restrictions: No  ?  ? ?Mobility ? Bed Mobility ?  ?  ?  ?  ?  ?  ?  ?General bed mobility comments: In chair on arrival and reports needing to use bathroom.  Placed 3N1 beside pt. ?  ? ?Transfers ?Overall transfer level: Needs assistance ?Equipment used: Rolling walker (2 wheels) ?Transfers: Sit to/from Stand ?Sit to Stand: Mod assist ?Stand pivot transfers: Min assist, +2 physical assistance ?  ?  ?  ?  ?General transfer comment: Stood and pivoted to 3N1 with RW. Pt with flexed posture and needed cues and assist for walker safety and for pivot. Placed brief on pt to avoid incontinence with mobility. ?  ? ?Ambulation/Gait ?Ambulation/Gait assistance: Min assist, +2 safety/equipment ?Gait Distance (Feet): 45 Feet ?Assistive device: Rolling walker (2 wheels) ?Gait Pattern/deviations: Decreased step length - right, Decreased step length - left, Trunk flexed, Decreased stride length ?Gait velocity: decreased ?Gait velocity interpretation: <1.31 ft/sec, indicative of household ambulator ?  ?General Gait Details: Pt needed assist to ambulate to hallway as well as use of RW. Cues for posture as pt flexes at trunk significantly and does not stand tall even with cues.  Fatigues quickly and needed chair follow. ? ? ?Stairs ?  ?  ?  ?  ?  ? ? ?Wheelchair Mobility ?  ? ?Modified Rankin (Stroke Patients Only) ?  ? ? ?  ?Balance   ?  ?  ?  ?  ?Standing balance support: Bilateral upper extremity supported, During functional activity, Reliant on assistive device for balance ?Standing balance-Leahy Scale: Poor ?Standing balance comment: relies on bil UEs support ?  ?  ?  ?  ?  ?  ?  ?  ?  ?  ?  ?  ? ?  ?Cognition Arousal/Alertness: Awake/alert ?Behavior During Therapy: [facility] for tasks assessed/performed ?Overall Cognitive Status: Within Functional Limits for tasks  assessed ?  ?  ?  ?  ?  ?  ?  ?  ?  ?  ?  ?  ?  ?  ?  ?  ?General Comments: anxious with mobility, afraid of falling despite reassurance ?  ?  ? ?  ?Exercises Other Exercises ?Other Exercises: B LE LAQ, AP, heel slides, hip abd/add x 10 reps each ? ?  ?General Comments General comments (skin integrity, edema, etc.): Needed 3LO2  with activity for sats >88% but was 92% on RA at rest. Other VSS ?  ?  ? ?Pertinent Vitals/Pain Pain Assessment ?Pain Assessment: No/denies pain  ? ? ?Home Living   ?  ?  ?  ?  ?  ?  ?  ?  ?  ?   ?  ?Prior Function    ?  ?  ?   ? ?PT Goals (current goals can now be found in the care plan section) Acute Rehab PT Goals ?Patient Stated Goal: to go to rehab, then home ?Progress towards PT goals: Progressing toward goals ? ?  ?Frequency ? ? ? Min 3X/week ? ? ? ?  ?PT Plan Current plan remains appropriate  ? ? ?Co-evaluation   ?  ?  ?  ?  ? ?  ?AM-PAC PT "6 Clicks" Mobility   ?Outcome Measure ? Help needed turning from your back to your side while in a flat bed without using bedrails?: A Little ?Help needed moving from lying on your back to sitting on the side of a flat bed without using bedrails?: A Little ?Help needed moving to and from a bed to a chair (including a wheelchair)?: A Lot ?Help needed standing up from a chair using your arms (e.g., wheelchair or bedside chair)?: A Little ?Help needed to walk in hospital room?: A Lot ?Help needed climbing 3-5 steps with a railing? : Total ?6 Click Score: 14 ? ?  ?End of Session Equipment Utilized During Treatment: Gait belt;Oxygen ?Activity Tolerance: Patient limited by fatigue ?Patient left: in chair;with chair alarm set;with call bell/phone within reach ?Nurse Communication: Mobility status ?PT Visit Diagnosis: Muscle weakness (generalized) (M62.81);Unsteadiness on feet (R26.81);Difficulty in walking, not elsewhere classified (R26.2) ?  ? ? ?Time: 8676-1950 ?PT Time Calculation (min) (ACUTE ONLY): 25 min ? ?Charges:  $Gait Training: 8-22 mins ?$Therapeutic Exercise: 8-22 mins          ?          ? ?Haji Delaine M,PT ?Acute Rehab Services ?[phone] ?[phone] (pager)  ? ? ?Kelissa Merlin F Hoang [place]ill ?03/21/2021, 1:31 PM ? ?

## 2021-03-21 NOTE — Discharge Summary (Signed)
PATIENT DETAILS Name: Jocelyn Bauer Age: 88 y.o. Sex: female Date of Birth: 1928-02-13 MRN: [id number]. Admitting Physician: Jonah Blue, MD [id number], Loraine Leriche, MD  Admit Date: 03/17/2021 Discharge date: 03/21/2021  Recommendations for Outpatient Follow-up:  Follow up with PCP in 1-2 weeks Please obtain CMP/CBC in one week Repeat two-view chest x-ray in 4 to 6 weeks to document resolution of PNA. Follow sputum cultures until final   Admitted From:  Home  Disposition: Home   Discharge Condition: good  CODE STATUS:   Code Status: DNR   Diet recommendation:  Diet Order             Diet - low sodium heart healthy           Diet regular Room service appropriate? Yes; Fluid consistency: Thin  Diet effective now                    Brief Summary: Acute hypoxic respiratory failure due to multifocal pneumonia: Clinically improved-treated with Rocephin/Zithromax-hypoxia is significantly better-on minimal amount of oxygen-mostly for comfort.  Some cough continues but patient reports clinical improvement.  Has mild leukocytosis but this is from steroids that were escalated during this hospital stay.  Sputum cultures are negative so far.  We will transition to oral antimicrobial therapy on discharge.  Hypokalemia: We will replete prior to discharge-please recheck electrolytes in 1 week.  History of RA: Given that she was on chronic prednisone therapy-she was given stress dose of steroids-on discharge she will resume her usual immunosuppressive regimen.  Chronic pain syndrome: Continue usual narcotic regimen.  PCP/outpatient physicians to consider gradual/slow taper.  Hypothyroidism: Continue Synthroid  HTN: Stable-continue Norvasc and Lotensin.  History of infected hardware in right leg: She has already completed a course of IV antimicrobial therapy-this appears stable.  Minimally elevated troponin: Likely demand ischemia-no clinical setting for  ACS.  BMI: Estimated body mass index is 25.4 kg/m as calculated from the following:   Height as of this encounter: 5\' 2"  (1.575 m).   Weight as of this encounter: 63 kg.     Discharge Diagnoses:  Principal Problem:   Multifocal pneumonia Active Problems:   Hypertension   Hypothyroidism   Chronic, continuous use of opioids   Infected hardware in right leg (HCC)   RA (rheumatoid arthritis) (HCC)   DNR (do not resuscitate)   Discharge Instructions:  Activity:  As tolerated with Full fall precautions use walker/cane & assistance as needed  Discharge Instructions     Call MD for:  difficulty breathing, headache or visual disturbances   Complete by: As directed    Diet - low sodium heart healthy   Complete by: As directed    Discharge instructions   Complete by: As directed    Follow with Primary MD  Rodrigo Ran, MD in 1-2 weeks  Ask your primary care practitioner to repeat two-view chest x-ray in 4 to 6 weeks.  Please get a complete blood count and chemistry panel checked by your Primary MD at your next visit, and again as instructed by your Primary MD.  Get Medicines reviewed and adjusted: Please take all your medications with you for your next visit with your Primary MD  Laboratory/radiological data: Please request your Primary MD to go over all hospital tests and procedure/radiological results at the follow up, please ask your Primary MD to get all Hospital records sent to his/her office.  In some cases, they will be blood work, cultures and biopsy results pending at the  time of your discharge. Please request that your primary care M.D. follows up on these results.  Also Note the following: If you experience worsening of your admission symptoms, develop shortness of breath, life threatening emergency, suicidal or homicidal thoughts you must seek medical attention immediately by calling 911 or calling your MD immediately  if symptoms less severe.  You must read  complete instructions/literature along with all the possible adverse reactions/side effects for all the Medicines you take and that have been prescribed to you. Take any new Medicines after you have completely understood and accpet all the possible adverse reactions/side effects.   Do not drive when taking Pain medications or sleeping medications (Benzodaizepines)  Do not take more than prescribed Pain, Sleep and Anxiety Medications. It is not advisable to combine anxiety,sleep and pain medications without talking with your primary care practitioner  Special Instructions: If you have smoked or chewed Tobacco  in the last 2 yrs please stop smoking, stop any regular Alcohol  and or any Recreational drug use.  Wear Seat belts while driving.  Please note: You were cared for by a hospitalist during your hospital stay. Once you are discharged, your primary care physician will handle any further medical issues. Please note that NO REFILLS for any discharge medications will be authorized once you are discharged, as it is imperative that you return to your primary care physician (or establish a relationship with a primary care physician if you do not have one) for your post hospital discharge needs so that they can reassess your need for medications and monitor your lab values.   Increase activity slowly   Complete by: As directed       Allergies as of 03/21/2021       Reactions   Contrast Media [iodinated Contrast Media] Other (See Comments)   Itching.( 13 hr pre medication regimen worked fine. )   Tdap [tetanus-diphth-acell Pertussis] Hives   Streptococcus (diplococcus) Pneumoniae [streptococci] Other (See Comments)   Shortness of breath        Medication List     STOP taking these medications    ascorbic acid 500 MG tablet Commonly known as: VITAMIN C   furosemide 20 MG tablet Commonly known as: LASIX       TAKE these medications    acetaminophen 325 MG tablet Commonly known  as: TYLENOL Take 2 tablets (650 mg total) by mouth 3 (three) times daily. What changed:  when to take this reasons to take this   albuterol (2.5 MG/3ML) 0.083% nebulizer solution Commonly known as: PROVENTIL Take 3 mLs (2.5 mg total) by nebulization every 2 (two) hours as needed for wheezing.   amLODipine 10 MG tablet Commonly known as: NORVASC Take 1 tablet (10 mg total) by mouth every morning. What changed:  medication strength how much to take   amoxicillin-clavulanate 875-125 MG tablet Commonly known as: Augmentin Take 1 tablet by mouth 2 (two) times daily for 2 days.   aspirin EC 81 MG tablet Take 1 tablet (81 mg total) by mouth daily. Swallow whole.   benazepril 20 MG tablet Commonly known as: LOTENSIN Take 20 mg by mouth every morning.   bisacodyl 5 MG EC tablet Commonly known as: DULCOLAX Take 1 tablet (5 mg total) by mouth daily as needed for moderate constipation.   cholecalciferol 25 MCG (1000 UNIT) tablet Commonly known as: VITAMIN D3 Take 1,000 Units by mouth daily.   denosumab 60 MG/ML Sosy injection Commonly known as: PROLIA Inject 60 mg into the  skin every 6 (six) months.   docusate sodium 100 MG capsule Commonly known as: COLACE Take 2 capsules (200 mg total) by mouth 2 (two) times daily. What changed:  when to take this reasons to take this   fentaNYL 50 MCG/HR Commonly known as: DURAGESIC Place 1 patch onto the skin every 3 (three) days.   folic acid 1 MG tablet Commonly known as: FOLVITE Take 1 mg by mouth daily.   guaiFENesin 600 MG 12 hr tablet Commonly known as: MUCINEX Take 1 tablet (600 mg total) by mouth 2 (two) times daily as needed for cough.   levothyroxine 125 MCG tablet Commonly known as: SYNTHROID Take 125 mcg by mouth daily before breakfast.   LORazepam 0.5 MG tablet Commonly known as: ATIVAN Take 0.5 mg by mouth 2 (two) times daily as needed for anxiety.   methotrexate 50 MG/2ML injection Inject 0.9 mLs (22.5 mg  total) into the muscle every Wednesday. 0.9 mL - takes orally in juice Start taking on: March 28, 2021   Multivitamin Gummies Adult [place] 1 tablet by mouth daily.   omeprazole 20 MG tablet Commonly known as: PRILOSEC OTC Take 20 mg by mouth every other day.   oxyCODONE-acetaminophen 10-325 MG tablet Commonly known as: PERCOCET Take 1 tablet by mouth every 6 (six) hours as needed for pain.   polyethylene glycol 17 g packet Commonly known as: MIRALAX / GLYCOLAX Take 17 g by mouth daily as needed for mild constipation.   predniSONE 5 MG tablet Commonly known as: DELTASONE 30 mg p.o. daily for 2 days, then 20 mg p.o. daily for 2 days, then 10 mg p.o. daily for 2 days, and then resume 5 mg p.o. daily dosing What changed:  how much to take how to take this when to take this additional instructions   vitamin B-12 1000 MCG tablet Commonly known as: CYANOCOBALAMIN Take 1,000 mcg by mouth daily.        Follow-up Information     Rodrigo Ran, MD. Schedule an appointment as soon as possible for a visit in 1 week(s).   Specialty: Internal Medicine Contact information: [address] [place] [address] [phone]                Allergies  Allergen Reactions   Contrast Media [Iodinated Contrast Media] Other (See Comments)    Itching.( 13 hr pre medication regimen worked fine. )   Tdap [Tetanus-Diphth-Acell Pertussis] Hives   Streptococcus (Diplococcus) Pneumoniae [Streptococci] Other (See Comments)    Shortness of breath     Other Procedures/Studies: DG Chest Portable 1 View  Result Date: 03/17/2021 CLINICAL DATA:  88 year old female with history of shortness of breath, fatigue and weakness for the past 3 days. EXAM: PORTABLE CHEST 1 VIEW COMPARISON:  Chest x-ray 07/26/2019. FINDINGS: Widespread areas of interstitial prominence and patchy ill-defined opacities are noted throughout the left lung, most pronounced at the left lung base. No definite pleural  effusions. No pneumothorax. No evidence of pulmonary edema. Heart size is normal. Upper mediastinal contours are within normal limits. Atherosclerosis in the thoracic aorta. IMPRESSION: 1. The appearance of the chest is concerning for severe multilobar bronchopneumonia in the left lung, most evident at the left lung base. 2. Aortic atherosclerosis. Electronically Signed   By: Trudie Reed M.D.   On: 03/17/2021 09:28     TODAY-DAY OF DISCHARGE:  Subjective:   Tammi Sou today has no headache,no chest abdominal pain,no new weakness tingling or numbness, feels much better wants to go  home today.  Objective:   Blood pressure (!) 149/65, pulse 88, temperature 97.9 F (36.6 C), temperature source Oral, resp. rate 14, height 5\' 2"  (1.575 m), weight 63 kg, SpO2 93 %.  Intake/Output Summary (Last 24 hours) at 03/21/2021 0932 Last data filed at 03/21/2021 0348 Gross per 24 hour  Intake 200 ml  Output 350 ml  Net -150 ml   Filed Weights   03/17/21 0647 03/17/21 2000  Weight: 62.1 kg 63 kg    Exam: Awake Alert, Oriented *3, No new F.N deficits, Normal affect [place].AT,PERRAL Supple Neck,No JVD, No cervical lymphadenopathy appriciated.  Symmetrical Chest wall movement, Good air movement bilaterally, CTAB RRR,No Gallops,Rubs or new Murmurs, No Parasternal Heave +ve B.Sounds, Abd Soft, Non tender, No organomegaly appriciated, No rebound -guarding or rigidity. No Cyanosis, Clubbing or edema, No new Rash or bruise   PERTINENT RADIOLOGIC STUDIES: No results found.   PERTINENT LAB RESULTS: CBC: Recent Labs    03/20/21 0123 03/20/21 1210  WBC 11.0* 16.7*  HGB 9.3* 10.5*  HCT 29.0* 32.1*  PLT 352 423*   CMET CMP     Component Value Date/Time   NA 135 03/21/2021 0451   K 2.9 (L) 03/21/2021 0451   CL 100 03/21/2021 0451   CO2 24 03/21/2021 0451   GLUCOSE 133 (H) 03/21/2021 0451   BUN 9 03/21/2021 0451   CREATININE 0.60 03/21/2021 0451   CALCIUM 7.5 (L) 03/21/2021 0451   PROT  7.1 03/17/2021 0650   ALBUMIN 3.1 (L) 03/17/2021 0650   AST 27 03/17/2021 0650   ALT 33 03/17/2021 0650   ALKPHOS 49 03/17/2021 0650   BILITOT 0.8 03/17/2021 0650   GFRNONAA >60 03/21/2021 0451   GFRAA >60 07/30/2019 0352    GFR Estimated Creatinine Clearance: 40 mL/min (by C-G formula based on SCr of 0.6 mg/dL). No results for input(s): LIPASE, AMYLASE in the last 72 hours. No results for input(s): CKTOTAL, CKMB, CKMBINDEX, TROPONINI in the last 72 hours. Invalid input(s): POCBNP No results for input(s): DDIMER in the last 72 hours. No results for input(s): HGBA1C in the last 72 hours. No results for input(s): CHOL, HDL, LDLCALC, TRIG, CHOLHDL, LDLDIRECT in the last 72 hours. No results for input(s): TSH, T4TOTAL, T3FREE, THYROIDAB in the last 72 hours.  Invalid input(s): FREET3 No results for input(s): VITAMINB12, FOLATE, FERRITIN, TIBC, IRON, RETICCTPCT in the last 72 hours. Coags: No results for input(s): INR in the last 72 hours.  Invalid input(s): PT Microbiology: Recent Results (from the past 240 hour(s))  Resp Panel by RT-PCR (Flu A&B, Covid) Nasopharyngeal Swab     Status: None   Collection Time: 03/17/21  7:00 AM   Specimen: Nasopharyngeal Swab; Nasopharyngeal(NP) swabs in vial transport medium  Result Value Ref Range Status   SARS Coronavirus 2 by RT PCR NEGATIVE NEGATIVE Final    Comment: (NOTE) SARS-CoV-2 target nucleic acids are NOT DETECTED.  The SARS-CoV-2 RNA is generally detectable in upper respiratory specimens during the acute phase of infection. The lowest concentration of SARS-CoV-2 viral copies this assay can detect is 138 copies/mL. A negative result does not preclude SARS-Cov-2 infection and should not be used as the sole basis for treatment or other patient management decisions. A negative result may occur with  improper specimen collection/handling, submission of specimen other than nasopharyngeal swab, presence of viral mutation(s) within  the areas targeted by this assay, and inadequate number of viral copies(<138 copies/mL). A negative result must be combined with clinical observations, patient history, and epidemiological information.  The expected result is Negative.  Fact Sheet for Patients:  BloggerCourse.com  Fact Sheet for Healthcare Providers:  SeriousBroker.it  This test is no t yet approved or cleared by the [place] FDA and  has been authorized for detection and/or diagnosis of SARS-CoV-2 by FDA under an Emergency Use Authorization (EUA). This EUA will remain  in effect (meaning this test can be used) for the duration of the COVID-19 declaration under Section 564(b)(1) of the Act, 21 U.S.C.section 360bbb-3(b)(1), unless the authorization is terminated  or revoked sooner.       Influenza A by PCR NEGATIVE NEGATIVE Final   Influenza B by PCR NEGATIVE NEGATIVE Final    Comment: (NOTE) The Xpert Xpress SARS-CoV-2/FLU/RSV plus assay is intended as an aid in the diagnosis of influenza from Nasopharyngeal swab specimens and should not be used as a sole basis for treatment. Nasal washings and aspirates are unacceptable for Xpert Xpress SARS-CoV-2/FLU/RSV testing.  Fact Sheet for Patients: BloggerCourse.com  Fact Sheet for Healthcare Providers: SeriousBroker.it  This test is not yet approved or cleared by the [place] FDA and has been authorized for detection and/or diagnosis of SARS-CoV-2 by FDA under an Emergency Use Authorization (EUA). This EUA will remain in effect (meaning this test can be used) for the duration of the COVID-19 declaration under Section 564(b)(1) of the Act, 21 U.S.C. section 360bbb-3(b)(1), unless the authorization is terminated or revoked.  Performed at [facility], [address] [place], [place] [address]   MRSA Next Gen by PCR, Nasal     Status: None    Collection Time: 03/17/21  4:21 PM   Specimen: Nasal Mucosa; Nasal Swab  Result Value Ref Range Status   MRSA by PCR Next Gen NOT DETECTED NOT DETECTED Final    Comment: (NOTE) The GeneXpert MRSA Assay (FDA approved for NASAL specimens only), is one component of a comprehensive MRSA colonization surveillance program. It is not intended to diagnose MRSA infection nor to guide or monitor treatment for MRSA infections. Test performance is not FDA approved in patients less than 40 years old. Performed at [facility], [address], [place], [place] [address]   Expectorated Sputum Assessment w Gram Stain, Rflx to Resp Cult     Status: None   Collection Time: 03/20/21 12:20 PM   Specimen: Expectorated Sputum  Result Value Ref Range Status   Specimen Description EXPECTORATED SPUTUM  Final   Special Requests NONE  Final   Sputum evaluation   Final    THIS SPECIMEN IS ACCEPTABLE FOR SPUTUM CULTURE Performed at [facility], [address], [place], [place] [address]    Report Status 03/20/2021 FINAL  Final  Culture, Respiratory w Gram Stain     Status: None (Preliminary result)   Collection Time: 03/20/21 12:20 PM  Result Value Ref Range Status   Specimen Description EXPECTORATED SPUTUM  Final   Special Requests NONE Reflexed from [id number]  Final   Gram Stain   Final    ABUNDANT WBC PRESENT, PREDOMINANTLY PMN RARE GRAM POSITIVE COCCI IN PAIRS RARE GRAM POSITIVE RODS Performed at [facility], [address], [place], [place] [address]    Culture PENDING  Incomplete   Report Status PENDING  Incomplete    FURTHER DISCHARGE INSTRUCTIONS:  Get Medicines reviewed and adjusted: Please take all your medications with you for your next visit with your Primary MD  Laboratory/radiological data: Please request your Primary MD to go over all hospital tests  and procedure/radiological results at the follow up, please ask your Primary MD to get all Hospital records sent to  his/her office.  In some cases, they will be blood work, cultures and biopsy results pending at the time of your discharge. Please request that your primary care M.D. goes through all the records of your hospital data and follows up on these results.  Also Note the following: If you experience worsening of your admission symptoms, develop shortness of breath, life threatening emergency, suicidal or homicidal thoughts you must seek medical attention immediately by calling 911 or calling your MD immediately  if symptoms less severe.  You must read complete instructions/literature along with all the possible adverse reactions/side effects for all the Medicines you take and that have been prescribed to you. Take any new Medicines after you have completely understood and accpet all the possible adverse reactions/side effects.   Do not drive when taking Pain medications or sleeping medications (Benzodaizepines)  Do not take more than prescribed Pain, Sleep and Anxiety Medications. It is not advisable to combine anxiety,sleep and pain medications without talking with your primary care practitioner  Special Instructions: If you have smoked or chewed Tobacco  in the last 2 yrs please stop smoking, stop any regular Alcohol  and or any Recreational drug use.  Wear Seat belts while driving.  Please note: You were cared for by a hospitalist during your hospital stay. Once you are discharged, your primary care physician will handle any further medical issues. Please note that NO REFILLS for any discharge medications will be authorized once you are discharged, as it is imperative that you return to your primary care physician (or establish a relationship with a primary care physician if you do not have one) for your post hospital discharge needs so that they can reassess your need for medications and monitor your lab values.  Total Time spent coordinating discharge including counseling, education and face to  face time equals greater than 30 minutes.  SignedJeoffrey Massed 03/21/2021 9:32 AM

## 2021-03-21 NOTE — Progress Notes (Signed)
Report called to [facility] @ [phone], given to receiving nurse Joelene Millin, LPN.  Per Percell Locus, [place] transport has been scheduled for 1pm with [facility].  Family has been notified and Discharge packet and prescriptions have been placed in chart per [place], [place]. ?

## 2021-03-22 DIAGNOSIS — E039 Hypothyroidism, unspecified: Secondary | ICD-10-CM | POA: Diagnosis not present

## 2021-03-22 DIAGNOSIS — D649 Anemia, unspecified: Secondary | ICD-10-CM | POA: Diagnosis not present

## 2021-03-22 DIAGNOSIS — R600 Localized edema: Secondary | ICD-10-CM | POA: Diagnosis not present

## 2021-03-22 DIAGNOSIS — R5381 Other malaise: Secondary | ICD-10-CM | POA: Diagnosis not present

## 2021-03-22 DIAGNOSIS — R531 Weakness: Secondary | ICD-10-CM | POA: Diagnosis not present

## 2021-03-22 DIAGNOSIS — R32 Unspecified urinary incontinence: Secondary | ICD-10-CM | POA: Diagnosis not present

## 2021-03-22 DIAGNOSIS — K219 Gastro-esophageal reflux disease without esophagitis: Secondary | ICD-10-CM | POA: Diagnosis not present

## 2021-03-22 DIAGNOSIS — J189 Pneumonia, unspecified organism: Secondary | ICD-10-CM | POA: Diagnosis not present

## 2021-03-22 LAB — CULTURE, RESPIRATORY W GRAM STAIN: Culture: NORMAL

## 2021-03-26 DIAGNOSIS — E039 Hypothyroidism, unspecified: Secondary | ICD-10-CM | POA: Diagnosis not present

## 2021-03-26 DIAGNOSIS — M069 Rheumatoid arthritis, unspecified: Secondary | ICD-10-CM | POA: Diagnosis not present

## 2021-03-26 DIAGNOSIS — J189 Pneumonia, unspecified organism: Secondary | ICD-10-CM | POA: Diagnosis not present

## 2021-03-26 DIAGNOSIS — K5900 Constipation, unspecified: Secondary | ICD-10-CM | POA: Diagnosis not present

## 2021-03-26 DIAGNOSIS — E876 Hypokalemia: Secondary | ICD-10-CM | POA: Diagnosis not present

## 2021-03-26 DIAGNOSIS — F419 Anxiety disorder, unspecified: Secondary | ICD-10-CM | POA: Diagnosis not present

## 2021-03-26 DIAGNOSIS — E538 Deficiency of other specified B group vitamins: Secondary | ICD-10-CM | POA: Diagnosis not present

## 2021-03-26 DIAGNOSIS — R5381 Other malaise: Secondary | ICD-10-CM | POA: Diagnosis not present

## 2021-03-26 DIAGNOSIS — K219 Gastro-esophageal reflux disease without esophagitis: Secondary | ICD-10-CM | POA: Diagnosis not present

## 2021-03-26 DIAGNOSIS — M810 Age-related osteoporosis without current pathological fracture: Secondary | ICD-10-CM | POA: Diagnosis not present

## 2021-03-26 DIAGNOSIS — I10 Essential (primary) hypertension: Secondary | ICD-10-CM | POA: Diagnosis not present

## 2021-03-26 DIAGNOSIS — E559 Vitamin D deficiency, unspecified: Secondary | ICD-10-CM | POA: Diagnosis not present

## 2021-04-02 DIAGNOSIS — E876 Hypokalemia: Secondary | ICD-10-CM | POA: Diagnosis not present

## 2021-04-02 DIAGNOSIS — K5900 Constipation, unspecified: Secondary | ICD-10-CM | POA: Diagnosis not present

## 2021-04-02 DIAGNOSIS — R5381 Other malaise: Secondary | ICD-10-CM | POA: Diagnosis not present

## 2021-04-02 DIAGNOSIS — M069 Rheumatoid arthritis, unspecified: Secondary | ICD-10-CM | POA: Diagnosis not present

## 2021-04-02 DIAGNOSIS — D649 Anemia, unspecified: Secondary | ICD-10-CM | POA: Diagnosis not present

## 2021-04-02 DIAGNOSIS — I10 Essential (primary) hypertension: Secondary | ICD-10-CM | POA: Diagnosis not present

## 2021-04-02 DIAGNOSIS — J189 Pneumonia, unspecified organism: Secondary | ICD-10-CM | POA: Diagnosis not present

## 2021-04-02 DIAGNOSIS — E538 Deficiency of other specified B group vitamins: Secondary | ICD-10-CM | POA: Diagnosis not present

## 2021-04-02 DIAGNOSIS — E039 Hypothyroidism, unspecified: Secondary | ICD-10-CM | POA: Diagnosis not present

## 2021-04-02 DIAGNOSIS — R600 Localized edema: Secondary | ICD-10-CM | POA: Diagnosis not present

## 2021-04-02 DIAGNOSIS — K219 Gastro-esophageal reflux disease without esophagitis: Secondary | ICD-10-CM | POA: Diagnosis not present

## 2021-04-09 DIAGNOSIS — M810 Age-related osteoporosis without current pathological fracture: Secondary | ICD-10-CM | POA: Diagnosis not present

## 2021-04-09 DIAGNOSIS — K5900 Constipation, unspecified: Secondary | ICD-10-CM | POA: Diagnosis not present

## 2021-04-09 DIAGNOSIS — D649 Anemia, unspecified: Secondary | ICD-10-CM | POA: Diagnosis not present

## 2021-04-09 DIAGNOSIS — J189 Pneumonia, unspecified organism: Secondary | ICD-10-CM | POA: Diagnosis not present

## 2021-04-09 DIAGNOSIS — E876 Hypokalemia: Secondary | ICD-10-CM | POA: Diagnosis not present

## 2021-04-09 DIAGNOSIS — R600 Localized edema: Secondary | ICD-10-CM | POA: Diagnosis not present

## 2021-04-09 DIAGNOSIS — R5381 Other malaise: Secondary | ICD-10-CM | POA: Diagnosis not present

## 2021-04-10 DIAGNOSIS — R059 Cough, unspecified: Secondary | ICD-10-CM | POA: Diagnosis not present

## 2021-04-16 DIAGNOSIS — I10 Essential (primary) hypertension: Secondary | ICD-10-CM | POA: Diagnosis not present

## 2021-04-16 DIAGNOSIS — J189 Pneumonia, unspecified organism: Secondary | ICD-10-CM | POA: Diagnosis not present

## 2021-04-16 DIAGNOSIS — D649 Anemia, unspecified: Secondary | ICD-10-CM | POA: Diagnosis not present

## 2021-04-16 DIAGNOSIS — E559 Vitamin D deficiency, unspecified: Secondary | ICD-10-CM | POA: Diagnosis not present

## 2021-04-16 DIAGNOSIS — M069 Rheumatoid arthritis, unspecified: Secondary | ICD-10-CM | POA: Diagnosis not present

## 2021-04-16 DIAGNOSIS — R5381 Other malaise: Secondary | ICD-10-CM | POA: Diagnosis not present

## 2021-04-16 DIAGNOSIS — G894 Chronic pain syndrome: Secondary | ICD-10-CM | POA: Diagnosis not present

## 2021-04-16 DIAGNOSIS — E039 Hypothyroidism, unspecified: Secondary | ICD-10-CM | POA: Diagnosis not present

## 2021-04-16 DIAGNOSIS — K219 Gastro-esophageal reflux disease without esophagitis: Secondary | ICD-10-CM | POA: Diagnosis not present

## 2021-04-16 DIAGNOSIS — M810 Age-related osteoporosis without current pathological fracture: Secondary | ICD-10-CM | POA: Diagnosis not present

## 2021-04-16 DIAGNOSIS — E876 Hypokalemia: Secondary | ICD-10-CM | POA: Diagnosis not present

## 2021-04-16 DIAGNOSIS — K5900 Constipation, unspecified: Secondary | ICD-10-CM | POA: Diagnosis not present

## 2021-04-23 DIAGNOSIS — E039 Hypothyroidism, unspecified: Secondary | ICD-10-CM | POA: Diagnosis not present

## 2021-04-23 DIAGNOSIS — J189 Pneumonia, unspecified organism: Secondary | ICD-10-CM | POA: Diagnosis not present

## 2021-04-23 DIAGNOSIS — E876 Hypokalemia: Secondary | ICD-10-CM | POA: Diagnosis not present

## 2021-04-23 DIAGNOSIS — R5381 Other malaise: Secondary | ICD-10-CM | POA: Diagnosis not present

## 2021-04-23 DIAGNOSIS — K5900 Constipation, unspecified: Secondary | ICD-10-CM | POA: Diagnosis not present

## 2021-04-23 DIAGNOSIS — K219 Gastro-esophageal reflux disease without esophagitis: Secondary | ICD-10-CM | POA: Diagnosis not present

## 2021-04-23 DIAGNOSIS — M069 Rheumatoid arthritis, unspecified: Secondary | ICD-10-CM | POA: Diagnosis not present

## 2021-04-23 DIAGNOSIS — F32A Depression, unspecified: Secondary | ICD-10-CM | POA: Diagnosis not present

## 2021-04-23 DIAGNOSIS — F419 Anxiety disorder, unspecified: Secondary | ICD-10-CM | POA: Diagnosis not present

## 2021-04-23 DIAGNOSIS — D649 Anemia, unspecified: Secondary | ICD-10-CM | POA: Diagnosis not present

## 2021-04-23 DIAGNOSIS — E559 Vitamin D deficiency, unspecified: Secondary | ICD-10-CM | POA: Diagnosis not present

## 2021-04-23 DIAGNOSIS — I10 Essential (primary) hypertension: Secondary | ICD-10-CM | POA: Diagnosis not present

## 2021-05-06 DIAGNOSIS — M810 Age-related osteoporosis without current pathological fracture: Secondary | ICD-10-CM | POA: Diagnosis not present

## 2021-05-06 DIAGNOSIS — E785 Hyperlipidemia, unspecified: Secondary | ICD-10-CM | POA: Diagnosis not present

## 2021-05-06 DIAGNOSIS — I119 Hypertensive heart disease without heart failure: Secondary | ICD-10-CM | POA: Diagnosis not present

## 2021-05-06 DIAGNOSIS — E039 Hypothyroidism, unspecified: Secondary | ICD-10-CM | POA: Diagnosis not present

## 2021-05-07 DIAGNOSIS — G8929 Other chronic pain: Secondary | ICD-10-CM | POA: Diagnosis not present

## 2021-05-07 DIAGNOSIS — I119 Hypertensive heart disease without heart failure: Secondary | ICD-10-CM | POA: Diagnosis not present

## 2021-05-07 DIAGNOSIS — Z6825 Body mass index (BMI) 25.0-25.9, adult: Secondary | ICD-10-CM | POA: Diagnosis not present

## 2021-05-07 DIAGNOSIS — M069 Rheumatoid arthritis, unspecified: Secondary | ICD-10-CM | POA: Diagnosis not present

## 2021-05-07 DIAGNOSIS — E663 Overweight: Secondary | ICD-10-CM | POA: Diagnosis not present

## 2021-05-07 DIAGNOSIS — D649 Anemia, unspecified: Secondary | ICD-10-CM | POA: Diagnosis not present

## 2021-05-07 DIAGNOSIS — Z79899 Other long term (current) drug therapy: Secondary | ICD-10-CM | POA: Diagnosis not present

## 2021-05-07 DIAGNOSIS — E559 Vitamin D deficiency, unspecified: Secondary | ICD-10-CM | POA: Diagnosis not present

## 2021-05-07 DIAGNOSIS — E039 Hypothyroidism, unspecified: Secondary | ICD-10-CM | POA: Diagnosis not present

## 2021-05-07 DIAGNOSIS — R5381 Other malaise: Secondary | ICD-10-CM | POA: Diagnosis not present

## 2021-05-07 DIAGNOSIS — M2550 Pain in unspecified joint: Secondary | ICD-10-CM | POA: Diagnosis not present

## 2021-05-07 DIAGNOSIS — J189 Pneumonia, unspecified organism: Secondary | ICD-10-CM | POA: Diagnosis not present

## 2021-05-07 DIAGNOSIS — M0579 Rheumatoid arthritis with rheumatoid factor of multiple sites without organ or systems involvement: Secondary | ICD-10-CM | POA: Diagnosis not present

## 2021-05-07 DIAGNOSIS — G894 Chronic pain syndrome: Secondary | ICD-10-CM | POA: Diagnosis not present

## 2021-05-07 DIAGNOSIS — M810 Age-related osteoporosis without current pathological fracture: Secondary | ICD-10-CM | POA: Diagnosis not present

## 2021-06-06 DIAGNOSIS — M069 Rheumatoid arthritis, unspecified: Secondary | ICD-10-CM | POA: Diagnosis not present

## 2021-06-06 DIAGNOSIS — G894 Chronic pain syndrome: Secondary | ICD-10-CM | POA: Diagnosis not present

## 2021-06-19 ENCOUNTER — Telehealth: Payer: Self-pay

## 2021-06-19 NOTE — Telephone Encounter (Signed)
Spoke with patient/patient's daughter Marcie Bal and scheduled a telephonic Palliative Consult for 06/25/21 @ 2 PM.   Consent obtained; updated Netsmart, Team List and Epic.

## 2021-06-25 ENCOUNTER — Encounter: Payer: Self-pay | Admitting: Family Medicine

## 2021-06-25 ENCOUNTER — Other Ambulatory Visit: Payer: Medicare Other | Admitting: Family Medicine

## 2021-06-25 DIAGNOSIS — Z515 Encounter for palliative care: Secondary | ICD-10-CM | POA: Insufficient documentation

## 2021-06-25 DIAGNOSIS — M86461 Chronic osteomyelitis with draining sinus, right tibia and fibula: Secondary | ICD-10-CM

## 2021-06-25 NOTE — Progress Notes (Signed)
[facility] Consult Note Telephone: [phone]  Fax: [phone]   Date of encounter: 06/25/21 2:09 PM PATIENT NAME: Jocelyn Bauer [address] [place] [address]   [phone] (home)  DOB: 09-07-1928 MRN: [id number] PRIMARY CARE PROVIDER:    [name], MD,  Jocelyn Bauer [id number]  REFERRING PROVIDER:   [name], MD [address] [place],  [place] [address] [phone]  RESPONSIBLE PARTY:    Contact Information     Name Relation Home Work Mobile   Roff Daughter [phone]  [phone]   Zayna, Toste [phone]     Felix, Pratt    [phone]   Roanna Banning  [phone]        I connected with  Jocelyn Bauer on 06/25/21 by phone as she was unable to use audiovisual meeting platform and verified that I am speaking with the correct person using two identifiers.   I discussed the limitations of evaluation and management by telemedicine. The patient expressed understanding and agreed to proceed.   Palliative Care was asked to follow this patient by consultation request of  Jocelyn Infante, MD to address advance care planning and complex medical decision making. This is the initial visit.          ASSESSMENT, SYMPTOM MANAGEMENT AND PLAN / RECOMMENDATIONS:   Chronic osteomyelitis of right tibia with draining sinus Infection resolved with no further drainage, ambulating without difficulty on that leg. Monitor for recurrent s/sx of infection given pt is on immunosuppressive therapy.  Palliative Care Encounter Follow up with home visit in 1-2 weeks to address goals of care and in-home in person evaluation.   Follow up Palliative Care Visit: Palliative care will continue to follow for complex medical decision making, advance care planning, and clarification of goals. Return 2 weeks or prn.    This visit was coded based on time:  PPS: 60%  HOSPICE  ELIGIBILITY/DIAGNOSIS: TBD  Chief Complaint:  [name] received a referral to follow up with patient for chronic disease management of osteomyelitis involving hardware in setting of rheumatoid arthritis on DMARD therapy. Palliative Care is also following to help define/refine goals of care.  HISTORY OF PRESENT ILLNESS:  Jocelyn Bauer is a 88 y.o. year old female with chronic osteomyelitis of right tibia hardware with Rheumatoid arthritis receiving weekly Methotrexate injections on Wednesdays and daily Prednisone. She has HTN, GERD, hypothyroidism and chronic pain for which she takes Percocet BID with good relief and is on Duragesic 50 mcg/hr patch q 3 days. She has a recent hx of multifocal pneumonia which has resolved. She also has chronic back and neck pain and hx of multiple joint replacements. She has been treated for the osteomyelitis, states currently her ankle is not draining and she is walking with a walker. She completed an ID recommended course of Linezolid and Cipro for a Pseudomonas infection of hardware for which she underwent removal in January. Her inflammatory markers had normalized as of her last visit with ID on 03/01/21 with good healing of her wound.  She was released by ID with instruction to monitor for s/sx of infection and return if present. Her  Lives with daughter Jocelyn Bauer who goes in to work at 6:30 am until 4 pm.  Following her hardware removal she stayed 1-2 months at [facility] to rehab.  Eating well. Has some DOE. Has trouble swallowing larger pills.  No trouble with constipation.  She is continent of bowel and bladder  but has difficulty with bathing as she is unable to get in the tub.  Most recent available labs were done after pt was diagnosed with multilobar pneumonia showing significant leukocytosis and hypokalemia.  Pt was in SNF and likely had follow up labs done there which are not available for current review.  History obtained from  review of EMR, discussion with [name].  I reviewed available labs, medications, imaging, studies and related documents from the EMR.  Records reviewed and summarized above.   ROS General: NAD ENMT: denies dysphagia, has trouble with swallowing large pills (mostly vitamins-has switched to some gummies) Cardiovascular: denies chest pain, endorses some DOE Pulmonary: denies cough, denies increased SOB Abdomen: endorses good appetite, denies constipation, endorses continence of bowel GU: denies dysuria, endorses continence of urine MSK:  denies increased weakness, no falls reported, uses cane or walker for ambulation Skin: denies draining wound of right ankle Neurological: endorses chronic pain of multiple sites, denies insomnia Psych: Endorses positive mood   Physical Exam: audible only (phone) Current and past weights: 138 lbs 14.2 oz as of 03/17/21, 01/10/21 stated weight 137 lbs CV:  Able to speak in complete sentences without stopping to breathe Pulmonary:  no audible SOB or tachypnea Neuro:  some short term memory deficits noted Psych: non-anxious affect, A and O x 3   CURRENT PROBLEM LIST:  Patient Active Problem List   Diagnosis Date Noted   Multifocal pneumonia 03/17/2021   RA (rheumatoid arthritis) ([facility]) 03/17/2021   DNR (do not resuscitate) 03/17/2021   Infected hardware in right leg ([facility])    Chronic osteomyelitis of tibia with draining sinus ([facility])    Contusion of left elbow 06/14/2020   Closed trimalleolar fracture of right ankle    Chronic, continuous use of opioids 07/28/2019   Bilateral ankle fractures 07/26/2019   Right elbow pain 07/14/2015   Presence of right artificial elbow joint 07/13/2015   Hypertension    Hypothyroidism    Hyperlipidemia    GERD (gastroesophageal reflux disease)    Anxiety    Osteoporosis    Chronic neck and back pain    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   PAST MEDICAL HISTORY:  Active Ambulatory Problems    Diagnosis Date  Noted   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   Presence of right artificial elbow joint 07/13/2015   Hypertension    Hypothyroidism    Hyperlipidemia    GERD (gastroesophageal reflux disease)    Anxiety    Osteoporosis    Chronic neck and back pain    Right elbow pain 07/14/2015   Bilateral ankle fractures 07/26/2019   Chronic, continuous use of opioids 07/28/2019   Closed trimalleolar fracture of right ankle    Contusion of left elbow 06/14/2020   Infected hardware in right leg ([facility])    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)    Multifocal pneumonia 03/17/2021   RA (rheumatoid arthritis) ([facility]) 03/17/2021   DNR (do not resuscitate) 03/17/2021   Resolved Ambulatory Problems    Diagnosis Date Noted   No Resolved Ambulatory Problems   Past Medical History:  Diagnosis Date   Arthritis    Chronic pain    Elbow locking    Headache    Osteoarthritis    Pneumonia    Shortness of breath    UTI (urinary tract infection)    SOCIAL HX:  Social History   Tobacco Use   Smoking status: Former    Years: 10.00  Types: Cigarettes   Smokeless tobacco: Never   Tobacco comments:    QUIT SMOKING IN THE 70"S  Substance Use Topics   Alcohol use: No   FAMILY HX:  Family History  Problem Relation Age of Onset   Heart attack Mother    Prostate cancer Father    Lung disease Brother    Heart attack Sister    COPD Sister    Cancer Sister        She does not know which type of cancer       Preferred Pharmacy: ALLERGIES:  Allergies  Allergen Reactions   Contrast Media [Iodinated Contrast Media] Other (See Comments)    Itching.( 13 hr pre medication regimen worked fine. )   Tdap [Tetanus-Diphth-Acell Pertussis] Hives   Streptococcus (Diplococcus) Pneumoniae [Streptococci] Other (See Comments)    Shortness of breath     PERTINENT MEDICATIONS:  Outpatient Encounter Medications as of 06/25/2021  Medication Sig   acetaminophen (TYLENOL) 325 MG tablet Take  2 tablets (650 mg total) by mouth 3 (three) times daily. (Patient taking differently: Take 650 mg by mouth every 6 (six) hours as needed for moderate pain.)   albuterol (PROVENTIL) (2.5 MG/3ML) 0.083% nebulizer solution Take 3 mLs (2.5 mg total) by nebulization every 2 (two) hours as needed for wheezing.   amLODipine (NORVASC) 10 MG tablet Take 1 tablet (10 mg total) by mouth every morning.   aspirin EC 81 MG tablet Take 1 tablet (81 mg total) by mouth daily. Swallow whole.   benazepril (LOTENSIN) 20 MG tablet Take 20 mg by mouth every morning.   bisacodyl (DULCOLAX) 5 MG EC tablet Take 1 tablet (5 mg total) by mouth daily as needed for moderate constipation.   cholecalciferol (VITAMIN D3) 25 MCG (1000 UNIT) tablet Take 1,000 Units by mouth daily.   denosumab (PROLIA) 60 MG/ML SOSY injection Inject 60 mg into the skin every 6 (six) months.   docusate sodium (COLACE) 100 MG capsule Take 2 capsules (200 mg total) by mouth 2 (two) times daily. (Patient taking differently: Take 200 mg by mouth daily as needed for moderate constipation.)   fentaNYL (DURAGESIC) 50 MCG/HR Place 1 patch onto the skin every 3 (three) days.   folic acid (FOLVITE) 1 MG tablet Take 1 mg by mouth daily.   guaiFENesin (MUCINEX) 600 MG 12 hr tablet Take 1 tablet (600 mg total) by mouth 2 (two) times daily as needed for cough.   levothyroxine (SYNTHROID) 125 MCG tablet Take 125 mcg by mouth daily before breakfast.   LORazepam (ATIVAN) 0.5 MG tablet Take 0.5 mg by mouth 2 (two) times daily as needed for anxiety.   methotrexate 50 MG/2ML injection Inject 0.9 mLs (22.5 mg total) into the muscle every Wednesday. 0.9 mL - takes orally in juice   Multiple Vitamins-Minerals (MULTIVITAMIN GUMMIES ADULT) CHEW Chew 1 tablet by mouth daily.   omeprazole (PRILOSEC OTC) 20 MG tablet Take 20 mg by mouth every other day.   oxyCODONE-acetaminophen (PERCOCET) 10-325 MG tablet Take 1 tablet by mouth every 6 (six) hours as needed for pain.    polyethylene glycol (MIRALAX / GLYCOLAX) 17 g packet Take 17 g by mouth daily as needed for mild constipation.   predniSONE (DELTASONE) 5 MG tablet 30 mg p.o. daily for 2 days, then 20 mg p.o. daily for 2 days, then 10 mg p.o. daily for 2 days, and then resume 5 mg p.o. daily dosing   vitamin B-12 (CYANOCOBALAMIN) 1000 MCG tablet Take 1,000 mcg by mouth  daily.   No facility-administered encounter medications on file as of 06/25/2021.     ------------------------------------------------------------------------------------------- Advance Care Planning/Goals of Care: Goals include to maximize quality of life and symptom management.  Exploration of goals of care in the event of a sudden injury or illness-has DNR  CODE STATUS: DNR    Thank you for the opportunity to participate in the care of Ms. Meda Coffee.  The palliative care team will continue to follow. Please call our office at [phone] if we can be of additional assistance.   Marijo Conception, FNP-C  COVID-19 PATIENT SCREENING TOOL Asked and negative response unless otherwise noted:  Have you had symptoms of covid, tested positive or been in contact with someone with symptoms/positive test in the past 5-10 days? no

## 2021-06-28 ENCOUNTER — Inpatient Hospital Stay (HOSPITAL_COMMUNITY)
Admission: EM | Admit: 2021-06-28 | Discharge: 2021-07-04 | DRG: 871 | Disposition: A | Discharge status: Skilled Nursing Facility | Payer: Medicare Other | Attending: Internal Medicine | Admitting: Internal Medicine

## 2021-06-28 ENCOUNTER — Other Ambulatory Visit: Payer: Self-pay

## 2021-06-28 ENCOUNTER — Emergency Department (HOSPITAL_COMMUNITY): Payer: Medicare Other

## 2021-06-28 DIAGNOSIS — M1990 Unspecified osteoarthritis, unspecified site: Secondary | ICD-10-CM | POA: Diagnosis present

## 2021-06-28 DIAGNOSIS — Z20822 Contact with and (suspected) exposure to covid-19: Secondary | ICD-10-CM | POA: Diagnosis present

## 2021-06-28 DIAGNOSIS — D849 Immunodeficiency, unspecified: Secondary | ICD-10-CM | POA: Diagnosis present

## 2021-06-28 DIAGNOSIS — E890 Postprocedural hypothyroidism: Secondary | ICD-10-CM | POA: Diagnosis present

## 2021-06-28 DIAGNOSIS — Z7982 Long term (current) use of aspirin: Secondary | ICD-10-CM | POA: Diagnosis not present

## 2021-06-28 DIAGNOSIS — E039 Hypothyroidism, unspecified: Secondary | ICD-10-CM | POA: Diagnosis present

## 2021-06-28 DIAGNOSIS — G894 Chronic pain syndrome: Secondary | ICD-10-CM | POA: Diagnosis present

## 2021-06-28 DIAGNOSIS — F419 Anxiety disorder, unspecified: Secondary | ICD-10-CM | POA: Diagnosis not present

## 2021-06-28 DIAGNOSIS — A419 Sepsis, unspecified organism: Secondary | ICD-10-CM | POA: Diagnosis not present

## 2021-06-28 DIAGNOSIS — R0602 Shortness of breath: Secondary | ICD-10-CM | POA: Diagnosis not present

## 2021-06-28 DIAGNOSIS — F39 Unspecified mood [affective] disorder: Secondary | ICD-10-CM | POA: Diagnosis present

## 2021-06-28 DIAGNOSIS — R000 Tachycardia, unspecified: Secondary | ICD-10-CM | POA: Diagnosis not present

## 2021-06-28 DIAGNOSIS — Z79899 Other long term (current) drug therapy: Secondary | ICD-10-CM

## 2021-06-28 DIAGNOSIS — I10 Essential (primary) hypertension: Secondary | ICD-10-CM | POA: Diagnosis present

## 2021-06-28 DIAGNOSIS — J9601 Acute respiratory failure with hypoxia: Secondary | ICD-10-CM | POA: Diagnosis present

## 2021-06-28 DIAGNOSIS — Z91041 Radiographic dye allergy status: Secondary | ICD-10-CM

## 2021-06-28 DIAGNOSIS — G8929 Other chronic pain: Secondary | ICD-10-CM | POA: Diagnosis not present

## 2021-06-28 DIAGNOSIS — Z79890 Hormone replacement therapy (postmenopausal): Secondary | ICD-10-CM | POA: Diagnosis not present

## 2021-06-28 DIAGNOSIS — Z8701 Personal history of pneumonia (recurrent): Secondary | ICD-10-CM

## 2021-06-28 DIAGNOSIS — M549 Dorsalgia, unspecified: Secondary | ICD-10-CM | POA: Diagnosis not present

## 2021-06-28 DIAGNOSIS — M810 Age-related osteoporosis without current pathological fracture: Secondary | ICD-10-CM | POA: Diagnosis present

## 2021-06-28 DIAGNOSIS — Z87440 Personal history of urinary (tract) infections: Secondary | ICD-10-CM

## 2021-06-28 DIAGNOSIS — K219 Gastro-esophageal reflux disease without esophagitis: Secondary | ICD-10-CM | POA: Diagnosis present

## 2021-06-28 DIAGNOSIS — M069 Rheumatoid arthritis, unspecified: Secondary | ICD-10-CM | POA: Diagnosis present

## 2021-06-28 DIAGNOSIS — Z96642 Presence of left artificial hip joint: Secondary | ICD-10-CM | POA: Diagnosis present

## 2021-06-28 DIAGNOSIS — Z66 Do not resuscitate: Secondary | ICD-10-CM | POA: Diagnosis present

## 2021-06-28 DIAGNOSIS — E785 Hyperlipidemia, unspecified: Secondary | ICD-10-CM | POA: Diagnosis present

## 2021-06-28 DIAGNOSIS — Z7401 Bed confinement status: Secondary | ICD-10-CM | POA: Diagnosis not present

## 2021-06-28 DIAGNOSIS — M2550 Pain in unspecified joint: Secondary | ICD-10-CM | POA: Diagnosis not present

## 2021-06-28 DIAGNOSIS — Z887 Allergy status to serum and vaccine status: Secondary | ICD-10-CM

## 2021-06-28 DIAGNOSIS — Z741 Need for assistance with personal care: Secondary | ICD-10-CM | POA: Diagnosis not present

## 2021-06-28 DIAGNOSIS — R059 Cough, unspecified: Secondary | ICD-10-CM | POA: Diagnosis not present

## 2021-06-28 DIAGNOSIS — Z8249 Family history of ischemic heart disease and other diseases of the circulatory system: Secondary | ICD-10-CM

## 2021-06-28 DIAGNOSIS — R2681 Unsteadiness on feet: Secondary | ICD-10-CM | POA: Diagnosis not present

## 2021-06-28 DIAGNOSIS — F1190 Opioid use, unspecified, uncomplicated: Secondary | ICD-10-CM | POA: Diagnosis present

## 2021-06-28 DIAGNOSIS — R41841 Cognitive communication deficit: Secondary | ICD-10-CM | POA: Diagnosis not present

## 2021-06-28 DIAGNOSIS — J189 Pneumonia, unspecified organism: Secondary | ICD-10-CM

## 2021-06-28 DIAGNOSIS — Z7952 Long term (current) use of systemic steroids: Secondary | ICD-10-CM | POA: Diagnosis not present

## 2021-06-28 DIAGNOSIS — R0902 Hypoxemia: Secondary | ICD-10-CM | POA: Diagnosis not present

## 2021-06-28 DIAGNOSIS — R9431 Abnormal electrocardiogram [ECG] [EKG]: Secondary | ICD-10-CM | POA: Diagnosis not present

## 2021-06-28 DIAGNOSIS — Z79891 Long term (current) use of opiate analgesic: Secondary | ICD-10-CM | POA: Diagnosis not present

## 2021-06-28 DIAGNOSIS — R1310 Dysphagia, unspecified: Secondary | ICD-10-CM | POA: Diagnosis not present

## 2021-06-28 DIAGNOSIS — M6281 Muscle weakness (generalized): Secondary | ICD-10-CM | POA: Diagnosis not present

## 2021-06-28 DIAGNOSIS — Z87891 Personal history of nicotine dependence: Secondary | ICD-10-CM

## 2021-06-28 DIAGNOSIS — Z9981 Dependence on supplemental oxygen: Secondary | ICD-10-CM | POA: Diagnosis not present

## 2021-06-28 LAB — URINALYSIS, ROUTINE W REFLEX MICROSCOPIC
Bilirubin Urine: NEGATIVE
Glucose, UA: NEGATIVE mg/dL
Ketones, ur: NEGATIVE mg/dL
Nitrite: NEGATIVE
Protein, ur: NEGATIVE mg/dL
Specific Gravity, Urine: 1.015 (ref 1.005–1.030)
pH: 6 (ref 5.0–8.0)

## 2021-06-28 LAB — PROCALCITONIN: Procalcitonin: 0.18 ng/mL

## 2021-06-28 LAB — CBC WITH DIFFERENTIAL/PLATELET
Abs Immature Granulocytes: 0.09 10*3/uL — ABNORMAL HIGH (ref 0.00–0.07)
Basophils Absolute: 0 10*3/uL (ref 0.0–0.1)
Basophils Relative: 0 %
Eosinophils Absolute: 0.1 10*3/uL (ref 0.0–0.5)
Eosinophils Relative: 1 %
HCT: 28 % — ABNORMAL LOW (ref 36.0–46.0)
Hemoglobin: 9.2 g/dL — ABNORMAL LOW (ref 12.0–15.0)
Immature Granulocytes: 1 %
Lymphocytes Relative: 6 %
Lymphs Abs: 0.6 10*3/uL — ABNORMAL LOW (ref 0.7–4.0)
MCH: 29.5 pg (ref 26.0–34.0)
MCHC: 32.9 g/dL (ref 30.0–36.0)
MCV: 89.7 fL (ref 80.0–100.0)
Monocytes Absolute: 0.1 10*3/uL (ref 0.1–1.0)
Monocytes Relative: 1 %
Neutro Abs: 8.9 10*3/uL — ABNORMAL HIGH (ref 1.7–7.7)
Neutrophils Relative %: 91 %
Platelets: 407 10*3/uL — ABNORMAL HIGH (ref 150–400)
RBC: 3.12 MIL/uL — ABNORMAL LOW (ref 3.87–5.11)
RDW: 17.2 % — ABNORMAL HIGH (ref 11.5–15.5)
WBC: 9.7 10*3/uL (ref 4.0–10.5)
nRBC: 0 % (ref 0.0–0.2)

## 2021-06-28 LAB — COMPREHENSIVE METABOLIC PANEL
ALT: 16 U/L (ref 0–44)
AST: 22 U/L (ref 15–41)
Albumin: 2.5 g/dL — ABNORMAL LOW (ref 3.5–5.0)
Alkaline Phosphatase: 60 U/L (ref 38–126)
Anion gap: 15 (ref 5–15)
BUN: 15 mg/dL (ref 8–23)
CO2: 22 mmol/L (ref 22–32)
Calcium: 8.2 mg/dL — ABNORMAL LOW (ref 8.9–10.3)
Chloride: 100 mmol/L (ref 98–111)
Creatinine, Ser: 0.96 mg/dL (ref 0.44–1.00)
GFR, Estimated: 56 mL/min — ABNORMAL LOW (ref >60)
Glucose, Bld: 204 mg/dL — ABNORMAL HIGH (ref 70–99)
Potassium: 3.1 mmol/L — ABNORMAL LOW (ref 3.5–5.1)
Sodium: 137 mmol/L (ref 135–145)
Total Bilirubin: 0.5 mg/dL (ref 0.3–1.2)
Total Protein: 6.4 g/dL — ABNORMAL LOW (ref 6.5–8.1)

## 2021-06-28 LAB — PROTIME-INR
INR: 1.1 (ref 0.8–1.2)
Prothrombin Time: 13.7 seconds (ref 11.4–15.2)

## 2021-06-28 LAB — SARS CORONAVIRUS 2 BY RT PCR: SARS Coronavirus 2 by RT PCR: NEGATIVE

## 2021-06-28 LAB — STREP PNEUMONIAE URINARY ANTIGEN: Strep Pneumo Urinary Antigen: NEGATIVE

## 2021-06-28 LAB — LACTIC ACID, PLASMA
Lactic Acid, Venous: 2.2 mmol/L (ref 0.5–1.9)
Lactic Acid, Venous: 1.7 mmol/L (ref 0.5–1.9)

## 2021-06-28 LAB — APTT: aPTT: 30 seconds (ref 24–36)

## 2021-06-28 MED ORDER — FENTANYL 50 MCG/HR TD PT72
1.0000 | MEDICATED_PATCH | TRANSDERMAL | Status: DC
  Administered 2021-06-28 – 2021-07-01 (×2): 1 via TRANSDERMAL
  Filled 2021-06-28 (×5): qty 1

## 2021-06-28 MED ORDER — DOCUSATE SODIUM 100 MG PO CAPS
100.0000 mg | ORAL_CAPSULE | Freq: Two times a day (BID) | ORAL | Status: DC
  Administered 2021-06-28 – 2021-07-04 (×10): 100 mg via ORAL
  Filled 2021-06-28 (×12): qty 1

## 2021-06-28 MED ORDER — LORAZEPAM 0.5 MG PO TABS
0.5000 mg | ORAL_TABLET | Freq: Two times a day (BID) | ORAL | Status: DC | PRN
Start: 2021-06-28 — End: 2021-07-04
  Administered 2021-06-28 – 2021-07-03 (×6): 0.5 mg via ORAL
  Filled 2021-06-28 (×6): qty 1

## 2021-06-28 MED ORDER — GUAIFENESIN ER 600 MG PO TB12
600.0000 mg | ORAL_TABLET | Freq: Two times a day (BID) | ORAL | Status: DC | PRN
Start: 2021-06-28 — End: 2021-07-04

## 2021-06-28 MED ORDER — ALBUTEROL SULFATE (2.5 MG/3ML) 0.083% IN NEBU
2.5000 mg | INHALATION_SOLUTION | RESPIRATORY_TRACT | Status: DC | PRN
Start: 2021-06-28 — End: 2021-07-04

## 2021-06-28 MED ORDER — ACETAMINOPHEN 650 MG RE SUPP: 650.0000 mg | Freq: Four times a day (QID) | RECTAL | Status: DC | PRN

## 2021-06-28 MED ORDER — OXYCODONE-ACETAMINOPHEN 5-325 MG PO TABS
1.0000 | ORAL_TABLET | Freq: Four times a day (QID) | ORAL | Status: DC | PRN
  Administered 2021-06-28 – 2021-07-04 (×16): 1 via ORAL
  Filled 2021-06-28 (×16): qty 1

## 2021-06-28 MED ORDER — ONDANSETRON HCL 4 MG/2ML IJ SOLN: 4.0000 mg | Freq: Four times a day (QID) | INTRAMUSCULAR | Status: DC | PRN

## 2021-06-28 MED ORDER — SODIUM CHLORIDE 0.9 % IV SOLN
2.0000 g | INTRAVENOUS | Status: AC
  Administered 2021-06-28 – 2021-07-02 (×5): 2 g via INTRAVENOUS
  Filled 2021-06-28 (×5): qty 20

## 2021-06-28 MED ORDER — BISACODYL 5 MG PO TBEC: 5.0000 mg | DELAYED_RELEASE_TABLET | Freq: Every day | ORAL | Status: DC | PRN

## 2021-06-28 MED ORDER — LEVOTHYROXINE SODIUM 100 MCG PO TABS
125.0000 ug | ORAL_TABLET | Freq: Every day | ORAL | Status: DC
Start: 2021-06-28 — End: 2021-07-04
  Administered 2021-06-28 – 2021-07-04 (×7): 125 ug via ORAL
  Filled 2021-06-28 (×7): qty 1

## 2021-06-28 MED ORDER — SODIUM CHLORIDE 0.9 % IV SOLN
500.0000 mg | INTRAVENOUS | Status: DC
  Administered 2021-06-28 – 2021-07-01 (×4): 500 mg via INTRAVENOUS
  Filled 2021-06-28 (×4): qty 5

## 2021-06-28 MED ORDER — SODIUM CHLORIDE 0.9 % IV SOLN
INTRAVENOUS | Status: DC
Start: 2021-06-28 — End: 2021-06-29

## 2021-06-28 MED ORDER — POLYETHYLENE GLYCOL 3350 17 G PO PACK: 17.0000 g | PACK | Freq: Every day | ORAL | Status: DC | PRN

## 2021-06-28 MED ORDER — OXYCODONE-ACETAMINOPHEN 10-325 MG PO TABS: 1.0000 | ORAL_TABLET | Freq: Four times a day (QID) | ORAL | Status: DC | PRN

## 2021-06-28 MED ORDER — ASPIRIN 81 MG PO TBEC
81.0000 mg | DELAYED_RELEASE_TABLET | Freq: Every day | ORAL | Status: DC
  Administered 2021-06-28 – 2021-07-04 (×7): 81 mg via ORAL
  Filled 2021-06-28 (×7): qty 1

## 2021-06-28 MED ORDER — SERTRALINE HCL 50 MG PO TABS
50.0000 mg | ORAL_TABLET | Freq: Every day | ORAL | Status: DC
  Administered 2021-06-28 – 2021-07-04 (×7): 50 mg via ORAL
  Filled 2021-06-28 (×7): qty 1

## 2021-06-28 MED ORDER — SODIUM CHLORIDE 0.9% FLUSH
3.0000 mL | Freq: Two times a day (BID) | INTRAVENOUS | Status: DC
  Administered 2021-06-28 – 2021-07-04 (×11): 3 mL via INTRAVENOUS

## 2021-06-28 MED ORDER — HYDRALAZINE HCL 20 MG/ML IJ SOLN: 5.0000 mg | INTRAMUSCULAR | Status: DC | PRN

## 2021-06-28 MED ORDER — ACETAMINOPHEN 325 MG PO TABS
650.0000 mg | ORAL_TABLET | Freq: Four times a day (QID) | ORAL | Status: DC | PRN
  Administered 2021-07-02 – 2021-07-04 (×3): 650 mg via ORAL
  Filled 2021-06-28 (×3): qty 2

## 2021-06-28 MED ORDER — OXYCODONE HCL 5 MG PO TABS
5.0000 mg | ORAL_TABLET | Freq: Four times a day (QID) | ORAL | Status: DC | PRN
  Administered 2021-06-28 – 2021-07-04 (×17): 5 mg via ORAL
  Filled 2021-06-28 (×17): qty 1

## 2021-06-28 MED ORDER — METHYLPREDNISOLONE SODIUM SUCC 125 MG IJ SOLR
60.0000 mg | Freq: Every day | INTRAMUSCULAR | Status: DC
  Administered 2021-06-28 – 2021-06-30 (×3): 60 mg via INTRAVENOUS
  Filled 2021-06-28 (×3): qty 2

## 2021-06-28 MED ORDER — SODIUM CHLORIDE 0.9 % IV SOLN
INTRAVENOUS | Status: DC
Start: 2021-06-28 — End: 2021-06-28

## 2021-06-28 MED ORDER — BENAZEPRIL HCL 20 MG PO TABS
20.0000 mg | ORAL_TABLET | Freq: Every morning | ORAL | Status: DC
  Administered 2021-06-28 – 2021-07-04 (×7): 20 mg via ORAL
  Filled 2021-06-28 (×7): qty 1

## 2021-06-28 MED ORDER — ENOXAPARIN SODIUM 40 MG/0.4ML IJ SOSY
40.0000 mg | PREFILLED_SYRINGE | INTRAMUSCULAR | Status: DC
  Administered 2021-06-28 – 2021-07-04 (×7): 40 mg via SUBCUTANEOUS
  Filled 2021-06-28 (×7): qty 0.4

## 2021-06-28 MED ORDER — ONDANSETRON HCL 4 MG PO TABS: 4.0000 mg | ORAL_TABLET | Freq: Four times a day (QID) | ORAL | Status: DC | PRN

## 2021-06-28 MED ORDER — AMLODIPINE BESYLATE 5 MG PO TABS
5.0000 mg | ORAL_TABLET | Freq: Every day | ORAL | Status: DC
  Administered 2021-06-28 – 2021-07-04 (×7): 5 mg via ORAL
  Filled 2021-06-28 (×7): qty 1

## 2021-06-28 NOTE — Evaluation (Signed)
Clinical/Bedside Swallow Evaluation Patient Details  Name: Jocelyn Bauer MRN: [id number] Date of Birth: [birth year]  Today's Date: 06/28/2021 Time: SLP Start Time (ACUTE ONLY): 1233 SLP Stop Time (ACUTE ONLY): 4010 SLP Time Calculation (min) (ACUTE ONLY): 11 min  Past Medical History:  Past Medical History:  Diagnosis Date   Anxiety    HX OF PANIC ATTACKS   Arthritis    RA   Bilateral ankle fractures 07/26/2019   Chronic neck and back pain    Chronic pain    Chronic, continuous use of opioids 07/28/2019   Elbow locking    due to RA- elbow with limited extension   GERD (gastroesophageal reflux disease)    Headache    Hyperlipidemia    Hypertension    Hypothyroidism    Osteoarthritis    Osteoporosis    Pneumonia    Shortness of breath    occas   UTI (urinary tract infection)    Past Surgical History:  Past Surgical History:  Procedure Laterality Date   CATARACT EXTRACTION Bilateral    COLONOSCOPY     ELBOW SURGERY Right    ELBOW SURGERY Left    ESOPHAGOGASTRODUODENOSCOPY     HARDWARE REMOVAL Right 07/13/2015   Procedure:  WITH HARDWARE REMOVAL REPAIR AS NECESSARY;  Surgeon: Roseanne Kaufman, MD;  Location: [facility];  Service: Orthopedics;  Laterality: Right;   HARDWARE REMOVAL Right 01/22/2021   Procedure: HARDWARE REMOVAL;  Surgeon: Altamese Hills and Dales, MD;  Location: [facility];  Service: Orthopedics;  Laterality: Right;   JOINT REPLACEMENT  1998   left hip replacement and bilateral elbow replacements  about 15 yrs ago   ORIF ANKLE FRACTURE Bilateral 07/27/2019   Procedure: OPEN REDUCTION INTERNAL FIXATION (ORIF) ANKLE FRACTURE;  Surgeon: Altamese Bosworth, MD;  Location: [facility];  Service: Orthopedics;  Laterality: Bilateral;   thyroidectomy     TOTAL ELBOW ARTHROPLASTY Right 07/13/2015   Procedure: RIGHT ELBOW REVISION TOTAL ELBOW ARTHROPLASTY ;  Surgeon: Roseanne Kaufman, MD;  Location: [facility];  Service: Orthopedics;  Laterality: Right;   TOTAL HIP ARTHROPLASTY     TOTAL HIP REVISION   03/11/2011   Procedure: TOTAL HIP REVISION;  Surgeon: [name], MD;  Location: [facility];  Service: Orthopedics;  Laterality: Left;   TRICEPS TENDON REPAIR Right 07/13/2015   Procedure:  TRICEP RECONSTRUCTION REPAIR WITH ALLOGRAFT, ULNAR NERVE NEUROLYSIS;  Surgeon: Roseanne Kaufman, MD;  Location: [facility];  Service: Orthopedics;  Laterality: Right;   HPI:  Jocelyn Bauer is a 88 y.o. female presented with SOB, cough.  She was last admitted from 3/11-15 for multifocal PNA, treated with antibiotics and discharged with home O2. She had come from home but was discharged to [facility].  She remained at [facility] for about a month and returned home off O2.  She has done fairly well but started with cough and SOB about 3 days ago.  No fever.  No coughing/choking/sputtering while eating and drinking. CXR 6/22: "Patchy bilateral pulmonary opacity more extensive on the right than  seen previously." Pt with medical history significant of chronic pain; HTN; HLD; hypothyroidism    Assessment / Plan / Recommendation  Clinical Impression  Pt presents with a mild oral dysphagia 2/2 edentulism.  Pt does not have lower denture plate present and exhbited difficulty with mastication of regular solids. With soft solids, oral phase was more efficient.  Pt tolerated all consistencies trialed without clinical s/s of aspiration, save for audible exhalation following swallow induced apnea, including with single straw sips  of thin liquid; however, a concern for prandial aspiration still exists given recent hospitalization for pneumonia in March.  Daughter reports additional cases of peumonia without hospitalization.  This is pt's 5th in the past year.  Pt would benefit from instrumental evaluation to rule out silent aspiration.    Recommend mechanical soft solids with thin liquid pending results of MBS.  SLP Visit Diagnosis: Dysphagia, unspecified (R13.10)    Aspiration Risk  Mild aspiration risk    Diet Recommendation  Dysphagia 3 (Mech soft);Thin liquid   Liquid Administration via: Cup;Straw Medication Administration: Whole meds with puree Supervision: Patient able to self feed Compensations: Slow rate;Small sips/bites Postural Changes: Seated upright at 90 degrees    Other  Recommendations Oral Care Recommendations: Oral care BID    Recommendations for follow up therapy are one component of a multi-disciplinary discharge planning process, led by the attending physician.  Recommendations may be updated based on patient status, additional functional criteria and insurance authorization.  Follow up Recommendations  (TBD pending instrumental)      Assistance Recommended at Discharge  (TBD pending instrumental)  Functional Status Assessment  (TBD pending instrumental)  Frequency and Duration  (TBD pending instrumental)          Prognosis Prognosis for Safe Diet Advancement:  (TBD pending instrumental)      Swallow Study   General Date of Onset: 06/28/21 HPI: Jocelyn Bauer is a 88 y.o. female presented with SOB, cough.  She was last admitted from 3/11-15 for multifocal PNA, treated with antibiotics and discharged with home O2. She had come from home but was discharged to [facility].  She remained at [facility] for about a month and returned home off O2.  She has done fairly well but started with cough and SOB about 3 days ago.  No fever.  No coughing/choking/sputtering while eating and drinking. CXR 6/22: "Patchy bilateral pulmonary opacity more extensive on the right than  seen previously." Pt with medical history significant of chronic pain; HTN; HLD; hypothyroidism Type of Study: Bedside Swallow Evaluation Previous Swallow Assessment: None Diet Prior to this Study: Regular;Thin liquids Temperature Spikes Noted: No Respiratory Status: Nasal cannula History of Recent Intubation: No Behavior/Cognition: Alert;Cooperative;Pleasant mood Oral Cavity Assessment: Within Functional Limits Oral  Cavity - Dentition: Dentures, top (Bottom dentures unavailable) Vision: Functional for self-feeding Self-Feeding Abilities: Able to feed self Patient Positioning: Upright in bed Baseline Vocal Quality: Normal Volitional Swallow: Unable to elicit    Oral/Motor/Sensory Function Overall Oral Motor/Sensory Function: Mild impairment Facial ROM: Within Functional Limits Facial Symmetry: Within Functional Limits Lingual ROM: Within Functional Limits Lingual Symmetry:  (?protrusion to R) Lingual Strength: Reduced Velum: Within Functional Limits Mandible: Within Functional Limits   Ice Chips Ice chips: Not tested   Thin Liquid Thin Liquid: Within functional limits Presentation: Straw    Nectar Thick Nectar Thick Liquid: Not tested   Honey Thick Honey Thick Liquid: Not tested   Puree Puree: Within functional limits Presentation: Spoon   Solid     Solid: Impaired Presentation: Spoon;Self Fed Oral Phase Functional Implications: Impaired mastication      Celedonio Savage, [place], [place] Office: [phone] 06/28/2021,1:12 PM

## 2021-06-28 NOTE — ED Triage Notes (Signed)
Pt BIB [facility] due to [facility] and a wet cough for 2 days.  Hx of pneumonia.  Pt currently on 8L of oxygen through [facility] due to pt having SpO2 70% room air.  [facility] gave 125 solu-medrol, 10 albuterol & 0.5 atrovent. VS BP 137/80, CBG 207, 20g on L forearm.

## 2021-06-28 NOTE — Sepsis Progress Note (Signed)
Notified bedside nurse of need to draw repeat lactic acid. 

## 2021-06-28 NOTE — ED Notes (Signed)
Pt on bedpan for BM 

## 2021-06-28 NOTE — H&P (Addendum)
History and Physical    Patient: Jocelyn Bauer DOB: 1928-03-30 DOA: 06/28/2021 DOS: the patient was seen and examined on 06/28/2021 PCP: [name], MD  Patient coming from: Home - lives with daughter; NOK: Daughter, [name], [phone]   Chief Complaint: SOB, cough  HPI: Jocelyn Bauer is a 88 y.o. female with medical history significant of chronic pain; HTN; HLD; hypothyroidism; and recent (01/22/21) admission for infected R leg hardware s/p removal that was treated with linezolid and cipro (no longer taking as of mid-February) presenting with SOB, cough.  She was last admitted from 3/11-15 for multifocal PNA, treated with antibiotics and discharged with home O2. She had come from home but was discharged to [facility].  She remained at [facility] for about a month and returned home off O2.  She has done fairly well but started with cough and SOB about 3 days ago.  No fever.  No coughing/choking/sputtering while eating and drinking.      ER Course:  Carryover, per Dr. Myna Hidalgo:  88 yr old lady with HTN, RA, chronic pain, and chronic osteomyelitis of right tibia presenting with 2 days of SOB and cough. She is afebrile, slightly tachycardic, and hypoxic, requiring 8 Lpm supplemental O2 with EMS and now on 14 Lpm in ED. She was given Solu-Medrol and nebs with EMS pta. ED workup notable for lactate 2.2 and CXR concerning for bilateral PNA. Covid test pending. Blood cultures were collected and she was treated with Rocephin and azithromycin.      Review of Systems: As mentioned in the history of present illness. All other systems reviewed and are negative. Past Medical History:  Diagnosis Date   Anxiety    HX OF PANIC ATTACKS   Arthritis    RA   Bilateral ankle fractures 07/26/2019   Chronic neck and back pain    Chronic pain    Chronic, continuous use of opioids 07/28/2019   Elbow locking    due to RA- elbow with limited extension   GERD (gastroesophageal reflux  disease)    Headache    Hyperlipidemia    Hypertension    Hypothyroidism    Osteoarthritis    Osteoporosis    Pneumonia    Shortness of breath    occas   UTI (urinary tract infection)    Past Surgical History:  Procedure Laterality Date   CATARACT EXTRACTION Bilateral    COLONOSCOPY     ELBOW SURGERY Right    ELBOW SURGERY Left    ESOPHAGOGASTRODUODENOSCOPY     HARDWARE REMOVAL Right 07/13/2015   Procedure:  WITH HARDWARE REMOVAL REPAIR AS NECESSARY;  Surgeon: Roseanne Kaufman, MD;  Location: [place];  Service: Orthopedics;  Laterality: Right;   HARDWARE REMOVAL Right 01/22/2021   Procedure: HARDWARE REMOVAL;  Surgeon: Altamese Hartley, MD;  Location: [place];  Service: Orthopedics;  Laterality: Right;   JOINT REPLACEMENT  1998   left hip replacement and bilateral elbow replacements  about 15 yrs ago   ORIF ANKLE FRACTURE Bilateral 07/27/2019   Procedure: OPEN REDUCTION INTERNAL FIXATION (ORIF) ANKLE FRACTURE;  Surgeon: Altamese Versailles, MD;  Location: [place];  Service: Orthopedics;  Laterality: Bilateral;   thyroidectomy     TOTAL ELBOW ARTHROPLASTY Right 07/13/2015   Procedure: RIGHT ELBOW REVISION TOTAL ELBOW ARTHROPLASTY ;  Surgeon: Roseanne Kaufman, MD;  Location: [place];  Service: Orthopedics;  Laterality: Right;   TOTAL HIP ARTHROPLASTY     TOTAL HIP REVISION  03/11/2011   Procedure: TOTAL HIP REVISION;  Surgeon: [name], MD;  Location: [facility];  Service: Orthopedics;  Laterality: Left;   TRICEPS TENDON REPAIR Right 07/13/2015   Procedure:  TRICEP RECONSTRUCTION REPAIR WITH ALLOGRAFT, ULNAR NERVE NEUROLYSIS;  Surgeon: Roseanne Kaufman, MD;  Location: [place];  Service: Orthopedics;  Laterality: Right;   Social History:  reports that she has quit smoking. Her smoking use included cigarettes. She has never used smokeless tobacco. She reports that she does not drink alcohol and does not use drugs.  Allergies  Allergen Reactions   Contrast Media [Iodinated Contrast Media] Other (See Comments)     Itching.( 13 hr pre medication regimen worked fine. )   Tdap [Tetanus-Diphth-Acell Pertussis] Hives   Streptococcus (Diplococcus) Pneumoniae [Streptococci] Other (See Comments)    Shortness of breath    Family History  Problem Relation Age of Onset   Heart attack Mother    Prostate cancer Father    Lung disease Brother    Heart attack Sister    COPD Sister    Cancer Sister        She does not know which type of cancer    Prior to Admission medications   Medication Sig Start Date End Date Taking? Authorizing Provider  albuterol (PROVENTIL) (2.5 MG/3ML) 0.083% nebulizer solution Take 3 mLs (2.5 mg total) by nebulization every 2 (two) hours as needed for wheezing. 03/21/21  Yes Ghimire, Henreitta Leber, MD  amLODipine (NORVASC) 5 MG tablet Take 5 mg by mouth daily.   Yes [provider]  aspirin EC 81 MG tablet Take 1 tablet (81 mg total) by mouth daily. Swallow whole. 03/21/21  Yes Ghimire, Henreitta Leber, MD  benazepril (LOTENSIN) 20 MG tablet Take 20 mg by mouth every morning.   Yes [provider]  bisacodyl (DULCOLAX) 5 MG EC tablet Take 1 tablet (5 mg total) by mouth daily as needed for moderate constipation. 07/29/19  Yes Allie Bossier, MD  cholecalciferol (VITAMIN D3) 25 MCG (1000 UNIT) tablet Take 3,000 Units by mouth daily.   Yes [provider]  denosumab (PROLIA) 60 MG/ML SOSY injection Inject 60 mg into the skin every 6 (six) months.   Yes [provider]  docusate sodium (COLACE) 100 MG capsule Take 2 capsules (200 mg total) by mouth 2 (two) times daily. Patient taking differently: Take 200 mg by mouth daily as needed for moderate constipation. 07/29/19  Yes Allie Bossier, MD  fentaNYL (DURAGESIC) 50 MCG/HR Place 1 patch onto the skin every 3 (three) days. 03/21/21  Yes Ghimire, Henreitta Leber, MD  folic acid (FOLVITE) 1 MG tablet Take 1 mg by mouth daily.   Yes [provider]  guaiFENesin (MUCINEX) 600 MG 12 hr tablet Take 1 tablet (600 mg  total) by mouth 2 (two) times daily as needed for cough. 03/21/21  Yes Ghimire, Henreitta Leber, MD  levothyroxine (SYNTHROID) 125 MCG tablet Take 125 mcg by mouth daily before breakfast.   Yes [provider]  LORazepam (ATIVAN) 0.5 MG tablet Take 0.5 mg by mouth 2 (two) times daily as needed for anxiety.   Yes [provider]  methotrexate (RHEUMATREX) 2.5 MG tablet Take 15 mg by mouth every Sunday. 06/06/21  Yes [provider]  Multiple Vitamins-Minerals (MULTIVITAMIN GUMMIES ADULT) CHEW Chew 1 tablet by mouth daily.   Yes [provider]  omeprazole (PRILOSEC) 20 MG capsule Take 20 mg by mouth daily as needed (heartburn). 04/14/21  Yes [provider]  oxyCODONE-acetaminophen (PERCOCET) 10-325 MG tablet Take 1 tablet  by mouth every 6 (six) hours as needed for pain. 03/21/21  Yes Ghimire, Henreitta Leber, MD  polyethylene glycol (MIRALAX / GLYCOLAX) 17 g packet Take 17 g by mouth daily as needed for mild constipation. 03/21/21  Yes Ghimire, Henreitta Leber, MD  predniSONE (DELTASONE) 5 MG tablet 30 mg p.o. daily for 2 days, then 20 mg p.o. daily for 2 days, then 10 mg p.o. daily for 2 days, and then resume 5 mg p.o. daily dosing Patient taking differently: Take 5 mg by mouth daily with breakfast. Continuously 03/21/21  Yes Ghimire, Henreitta Leber, MD  sertraline (ZOLOFT) 50 MG tablet Take 50 mg by mouth daily. 06/21/21  Yes [provider]  vitamin B-12 (CYANOCOBALAMIN) 1000 MCG tablet Take 1,000 mcg by mouth daily.   Yes [provider]  acetaminophen (TYLENOL) 325 MG tablet Take 2 tablets (650 mg total) by mouth 3 (three) times daily. Patient not taking: Reported on 06/28/2021 07/29/19   Allie Bossier, MD  amLODipine (NORVASC) 10 MG tablet Take 1 tablet (10 mg total) by mouth every morning. Patient not taking: Reported on 06/28/2021 03/21/21   Jonetta Osgood, MD    Physical Exam: Vitals:   06/28/21 0642 06/28/21 0645 06/28/21 0700 06/28/21 0715  BP:  (!)  142/79 (!) 139/91 128/66  Pulse:  (!) 106 (!) 103 69  Resp:  (!) 21 (!) 23 (!) 23  Temp:      TempSrc:      SpO2: 93% 93% 92% 94%  Weight:      Height:       General:  Appears calm and comfortable and is in NAD; appears frail Eyes:  PERRL, EOMI, normal lids, iris ENT:  grossly normal hearing, lips & tongue, mmm; edentulous Neck:  no LAD, masses or thyromegaly Cardiovascular:  Irregularly irregular, no m/r/g. No LE edema.  Respiratory:   Scattered rhonchi.  Mildly increased respiratory effort on 10L HFNC O2. Abdomen:  soft, NT, ND Skin:  no rash on limited exam Musculoskeletal:  grossly normal tone BUE/BLE, good ROM, no bony abnormality; hand deformities c/w RA Psychiatric:  blunted mood and affect, speech fluent and appropriate, AOx3 Neurologic:  CN 2-12 grossly intact, moves all extremities in coordinated fashion   Radiological Exams on Admission: Independently reviewed - see discussion in A/P where applicable  DG Chest Port 1 View  Result Date: 06/28/2021 CLINICAL DATA:  Shortness of breath EXAM: PORTABLE CHEST 1 VIEW COMPARISON:  03/17/2021 FINDINGS: Patchy bilateral pulmonary opacity more extensive on the right than seen previously. Mild cardiomegaly. Extensive atheromatous calcification of the aorta. Thin lines over the bilateral paramedian chest are likely external to the patient given their extent. No suspected pneumomediastinum. IMPRESSION: Patchy bilateral pneumonia. Electronically Signed   By: Jorje Guild M.D.   On: 06/28/2021 05:48    EKG: Independently reviewed.  Irregularly irregular with rate 108; repolarization abnormality with concern for severe global ischemia   Labs on Admission: I have personally reviewed the available labs and imaging studies at the time of the admission.  Pertinent labs:    K+ 3.1 Glucose 204 Albumin 2.5 GFR 56 - stable Lactate 2.2 WBC 9.7 Hgb 9.2 INR 1.1 COVID negative   Assessment and Plan: Principal Problem:   Sepsis due to  pneumonia [facility]) Active Problems:   Hypertension   Hypothyroidism   Hyperlipidemia   Anxiety   Chronic, continuous use of opioids   RA (rheumatoid arthritis) ([place])   DNR (do not resuscitate)    Multifocal pneumonia with sepsis -  Patient presenting with productive cough, mildly decreased oxygen saturation, and multifocal infiltrates on chest x-ray -O2 sats are documented as 93% but she was consistently 90-92% on 10L HFNC while I was in the room -SIRS criteria include tachycardia, tachypnea; she has elevated lactate -This appears to be most likely community-acquired pneumonia; however, was hospitalized roughly 90 days ago and then went to SNF rehab for 1 month and so is at risk for drug-resistant organisms. -Aspiration is also a consideration, although patient/family deny significant concerns  -COVID-19 negative. -Gram stain, sputum and blood cultures are pending -Will order lower respiratory tract procalcitonin level.   >0.5 indicates infection and >>0.5 indicates more serious disease.  As the procalcitonin level normalizes, it will be reasonable to consider de-escalation of antibiotic coverage.  The sensitivity of procalcitonin is variable and should not be used alone to guide treatment. -CURB-65 score is 1 but she is on HFNC - will admit the patient to progressive -Pneumonia Severity Index (PSI) is Class 4, 9% mortality. -Corticosteroids were given in stress dosing since she is on daily prednisone -Will start Azithromycin 500 mg IV daily and Rocephin and check MRSA PCR -Additional complicating factors include: hypoxia (not documented but sats are marginal despite 10L HFNC O2), immunocompromise -LR @ 75cc/hr -Fever control -Repeat CBC in am -Will add albuterol PRN -Will add Mucinex for cough   RA (rheumatoid arthritis) ([place]) -She is on Prolia, methotrexate, prednisone and so is immunocompromised -Needs stress-dosed steroids -Otherwise hold these medications   Infected hardware in  right leg ([place]) -Completed treatment <1 month ago -No apparent infection at this time on the R leg -She does have a superficial ulceration on her L metatarsal head and I have encouraged family to closely monitor this   Chronic, continuous use of opioids -I have reviewed this patient in the [place] Controlled Substances Reporting System.  She is usually receiving medications from only one provider and appears to be taking them as prescribed. -She is at extraordinarily high risk of opioid misuse, diversion, or overdose. -Would be judicious in providing additional sedating medications and consider tapering to some extent as an outpatient. -For now, will order home meds (Duragesic, Percocet) with no additional opiates   Hypothyroidism -Continue Synthroid   Hypertension -Continue Norvasc, Lotensin  Mood d/o -Continue Zoloft, Ativan  DNR -I have discussed code status with the patient and her granddaughter and  they are in agreement that the patient would not desire resuscitation and would prefer to die a natural death should that situation arise. -She will need a gold out of facility DNR form at the time of discharge     Advance Care Planning:   Code Status: DNR   Consults: RT; PT/OT; TOC team; SLP  DVT Prophylaxis: Lovenox  Family Communication: Daughter was present throughout evaluation  Severity of Illness: The appropriate patient status for this patient is INPATIENT. Inpatient status is judged to be reasonable and necessary in order to provide the required intensity of service to ensure the patient's safety. The patient's presenting symptoms, physical exam findings, and initial radiographic and laboratory data in the context of their chronic comorbidities is felt to place them at high risk for further clinical deterioration. Furthermore, it is not anticipated that the patient will be medically stable for discharge from the hospital within 2 midnights of admission.   * I certify that at  the point of admission it is my clinical judgment that the patient will require inpatient hospital care spanning beyond 2 midnights from the point  of admission due to high intensity of service, high risk for further deterioration and high frequency of surveillance required.*  Author: Karmen Bongo, MD 06/28/2021 8:34 AM  For on call review [email or web address].

## 2021-06-28 NOTE — ED Notes (Signed)
Critical Lactic Acid 2.2.  MD notified.  No new orders at this time.

## 2021-06-28 NOTE — Plan of Care (Signed)
  Problem: Activity: Goal: Ability to tolerate increased activity will improve Outcome: Progressing   Problem: Clinical Measurements: Goal: Ability to maintain a body temperature in the normal range will improve Outcome: Progressing   Problem: Respiratory: Goal: Ability to maintain adequate ventilation will improve Outcome: Progressing   

## 2021-06-29 ENCOUNTER — Inpatient Hospital Stay (HOSPITAL_COMMUNITY): Payer: Medicare Other

## 2021-06-29 DIAGNOSIS — E039 Hypothyroidism, unspecified: Secondary | ICD-10-CM

## 2021-06-29 DIAGNOSIS — F419 Anxiety disorder, unspecified: Secondary | ICD-10-CM

## 2021-06-29 DIAGNOSIS — J189 Pneumonia, unspecified organism: Secondary | ICD-10-CM | POA: Diagnosis not present

## 2021-06-29 DIAGNOSIS — M069 Rheumatoid arthritis, unspecified: Secondary | ICD-10-CM

## 2021-06-29 DIAGNOSIS — F1190 Opioid use, unspecified, uncomplicated: Secondary | ICD-10-CM | POA: Diagnosis not present

## 2021-06-29 DIAGNOSIS — E785 Hyperlipidemia, unspecified: Secondary | ICD-10-CM

## 2021-06-29 DIAGNOSIS — J9601 Acute respiratory failure with hypoxia: Secondary | ICD-10-CM

## 2021-06-29 DIAGNOSIS — I10 Essential (primary) hypertension: Secondary | ICD-10-CM

## 2021-06-29 LAB — CBC
HCT: 29.8 % — ABNORMAL LOW (ref 36.0–46.0)
Hemoglobin: 9.4 g/dL — ABNORMAL LOW (ref 12.0–15.0)
MCH: 28.3 pg (ref 26.0–34.0)
MCHC: 31.5 g/dL (ref 30.0–36.0)
MCV: 89.8 fL (ref 80.0–100.0)
Platelets: 366 10*3/uL (ref 150–400)
RBC: 3.32 MIL/uL — ABNORMAL LOW (ref 3.87–5.11)
RDW: 17.2 % — ABNORMAL HIGH (ref 11.5–15.5)
WBC: 9.9 10*3/uL (ref 4.0–10.5)
nRBC: 0 % (ref 0.0–0.2)

## 2021-06-29 LAB — BASIC METABOLIC PANEL
Anion gap: 11 (ref 5–15)
BUN: 14 mg/dL (ref 8–23)
CO2: 26 mmol/L (ref 22–32)
Calcium: 8.5 mg/dL — ABNORMAL LOW (ref 8.9–10.3)
Chloride: 103 mmol/L (ref 98–111)
Creatinine, Ser: 0.76 mg/dL (ref 0.44–1.00)
GFR, Estimated: >60 mL/min (ref >60)
Glucose, Bld: 162 mg/dL — ABNORMAL HIGH (ref 70–99)
Potassium: 3.2 mmol/L — ABNORMAL LOW (ref 3.5–5.1)
Sodium: 140 mmol/L (ref 135–145)

## 2021-06-29 LAB — RESPIRATORY PANEL BY PCR

## 2021-06-29 LAB — LEGIONELLA PNEUMOPHILA SEROGP 1 UR AG: L. pneumophila Serogp 1 Ur Ag: NEGATIVE

## 2021-06-29 LAB — PROCALCITONIN: Procalcitonin: 0.2 ng/mL

## 2021-06-29 MED ORDER — POTASSIUM CHLORIDE 20 MEQ PO PACK
40.0000 meq | PACK | Freq: Every day | ORAL | Status: DC
  Administered 2021-06-29 – 2021-07-04 (×6): 40 meq via ORAL
  Filled 2021-06-29 (×6): qty 2

## 2021-06-29 NOTE — NC FL2 (Signed)
[place] MEDICAID FL2 LEVEL OF CARE SCREENING TOOL     IDENTIFICATION  Patient Name: Jocelyn Bauer Birthdate: [birth year] Sex: female Admission Date (Current Location): 06/28/2021  [facility] and [place] Number:  Producer, television/film/video and Address:  [facility], [address] [place], [place] [address]      Provider Number: [id number]  Attending Physician Name and Address:  Narda Bonds, MD  Relative Name and Phone Number:  Allene Pyo (Daughter)   [phone] (Mobile)    Current Level of Care:   Recommended Level of Care: Skilled Nursing Facility Prior Approval Number:    Date Approved/Denied:   PASRR Number: [id number]  Discharge Plan: SNF    Current Diagnoses: Patient Active Problem List   Diagnosis Date Noted   Sepsis due to pneumonia (HCC) 06/28/2021   Palliative care encounter 06/25/2021   RA (rheumatoid arthritis) (HCC) 03/17/2021   DNR (do not resuscitate) 03/17/2021   Infected hardware in right leg [facility])    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)    Contusion of left elbow 06/14/2020   Closed trimalleolar fracture of right ankle    Chronic, continuous use of opioids 07/28/2019   Right elbow pain 07/14/2015   Presence of right artificial elbow joint 07/13/2015   Hypertension    Hypothyroidism    Hyperlipidemia    GERD (gastroesophageal reflux disease)    Anxiety    Osteoporosis    Chronic neck and back pain    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    Orientation RESPIRATION BLADDER Height & Weight     Self, Time, Situation, Place  O2 (10LNC) Continent Weight: 143 lb 15.4 oz (65.3 kg) Height:  5\' 2"  (157.5 cm)  BEHAVIORAL SYMPTOMS/MOOD NEUROLOGICAL BOWEL NUTRITION STATUS      Continent Diet (See d/c summary)  AMBULATORY STATUS COMMUNICATION OF NEEDS Skin   Extensive Assist Verbally Normal                       Personal Care Assistance Level of Assistance  Bathing, Dressing, Feeding Bathing  Assistance: Limited assistance Feeding assistance: Independent Dressing Assistance: Limited assistance     Functional Limitations Info  Sight, Hearing, Speech Sight Info: Adequate Hearing Info: Impaired Speech Info: Adequate    SPECIAL CARE FACTORS FREQUENCY  OT (By licensed OT), PT (By licensed PT)     PT Frequency: 5x/week OT Frequency: 5x/week            Contractures Contractures Info: Not present    Additional Factors Info  Code Status, Allergies Code Status Info: DNR Allergies Info: Contrast Media (iodinated Contrast Media), Tdap (tetanus-diphth-acell Pertussis), Streptococcus (diplococcus) Pneumoniae (streptococci)           Current Medications (06/29/2021):  This is the current hospital active medication list Current Facility-Administered Medications  Medication Dose Route Frequency Provider Last Rate Last Admin   0.9 %  sodium chloride infusion   Intravenous Continuous Jonah Blue, MD 75 mL/hr at 06/28/21 1124 New Bag at 06/28/21 1124   acetaminophen (TYLENOL) tablet 650 mg  650 mg Oral Q6H PRN Jonah Blue, MD       Or   acetaminophen (TYLENOL) suppository 650 mg  650 mg Rectal Q6H PRN Jonah Blue, MD       albuterol (PROVENTIL) (2.5 MG/3ML) 0.083% nebulizer solution 2.5 mg  2.5 mg Nebulization Q2H PRN Jonah Blue, MD       amLODipine (NORVASC) tablet 5 mg  5 mg  Oral Daily Jonah Blue, MD   5 mg at 06/29/21 1119   aspirin EC tablet 81 mg  81 mg Oral Daily Jonah Blue, MD   81 mg at 06/29/21 1118   azithromycin (ZITHROMAX) 500 mg in sodium chloride 0.9 % 250 mL IVPB  500 mg Intravenous Q24H Jonah Blue, MD   Stopped at 06/29/21 1030   benazepril (LOTENSIN) tablet 20 mg  20 mg Oral q morning Jonah Blue, MD   20 mg at 06/29/21 1118   bisacodyl (DULCOLAX) EC tablet 5 mg  5 mg Oral Daily PRN Jonah Blue, MD       cefTRIAXone (ROCEPHIN) 2 g in sodium chloride 0.9 % 100 mL IVPB  2 g Intravenous Q24H Jonah Blue, MD   Stopped at  06/29/21 0715   docusate sodium (COLACE) capsule 100 mg  100 mg Oral BID Jonah Blue, MD   100 mg at 06/29/21 1118   enoxaparin (LOVENOX) injection 40 mg  40 mg Subcutaneous Q24H Jonah Blue, MD   40 mg at 06/29/21 0844   fentaNYL (DURAGESIC) 50 MCG/HR 1 patch  1 patch Transdermal Vickie Epley, MD   1 patch at 06/28/21 1803   guaiFENesin (MUCINEX) 12 hr tablet 600 mg  600 mg Oral BID PRN Jonah Blue, MD       hydrALAZINE (APRESOLINE) injection 5 mg  5 mg Intravenous Q4H PRN Jonah Blue, MD       levothyroxine (SYNTHROID) tablet 125 mcg  125 mcg Oral QAC breakfast Jonah Blue, MD   125 mcg at 06/29/21 0507   LORazepam (ATIVAN) tablet 0.5 mg  0.5 mg Oral BID PRN Jonah Blue, MD   0.5 mg at 06/28/21 2105   methylPREDNISolone sodium succinate (SOLU-MEDROL) 125 mg/2 mL injection 60 mg  60 mg Intravenous Daily Jonah Blue, MD   60 mg at 06/29/21 1119   ondansetron (ZOFRAN) tablet 4 mg  4 mg Oral Q6H PRN Jonah Blue, MD       Or   ondansetron [facility]) injection 4 mg  4 mg Intravenous Q6H PRN Jonah Blue, MD       oxyCODONE-acetaminophen (PERCOCET/ROXICET) 5-325 MG per tablet 1 tablet  1 tablet Oral Q6H PRN Jonah Blue, MD   1 tablet at 06/29/21 1235   And   oxyCODONE (Oxy IR/ROXICODONE) immediate release tablet 5 mg  5 mg Oral Q6H PRN Jonah Blue, MD   5 mg at 06/29/21 1235   polyethylene glycol (MIRALAX / GLYCOLAX) packet 17 g  17 g Oral Daily PRN Jonah Blue, MD       sertraline (ZOLOFT) tablet 50 mg  50 mg Oral Daily Jonah Blue, MD   50 mg at 06/29/21 1118   sodium chloride flush (NS) 0.9 % injection 3 mL  3 mL Intravenous Steva Colder, MD   3 mL at 06/29/21 1121     Discharge Medications: Please see discharge summary for a list of discharge medications.  Relevant Imaging Results:  Relevant Lab Results:   Additional Information SS#:[id number]  Erin Sons, LCSW

## 2021-06-30 DIAGNOSIS — J189 Pneumonia, unspecified organism: Secondary | ICD-10-CM | POA: Diagnosis not present

## 2021-06-30 DIAGNOSIS — F419 Anxiety disorder, unspecified: Secondary | ICD-10-CM | POA: Diagnosis not present

## 2021-06-30 DIAGNOSIS — F1190 Opioid use, unspecified, uncomplicated: Secondary | ICD-10-CM | POA: Diagnosis not present

## 2021-06-30 DIAGNOSIS — E785 Hyperlipidemia, unspecified: Secondary | ICD-10-CM | POA: Diagnosis not present

## 2021-06-30 LAB — CBC
HCT: 27.4 % — ABNORMAL LOW (ref 36.0–46.0)
Hemoglobin: 8.6 g/dL — ABNORMAL LOW (ref 12.0–15.0)
MCH: 28.1 pg (ref 26.0–34.0)
MCHC: 31.4 g/dL (ref 30.0–36.0)
MCV: 89.5 fL (ref 80.0–100.0)
Platelets: 319 10*3/uL (ref 150–400)
RBC: 3.06 MIL/uL — ABNORMAL LOW (ref 3.87–5.11)
RDW: 17.2 % — ABNORMAL HIGH (ref 11.5–15.5)
WBC: 10.5 10*3/uL (ref 4.0–10.5)
nRBC: 0 % (ref 0.0–0.2)

## 2021-06-30 LAB — PROCALCITONIN: Procalcitonin: 0.11 ng/mL

## 2021-06-30 LAB — BASIC METABOLIC PANEL
Anion gap: 10 (ref 5–15)
BUN: 18 mg/dL (ref 8–23)
CO2: 27 mmol/L (ref 22–32)
Calcium: 8.2 mg/dL — ABNORMAL LOW (ref 8.9–10.3)
Chloride: 103 mmol/L (ref 98–111)
Creatinine, Ser: 0.7 mg/dL (ref 0.44–1.00)
GFR, Estimated: >60 mL/min (ref >60)
Glucose, Bld: 151 mg/dL — ABNORMAL HIGH (ref 70–99)
Potassium: 3.8 mmol/L (ref 3.5–5.1)
Sodium: 140 mmol/L (ref 135–145)

## 2021-06-30 MED ORDER — PREDNISONE 5 MG PO TABS
5.0000 mg | ORAL_TABLET | Freq: Every day | ORAL | Status: DC
  Administered 2021-07-01 – 2021-07-04 (×4): 5 mg via ORAL
  Filled 2021-06-30 (×4): qty 1

## 2021-06-30 MED ORDER — LORATADINE 10 MG PO TABS
10.0000 mg | ORAL_TABLET | Freq: Every day | ORAL | Status: DC
  Administered 2021-06-30 – 2021-07-04 (×5): 10 mg via ORAL
  Filled 2021-06-30 (×5): qty 1

## 2021-06-30 MED ORDER — FUROSEMIDE 10 MG/ML IJ SOLN
20.0000 mg | Freq: Once | INTRAMUSCULAR | Status: AC
  Administered 2021-06-30: 20 mg via INTRAVENOUS
  Filled 2021-06-30: qty 2

## 2021-06-30 NOTE — Evaluation (Signed)
Occupational Therapy Evaluation Patient Details Name: Jocelyn Bauer MRN: [id number] DOB: 08-14-1928 Today's Date: 06/30/2021   History of Present Illness 88 y.o. female presents to [facility] hospital on 06/28/2021 with SOB and cough. CXR concerning for bilateral PNA. PMH includes anxiety, RA, chronic pain, HLD, HTN, UTI.   Clinical Impression   Pt presents with decline in function and safety with ADLs and ADL mobility with impaired strength, balance and endurance. PTA pt lived at home with family and reports that requires assist with LB ADLs, IADLs and uses a cane for mobility. Pt currently requires min A to sit EOB, mod - max A with LD ADLs, min A with UB ADLs, min a for standing and SPTs, mod A with toileting tasks. Pt with hx of B UE AROM impairments/RA. Pt would benefit from acute OT services to address impairments to maximize level of function and safety     Recommendations for follow up therapy are one component of a multi-disciplinary discharge planning process, led by the attending physician.  Recommendations may be updated based on patient status, additional functional criteria and insurance authorization.   Follow Up Recommendations  Skilled nursing-short term rehab (<3 hours/day)    Assistance Recommended at Discharge Frequent or constant Supervision/Assistance  Patient can return home with the following A lot of help with bathing/dressing/bathroom;A little help with walking and/or transfers;Assistance with cooking/housework;Direct supervision/assist for medications management;Assist for transportation;Help with stairs or ramp for entrance    Functional Status Assessment  Patient has had a recent decline in their functional status and demonstrates the ability to make significant improvements in function in a reasonable and predictable amount of time.  Equipment Recommendations  None recommended by OT    Recommendations for Other Services       Precautions / Restrictions  Precautions Precautions: Fall Precaution Comments: Pt has anxiety and fear of falling Restrictions Weight Bearing Restrictions: No      Mobility Bed Mobility Overal bed mobility: Needs Assistance Bed Mobility: Supine to Sit, Sit to Supine     Supine to sit: Min assist, HOB elevated Sit to supine: Min assist   General bed mobility comments: min A to elevate trunk, min A with LEs back onto bed    Transfers Overall transfer level: Needs assistance Equipment used: Rolling walker (2 wheels) Transfers: Sit to/from Stand, Bed to chair/wheelchair/BSC Sit to Stand: Min assist Stand pivot transfers: Min assist                Balance Overall balance assessment: Needs assistance Sitting-balance support: Feet supported, Bilateral upper extremity supported Sitting balance-Leahy Scale: Fair     Standing balance support: Bilateral upper extremity supported, During functional activity Standing balance-Leahy Scale: Poor                             ADL either performed or assessed with clinical judgement   ADL Overall ADL's : Needs assistance/impaired Eating/Feeding: Set up;Sitting Eating/Feeding Details (indicate cue type and reason): RA in B hands Grooming: Wash/dry face;Wash/dry hands;Min guard;Sitting   Upper Body Bathing: Minimal assistance;Sitting   Lower Body Bathing: Sitting/lateral leans;Moderate assistance Lower Body Bathing Details (indicate cue type and reason): simulated seated EOB Upper Body Dressing : Minimal assistance;Sitting   Lower Body Dressing: Maximal assistance   Toilet Transfer: Minimal assistance;Cueing for safety;Stand-pivot;Rolling walker (2 wheels);BSC/3in1   Toileting- Clothing Manipulation and Hygiene: Moderate assistance;Sit to/from stand       Functional mobility during ADLs: Minimal assistance;Cueing for  safety;Rolling walker (2 wheels)       Vision Baseline Vision/History: 1 Wears glasses Patient Visual Report: No change  from baseline       Perception     Praxis      Pertinent Vitals/Pain Pain Assessment Pain Assessment: Faces Faces Pain Scale: Hurts a little bit Pain Location: headache Pain Descriptors / Indicators: Aching Pain Intervention(s): Monitored during session, Repositioned     Hand Dominance Right   Extremity/Trunk Assessment Upper Extremity Assessment Upper Extremity Assessment: Generalized weakness;RUE deficits/detail;LUE deficits/detail RUE Deficits / Details: RA in B hands LUE Deficits / Details: RA in B hands, elbow joint with fuxed flexion contracture 0-90 degrees   Lower Extremity Assessment Lower Extremity Assessment: Defer to PT evaluation   Cervical / Trunk Assessment Cervical / Trunk Assessment: Kyphotic   Communication Communication Communication: No difficulties   Cognition Arousal/Alertness: Awake/alert Behavior During Therapy: WFL for tasks assessed/performed Overall Cognitive Status: Within Functional Limits for tasks assessed                                       General Comments       Exercises     Shoulder Instructions      Home Living Family/patient expects to be discharged to:: Private residence Living Arrangements: Children Available Help at Discharge: Family Type of Home: House Home Access: Stairs to enter;Ramped entrance Entrance Stairs-Number of Steps: 4 Entrance Stairs-Rails: Can reach both       Bathroom Shower/Tub: Chief Strategy Officer: Standard Bathroom Accessibility: Yes   Home Equipment: Agricultural consultant (2 wheels);Cane - single point;Wheelchair - manual;BSC/3in1;Shower seat          Prior Functioning/Environment Prior Level of Function : Needs assist       Physical Assist : Mobility (physical);ADLs (physical)   ADLs (physical): Bathing;Dressing;IADLs Mobility Comments: Pt reports she primarily uses a SPC for ambulation but intermittently uses a RW or wheelchair ADLs Comments: assist with  LB ADLs per pt        OT Problem List: Decreased strength;Impaired balance (sitting and/or standing);Pain;Decreased coordination;Decreased range of motion;Decreased activity tolerance;Impaired UE functional use;Decreased knowledge of use of DME or AE      OT Treatment/Interventions: Self-care/ADL training;DME and/or AE instruction;Therapeutic activities;Balance training;Therapeutic exercise;Neuromuscular education;Patient/family education    OT Goals(Current goals can be found in the care plan section) Acute Rehab OT Goals Patient Stated Goal: get well and go home OT Goal Formulation: With patient Time For Goal Achievement: 07/14/21 Potential to Achieve Goals: Good ADL Goals Pt Will Perform Grooming: with min guard assist;with supervision;standing Pt Will Perform Upper Body Bathing: with min guard assist;with supervision;sitting Pt Will Perform Lower Body Bathing: with min assist;sitting/lateral leans Pt Will Perform Upper Body Dressing: with min guard assist;with supervision;sitting Pt Will Transfer to Toilet: with min guard assist;with supervision;stand pivot transfer;ambulating;bedside commode;grab bars Pt Will Perform Toileting - Clothing Manipulation and hygiene: with min assist;sit to/from stand  OT Frequency: Min 2X/week    Co-evaluation              AM-PAC OT "6 Clicks" Daily Activity     Outcome Measure Help from another person eating meals?: A Little Help from another person taking care of personal grooming?: A Little Help from another person toileting, which includes using toliet, bedpan, or urinal?: A Lot Help from another person bathing (including washing, rinsing, drying)?: A Lot Help from another person to put  on and taking off regular upper body clothing?: A Little Help from another person to put on and taking off regular lower body clothing?: A Lot 6 Click Score: 15   End of Session Equipment Utilized During Treatment: Gait belt;Rolling walker (2  wheels);Other (comment) (BSC)  Activity Tolerance: Patient limited by fatigue Patient left: in bed;with call bell/phone within reach;with bed alarm set  OT Visit Diagnosis: Unsteadiness on feet (R26.81);Other abnormalities of gait and mobility (R26.89);Muscle weakness (generalized) (M62.81);History of falling (Z91.81)                Time: 1610-9604 OT Time Calculation (min): 25 min Charges:  OT General Charges $OT Visit: 1 Visit OT Evaluation $OT Eval Moderate Complexity: 1 Mod OT Treatments $Therapeutic Activity: 8-22 mins    Galen Manila 06/30/2021, 1:19 PM

## 2021-07-01 DIAGNOSIS — F1190 Opioid use, unspecified, uncomplicated: Secondary | ICD-10-CM | POA: Diagnosis not present

## 2021-07-01 DIAGNOSIS — E785 Hyperlipidemia, unspecified: Secondary | ICD-10-CM | POA: Diagnosis not present

## 2021-07-01 DIAGNOSIS — F419 Anxiety disorder, unspecified: Secondary | ICD-10-CM | POA: Diagnosis not present

## 2021-07-01 DIAGNOSIS — J189 Pneumonia, unspecified organism: Secondary | ICD-10-CM | POA: Diagnosis not present

## 2021-07-01 LAB — CBC
HCT: 27.9 % — ABNORMAL LOW (ref 36.0–46.0)
Hemoglobin: 8.8 g/dL — ABNORMAL LOW (ref 12.0–15.0)
MCH: 28.4 pg (ref 26.0–34.0)
MCHC: 31.5 g/dL (ref 30.0–36.0)
MCV: 90 fL (ref 80.0–100.0)
Platelets: 309 10*3/uL (ref 150–400)
RBC: 3.1 MIL/uL — ABNORMAL LOW (ref 3.87–5.11)
RDW: 17.1 % — ABNORMAL HIGH (ref 11.5–15.5)
WBC: 10.4 10*3/uL (ref 4.0–10.5)
nRBC: 0.2 % (ref 0.0–0.2)

## 2021-07-01 MED ORDER — FUROSEMIDE 10 MG/ML IJ SOLN
20.0000 mg | Freq: Once | INTRAMUSCULAR | Status: AC
Start: 2021-07-01 — End: 2021-07-01
  Administered 2021-07-01: 20 mg via INTRAVENOUS
  Filled 2021-07-01: qty 2

## 2021-07-01 MED ORDER — AZITHROMYCIN 250 MG PO TABS
500.0000 mg | ORAL_TABLET | Freq: Every day | ORAL | Status: AC
Start: 2021-07-02 — End: 2021-07-02
  Administered 2021-07-02: 500 mg via ORAL
  Filled 2021-07-01: qty 2

## 2021-07-02 DIAGNOSIS — F419 Anxiety disorder, unspecified: Secondary | ICD-10-CM | POA: Diagnosis not present

## 2021-07-02 DIAGNOSIS — F1190 Opioid use, unspecified, uncomplicated: Secondary | ICD-10-CM | POA: Diagnosis not present

## 2021-07-02 DIAGNOSIS — E785 Hyperlipidemia, unspecified: Secondary | ICD-10-CM | POA: Diagnosis not present

## 2021-07-02 DIAGNOSIS — J189 Pneumonia, unspecified organism: Secondary | ICD-10-CM | POA: Diagnosis not present

## 2021-07-02 LAB — BASIC METABOLIC PANEL
Anion gap: 11 (ref 5–15)
BUN: 13 mg/dL (ref 8–23)
CO2: 28 mmol/L (ref 22–32)
Calcium: 8.1 mg/dL — ABNORMAL LOW (ref 8.9–10.3)
Chloride: 100 mmol/L (ref 98–111)
Creatinine, Ser: 0.73 mg/dL (ref 0.44–1.00)
GFR, Estimated: >60 mL/min (ref >60)
Glucose, Bld: 115 mg/dL — ABNORMAL HIGH (ref 70–99)
Potassium: 3.7 mmol/L (ref 3.5–5.1)
Sodium: 139 mmol/L (ref 135–145)

## 2021-07-02 MED ORDER — SODIUM CHLORIDE 0.9 % IV SOLN: INTRAVENOUS | Status: DC | PRN

## 2021-07-02 MED ORDER — FUROSEMIDE 10 MG/ML IJ SOLN
20.0000 mg | Freq: Once | INTRAMUSCULAR | Status: AC
Start: 2021-07-02 — End: 2021-07-02
  Administered 2021-07-02: 20 mg via INTRAVENOUS
  Filled 2021-07-02: qty 2

## 2021-07-03 DIAGNOSIS — E785 Hyperlipidemia, unspecified: Secondary | ICD-10-CM | POA: Diagnosis not present

## 2021-07-03 DIAGNOSIS — J189 Pneumonia, unspecified organism: Secondary | ICD-10-CM | POA: Diagnosis not present

## 2021-07-03 DIAGNOSIS — F419 Anxiety disorder, unspecified: Secondary | ICD-10-CM | POA: Diagnosis not present

## 2021-07-03 DIAGNOSIS — F1190 Opioid use, unspecified, uncomplicated: Secondary | ICD-10-CM | POA: Diagnosis not present

## 2021-07-03 LAB — CULTURE, BLOOD (ROUTINE X 2)
Culture: NO GROWTH
Culture: NO GROWTH
Special Requests: ADEQUATE

## 2021-07-03 LAB — BASIC METABOLIC PANEL
Anion gap: 10 (ref 5–15)
BUN: 12 mg/dL (ref 8–23)
CO2: 30 mmol/L (ref 22–32)
Calcium: 8.5 mg/dL — ABNORMAL LOW (ref 8.9–10.3)
Chloride: 97 mmol/L — ABNORMAL LOW (ref 98–111)
Creatinine, Ser: 0.77 mg/dL (ref 0.44–1.00)
GFR, Estimated: >60 mL/min (ref >60)
Glucose, Bld: 130 mg/dL — ABNORMAL HIGH (ref 70–99)
Potassium: 4.3 mmol/L (ref 3.5–5.1)
Sodium: 137 mmol/L (ref 135–145)

## 2021-07-03 MED ORDER — ACYCLOVIR 5 % EX OINT
TOPICAL_OINTMENT | Freq: Every day | CUTANEOUS | Status: DC
  Filled 2021-07-03: qty 15

## 2021-07-03 MED ORDER — DICLOFENAC SODIUM 1 % EX GEL
2.0000 g | Freq: Four times a day (QID) | CUTANEOUS | Status: DC
  Administered 2021-07-03 – 2021-07-04 (×4): 2 g via TOPICAL
  Filled 2021-07-03: qty 100

## 2021-07-03 MED ORDER — FUROSEMIDE 10 MG/ML IJ SOLN
20.0000 mg | Freq: Once | INTRAMUSCULAR | Status: AC
Start: 2021-07-03 — End: 2021-07-03
  Administered 2021-07-03: 20 mg via INTRAVENOUS
  Filled 2021-07-03: qty 2

## 2021-07-03 NOTE — Progress Notes (Signed)
Mobility Specialist Progress Note:   07/03/21 1200  Mobility  Activity Transferred from bed to chair  Level of Assistance Minimal assist, patient does 75% or more  Assistive Device Front wheel walker  Distance Ambulated (ft) 2 ft  Activity Response Tolerated well  $Mobility charge 1 Mobility   MinA to stand and step to recliner. Left in recliner with call bell in reach and all needs met.   [facility] Sherle Mello Mobility Specialist

## 2021-07-03 NOTE — Care Management Important Message (Signed)
Important Message  Patient Details  Name: Jocelyn Bauer MRN: [id number] Date of Birth: 01-Jun-1928   Medicare Important Message Given:  Yes     [name] 07/03/2021, 8:58 AM

## 2021-07-03 NOTE — Progress Notes (Signed)
Physical Therapy Treatment Patient Details Name: Jocelyn Bauer MRN: [id number] DOB: [birth year] Today's Date: 07/03/2021   History of Present Illness 88 y.o. female presents to [facility] hospital on 06/28/2021 with SOB and cough. CXR concerning for bilateral PNA. PMH includes anxiety, RA, chronic pain, HLD, HTN, UTI.    PT Comments    Patient sitting up in chair on arrival to the room. She refused standing despite encouragement and offering to assist multiple times. Lower extremity exercises performed for strengthening from seated position in recliner. Encouragement provided for being OOB to chair for upright conditioning and mobilizing with staff as able. Recommend to continue PT to maximize independence and facilitate return to prior level of function. SNF is recommended at discharge.    Recommendations for follow up therapy are one component of a multi-disciplinary discharge planning process, led by the attending physician.  Recommendations may be updated based on patient status, additional functional criteria and insurance authorization.  Follow Up Recommendations  Skilled nursing-short term rehab (<3 hours/day) Can patient physically be transported by private vehicle: Yes   Assistance Recommended at Discharge Frequent or constant Supervision/Assistance  Patient can return home with the following A little help with walking and/or transfers;A little help with bathing/dressing/bathroom;Help with stairs or ramp for entrance;Assist for transportation;Assistance with cooking/housework   Equipment Recommendations  None recommended by PT    Recommendations for Other Services       Precautions / Restrictions Precautions Precautions: Fall Restrictions Weight Bearing Restrictions: No     Mobility  Bed Mobility               General bed mobility comments: not assessed as patient sitting up on arrival and post session    Transfers                   General transfer comment:  she declined standing despite offering multiple times. she reports her knee hurts too much and she already stood up once today. encouragement provided for standing activity as able    Ambulation/Gait                   Stairs             Wheelchair Mobility    Modified Rankin (Stroke Patients Only)       Balance                                            Cognition Arousal/Alertness: Awake/alert Behavior During Therapy: WFL for tasks assessed/performed Overall Cognitive Status: Within Functional Limits for tasks assessed                                 General Comments: patient able to follow single step commands consistently.        Exercises General Exercises - Lower Extremity Ankle Circles/Pumps: AROM, Strengthening, Both, 15 reps, Seated Long Arc Quad: AAROM, Strengthening, Both, 15 reps, Seated, AROM Heel Slides: AAROM, Strengthening, Both, 10 reps, Seated, AROM Straight Leg Raises: AAROM, Strengthening, Both, 15 reps, Seated, AROM Hip Flexion/Marching: AAROM, Strengthening, Both, 10 reps, Seated, AROM Other Exercises Other Exercises: verbal cues and occasional assistance required to complete exercises for strengthening BLE. no significant change in vitals is noted with activity. minimal left knee pain with exercises reported    General Comments  Pertinent Vitals/Pain Pain Assessment Pain Assessment: Faces Faces Pain Scale: Hurts little more Pain Location: left knee Pain Descriptors / Indicators: Sore Pain Intervention(s): Limited activity within patient's tolerance, Monitored during session    Home Living                          Prior Function            PT Goals (current goals can now be found in the care plan section) Acute Rehab PT Goals Patient Stated Goal: to get to rehab PT Goal Formulation: With patient Time For Goal Achievement: 07/13/21 Potential to Achieve Goals: Fair Progress  towards PT goals: Progressing toward goals    Frequency    Min 3X/week      PT Plan Current plan remains appropriate    Co-evaluation              AM-PAC PT "6 Clicks" Mobility   Outcome Measure  Help needed turning from your back to your side while in a flat bed without using bedrails?: A Little Help needed moving from lying on your back to sitting on the side of a flat bed without using bedrails?: A Little Help needed moving to and from a bed to a chair (including a wheelchair)?: A Little Help needed standing up from a chair using your arms (e.g., wheelchair or bedside chair)?: A Little Help needed to walk in hospital room?: Total Help needed climbing 3-5 steps with a railing? : Total 6 Click Score: 14    End of Session Equipment Utilized During Treatment: Oxygen Activity Tolerance: Patient limited by fatigue Patient left: in chair;with call bell/phone within reach;with chair alarm set   PT Visit Diagnosis: Muscle weakness (generalized) (M62.81);Unsteadiness on feet (R26.81);History of falling (Z91.81)     Time: 4098-1191 PT Time Calculation (min) (ACUTE ONLY): 14 min  Charges:  $Therapeutic Exercise: 8-22 mins                    Donna Bernard, PT, MPT    Jocelyn Bauer 07/03/2021, 2:41 PM

## 2021-07-04 DIAGNOSIS — R0602 Shortness of breath: Secondary | ICD-10-CM | POA: Diagnosis not present

## 2021-07-04 DIAGNOSIS — R600 Localized edema: Secondary | ICD-10-CM | POA: Diagnosis not present

## 2021-07-04 DIAGNOSIS — R1310 Dysphagia, unspecified: Secondary | ICD-10-CM | POA: Diagnosis not present

## 2021-07-04 DIAGNOSIS — F32A Depression, unspecified: Secondary | ICD-10-CM | POA: Diagnosis not present

## 2021-07-04 DIAGNOSIS — M6281 Muscle weakness (generalized): Secondary | ICD-10-CM | POA: Diagnosis not present

## 2021-07-04 DIAGNOSIS — M810 Age-related osteoporosis without current pathological fracture: Secondary | ICD-10-CM | POA: Diagnosis not present

## 2021-07-04 DIAGNOSIS — E538 Deficiency of other specified B group vitamins: Secondary | ICD-10-CM | POA: Diagnosis not present

## 2021-07-04 DIAGNOSIS — J811 Chronic pulmonary edema: Secondary | ICD-10-CM | POA: Diagnosis not present

## 2021-07-04 DIAGNOSIS — R41841 Cognitive communication deficit: Secondary | ICD-10-CM | POA: Diagnosis not present

## 2021-07-04 DIAGNOSIS — J9621 Acute and chronic respiratory failure with hypoxia: Secondary | ICD-10-CM | POA: Diagnosis not present

## 2021-07-04 DIAGNOSIS — E039 Hypothyroidism, unspecified: Secondary | ICD-10-CM | POA: Diagnosis not present

## 2021-07-04 DIAGNOSIS — R059 Cough, unspecified: Secondary | ICD-10-CM | POA: Diagnosis not present

## 2021-07-04 DIAGNOSIS — R5381 Other malaise: Secondary | ICD-10-CM | POA: Diagnosis not present

## 2021-07-04 DIAGNOSIS — G894 Chronic pain syndrome: Secondary | ICD-10-CM | POA: Diagnosis not present

## 2021-07-04 DIAGNOSIS — E875 Hyperkalemia: Secondary | ICD-10-CM | POA: Diagnosis not present

## 2021-07-04 DIAGNOSIS — Z741 Need for assistance with personal care: Secondary | ICD-10-CM | POA: Diagnosis not present

## 2021-07-04 DIAGNOSIS — Z7401 Bed confinement status: Secondary | ICD-10-CM | POA: Diagnosis not present

## 2021-07-04 DIAGNOSIS — A419 Sepsis, unspecified organism: Secondary | ICD-10-CM | POA: Diagnosis not present

## 2021-07-04 DIAGNOSIS — I10 Essential (primary) hypertension: Secondary | ICD-10-CM | POA: Diagnosis not present

## 2021-07-04 DIAGNOSIS — M7989 Other specified soft tissue disorders: Secondary | ICD-10-CM | POA: Diagnosis not present

## 2021-07-04 DIAGNOSIS — K5900 Constipation, unspecified: Secondary | ICD-10-CM | POA: Diagnosis not present

## 2021-07-04 DIAGNOSIS — M2550 Pain in unspecified joint: Secondary | ICD-10-CM | POA: Diagnosis not present

## 2021-07-04 DIAGNOSIS — D649 Anemia, unspecified: Secondary | ICD-10-CM | POA: Diagnosis not present

## 2021-07-04 DIAGNOSIS — E559 Vitamin D deficiency, unspecified: Secondary | ICD-10-CM | POA: Diagnosis not present

## 2021-07-04 DIAGNOSIS — M549 Dorsalgia, unspecified: Secondary | ICD-10-CM | POA: Diagnosis not present

## 2021-07-04 DIAGNOSIS — Z9981 Dependence on supplemental oxygen: Secondary | ICD-10-CM | POA: Diagnosis not present

## 2021-07-04 DIAGNOSIS — I509 Heart failure, unspecified: Secondary | ICD-10-CM | POA: Diagnosis not present

## 2021-07-04 DIAGNOSIS — M069 Rheumatoid arthritis, unspecified: Secondary | ICD-10-CM | POA: Diagnosis not present

## 2021-07-04 DIAGNOSIS — J189 Pneumonia, unspecified organism: Secondary | ICD-10-CM | POA: Diagnosis not present

## 2021-07-04 DIAGNOSIS — K219 Gastro-esophageal reflux disease without esophagitis: Secondary | ICD-10-CM | POA: Diagnosis not present

## 2021-07-04 DIAGNOSIS — R0989 Other specified symptoms and signs involving the circulatory and respiratory systems: Secondary | ICD-10-CM | POA: Diagnosis not present

## 2021-07-04 DIAGNOSIS — R2681 Unsteadiness on feet: Secondary | ICD-10-CM | POA: Diagnosis not present

## 2021-07-04 DIAGNOSIS — G8929 Other chronic pain: Secondary | ICD-10-CM | POA: Diagnosis not present

## 2021-07-04 DIAGNOSIS — K5909 Other constipation: Secondary | ICD-10-CM | POA: Diagnosis not present

## 2021-07-04 LAB — BASIC METABOLIC PANEL
Anion gap: 11 (ref 5–15)
BUN: 14 mg/dL (ref 8–23)
CO2: 28 mmol/L (ref 22–32)
Calcium: 8.9 mg/dL (ref 8.9–10.3)
Chloride: 96 mmol/L — ABNORMAL LOW (ref 98–111)
Creatinine, Ser: 0.8 mg/dL (ref 0.44–1.00)
GFR, Estimated: >60 mL/min (ref >60)
Glucose, Bld: 123 mg/dL — ABNORMAL HIGH (ref 70–99)
Potassium: 4.2 mmol/L (ref 3.5–5.1)
Sodium: 135 mmol/L (ref 135–145)

## 2021-07-04 MED ORDER — FENTANYL 50 MCG/HR TD PT72: 1.0000 | MEDICATED_PATCH | TRANSDERMAL | 0 refills | Status: AC

## 2021-07-04 MED ORDER — PREDNISONE 5 MG PO TABS: 5.0000 mg | ORAL_TABLET | Freq: Every day | ORAL | Status: AC

## 2021-07-04 MED ORDER — ACYCLOVIR 5 % EX OINT: TOPICAL_OINTMENT | Freq: Every day | CUTANEOUS | 0 refills | Status: DC

## 2021-07-04 MED ORDER — OXYCODONE-ACETAMINOPHEN 10-325 MG PO TABS
1.0000 | ORAL_TABLET | Freq: Four times a day (QID) | ORAL | 0 refills | Status: DC | PRN
Start: 2021-07-04 — End: 2022-09-09

## 2021-07-04 MED ORDER — LORAZEPAM 0.5 MG PO TABS: 0.5000 mg | ORAL_TABLET | Freq: Two times a day (BID) | ORAL | 0 refills | Status: AC | PRN

## 2021-07-04 NOTE — Progress Notes (Signed)
Mobility Specialist Progress Note:   07/04/21 1058  Mobility  Activity Ambulated with assistance in room;Transferred from bed to chair  Level of Assistance Minimal assist, patient does 75% or more  Assistive Device  (HHA)  Distance Ambulated (ft) 2 ft  Activity Response Tolerated well  $Mobility charge 1 Mobility   Pt received in bed asking to go to get in the chair. No complaints of pain. Left in chair with call bell in reach and all needs met.   [facility] Loran Fleet Mobility Specialist

## 2021-07-04 NOTE — Discharge Summary (Signed)
Physician Discharge Summary  Jocelyn Bauer KGM:[id number] DOB: 09-08-1928 DOA: 06/28/2021  PCP: [name], MD  Admit date: 06/28/2021 Discharge date: 07/04/2021  Admitted From: Home Disposition: Skilled nursing facility for rehab  Recommendations for Outpatient Follow-up:  Follow up with PCP in 1-2 weeks after discharge Use oxygen to keep saturation more than 90%, wean off as possible.  Continue respiratory therapies.  Home Health: N/A Equipment/Devices: N/A  Discharge Condition: Fair CODE STATUS: DNR Diet recommendation: Regular diet  Discharge summary:  88 year old with history of chronic pain syndrome, rheumatoid arthritis on multiple pain regimen and maintenance prednisone, hypertension, hyperlipidemia and hypothyroidism presented to the hospital with shortness of breath cough and found to have multifocal pneumonia, sepsis and respiratory failure needing supplemental oxygen.  She was admitted and treated for multifocal pneumonia as below.  Multifocal pneumonia with hypoxemia, sepsis present on admission secondary pneumonia: Patient was immunocompromised, she did not have any fever or leukocytosis, however had evidence of multifocal pneumonia on chest x-ray.  COVID-19 and inclusion were negative.  Patient was treated with ceftriaxone and azithromycin for 5 days and completed IV antibiotic therapy in the hospital. Respiratory symptoms has improved. Occasionally using 1 to 2 L of oxygen.  Intermittently given diuresis. Symptoms improved. Continue respiratory therapy, bronchodilators and oxygen therapy and mobility at a skilled nursing rehab. All cultures are negative.  Rheumatoid arthritis, chronic pain syndrome: Patient on Prolia and methotrexate.  She is on maintenance prednisone 5 mg daily.  She was given a stress dose of steroids, now will continue prednisone. Patient is on fentanyl patch, as needed Percocet and also on benzodiazepines.  Pain is well controlled now.  She does  have chronic pain management and has provider as outpatient.  Hypothyroidism: On Synthroid 125 mg daily.  Resume similar doses.  Essential hypertension: Well controlled on amlodipine and benazepril.  Continue.  Mood disorder: On Zoloft that she will be continued.   Medically stable to discharge to skilled level of care.    Discharge Diagnoses:  Principal Problem:   Sepsis due to pneumonia [facility]) Active Problems:   Hypertension   Hypothyroidism   Hyperlipidemia   Anxiety   Chronic, continuous use of opioids   RA (rheumatoid arthritis) (HCC)   DNR (do not resuscitate)    Discharge Instructions  Discharge Instructions     Call MD for:  difficulty breathing, headache or visual disturbances   Complete by: As directed    Call MD for:  temperature >100.4   Complete by: As directed    Diet - low sodium heart healthy   Complete by: As directed    Discharge instructions   Complete by: As directed    Continue using oxygen to keep sats >90% Continue doing incentive and flutter valve after discharge   Increase activity slowly   Complete by: As directed       Allergies as of 07/04/2021       Reactions   Contrast Media [iodinated Contrast Media] Other (See Comments)   Itching.( 13 hr pre medication regimen worked fine. )   Tdap [tetanus-diphth-acell Pertussis] Hives   Streptococcus (diplococcus) Pneumoniae [streptococci] Other (See Comments)   Shortness of breath        Medication List     TAKE these medications    acetaminophen 325 MG tablet Commonly known as: TYLENOL Take 2 tablets (650 mg total) by mouth 3 (three) times daily.   acyclovir ointment 5 % Commonly known as: ZOVIRAX Apply topically 5 (five) times daily.   albuterol (  2.5 MG/3ML) 0.083% nebulizer solution Commonly known as: PROVENTIL Take 3 mLs (2.5 mg total) by nebulization every 2 (two) hours as needed for wheezing.   amLODipine 5 MG tablet Commonly known as: NORVASC Take 5 mg by mouth  daily.   aspirin EC 81 MG tablet Take 1 tablet (81 mg total) by mouth daily. Swallow whole.   benazepril 20 MG tablet Commonly known as: LOTENSIN Take 20 mg by mouth every morning.   bisacodyl 5 MG EC tablet Commonly known as: DULCOLAX Take 1 tablet (5 mg total) by mouth daily as needed for moderate constipation.   cholecalciferol 25 MCG (1000 UNIT) tablet Commonly known as: VITAMIN D3 Take 3,000 Units by mouth daily.   denosumab 60 MG/ML Sosy injection Commonly known as: PROLIA Inject 60 mg into the skin every 6 (six) months.   docusate sodium 100 MG capsule Commonly known as: COLACE Take 2 capsules (200 mg total) by mouth 2 (two) times daily. What changed:  when to take this reasons to take this   fentaNYL 50 MCG/HR Commonly known as: [place] 1 patch onto the skin every 3 (three) days for 6 days.   folic acid 1 MG tablet Commonly known as: FOLVITE Take 1 mg by mouth daily.   guaiFENesin 600 MG 12 hr tablet Commonly known as: MUCINEX Take 1 tablet (600 mg total) by mouth 2 (two) times daily as needed for cough.   levothyroxine 125 MCG tablet Commonly known as: SYNTHROID Take 125 mcg by mouth daily before breakfast.   LORazepam 0.5 MG tablet Commonly known as: ATIVAN Take 1 tablet (0.5 mg total) by mouth 2 (two) times daily as needed for up to 5 days for anxiety.   methotrexate 2.5 MG tablet Commonly known as: RHEUMATREX Take 15 mg by mouth every Sunday.   Multivitamin Gummies Adult Chew Chew 1 tablet by mouth daily.   omeprazole 20 MG capsule Commonly known as: PRILOSEC Take 20 mg by mouth daily as needed (heartburn).   oxyCODONE-acetaminophen 10-325 MG tablet Commonly known as: PERCOCET Take 1 tablet by mouth every 6 (six) hours as needed for pain.   polyethylene glycol 17 g packet Commonly known as: MIRALAX / GLYCOLAX Take 17 g by mouth daily as needed for mild constipation.   predniSONE 5 MG tablet Commonly known as: DELTASONE Take 1  tablet (5 mg total) by mouth daily. Start taking on: July 05, 2021 What changed:  how much to take how to take this when to take this additional instructions   sertraline 50 MG tablet Commonly known as: ZOLOFT Take 50 mg by mouth daily.   vitamin B-12 1000 MCG tablet Commonly known as: CYANOCOBALAMIN Take 1,000 mcg by mouth daily.        Allergies  Allergen Reactions   Contrast Media [Iodinated Contrast Media] Other (See Comments)    Itching.( 13 hr pre medication regimen worked fine. )   Tdap [Tetanus-Diphth-Acell Pertussis] Hives   Streptococcus (Diplococcus) Pneumoniae [Streptococci] Other (See Comments)    Shortness of breath    Consultations: None   Procedures/Studies: DG Swallowing Func-Speech Pathology  Result Date: 06/29/2021 Table formatting from the original result was not included. Objective Swallowing Evaluation: Type of Study: MBS-Modified Barium Swallow Study  Patient Details Name: MELEENA MUNROE MRN: [id number] Date of Birth: February 15, 1928 Today's Date: 06/29/2021 Time: SLP Start Time (ACUTE ONLY): 1007 -SLP Stop Time (ACUTE ONLY): 1016 SLP Time Calculation (min) (ACUTE ONLY): 9 min Past Medical History: Past Medical History: Diagnosis Date  Anxiety  HX OF PANIC ATTACKS  Arthritis   RA  Bilateral ankle fractures 07/26/2019  Chronic neck and back pain   Chronic pain   Chronic, continuous use of opioids 07/28/2019  Elbow locking   due to RA- elbow with limited extension  GERD (gastroesophageal reflux disease)   Headache   Hyperlipidemia   Hypertension   Hypothyroidism   Osteoarthritis   Osteoporosis   Pneumonia   Shortness of breath   occas  UTI (urinary tract infection)  Past Surgical History: Past Surgical History: Procedure Laterality Date  CATARACT EXTRACTION Bilateral   COLONOSCOPY    ELBOW SURGERY Right   ELBOW SURGERY Left   ESOPHAGOGASTRODUODENOSCOPY    HARDWARE REMOVAL Right 07/13/2015  Procedure:  WITH HARDWARE REMOVAL REPAIR AS NECESSARY;  Surgeon: Roseanne Kaufman,  MD;  Location: [place];  Service: Orthopedics;  Laterality: Right;  HARDWARE REMOVAL Right 01/22/2021  Procedure: HARDWARE REMOVAL;  Surgeon: Altamese Lyman, MD;  Location: [place];  Service: Orthopedics;  Laterality: Right;  JOINT REPLACEMENT  1998  left hip replacement and bilateral elbow replacements  about 15 yrs ago  ORIF ANKLE FRACTURE Bilateral 07/27/2019  Procedure: OPEN REDUCTION INTERNAL FIXATION (ORIF) ANKLE FRACTURE;  Surgeon: Altamese Bel-Nor, MD;  Location: [place];  Service: Orthopedics;  Laterality: Bilateral;  thyroidectomy    TOTAL ELBOW ARTHROPLASTY Right 07/13/2015  Procedure: RIGHT ELBOW REVISION TOTAL ELBOW ARTHROPLASTY ;  Surgeon: Roseanne Kaufman, MD;  Location: [place];  Service: Orthopedics;  Laterality: Right;  TOTAL HIP ARTHROPLASTY    TOTAL HIP REVISION  03/11/2011  Procedure: TOTAL HIP REVISION;  Surgeon: [name], MD;  Location: [facility];  Service: Orthopedics;  Laterality: Left;  TRICEPS TENDON REPAIR Right 07/13/2015  Procedure:  TRICEP RECONSTRUCTION REPAIR WITH ALLOGRAFT, ULNAR NERVE NEUROLYSIS;  Surgeon: Roseanne Kaufman, MD;  Location: [place];  Service: Orthopedics;  Laterality: Right; HPI: WAKISHA ALBERTS is a 88 y.o. female presented with SOB, cough.  She was last admitted from 3/11-15 for multifocal PNA, treated with antibiotics and discharged with home O2. She had come from home but was discharged to [facility].  She remained at [facility] for about a month and returned home off O2.  She has done fairly well but started with cough and SOB about 3 days ago.  No fever.  No coughing/choking/sputtering while eating and drinking. CXR 6/22: "Patchy bilateral pulmonary opacity more extensive on the right than  seen previously." Pt with medical history significant of chronic pain; HTN; HLD; hypothyroidism  Subjective: daughter present for assessment  Recommendations for follow up therapy are one component of a multi-disciplinary discharge planning process, led by the attending physician.   Recommendations may be updated based on patient status, additional functional criteria and insurance authorization. Assessment / Plan / Recommendation   06/29/2021  10:45 AM Clinical Impressions Clinical Impression Pt presents with grossly normal oropharyngeal swallow.  There was delayed swallow initiation with thin liquid at the vallecula which resulted in trace, transient penetration with both cup and straw presentation.  There was no penetration or aspiration with any other consistencies.  There was some retention of contrast on esophageal sweep, but no stasis of tablet.  Of note: pt appears to have CP bar, which did not impede bolus flow.  There is also possible cervical osteophyte around the level of C5, which impinges on esophagus but did not impede bolus flow. Pt will likely be able to advance to regular texture diet once her bottom denture plate is present.  SLP to follow for clinical  reassessment once dentures available. Recommend continuing mechanical soft solid diet with thin liquids.  SLP Visit Diagnosis Dysphagia, unspecified (R13.10) Impact on safety and function No limitations     06/29/2021  10:45 AM Treatment Recommendations Treatment Recommendations Therapy as outlined in treatment plan below     06/29/2021  10:50 AM Prognosis Prognosis for Safe Diet Advancement Good   06/29/2021  10:45 AM Diet Recommendations SLP Diet Recommendations Dysphagia 3 (Mech soft) solids;Thin liquid Liquid Administration via Straw;Cup Medication Administration Whole meds with liquid Compensations Slow rate;Small sips/bites Postural Changes Seated upright at 90 degrees     06/29/2021  10:45 AM Other Recommendations Oral Care Recommendations Oral care BID Assistance recommended at discharge None Functional Status Assessment Patient has had a recent decline in their functional status and demonstrates the ability to make significant improvements in function in a reasonable and predictable amount of time.   06/29/2021  10:45 AM  Frequency and Duration  Speech Therapy Frequency (ACUTE ONLY) min 1 x/week Treatment Duration 1 week     06/29/2021  10:34 AM Oral Phase Oral Phase Impaired Oral - Thin Cup Premature spillage Oral - Thin Straw Premature spillage Oral - Puree WFL Oral - Regular -- Oral - Pill [facility]    06/29/2021  10:41 AM Pharyngeal Phase Pharyngeal Phase Impaired Pharyngeal- Thin Cup Delayed swallow initiation-pyriform sinuses;Penetration/Aspiration before swallow Pharyngeal Material enters airway, remains ABOVE vocal cords then ejected out Pharyngeal- Thin Straw Delayed swallow initiation-pyriform sinuses;Penetration/Aspiration before swallow Pharyngeal Material enters airway, remains ABOVE vocal cords then ejected out Pharyngeal- Puree [facility] Pharyngeal Material does not enter airway Pharyngeal- Regular [facility] Pharyngeal Material does not enter airway Pharyngeal- Pill WFL Pharyngeal Material does not enter airway    06/29/2021  10:44 AM Cervical Esophageal Phase  Cervical Esophageal Phase -- Celedonio Savage, MA, CCC-SLP Acute Rehabilitation Services Office: [phone] 06/29/2021, 10:56 AM                     DG Chest Port 1 View  Result Date: 06/28/2021 CLINICAL DATA:  Shortness of breath EXAM: PORTABLE CHEST 1 VIEW COMPARISON:  03/17/2021 FINDINGS: Patchy bilateral pulmonary opacity more extensive on the right than seen previously. Mild cardiomegaly. Extensive atheromatous calcification of the aorta. Thin lines over the bilateral paramedian chest are likely external to the patient given their extent. No suspected pneumomediastinum. IMPRESSION: Patchy bilateral pneumonia. Electronically Signed   By: Jorje Guild M.D.   On: 06/28/2021 05:48   (Echo, Carotid, EGD, Colonoscopy, ERCP)    Subjective: Patient seen and examined.  Pleasant.  On 2 L oxygen.  Denies any complaints.  Patient tells me that depends upon the day and time sometimes she feels short of breath.   Discharge Exam: Vitals:   07/04/21 0400 07/04/21 0824  BP: (!)  112/55 (!) 123/50  Pulse: 80 99  Resp: 17 20  Temp: 98.1 F (36.7 C) 98.2 F (36.8 C)  SpO2: 93% 93%   Vitals:   07/03/21 2359 07/04/21 0000 07/04/21 0400 07/04/21 0824  BP:  101/61 (!) 112/55 (!) 123/50  Pulse: 75 76 80 99  Resp: (!) 29 (!) '22 17 20  '$ Temp:  98 F (36.7 C) 98.1 F (36.7 C) 98.2 F (36.8 C)  TempSrc:  Oral Oral Oral  SpO2: 98% 98% 93% 93%  Weight:  64.5 kg    Height:        General: Pt is alert, awake, not in acute distress Frail and debilitated. She does have some rheumatoid deformities of both  hands.  No swelling or tenderness. Cardiovascular: RRR, S1/S2 +, no rubs, no gallops Respiratory: CTA bilaterally, no wheezing, no rhonchi SpO2: 93 % O2 Flow Rate (L/min): 2 L/min  Abdominal: Soft, NT, ND, bowel sounds + Extremities: no edema, no cyanosis    The results of significant diagnostics from this hospitalization (including imaging, microbiology, ancillary and laboratory) are listed below for reference.     Microbiology: Recent Results (from the past 240 hour(s))  Blood culture (routine x 2)     Status: None   Collection Time: 06/28/21  5:41 AM   Specimen: BLOOD RIGHT HAND  Result Value Ref Range Status   Specimen Description BLOOD RIGHT HAND  Final   Special Requests   Final    BOTTLES DRAWN AEROBIC AND ANAEROBIC Blood Culture adequate volume   Culture   Final    NO GROWTH 5 DAYS Performed at [facility], [address] [place], [place] [address]    Report Status 07/03/2021 FINAL  Final  SARS Coronavirus 2 by RT PCR (hospital order, performed in [facility] hospital lab) *cepheid single result test* Anterior Nasal Swab     Status: None   Collection Time: 06/28/21  5:59 AM   Specimen: Anterior Nasal Swab  Result Value Ref Range Status   SARS Coronavirus 2 by RT PCR NEGATIVE NEGATIVE Final    Comment: (NOTE) SARS-CoV-2 target nucleic acids are NOT DETECTED.  The SARS-CoV-2 RNA is generally detectable in upper and lower respiratory  specimens during the acute phase of infection. The lowest concentration of SARS-CoV-2 viral copies this assay can detect is 250 copies / mL. A negative result does not preclude SARS-CoV-2 infection and should not be used as the sole basis for treatment or other patient management decisions.  A negative result may occur with improper specimen collection / handling, submission of specimen other than nasopharyngeal swab, presence of viral mutation(s) within the areas targeted by this assay, and inadequate number of viral copies (<250 copies / mL). A negative result must be combined with clinical observations, patient history, and epidemiological information.  Fact Sheet for Patients:   https://www.patel.info/  Fact Sheet for Healthcare Providers: https://hall.com/  This test is not yet approved or  cleared by the [place] FDA and has been authorized for detection and/or diagnosis of SARS-CoV-2 by FDA under an Emergency Use Authorization (EUA).  This EUA will remain in effect (meaning this test can be used) for the duration of the COVID-19 declaration under Section 564(b)(1) of the Act, 21 U.S.C. section 360bbb-3(b)(1), unless the authorization is terminated or revoked sooner.  Performed at [facility], [place] [address], [place], [place] [address]   Blood culture (routine x 2)     Status: None   Collection Time: 06/28/21  6:10 AM   Specimen: BLOOD LEFT HAND  Result Value Ref Range Status   Specimen Description BLOOD LEFT HAND  Final   Special Requests   Final    BOTTLES DRAWN AEROBIC AND ANAEROBIC Blood Culture results may not be optimal due to an excessive volume of blood received in culture bottles   Culture   Final    NO GROWTH 5 DAYS Performed at [facility], [place] [address], [place],  [address]    Report Status 07/03/2021 FINAL  Final  Respiratory (~20 pathogens) panel by PCR     Status: None   Collection Time:  06/29/21  5:05 PM   Specimen: Nasopharyngeal Swab; Respiratory  Result Value Ref Range  Status   Adenovirus NOT DETECTED NOT DETECTED Final   Coronavirus 229E NOT DETECTED NOT DETECTED Final    Comment: (NOTE) The Coronavirus on the Respiratory Panel, DOES NOT test for the novel  Coronavirus (2019 nCoV)    Coronavirus HKU1 NOT DETECTED NOT DETECTED Final   Coronavirus NL63 NOT DETECTED NOT DETECTED Final   Coronavirus OC43 NOT DETECTED NOT DETECTED Final   Metapneumovirus NOT DETECTED NOT DETECTED Final   Rhinovirus / Enterovirus NOT DETECTED NOT DETECTED Final   Influenza A NOT DETECTED NOT DETECTED Final   Influenza B NOT DETECTED NOT DETECTED Final   Parainfluenza Virus 1 NOT DETECTED NOT DETECTED Final   Parainfluenza Virus 2 NOT DETECTED NOT DETECTED Final   Parainfluenza Virus 3 NOT DETECTED NOT DETECTED Final   Parainfluenza Virus 4 NOT DETECTED NOT DETECTED Final   Respiratory Syncytial Virus NOT DETECTED NOT DETECTED Final   Bordetella pertussis NOT DETECTED NOT DETECTED Final   Bordetella Parapertussis NOT DETECTED NOT DETECTED Final   Chlamydophila pneumoniae NOT DETECTED NOT DETECTED Final   Mycoplasma pneumoniae NOT DETECTED NOT DETECTED Final    Comment: Performed at [facility], [place] [address], [place], [place] [address]     Labs: BNP (last 3 results) Recent Labs    03/17/21 0821  BNP 008.6*   Basic Metabolic Panel: Recent Labs  Lab 06/29/21 0041 06/30/21 0308 07/02/21 0408 07/03/21 0244 07/04/21 0313  NA 140 140 139 137 135  K 3.2* 3.8 3.7 4.3 4.2  CL 103 103 100 97* 96*  CO2 '26 27 28 30 28  '$ GLUCOSE 162* 151* 115* 130* 123*  BUN '14 18 13 12 14  '$ CREATININE 0.76 0.70 0.73 0.77 0.80  CALCIUM 8.5* 8.2* 8.1* 8.5* 8.9   Liver Function Tests: Recent Labs  Lab 06/28/21 0541  AST 22  ALT 16  ALKPHOS 60  BILITOT 0.5  PROT 6.4*  ALBUMIN 2.5*   No results for input(s): "LIPASE", "AMYLASE" in the last 168 hours. No results for input(s):  "AMMONIA" in the last 168 hours. CBC: Recent Labs  Lab 06/28/21 0541 06/29/21 0041 06/30/21 0308 07/01/21 0239  WBC 9.7 9.9 10.5 10.4  NEUTROABS 8.9*  --   --   --   HGB 9.2* 9.4* 8.6* 8.8*  HCT 28.0* 29.8* 27.4* 27.9*  MCV 89.7 89.8 89.5 90.0  PLT 407* 366 319 309   Cardiac Enzymes: No results for input(s): "CKTOTAL", "CKMB", "CKMBINDEX", "TROPONINI" in the last 168 hours. BNP: Invalid input(s): "POCBNP" CBG: No results for input(s): "GLUCAP" in the last 168 hours. D-Dimer No results for input(s): "DDIMER" in the last 72 hours. Hgb A1c No results for input(s): "HGBA1C" in the last 72 hours. Lipid Profile No results for input(s): "CHOL", "HDL", "LDLCALC", "TRIG", "CHOLHDL", "LDLDIRECT" in the last 72 hours. Thyroid function studies No results for input(s): "TSH", "T4TOTAL", "T3FREE", "THYROIDAB" in the last 72 hours.  Invalid input(s): "FREET3" Anemia work up No results for input(s): "VITAMINB12", "FOLATE", "FERRITIN", "TIBC", "IRON", "RETICCTPCT" in the last 72 hours. Urinalysis    Component Value Date/Time   COLORURINE YELLOW 06/28/2021 0701   APPEARANCEUR HAZY (A) 06/28/2021 0701   LABSPEC 1.015 06/28/2021 0701   PHURINE 6.0 06/28/2021 0701   GLUCOSEU NEGATIVE 06/28/2021 0701   HGBUR MODERATE (A) 06/28/2021 0701   BILIRUBINUR NEGATIVE 06/28/2021 0701   KETONESUR NEGATIVE 06/28/2021 0701   PROTEINUR NEGATIVE 06/28/2021 0701   UROBILINOGEN 0.2 10/31/2014 1240   NITRITE NEGATIVE 06/28/2021 0701   LEUKOCYTESUR TRACE (A) 06/28/2021 0701  Sepsis Labs Recent Labs  Lab 06/28/21 0541 06/29/21 0041 06/30/21 0308 07/01/21 0239  WBC 9.7 9.9 10.5 10.4   Microbiology Recent Results (from the past 240 hour(s))  Blood culture (routine x 2)     Status: None   Collection Time: 06/28/21  5:41 AM   Specimen: BLOOD RIGHT HAND  Result Value Ref Range Status   Specimen Description BLOOD RIGHT HAND  Final   Special Requests   Final    BOTTLES DRAWN AEROBIC AND ANAEROBIC  Blood Culture adequate volume   Culture   Final    NO GROWTH 5 DAYS Performed at [facility], [address], [place], [place] [address]    Report Status 07/03/2021 FINAL  Final  SARS Coronavirus 2 by RT PCR (hospital order, performed in [facility] hospital lab) *cepheid single result test* Anterior Nasal Swab     Status: None   Collection Time: 06/28/21  5:59 AM   Specimen: Anterior Nasal Swab  Result Value Ref Range Status   SARS Coronavirus 2 by RT PCR NEGATIVE NEGATIVE Final    Comment: (NOTE) SARS-CoV-2 target nucleic acids are NOT DETECTED.  The SARS-CoV-2 RNA is generally detectable in upper and lower respiratory specimens during the acute phase of infection. The lowest concentration of SARS-CoV-2 viral copies this assay can detect is 250 copies / mL. A negative result does not preclude SARS-CoV-2 infection and should not be used as the sole basis for treatment or other patient management decisions.  A negative result may occur with improper specimen collection / handling, submission of specimen other than nasopharyngeal swab, presence of viral mutation(s) within the areas targeted by this assay, and inadequate number of viral copies (<250 copies / mL). A negative result must be combined with clinical observations, patient history, and epidemiological information.  Fact Sheet for Patients:   https://www.patel.info/  Fact Sheet for Healthcare Providers: https://hall.com/  This test is not yet approved or  cleared by the [place] FDA and has been authorized for detection and/or diagnosis of SARS-CoV-2 by FDA under an Emergency Use Authorization (EUA).  This EUA will remain in effect (meaning this test can be used) for the duration of the COVID-19 declaration under Section 564(b)(1) of the Act, 21 U.S.C. section 360bbb-3(b)(1), unless the authorization is terminated or revoked sooner.  Performed at [facility], [place] [address] [place], [place] [address]   Blood culture (routine x 2)     Status: None   Collection Time: 06/28/21  6:10 AM   Specimen: BLOOD LEFT HAND  Result Value Ref Range Status   Specimen Description BLOOD LEFT HAND  Final   Special Requests   Final    BOTTLES DRAWN AEROBIC AND ANAEROBIC Blood Culture results may not be optimal due to an excessive volume of blood received in culture bottles   Culture   Final    NO GROWTH 5 DAYS Performed at [facility], [place] [address], [place], [place] [address]    Report Status 07/03/2021 FINAL  Final  Respiratory (~20 pathogens) panel by PCR     Status: None   Collection Time: 06/29/21  5:05 PM   Specimen: Nasopharyngeal Swab; Respiratory  Result Value Ref Range Status   Adenovirus NOT DETECTED NOT DETECTED Final   Coronavirus 229E NOT DETECTED NOT DETECTED Final    Comment: (NOTE) The Coronavirus on the Respiratory Panel, DOES NOT test for the novel  Coronavirus (2019 nCoV)    Coronavirus HKU1 NOT DETECTED NOT  DETECTED Final   Coronavirus NL63 NOT DETECTED NOT DETECTED Final   Coronavirus OC43 NOT DETECTED NOT DETECTED Final   Metapneumovirus NOT DETECTED NOT DETECTED Final   Rhinovirus / Enterovirus NOT DETECTED NOT DETECTED Final   Influenza A NOT DETECTED NOT DETECTED Final   Influenza B NOT DETECTED NOT DETECTED Final   Parainfluenza Virus 1 NOT DETECTED NOT DETECTED Final   Parainfluenza Virus 2 NOT DETECTED NOT DETECTED Final   Parainfluenza Virus 3 NOT DETECTED NOT DETECTED Final   Parainfluenza Virus 4 NOT DETECTED NOT DETECTED Final   Respiratory Syncytial Virus NOT DETECTED NOT DETECTED Final   Bordetella pertussis NOT DETECTED NOT DETECTED Final   Bordetella Parapertussis NOT DETECTED NOT DETECTED Final   Chlamydophila pneumoniae NOT DETECTED NOT DETECTED Final   Mycoplasma pneumoniae NOT DETECTED NOT DETECTED Final    Comment: Performed at [facility], [place] [address], [place], [place] [address]      Time coordinating discharge:  35 minutes  SIGNED:   Barb Merino, MD  [facility] 07/04/2021, 9:27 AM

## 2021-07-04 NOTE — TOC Transition Note (Signed)
Transition of Care [facility]) - CM/SW Discharge Note   Patient Details  Name: LALENA SALAS MRN: [id number] Date of Birth: May 07, 1928  Transition of Care [facility]) CM/SW Contact:  Bethann Berkshire, [place] Phone Number: 07/04/2021, 12:56 PM   Clinical Narrative:     Patient will DC to: [facility] SNF Anticipated DC date: 07/04/21 Family notified: Daughter Marcie Bal Transport by: Corey Harold   Per MD patient ready for DC to [facility]. RN, patient, patient's family, and facility notified of DC. Discharge Summary and FL2 sent to facility. RN to call report prior to discharge [phone]). DC packet on chart. Ambulance transport requested for patient.   CSW will sign off for now as social work intervention is no longer needed. Please consult [place] again if new needs arise.   Final next level of care: [place] Barriers to Discharge: No Barriers Identified   Patient Goals and CMS Choice        Discharge Placement              Patient chooses bed at: [facility] Patient to be transferred to facility by: [place] Name of family member notified: Daughter Marcie Bal Patient and family notified of of transfer: 07/04/21  Discharge Plan and Services                                     Social Determinants of Health (SDOH) Interventions     Readmission Risk Interventions     No data to display

## 2021-07-04 NOTE — Progress Notes (Signed)
Occupational Therapy Treatment Patient Details Name: Jocelyn Bauer MRN: [id number] DOB: [birth year] Today's Date: 07/04/2021   History of present illness 88 y.o. female presents to [facility] hospital on 06/28/2021 with SOB and cough. CXR concerning for bilateral PNA. PMH includes anxiety, RA, chronic pain, HLD, HTN, UTI.   OT comments  Patient received in recliner and agreeable to OT session but declined addressing transfers or standing. Patient performed light grooming with hand and face hygiene.  Patient led in AROM exercises to BUE but was limited by arthritis. Patient expected to discharge to SNF today.    Recommendations for follow up therapy are one component of a multi-disciplinary discharge planning process, led by the attending physician.  Recommendations may be updated based on patient status, additional functional criteria and insurance authorization.    Follow Up Recommendations  Skilled nursing-short term rehab (<3 hours/day)    Assistance Recommended at Discharge Frequent or constant Supervision/Assistance  Patient can return home with the following  A lot of help with bathing/dressing/bathroom;A little help with walking and/or transfers;Assistance with cooking/housework;Direct supervision/assist for medications management;Assist for transportation;Help with stairs or ramp for entrance   Equipment Recommendations  None recommended by OT    Recommendations for Other Services      Precautions / Restrictions Precautions Precautions: Fall Precaution Comments: Pt has anxiety and fear of falling, watch 02 Restrictions Weight Bearing Restrictions: No       Mobility Bed Mobility Overal bed mobility: Needs Assistance             General bed mobility comments: up in recliner    Transfers                   General transfer comment: declined standing or performing transfers     Balance Overall balance assessment: Needs assistance Sitting-balance support: Feet  supported, Bilateral upper extremity supported Sitting balance-Leahy Scale: Fair Sitting balance - Comments: seated in recliner                                   ADL either performed or assessed with clinical judgement   ADL Overall ADL's : Needs assistance/impaired     Grooming: Wash/dry hands;Wash/dry face;Set up;Sitting Grooming Details (indicate cue type and reason): performed seated in recliner                                    Extremity/Trunk Assessment Upper Extremity Assessment RUE Deficits / Details: RA in B hands LUE Deficits / Details: RA in B hands, elbow joint with fuxed flexion contracture 0-90 degrees            Vision       Perception     Praxis      Cognition Arousal/Alertness: Awake/alert Behavior During Therapy: WFL for tasks assessed/performed Overall Cognitive Status: Within Functional Limits for tasks assessed                                 General Comments: patient declined standing or transfers        Exercises Exercises: General Upper Extremity General Exercises - Upper Extremity Shoulder Flexion: AROM, Both, 10 reps, Seated Shoulder ABduction: AROM, Both, 10 reps, Seated Elbow Flexion: AROM, Both, 10 reps, Seated Elbow Extension: AROM, Both, 10 reps, Seated    Shoulder  Instructions       General Comments      Pertinent Vitals/ Pain       Pain Assessment Pain Assessment: No/denies pain Pain Intervention(s): Monitored during session  Home Living                                          Prior Functioning/Environment              Frequency  Min 2X/week        Progress Toward Goals  OT Goals(current goals can now be found in the care plan section)  Progress towards OT goals: Progressing toward goals  Acute Rehab OT Goals Patient Stated Goal: get better OT Goal Formulation: With patient Time For Goal Achievement: 07/14/21 Potential to Achieve Goals:  Good ADL Goals Pt Will Perform Grooming: with min guard assist;with supervision;standing Pt Will Perform Upper Body Bathing: with min guard assist;with supervision;sitting Pt Will Perform Lower Body Bathing: with min assist;sitting/lateral leans Pt Will Perform Upper Body Dressing: with min guard assist;with supervision;sitting Pt Will Transfer to Toilet: with min guard assist;with supervision;stand pivot transfer;ambulating;bedside commode;grab bars Pt Will Perform Toileting - Clothing Manipulation and hygiene: with min assist;sit to/from stand  Plan Discharge plan remains appropriate    Co-evaluation                 AM-PAC OT "6 Clicks" Daily Activity     Outcome Measure   Help from another person eating meals?: A Little Help from another person taking care of personal grooming?: A Little Help from another person toileting, which includes using toliet, bedpan, or urinal?: A Lot Help from another person bathing (including washing, rinsing, drying)?: A Lot Help from another person to put on and taking off regular upper body clothing?: A Little Help from another person to put on and taking off regular lower body clothing?: A Lot 6 Click Score: 15    End of Session Equipment Utilized During Treatment: Oxygen  OT Visit Diagnosis: Unsteadiness on feet (R26.81);Other abnormalities of gait and mobility (R26.89);Muscle weakness (generalized) (M62.81);History of falling (Z91.81)   Activity Tolerance Patient limited by fatigue   Patient Left in chair;with call bell/phone within reach;with chair alarm set   Nurse Communication Mobility status        Time: 2409-7353 OT Time Calculation (min): 25 min  Charges: OT General Charges $OT Visit: 1 Visit OT Treatments $Self Care/Home Management : 8-22 mins $Therapeutic Exercise: 8-22 mins  Lodema Hong, OTA Acute Rehabilitation Services  Office [phone]   Trixie Dredge 07/04/2021, 1:45 PM

## 2021-07-05 DIAGNOSIS — R5381 Other malaise: Secondary | ICD-10-CM | POA: Diagnosis not present

## 2021-07-05 DIAGNOSIS — J9621 Acute and chronic respiratory failure with hypoxia: Secondary | ICD-10-CM | POA: Diagnosis not present

## 2021-07-05 DIAGNOSIS — K219 Gastro-esophageal reflux disease without esophagitis: Secondary | ICD-10-CM | POA: Diagnosis not present

## 2021-07-05 DIAGNOSIS — M069 Rheumatoid arthritis, unspecified: Secondary | ICD-10-CM | POA: Diagnosis not present

## 2021-07-05 DIAGNOSIS — J189 Pneumonia, unspecified organism: Secondary | ICD-10-CM | POA: Diagnosis not present

## 2021-07-05 DIAGNOSIS — G894 Chronic pain syndrome: Secondary | ICD-10-CM | POA: Diagnosis not present

## 2021-07-05 DIAGNOSIS — M810 Age-related osteoporosis without current pathological fracture: Secondary | ICD-10-CM | POA: Diagnosis not present

## 2021-07-05 DIAGNOSIS — E559 Vitamin D deficiency, unspecified: Secondary | ICD-10-CM | POA: Diagnosis not present

## 2021-07-05 DIAGNOSIS — I10 Essential (primary) hypertension: Secondary | ICD-10-CM | POA: Diagnosis not present

## 2021-07-05 DIAGNOSIS — E039 Hypothyroidism, unspecified: Secondary | ICD-10-CM | POA: Diagnosis not present

## 2021-07-09 DIAGNOSIS — E039 Hypothyroidism, unspecified: Secondary | ICD-10-CM | POA: Diagnosis not present

## 2021-07-09 DIAGNOSIS — D649 Anemia, unspecified: Secondary | ICD-10-CM | POA: Diagnosis not present

## 2021-07-09 DIAGNOSIS — F32A Depression, unspecified: Secondary | ICD-10-CM | POA: Diagnosis not present

## 2021-07-09 DIAGNOSIS — M810 Age-related osteoporosis without current pathological fracture: Secondary | ICD-10-CM | POA: Diagnosis not present

## 2021-07-09 DIAGNOSIS — I10 Essential (primary) hypertension: Secondary | ICD-10-CM | POA: Diagnosis not present

## 2021-07-09 DIAGNOSIS — K219 Gastro-esophageal reflux disease without esophagitis: Secondary | ICD-10-CM | POA: Diagnosis not present

## 2021-07-09 DIAGNOSIS — K5900 Constipation, unspecified: Secondary | ICD-10-CM | POA: Diagnosis not present

## 2021-07-09 DIAGNOSIS — G894 Chronic pain syndrome: Secondary | ICD-10-CM | POA: Diagnosis not present

## 2021-07-09 DIAGNOSIS — J189 Pneumonia, unspecified organism: Secondary | ICD-10-CM | POA: Diagnosis not present

## 2021-07-09 DIAGNOSIS — M069 Rheumatoid arthritis, unspecified: Secondary | ICD-10-CM | POA: Diagnosis not present

## 2021-07-09 DIAGNOSIS — E559 Vitamin D deficiency, unspecified: Secondary | ICD-10-CM | POA: Diagnosis not present

## 2021-07-09 DIAGNOSIS — R5381 Other malaise: Secondary | ICD-10-CM | POA: Diagnosis not present

## 2021-07-12 ENCOUNTER — Other Ambulatory Visit: Payer: BLUE CROSS/BLUE SHIELD | Admitting: Family Medicine

## 2021-07-18 DIAGNOSIS — R600 Localized edema: Secondary | ICD-10-CM | POA: Diagnosis not present

## 2021-07-18 DIAGNOSIS — E875 Hyperkalemia: Secondary | ICD-10-CM | POA: Diagnosis not present

## 2021-07-18 DIAGNOSIS — K219 Gastro-esophageal reflux disease without esophagitis: Secondary | ICD-10-CM | POA: Diagnosis not present

## 2021-07-18 DIAGNOSIS — M810 Age-related osteoporosis without current pathological fracture: Secondary | ICD-10-CM | POA: Diagnosis not present

## 2021-07-18 DIAGNOSIS — R0989 Other specified symptoms and signs involving the circulatory and respiratory systems: Secondary | ICD-10-CM | POA: Diagnosis not present

## 2021-07-18 DIAGNOSIS — G894 Chronic pain syndrome: Secondary | ICD-10-CM | POA: Diagnosis not present

## 2021-07-18 DIAGNOSIS — E559 Vitamin D deficiency, unspecified: Secondary | ICD-10-CM | POA: Diagnosis not present

## 2021-07-18 DIAGNOSIS — K5900 Constipation, unspecified: Secondary | ICD-10-CM | POA: Diagnosis not present

## 2021-07-18 DIAGNOSIS — E538 Deficiency of other specified B group vitamins: Secondary | ICD-10-CM | POA: Diagnosis not present

## 2021-07-18 DIAGNOSIS — R5381 Other malaise: Secondary | ICD-10-CM | POA: Diagnosis not present

## 2021-07-18 DIAGNOSIS — I10 Essential (primary) hypertension: Secondary | ICD-10-CM | POA: Diagnosis not present

## 2021-07-23 DIAGNOSIS — E039 Hypothyroidism, unspecified: Secondary | ICD-10-CM | POA: Diagnosis not present

## 2021-07-23 DIAGNOSIS — R0989 Other specified symptoms and signs involving the circulatory and respiratory systems: Secondary | ICD-10-CM | POA: Diagnosis not present

## 2021-07-23 DIAGNOSIS — K5900 Constipation, unspecified: Secondary | ICD-10-CM | POA: Diagnosis not present

## 2021-07-23 DIAGNOSIS — K219 Gastro-esophageal reflux disease without esophagitis: Secondary | ICD-10-CM | POA: Diagnosis not present

## 2021-07-23 DIAGNOSIS — R5381 Other malaise: Secondary | ICD-10-CM | POA: Diagnosis not present

## 2021-07-23 DIAGNOSIS — E559 Vitamin D deficiency, unspecified: Secondary | ICD-10-CM | POA: Diagnosis not present

## 2021-07-23 DIAGNOSIS — R600 Localized edema: Secondary | ICD-10-CM | POA: Diagnosis not present

## 2021-07-23 DIAGNOSIS — D649 Anemia, unspecified: Secondary | ICD-10-CM | POA: Diagnosis not present

## 2021-07-23 DIAGNOSIS — M069 Rheumatoid arthritis, unspecified: Secondary | ICD-10-CM | POA: Diagnosis not present

## 2021-07-25 DIAGNOSIS — J189 Pneumonia, unspecified organism: Secondary | ICD-10-CM | POA: Diagnosis not present

## 2021-07-25 DIAGNOSIS — K219 Gastro-esophageal reflux disease without esophagitis: Secondary | ICD-10-CM | POA: Diagnosis not present

## 2021-07-25 DIAGNOSIS — K5900 Constipation, unspecified: Secondary | ICD-10-CM | POA: Diagnosis not present

## 2021-07-25 DIAGNOSIS — M069 Rheumatoid arthritis, unspecified: Secondary | ICD-10-CM | POA: Diagnosis not present

## 2021-07-25 DIAGNOSIS — D649 Anemia, unspecified: Secondary | ICD-10-CM | POA: Diagnosis not present

## 2021-07-25 DIAGNOSIS — I10 Essential (primary) hypertension: Secondary | ICD-10-CM | POA: Diagnosis not present

## 2021-07-25 DIAGNOSIS — R600 Localized edema: Secondary | ICD-10-CM | POA: Diagnosis not present

## 2021-07-25 DIAGNOSIS — R5381 Other malaise: Secondary | ICD-10-CM | POA: Diagnosis not present

## 2021-07-25 DIAGNOSIS — M810 Age-related osteoporosis without current pathological fracture: Secondary | ICD-10-CM | POA: Diagnosis not present

## 2021-07-25 DIAGNOSIS — E559 Vitamin D deficiency, unspecified: Secondary | ICD-10-CM | POA: Diagnosis not present

## 2021-07-25 DIAGNOSIS — G894 Chronic pain syndrome: Secondary | ICD-10-CM | POA: Diagnosis not present

## 2021-07-26 ENCOUNTER — Encounter: Payer: Self-pay | Admitting: Pulmonary Disease

## 2021-07-26 ENCOUNTER — Ambulatory Visit (INDEPENDENT_AMBULATORY_CARE_PROVIDER_SITE_OTHER): Payer: Medicare Other | Admitting: Pulmonary Disease

## 2021-07-26 ENCOUNTER — Ambulatory Visit (INDEPENDENT_AMBULATORY_CARE_PROVIDER_SITE_OTHER): Payer: Medicare Other

## 2021-07-26 VITALS — BP 118/62 | HR 77 | Temp 98.6°F | Wt 134.5 lb

## 2021-07-26 DIAGNOSIS — R0602 Shortness of breath: Secondary | ICD-10-CM

## 2021-07-26 DIAGNOSIS — I509 Heart failure, unspecified: Secondary | ICD-10-CM | POA: Diagnosis not present

## 2021-07-26 DIAGNOSIS — J189 Pneumonia, unspecified organism: Secondary | ICD-10-CM

## 2021-07-26 NOTE — Patient Instructions (Addendum)
Acute respiratory failure Community-acquired pneumonia Complete levaquin Wear oxygen for goal SpO2 >88% Wear 2L via [place] with activity and sleep. Wean as tolerated Recommend pneumococcal vaccine  Follow-up with me in end August or September

## 2021-07-26 NOTE — Progress Notes (Signed)
Subjective:   PATIENT ID: Jocelyn Bauer GENDER: female DOB: May 29, 1928, MRN: [id number]   HPI  Chief Complaint  Patient presents with   Consult    Pt is here for consult for recurring PNA. Pt states that she has had PNA twice so far this year. Last bout was last month. She is currently at a facility. Fluid around heart? Pt is on 2L of oxygen.     Reason for Visit: New consult for recurrent pneumonia  Jocelyn Bauer is a 88 year old female with RA, HTN, HLD, hypothyroidism, GERD and anxiety who presents for consultation for recurrent pneumonia.  She was recently diagnosed and hospitalized with pneumonia in March and June and last week as an outpatient. Currently on levaquin. She is currently on oxygen. She has shortness of breath with exertion. Occasional cough. Denies wheezing.  Social History: Negligible smoking history  I have personally reviewed patient's past medical/family/social history, allergies, current medications.  Past Medical History:  Diagnosis Date   Anxiety    HX OF PANIC ATTACKS   Arthritis    RA   Bilateral ankle fractures 07/26/2019   Chronic neck and back pain    Chronic pain    Chronic, continuous use of opioids 07/28/2019   Elbow locking    due to RA- elbow with limited extension   GERD (gastroesophageal reflux disease)    Headache    Hyperlipidemia    Hypertension    Hypothyroidism    Osteoarthritis    Osteoporosis    Pneumonia    Shortness of breath    occas   UTI (urinary tract infection)      Family History  Problem Relation Age of Onset   Heart attack Mother    Prostate cancer Father    Lung disease Brother    Heart attack Sister    COPD Sister    Cancer Sister        She does not know which type of cancer     Social History   Occupational History   Occupation: retired  Tobacco Use   Smoking status: Former    Years: 10.00    Types: Cigarettes   Smokeless tobacco: Never   Tobacco comments:    QUIT SMOKING IN THE  70"S  Substance and Sexual Activity   Alcohol use: No   Drug use: No   Sexual activity: Not on file    Allergies  Allergen Reactions   Contrast Media [Iodinated Contrast Media] Other (See Comments)    Itching.( 13 hr pre medication regimen worked fine. )   Tdap [Tetanus-Diphth-Acell Pertussis] Hives   Streptococcus (Diplococcus) Pneumoniae [Streptococci] Other (See Comments)    Shortness of breath     Outpatient Medications Prior to Visit  Medication Sig Dispense Refill   acetaminophen (TYLENOL) 325 MG tablet Take 2 tablets (650 mg total) by mouth 3 (three) times daily. 10 tablet 0   acyclovir ointment (ZOVIRAX) 5 % Apply topically 5 (five) times daily. 15 g 0   albuterol (PROVENTIL) (2.5 MG/3ML) 0.083% nebulizer solution Take 3 mLs (2.5 mg total) by nebulization every 2 (two) hours as needed for wheezing. 75 mL 12   amLODipine (NORVASC) 5 MG tablet Take 5 mg by mouth daily.     aspirin EC 81 MG tablet Take 1 tablet (81 mg total) by mouth daily. Swallow whole. 30 tablet    benazepril (LOTENSIN) 20 MG tablet Take 20 mg by mouth every morning.     bisacodyl (DULCOLAX) 5  MG EC tablet Take 1 tablet (5 mg total) by mouth daily as needed for moderate constipation. 30 tablet 0   cholecalciferol (VITAMIN D3) 25 MCG (1000 UNIT) tablet Take 3,000 Units by mouth daily.     denosumab (PROLIA) 60 MG/ML SOSY injection Inject 60 mg into the skin every 6 (six) months.     docusate sodium (COLACE) 100 MG capsule Take 2 capsules (200 mg total) by mouth 2 (two) times daily. (Patient taking differently: Take 200 mg by mouth daily as needed for moderate constipation.) 10 capsule 0   fentaNYL (DURAGESIC) 50 MCG/HR Place onto the skin every 3 (three) days.     folic acid (FOLVITE) 1 MG tablet Take 1 mg by mouth daily.     furosemide (LASIX) 40 MG tablet Take 40 mg by mouth daily.     guaiFENesin (MUCINEX) 600 MG 12 hr tablet Take 1 tablet (600 mg total) by mouth 2 (two) times daily as needed for cough.      levofloxacin (LEVAQUIN) 750 MG tablet Take 750 mg by mouth daily.     levothyroxine (SYNTHROID) 125 MCG tablet Take 125 mcg by mouth daily before breakfast.     LORazepam (ATIVAN) 0.5 MG tablet Take 0.5 mg by mouth 2 (two) times daily.     methotrexate (RHEUMATREX) 2.5 MG tablet Take 15 mg by mouth every Sunday.     Multiple Vitamins-Minerals (MULTIVITAMIN GUMMIES ADULT) CHEW Chew 1 tablet by mouth daily.     omeprazole (PRILOSEC) 20 MG capsule Take 20 mg by mouth daily as needed (heartburn).     oxyCODONE-acetaminophen (PERCOCET) 10-325 MG tablet Take 1 tablet by mouth every 6 (six) hours as needed for pain. 20 tablet 0   polyethylene glycol (MIRALAX / GLYCOLAX) 17 g packet Take 17 g by mouth daily as needed for mild constipation. 14 each 0   predniSONE (DELTASONE) 5 MG tablet Take 1 tablet (5 mg total) by mouth daily.     sertraline (ZOLOFT) 50 MG tablet Take 50 mg by mouth daily.     vitamin B-12 (CYANOCOBALAMIN) 1000 MCG tablet Take 1,000 mcg by mouth daily.     No facility-administered medications prior to visit.    Review of Systems  Constitutional:  Negative for chills, diaphoresis, fever, malaise/fatigue and weight loss.  HENT:  Negative for congestion.   Respiratory:  Negative for cough, hemoptysis, sputum production, shortness of breath and wheezing.   Cardiovascular:  Negative for chest pain, palpitations and leg swelling.     Objective:   Vitals:   07/26/21 1332  BP: 118/62  Pulse: 77  Temp: 98.6 F (37 C)  TempSrc: Oral  SpO2: 92%  Weight: 134 lb 8 oz (61 kg)   SpO2: 92 % O2 Device: Nasal cannula O2 Flow Rate (L/min): 2 L/min  Physical Exam: General: Well-appearing, no acute distress HENT: [place] [employer], AT Eyes: EOMI, no scleral icterus Respiratory: Clear to auscultation bilaterally.  No crackles, wheezing or rales Cardiovascular: RRR, -M/R/G, no JVD Extremities:-Edema,-tenderness Neuro: AAO x4, CNII-XII grossly intact Psych: Normal mood, normal affect  Data  Reviewed:  Imaging: CXR 06/28/21 - Bilateral patchy pneumonia CXR 07/26/21 - Improving bilateral interstitial pneumonia  PFT: None on file  Labs: CBC    Component Value Date/Time   WBC 10.4 07/01/2021 0239   RBC 3.10 (L) 07/01/2021 0239   HGB 8.8 (L) 07/01/2021 0239   HCT 27.9 (L) 07/01/2021 0239   PLT 309 07/01/2021 0239   MCV 90.0 07/01/2021 0239   MCH 28.4 07/01/2021 0239  MCHC 31.5 07/01/2021 0239   RDW 17.1 (H) 07/01/2021 0239   LYMPHSABS 0.6 (L) 06/28/2021 0541   MONOABS 0.1 06/28/2021 0541   EOSABS 0.1 06/28/2021 0541   BASOSABS 0.0 06/28/2021 0541        Assessment & Plan:   Discussion: 88 year old female with RA on immunosuppression including chronic prednisone, HTN, HLD, hypothyroidism, GERD and anxiety who presents for consultation for recurrent pneumonia. Reviewed hospital course. Current improving symptoms. Remain on oxygen. Family states she has not had any pneumonia-related vaccines in >10 years.  Community-acquired pneumonia Complete levaquin Wear oxygen for goal SpO2 >88% Wear 2L via  with activity and sleep. Wean as tolerated Recommend pneumococcal vaccine  Health Maintenance  There is no immunization history on file for this patient. CT Lung Screen - assess tobacco history at next visit  Orders Placed This Encounter  Procedures   DG Chest 2 View    Standing Status:   Future    Number of Occurrences:   1    Standing Expiration Date:   07/27/2022    Order Specific Question:   Reason for Exam (SYMPTOM  OR DIAGNOSIS REQUIRED)    Answer:   rule out heart failure. PNA    Order Specific Question:   Preferred imaging location?    Answer:   Internal  No orders of the defined types were placed in this encounter.   Return in about 2 months (around 09/26/2021).  I have spent a total time of 45-minutes on the day of the appointment reviewing prior documentation, coordinating care and discussing medical diagnosis and plan with the patient/family.  Imaging, labs and tests included in this note have been reviewed and interpreted independently by me.  [place], MD [facility] 07/29/2021  Office Number [phone]

## 2021-07-29 ENCOUNTER — Encounter: Payer: Self-pay | Admitting: Pulmonary Disease

## 2021-07-30 DIAGNOSIS — I10 Essential (primary) hypertension: Secondary | ICD-10-CM | POA: Diagnosis not present

## 2021-07-30 DIAGNOSIS — M810 Age-related osteoporosis without current pathological fracture: Secondary | ICD-10-CM | POA: Diagnosis not present

## 2021-07-30 DIAGNOSIS — R600 Localized edema: Secondary | ICD-10-CM | POA: Diagnosis not present

## 2021-07-30 DIAGNOSIS — R0989 Other specified symptoms and signs involving the circulatory and respiratory systems: Secondary | ICD-10-CM | POA: Diagnosis not present

## 2021-07-30 DIAGNOSIS — K5909 Other constipation: Secondary | ICD-10-CM | POA: Diagnosis not present

## 2021-07-30 DIAGNOSIS — R5381 Other malaise: Secondary | ICD-10-CM | POA: Diagnosis not present

## 2021-07-30 DIAGNOSIS — J189 Pneumonia, unspecified organism: Secondary | ICD-10-CM | POA: Diagnosis not present

## 2021-07-30 DIAGNOSIS — K219 Gastro-esophageal reflux disease without esophagitis: Secondary | ICD-10-CM | POA: Diagnosis not present

## 2021-07-30 DIAGNOSIS — G894 Chronic pain syndrome: Secondary | ICD-10-CM | POA: Diagnosis not present

## 2021-07-30 DIAGNOSIS — E559 Vitamin D deficiency, unspecified: Secondary | ICD-10-CM | POA: Diagnosis not present

## 2021-07-30 DIAGNOSIS — D649 Anemia, unspecified: Secondary | ICD-10-CM | POA: Diagnosis not present

## 2021-08-15 DIAGNOSIS — I10 Essential (primary) hypertension: Secondary | ICD-10-CM | POA: Diagnosis not present

## 2021-08-15 DIAGNOSIS — R0989 Other specified symptoms and signs involving the circulatory and respiratory systems: Secondary | ICD-10-CM | POA: Diagnosis not present

## 2021-08-15 DIAGNOSIS — G894 Chronic pain syndrome: Secondary | ICD-10-CM | POA: Diagnosis not present

## 2021-08-15 DIAGNOSIS — R600 Localized edema: Secondary | ICD-10-CM | POA: Diagnosis not present

## 2021-08-15 DIAGNOSIS — R5381 Other malaise: Secondary | ICD-10-CM | POA: Diagnosis not present

## 2021-08-15 DIAGNOSIS — J189 Pneumonia, unspecified organism: Secondary | ICD-10-CM | POA: Diagnosis not present

## 2021-08-15 DIAGNOSIS — K5900 Constipation, unspecified: Secondary | ICD-10-CM | POA: Diagnosis not present

## 2021-08-20 DIAGNOSIS — M1990 Unspecified osteoarthritis, unspecified site: Secondary | ICD-10-CM | POA: Diagnosis not present

## 2021-08-20 DIAGNOSIS — E785 Hyperlipidemia, unspecified: Secondary | ICD-10-CM | POA: Diagnosis not present

## 2021-08-20 DIAGNOSIS — M810 Age-related osteoporosis without current pathological fracture: Secondary | ICD-10-CM | POA: Diagnosis not present

## 2021-08-20 DIAGNOSIS — J189 Pneumonia, unspecified organism: Secondary | ICD-10-CM | POA: Diagnosis not present

## 2021-08-20 DIAGNOSIS — I10 Essential (primary) hypertension: Secondary | ICD-10-CM | POA: Diagnosis not present

## 2021-08-20 DIAGNOSIS — Z9181 History of falling: Secondary | ICD-10-CM | POA: Diagnosis not present

## 2021-08-20 DIAGNOSIS — Z7952 Long term (current) use of systemic steroids: Secondary | ICD-10-CM | POA: Diagnosis not present

## 2021-08-20 DIAGNOSIS — E871 Hypo-osmolality and hyponatremia: Secondary | ICD-10-CM | POA: Diagnosis not present

## 2021-08-20 DIAGNOSIS — M069 Rheumatoid arthritis, unspecified: Secondary | ICD-10-CM | POA: Diagnosis not present

## 2021-08-20 DIAGNOSIS — E559 Vitamin D deficiency, unspecified: Secondary | ICD-10-CM | POA: Diagnosis not present

## 2021-08-20 DIAGNOSIS — D649 Anemia, unspecified: Secondary | ICD-10-CM | POA: Diagnosis not present

## 2021-08-20 DIAGNOSIS — F410 Panic disorder [episodic paroxysmal anxiety] without agoraphobia: Secondary | ICD-10-CM | POA: Diagnosis not present

## 2021-08-20 DIAGNOSIS — Z87440 Personal history of urinary (tract) infections: Secondary | ICD-10-CM | POA: Diagnosis not present

## 2021-08-20 DIAGNOSIS — E890 Postprocedural hypothyroidism: Secondary | ICD-10-CM | POA: Diagnosis not present

## 2021-08-20 DIAGNOSIS — E875 Hyperkalemia: Secondary | ICD-10-CM | POA: Diagnosis not present

## 2021-08-20 DIAGNOSIS — M542 Cervicalgia: Secondary | ICD-10-CM | POA: Diagnosis not present

## 2021-08-20 DIAGNOSIS — F32A Depression, unspecified: Secondary | ICD-10-CM | POA: Diagnosis not present

## 2021-08-20 DIAGNOSIS — G894 Chronic pain syndrome: Secondary | ICD-10-CM | POA: Diagnosis not present

## 2021-08-20 DIAGNOSIS — J9621 Acute and chronic respiratory failure with hypoxia: Secondary | ICD-10-CM | POA: Diagnosis not present

## 2021-08-20 DIAGNOSIS — K5900 Constipation, unspecified: Secondary | ICD-10-CM | POA: Diagnosis not present

## 2021-08-20 DIAGNOSIS — Z79891 Long term (current) use of opiate analgesic: Secondary | ICD-10-CM | POA: Diagnosis not present

## 2021-08-20 DIAGNOSIS — M549 Dorsalgia, unspecified: Secondary | ICD-10-CM | POA: Diagnosis not present

## 2021-08-20 DIAGNOSIS — K219 Gastro-esophageal reflux disease without esophagitis: Secondary | ICD-10-CM | POA: Diagnosis not present

## 2021-08-23 DIAGNOSIS — J189 Pneumonia, unspecified organism: Secondary | ICD-10-CM | POA: Diagnosis not present

## 2021-08-23 DIAGNOSIS — M069 Rheumatoid arthritis, unspecified: Secondary | ICD-10-CM | POA: Diagnosis not present

## 2021-08-23 DIAGNOSIS — M810 Age-related osteoporosis without current pathological fracture: Secondary | ICD-10-CM | POA: Diagnosis not present

## 2021-08-23 DIAGNOSIS — J9621 Acute and chronic respiratory failure with hypoxia: Secondary | ICD-10-CM | POA: Diagnosis not present

## 2021-08-23 DIAGNOSIS — G894 Chronic pain syndrome: Secondary | ICD-10-CM | POA: Diagnosis not present

## 2021-08-23 DIAGNOSIS — I10 Essential (primary) hypertension: Secondary | ICD-10-CM | POA: Diagnosis not present

## 2021-08-28 DIAGNOSIS — R2689 Other abnormalities of gait and mobility: Secondary | ICD-10-CM | POA: Diagnosis not present

## 2021-08-28 DIAGNOSIS — M069 Rheumatoid arthritis, unspecified: Secondary | ICD-10-CM | POA: Diagnosis not present

## 2021-08-28 DIAGNOSIS — Z9981 Dependence on supplemental oxygen: Secondary | ICD-10-CM | POA: Diagnosis not present

## 2021-08-28 DIAGNOSIS — Z8701 Personal history of pneumonia (recurrent): Secondary | ICD-10-CM | POA: Diagnosis not present

## 2021-08-28 DIAGNOSIS — I119 Hypertensive heart disease without heart failure: Secondary | ICD-10-CM | POA: Diagnosis not present

## 2021-08-28 DIAGNOSIS — R600 Localized edema: Secondary | ICD-10-CM | POA: Diagnosis not present

## 2021-08-28 DIAGNOSIS — G894 Chronic pain syndrome: Secondary | ICD-10-CM | POA: Diagnosis not present

## 2021-08-29 DIAGNOSIS — J189 Pneumonia, unspecified organism: Secondary | ICD-10-CM | POA: Diagnosis not present

## 2021-08-29 DIAGNOSIS — I10 Essential (primary) hypertension: Secondary | ICD-10-CM | POA: Diagnosis not present

## 2021-08-29 DIAGNOSIS — M810 Age-related osteoporosis without current pathological fracture: Secondary | ICD-10-CM | POA: Diagnosis not present

## 2021-08-29 DIAGNOSIS — G894 Chronic pain syndrome: Secondary | ICD-10-CM | POA: Diagnosis not present

## 2021-08-29 DIAGNOSIS — J9621 Acute and chronic respiratory failure with hypoxia: Secondary | ICD-10-CM | POA: Diagnosis not present

## 2021-08-29 DIAGNOSIS — M069 Rheumatoid arthritis, unspecified: Secondary | ICD-10-CM | POA: Diagnosis not present

## 2021-08-30 DIAGNOSIS — G894 Chronic pain syndrome: Secondary | ICD-10-CM | POA: Diagnosis not present

## 2021-08-30 DIAGNOSIS — I10 Essential (primary) hypertension: Secondary | ICD-10-CM | POA: Diagnosis not present

## 2021-08-30 DIAGNOSIS — M810 Age-related osteoporosis without current pathological fracture: Secondary | ICD-10-CM | POA: Diagnosis not present

## 2021-08-30 DIAGNOSIS — J189 Pneumonia, unspecified organism: Secondary | ICD-10-CM | POA: Diagnosis not present

## 2021-08-30 DIAGNOSIS — J9621 Acute and chronic respiratory failure with hypoxia: Secondary | ICD-10-CM | POA: Diagnosis not present

## 2021-08-30 DIAGNOSIS — M069 Rheumatoid arthritis, unspecified: Secondary | ICD-10-CM | POA: Diagnosis not present

## 2021-08-31 DIAGNOSIS — I10 Essential (primary) hypertension: Secondary | ICD-10-CM | POA: Diagnosis not present

## 2021-08-31 DIAGNOSIS — J9621 Acute and chronic respiratory failure with hypoxia: Secondary | ICD-10-CM | POA: Diagnosis not present

## 2021-08-31 DIAGNOSIS — G894 Chronic pain syndrome: Secondary | ICD-10-CM | POA: Diagnosis not present

## 2021-08-31 DIAGNOSIS — J189 Pneumonia, unspecified organism: Secondary | ICD-10-CM | POA: Diagnosis not present

## 2021-08-31 DIAGNOSIS — M069 Rheumatoid arthritis, unspecified: Secondary | ICD-10-CM | POA: Diagnosis not present

## 2021-08-31 DIAGNOSIS — M810 Age-related osteoporosis without current pathological fracture: Secondary | ICD-10-CM | POA: Diagnosis not present

## 2021-09-03 DIAGNOSIS — M069 Rheumatoid arthritis, unspecified: Secondary | ICD-10-CM | POA: Diagnosis not present

## 2021-09-03 DIAGNOSIS — I10 Essential (primary) hypertension: Secondary | ICD-10-CM | POA: Diagnosis not present

## 2021-09-03 DIAGNOSIS — G894 Chronic pain syndrome: Secondary | ICD-10-CM | POA: Diagnosis not present

## 2021-09-03 DIAGNOSIS — M810 Age-related osteoporosis without current pathological fracture: Secondary | ICD-10-CM | POA: Diagnosis not present

## 2021-09-03 DIAGNOSIS — J9621 Acute and chronic respiratory failure with hypoxia: Secondary | ICD-10-CM | POA: Diagnosis not present

## 2021-09-03 DIAGNOSIS — J189 Pneumonia, unspecified organism: Secondary | ICD-10-CM | POA: Diagnosis not present

## 2021-09-05 DIAGNOSIS — M810 Age-related osteoporosis without current pathological fracture: Secondary | ICD-10-CM | POA: Diagnosis not present

## 2021-09-05 DIAGNOSIS — J9621 Acute and chronic respiratory failure with hypoxia: Secondary | ICD-10-CM | POA: Diagnosis not present

## 2021-09-05 DIAGNOSIS — G894 Chronic pain syndrome: Secondary | ICD-10-CM | POA: Diagnosis not present

## 2021-09-05 DIAGNOSIS — M069 Rheumatoid arthritis, unspecified: Secondary | ICD-10-CM | POA: Diagnosis not present

## 2021-09-05 DIAGNOSIS — I10 Essential (primary) hypertension: Secondary | ICD-10-CM | POA: Diagnosis not present

## 2021-09-05 DIAGNOSIS — J189 Pneumonia, unspecified organism: Secondary | ICD-10-CM | POA: Diagnosis not present

## 2021-09-07 DIAGNOSIS — J189 Pneumonia, unspecified organism: Secondary | ICD-10-CM | POA: Diagnosis not present

## 2021-09-07 DIAGNOSIS — M810 Age-related osteoporosis without current pathological fracture: Secondary | ICD-10-CM | POA: Diagnosis not present

## 2021-09-07 DIAGNOSIS — J9621 Acute and chronic respiratory failure with hypoxia: Secondary | ICD-10-CM | POA: Diagnosis not present

## 2021-09-07 DIAGNOSIS — G894 Chronic pain syndrome: Secondary | ICD-10-CM | POA: Diagnosis not present

## 2021-09-07 DIAGNOSIS — M069 Rheumatoid arthritis, unspecified: Secondary | ICD-10-CM | POA: Diagnosis not present

## 2021-09-07 DIAGNOSIS — I10 Essential (primary) hypertension: Secondary | ICD-10-CM | POA: Diagnosis not present

## 2021-09-14 DIAGNOSIS — I10 Essential (primary) hypertension: Secondary | ICD-10-CM | POA: Diagnosis not present

## 2021-09-14 DIAGNOSIS — M069 Rheumatoid arthritis, unspecified: Secondary | ICD-10-CM | POA: Diagnosis not present

## 2021-09-14 DIAGNOSIS — M810 Age-related osteoporosis without current pathological fracture: Secondary | ICD-10-CM | POA: Diagnosis not present

## 2021-09-14 DIAGNOSIS — J189 Pneumonia, unspecified organism: Secondary | ICD-10-CM | POA: Diagnosis not present

## 2021-09-14 DIAGNOSIS — J9621 Acute and chronic respiratory failure with hypoxia: Secondary | ICD-10-CM | POA: Diagnosis not present

## 2021-09-14 DIAGNOSIS — G894 Chronic pain syndrome: Secondary | ICD-10-CM | POA: Diagnosis not present

## 2021-09-18 DIAGNOSIS — J189 Pneumonia, unspecified organism: Secondary | ICD-10-CM | POA: Diagnosis not present

## 2021-09-18 DIAGNOSIS — M810 Age-related osteoporosis without current pathological fracture: Secondary | ICD-10-CM | POA: Diagnosis not present

## 2021-09-18 DIAGNOSIS — I10 Essential (primary) hypertension: Secondary | ICD-10-CM | POA: Diagnosis not present

## 2021-09-18 DIAGNOSIS — M069 Rheumatoid arthritis, unspecified: Secondary | ICD-10-CM | POA: Diagnosis not present

## 2021-09-18 DIAGNOSIS — G894 Chronic pain syndrome: Secondary | ICD-10-CM | POA: Diagnosis not present

## 2021-09-18 DIAGNOSIS — J9621 Acute and chronic respiratory failure with hypoxia: Secondary | ICD-10-CM | POA: Diagnosis not present

## 2021-09-27 ENCOUNTER — Ambulatory Visit: Payer: BLUE CROSS/BLUE SHIELD | Admitting: Pulmonary Disease

## 2021-10-10 DIAGNOSIS — E559 Vitamin D deficiency, unspecified: Secondary | ICD-10-CM | POA: Diagnosis not present

## 2021-10-10 DIAGNOSIS — E785 Hyperlipidemia, unspecified: Secondary | ICD-10-CM | POA: Diagnosis not present

## 2021-10-10 DIAGNOSIS — R7989 Other specified abnormal findings of blood chemistry: Secondary | ICD-10-CM | POA: Diagnosis not present

## 2021-10-10 DIAGNOSIS — I10 Essential (primary) hypertension: Secondary | ICD-10-CM | POA: Diagnosis not present

## 2021-10-10 DIAGNOSIS — E039 Hypothyroidism, unspecified: Secondary | ICD-10-CM | POA: Diagnosis not present

## 2021-10-17 DIAGNOSIS — F410 Panic disorder [episodic paroxysmal anxiety] without agoraphobia: Secondary | ICD-10-CM | POA: Diagnosis not present

## 2021-10-17 DIAGNOSIS — Z79899 Other long term (current) drug therapy: Secondary | ICD-10-CM | POA: Diagnosis not present

## 2021-10-17 DIAGNOSIS — R0602 Shortness of breath: Secondary | ICD-10-CM | POA: Diagnosis not present

## 2021-10-17 DIAGNOSIS — Z1331 Encounter for screening for depression: Secondary | ICD-10-CM | POA: Diagnosis not present

## 2021-10-17 DIAGNOSIS — I10 Essential (primary) hypertension: Secondary | ICD-10-CM | POA: Diagnosis not present

## 2021-10-17 DIAGNOSIS — J441 Chronic obstructive pulmonary disease with (acute) exacerbation: Secondary | ICD-10-CM | POA: Diagnosis not present

## 2021-10-17 DIAGNOSIS — D8989 Other specified disorders involving the immune mechanism, not elsewhere classified: Secondary | ICD-10-CM | POA: Diagnosis not present

## 2021-10-17 DIAGNOSIS — E785 Hyperlipidemia, unspecified: Secondary | ICD-10-CM | POA: Diagnosis not present

## 2021-10-17 DIAGNOSIS — Z0000 Encounter for general adult medical examination without abnormal findings: Secondary | ICD-10-CM | POA: Diagnosis not present

## 2021-10-17 DIAGNOSIS — Z1339 Encounter for screening examination for other mental health and behavioral disorders: Secondary | ICD-10-CM | POA: Diagnosis not present

## 2021-10-17 DIAGNOSIS — I87311 Chronic venous hypertension (idiopathic) with ulcer of right lower extremity: Secondary | ICD-10-CM | POA: Diagnosis not present

## 2021-10-17 DIAGNOSIS — E876 Hypokalemia: Secondary | ICD-10-CM | POA: Diagnosis not present

## 2021-10-17 DIAGNOSIS — R82998 Other abnormal findings in urine: Secondary | ICD-10-CM | POA: Diagnosis not present

## 2021-10-17 DIAGNOSIS — I119 Hypertensive heart disease without heart failure: Secondary | ICD-10-CM | POA: Diagnosis not present

## 2021-10-17 DIAGNOSIS — G459 Transient cerebral ischemic attack, unspecified: Secondary | ICD-10-CM | POA: Diagnosis not present

## 2021-10-17 DIAGNOSIS — E669 Obesity, unspecified: Secondary | ICD-10-CM | POA: Diagnosis not present

## 2021-10-17 DIAGNOSIS — M069 Rheumatoid arthritis, unspecified: Secondary | ICD-10-CM | POA: Diagnosis not present

## 2021-10-22 DIAGNOSIS — J441 Chronic obstructive pulmonary disease with (acute) exacerbation: Secondary | ICD-10-CM | POA: Diagnosis not present

## 2021-10-22 DIAGNOSIS — E876 Hypokalemia: Secondary | ICD-10-CM | POA: Diagnosis not present

## 2021-10-22 DIAGNOSIS — R0602 Shortness of breath: Secondary | ICD-10-CM | POA: Diagnosis not present

## 2021-10-22 DIAGNOSIS — J101 Influenza due to other identified influenza virus with other respiratory manifestations: Secondary | ICD-10-CM | POA: Diagnosis not present

## 2021-10-22 DIAGNOSIS — D8989 Other specified disorders involving the immune mechanism, not elsewhere classified: Secondary | ICD-10-CM | POA: Diagnosis not present

## 2021-10-22 DIAGNOSIS — W5503XA Scratched by cat, initial encounter: Secondary | ICD-10-CM | POA: Diagnosis not present

## 2021-10-22 DIAGNOSIS — B370 Candidal stomatitis: Secondary | ICD-10-CM | POA: Diagnosis not present

## 2021-10-22 DIAGNOSIS — J029 Acute pharyngitis, unspecified: Secondary | ICD-10-CM | POA: Diagnosis not present

## 2021-10-22 DIAGNOSIS — Z1152 Encounter for screening for COVID-19: Secondary | ICD-10-CM | POA: Diagnosis not present

## 2021-11-02 ENCOUNTER — Emergency Department (HOSPITAL_COMMUNITY): Payer: Medicare Other

## 2021-11-02 ENCOUNTER — Emergency Department (HOSPITAL_COMMUNITY)
Admission: EM | Admit: 2021-11-02 | Discharge: 2021-11-07 | Disposition: A | Discharge status: Skilled Nursing Facility | Payer: Medicare Other | Attending: Emergency Medicine | Admitting: Emergency Medicine

## 2021-11-02 ENCOUNTER — Encounter (HOSPITAL_COMMUNITY): Payer: Self-pay | Admitting: Emergency Medicine

## 2021-11-02 DIAGNOSIS — R000 Tachycardia, unspecified: Secondary | ICD-10-CM | POA: Diagnosis not present

## 2021-11-02 DIAGNOSIS — Z79899 Other long term (current) drug therapy: Secondary | ICD-10-CM | POA: Diagnosis not present

## 2021-11-02 DIAGNOSIS — J439 Emphysema, unspecified: Secondary | ICD-10-CM | POA: Diagnosis not present

## 2021-11-02 DIAGNOSIS — Z9181 History of falling: Secondary | ICD-10-CM | POA: Diagnosis not present

## 2021-11-02 DIAGNOSIS — S3210XA Unspecified fracture of sacrum, initial encounter for closed fracture: Secondary | ICD-10-CM | POA: Diagnosis not present

## 2021-11-02 DIAGNOSIS — Z7982 Long term (current) use of aspirin: Secondary | ICD-10-CM | POA: Diagnosis not present

## 2021-11-02 DIAGNOSIS — F0390 Unspecified dementia without behavioral disturbance: Secondary | ICD-10-CM | POA: Diagnosis not present

## 2021-11-02 DIAGNOSIS — S322XXA Fracture of coccyx, initial encounter for closed fracture: Secondary | ICD-10-CM | POA: Diagnosis not present

## 2021-11-02 DIAGNOSIS — M549 Dorsalgia, unspecified: Secondary | ICD-10-CM | POA: Diagnosis not present

## 2021-11-02 DIAGNOSIS — S3992XA Unspecified injury of lower back, initial encounter: Secondary | ICD-10-CM | POA: Diagnosis present

## 2021-11-02 DIAGNOSIS — R531 Weakness: Secondary | ICD-10-CM | POA: Insufficient documentation

## 2021-11-02 DIAGNOSIS — I10 Essential (primary) hypertension: Secondary | ICD-10-CM | POA: Diagnosis not present

## 2021-11-02 DIAGNOSIS — W19XXXA Unspecified fall, initial encounter: Secondary | ICD-10-CM | POA: Diagnosis not present

## 2021-11-02 DIAGNOSIS — Z20822 Contact with and (suspected) exposure to covid-19: Secondary | ICD-10-CM | POA: Diagnosis not present

## 2021-11-02 DIAGNOSIS — R053 Chronic cough: Secondary | ICD-10-CM | POA: Insufficient documentation

## 2021-11-02 DIAGNOSIS — R2689 Other abnormalities of gait and mobility: Secondary | ICD-10-CM | POA: Insufficient documentation

## 2021-11-02 DIAGNOSIS — R079 Chest pain, unspecified: Secondary | ICD-10-CM | POA: Diagnosis not present

## 2021-11-02 DIAGNOSIS — R296 Repeated falls: Secondary | ICD-10-CM

## 2021-11-02 DIAGNOSIS — N200 Calculus of kidney: Secondary | ICD-10-CM | POA: Diagnosis not present

## 2021-11-02 LAB — COMPREHENSIVE METABOLIC PANEL
ALT: 13 U/L (ref 0–44)
AST: 21 U/L (ref 15–41)
Albumin: 3.7 g/dL (ref 3.5–5.0)
Alkaline Phosphatase: 72 U/L (ref 38–126)
Anion gap: 10 (ref 5–15)
BUN: 13 mg/dL (ref 8–23)
CO2: 24 mmol/L (ref 22–32)
Calcium: 9.4 mg/dL (ref 8.9–10.3)
Chloride: 101 mmol/L (ref 98–111)
Creatinine, Ser: 1.05 mg/dL — ABNORMAL HIGH (ref 0.44–1.00)
GFR, Estimated: 50 mL/min — ABNORMAL LOW (ref >60)
Glucose, Bld: 119 mg/dL — ABNORMAL HIGH (ref 70–99)
Potassium: 3.7 mmol/L (ref 3.5–5.1)
Sodium: 135 mmol/L (ref 135–145)
Total Bilirubin: 0.4 mg/dL (ref 0.3–1.2)
Total Protein: 7.5 g/dL (ref 6.5–8.1)

## 2021-11-02 LAB — CBC
HCT: 35.3 % — ABNORMAL LOW (ref 36.0–46.0)
Hemoglobin: 10.8 g/dL — ABNORMAL LOW (ref 12.0–15.0)
MCH: 28 pg (ref 26.0–34.0)
MCHC: 30.6 g/dL (ref 30.0–36.0)
MCV: 91.5 fL (ref 80.0–100.0)
Platelets: 622 10*3/uL — ABNORMAL HIGH (ref 150–400)
RBC: 3.86 MIL/uL — ABNORMAL LOW (ref 3.87–5.11)
RDW: 16 % — ABNORMAL HIGH (ref 11.5–15.5)
WBC: 8.6 10*3/uL (ref 4.0–10.5)
nRBC: 0 % (ref 0.0–0.2)

## 2021-11-02 LAB — RESP PANEL BY RT-PCR (FLU A&B, COVID) ARPGX2
Influenza A by PCR: NEGATIVE
Influenza B by PCR: NEGATIVE
SARS Coronavirus 2 by RT PCR: NEGATIVE

## 2021-11-02 LAB — LIPASE, BLOOD: Lipase: 27 U/L (ref 11–51)

## 2021-11-02 LAB — LACTIC ACID, PLASMA: Lactic Acid, Venous: 2 mmol/L (ref 0.5–1.9)

## 2021-11-02 MED ORDER — FENTANYL 50 MCG/HR TD PT72
1.0000 | MEDICATED_PATCH | TRANSDERMAL | Status: DC
  Administered 2021-11-03 – 2021-11-06 (×2): 1 via TRANSDERMAL
  Filled 2021-11-02 (×2): qty 1

## 2021-11-02 MED ORDER — GUAIFENESIN ER 600 MG PO TB12: 600.0000 mg | ORAL_TABLET | Freq: Two times a day (BID) | ORAL | Status: DC | PRN

## 2021-11-02 MED ORDER — ASPIRIN 81 MG PO TBEC
81.0000 mg | DELAYED_RELEASE_TABLET | Freq: Every day | ORAL | Status: DC
  Administered 2021-11-02 – 2021-11-07 (×6): 81 mg via ORAL
  Filled 2021-11-02 (×6): qty 1

## 2021-11-02 MED ORDER — ACETAMINOPHEN 325 MG PO TABS
650.0000 mg | ORAL_TABLET | Freq: Three times a day (TID) | ORAL | Status: DC
  Administered 2021-11-03 – 2021-11-07 (×11): 650 mg via ORAL
  Filled 2021-11-02 (×11): qty 2

## 2021-11-02 MED ORDER — VITAMIN D 25 MCG (1000 UNIT) PO TABS
3000.0000 [IU] | ORAL_TABLET | Freq: Every day | ORAL | Status: DC
  Administered 2021-11-02 – 2021-11-07 (×6): 3000 [IU] via ORAL
  Filled 2021-11-02 (×6): qty 3

## 2021-11-02 MED ORDER — METHOTREXATE 2.5 MG PO TABS
15.0000 mg | ORAL_TABLET | ORAL | Status: DC
  Filled 2021-11-02: qty 6

## 2021-11-02 MED ORDER — ALBUTEROL SULFATE (2.5 MG/3ML) 0.083% IN NEBU: 2.5000 mg | INHALATION_SOLUTION | RESPIRATORY_TRACT | Status: DC | PRN

## 2021-11-02 MED ORDER — PREDNISONE 5 MG PO TABS
5.0000 mg | ORAL_TABLET | Freq: Every day | ORAL | Status: DC
  Administered 2021-11-02 – 2021-11-07 (×6): 5 mg via ORAL
  Filled 2021-11-02 (×6): qty 1

## 2021-11-02 MED ORDER — VITAMIN B-12 1000 MCG PO TABS
1000.0000 ug | ORAL_TABLET | Freq: Every day | ORAL | Status: DC
  Administered 2021-11-02 – 2021-11-07 (×6): 1000 ug via ORAL
  Filled 2021-11-02 (×6): qty 1

## 2021-11-02 MED ORDER — PANTOPRAZOLE SODIUM 40 MG PO TBEC
40.0000 mg | DELAYED_RELEASE_TABLET | Freq: Every day | ORAL | Status: DC
  Administered 2021-11-02 – 2021-11-07 (×6): 40 mg via ORAL
  Filled 2021-11-02 (×6): qty 1

## 2021-11-02 MED ORDER — FOLIC ACID 1 MG PO TABS
1.0000 mg | ORAL_TABLET | Freq: Every day | ORAL | Status: DC
  Administered 2021-11-02 – 2021-11-07 (×6): 1 mg via ORAL
  Filled 2021-11-02 (×6): qty 1

## 2021-11-02 MED ORDER — AMLODIPINE BESYLATE 5 MG PO TABS
5.0000 mg | ORAL_TABLET | Freq: Every day | ORAL | Status: DC
  Administered 2021-11-02 – 2021-11-07 (×6): 5 mg via ORAL
  Filled 2021-11-02 (×6): qty 1

## 2021-11-02 MED ORDER — BENAZEPRIL HCL 20 MG PO TABS
20.0000 mg | ORAL_TABLET | Freq: Every morning | ORAL | Status: DC
  Administered 2021-11-03 – 2021-11-06 (×3): 20 mg via ORAL
  Filled 2021-11-02 (×5): qty 1

## 2021-11-02 MED ORDER — DOCUSATE SODIUM 100 MG PO CAPS
200.0000 mg | ORAL_CAPSULE | Freq: Two times a day (BID) | ORAL | Status: DC
  Administered 2021-11-03 – 2021-11-06 (×9): 200 mg via ORAL
  Filled 2021-11-02 (×9): qty 2

## 2021-11-02 MED ORDER — OXYCODONE HCL 5 MG PO TABS
5.0000 mg | ORAL_TABLET | Freq: Four times a day (QID) | ORAL | Status: DC | PRN
  Administered 2021-11-02 – 2021-11-06 (×4): 5 mg via ORAL
  Filled 2021-11-02 (×4): qty 1

## 2021-11-02 MED ORDER — LORAZEPAM 0.5 MG PO TABS
0.5000 mg | ORAL_TABLET | Freq: Two times a day (BID) | ORAL | Status: DC
  Administered 2021-11-03 – 2021-11-07 (×10): 0.5 mg via ORAL
  Filled 2021-11-02 (×10): qty 1

## 2021-11-02 MED ORDER — OXYCODONE-ACETAMINOPHEN 5-325 MG PO TABS
1.0000 | ORAL_TABLET | Freq: Four times a day (QID) | ORAL | Status: DC | PRN
  Administered 2021-11-02 – 2021-11-07 (×6): 1 via ORAL
  Filled 2021-11-02 (×6): qty 1

## 2021-11-02 MED ORDER — SERTRALINE HCL 50 MG PO TABS
50.0000 mg | ORAL_TABLET | Freq: Every day | ORAL | Status: DC
  Administered 2021-11-02 – 2021-11-07 (×6): 50 mg via ORAL
  Filled 2021-11-02 (×6): qty 1

## 2021-11-02 MED ORDER — LEVOTHYROXINE SODIUM 25 MCG PO TABS
125.0000 ug | ORAL_TABLET | Freq: Every day | ORAL | Status: DC
  Administered 2021-11-03 – 2021-11-07 (×5): 125 ug via ORAL
  Filled 2021-11-02 (×5): qty 1

## 2021-11-02 MED ORDER — OXYCODONE-ACETAMINOPHEN 10-325 MG PO TABS: 1.0000 | ORAL_TABLET | Freq: Four times a day (QID) | ORAL | Status: DC | PRN

## 2021-11-02 MED ORDER — LEVOTHYROXINE SODIUM 25 MCG PO TABS: 125.0000 ug | ORAL_TABLET | Freq: Every day | ORAL | Status: DC

## 2021-11-02 NOTE — ED Triage Notes (Signed)
Patient BIB [facility] from home for evaluation of lower back pain, denies recent injury. Called PCP and was prescribed muscle relaxers but they did not help. Patient wears 2L O2 [place] at baseline. Patient is alert, oriented, and in no apparent distress at this time.

## 2021-11-02 NOTE — ED Provider Triage Note (Signed)
Emergency Medicine Provider Triage Evaluation Note  IRJA WHELESS , a 88 y.o. female  was evaluated in triage.  Pt complains of tailbone pain. She states she's had a few falls but can't tell me when her last fall was. She states no head injury.   States she normally able to walk w walker.   No nausea or vomiting.   Review of Systems  Positive: Back pain, urinary frequency/dysuria.  Negative: Fever   Physical Exam  BP (!) 155/81 (BP Location: Right Arm)   Pulse (!) 109   Temp 98.7 F (37.1 C)   Resp 16   SpO2 93%  Gen:   Awake, appears uncomfortable.    Resp:  Normal effort  MSK:   Moves extremities without difficulty  Other:  Abd is soft and TTP diffusely more focally in upper abd.   Medical Decision Making  Medically screening exam initiated at 3:19 PM.  Appropriate orders placed.  [name] was informed that the remainder of the evaluation will be completed by another provider, this initial triage assessment does not replace that evaluation, and the importance of remaining in the ED until their evaluation is complete.  Labs, CT, lactic.    Pati Gallo [place], [place] 11/02/21 1521

## 2021-11-02 NOTE — ED Notes (Signed)
Lactic 2.0. MD made aware via secure chat.

## 2021-11-02 NOTE — ED Provider Notes (Signed)
[facility] EMERGENCY DEPARTMENT Provider Note   CSN: [id number] Arrival date & time: 11/02/21  1451     History  Chief Complaint  Patient presents with   Back Pain    Jocelyn Bauer is a 88 y.o. female presenting from home with concern for lower back pain and difficulty walking.  She is accompanied by her family members concluding her daughter, who reports the patient's been complaining of worsening low back pain around her tailbone since Tuesday.  They report the patient has gotten "so weak she cannot walk".  The patient reports this is due to pain.  She ready has a fentanyl patch and takes Percocet at baseline.  She has had multiple falls over the course of the year due to her weakness and frailty.  Her family feels that she is needing placement for full-time assessment for rehab, and that home health is not helping him out enough.  The patient is on chronic oxygen at home.  She has a chronic cough.  Family is concerned that the patient frequently gets pneumonia, which presents as weakness, and request x-rays as well.  HPI     Home Medications Prior to Admission medications   Medication Sig Start Date End Date Taking? Authorizing Provider  acetaminophen (TYLENOL) 325 MG tablet Take 2 tablets (650 mg total) by mouth 3 (three) times daily. 07/29/19   Allie Bossier, MD  acyclovir ointment (ZOVIRAX) 5 % Apply topically 5 (five) times daily. 07/04/21   Barb Merino, MD  albuterol (PROVENTIL) (2.5 MG/3ML) 0.083% nebulizer solution Take 3 mLs (2.5 mg total) by nebulization every 2 (two) hours as needed for wheezing. 03/21/21   Ghimire, Henreitta Leber, MD  amLODipine (NORVASC) 5 MG tablet Take 5 mg by mouth daily.    [provider]  aspirin EC 81 MG tablet Take 1 tablet (81 mg total) by mouth daily. Swallow whole. 03/21/21   Ghimire, Henreitta Leber, MD  benazepril (LOTENSIN) 20 MG tablet Take 20 mg by mouth every morning.    [provider]  bisacodyl  (DULCOLAX) 5 MG EC tablet Take 1 tablet (5 mg total) by mouth daily as needed for moderate constipation. 07/29/19   Allie Bossier, MD  cholecalciferol (VITAMIN D3) 25 MCG (1000 UNIT) tablet Take 3,000 Units by mouth daily.    [provider]  denosumab (PROLIA) 60 MG/ML SOSY injection Inject 60 mg into the skin every 6 (six) months.    [provider]  docusate sodium (COLACE) 100 MG capsule Take 2 capsules (200 mg total) by mouth 2 (two) times daily. Patient taking differently: Take 200 mg by mouth daily as needed for moderate constipation. 07/29/19   Allie Bossier, MD  fentaNYL (DURAGESIC) 50 MCG/HR Place onto the skin every 3 (three) days.    [provider]  folic acid (FOLVITE) 1 MG tablet Take 1 mg by mouth daily.    [provider]  guaiFENesin (MUCINEX) 600 MG 12 hr tablet Take 1 tablet (600 mg total) by mouth 2 (two) times daily as needed for cough. 03/21/21   Ghimire, Henreitta Leber, MD  levothyroxine (SYNTHROID) 125 MCG tablet Take 125 mcg by mouth daily before breakfast.    [provider]  LORazepam (ATIVAN) 0.5 MG tablet Take 0.5 mg by mouth 2 (two) times daily.    [provider]  methotrexate (RHEUMATREX) 2.5 MG tablet Take 15 mg by mouth every Sunday. 06/06/21   [provider]  Multiple Vitamins-Minerals (MULTIVITAMIN GUMMIES ADULT)  CHEW Chew 1 tablet by mouth daily.    [provider]  omeprazole (PRILOSEC) 20 MG capsule Take 20 mg by mouth daily as needed (heartburn). 04/14/21   [provider]  oxyCODONE-acetaminophen (PERCOCET) 10-325 MG tablet Take 1 tablet by mouth every 6 (six) hours as needed for pain. 07/04/21   Barb Merino, MD  polyethylene glycol (MIRALAX / GLYCOLAX) 17 g packet Take 17 g by mouth daily as needed for mild constipation. 03/21/21   Ghimire, Henreitta Leber, MD  predniSONE (DELTASONE) 5 MG tablet Take 1 tablet (5 mg total) by mouth daily. 07/05/21   Barb Merino, MD  sertraline (ZOLOFT) 50  MG tablet Take 50 mg by mouth daily. 06/21/21   [provider]  vitamin B-12 (CYANOCOBALAMIN) 1000 MCG tablet Take 1,000 mcg by mouth daily.    [provider]      Allergies    Contrast media [iodinated contrast media], Tdap [tetanus-diphth-acell pertussis], and Streptococcus (diplococcus) pneumoniae [streptococci]    Review of Systems   Review of Systems  Physical Exam Updated Vital Signs BP 121/76 (BP Location: Right Arm)   Pulse 81   Temp 98.7 F (37.1 C)   Resp 16   SpO2 99%  Physical Exam Constitutional:      General: She is not in acute distress. HENT:     Head: Normocephalic and atraumatic.  Eyes:     Conjunctiva/sclera: Conjunctivae normal.     Pupils: Pupils are equal, round, and reactive to light.  Cardiovascular:     Rate and Rhythm: Normal rate and regular rhythm.  Pulmonary:     Effort: Pulmonary effort is normal. No respiratory distress.     Comments: On home oxygen Abdominal:     General: There is no distension.     Tenderness: There is no abdominal tenderness.  Skin:    General: Skin is warm and dry.  Neurological:     General: No focal deficit present.     Mental Status: She is alert. Mental status is at baseline.     Comments: Midline tenderness over the coccyx  Psychiatric:        Mood and Affect: Mood normal.        Behavior: Behavior normal.     ED Results / Procedures / Treatments   Labs (all labs ordered are listed, but only abnormal results are displayed) Labs Reviewed  COMPREHENSIVE METABOLIC PANEL - Abnormal; Notable for the following components:      Result Value   Glucose, Bld 119 (*)    Creatinine, Ser 1.05 (*)    GFR, Estimated 50 (*)    All other components within normal limits  CBC - Abnormal; Notable for the following components:   RBC 3.86 (*)    Hemoglobin 10.8 (*)    HCT 35.3 (*)    RDW 16.0 (*)    Platelets 622 (*)    All other components within normal limits  LACTIC ACID, PLASMA - Abnormal; Notable  for the following components:   Lactic Acid, Venous 2.0 (*)    All other components within normal limits  CULTURE, BLOOD (ROUTINE X 2)  CULTURE, BLOOD (ROUTINE X 2)  RESP PANEL BY RT-PCR (FLU A&B, COVID) ARPGX2  LIPASE, BLOOD  URINALYSIS, ROUTINE W REFLEX MICROSCOPIC    EKG None  Radiology DG Chest 2 View  Result Date: 11/02/2021 CLINICAL DATA:  Back pain. EXAM: CHEST - 2 VIEW COMPARISON:  07/26/2021 FINDINGS: Lungs are hyperexpanded. Interstitial markings are diffusely coarsened with chronic features.  Cardiopericardial silhouette is at upper limits of normal for size. Prominent skin fold overlies the right lung. Bones are diffusely demineralized. IMPRESSION: Emphysema without acute cardiopulmonary findings. Electronically Signed   By: Misty Stanley M.D.   On: 11/02/2021 18:10   CT ABDOMEN PELVIS WO CONTRAST  Result Date: 11/02/2021 CLINICAL DATA:  Abdominal pain, diffuse left upper quadrant and epigastric pain, sacrococcygeal pain EXAM: CT ABDOMEN AND PELVIS WITHOUT CONTRAST TECHNIQUE: Multidetector CT imaging of the abdomen and pelvis was performed following the standard protocol without IV contrast. Unenhanced CT was performed per clinician order. Lack of IV contrast limits sensitivity and specificity, especially for evaluation of abdominal/pelvic solid viscera. RADIATION DOSE REDUCTION: This exam was performed according to the departmental dose-optimization program which includes automated exposure control, adjustment of the mA and/or kV according to patient size and/or use of iterative reconstruction technique. COMPARISON:  07/28/2012 FINDINGS: Lower chest: 6 mm left lower lobe nodule image 10/3 previously measuring 5 mm on the 07/28/2012 exam. Bibasilar scarring. No acute airspace disease or effusion. Hepatobiliary: Unremarkable unenhanced appearance of the liver and gallbladder. No biliary duct dilation. Pancreas: Prominent fatty atrophy of the head and body of the pancreas. Otherwise  unremarkable unenhanced appearance. Spleen: Unremarkable unenhanced appearance. Adrenals/Urinary Tract: There are bilateral nonobstructing renal calculi, measuring up to 4 mm on the right and 6 mm on the left. No evidence of obstructive uropathy within either kidney. The adrenals and bladder are unremarkable. Stomach/Bowel: No bowel obstruction or ileus. The appendix, if still present, is not well visualized. No bowel wall thickening or inflammatory change. Moderate hiatal hernia. Vascular/Lymphatic: Aortic atherosclerosis. No enlarged abdominal or pelvic lymph nodes. Reproductive: Atrophic uterus.  No adnexal masses. Other: No free fluid or free intraperitoneal gas. No abdominal wall hernia. Musculoskeletal: Left hip arthroplasty. There are subacute to chronic left lateral eighth through tenth rib fractures, and chronic left posterior eleventh and twelfth rib fractures. Acute angulation at the sacrococcygeal junction and mild sclerosis may reflect acute or subacute fracture. No other acute abnormalities. Reconstructed images demonstrate no additional findings. IMPRESSION: 1. Multiple subacute and chronic left rib fractures as above. 2. Acute to subacute minimally angulated fracture at the sacrococcygeal junction. Near anatomic alignment. 3. Bilateral nonobstructing renal calculi. 4. Indeterminate 6 mm left lower lobe pulmonary nodule. Given the lack of significant interval change since the 2014 exam, this is likely benign. 5.  Aortic Atherosclerosis (ICD10-I70.0). Electronically Signed   By: Randa Ngo M.D.   On: 11/02/2021 16:23    Procedures Procedures    Medications Ordered in ED Medications  acetaminophen (TYLENOL) tablet 650 mg (has no administration in time range)  albuterol (PROVENTIL) (2.5 MG/3ML) 0.083% nebulizer solution 2.5 mg (has no administration in time range)  amLODipine (NORVASC) tablet 5 mg (has no administration in time range)  aspirin EC tablet 81 mg (has no administration in time  range)  benazepril (LOTENSIN) tablet 20 mg (has no administration in time range)  cholecalciferol (VITAMIN D3) 25 MCG (1000 UNIT) tablet 3,000 Units (has no administration in time range)  docusate sodium (COLACE) capsule 200 mg (has no administration in time range)  fentaNYL (DURAGESIC) 50 MCG/HR 1 patch (has no administration in time range)  folic acid (FOLVITE) tablet 1 mg (has no administration in time range)  guaiFENesin (MUCINEX) 12 hr tablet 600 mg (has no administration in time range)  levothyroxine (SYNTHROID) tablet 125 mcg (has no administration in time range)  LORazepam (ATIVAN) tablet 0.5 mg (has no administration in time range)  methotrexate (RHEUMATREX) tablet  15 mg (has no administration in time range)  pantoprazole (PROTONIX) EC tablet 40 mg (has no administration in time range)  oxyCODONE-acetaminophen (PERCOCET) 10-325 MG per tablet 1 tablet (has no administration in time range)  predniSONE (DELTASONE) tablet 5 mg (has no administration in time range)  sertraline (ZOLOFT) tablet 50 mg (has no administration in time range)  cyanocobalamin (VITAMIN B12) tablet 1,000 mcg (has no administration in time range)    ED Course/ Medical Decision Making/ A&P                           Medical Decision Making Amount and/or Complexity of Data Reviewed Radiology: ordered.  Risk OTC drugs. Prescription drug management.   This patient presents to the ED with concern for generalized weakness, back pain. This involves an extensive number of treatment options, and is a complaint that carries with it a high risk of complications and morbidity.  The differential diagnosis includes dehydration versus anemia versus electrolyte derangement versus new traumatic injury to the back versus other  Co-morbidities that complicate the patient evaluation: Patient has a history of chronic pain syndrome, rheumatoid arthritis, on extensive pain regimen, also history of recurring pneumonia, at risk for  recurring falls and pneumonia.  Additional history obtained from patient's daughter at the bedside  External records from outside source obtained and reviewed including hospital discharge summary from June 2023, which the patient was admitted for multifocal pneumonia, sepsis and respiratory failure, treated with antibiotics.  I ordered and personally interpreted labs.  The pertinent results include: No significant findings  I ordered imaging studies including xray of the chest, CT of the abdomen pelvis I independently visualized and interpreted imaging which showed fracture of the coccyx, which correlates to patient's point tenderness on exam.  Also incidental pulmonary nodule.  X-ray was notable for no significant findings I agree with the radiologist interpretation  From a neurological perspective, the patient appears to have normal strength and sensation in the lower extremity, and I suspect her mobility is limited to pain.  She is not requiring an emergent orthopedic or neurosurgical consult, but will need to follow-up with a spine clinic or specialist, which can be done through a rehab facility.  She can be weightbearing as tolerated.  I do think she is a significant fall risk, with her age, dementia, history of frequent falls, rheumatoid arthritis, chronic pain syndrome. She will require PT evaluation and likely rehab placement.  PT eval has been ordered, as well as social work assisting with SNF placement.  Patient is chronic pain medications are ordered, for verification of PDMP.  10 mg oxycodone.  Fentanyl patch to be changed every 3 days.  Ativan 0.5 mg twice daily.  The patient was maintained on a cardiac monitor.  I personally viewed and interpreted the cardiac monitored which showed an underlying rhythm of: regular HR  I have reviewed the patients home medicines and have made adjustments as needed  Test Considered: Lower suspicion for stroke or acute PE.   After the interventions  noted above, I reevaluated the patient and found that they have: stayed the same   Dispostion:  Patient is pending social work consultation for placement in rehab facility.         Final Clinical Impression(s) / ED Diagnoses Final diagnoses:  Closed fracture of sacrum and coccyx, initial encounter ([place])  Weakness  Frequent falls    Rx / DC Orders ED Discharge Orders  None         Wyvonnia Dusky, MD 11/02/21 [id number]

## 2021-11-03 ENCOUNTER — Encounter (HOSPITAL_COMMUNITY): Payer: Self-pay | Admitting: Emergency Medicine

## 2021-11-03 ENCOUNTER — Other Ambulatory Visit: Payer: Self-pay

## 2021-11-03 DIAGNOSIS — S322XXA Fracture of coccyx, initial encounter for closed fracture: Secondary | ICD-10-CM | POA: Diagnosis not present

## 2021-11-03 LAB — URINALYSIS, ROUTINE W REFLEX MICROSCOPIC
Bilirubin Urine: NEGATIVE
Glucose, UA: NEGATIVE mg/dL
Hgb urine dipstick: NEGATIVE
Ketones, ur: NEGATIVE mg/dL
Leukocytes,Ua: NEGATIVE
Nitrite: NEGATIVE
Protein, ur: NEGATIVE mg/dL
Specific Gravity, Urine: 1.014 (ref 1.005–1.030)
pH: 6 (ref 5.0–8.0)

## 2021-11-03 NOTE — NC FL2 (Signed)
[place] MEDICAID FL2 LEVEL OF CARE SCREENING TOOL     IDENTIFICATION  Patient Name: Jocelyn Bauer Birthdate: [birth year] Sex: female Admission Date (Current Location): 11/02/2021  [facility] and [place] Number:  Herbalist and Address:  [facility], [place] [address] [place], [place] [address]      Provider Number: [id number]  Attending Physician Name and Address:  Default, Provider, MD  Relative Name and Phone Number:  [name]- Daughter [phone]    Current Level of Care: Other (Comment) (ED boarding) Recommended Level of Care: [place] Prior Approval Number:    Date Approved/Denied:   PASRR Number: [id number]  Discharge Plan: SNF    Current Diagnoses: Patient Active Problem List   Diagnosis Date Noted   Shortness of breath 07/26/2021   Pneumonia due to infectious organism 06/28/2021   Palliative care encounter 06/25/2021   RA (rheumatoid arthritis) ([facility]) 03/17/2021   DNR (do not resuscitate) 03/17/2021   Infected hardware in right leg ([place])    Chronic osteomyelitis of tibia with draining sinus ([place])    Contusion of left elbow 06/14/2020   Closed trimalleolar fracture of right ankle    Chronic, continuous use of opioids 07/28/2019   Right elbow pain 07/14/2015   Presence of right artificial elbow joint 07/13/2015   Hypertension    Hypothyroidism    Hyperlipidemia    GERD (gastroesophageal reflux disease)    Anxiety    Osteoporosis    Chronic neck and back pain    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    Orientation RESPIRATION BLADDER Height & Weight     Self, Situation, Place  O2 (oxygen at 2LPM at home) Continent Weight: 60.8 kg Height:  '5\' 2"'$  (157.5 cm)  BEHAVIORAL SYMPTOMS/MOOD NEUROLOGICAL BOWEL NUTRITION STATUS      Continent Diet (See DC summary)  AMBULATORY STATUS COMMUNICATION OF NEEDS Skin   Extensive Assist Verbally Normal                       Personal Care Assistance Level  of Assistance  Total care       Total Care Assistance: Maximum assistance   Functional Limitations Info             SPECIAL CARE FACTORS FREQUENCY  PT (By licensed PT), OT (By licensed OT)     PT Frequency: 5X a week OT Frequency: 5 X a week            Contractures      Additional Factors Info  Code Status Code Status Info: Full Code             Current Medications (11/03/2021):  This is the current hospital active medication list Current Facility-Administered Medications  Medication Dose Route Frequency Provider Last Rate Last Admin   acetaminophen (TYLENOL) tablet 650 mg  650 mg Oral TID Wyvonnia Dusky, MD   650 mg at 11/03/21 1652   albuterol (PROVENTIL) (2.5 MG/3ML) 0.083% nebulizer solution 2.5 mg  2.5 mg Nebulization Q2H PRN Wyvonnia Dusky, MD       amLODipine (NORVASC) tablet 5 mg  5 mg Oral Daily Wyvonnia Dusky, MD   5 mg at 11/03/21 1829   aspirin EC tablet 81 mg  81 mg Oral Daily Wyvonnia Dusky, MD   81 mg at 11/03/21 9371   benazepril (LOTENSIN) tablet 20 mg  20 mg Oral q morning Trifan, Carola Rhine, MD   20  mg at 11/03/21 1020   cholecalciferol (VITAMIN D3) 25 MCG (1000 UNIT) tablet 3,000 Units  3,000 Units Oral Daily Wyvonnia Dusky, MD   3,000 Units at 11/03/21 5038   cyanocobalamin (VITAMIN B12) tablet 1,000 mcg  1,000 mcg Oral Daily Wyvonnia Dusky, MD   1,000 mcg at 11/03/21 8828   docusate sodium (COLACE) capsule 200 mg  200 mg Oral BID Wyvonnia Dusky, MD   200 mg at 11/03/21 0034   fentaNYL (DURAGESIC) 50 MCG/HR 1 patch  1 patch Transdermal Q72H Wyvonnia Dusky, MD   1 patch at 91/79/15 0569   folic acid (FOLVITE) tablet 1 mg  1 mg Oral Daily Wyvonnia Dusky, MD   1 mg at 11/03/21 7948   guaiFENesin (MUCINEX) 12 hr tablet 600 mg  600 mg Oral BID PRN Wyvonnia Dusky, MD       levothyroxine (SYNTHROID) tablet 125 mcg  125 mcg Oral Q0600 Wyvonnia Dusky, MD   125 mcg at 11/03/21 0165   LORazepam (ATIVAN) tablet 0.5 mg  0.5 mg  Oral BID Wyvonnia Dusky, MD   0.5 mg at 11/03/21 0952   [START ON 11/04/2021] methotrexate (RHEUMATREX) tablet 15 mg  15 mg Oral Q Sun Trifan, Matthew J, MD       oxyCODONE-acetaminophen (PERCOCET/ROXICET) 5-325 MG per tablet 1 tablet  1 tablet Oral Q6H PRN Wyvonnia Dusky, MD   1 tablet at 11/03/21 5374   And   oxyCODONE (Oxy IR/ROXICODONE) immediate release tablet 5 mg  5 mg Oral Q6H PRN Wyvonnia Dusky, MD   5 mg at 11/03/21 8270   pantoprazole (PROTONIX) EC tablet 40 mg  40 mg Oral Daily Wyvonnia Dusky, MD   40 mg at 11/03/21 7867   predniSONE (DELTASONE) tablet 5 mg  5 mg Oral Daily Wyvonnia Dusky, MD   5 mg at 11/03/21 5449   sertraline (ZOLOFT) tablet 50 mg  50 mg Oral Daily Wyvonnia Dusky, MD   50 mg at 11/03/21 2010   Current Outpatient Medications  Medication Sig Dispense Refill   acetaminophen (TYLENOL) 325 MG tablet Take 2 tablets (650 mg total) by mouth 3 (three) times daily. 10 tablet 0   albuterol (PROVENTIL) (2.5 MG/3ML) 0.083% nebulizer solution Take 3 mLs (2.5 mg total) by nebulization every 2 (two) hours as needed for wheezing. 75 mL 12   amLODipine (NORVASC) 5 MG tablet Take 5 mg by mouth daily.     aspirin EC 81 MG tablet Take 1 tablet (81 mg total) by mouth daily. Swallow whole. 30 tablet    benazepril (LOTENSIN) 20 MG tablet Take 20 mg by mouth every morning.     bisacodyl (DULCOLAX) 5 MG EC tablet Take 1 tablet (5 mg total) by mouth daily as needed for moderate constipation. 30 tablet 0   cholecalciferol (VITAMIN D3) 25 MCG (1000 UNIT) tablet Take 2,000 Units by mouth daily.     denosumab (PROLIA) 60 MG/ML SOSY injection Inject 60 mg into the skin every 6 (six) months.     fentaNYL (DURAGESIC) 50 MCG/HR Place onto the skin every 3 (three) days.     guaiFENesin (MUCINEX) 600 MG 12 hr tablet Take 1 tablet (600 mg total) by mouth 2 (two) times daily as needed for cough.     levothyroxine (SYNTHROID) 125 MCG tablet Take 125 mcg by mouth daily before breakfast.      LORazepam (ATIVAN) 0.5 MG tablet Take 0.5 mg by mouth 2 (two) times  daily.     methocarbamol (ROBAXIN) 500 MG tablet Take 500 mg by mouth 2 (two) times daily as needed.     methotrexate (RHEUMATREX) 2.5 MG tablet Take 15 mg by mouth every Sunday.     omeprazole (PRILOSEC) 20 MG capsule Take 20 mg by mouth daily as needed (heartburn).     oxyCODONE-acetaminophen (PERCOCET) 10-325 MG tablet Take 1 tablet by mouth every 6 (six) hours as needed for pain. 20 tablet 0   polyethylene glycol (MIRALAX / GLYCOLAX) 17 g packet Take 17 g by mouth daily as needed for mild constipation. 14 each 0   potassium chloride (KLOR-CON) 10 MEQ tablet Take 20 mEq by mouth daily.     predniSONE (DELTASONE) 5 MG tablet Take 1 tablet (5 mg total) by mouth daily.     sertraline (ZOLOFT) 50 MG tablet Take 50 mg by mouth daily.     vitamin B-12 (CYANOCOBALAMIN) 1000 MCG tablet Take 1,000 mcg by mouth daily.     acyclovir ointment (ZOVIRAX) 5 % Apply topically 5 (five) times daily. (Patient not taking: Reported on 11/02/2021) 15 g 0     Discharge Medications: Please see discharge summary for a list of discharge medications.  Relevant Imaging Results:  Relevant Lab Results:   Additional Information [phone]  Verdell Carmine, RN

## 2021-11-03 NOTE — ED Notes (Signed)
Pt transferred to hospital bed. Eating breakfast at this time

## 2021-11-03 NOTE — ED Notes (Signed)
Family assisting pt with lunch tray

## 2021-11-03 NOTE — TOC Initial Note (Signed)
Transition of Care [facility]) - Initial/Assessment Note    Patient Details  Name: Jocelyn Bauer MRN: [id number] Date of Birth: [birth year]  Transition of Care [facility]) CM/SW Contact:    Verdell Carmine, RN Phone Number: 11/03/2021, 5:07 PM  Clinical Narrative:                 Spoke to patient at bedside about PT assessment to SNF.  She agrees, sometimes disoriented with dementia, but answering approprietly to questions. Called daughter whom she lives with Ms Lovette Cliche. She prefers [facility] is [facility] [employer] The patient has been there several times before. FL2 done and sent , [facility] not in [place]. Messaged CSW for assistance.   Expected Discharge Plan: Skilled Nursing Facility Barriers to Discharge: Ship broker, ED SNF auth   Patient Goals and CMS Choice        Expected Discharge Plan and Services Expected Discharge Plan: [place] In-house Referral: Clinical Social Work Discharge Planning Services: CM Consult   Living arrangements for the past 2 months: [place]                                      Prior Living Arrangements/Services Living arrangements for the past 2 months: Single Family Home Lives with:: Adult Children Patient language and need for interpreter reviewed:: Yes        Need for Family Participation in Patient Care: Yes (Comment) Care giver support system in place?: Yes (comment)   Criminal Activity/Legal Involvement Pertinent to Current Situation/Hospitalization: No - Comment as needed  Activities of Daily Living      Permission Sought/Granted       For SNF placement           Emotional Assessment Appearance:: Appears younger than stated age   Affect (typically observed): Calm Orientation: : Oriented to Self, Oriented to Place, Fluctuating Orientation (Suspected and/or reported Sundowners) Alcohol / Substance Use: Not Applicable Psych Involvement: No (comment)  Admission diagnosis:  back  pain Patient Active Problem List   Diagnosis Date Noted   Shortness of breath 07/26/2021   Pneumonia due to infectious organism 06/28/2021   Palliative care encounter 06/25/2021   RA (rheumatoid arthritis) ([place]) 03/17/2021   DNR (do not resuscitate) 03/17/2021   Infected hardware in right leg ([place])    Chronic osteomyelitis of tibia with draining sinus ([place])    Contusion of left elbow 06/14/2020   Closed trimalleolar fracture of right ankle    Chronic, continuous use of opioids 07/28/2019   Right elbow pain 07/14/2015   Presence of right artificial elbow joint 07/13/2015   Hypertension    Hypothyroidism    Hyperlipidemia    GERD (gastroesophageal reflux disease)    Anxiety    Osteoporosis    Chronic neck and back pain    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 [place] [address] Phone: [phone] Fax: [facility] [place] - [address] [place] [address] [place] [address] [place] [address] Phone: [phone] Fax: [phone]  [id number] [place] - [address] [place] [address]Phone: [phone]: [phone]    Social  Determinants of Health (SDOH) Interventions    Readmission Risk Interventions     No data to display

## 2021-11-03 NOTE — ED Notes (Signed)
PT at the bedside.

## 2021-11-03 NOTE — Evaluation (Signed)
Physical Therapy Evaluation Patient Details Name: Jocelyn Bauer MRN: [id number] DOB: 07-01-1928 Today's Date: 11/03/2021  History of Present Illness  Pt is a 88 y.o. female who presented 11/02/21 with low back pain and difficulty ambulating. Pt found to have multiple subacute and chronic left rib fxs and acute to subacute coccyx fx. PMH: dementia, anxiety, RA, chronic pain, HLD, HTN, UTI, frequent falls, osteoporosis   Clinical Impression  Pt presents with condition above and deficits mentioned below, see PT Problem List. PTA, she was mod I using a RW or SPC with mobility and living with her daughter in a 1-level house with a ramp entrance. Her daughter works, and thus pt is home alone while the daughter works. Currently, pt is requiring min-modA for bed mobility, modA for transfers, and min-modA to just take a few steps along EOB with a RW for support today. She is demonstrating a tendency to lean posteriorly, impacting her balance. She also displays deficits in strength, cognition, and activity tolerance. She is at high risk for falls and would be unsafe to be home alone at this time. Recommending short-term rehab at a SNF to maximize her return to baseline to allow her to return home safely. Will continue to follow acutely.     Recommendations for follow up therapy are one component of a multi-disciplinary discharge planning process, led by the attending physician.  Recommendations may be updated based on patient status, additional functional criteria and insurance authorization.  Follow Up Recommendations Skilled nursing-short term rehab (<3 hours/day) (perfers [facility]) Can patient physically be transported by private vehicle: Yes    Assistance Recommended at Discharge Frequent or constant Supervision/Assistance  Patient can return home with the following  A lot of help with walking and/or transfers;A lot of help with bathing/dressing/bathroom;Assistance with  cooking/housework;Direct supervision/assist for medications management;Direct supervision/assist for financial management;Assist for transportation;Help with stairs or ramp for entrance    Equipment Recommendations None recommended by PT  Recommendations for Other Services  OT consult    Functional Status Assessment Patient has had a recent decline in their functional status and demonstrates the ability to make significant improvements in function in a reasonable and predictable amount of time.     Precautions / Restrictions Precautions Precautions: Fall;Other (comment) Precaution Comments: watch SpO2 (on 2L) Restrictions Weight Bearing Restrictions: No Other Position/Activity Restrictions: WBAT      Mobility  Bed Mobility Overal bed mobility: Needs Assistance Bed Mobility: Supine to Sit, Rolling, Sit to Supine Rolling: Min assist   Supine to sit: HOB elevated, Mod assist Sit to supine: Min assist, HOB elevated   General bed mobility comments: MinA to complete rotation of trunk to roll. ModA to bring legs off EOB, ascend trunk, and scoot hips to EOB with pt repeatedly trying to sit up in long-sitting despite cues to navigate legs to edge and bring shoulders to contralateral side to pivot on buttocks before sitting up. MinA to bring legs onto bed and direct trunk to supine    Transfers Overall transfer level: Needs assistance Equipment used: Rolling walker (2 wheels) Transfers: Sit to/from Stand Sit to Stand: Mod assist           General transfer comment: Pt with posterior bias, needing repeated cues to place toes on ground and lean forward into forefeet and RW, modA to prevent posterior LOB coming to stand from EOB 2x    Ambulation/Gait Ambulation/Gait assistance: Min assist, Mod assist Gait Distance (Feet): 3 Feet (x2 bouts of ~3 ft each bout)  Assistive device: Rolling walker (2 wheels) Gait Pattern/deviations: Step-to pattern, Decreased step length - right, Decreased  step length - left, Decreased stride length, Trunk flexed, Leaning posteriorly, Shuffle Gait velocity: reduced Gait velocity interpretation: <1.31 ft/sec, indicative of household ambulator   General Gait Details: Pt with small, slow, shuffling steps anterior, posterior, and lateral along EOB, displaying a posterior bias, constantly needing at least minA for stability. Pt with intermittent LOB, needing modA to recover  Stairs            Wheelchair Mobility    Modified Rankin (Stroke Patients Only)       Balance Overall balance assessment: Needs assistance Sitting-balance support: No upper extremity supported, Feet supported Sitting balance-Leahy Scale: Fair Sitting balance - Comments: Static sitting EOB with min guard for safety, mild posterior bias noted Postural control: Posterior lean Standing balance support: Bilateral upper extremity supported, During functional activity, Reliant on assistive device for balance Standing balance-Leahy Scale: Poor Standing balance comment: Reliant on RW and external physical assistance, posterior bias stronger in standing                             Pertinent Vitals/Pain Pain Assessment Pain Assessment: Faces Faces Pain Scale: Hurts little more Pain Location: back Pain Descriptors / Indicators: Discomfort, Grimacing, Guarding Pain Intervention(s): Limited activity within patient's tolerance, Monitored during session, Repositioned    Home Living Family/patient expects to be discharged to:: Private residence Living Arrangements: Children (daughter who works) Available Help at Discharge: Family;Available PRN/intermittently (daughter works, DIL can check on her intermittently) Type of Home: House Home Access: [place]: One level Home Equipment: Conservation officer, nature (2 wheels);Cane - single point;Wheelchair - manual;BSC/3in1;Shower seat      Prior Function Prior Level of Function : Needs assist              Mobility Comments: Mod I for mobility using SPC or RW. ADLs Comments: Pt able to dress and bathe self, but daughter manages her meds and finances and does the household chores and cooking and provides transortation     Hand Dominance   Dominant Hand: Right    Extremity/Trunk Assessment   Upper Extremity Assessment Upper Extremity Assessment: Defer to OT evaluation    Lower Extremity Assessment Lower Extremity Assessment: Generalized weakness    Cervical / Trunk Assessment Cervical / Trunk Assessment: Kyphotic  Communication   Communication: No difficulties  Cognition Arousal/Alertness: Awake/alert Behavior During Therapy: WFL for tasks assessed/performed Overall Cognitive Status: Impaired/Different from baseline Area of Impairment: Memory, Attention, Following commands, Problem solving                   Current Attention Level: Selective Memory: Decreased short-term memory Following Commands: Follows one step commands consistently, Follows one step commands with increased time, Follows multi-step commands inconsistently     Problem Solving: Requires verbal cues, Requires tactile cues, Difficulty sequencing, Slow processing General Comments: Per daughter, pt has intermittent memory deficits at baseline but normally more when she gets a UTI. Currently, pt is requiring repeated, simple, multi-modal cues to sequence tasks and reminders to remain on task or perform a task a certain way.        General Comments General comments (skin integrity, edema, etc.): SPO2 >/= 87% on 2L    Exercises     Assessment/Plan    PT Assessment Patient needs continued PT services  PT Problem List Decreased strength;Decreased activity  tolerance;Decreased balance;Decreased mobility;Decreased cognition       PT Treatment Interventions DME instruction;Gait training;Functional mobility training;Therapeutic activities;Balance training;Therapeutic exercise;Neuromuscular  re-education;Cognitive remediation;Patient/family education    PT Goals (Current goals can be found in the Care Plan section)  Acute Rehab PT Goals Patient Stated Goal: to not hurt PT Goal Formulation: With patient/family Time For Goal Achievement: 11/17/21 Potential to Achieve Goals: Fair    Frequency Min 2X/week     Co-evaluation               AM-PAC PT "6 Clicks" Mobility  Outcome Measure Help needed turning from your back to your side while in a flat bed without using bedrails?: A Little Help needed moving from lying on your back to sitting on the side of a flat bed without using bedrails?: A Lot Help needed moving to and from a bed to a chair (including a wheelchair)?: A Lot Help needed standing up from a chair using your arms (e.g., wheelchair or bedside chair)?: A Lot Help needed to walk in hospital room?: Total Help needed climbing 3-5 steps with a railing? : Total 6 Click Score: 11    End of Session Equipment Utilized During Treatment: Gait belt;Oxygen Activity Tolerance: Patient tolerated treatment well Patient left: in bed;with call bell/phone within reach;with bed alarm set;with family/visitor present   PT Visit Diagnosis: Unsteadiness on feet (R26.81);Other abnormalities of gait and mobility (R26.89);Muscle weakness (generalized) (M62.81);History of falling (Z91.81);Difficulty in walking, not elsewhere classified (R26.2)    Time: 1610-9604 PT Time Calculation (min) (ACUTE ONLY): 37 min   Charges:   PT Evaluation $PT Eval Moderate Complexity: 1 Mod PT Treatments $Therapeutic Activity: 8-22 mins        Moishe Spice, PT, DPT Acute Rehabilitation Services  Office: [phone]   Orvan Falconer 11/03/2021, 2:07 PM

## 2021-11-04 DIAGNOSIS — S322XXA Fracture of coccyx, initial encounter for closed fracture: Secondary | ICD-10-CM | POA: Diagnosis not present

## 2021-11-04 NOTE — Progress Notes (Addendum)
10:35am: Patient is eligible for [facility] ACO REACH waiver.  8:40am: CSW sent patient's clinicals to [facility] for review.  CSW sent e-mail to determine if patient is [place] waiver eligible.  Madilyn Fireman, MSW, LCSW Transitions of Care  Clinical Social Worker II [phone]

## 2021-11-04 NOTE — ED Provider Notes (Signed)
Emergency Medicine Observation Re-evaluation Note  ORIYA KETTERING is a 88 y.o. female, seen on rounds today.  Pt initially presented to the ED for complaints of Back Pain Currently, the patient is sitting in bed visiting with family.  Physical Exam  BP (!) 107/42 (BP Location: Right Arm)   Pulse 89   Temp 98.7 F (37.1 C) (Oral)   Resp 18   Ht '5\' 2"'$  (1.575 m)   Wt 60.8 kg   SpO2 98%   BMI 24.51 kg/m  Physical Exam General: Awake and alert   ED Course / MDM  EKG:   I have reviewed the labs performed to date as well as medications administered while in observation.  Recent changes in the last 24 hours include none.  Plan  Current plan is for SNF placement.    Truddie Hidden, MD 11/04/21 1218

## 2021-11-04 NOTE — ED Notes (Signed)
Clean brief placed on patient.

## 2021-11-05 DIAGNOSIS — S322XXA Fracture of coccyx, initial encounter for closed fracture: Secondary | ICD-10-CM | POA: Diagnosis not present

## 2021-11-05 NOTE — Progress Notes (Addendum)
12:15pm: CSW spoke with patient's daughter Marcie Bal at bedside to have discussion regarding discharge planning. Marcie Bal stated she does not want her mother anywhere else other than [facility] due to close proximity to family and patient's previous stay there. All questions answered, CSW encouraged Marcie Bal to reach out if additional questions or needs arise.  9:45am: CSW attempted to reach admissions staff at [facility] but was informed she is out of the office today - a voicemail was left requesting a return call.  8:35am: CSW reached out to [facility] to request she review patient.  Madilyn Fireman, MSW, LCSW Transitions of Care  Clinical Social Worker II [phone]

## 2021-11-05 NOTE — Evaluation (Signed)
Occupational Therapy Evaluation Patient Details Name: Jocelyn Bauer MRN: [id number] DOB: [birth year] Today's Date: 11/05/2021   History of Present Illness Pt is a 88 y.o. female who presented 11/02/21 with low back pain and difficulty ambulating. Pt found to have multiple subacute and chronic left rib fxs and acute to subacute coccyx fx. PMH: dementia, anxiety, RA, chronic pain, HLD, HTN, UTI, frequent falls, osteoporosis   Clinical Impression   Tink was evaluated s/p the above admission list, no family present at the time of eval. Pt reports she is mod I at baseline and her daughter lives with her. Upon evaluation pt had functional limitations due to generalized weakness, impaired confusion, poor balance and decreased activity tolerance. Overall she required min A for bed mobility, mod A to stand at EOB and max A to take pivotal steps to/from the [facility]. She required max A for LB clothing and hygiene in standing, and min A for all UB ADLs. OT to continued to follow acutely. Recommend d/c to SNF for continued therapy.      Recommendations for follow up therapy are one component of a multi-disciplinary discharge planning process, led by the attending physician.  Recommendations may be updated based on patient status, additional functional criteria and insurance authorization.   Follow Up Recommendations  Skilled nursing-short term rehab (<3 hours/day)    Assistance Recommended at Discharge Frequent or constant Supervision/Assistance  Patient can return home with the following A lot of help with walking and/or transfers;A lot of help with bathing/dressing/bathroom;Two people to help with bathing/dressing/bathroom;Assistance with cooking/housework;Direct supervision/assist for medications management;Direct supervision/assist for financial management;Assist for transportation;Help with stairs or ramp for entrance    Functional Status Assessment  Patient has had a recent decline in their functional  status and demonstrates the ability to make significant improvements in function in a reasonable and predictable amount of time.  Equipment Recommendations       Recommendations for Other Services       Precautions / Restrictions Precautions Precautions: Fall;Other (comment) Precaution Comments: watch SpO2 (on 2L) Restrictions Weight Bearing Restrictions: No Other Position/Activity Restrictions: WBAT      Mobility Bed Mobility Overal bed mobility: Needs Assistance Bed Mobility: Supine to Sit, Sit to Supine Rolling: Min assist     Sit to supine: Min assist   General bed mobility comments: cues provided for sequencing    Transfers Overall transfer level: Needs assistance Equipment used: 1 person hand held assist Transfers: Sit to/from Stand, Bed to chair/wheelchair/BSC Sit to Stand: Mod assist     Step pivot transfers: Max assist            Balance Overall balance assessment: Needs assistance Sitting-balance support: No upper extremity supported, Feet supported Sitting balance-Leahy Scale: Fair     Standing balance support: Bilateral upper extremity supported, During functional activity, Reliant on assistive device for balance Standing balance-Leahy Scale: Poor                             ADL either performed or assessed with clinical judgement   ADL Overall ADL's : Needs assistance/impaired Eating/Feeding: Set up;Sitting   Grooming: Set up;Sitting   Upper Body Bathing: Minimal assistance;Sitting   Lower Body Bathing: Maximal assistance;Sit to/from stand   Upper Body Dressing : Minimal assistance;Sitting   Lower Body Dressing: Maximal assistance;Sit to/from stand   Toilet Transfer: Maximal assistance;Stand-pivot Toilet Transfer Details (indicate cue type and reason): to [place] Manipulation and Hygiene: Maximal assistance;Sit  to/from stand       Functional mobility during ADLs: Maximal assistance General ADL Comments:  limited by weakness, balance and confusion     Vision Baseline Vision/History: 0 No visual deficits Vision Assessment?: No apparent visual deficits     Perception     Praxis      Pertinent Vitals/Pain Pain Assessment Pain Assessment: No/denies pain Pain Intervention(s): Limited activity within patient's tolerance, Monitored during session     Hand Dominance Right   Extremity/Trunk Assessment Upper Extremity Assessment Upper Extremity Assessment: Generalized weakness   Lower Extremity Assessment Lower Extremity Assessment: Generalized weakness   Cervical / Trunk Assessment Cervical / Trunk Assessment: Kyphotic   Communication Communication Communication: No difficulties   Cognition Arousal/Alertness: Awake/alert Behavior During Therapy: WFL for tasks assessed/performed Overall Cognitive Status: Impaired/Different from baseline Area of Impairment: Memory, Attention, Following commands, Problem solving                   Current Attention Level: Selective Memory: Decreased short-term memory Following Commands: Follows one step commands consistently, Follows one step commands with increased time, Follows multi-step commands inconsistently     Problem Solving: Requires verbal cues, Requires tactile cues, Difficulty sequencing, Slow processing General Comments: seemingly confused to location and situation. follows simple multimodal cues. verbose     General Comments  VSS on 2L    Exercises     Shoulder Instructions      Home Living Family/patient expects to be discharged to:: Private residence Living Arrangements: Children Available Help at Discharge: Family;Available PRN/intermittently Type of Home: House Home Access: Ramped entrance     Home Layout: One level     Bathroom Shower/Tub: Tub/shower unit         Home Equipment: Conservation officer, nature (2 wheels);Cane - single point;Wheelchair - manual;BSC/3in1;Shower seat          Prior  Functioning/Environment Prior Level of Function : Needs assist             Mobility Comments: Mod I for mobility using SPC or RW. ADLs Comments: Pt able to dress and bathe self, but daughter manages her meds and finances and does the household chores and cooking and provides transortation        OT Problem List: Decreased strength;Decreased range of motion;Decreased activity tolerance;Impaired balance (sitting and/or standing);Decreased safety awareness;Decreased knowledge of use of DME or AE;Decreased knowledge of precautions;Pain      OT Treatment/Interventions: Self-care/ADL training;Therapeutic exercise;DME and/or AE instruction;Therapeutic activities;Patient/family education;Balance training    OT Goals(Current goals can be found in the care plan section) Acute Rehab OT Goals Patient Stated Goal: to call her daughter OT Goal Formulation: With patient Time For Goal Achievement: 11/19/21 Potential to Achieve Goals: Good ADL Goals Pt Will Perform Grooming: with min guard assist;standing Pt Will Perform Upper Body Dressing: with modified independence;sitting Pt Will Perform Lower Body Dressing: with min assist;sit to/from stand Pt Will Transfer to Toilet: with min assist;ambulating Pt/caregiver will Perform Home Exercise Program: Increased strength;Both right and left upper extremity;With written HEP provided  OT Frequency: Min 2X/week    Co-evaluation              AM-PAC OT "6 Clicks" Daily Activity     Outcome Measure Help from another person eating meals?: A Little Help from another person taking care of personal grooming?: A Little Help from another person toileting, which includes using toliet, bedpan, or urinal?: A Lot Help from another person bathing (including washing, rinsing, drying)?: A Lot Help from another  person to put on and taking off regular upper body clothing?: A Little Help from another person to put on and taking off regular lower body clothing?: A  Lot 6 Click Score: 15   End of Session Equipment Utilized During Treatment: Gait belt;Rolling walker (2 wheels) (BSC) Nurse Communication: Mobility status (urinated on BSC)  Activity Tolerance: Patient tolerated treatment well Patient left: in bed;with call bell/phone within reach  OT Visit Diagnosis: Unsteadiness on feet (R26.81);Other abnormalities of gait and mobility (R26.89);Muscle weakness (generalized) (M62.81);Pain                Time: 6047-9987 OT Time Calculation (min): 27 min Charges:  OT General Charges $OT Visit: 1 Visit OT Evaluation $OT Eval Moderate Complexity: 1 Mod OT Treatments $Self Care/Home Management : 8-22 mins   Elliot Cousin 11/05/2021, 11:50 AM

## 2021-11-06 DIAGNOSIS — S322XXA Fracture of coccyx, initial encounter for closed fracture: Secondary | ICD-10-CM | POA: Diagnosis not present

## 2021-11-06 NOTE — ED Notes (Signed)
Pt soiled herself in the bed. Pt cleaned. Full linen change. Purewick replaced. Pt resting comfortably.

## 2021-11-06 NOTE — Progress Notes (Signed)
12pm: CSW spoke with [facility] supervisor who advised CSW to e-mail facility social worker.  CSW sent secure e-mail to social worker at the facility requesting patient be reviewed for a possible bed offer.  8:30am: CSW attempted to reach admissions at [facility] without success - a voicemail was left requesting a return call.  Madilyn Fireman, MSW, LCSW Transitions of Care  Clinical Social Worker II [phone]

## 2021-11-06 NOTE — Progress Notes (Signed)
Physical Therapy Treatment Patient Details Name: Jocelyn Bauer MRN: [id number] DOB: 1928-12-04 Today's Date: 11/06/2021   History of Present Illness Pt is a 88 y.o. female who presented 11/02/21 with low back pain and difficulty ambulating. Pt found to have multiple subacute and chronic left rib fxs and acute to subacute coccyx fx. PMH: dementia, anxiety, RA, chronic pain, HLD, HTN, UTI, frequent falls, osteoporosis    PT Comments    Pt progressing with mobility, today's session limited due to pt insistent on getting assistance with breakfast. Today's session focused on bed mobility and dynamic sitting balance, included general fine motor assessment with breakfast condiments. Instructed pt on log rolling to reduce back pain, requiring mod-maxA for maintaining upright positioning sitting EOB until pt appropriately repositioned. Pt remains limited by generalized weakness, decreased activity tolerance, and impaired balance strategies/postural reactions. Continue to recommend acute PT services to maximize functional mobility and independence prior to d/c to SNF level therapies.    Recommendations for follow up therapy are one component of a multi-disciplinary discharge planning process, led by the attending physician.  Recommendations may be updated based on patient status, additional functional criteria and insurance authorization.  Follow Up Recommendations  Skilled nursing-short term rehab (<3 hours/day) Can patient physically be transported by private vehicle: Yes   Assistance Recommended at Discharge Frequent or constant Supervision/Assistance  Patient can return home with the following A lot of help with walking and/or transfers;A lot of help with bathing/dressing/bathroom;Assistance with cooking/housework;Direct supervision/assist for medications management;Direct supervision/assist for financial management;Assist for transportation;Help with stairs or ramp for entrance   Equipment  Recommendations  None recommended by PT    Recommendations for Other Services       Precautions / Restrictions Precautions Precautions: Fall;Other (comment) Precaution Comments: watch SpO2 (on 2L) Restrictions Weight Bearing Restrictions: No     Mobility  Bed Mobility Overal bed mobility: Needs Assistance Bed Mobility: Supine to Sit, Sidelying to Sit Rolling: Min guard Sidelying to sit: Mod assist, Max assist, HOB elevated       General bed mobility comments: cues for log roll technique to reduce low back pain. pt mod-maxA to get sitting EOB due to heavy R lean. pt able to reposition hips sitting EOB minA with use of 1 handrail and pulling fwd    Transfers                        Ambulation/Gait                   Stairs             Wheelchair Mobility    Modified Rankin (Stroke Patients Only)       Balance Overall balance assessment: Needs assistance Sitting-balance support: Single extremity supported, Feet unsupported Sitting balance-Leahy Scale: Fair Sitting balance - Comments: pt able to sit with R UE support only, progressing to no UE support once aligned appropriately at EOB. dynamic weight shifting with pt unable to reach outside BOS when trialing reaching R+L both overhead and below waist level Postural control: Right lateral lean                                  Cognition Arousal/Alertness: Awake/alert Behavior During Therapy: WFL for tasks assessed/performed Overall Cognitive Status: No family/caregiver present to determine baseline cognitive functioning Area of Impairment: Memory, Attention, Following commands, Problem solving  Current Attention Level: Selective Memory: Decreased short-term memory Following Commands: Follows one step commands consistently, Follows one step commands with increased time     Problem Solving: Requires verbal cues, Requires tactile cues, Difficulty  sequencing, Decreased initiation General Comments: pt had one bout of apparent confusion during session, requiring PT to reorient her to the task at hand. pt able to demonstrate fine motor control with opening items for breakfast and preparing them appropriately (ex. cream in coffee, butter in grits)        Exercises      General Comments General comments (skin integrity, edema, etc.): VSS on 2L [place]      Pertinent Vitals/Pain Pain Assessment Faces Pain Scale: Hurts even more Pain Location: back Pain Descriptors / Indicators: Moaning, Grimacing Pain Intervention(s): Monitored during session, Patient requesting pain meds-RN notified    Home Living                          Prior Function            PT Goals (current goals can now be found in the care plan section) Acute Rehab PT Goals Patient Stated Goal: to not hurt PT Goal Formulation: With patient/family Time For Goal Achievement: 11/17/21 Potential to Achieve Goals: Fair Progress towards PT goals: Progressing toward goals    Frequency    Min 2X/week      PT Plan Current plan remains appropriate    Co-evaluation              AM-PAC PT "6 Clicks" Mobility   Outcome Measure  Help needed turning from your back to your side while in a flat bed without using bedrails?: A Little Help needed moving from lying on your back to sitting on the side of a flat bed without using bedrails?: A Lot Help needed moving to and from a bed to a chair (including a wheelchair)?: A Lot Help needed standing up from a chair using your arms (e.g., wheelchair or bedside chair)?: A Lot Help needed to walk in hospital room?: Total Help needed climbing 3-5 steps with a railing? : Total 6 Click Score: 11    End of Session Equipment Utilized During Treatment: Oxygen Activity Tolerance: Patient tolerated treatment well Patient left: in bed;with call bell/phone within reach Nurse Communication: Mobility status;Other  (comment);Patient requests pain meds (notified RN pt sitting EOB eating breakfast) PT Visit Diagnosis: Unsteadiness on feet (R26.81);Other abnormalities of gait and mobility (R26.89);Muscle weakness (generalized) (M62.81);History of falling (Z91.81);Difficulty in walking, not elsewhere classified (R26.2)     Time: 8546-2703 PT Time Calculation (min) (ACUTE ONLY): 18 min  Charges:  $Therapeutic Activity: 8-22 mins           Chipper Oman, SPT    [place] Grantley Savage 11/06/2021, 9:57 AM

## 2021-11-06 NOTE — ED Notes (Signed)
PT at bedside.

## 2021-11-07 DIAGNOSIS — M069 Rheumatoid arthritis, unspecified: Secondary | ICD-10-CM | POA: Diagnosis not present

## 2021-11-07 DIAGNOSIS — M6281 Muscle weakness (generalized): Secondary | ICD-10-CM | POA: Diagnosis not present

## 2021-11-07 DIAGNOSIS — J449 Chronic obstructive pulmonary disease, unspecified: Secondary | ICD-10-CM | POA: Diagnosis not present

## 2021-11-07 DIAGNOSIS — E039 Hypothyroidism, unspecified: Secondary | ICD-10-CM | POA: Diagnosis not present

## 2021-11-07 DIAGNOSIS — F32A Depression, unspecified: Secondary | ICD-10-CM | POA: Diagnosis not present

## 2021-11-07 DIAGNOSIS — G894 Chronic pain syndrome: Secondary | ICD-10-CM | POA: Diagnosis not present

## 2021-11-07 DIAGNOSIS — M810 Age-related osteoporosis without current pathological fracture: Secondary | ICD-10-CM | POA: Diagnosis not present

## 2021-11-07 DIAGNOSIS — Z7982 Long term (current) use of aspirin: Secondary | ICD-10-CM | POA: Diagnosis not present

## 2021-11-07 DIAGNOSIS — Z4789 Encounter for other orthopedic aftercare: Secondary | ICD-10-CM | POA: Diagnosis not present

## 2021-11-07 DIAGNOSIS — R5381 Other malaise: Secondary | ICD-10-CM | POA: Diagnosis not present

## 2021-11-07 DIAGNOSIS — Z9181 History of falling: Secondary | ICD-10-CM | POA: Diagnosis not present

## 2021-11-07 DIAGNOSIS — R262 Difficulty in walking, not elsewhere classified: Secondary | ICD-10-CM | POA: Diagnosis not present

## 2021-11-07 DIAGNOSIS — Z20822 Contact with and (suspected) exposure to covid-19: Secondary | ICD-10-CM | POA: Diagnosis not present

## 2021-11-07 DIAGNOSIS — Z79899 Other long term (current) drug therapy: Secondary | ICD-10-CM | POA: Diagnosis not present

## 2021-11-07 DIAGNOSIS — R0902 Hypoxemia: Secondary | ICD-10-CM | POA: Diagnosis not present

## 2021-11-07 DIAGNOSIS — R531 Weakness: Secondary | ICD-10-CM | POA: Diagnosis not present

## 2021-11-07 DIAGNOSIS — S322XXD Fracture of coccyx, subsequent encounter for fracture with routine healing: Secondary | ICD-10-CM | POA: Diagnosis not present

## 2021-11-07 DIAGNOSIS — T8090XA Unspecified complication following infusion and therapeutic injection, initial encounter: Secondary | ICD-10-CM | POA: Diagnosis not present

## 2021-11-07 DIAGNOSIS — R296 Repeated falls: Secondary | ICD-10-CM | POA: Diagnosis not present

## 2021-11-07 DIAGNOSIS — D649 Anemia, unspecified: Secondary | ICD-10-CM | POA: Diagnosis not present

## 2021-11-07 DIAGNOSIS — S322XXA Fracture of coccyx, initial encounter for closed fracture: Secondary | ICD-10-CM | POA: Diagnosis not present

## 2021-11-07 DIAGNOSIS — S3210XD Unspecified fracture of sacrum, subsequent encounter for fracture with routine healing: Secondary | ICD-10-CM | POA: Diagnosis not present

## 2021-11-07 DIAGNOSIS — I10 Essential (primary) hypertension: Secondary | ICD-10-CM | POA: Diagnosis not present

## 2021-11-07 DIAGNOSIS — E538 Deficiency of other specified B group vitamins: Secondary | ICD-10-CM | POA: Diagnosis not present

## 2021-11-07 DIAGNOSIS — H6123 Impacted cerumen, bilateral: Secondary | ICD-10-CM | POA: Diagnosis not present

## 2021-11-07 DIAGNOSIS — L0390 Cellulitis, unspecified: Secondary | ICD-10-CM | POA: Diagnosis not present

## 2021-11-07 DIAGNOSIS — J9611 Chronic respiratory failure with hypoxia: Secondary | ICD-10-CM | POA: Diagnosis not present

## 2021-11-07 DIAGNOSIS — S3210XA Unspecified fracture of sacrum, initial encounter for closed fracture: Secondary | ICD-10-CM | POA: Diagnosis not present

## 2021-11-07 DIAGNOSIS — E785 Hyperlipidemia, unspecified: Secondary | ICD-10-CM | POA: Diagnosis not present

## 2021-11-07 DIAGNOSIS — R600 Localized edema: Secondary | ICD-10-CM | POA: Diagnosis not present

## 2021-11-07 DIAGNOSIS — R1311 Dysphagia, oral phase: Secondary | ICD-10-CM | POA: Diagnosis not present

## 2021-11-07 DIAGNOSIS — E559 Vitamin D deficiency, unspecified: Secondary | ICD-10-CM | POA: Diagnosis not present

## 2021-11-07 DIAGNOSIS — W19XXXA Unspecified fall, initial encounter: Secondary | ICD-10-CM | POA: Diagnosis not present

## 2021-11-07 DIAGNOSIS — J189 Pneumonia, unspecified organism: Secondary | ICD-10-CM | POA: Diagnosis not present

## 2021-11-07 DIAGNOSIS — Z741 Need for assistance with personal care: Secondary | ICD-10-CM | POA: Diagnosis not present

## 2021-11-07 DIAGNOSIS — R41841 Cognitive communication deficit: Secondary | ICD-10-CM | POA: Diagnosis not present

## 2021-11-07 DIAGNOSIS — K5900 Constipation, unspecified: Secondary | ICD-10-CM | POA: Diagnosis not present

## 2021-11-07 DIAGNOSIS — Z7401 Bed confinement status: Secondary | ICD-10-CM | POA: Diagnosis not present

## 2021-11-07 DIAGNOSIS — F419 Anxiety disorder, unspecified: Secondary | ICD-10-CM | POA: Diagnosis not present

## 2021-11-07 DIAGNOSIS — K219 Gastro-esophageal reflux disease without esophagitis: Secondary | ICD-10-CM | POA: Diagnosis not present

## 2021-11-07 LAB — CULTURE, BLOOD (ROUTINE X 2): Culture: NO GROWTH

## 2021-11-07 NOTE — ED Provider Notes (Signed)
  Physical Exam  BP 126/67   Pulse 77   Temp 97.8 F (36.6 C) (Oral)   Resp 19   Ht 1.575 m ('5\' 2"'$ )   Wt 60.8 kg   SpO2 99%   BMI 24.51 kg/m   Physical Exam  Procedures  Procedures  ED Course / MDM    Medical Decision Making Amount and/or Complexity of Data Reviewed Radiology: ordered.  Risk OTC drugs. Prescription drug management.   88 year old female who presented on October 27 with sacral pain.  A new sacral fracture.  She was unable to get around at home due to pain.  Was evaluated here with labs and imaging.  She had multiple old rib fractures acute and subacute acro coccygeal fracture.  She has been here in the ED pending placement in a facility. She is to be placed today. She appears stable for placement No new complaints at this time       Pattricia Boss, MD 11/07/21 1230

## 2021-11-07 NOTE — ED Notes (Signed)
Oxygen tank and cart were left in room. Unsure if they belonged to pt or hospital.

## 2021-11-07 NOTE — Progress Notes (Signed)
CSW spoke with [place] at [facility] who states the patient can be transferred to the facility.  CSW notified MD and RN of information.  RN will call PTAR once ready.  RN will call report to [phone].  Madilyn Fireman, MSW, LCSW Transitions of Care  Clinical Social Worker II [phone]

## 2021-11-08 DIAGNOSIS — E559 Vitamin D deficiency, unspecified: Secondary | ICD-10-CM | POA: Diagnosis not present

## 2021-11-08 DIAGNOSIS — R600 Localized edema: Secondary | ICD-10-CM | POA: Diagnosis not present

## 2021-11-08 DIAGNOSIS — J189 Pneumonia, unspecified organism: Secondary | ICD-10-CM | POA: Diagnosis not present

## 2021-11-08 DIAGNOSIS — I10 Essential (primary) hypertension: Secondary | ICD-10-CM | POA: Diagnosis not present

## 2021-11-08 DIAGNOSIS — M810 Age-related osteoporosis without current pathological fracture: Secondary | ICD-10-CM | POA: Diagnosis not present

## 2021-11-08 DIAGNOSIS — R5381 Other malaise: Secondary | ICD-10-CM | POA: Diagnosis not present

## 2021-11-08 DIAGNOSIS — K5900 Constipation, unspecified: Secondary | ICD-10-CM | POA: Diagnosis not present

## 2021-11-08 DIAGNOSIS — M069 Rheumatoid arthritis, unspecified: Secondary | ICD-10-CM | POA: Diagnosis not present

## 2021-11-12 DIAGNOSIS — K5900 Constipation, unspecified: Secondary | ICD-10-CM | POA: Diagnosis not present

## 2021-11-12 DIAGNOSIS — G894 Chronic pain syndrome: Secondary | ICD-10-CM | POA: Diagnosis not present

## 2021-11-12 DIAGNOSIS — S322XXD Fracture of coccyx, subsequent encounter for fracture with routine healing: Secondary | ICD-10-CM | POA: Diagnosis not present

## 2021-11-12 DIAGNOSIS — S3210XD Unspecified fracture of sacrum, subsequent encounter for fracture with routine healing: Secondary | ICD-10-CM | POA: Diagnosis not present

## 2021-11-12 DIAGNOSIS — R5381 Other malaise: Secondary | ICD-10-CM | POA: Diagnosis not present

## 2021-11-12 DIAGNOSIS — J9611 Chronic respiratory failure with hypoxia: Secondary | ICD-10-CM | POA: Diagnosis not present

## 2021-11-12 DIAGNOSIS — D649 Anemia, unspecified: Secondary | ICD-10-CM | POA: Diagnosis not present

## 2021-11-12 DIAGNOSIS — I10 Essential (primary) hypertension: Secondary | ICD-10-CM | POA: Diagnosis not present

## 2021-11-12 DIAGNOSIS — M069 Rheumatoid arthritis, unspecified: Secondary | ICD-10-CM | POA: Diagnosis not present

## 2021-11-13 DIAGNOSIS — E039 Hypothyroidism, unspecified: Secondary | ICD-10-CM | POA: Diagnosis not present

## 2021-11-13 DIAGNOSIS — E785 Hyperlipidemia, unspecified: Secondary | ICD-10-CM | POA: Diagnosis not present

## 2021-11-13 DIAGNOSIS — M810 Age-related osteoporosis without current pathological fracture: Secondary | ICD-10-CM | POA: Diagnosis not present

## 2021-11-13 DIAGNOSIS — I10 Essential (primary) hypertension: Secondary | ICD-10-CM | POA: Diagnosis not present

## 2021-11-14 DIAGNOSIS — R5381 Other malaise: Secondary | ICD-10-CM | POA: Diagnosis not present

## 2021-11-14 DIAGNOSIS — E559 Vitamin D deficiency, unspecified: Secondary | ICD-10-CM | POA: Diagnosis not present

## 2021-11-14 DIAGNOSIS — J9611 Chronic respiratory failure with hypoxia: Secondary | ICD-10-CM | POA: Diagnosis not present

## 2021-11-14 DIAGNOSIS — F419 Anxiety disorder, unspecified: Secondary | ICD-10-CM | POA: Diagnosis not present

## 2021-11-14 DIAGNOSIS — E039 Hypothyroidism, unspecified: Secondary | ICD-10-CM | POA: Diagnosis not present

## 2021-11-14 DIAGNOSIS — G894 Chronic pain syndrome: Secondary | ICD-10-CM | POA: Diagnosis not present

## 2021-11-14 DIAGNOSIS — M069 Rheumatoid arthritis, unspecified: Secondary | ICD-10-CM | POA: Diagnosis not present

## 2021-11-14 DIAGNOSIS — K219 Gastro-esophageal reflux disease without esophagitis: Secondary | ICD-10-CM | POA: Diagnosis not present

## 2021-11-14 DIAGNOSIS — D649 Anemia, unspecified: Secondary | ICD-10-CM | POA: Diagnosis not present

## 2021-11-14 DIAGNOSIS — K5900 Constipation, unspecified: Secondary | ICD-10-CM | POA: Diagnosis not present

## 2021-11-15 ENCOUNTER — Other Ambulatory Visit: Payer: Self-pay | Admitting: *Deleted

## 2021-11-15 NOTE — Patient Outreach (Signed)
Mrs. Trabucco recently admitted to [facility] under [place] 3-day SNF waiver. Screening for potential [facility] care coordination services as benefit of insurance plan and PCP.   Secure communication sent to SNF social worker to inquire about transition plans and barriers.   Will continue to follow.   Marthenia Rolling, MSN, RN,BSN [place] Acute Care Coordinator [phone] (Direct dial)

## 2021-11-16 ENCOUNTER — Other Ambulatory Visit: Payer: Self-pay | Admitting: *Deleted

## 2021-11-16 NOTE — Patient Outreach (Signed)
[facility] Post- Acute Care Coordinator follow up. [name] resides in [facility].   Update received from SNF SW Director. [name] is from home with daughter who works during the day. Discharge date not set at this time.   Will continue to follow for transition plans and for potential [facility] care coordination needs as benefit of insurance plan and PCP.   Marthenia Rolling, MSN, RN,BSN [place] Acute Care Coordinator [phone] (Direct dial)

## 2021-11-19 DIAGNOSIS — K219 Gastro-esophageal reflux disease without esophagitis: Secondary | ICD-10-CM | POA: Diagnosis not present

## 2021-11-19 DIAGNOSIS — R5381 Other malaise: Secondary | ICD-10-CM | POA: Diagnosis not present

## 2021-11-19 DIAGNOSIS — E559 Vitamin D deficiency, unspecified: Secondary | ICD-10-CM | POA: Diagnosis not present

## 2021-11-19 DIAGNOSIS — K5900 Constipation, unspecified: Secondary | ICD-10-CM | POA: Diagnosis not present

## 2021-11-19 DIAGNOSIS — E039 Hypothyroidism, unspecified: Secondary | ICD-10-CM | POA: Diagnosis not present

## 2021-11-19 DIAGNOSIS — J9611 Chronic respiratory failure with hypoxia: Secondary | ICD-10-CM | POA: Diagnosis not present

## 2021-11-19 DIAGNOSIS — I10 Essential (primary) hypertension: Secondary | ICD-10-CM | POA: Diagnosis not present

## 2021-11-21 DIAGNOSIS — M069 Rheumatoid arthritis, unspecified: Secondary | ICD-10-CM | POA: Diagnosis not present

## 2021-11-21 DIAGNOSIS — J9611 Chronic respiratory failure with hypoxia: Secondary | ICD-10-CM | POA: Diagnosis not present

## 2021-11-21 DIAGNOSIS — D649 Anemia, unspecified: Secondary | ICD-10-CM | POA: Diagnosis not present

## 2021-11-21 DIAGNOSIS — E039 Hypothyroidism, unspecified: Secondary | ICD-10-CM | POA: Diagnosis not present

## 2021-11-21 DIAGNOSIS — R5381 Other malaise: Secondary | ICD-10-CM | POA: Diagnosis not present

## 2021-11-21 DIAGNOSIS — S3210XD Unspecified fracture of sacrum, subsequent encounter for fracture with routine healing: Secondary | ICD-10-CM | POA: Diagnosis not present

## 2021-11-21 DIAGNOSIS — K5900 Constipation, unspecified: Secondary | ICD-10-CM | POA: Diagnosis not present

## 2021-11-21 DIAGNOSIS — H6123 Impacted cerumen, bilateral: Secondary | ICD-10-CM | POA: Diagnosis not present

## 2021-11-21 DIAGNOSIS — F32A Depression, unspecified: Secondary | ICD-10-CM | POA: Diagnosis not present

## 2021-11-21 DIAGNOSIS — S322XXD Fracture of coccyx, subsequent encounter for fracture with routine healing: Secondary | ICD-10-CM | POA: Diagnosis not present

## 2021-11-21 DIAGNOSIS — K219 Gastro-esophageal reflux disease without esophagitis: Secondary | ICD-10-CM | POA: Diagnosis not present

## 2021-11-21 DIAGNOSIS — E559 Vitamin D deficiency, unspecified: Secondary | ICD-10-CM | POA: Diagnosis not present

## 2021-11-22 ENCOUNTER — Other Ambulatory Visit: Payer: Self-pay | Admitting: *Deleted

## 2021-11-22 NOTE — Patient Outreach (Addendum)
[facility] Post- Acute Care Coordinator follow up. [name] resides in [facility]. Screening for potential [facility] care coordination services as benefit of insurance plan and PCP.   Telephone call made to daughter/ DPR [name] [phone] to discuss [facility] follow up. No answer. HIPAA compliant voicemail message left to request return call.   Will continue to follow while [name] resides in SNF.  Addendum: Telephone call received from [name] (daughter/DPR). Patient identifiers confirmed. Marcie Bal confirms [name]'s transition plan is to return home. Unsure of dc date from [place]. Explained [place] [facility] services. Marcie Bal asks for information to be mailed. Writer will ask [place] [facility] office.   Marthenia Rolling, MSN, RN,BSN [place] Acute Care Coordinator [phone] (Direct dial)

## 2021-11-26 DIAGNOSIS — S3210XD Unspecified fracture of sacrum, subsequent encounter for fracture with routine healing: Secondary | ICD-10-CM | POA: Diagnosis not present

## 2021-11-26 DIAGNOSIS — D649 Anemia, unspecified: Secondary | ICD-10-CM | POA: Diagnosis not present

## 2021-11-26 DIAGNOSIS — K219 Gastro-esophageal reflux disease without esophagitis: Secondary | ICD-10-CM | POA: Diagnosis not present

## 2021-11-26 DIAGNOSIS — E559 Vitamin D deficiency, unspecified: Secondary | ICD-10-CM | POA: Diagnosis not present

## 2021-11-26 DIAGNOSIS — I10 Essential (primary) hypertension: Secondary | ICD-10-CM | POA: Diagnosis not present

## 2021-11-26 DIAGNOSIS — J189 Pneumonia, unspecified organism: Secondary | ICD-10-CM | POA: Diagnosis not present

## 2021-11-26 DIAGNOSIS — H6123 Impacted cerumen, bilateral: Secondary | ICD-10-CM | POA: Diagnosis not present

## 2021-12-03 DIAGNOSIS — T8090XA Unspecified complication following infusion and therapeutic injection, initial encounter: Secondary | ICD-10-CM | POA: Diagnosis not present

## 2021-12-03 DIAGNOSIS — D649 Anemia, unspecified: Secondary | ICD-10-CM | POA: Diagnosis not present

## 2021-12-03 DIAGNOSIS — E538 Deficiency of other specified B group vitamins: Secondary | ICD-10-CM | POA: Diagnosis not present

## 2021-12-03 DIAGNOSIS — F419 Anxiety disorder, unspecified: Secondary | ICD-10-CM | POA: Diagnosis not present

## 2021-12-03 DIAGNOSIS — M069 Rheumatoid arthritis, unspecified: Secondary | ICD-10-CM | POA: Diagnosis not present

## 2021-12-03 DIAGNOSIS — S3210XD Unspecified fracture of sacrum, subsequent encounter for fracture with routine healing: Secondary | ICD-10-CM | POA: Diagnosis not present

## 2021-12-03 DIAGNOSIS — I10 Essential (primary) hypertension: Secondary | ICD-10-CM | POA: Diagnosis not present

## 2021-12-03 DIAGNOSIS — E039 Hypothyroidism, unspecified: Secondary | ICD-10-CM | POA: Diagnosis not present

## 2021-12-03 DIAGNOSIS — J189 Pneumonia, unspecified organism: Secondary | ICD-10-CM | POA: Diagnosis not present

## 2021-12-03 DIAGNOSIS — E559 Vitamin D deficiency, unspecified: Secondary | ICD-10-CM | POA: Diagnosis not present

## 2021-12-03 DIAGNOSIS — K219 Gastro-esophageal reflux disease without esophagitis: Secondary | ICD-10-CM | POA: Diagnosis not present

## 2021-12-03 DIAGNOSIS — G894 Chronic pain syndrome: Secondary | ICD-10-CM | POA: Diagnosis not present

## 2021-12-06 ENCOUNTER — Other Ambulatory Visit: Payer: Self-pay | Admitting: *Deleted

## 2021-12-06 DIAGNOSIS — K5900 Constipation, unspecified: Secondary | ICD-10-CM | POA: Diagnosis not present

## 2021-12-06 DIAGNOSIS — I10 Essential (primary) hypertension: Secondary | ICD-10-CM | POA: Diagnosis not present

## 2021-12-06 DIAGNOSIS — J9611 Chronic respiratory failure with hypoxia: Secondary | ICD-10-CM | POA: Diagnosis not present

## 2021-12-06 DIAGNOSIS — K219 Gastro-esophageal reflux disease without esophagitis: Secondary | ICD-10-CM | POA: Diagnosis not present

## 2021-12-06 DIAGNOSIS — D649 Anemia, unspecified: Secondary | ICD-10-CM | POA: Diagnosis not present

## 2021-12-06 DIAGNOSIS — M069 Rheumatoid arthritis, unspecified: Secondary | ICD-10-CM | POA: Diagnosis not present

## 2021-12-06 DIAGNOSIS — E559 Vitamin D deficiency, unspecified: Secondary | ICD-10-CM | POA: Diagnosis not present

## 2021-12-06 DIAGNOSIS — G894 Chronic pain syndrome: Secondary | ICD-10-CM | POA: Diagnosis not present

## 2021-12-06 DIAGNOSIS — J189 Pneumonia, unspecified organism: Secondary | ICD-10-CM | POA: Diagnosis not present

## 2021-12-06 DIAGNOSIS — F32A Depression, unspecified: Secondary | ICD-10-CM | POA: Diagnosis not present

## 2021-12-06 NOTE — Patient Outreach (Signed)
[facility] Post- Acute Care Coordinator follow up. Mrs. Rocco resides in [facility]. Screening for potential [facility] care coordination services as benefit of insurance plan and PCP.   Update received from Judson Roch, [place] worker indicating Mrs. Coggin will return home on 12/08/21 with home health for PT/OT/Nursing. Lives with daughter.   Will plan to follow up with daughter [name] to see if interested in [facility] care coordination services.   Will inquire with SNF social worker name of home health agency.   Marthenia Rolling, MSN, RN,BSN [place] Acute Care Coordinator [phone] (Direct dial)

## 2021-12-11 ENCOUNTER — Other Ambulatory Visit: Payer: Self-pay | Admitting: *Deleted

## 2021-12-11 DIAGNOSIS — M810 Age-related osteoporosis without current pathological fracture: Secondary | ICD-10-CM | POA: Diagnosis not present

## 2021-12-11 NOTE — Patient Outreach (Signed)
[facility]N Post- Acute Care Coordinator follow up. [name] discharged from [facility] on 12/08/21 with home health services. Lives with daughter [name].   Telephone call made to [name] (daughter/DPR). Patient identifiers confirmed. Offered [facility] care coordination follow up. Jocelyn Bauer declines services at this time. Conversation was brief. Jocelyn Bauer states she is at work.    Marthenia Rolling, MSN, RN,BSN [place] Acute Care Coordinator [phone] (Direct dial)

## 2021-12-25 DIAGNOSIS — I119 Hypertensive heart disease without heart failure: Secondary | ICD-10-CM | POA: Diagnosis not present

## 2021-12-25 DIAGNOSIS — J9611 Chronic respiratory failure with hypoxia: Secondary | ICD-10-CM | POA: Diagnosis not present

## 2021-12-25 DIAGNOSIS — R300 Dysuria: Secondary | ICD-10-CM | POA: Diagnosis not present

## 2021-12-25 DIAGNOSIS — Z9981 Dependence on supplemental oxygen: Secondary | ICD-10-CM | POA: Diagnosis not present

## 2021-12-25 DIAGNOSIS — G894 Chronic pain syndrome: Secondary | ICD-10-CM | POA: Diagnosis not present

## 2021-12-25 DIAGNOSIS — J441 Chronic obstructive pulmonary disease with (acute) exacerbation: Secondary | ICD-10-CM | POA: Diagnosis not present

## 2021-12-25 DIAGNOSIS — M810 Age-related osteoporosis without current pathological fracture: Secondary | ICD-10-CM | POA: Diagnosis not present

## 2021-12-25 DIAGNOSIS — S32131A Minimally displaced Zone III fracture of sacrum, initial encounter for closed fracture: Secondary | ICD-10-CM | POA: Diagnosis not present

## 2021-12-25 DIAGNOSIS — R5381 Other malaise: Secondary | ICD-10-CM | POA: Diagnosis not present

## 2021-12-25 DIAGNOSIS — M069 Rheumatoid arthritis, unspecified: Secondary | ICD-10-CM | POA: Diagnosis not present

## 2021-12-25 DIAGNOSIS — S322XXA Fracture of coccyx, initial encounter for closed fracture: Secondary | ICD-10-CM | POA: Diagnosis not present

## 2022-03-27 DIAGNOSIS — G894 Chronic pain syndrome: Secondary | ICD-10-CM | POA: Diagnosis not present

## 2022-03-27 DIAGNOSIS — M0579 Rheumatoid arthritis with rheumatoid factor of multiple sites without organ or systems involvement: Secondary | ICD-10-CM | POA: Diagnosis not present

## 2022-03-27 DIAGNOSIS — Z79899 Other long term (current) drug therapy: Secondary | ICD-10-CM | POA: Diagnosis not present

## 2022-03-27 DIAGNOSIS — D649 Anemia, unspecified: Secondary | ICD-10-CM | POA: Diagnosis not present

## 2022-03-27 DIAGNOSIS — M810 Age-related osteoporosis without current pathological fracture: Secondary | ICD-10-CM | POA: Diagnosis not present

## 2022-03-27 DIAGNOSIS — M1991 Primary osteoarthritis, unspecified site: Secondary | ICD-10-CM | POA: Diagnosis not present

## 2022-05-25 DIAGNOSIS — R0902 Hypoxemia: Secondary | ICD-10-CM | POA: Diagnosis not present

## 2022-05-25 DIAGNOSIS — M79604 Pain in right leg: Secondary | ICD-10-CM | POA: Diagnosis not present

## 2022-05-25 DIAGNOSIS — R1084 Generalized abdominal pain: Secondary | ICD-10-CM | POA: Diagnosis not present

## 2022-05-25 DIAGNOSIS — R609 Edema, unspecified: Secondary | ICD-10-CM | POA: Diagnosis not present

## 2022-05-26 ENCOUNTER — Encounter (HOSPITAL_COMMUNITY): Payer: Self-pay | Admitting: Emergency Medicine

## 2022-05-26 ENCOUNTER — Inpatient Hospital Stay (HOSPITAL_COMMUNITY)
Admission: EM | Admit: 2022-05-26 | Discharge: 2022-06-04 | DRG: 603 | Disposition: A | Discharge status: Skilled Nursing Facility | Payer: Medicare Other | Attending: Family Medicine | Admitting: Family Medicine

## 2022-05-26 ENCOUNTER — Emergency Department (HOSPITAL_COMMUNITY): Payer: Medicare Other

## 2022-05-26 ENCOUNTER — Other Ambulatory Visit: Payer: Self-pay

## 2022-05-26 DIAGNOSIS — L0390 Cellulitis, unspecified: Secondary | ICD-10-CM | POA: Diagnosis not present

## 2022-05-26 DIAGNOSIS — M810 Age-related osteoporosis without current pathological fracture: Secondary | ICD-10-CM | POA: Diagnosis present

## 2022-05-26 DIAGNOSIS — Z79891 Long term (current) use of opiate analgesic: Secondary | ICD-10-CM | POA: Diagnosis not present

## 2022-05-26 DIAGNOSIS — G8929 Other chronic pain: Secondary | ICD-10-CM | POA: Diagnosis present

## 2022-05-26 DIAGNOSIS — Z887 Allergy status to serum and vaccine status: Secondary | ICD-10-CM

## 2022-05-26 DIAGNOSIS — Z7952 Long term (current) use of systemic steroids: Secondary | ICD-10-CM

## 2022-05-26 DIAGNOSIS — D649 Anemia, unspecified: Secondary | ICD-10-CM | POA: Diagnosis not present

## 2022-05-26 DIAGNOSIS — R1310 Dysphagia, unspecified: Secondary | ICD-10-CM | POA: Diagnosis not present

## 2022-05-26 DIAGNOSIS — R911 Solitary pulmonary nodule: Secondary | ICD-10-CM | POA: Diagnosis present

## 2022-05-26 DIAGNOSIS — Z87440 Personal history of urinary (tract) infections: Secondary | ICD-10-CM

## 2022-05-26 DIAGNOSIS — J449 Chronic obstructive pulmonary disease, unspecified: Secondary | ICD-10-CM | POA: Diagnosis not present

## 2022-05-26 DIAGNOSIS — J9611 Chronic respiratory failure with hypoxia: Secondary | ICD-10-CM | POA: Diagnosis present

## 2022-05-26 DIAGNOSIS — E785 Hyperlipidemia, unspecified: Secondary | ICD-10-CM | POA: Diagnosis present

## 2022-05-26 DIAGNOSIS — Z751 Person awaiting admission to adequate facility elsewhere: Secondary | ICD-10-CM

## 2022-05-26 DIAGNOSIS — N200 Calculus of kidney: Secondary | ICD-10-CM | POA: Diagnosis present

## 2022-05-26 DIAGNOSIS — R262 Difficulty in walking, not elsewhere classified: Secondary | ICD-10-CM | POA: Diagnosis present

## 2022-05-26 DIAGNOSIS — Z9981 Dependence on supplemental oxygen: Secondary | ICD-10-CM | POA: Diagnosis not present

## 2022-05-26 DIAGNOSIS — Z66 Do not resuscitate: Secondary | ICD-10-CM | POA: Diagnosis present

## 2022-05-26 DIAGNOSIS — R531 Weakness: Secondary | ICD-10-CM | POA: Diagnosis not present

## 2022-05-26 DIAGNOSIS — R41841 Cognitive communication deficit: Secondary | ICD-10-CM | POA: Diagnosis not present

## 2022-05-26 DIAGNOSIS — Z79631 Long term (current) use of antimetabolite agent: Secondary | ICD-10-CM

## 2022-05-26 DIAGNOSIS — Z825 Family history of asthma and other chronic lower respiratory diseases: Secondary | ICD-10-CM

## 2022-05-26 DIAGNOSIS — J849 Interstitial pulmonary disease, unspecified: Secondary | ICD-10-CM | POA: Diagnosis present

## 2022-05-26 DIAGNOSIS — L03115 Cellulitis of right lower limb: Principal | ICD-10-CM | POA: Diagnosis present

## 2022-05-26 DIAGNOSIS — Z91041 Radiographic dye allergy status: Secondary | ICD-10-CM

## 2022-05-26 DIAGNOSIS — Z79890 Hormone replacement therapy (postmenopausal): Secondary | ICD-10-CM

## 2022-05-26 DIAGNOSIS — R2681 Unsteadiness on feet: Secondary | ICD-10-CM | POA: Diagnosis not present

## 2022-05-26 DIAGNOSIS — B351 Tinea unguium: Secondary | ICD-10-CM | POA: Diagnosis not present

## 2022-05-26 DIAGNOSIS — M6281 Muscle weakness (generalized): Secondary | ICD-10-CM | POA: Diagnosis not present

## 2022-05-26 DIAGNOSIS — R600 Localized edema: Secondary | ICD-10-CM | POA: Diagnosis not present

## 2022-05-26 DIAGNOSIS — Z885 Allergy status to narcotic agent status: Secondary | ICD-10-CM

## 2022-05-26 DIAGNOSIS — Z888 Allergy status to other drugs, medicaments and biological substances status: Secondary | ICD-10-CM

## 2022-05-26 DIAGNOSIS — E890 Postprocedural hypothyroidism: Secondary | ICD-10-CM | POA: Diagnosis present

## 2022-05-26 DIAGNOSIS — K219 Gastro-esophageal reflux disease without esophagitis: Secondary | ICD-10-CM | POA: Diagnosis present

## 2022-05-26 DIAGNOSIS — E039 Hypothyroidism, unspecified: Secondary | ICD-10-CM | POA: Diagnosis present

## 2022-05-26 DIAGNOSIS — Z8701 Personal history of pneumonia (recurrent): Secondary | ICD-10-CM

## 2022-05-26 DIAGNOSIS — Z9181 History of falling: Secondary | ICD-10-CM | POA: Diagnosis not present

## 2022-05-26 DIAGNOSIS — F419 Anxiety disorder, unspecified: Secondary | ICD-10-CM | POA: Diagnosis present

## 2022-05-26 DIAGNOSIS — L03311 Cellulitis of abdominal wall: Secondary | ICD-10-CM | POA: Diagnosis not present

## 2022-05-26 DIAGNOSIS — M069 Rheumatoid arthritis, unspecified: Secondary | ICD-10-CM | POA: Diagnosis present

## 2022-05-26 DIAGNOSIS — Z7982 Long term (current) use of aspirin: Secondary | ICD-10-CM

## 2022-05-26 DIAGNOSIS — R1030 Lower abdominal pain, unspecified: Secondary | ICD-10-CM

## 2022-05-26 DIAGNOSIS — M549 Dorsalgia, unspecified: Secondary | ICD-10-CM | POA: Diagnosis present

## 2022-05-26 DIAGNOSIS — R278 Other lack of coordination: Secondary | ICD-10-CM | POA: Diagnosis not present

## 2022-05-26 DIAGNOSIS — E876 Hypokalemia: Secondary | ICD-10-CM | POA: Diagnosis present

## 2022-05-26 DIAGNOSIS — Z79899 Other long term (current) drug therapy: Secondary | ICD-10-CM

## 2022-05-26 DIAGNOSIS — R609 Edema, unspecified: Secondary | ICD-10-CM | POA: Diagnosis not present

## 2022-05-26 DIAGNOSIS — Z96622 Presence of left artificial elbow joint: Secondary | ICD-10-CM | POA: Diagnosis present

## 2022-05-26 DIAGNOSIS — Z96621 Presence of right artificial elbow joint: Secondary | ICD-10-CM | POA: Diagnosis present

## 2022-05-26 DIAGNOSIS — R296 Repeated falls: Secondary | ICD-10-CM | POA: Diagnosis not present

## 2022-05-26 DIAGNOSIS — R1031 Right lower quadrant pain: Secondary | ICD-10-CM | POA: Diagnosis not present

## 2022-05-26 DIAGNOSIS — Z8249 Family history of ischemic heart disease and other diseases of the circulatory system: Secondary | ICD-10-CM

## 2022-05-26 DIAGNOSIS — I10 Essential (primary) hypertension: Secondary | ICD-10-CM | POA: Diagnosis not present

## 2022-05-26 DIAGNOSIS — Z7401 Bed confinement status: Secondary | ICD-10-CM | POA: Diagnosis not present

## 2022-05-26 DIAGNOSIS — Z8042 Family history of malignant neoplasm of prostate: Secondary | ICD-10-CM

## 2022-05-26 DIAGNOSIS — M542 Cervicalgia: Secondary | ICD-10-CM | POA: Diagnosis present

## 2022-05-26 DIAGNOSIS — Z87891 Personal history of nicotine dependence: Secondary | ICD-10-CM

## 2022-05-26 DIAGNOSIS — Z96642 Presence of left artificial hip joint: Secondary | ICD-10-CM | POA: Diagnosis present

## 2022-05-26 LAB — COMPREHENSIVE METABOLIC PANEL
ALT: 21 U/L (ref 0–44)
AST: 43 U/L — ABNORMAL HIGH (ref 15–41)
Albumin: 3.6 g/dL (ref 3.5–5.0)
Alkaline Phosphatase: 42 U/L (ref 38–126)
Anion gap: 9 (ref 5–15)
BUN: 21 mg/dL (ref 8–23)
CO2: 25 mmol/L (ref 22–32)
Calcium: 8.5 mg/dL — ABNORMAL LOW (ref 8.9–10.3)
Chloride: 102 mmol/L (ref 98–111)
Creatinine, Ser: 0.85 mg/dL (ref 0.44–1.00)
GFR, Estimated: >60 mL/min (ref >60)
Glucose, Bld: 121 mg/dL — ABNORMAL HIGH (ref 70–99)
Potassium: 3.5 mmol/L (ref 3.5–5.1)
Sodium: 136 mmol/L (ref 135–145)
Total Bilirubin: 0.6 mg/dL (ref 0.3–1.2)
Total Protein: 6.8 g/dL (ref 6.5–8.1)

## 2022-05-26 LAB — CBC WITH DIFFERENTIAL/PLATELET
Abs Immature Granulocytes: 0.06 10*3/uL (ref 0.00–0.07)
Basophils Absolute: 0 10*3/uL (ref 0.0–0.1)
Basophils Relative: 0 %
Eosinophils Absolute: 0.1 10*3/uL (ref 0.0–0.5)
Eosinophils Relative: 1 %
HCT: 30.9 % — ABNORMAL LOW (ref 36.0–46.0)
Hemoglobin: 9.3 g/dL — ABNORMAL LOW (ref 12.0–15.0)
Immature Granulocytes: 1 %
Lymphocytes Relative: 9 %
Lymphs Abs: 0.7 10*3/uL (ref 0.7–4.0)
MCH: 29.3 pg (ref 26.0–34.0)
MCHC: 30.1 g/dL (ref 30.0–36.0)
MCV: 97.5 fL (ref 80.0–100.0)
Monocytes Absolute: 0.3 10*3/uL (ref 0.1–1.0)
Monocytes Relative: 3 %
Neutro Abs: 6.7 10*3/uL (ref 1.7–7.7)
Neutrophils Relative %: 86 %
Platelets: 412 10*3/uL — ABNORMAL HIGH (ref 150–400)
RBC: 3.17 MIL/uL — ABNORMAL LOW (ref 3.87–5.11)
RDW: 22.2 % — ABNORMAL HIGH (ref 11.5–15.5)
WBC: 7.8 10*3/uL (ref 4.0–10.5)
nRBC: 0 % (ref 0.0–0.2)

## 2022-05-26 LAB — URINALYSIS, ROUTINE W REFLEX MICROSCOPIC
Bacteria, UA: NONE SEEN
Bilirubin Urine: NEGATIVE
Glucose, UA: NEGATIVE mg/dL
Hgb urine dipstick: NEGATIVE
Ketones, ur: NEGATIVE mg/dL
Nitrite: NEGATIVE
Protein, ur: NEGATIVE mg/dL
Specific Gravity, Urine: 1.015 (ref 1.005–1.030)
pH: 7 (ref 5.0–8.0)

## 2022-05-26 LAB — TROPONIN I (HIGH SENSITIVITY): Troponin I (High Sensitivity): 13 ng/L (ref <18)

## 2022-05-26 LAB — LACTIC ACID, PLASMA: Lactic Acid, Venous: 1 mmol/L (ref 0.5–1.9)

## 2022-05-26 LAB — BRAIN NATRIURETIC PEPTIDE: B Natriuretic Peptide: 49.7 pg/mL (ref 0.0–100.0)

## 2022-05-26 LAB — LIPASE, BLOOD: Lipase: 24 U/L (ref 11–51)

## 2022-05-26 MED ORDER — SODIUM CHLORIDE 0.9 % IV SOLN
1.0000 g | INTRAVENOUS | Status: DC
  Administered 2022-05-26 – 2022-05-27 (×2): 1 g via INTRAVENOUS
  Filled 2022-05-26 (×2): qty 10

## 2022-05-26 MED ORDER — ONDANSETRON HCL 4 MG/2ML IJ SOLN: 4.0000 mg | Freq: Four times a day (QID) | INTRAMUSCULAR | Status: DC | PRN

## 2022-05-26 MED ORDER — ALBUTEROL SULFATE (2.5 MG/3ML) 0.083% IN NEBU: 2.5000 mg | INHALATION_SOLUTION | RESPIRATORY_TRACT | Status: DC | PRN

## 2022-05-26 MED ORDER — VITAMIN D 25 MCG (1000 UNIT) PO TABS
2000.0000 [IU] | ORAL_TABLET | Freq: Every day | ORAL | Status: DC
  Administered 2022-05-26 – 2022-06-04 (×10): 2000 [IU] via ORAL
  Filled 2022-05-26 (×10): qty 2

## 2022-05-26 MED ORDER — CEFAZOLIN SODIUM-DEXTROSE 1-4 GM/50ML-% IV SOLN
1.0000 g | Freq: Once | INTRAVENOUS | Status: AC
  Administered 2022-05-26: 1 g via INTRAVENOUS
  Filled 2022-05-26: qty 50

## 2022-05-26 MED ORDER — POLYETHYLENE GLYCOL 3350 17 G PO PACK: 17.0000 g | PACK | Freq: Every day | ORAL | Status: DC | PRN

## 2022-05-26 MED ORDER — OXYCODONE HCL 5 MG PO TABS
5.0000 mg | ORAL_TABLET | Freq: Four times a day (QID) | ORAL | Status: DC | PRN
  Administered 2022-05-28 – 2022-06-04 (×13): 5 mg via ORAL
  Filled 2022-05-26 (×13): qty 1

## 2022-05-26 MED ORDER — OXYCODONE-ACETAMINOPHEN 5-325 MG PO TABS
1.0000 | ORAL_TABLET | Freq: Four times a day (QID) | ORAL | Status: DC | PRN
  Administered 2022-05-27 – 2022-06-04 (×16): 1 via ORAL
  Filled 2022-05-26 (×18): qty 1

## 2022-05-26 MED ORDER — ACETAMINOPHEN 325 MG PO TABS: 650.0000 mg | ORAL_TABLET | Freq: Four times a day (QID) | ORAL | Status: DC | PRN

## 2022-05-26 MED ORDER — PREDNISONE 5 MG PO TABS
5.0000 mg | ORAL_TABLET | Freq: Every day | ORAL | Status: DC
  Administered 2022-05-26 – 2022-06-04 (×10): 5 mg via ORAL
  Filled 2022-05-26 (×10): qty 1

## 2022-05-26 MED ORDER — FENTANYL 50 MCG/HR TD PT72
1.0000 | MEDICATED_PATCH | TRANSDERMAL | Status: DC
  Administered 2022-05-26 – 2022-06-01 (×3): 1 via TRANSDERMAL
  Filled 2022-05-26 (×3): qty 1

## 2022-05-26 MED ORDER — LORAZEPAM 0.5 MG PO TABS
0.5000 mg | ORAL_TABLET | Freq: Two times a day (BID) | ORAL | Status: DC
  Administered 2022-05-26 – 2022-06-04 (×18): 0.5 mg via ORAL
  Filled 2022-05-26 (×18): qty 1

## 2022-05-26 MED ORDER — AMLODIPINE BESYLATE 5 MG PO TABS
5.0000 mg | ORAL_TABLET | Freq: Every day | ORAL | Status: DC
  Administered 2022-05-26 – 2022-06-04 (×10): 5 mg via ORAL
  Filled 2022-05-26 (×10): qty 1

## 2022-05-26 MED ORDER — ACETAMINOPHEN 650 MG RE SUPP: 650.0000 mg | Freq: Four times a day (QID) | RECTAL | Status: DC | PRN

## 2022-05-26 MED ORDER — ONDANSETRON HCL 4 MG PO TABS: 4.0000 mg | ORAL_TABLET | Freq: Four times a day (QID) | ORAL | Status: DC | PRN

## 2022-05-26 MED ORDER — VITAMIN B-12 1000 MCG PO TABS
1000.0000 ug | ORAL_TABLET | Freq: Every day | ORAL | Status: DC
  Administered 2022-05-26 – 2022-06-04 (×10): 1000 ug via ORAL
  Filled 2022-05-26 (×10): qty 1

## 2022-05-26 MED ORDER — LEVOTHYROXINE SODIUM 25 MCG PO TABS
125.0000 ug | ORAL_TABLET | Freq: Every day | ORAL | Status: DC
  Administered 2022-05-27 – 2022-06-04 (×9): 125 ug via ORAL
  Filled 2022-05-26: qty 2
  Filled 2022-05-26 (×9): qty 1

## 2022-05-26 MED ORDER — BISACODYL 5 MG PO TBEC: 5.0000 mg | DELAYED_RELEASE_TABLET | Freq: Every day | ORAL | Status: DC | PRN

## 2022-05-26 MED ORDER — METHOCARBAMOL 500 MG PO TABS: 500.0000 mg | ORAL_TABLET | Freq: Two times a day (BID) | ORAL | Status: DC | PRN

## 2022-05-26 MED ORDER — PANTOPRAZOLE SODIUM 40 MG PO TBEC
40.0000 mg | DELAYED_RELEASE_TABLET | Freq: Every day | ORAL | Status: DC
  Administered 2022-05-27 – 2022-06-04 (×9): 40 mg via ORAL
  Filled 2022-05-26 (×9): qty 1

## 2022-05-26 MED ORDER — POTASSIUM CHLORIDE CRYS ER 20 MEQ PO TBCR: 20.0000 meq | EXTENDED_RELEASE_TABLET | Freq: Every day | ORAL | Status: DC

## 2022-05-26 MED ORDER — MORPHINE SULFATE (PF) 2 MG/ML IV SOLN
1.0000 mg | INTRAVENOUS | Status: DC | PRN
  Administered 2022-05-26: 1 mg via INTRAVENOUS
  Filled 2022-05-26: qty 1

## 2022-05-26 MED ORDER — BENAZEPRIL HCL 20 MG PO TABS
20.0000 mg | ORAL_TABLET | Freq: Every morning | ORAL | Status: DC
  Administered 2022-05-26 – 2022-06-04 (×10): 20 mg via ORAL
  Filled 2022-05-26 (×11): qty 1

## 2022-05-26 MED ORDER — SERTRALINE HCL 50 MG PO TABS
50.0000 mg | ORAL_TABLET | Freq: Every day | ORAL | Status: DC
  Administered 2022-05-26 – 2022-06-04 (×10): 50 mg via ORAL
  Filled 2022-05-26 (×7): qty 2
  Filled 2022-05-26: qty 1
  Filled 2022-05-26 (×2): qty 2

## 2022-05-26 MED ORDER — OXYCODONE-ACETAMINOPHEN 10-325 MG PO TABS: 1.0000 | ORAL_TABLET | Freq: Four times a day (QID) | ORAL | Status: DC | PRN

## 2022-05-26 NOTE — ED Notes (Signed)
ED TO INPATIENT HANDOFF REPORT  ED Nurse Name and Phone #: Mellody Dance, Paramedic  S Name/Age/Gender Jocelyn Bauer 89 y.o. female Room/Bed: WA18/WA18  Code Status   Code Status: Prior  Home/SNF/Other Home Patient oriented to: self, place, time, and situation Is this baseline? Yes   Triage Complete: Triage complete  Chief Complaint Cellulitis of right lower extremity [L03.115]  Triage Note Pt arrived from home via [facility] c/o abd pain 7/10 LRQ that hurts down in to the groin area and down the right leg. RLE noted to be bright red, +3 pitting edema, and weeping at present. Pt in self ambulatory at baseline and is unable to stand by self today.   Allergies Allergies  Allergen Reactions   Contrast Media [Iodinated Contrast Media] Other (See Comments)    Itching.( 13 hr pre medication regimen worked fine. )   Tdap [Tetanus-Diphth-Acell Pertussis] Hives   Cholestyramine     Other reaction(s): stomach felt too full.   Duloxetine Hcl     Other reaction(s): didn't sleep  but just took one pill   Etidronate     Other reaction(s): diarrhea   Ezetimibe-Simvastatin     Other reaction(s): Unknown   Hydromorphone     Other reaction(s): Unknown   Pravastatin     Other reaction(s): with daily dose just hurt more.   Streptococcus (Diplococcus) Pneumoniae [Streptococci] Other (See Comments)    Shortness of breath    Level of Care/Admitting Diagnosis ED Disposition     ED Disposition  Admit   Condition  --   Comment  [facility]ospital Area: [facility] [[id number]]  Level of Care: Med-Surg [16]  May admit patient to [facility] or [facility] if equivalent level of care is available:: No  Covid Evaluation: Asymptomatic - no recent exposure (last 10 days) testing not required  Diagnosis: Cellulitis of right lower extremity [161096]  Admitting Physician: Bobette Mo [[id number]]  Attending Physician: Bobette Mo [[id number]]  Certification:: I certify this patient  will need inpatient services for at least 2 midnights  Estimated Length of Stay: 2          B Medical/Surgery History Past Medical History:  Diagnosis Date   Anxiety    HX OF PANIC ATTACKS   Arthritis    RA   Bilateral ankle fractures 07/26/2019   Chronic neck and back pain    Chronic pain    Chronic, continuous use of opioids 07/28/2019   Elbow locking    due to RA- elbow with limited extension   GERD (gastroesophageal reflux disease)    Headache    Hyperlipidemia    Hypertension    Hypothyroidism    Osteoarthritis    Osteoporosis    Pneumonia    Shortness of breath    occas   UTI (urinary tract infection)    Past Surgical History:  Procedure Laterality Date   CATARACT EXTRACTION Bilateral    COLONOSCOPY     ELBOW SURGERY Right    ELBOW SURGERY Left    ESOPHAGOGASTRODUODENOSCOPY     HARDWARE REMOVAL Right 07/13/2015   Procedure:  WITH HARDWARE REMOVAL REPAIR AS NECESSARY;  Surgeon: Dominica Severin, MD;  Location: [facility] OR;  Service: Orthopedics;  Laterality: Right;   HARDWARE REMOVAL Right 01/22/2021   Procedure: HARDWARE REMOVAL;  Surgeon: Myrene Galas, MD;  Location: [facility] OR;  Service: Orthopedics;  Laterality: Right;   JOINT REPLACEMENT  1998   left hip replacement and bilateral elbow replacements  about 15 yrs ago  ORIF ANKLE FRACTURE Bilateral 07/27/2019   Procedure: OPEN REDUCTION INTERNAL FIXATION (ORIF) ANKLE FRACTURE;  Surgeon: Myrene Galas, MD;  Location: [facility] OR;  Service: Orthopedics;  Laterality: Bilateral;   thyroidectomy     TOTAL ELBOW ARTHROPLASTY Right 07/13/2015   Procedure: RIGHT ELBOW REVISION TOTAL ELBOW ARTHROPLASTY ;  Surgeon: Dominica Severin, MD;  Location: [facility] OR;  Service: Orthopedics;  Laterality: Right;   TOTAL HIP ARTHROPLASTY     TOTAL HIP REVISION  03/11/2011   Procedure: TOTAL HIP REVISION;  Surgeon: [name], MD;  Location: [facility] ORS;  Service: Orthopedics;  Laterality: Left;   TRICEPS TENDON REPAIR Right 07/13/2015   Procedure:  TRICEP  RECONSTRUCTION REPAIR WITH ALLOGRAFT, ULNAR NERVE NEUROLYSIS;  Surgeon: Dominica Severin, MD;  Location: [facility] OR;  Service: Orthopedics;  Laterality: Right;     A IV Location/Drains/Wounds Patient Lines/Drains/Airways Status     Active Line/Drains/Airways     Name Placement date Placement time Site Days   Peripheral IV 05/26/22 20 G 1" Right Antecubital 05/26/22  0036  Antecubital  less than 1   External Urinary Catheter 07/02/21  1247  --  328   Incision (Closed) 07/13/15 Arm Right 07/13/15  1742  -- 2509   Incision (Closed) 07/27/19 Leg Right 07/27/19  0958  -- 1034   Incision (Closed) 07/27/19 Leg Left 07/27/19  0958  -- 1034   Incision (Closed) 01/22/21 Leg 01/22/21  1015  -- 489            Intake/Output Last 24 hours  Intake/Output Summary (Last 24 hours) at 05/26/2022 0837 Last data filed at 05/26/2022 0704 Gross per 24 hour  Intake 50 ml  Output --  Net 50 ml    Labs/Imaging Results for orders placed or performed during the hospital encounter of 05/26/22 (from the past 48 hour(s))  Comprehensive metabolic panel     Status: Abnormal   Collection Time: 05/26/22 12:20 AM  Result Value Ref Range   Sodium 136 135 - 145 mmol/L   Potassium 3.5 3.5 - 5.1 mmol/L   Chloride 102 98 - 111 mmol/L   CO2 25 22 - 32 mmol/L   Glucose, Bld 121 (H) 70 - 99 mg/dL    Comment: Glucose reference range applies only to samples taken after fasting for at least 8 hours.   BUN 21 8 - 23 mg/dL   Creatinine, Ser 1.61 0.44 - 1.00 mg/dL   Calcium 8.5 (L) 8.9 - 10.3 mg/dL   Total Protein 6.8 6.5 - 8.1 g/dL   Albumin 3.6 3.5 - 5.0 g/dL   AST 43 (H) 15 - 41 U/L   ALT 21 0 - 44 U/L   Alkaline Phosphatase 42 38 - 126 U/L   Total Bilirubin 0.6 0.3 - 1.2 mg/dL   GFR, Estimated >09 >60 mL/min    Comment: (NOTE) Calculated using the CKD-EPI Creatinine Equation (2021)    Anion gap 9 5 - 15    Comment: Performed at [facility], [address] [place], [place] [address]  CBC with  Differential     Status: Abnormal   Collection Time: 05/26/22 12:20 AM  Result Value Ref Range   WBC 7.8 4.0 - 10.5 K/uL   RBC 3.17 (L) 3.87 - 5.11 MIL/uL   Hemoglobin 9.3 (L) 12.0 - 15.0 g/dL   HCT 81.1 (L) 91.4 - 78.2 %   MCV 97.5 80.0 - 100.0 fL   MCH 29.3 26.0 - 34.0 pg   MCHC 30.1 30.0 - 36.0  g/dL   RDW 09.8 (H) 11.9 - 14.7 %   Platelets 412 (H) 150 - 400 K/uL   nRBC 0.0 0.0 - 0.2 %   Neutrophils Relative % 86 %   Neutro Abs 6.7 1.7 - 7.7 K/uL   Lymphocytes Relative 9 %   Lymphs Abs 0.7 0.7 - 4.0 K/uL   Monocytes Relative 3 %   Monocytes Absolute 0.3 0.1 - 1.0 K/uL   Eosinophils Relative 1 %   Eosinophils Absolute 0.1 0.0 - 0.5 K/uL   Basophils Relative 0 %   Basophils Absolute 0.0 0.0 - 0.1 K/uL   Immature Granulocytes 1 %   Abs Immature Granulocytes 0.06 0.00 - 0.07 K/uL   Polychromasia PRESENT    Ovalocytes PRESENT     Comment: Performed at [facility], [address] [place], [place] [address]  Lactic acid, plasma     Status: None   Collection Time: 05/26/22 12:20 AM  Result Value Ref Range   Lactic Acid, Venous 1.0 0.5 - 1.9 mmol/L    Comment: Performed at [facility], [address], [place], [place] [address]  Brain natriuretic peptide     Status: None   Collection Time: 05/26/22 12:20 AM  Result Value Ref Range   B Natriuretic Peptide 49.7 0.0 - 100.0 pg/mL    Comment: Performed at [facility], [address] [place], [place] [address]  Troponin I (High Sensitivity)     Status: None   Collection Time: 05/26/22 12:20 AM  Result Value Ref Range   Troponin I (High Sensitivity) 13 <18 ng/L    Comment: (NOTE) Elevated high sensitivity troponin I (hsTnI) values and significant  changes across serial measurements may suggest ACS but many other  chronic and acute conditions are known to elevate hsTnI results.  Refer to the "Links" section for chest pain algorithms and additional  guidance. Performed at  [facility], [address] [place], [place] [address]   Lipase, blood     Status: None   Collection Time: 05/26/22 12:20 AM  Result Value Ref Range   Lipase 24 11 - 51 U/L    Comment: Performed at [facility], [address] [place], [place] [address]   DG Chest Port 1 View  Result Date: 05/26/2022 CLINICAL DATA:  Weakness, peripheral edema EXAM: PORTABLE CHEST 1 VIEW COMPARISON:  11/02/2021 FINDINGS: Minimal left basilar parenchymal scarring. Lungs are otherwise clear. No pneumothorax or pleural effusion. Cardiac size within normal limits. Pulmonary vascularity is normal. No acute bone abnormality. IMPRESSION: 1. No active disease. Electronically Signed   By: Helyn Numbers M.D.   On: 05/26/2022 02:33   CT ABDOMEN PELVIS WO CONTRAST  Result Date: 05/26/2022 CLINICAL DATA:  Abdominal pain. EXAM: CT ABDOMEN AND PELVIS WITHOUT CONTRAST TECHNIQUE: Multidetector CT imaging of the abdomen and pelvis was performed following the standard protocol without IV contrast. RADIATION DOSE REDUCTION: This exam was performed according to the departmental dose-optimization program which includes automated exposure control, adjustment of the mA and/or kV according to patient size and/or use of iterative reconstruction technique. COMPARISON:  CT abdomen pelvis dated 11/02/2021. FINDINGS: Evaluation of this exam is limited in the absence of intravenous contrast. Evaluation is also limited due to respiratory motion and streak artifact caused by patient's arms. Lower chest: A 5 mm left lung base nodule, present on the prior CT. There is coronary vascular calcification. No intra-abdominal free air or free fluid. Hepatobiliary: The liver is unremarkable.  No biliary ductal dilatation. The gallbladder is unremarkable. Pancreas: Unremarkable. No pancreatic ductal dilatation or surrounding inflammatory changes. Spleen: Normal in size without focal abnormality. Adrenals/Urinary Tract:  The adrenal glands are unremarkable. Small nonobstructing bilateral renal calculi. There is no hydronephrosis or obstructing stone. The visualized ureters and urinary bladder appear unremarkable. Stomach/Bowel: There is no bowel obstruction or active inflammation. Small hiatal hernia. The appendix is not visualized with certainty. No inflammatory changes identified in the right lower quadrant. Vascular/Lymphatic: Moderate aortoiliac atherosclerotic disease. The IVC is unremarkable. No portal venous gas. There is no adenopathy. Reproductive: The uterus is grossly unremarkable. No adnexal masses. Other: None Musculoskeletal: Osteopenia with degenerative changes of the spine. Total left hip arthroplasty. Compression fracture of inferior endplate of L4, progressed since the prior CT, age indeterminate. Correlation with clinical exam and point tenderness recommended. IMPRESSION: 1. No bowel obstruction. 2. Small nonobstructing bilateral renal calculi. No hydronephrosis or obstructing stone. 3. Compression fracture of inferior endplate of L4, progressed since the prior CT, age indeterminate. Correlation with clinical exam and point tenderness recommended. 4. A 5 mm left lung base nodule, present on the prior CT. 5.  Aortic Atherosclerosis (ICD10-I70.0). Electronically Signed   By: Elgie Collard M.D.   On: 05/26/2022 02:30    Pending Labs Unresulted Labs (From admission, onward)     Start     Ordered   05/26/22 0024  Urinalysis, Routine w reflex microscopic -Urine, Clean Catch  Once,   URGENT       Question:  Specimen Source  Answer:  Urine, Clean Catch   05/26/22 0023            Vitals/Pain Today's Vitals   05/26/22 0515 05/26/22 0738 05/26/22 0740 05/26/22 0745  BP: (!) 112/99   (!) 157/78  Pulse: 91   85  Resp: 18   20  Temp:  (!) 97.3 F (36.3 C)    TempSrc:  Oral    SpO2: 98%   96%  Weight:      Height:      PainSc:   8      Isolation Precautions No active  isolations  Medications Medications  morphine (PF) 2 MG/ML injection 1 mg (1 mg Intravenous Given 05/26/22 0745)  predniSONE (DELTASONE) tablet 5 mg (has no administration in time range)  ceFAZolin (ANCEF) IVPB 1 g/50 mL premix (0 g Intravenous Stopped 05/26/22 0704)    Mobility manual wheelchair     Focused Assessments   R Recommendations: See Admitting Provider Note  Report given to:   Additional Notes:

## 2022-05-26 NOTE — H&P (Signed)
History and Physical    Patient: Jocelyn Bauer ZOX:[id number] DOB: 10-May-1928 DOA: 05/26/2022 DOS: the patient was seen and examined on 05/26/2022 PCP: Rodrigo Ran, MD  Patient coming from: Home  Chief Complaint:  Chief Complaint  Patient presents with   Abdominal Pain   HPI: Jocelyn Bauer is a 89 y.o. 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  No fever, chills or night sweats. No sore throat, rhinorrhea, dyspnea, wheezing or hemoptysis.  No chest pain, palpitations, diaphoresis, PND, orthopnea or pitting edema of the lower extremities.  No appetite changes, abdominal pain, nausea, emesis, diarrhea, melena or hematochezia.  She gets occasionally constipated.  No flank pain, dysuria, frequency or hematuria.  No polyuria, polydipsia, polyphagia or blurred vision.  ED course: Initial vital signs were temperature 98.2 F, pulse 100, respiration 20, BP 156/72 mmHg O2 sat 97% on room air.  The patient received cefazolin 1 g IVPB and 1 mg of morphine IVP.  Lab work: CBC showed a white count of 7.8 with 86% neutrophils, hemoglobin 9.3 g/dL platelets 811.  Lactic acid, lipase, troponin and BNP were normal.  CMP showed a glucose of 121 mg deciliter and AST of 43 units/L.  The rest of the CMP measurements are normal after calcium correction.  Imaging: Portable 1 view chest radiograph with no active disease.  There is minimal left basilar  parenchymal scaring.  Cardiac size within normal limits.  Pulmonary vascularity is normal.  CT abdomen/pelvis without contrast with no bowel obstructions.  There is a small nonobstructing bilateral renal calculi.  No hydronephrosis or obstructing stone.  Compression fracture of inferior endplate of L4, progressed since the prior CT, age undetermined.  There is a 5 mm left lung base nodule that was present on the prior CT.  Aortic atherosclerosis.  Review of Systems: As mentioned in the history of present illness. All other systems reviewed and are negative.  Past Medical History:  Diagnosis Date   Anxiety    HX OF PANIC ATTACKS   Arthritis    RA   Bilateral ankle fractures 07/26/2019   Chronic neck and back pain    Chronic pain    Chronic, continuous use of opioids 07/28/2019   Elbow locking    due to RA- elbow with limited extension   GERD (gastroesophageal reflux disease)    Headache    Hyperlipidemia    Hypertension    Hypothyroidism    Osteoarthritis    Osteoporosis    Pneumonia    Shortness of breath    occas   UTI (urinary tract infection)    Past Surgical History:  Procedure Laterality Date   CATARACT EXTRACTION Bilateral    COLONOSCOPY     ELBOW SURGERY Right    ELBOW SURGERY Left    ESOPHAGOGASTRODUODENOSCOPY     HARDWARE REMOVAL Right 07/13/2015   Procedure:  WITH HARDWARE REMOVAL REPAIR AS NECESSARY;  Surgeon: Dominica Severin, MD;  Location: [facility] OR;  Service: Orthopedics;  Laterality: Right;   HARDWARE REMOVAL Right 01/22/2021   Procedure: HARDWARE REMOVAL;  Surgeon: Myrene Galas, MD;  Location: [facility] OR;  Service: Orthopedics;  Laterality: Right;   JOINT REPLACEMENT  1998   left hip replacement and bilateral elbow replacements  about 15 yrs ago   ORIF ANKLE FRACTURE Bilateral 07/27/2019   Procedure: OPEN REDUCTION INTERNAL FIXATION (ORIF) ANKLE FRACTURE;  Surgeon: Myrene Galas, MD;  Location: [facility] OR;  Service: Orthopedics;  Laterality: Bilateral;   thyroidectomy      TOTAL ELBOW ARTHROPLASTY Right 07/13/2015   Procedure: RIGHT ELBOW REVISION TOTAL ELBOW ARTHROPLASTY ;  Surgeon: Dominica Severin, MD;  Location: [facility] OR;  Service: Orthopedics;  Laterality: Right;   TOTAL HIP ARTHROPLASTY     TOTAL HIP REVISION  03/11/2011   Procedure: TOTAL HIP REVISION;  Surgeon: [name], MD;  Location: [facility] ORS;  Service: Orthopedics;  Laterality: Left;   TRICEPS TENDON REPAIR Right 07/13/2015   Procedure:  TRICEP RECONSTRUCTION REPAIR WITH ALLOGRAFT, ULNAR NERVE NEUROLYSIS;  Surgeon: Dominica Severin, MD;  Location: [facility] OR;  Service: Orthopedics;  Laterality: Right;   Social History:  reports that she has quit smoking. Her smoking use included cigarettes. She has never used smokeless tobacco. She reports that she does not drink alcohol and does not use drugs.  Allergies  Allergen Reactions   Contrast Media [Iodinated Contrast Media] Other (See Comments)    Itching.( 13 hr pre medication regimen worked fine. )   Tdap [Tetanus-Diphth-Acell Pertussis] Hives   Cholestyramine     Other reaction(s): stomach felt too full.   Duloxetine Hcl     Other reaction(s): didn't sleep  but just took one pill   Etidronate     Other reaction(s): diarrhea   Ezetimibe-Simvastatin     Other reaction(s): Unknown   Hydromorphone     Other reaction(s): Unknown   Pravastatin     Other reaction(s): with daily dose just hurt more.   Streptococcus (Diplococcus) Pneumoniae [Streptococci] Other (See Comments)    Shortness of breath    Family History  Problem Relation Age of Onset   Heart attack Mother    Prostate cancer Father    Lung disease Brother    Heart attack Sister    COPD Sister    Cancer Sister        She does not know which type of cancer    Prior to Admission medications   Medication Sig Start Date End Date Taking? Authorizing Provider  acetaminophen (TYLENOL) 325 MG tablet Take 2 tablets (650 mg total) by mouth 3 (three) times daily. 07/29/19   Drema Dallas, MD  acyclovir  ointment (ZOVIRAX) 5 % Apply topically 5 (five) times daily. Patient not taking: Reported on 11/02/2021 07/04/21   Dorcas Carrow, MD  albuterol (PROVENTIL) (2.5 MG/3ML) 0.083% nebulizer solution Take 3 mLs (2.5 mg total) by nebulization every 2 (two) hours as needed for wheezing. 03/21/21   Ghimire, Werner Lean, MD  amLODipine (NORVASC) 5 MG tablet Take 5 mg by mouth daily.    [provider]  aspirin EC 81 MG tablet Take 1 tablet (81 mg total) by mouth daily. Swallow whole. 03/21/21   Ghimire, Werner Lean, MD  benazepril (LOTENSIN) 20 MG tablet Take 20 mg by mouth every morning.    [provider]  bisacodyl (DULCOLAX) 5 MG EC tablet Take 1 tablet (5 mg total) by mouth daily as needed for moderate constipation. 07/29/19   Drema Dallas, MD  cholecalciferol (VITAMIN D3) 25 MCG (1000 UNIT) tablet Take 2,000 Units by mouth daily.    [provider]  denosumab (PROLIA) 60 MG/ML SOSY injection Inject 60 mg into the skin every 6 (six) months.    [provider]  fentaNYL (DURAGESIC) 50 MCG/HR Place onto the skin every 3 (three) days.    [provider]  guaiFENesin (MUCINEX) 600 MG 12 hr tablet Take 1 tablet (600 mg total) by mouth 2 (two) times daily as needed for cough. 03/21/21   Ghimire, Werner Lean, MD  levothyroxine (SYNTHROID) 125 MCG tablet Take 125 mcg by mouth daily before breakfast.    [provider]  LORazepam (ATIVAN) 0.5 MG tablet Take 0.5 mg by mouth 2 (two) times daily.    [provider]  methocarbamol (ROBAXIN) 500 MG tablet Take 500 mg by mouth 2 (two) times daily as needed. 10/31/21   [provider]  methotrexate (RHEUMATREX) 2.5 MG tablet Take 15 mg by mouth every Sunday. 06/06/21   [provider]  omeprazole (PRILOSEC) 20 MG capsule Take 20 mg by mouth daily as needed (heartburn). 04/14/21   [provider]  oxyCODONE-acetaminophen (PERCOCET) 10-325 MG tablet Take 1 tablet by mouth every 6 (six) hours  as needed for pain. 07/04/21   Dorcas Carrow, MD  polyethylene glycol (MIRALAX / GLYCOLAX) 17 g packet Take 17 g by mouth daily as needed for mild constipation. 03/21/21   Ghimire, Werner Lean, MD  potassium chloride (KLOR-CON) 10 MEQ tablet Take 20 mEq by mouth daily. 10/17/21   [provider]  predniSONE (DELTASONE) 5 MG tablet Take 1 tablet (5 mg total) by mouth daily. 07/05/21   Dorcas Carrow, MD  sertraline (ZOLOFT) 50 MG tablet Take 50 mg by mouth daily. 06/21/21   [provider]  vitamin B-12 (CYANOCOBALAMIN) 1000 MCG tablet Take 1,000 mcg by mouth daily.    [provider]    Physical Exam: Vitals:   05/26/22 0346 05/26/22 0515 05/26/22 0738 05/26/22 0745  BP: (!) 147/84 (!) 112/99  (!) 157/78  Pulse: 85 91  85  Resp: 18 18  20   Temp: 98.1 F (36.7 C)  (!) 97.3 F (36.3 C)   TempSrc: Oral  Oral   SpO2: 100% 98%  96%  Weight:      Height:       Physical Exam Vitals and nursing note reviewed.  Constitutional:      General: She is awake. She is not in acute distress.    Appearance: She is well-developed.  HENT:     Head: Normocephalic.     Nose: No rhinorrhea.     Mouth/Throat:     Mouth: Mucous membranes are dry.  Eyes:     General: No scleral icterus.    Pupils: Pupils are equal, round, and reactive to light.  Neck:     Vascular: No JVD.  Cardiovascular:     Rate and Rhythm: Normal rate and regular rhythm.     Heart sounds: S1 normal and S2 normal.  Pulmonary:     Effort: Pulmonary effort is normal.     Breath sounds: Normal breath sounds.  Abdominal:     Palpations: Abdomen is soft.     Tenderness: There is no abdominal tenderness. There is no right CVA tenderness, left CVA tenderness, guarding or rebound.  Musculoskeletal:     Right hand: Deformity present. Decreased range of motion.     Left hand: Deformity present. Decreased range of motion.     Cervical back: Neck supple.     Right lower leg: Tenderness present. No edema.  Left lower leg: No edema.  Skin:    General: Skin is warm and dry.     Findings: Erythema present.     Comments: Positive erythema, edema, calor and TTP of irately pretibial area.  Please see pictures below.  Neurological:     General: No focal deficit present.     Mental Status: She is alert and oriented to person, place, and time.  Psychiatric:        Mood and Affect: Mood normal.        Behavior: Behavior normal. Behavior is cooperative.        Data Reviewed:  Results are pending, will review when available.  Assessment and Plan: Principal Problem:   Cellulitis of right lower extremity MedSurg/inpatient. Analgesics as needed. Continue ceftriaxone 1 g IVPB daily. Follow-up CBC and chemistry in the morning.  Active Problems:   Hypertension Continue amlodipine 5 mg p.o. daily. Continue benazepril 20 mg p.o. daily.    Hypothyroidism Continue levothyroxine 125 mcg p.o. daily.    Hyperlipidemia Intolerant to statins.    GERD (gastroesophageal reflux disease) Continue omeprazole or formulary equivalent daily.    Anxiety Continue sertraline 50 mg p.o. daily. Continue lorazepam 0.5 mg p.o. twice daily.    Chronic neck and back pain Continue with Duragesic patch every 72 hours. Continue oxycodone/APAP 10-325 mg    RA (rheumatoid arthritis) (HCC) Currently off methotrexate. Analgesics as needed. Follow-up with rheumatology as an outpatient.    Normocytic anemia Monitor hematocrit and hemoglobin.    Advance Care Planning:   Code Status: DNR   Consults:   Family Communication: Her daughter was at bedside.  Severity of Illness: The appropriate patient status for this patient is INPATIENT. Inpatient status is judged to be reasonable and necessary in order to provide the required intensity of service to ensure the patient's safety. The patient's presenting symptoms, physical exam findings, and initial radiographic and laboratory data in the context of their  chronic comorbidities is felt to place them at high risk for further clinical deterioration. Furthermore, it is not anticipated that the patient will be medically stable for discharge from the hospital within 2 midnights of admission.   * I certify that at the point of admission it is my clinical judgment that the patient will require inpatient hospital care spanning beyond 2 midnights from the point of admission due to high intensity of service, high risk for further deterioration and high frequency of surveillance required.*  Author: Bobette Mo, MD 05/26/2022 7:59 AM  For on call review [email or web address].   This document was prepared using Dragon voice recognition software and may contain some unintended transcription errors.

## 2022-05-26 NOTE — TOC CM/SW Note (Signed)
  Transition of Care [facility]) Screening Note   Patient Details  Name: Jocelyn Bauer Date of Birth: 08/24/1928   Transition of Care [facility] CM/SW Contact:    Howell Rucks, RN Phone Number: 05/26/2022, 10:52 AM    Transition of Care Department [facility]) has reviewed patient and no TOC needs have been identified at this time. We will continue to monitor patient advancement through interdisciplinary progression rounds. If new patient transition needs arise, please place a TOC consult.

## 2022-05-26 NOTE — Progress Notes (Signed)
Provider messaged in regard to no purewick order, no response received. Patient has cellulitis to right lower extremity. It is difficult for her to turn side to side in bed and painful. States her leg hurts and is unable to walk at this time. She arrived to floor with purewick present. Discussed with charge, RN and purewick continued.

## 2022-05-26 NOTE — ED Triage Notes (Signed)
Pt arrived from home via [facility] c/o abd pain 7/10 LRQ that hurts down in to the groin area and down the right leg. RLE noted to be bright red, +3 pitting edema, and weeping at present. Pt in self ambulatory at baseline and is unable to stand by self today.

## 2022-05-26 NOTE — ED Notes (Signed)
Attempted to walk patient, pt. Was not able to bear weight on legs to complete task.

## 2022-05-26 NOTE — ED Provider Notes (Signed)
[place] EMERGENCY DEPARTMENT AT [facility] Provider Note   CSN: [id number] Arrival date & time: 05/26/22  0008     History  Chief Complaint  Patient presents with   Abdominal Pain    Jocelyn Bauer is a 89 y.o. female.  The history is provided by the patient, the EMS personnel and medical records.  Abdominal Pain Jocelyn Bauer is a 89 y.o. female who presents to the Emergency Department complaining of leg and abdominal pain.  History is provided by EMS, the patient and the daughter at the bedside.  She presents to the emergency department by EMS for evaluation of leg swelling and pain as well as groin pain and back pain.  Daughter reports that she has severe weakness for the last several days and is now barely able to get out of bed.  She does live with the daughter.  Daughter also reports that there has been increased redness to the right leg.  No associated fever, chest pain, nausea, vomiting.  She is on 2 L nasal cannula oxygen at baseline.  She typically can ambulate with a walker or cane.  She does have a history of rheumatoid arthritis and takes prednisone daily.      Home Medications Prior to Admission medications   Medication Sig Start Date End Date Taking? Authorizing Provider  acetaminophen (TYLENOL) 325 MG tablet Take 2 tablets (650 mg total) by mouth 3 (three) times daily. 07/29/19   Drema Dallas, MD  acyclovir ointment (ZOVIRAX) 5 % Apply topically 5 (five) times daily. Patient not taking: Reported on 11/02/2021 07/04/21   Dorcas Carrow, MD  albuterol (PROVENTIL) (2.5 MG/3ML) 0.083% nebulizer solution Take 3 mLs (2.5 mg total) by nebulization every 2 (two) hours as needed for wheezing. 03/21/21   Ghimire, Werner Lean, MD  amLODipine (NORVASC) 5 MG tablet Take 5 mg by mouth daily.    [provider]  aspirin EC 81 MG tablet Take 1 tablet (81 mg total) by mouth daily. Swallow whole. 03/21/21   Ghimire, Werner Lean, MD  benazepril (LOTENSIN) 20 MG  tablet Take 20 mg by mouth every morning.    [provider]  bisacodyl (DULCOLAX) 5 MG EC tablet Take 1 tablet (5 mg total) by mouth daily as needed for moderate constipation. 07/29/19   Drema Dallas, MD  cholecalciferol (VITAMIN D3) 25 MCG (1000 UNIT) tablet Take 2,000 Units by mouth daily.    [provider]  denosumab (PROLIA) 60 MG/ML SOSY injection Inject 60 mg into the skin every 6 (six) months.    [provider]  fentaNYL (DURAGESIC) 50 MCG/HR Place onto the skin every 3 (three) days.    [provider]  guaiFENesin (MUCINEX) 600 MG 12 hr tablet Take 1 tablet (600 mg total) by mouth 2 (two) times daily as needed for cough. 03/21/21   Ghimire, Werner Lean, MD  levothyroxine (SYNTHROID) 125 MCG tablet Take 125 mcg by mouth daily before breakfast.    [provider]  LORazepam (ATIVAN) 0.5 MG tablet Take 0.5 mg by mouth 2 (two) times daily.    [provider]  methocarbamol (ROBAXIN) 500 MG tablet Take 500 mg by mouth 2 (two) times daily as needed. 10/31/21   [provider]  methotrexate (RHEUMATREX) 2.5 MG tablet Take 15 mg by mouth every Sunday. 06/06/21   [provider]  omeprazole (PRILOSEC) 20 MG capsule Take 20 mg by mouth daily as needed (heartburn). 04/14/21   [provider]  oxyCODONE-acetaminophen (PERCOCET) 10-325 MG tablet Take 1 tablet by mouth every 6 (six) hours as needed for pain. 07/04/21   Dorcas Carrow, MD  polyethylene glycol (MIRALAX / GLYCOLAX) 17 g packet Take 17 g by mouth daily as needed for mild constipation. 03/21/21   Ghimire, Werner Lean, MD  potassium chloride (KLOR-CON) 10 MEQ tablet Take 20 mEq by mouth daily. 10/17/21   [provider]  predniSONE (DELTASONE) 5 MG tablet Take 1 tablet (5 mg total) by mouth daily. 07/05/21   Dorcas Carrow, MD  sertraline (ZOLOFT) 50 MG tablet Take 50 mg by mouth daily. 06/21/21   [provider]  vitamin B-12 (CYANOCOBALAMIN) 1000 MCG  tablet Take 1,000 mcg by mouth daily.    [provider]      Allergies    Contrast media [iodinated contrast media], Tdap [tetanus-diphth-acell pertussis], Cholestyramine, Duloxetine hcl, Etidronate, Ezetimibe-simvastatin, Hydromorphone, Pravastatin, and Streptococcus (diplococcus) pneumoniae [streptococci]    Review of Systems   Review of Systems  Gastrointestinal:  Positive for abdominal pain.  All other systems reviewed and are negative.   Physical Exam Updated Vital Signs BP (!) 147/84 (BP Location: Left Arm)   Pulse 85   Temp 98.1 F (36.7 C) (Oral)   Resp 18   Ht 5\' 2"  (1.575 m)   Wt 60.8 kg   SpO2 100%   BMI 24.51 kg/m  Physical Exam Vitals and nursing note reviewed.  Constitutional:      Appearance: She is well-developed.  HENT:     Head: Normocephalic and atraumatic.  Cardiovascular:     Rate and Rhythm: Normal rate and regular rhythm.     Heart sounds: No murmur heard. Pulmonary:     Effort: Pulmonary effort is normal. No respiratory distress.     Breath sounds: Normal breath sounds.  Abdominal:     Palpations: Abdomen is soft.     Tenderness: There is no guarding or rebound.     Comments: Moderate generalized abdominal tenderness  Musculoskeletal:     Comments: 2+ DP pulses bilaterally.  1-2+ edema to BLE with erythema to the right distal leg and foot.   Skin:    General: Skin is warm and dry.  Neurological:     Mental Status: She is alert.     Comments: Oriented to person and place, disoriented to time and recent events.   Psychiatric:        Behavior: Behavior normal.     ED Results / Procedures / Treatments   Labs (all labs ordered are listed, but only abnormal results are displayed) Labs Reviewed  COMPREHENSIVE METABOLIC PANEL - Abnormal; Notable for the following components:      Result Value   Glucose, Bld 121 (*)    Calcium 8.5 (*)    AST 43 (*)    All other components within normal limits  CBC WITH DIFFERENTIAL/PLATELET -  Abnormal; Notable for the following components:   RBC 3.17 (*)    Hemoglobin 9.3 (*)    HCT 30.9 (*)    RDW 22.2 (*)    Platelets 412 (*)    All other components within normal limits  LACTIC ACID, PLASMA  BRAIN NATRIURETIC PEPTIDE  LIPASE, BLOOD  LACTIC ACID, PLASMA  URINALYSIS, ROUTINE W REFLEX MICROSCOPIC  TROPONIN I (HIGH SENSITIVITY)  TROPONIN I (HIGH SENSITIVITY)    EKG None  Radiology DG Chest Port 1 View  Result Date: 05/26/2022 CLINICAL DATA:  Weakness, peripheral edema EXAM: PORTABLE CHEST 1 VIEW COMPARISON:  11/02/2021 FINDINGS: Minimal  left basilar parenchymal scarring. Lungs are otherwise clear. No pneumothorax or pleural effusion. Cardiac size within normal limits. Pulmonary vascularity is normal. No acute bone abnormality. IMPRESSION: 1. No active disease. Electronically Signed   By: Helyn Numbers M.D.   On: 05/26/2022 02:33   CT ABDOMEN PELVIS WO CONTRAST  Result Date: 05/26/2022 CLINICAL DATA:  Abdominal pain. EXAM: CT ABDOMEN AND PELVIS WITHOUT CONTRAST TECHNIQUE: Multidetector CT imaging of the abdomen and pelvis was performed following the standard protocol without IV contrast. RADIATION DOSE REDUCTION: This exam was performed according to the departmental dose-optimization program which includes automated exposure control, adjustment of the mA and/or kV according to patient size and/or use of iterative reconstruction technique. COMPARISON:  CT abdomen pelvis dated 11/02/2021. FINDINGS: Evaluation of this exam is limited in the absence of intravenous contrast. Evaluation is also limited due to respiratory motion and streak artifact caused by patient's arms. Lower chest: A 5 mm left lung base nodule, present on the prior CT. There is coronary vascular calcification. No intra-abdominal free air or free fluid. Hepatobiliary: The liver is unremarkable. No biliary ductal dilatation. The gallbladder is unremarkable. Pancreas: Unremarkable. No pancreatic ductal dilatation or  surrounding inflammatory changes. Spleen: Normal in size without focal abnormality. Adrenals/Urinary Tract: The adrenal glands are unremarkable. Small nonobstructing bilateral renal calculi. There is no hydronephrosis or obstructing stone. The visualized ureters and urinary bladder appear unremarkable. Stomach/Bowel: There is no bowel obstruction or active inflammation. Small hiatal hernia. The appendix is not visualized with certainty. No inflammatory changes identified in the right lower quadrant. Vascular/Lymphatic: Moderate aortoiliac atherosclerotic disease. The IVC is unremarkable. No portal venous gas. There is no adenopathy. Reproductive: The uterus is grossly unremarkable. No adnexal masses. Other: None Musculoskeletal: Osteopenia with degenerative changes of the spine. Total left hip arthroplasty. Compression fracture of inferior endplate of L4, progressed since the prior CT, age indeterminate. Correlation with clinical exam and point tenderness recommended. IMPRESSION: 1. No bowel obstruction. 2. Small nonobstructing bilateral renal calculi. No hydronephrosis or obstructing stone. 3. Compression fracture of inferior endplate of L4, progressed since the prior CT, age indeterminate. Correlation with clinical exam and point tenderness recommended. 4. A 5 mm left lung base nodule, present on the prior CT. 5.  Aortic Atherosclerosis (ICD10-I70.0). Electronically Signed   By: Elgie Collard M.D.   On: 05/26/2022 02:30    Procedures Procedures    Medications Ordered in ED Medications  ceFAZolin (ANCEF) IVPB 1 g/50 mL premix (1 g Intravenous New Bag/Given 05/26/22 0236)    ED Course/ Medical Decision Making/ A&P                             Medical Decision Making Amount and/or Complexity of Data Reviewed Labs: ordered. Radiology: ordered.  Risk Prescription drug management. Decision regarding hospitalization.  Patient with history rheumatoid arthritis on chronic prednisone therapy,  chronic oxygen dependence here for evaluation of abdominal pain, lower extremity edema and leg pain.  She is chronically ill-appearing on examination.  She does have edema to bilateral extremities but right lower extremity is concerning for cellulitis with significant erythema to the calf and foot.  She is very tender in this area.  Current clinical picture is not consistent with necrotizing soft tissue infection.  Patient is unable to sit up or stand without assistance.  Labs are stable when compared to priors.  Given her abdominal tenderness on examination a CT abdomen pelvis was obtained.  CT does demonstrate progression  of the known L4 compression fracture but otherwise no significant findings.  Given patient's degree of cellulitis, relative immunosuppression with her chronic prednisone use as well as inability to stand or ambulate medicine consulted for observation admission for ongoing care.         Final Clinical Impression(s) / ED Diagnoses Final diagnoses:  Cellulitis of right lower extremity  Lower abdominal pain    Rx / DC Orders ED Discharge Orders     None         Tilden Fossa, MD 05/26/22 [phone]

## 2022-05-27 ENCOUNTER — Inpatient Hospital Stay (HOSPITAL_COMMUNITY): Payer: Medicare Other

## 2022-05-27 DIAGNOSIS — R609 Edema, unspecified: Secondary | ICD-10-CM

## 2022-05-27 DIAGNOSIS — L03115 Cellulitis of right lower limb: Secondary | ICD-10-CM | POA: Diagnosis not present

## 2022-05-27 LAB — COMPREHENSIVE METABOLIC PANEL
ALT: 18 U/L (ref 0–44)
AST: 35 U/L (ref 15–41)
Albumin: 3.2 g/dL — ABNORMAL LOW (ref 3.5–5.0)
Alkaline Phosphatase: 35 U/L — ABNORMAL LOW (ref 38–126)
Anion gap: 7 (ref 5–15)
BUN: 12 mg/dL (ref 8–23)
CO2: 27 mmol/L (ref 22–32)
Calcium: 8.3 mg/dL — ABNORMAL LOW (ref 8.9–10.3)
Chloride: 105 mmol/L (ref 98–111)
Creatinine, Ser: 0.75 mg/dL (ref 0.44–1.00)
GFR, Estimated: >60 mL/min (ref >60)
Glucose, Bld: 101 mg/dL — ABNORMAL HIGH (ref 70–99)
Potassium: 3.1 mmol/L — ABNORMAL LOW (ref 3.5–5.1)
Sodium: 139 mmol/L (ref 135–145)
Total Bilirubin: 0.3 mg/dL (ref 0.3–1.2)
Total Protein: 6 g/dL — ABNORMAL LOW (ref 6.5–8.1)

## 2022-05-27 LAB — CBC
HCT: 29.8 % — ABNORMAL LOW (ref 36.0–46.0)
Hemoglobin: 9.2 g/dL — ABNORMAL LOW (ref 12.0–15.0)
MCH: 30.4 pg (ref 26.0–34.0)
MCHC: 30.9 g/dL (ref 30.0–36.0)
MCV: 98.3 fL (ref 80.0–100.0)
Platelets: 339 10*3/uL (ref 150–400)
RBC: 3.03 MIL/uL — ABNORMAL LOW (ref 3.87–5.11)
RDW: 22.4 % — ABNORMAL HIGH (ref 11.5–15.5)
WBC: 5.8 10*3/uL (ref 4.0–10.5)
nRBC: 0 % (ref 0.0–0.2)

## 2022-05-27 LAB — MAGNESIUM: Magnesium: 2.1 mg/dL (ref 1.7–2.4)

## 2022-05-27 MED ORDER — POLYETHYLENE GLYCOL 3350 17 G PO PACK
17.0000 g | PACK | Freq: Every day | ORAL | Status: DC
  Administered 2022-05-27 – 2022-06-04 (×9): 17 g via ORAL
  Filled 2022-05-27 (×9): qty 1

## 2022-05-27 MED ORDER — SODIUM CHLORIDE 0.9 % IV SOLN
2.0000 g | INTRAVENOUS | Status: DC
  Administered 2022-05-28: 2 g via INTRAVENOUS
  Filled 2022-05-27: qty 20

## 2022-05-27 MED ORDER — SENNOSIDES-DOCUSATE SODIUM 8.6-50 MG PO TABS
1.0000 | ORAL_TABLET | Freq: Two times a day (BID) | ORAL | Status: DC
  Administered 2022-05-27 – 2022-06-04 (×16): 1 via ORAL
  Filled 2022-05-27 (×17): qty 1

## 2022-05-27 MED ORDER — SODIUM CHLORIDE 0.9 % IV SOLN
1.0000 g | INTRAVENOUS | Status: AC
  Administered 2022-05-27: 1 g via INTRAVENOUS
  Filled 2022-05-27: qty 10

## 2022-05-27 MED ORDER — POTASSIUM CHLORIDE CRYS ER 20 MEQ PO TBCR
40.0000 meq | EXTENDED_RELEASE_TABLET | Freq: Every day | ORAL | Status: AC
  Administered 2022-05-27: 40 meq via ORAL
  Filled 2022-05-27: qty 2

## 2022-05-27 MED ORDER — SODIUM CHLORIDE 0.9 % IV SOLN: 2.0000 g | INTRAVENOUS | Status: DC

## 2022-05-27 NOTE — Progress Notes (Addendum)
PROGRESS NOTE  Jocelyn Bauer  ZOX:[id number] DOB: 01/22/1928 DOA: 05/26/2022 PCP: Rodrigo Ran, MD   Brief Narrative: Patient is a 89 year old female with history of anxiety, osteoarthritis, osteoporosis, rheumatoid arthritis, bilateral ankle fractures, hyperlipidemia, hypertension, hypothyroidism who presented with complaint of abdominal pain, right lower extremity edema, erythema.  On presentation she was afebrile, normotensive.  Labs showed WBC count of 7.8.  Chest x-ray did not show any acute disease.  CT of abdomen/pelvis did not show any acute findings but showed small nonobstructing bilateral calculi, compression fraction of inferior endplate of L4.  Patient was admitted for the management of cellulitis of right lower extremity.  Assessment & Plan:  Principal Problem:   Cellulitis of right lower extremity Active Problems:   Hypertension   Hypothyroidism   Hyperlipidemia   GERD (gastroesophageal reflux disease)   Anxiety   Chronic neck and back pain   RA (rheumatoid arthritis) (HCC)   Normocytic anemia  Right lower extremity cellulitis: Presented with right lower extremity pain, swelling.  Started on ceftriaxone.  Follow-up cultures.  Currently she is afebrile. Will get Doppler of the right lower extremity due to edema to rule out DVT  Abdominal pain: Unclear etiology.  CT abdomen /pelvis did not show any acute findings.  Abdomen is benign on examination  Hypokalemia: Being supplemented with potassium  Hypertension: On amlodipine, benazepril.  Monitor blood pressure  Hypothyroidism: Continue Synthyroid  Hyperlipidemia: Intolerant to statin  GERD: Continue PPI  Anxiety: Continue sertraline, lorazepam  Chronic neck/back pain: On Duragesic patch.  On oxycodone.  CT imaging showed compression fraction of inferior endplate of L4.  Continue supportive care, pain management  Rheumatoid  arthritis: On methotrexate.  Follows with rheumatology  Chronic hypoxic Respiratory  failure: On 2 L of oxygen at home chronically.  She has few crackles bilaterally, likely associated with interstitial  lung disease with her history of rheumatoid arthritis  Lung nodule: A 5 mm left lung base nodule, present on the prior CT. not sure that a follow-up on this will be helpful due to her age  Normocytic anemia: Currently hemoglobin stable  Deconditioning/debility: Patient lives with her daughter and ambulates with the help of walker.  PT recommended home health on discharge but daughter is requesting SNF.  TOC made aware.         DVT prophylaxis:SCDs Start: 05/26/22 8119     Code Status: DNR  Family Communication: Called daughters [name] on phone and discussed about her medical management plan  Patient status:Inpatient  Patient is from :Home  Anticipated discharge JY:[facility] vs [facility]  Estimated DC date:1-2 days   Consultants: None  Procedures:None  Antimicrobials:  Anti-infectives (From admission, onward)    Start     Dose/Rate Route Frequency Ordered Stop   05/26/22 1000  cefTRIAXone (ROCEPHIN) 1 g in sodium chloride 0.9 % 100 mL IVPB        1 g 200 mL/hr over 30 Minutes Intravenous Every 24 hours 05/26/22 0853 06/02/22 0959   05/26/22 0200  ceFAZolin (ANCEF) IVPB 1 g/50 mL premix        1 g 100 mL/hr over 30 Minutes Intravenous  Once 05/26/22 0157 05/26/22 5621       Subjective: Patient seen and examined at bedside today.  Hemodynamically stable lying in bed.  Appears deconditioned but most likely this is from her age.  Alert, awake and mostly oriented.  Denies any abdomen pain today.  Objective: Vitals:   05/26/22 1350 05/26/22 1804 05/26/22 2130 05/27/22 0453  BP: Marland Kitchen)  131/49 (!) 150/58 (!) 137/53 (!) 147/65  Pulse: 87 87 79 92  Resp:   18 18  Temp: 98.1 F (36.7 C) (!) 97.3 F (36.3 C) 98.3 F (36.8 C) 98.2 F (36.8 C)  TempSrc: Oral Oral Oral Oral  SpO2: 100% 100% 100% 100%  Weight:      Height:        Intake/Output Summary (Last 24  hours) at 05/27/2022 0751 Last data filed at 05/26/2022 1525 Gross per 24 hour  Intake 220 ml  Output --  Net 220 ml   Filed Weights   05/26/22 0017  Weight: 60.8 kg    Examination:  General exam: Overall comfortable, not in distress, pleasant elderly female, weak HEENT: PERRL Respiratory system: Few crackles at laterally Cardiovascular system: S1 & S2 heard, RRR.  Gastrointestinal system: Abdomen is mildly distended, soft and nontender.  Bowel sounds present Central nervous system: Alert and oriented Extremities: Edema of the right  lower extremity, no clubbing ,no cyanosis Skin: No rashes, no ulcers,no icterus     Data Reviewed: I have personally reviewed following labs and imaging studies  CBC: Recent Labs  Lab 05/26/22 0020 05/27/22 0538  WBC 7.8 5.8  NEUTROABS 6.7  --   HGB 9.3* 9.2*  HCT 30.9* 29.8*  MCV 97.5 98.3  PLT 412* 339   Basic Metabolic Panel: Recent Labs  Lab 05/26/22 0020 05/27/22 0538  NA 136 139  K 3.5 3.1*  CL 102 105  CO2 25 27  GLUCOSE 121* 101*  BUN 21 12  CREATININE 0.85 0.75  CALCIUM 8.5* 8.3*     No results found for this or any previous visit (from the past 240 hour(s)).   Radiology Studies: DG Chest Port 1 View  Result Date: 05/26/2022 CLINICAL DATA:  Weakness, peripheral edema EXAM: PORTABLE CHEST 1 VIEW COMPARISON:  11/02/2021 FINDINGS: Minimal left basilar parenchymal scarring. Lungs are otherwise clear. No pneumothorax or pleural effusion. Cardiac size within normal limits. Pulmonary vascularity is normal. No acute bone abnormality. IMPRESSION: 1. No active disease. Electronically Signed   By: Helyn Numbers M.D.   On: 05/26/2022 02:33   CT ABDOMEN PELVIS WO CONTRAST  Result Date: 05/26/2022 CLINICAL DATA:  Abdominal pain. EXAM: CT ABDOMEN AND PELVIS WITHOUT CONTRAST TECHNIQUE: Multidetector CT imaging of the abdomen and pelvis was performed following the standard protocol without IV contrast. RADIATION DOSE REDUCTION: This  exam was performed according to the departmental dose-optimization program which includes automated exposure control, adjustment of the mA and/or kV according to patient size and/or use of iterative reconstruction technique. COMPARISON:  CT abdomen pelvis dated 11/02/2021. FINDINGS: Evaluation of this exam is limited in the absence of intravenous contrast. Evaluation is also limited due to respiratory motion and streak artifact caused by patient's arms. Lower chest: A 5 mm left lung base nodule, present on the prior CT. There is coronary vascular calcification. No intra-abdominal free air or free fluid. Hepatobiliary: The liver is unremarkable. No biliary ductal dilatation. The gallbladder is unremarkable. Pancreas: Unremarkable. No pancreatic ductal dilatation or surrounding inflammatory changes. Spleen: Normal in size without focal abnormality. Adrenals/Urinary Tract: The adrenal glands are unremarkable. Small nonobstructing bilateral renal calculi. There is no hydronephrosis or obstructing stone. The visualized ureters and urinary bladder appear unremarkable. Stomach/Bowel: There is no bowel obstruction or active inflammation. Small hiatal hernia. The appendix is not visualized with certainty. No inflammatory changes identified in the right lower quadrant. Vascular/Lymphatic: Moderate aortoiliac atherosclerotic disease. The IVC is unremarkable. No portal venous gas. There  is no adenopathy. Reproductive: The uterus is grossly unremarkable. No adnexal masses. Other: None Musculoskeletal: Osteopenia with degenerative changes of the spine. Total left hip arthroplasty. Compression fracture of inferior endplate of L4, progressed since the prior CT, age indeterminate. Correlation with clinical exam and point tenderness recommended. IMPRESSION: 1. No bowel obstruction. 2. Small nonobstructing bilateral renal calculi. No hydronephrosis or obstructing stone. 3. Compression fracture of inferior endplate of L4, progressed  since the prior CT, age indeterminate. Correlation with clinical exam and point tenderness recommended. 4. A 5 mm left lung base nodule, present on the prior CT. 5.  Aortic Atherosclerosis (ICD10-I70.0). Electronically Signed   By: Elgie Collard M.D.   On: 05/26/2022 02:30    Scheduled Meds:  amLODipine  5 mg Oral Daily   benazepril  20 mg Oral q morning   cholecalciferol  2,000 Units Oral Daily   cyanocobalamin  1,000 mcg Oral Daily   fentaNYL  1 patch Transdermal Q72H   levothyroxine  125 mcg Oral Q0600   LORazepam  0.5 mg Oral BID   pantoprazole  40 mg Oral Daily   potassium chloride SA  20 mEq Oral Daily   predniSONE  5 mg Oral Daily   sertraline  50 mg Oral Daily   Continuous Infusions:  cefTRIAXone (ROCEPHIN)  IV Stopped (05/26/22 1025)     LOS: 1 day   Burnadette Pop, MD [facility] P5/20/2024, 7:51 AM

## 2022-05-27 NOTE — Evaluation (Signed)
Occupational Therapy Evaluation Patient Details Name: Jocelyn Bauer MRN: [id number] DOB: Dec 11, 1928 Today's Date: 05/27/2022   History of Present Illness 89 yo female admitted to hospital on 05/26/2022 due to radiating RLQ abdominal pain, RLE edema, weeping, rubor and pain. Imaging completed including CXR and CT of abdomen/pelvis negative for acute findings, with noted known L4 compression fx. Abn lab findings. Pt was found to have pain associated with R LE cellulitis impacting pts safety and I with functional mobility tasks. Pt requires 2 L/min supplemental O2 at baseline. Pt has PMH including but not limited to: RA, B ankle fx s/p ORIF, chronic neck and LBP, HDL, HTN,  B total elbow replacement, and L THA .   Clinical Impression   Pt admitted with the above diagnosis and has the deficits outlined below. Pt would benefit from cont OT to increase independence with basic adls and adl transfers so she can d/c home with her daughter.  At baseline, pt can toilet, walk short distances with walker and assist with basic adls.  Pt lives with daughter who works. If daughter at work, daughter in law can assist but is unable to help with transfers and adls. Pt is alone at home at times but never longer than an hour.  Feel pt will need post acute rehab of 3 hours or less a day prior to returning home as pt is alone at times and daughter in law is unable to provide the physical assist needed due to a neck injury.  Will continue to follow with focus on adls and fall prevention.      Recommendations for follow up therapy are one component of a multi-disciplinary discharge planning process, led by the attending physician.  Recommendations may be updated based on patient status, additional functional criteria and insurance authorization.   Assistance Recommended at Discharge Frequent or constant Supervision/Assistance  Patient can return home with the following A little help with walking and/or transfers;A lot of help  with bathing/dressing/bathroom;Assistance with cooking/housework;Assist for transportation;Help with stairs or ramp for entrance    Functional Status Assessment  Patient has had a recent decline in their functional status and demonstrates the ability to make significant improvements in function in a reasonable and predictable amount of time.  Equipment Recommendations  None recommended by OT    Recommendations for Other Services       Precautions / Restrictions Precautions Precautions: Fall Restrictions Weight Bearing Restrictions: No      Mobility Bed Mobility               General bed mobility comments: Pt in chair on arrival.    Transfers Overall transfer level: Needs assistance Equipment used: Rolling walker (2 wheels) Transfers: Sit to/from Stand, Bed to chair/wheelchair/BSC Sit to Stand: Mod assist Stand pivot transfers: Min assist   Step pivot transfers: Min assist     General transfer comment: Pt required assist to get into standing but once up only require min assist on way back from commode due to fatigue.      Balance Overall balance assessment: History of Falls, Needs assistance Sitting-balance support: Feet supported Sitting balance-Leahy Scale: Fair     Standing balance support: Bilateral upper extremity supported, During functional activity, Reliant on assistive device for balance Standing balance-Leahy Scale: Poor Standing balance comment: must have outside support to stand.                           ADL either performed or  assessed with clinical judgement   ADL Overall ADL's : Needs assistance/impaired Eating/Feeding: Set up;Sitting   Grooming: Wash/dry hands;Wash/dry face;Oral care;Set up;Sitting Grooming Details (indicate cue type and reason): pt struggles opening small containers but can manage ones she has at home and has daugther or caregiver to assist as needed. Upper Body Bathing: Minimal assistance   Lower Body  Bathing: Maximal assistance;Sit to/from stand;Cueing for compensatory techniques Lower Body Bathing Details (indicate cue type and reason): Pt having a hard time being able to stand and let go of walker to be able to clean self. Pt must have both hands on walker at this time to be able to sustain standing. Upper Body Dressing : Minimal assistance;Sitting   Lower Body Dressing: Maximal assistance;Sit to/from stand;Cueing for compensatory techniques Lower Body Dressing Details (indicate cue type and reason): Pt usually dresses self outside of shoes. Pt unable to stand and pull up own pants at this time. Toilet Transfer: Moderate assistance;Stand-pivot;Rolling walker (2 wheels);BSC/3in1 Toilet Transfer Details (indicate cue type and reason): pivot with walker Toileting- Clothing Manipulation and Hygiene: Maximal assistance;Sit to/from stand;Cueing for compensatory techniques Toileting - Clothing Manipulation Details (indicate cue type and reason): Pt cleaned self in sitting but was unable to do complete job. Attempted in standing but required max assist to clean self and keep balance.   Tub/Shower Transfer Details (indicate cue type and reason): Pt stated she no longer gets in shower for fear of falling and just sponge bathes. Functional mobility during ADLs: Moderate assistance;Rolling walker (2 wheels) General ADL Comments: Pt most limited by fatigue and poor balance in standing.     Vision Baseline Vision/History: 1 Wears glasses Ability to See in Adequate Light: 0 Adequate Patient Visual Report: No change from baseline Vision Assessment?: No apparent visual deficits     Perception     Praxis      Pertinent Vitals/Pain Pain Assessment Faces Pain Scale: Hurts even more Pain Location: R distal LE Pain Descriptors / Indicators: Constant, Grimacing, Guarding Pain Intervention(s): Limited activity within patient's tolerance, Monitored during session, Repositioned     Hand Dominance  Right   Extremity/Trunk Assessment Upper Extremity Assessment Upper Extremity Assessment: Generalized weakness (Pt with RA and B elbow replacements.  L affected worse than r but pt is at baseline with BUE movement.)   Lower Extremity Assessment Lower Extremity Assessment: Defer to PT evaluation   Cervical / Trunk Assessment Cervical / Trunk Assessment: Kyphotic   Communication Communication Communication: HOH   Cognition Arousal/Alertness: Awake/alert Behavior During Therapy: WFL for tasks assessed/performed Overall Cognitive Status: Within Functional Limits for tasks assessed                                       General Comments  Pt must have someone with her when standing or walking.    Exercises     Shoulder Instructions      Home Living Family/patient expects to be discharged to:: Private residence Living Arrangements: Children Available Help at Discharge: Family;Available PRN/intermittently (Daughter in law helps some but not available 23/7.) Type of Home: House Home Access: Ramped entrance     Home Layout: One level     Bathroom Shower/Tub: Chief Strategy Officer: Standard Bathroom Accessibility: Yes How Accessible: Accessible via walker Home Equipment: Rolling Walker (2 wheels);Cane - single point;Wheelchair - Dentist (4 wheels)          Prior Functioning/Environment  Prior Level of Function : Needs assist       Physical Assist : Mobility (physical);ADLs (physical) Mobility (physical): Stairs ADLs (physical): Bathing;Dressing;IADLs Mobility Comments: pt reports she is able to amb t/o home with rollator, amb to bathroom 2 x day and remainder uses BSC, pt has a Multimedia programmer", pt indicated she is able to dress herself. ADLs Comments: daughter assists with IADLs, household and medication management, caregiver assists with meal prep        OT Problem List: Decreased strength;Decreased range of  motion;Decreased activity tolerance;Impaired balance (sitting and/or standing);Decreased knowledge of use of DME or AE;Impaired UE functional use;Pain      OT Treatment/Interventions: Self-care/ADL training;Therapeutic activities;DME and/or AE instruction;Balance training    OT Goals(Current goals can be found in the care plan section) Acute Rehab OT Goals Patient Stated Goal: to get stronger. OT Goal Formulation: With patient Time For Goal Achievement: 06/10/22 Potential to Achieve Goals: Good ADL Goals Pt Will Perform Lower Body Bathing: with supervision;sit to/from stand Pt Will Perform Lower Body Dressing: with min assist;sit to/from stand Pt Will Transfer to Toilet: with supervision;bedside commode;ambulating Pt Will Perform Toileting - Clothing Manipulation and hygiene: with supervision;sit to/from stand Additional ADL Goal #1: Pt will walk to bathroom w walker and toilet with min guard.  OT Frequency: Min 1X/week    Co-evaluation              AM-PAC OT "6 Clicks" Daily Activity     Outcome Measure Help from another person eating meals?: A Little Help from another person taking care of personal grooming?: None Help from another person toileting, which includes using toliet, bedpan, or urinal?: A Lot Help from another person bathing (including washing, rinsing, drying)?: A Lot Help from another person to put on and taking off regular upper body clothing?: A Little Help from another person to put on and taking off regular lower body clothing?: A Lot 6 Click Score: 16   End of Session Equipment Utilized During Treatment: Gait belt;Rolling walker (2 wheels);Oxygen Nurse Communication: Mobility status  Activity Tolerance: Patient limited by fatigue Patient left: in chair;with call bell/phone within reach  OT Visit Diagnosis: Unsteadiness on feet (R26.81);Pain Pain - Right/Left: Right Pain - part of body: Leg                Time: 1030-1100 OT Time Calculation (min): 30  min Charges:  OT General Charges $OT Visit: 1 Visit OT Evaluation $OT Eval Moderate Complexity: 1 Mod OT Treatments $Self Care/Home Management : 8-22 mins  Hope Budds 05/27/2022, 11:27 AM

## 2022-05-27 NOTE — Progress Notes (Signed)
RLE venous duplex has been completed.    Results can be found under chart review under CV PROC. 05/27/2022 3:01 PM Brocha Gilliam RVT, RDMS

## 2022-05-27 NOTE — Evaluation (Signed)
Physical Therapy Evaluation Patient Details Name: Jocelyn Bauer MRN: [id number] DOB: 1928-09-23 Today's Date: 05/27/2022  History of Present Illness  89 yo female admitted to hospital on 05/26/2022 due to radiating RLQ abdominal pain, RLE edema, weeping, rubor and pain. Imaging completed including CXR and CT of abdomen/pelvis negative for acute findings, with noted known L4 compression fx. Abn lab findings. Pt was found to have pain associated with R LE cellulitis impacting pts safety and I with functional mobility tasks. Pt requires 2 L/min supplemental O2 at baseline. Pt has PMH including but not limited to: RA, B ankle fx s/p ORIF, chronic neck and LBP, HDL, HTN,  B total elbow replacement, and L THA .  Clinical Impression    Pt admitted with above diagnosis.  Pt currently with functional limitations due to the deficits listed below (see PT Problem List). Pt presented in bed when PT arrived. Pt agreeable to therapy eval. Pt reports no pain at rest, pain behviors noted when PT assisting to don R sock and when in R LE WB. Pt required min A with HOB elevated and use of bed rail with increased time for supine to sit, pt required mod A for STS from EOB and from recliner, cues for proper UE and AD placement, B UE ROM and MMT limited due to RA and hx of B elbow replacements increasing difficulty with eccentric control to sitting surface. Pt unable to safely ambulate at time of eval. Pt exhibits a functional decline from baseline.  Pt Pt will benefit from acute skilled PT in current and next venue to increase their independence and safety with mobility to allow discharge.        Recommendations for follow up therapy are one component of a multi-disciplinary discharge planning process, led by the attending physician.  Recommendations may be updated based on patient status, additional functional criteria and insurance authorization.  Follow Up Recommendations       Assistance Recommended at Discharge  Frequent or constant Supervision/Assistance  Patient can return home with the following  A lot of help with walking and/or transfers;A lot of help with bathing/dressing/bathroom;Assistance with cooking/housework;Direct supervision/assist for medications management;Direct supervision/assist for financial management;Assist for transportation;Help with stairs or ramp for entrance    Equipment Recommendations None recommended by PT (pt reports DME in home setting)  Recommendations for Other Services       Functional Status Assessment Patient has had a recent decline in their functional status and demonstrates the ability to make significant improvements in function in a reasonable and predictable amount of time.     Precautions / Restrictions Precautions Precautions: Fall Restrictions Weight Bearing Restrictions: No (Simultaneous filing. User may not have seen previous data.)      Mobility  Bed Mobility Overal bed mobility: Needs Assistance Bed Mobility: Supine to Sit     Supine to sit: Min assist, HOB elevated     General bed mobility comments: increased time, cues and use of bed rail    Transfers Overall transfer level: Needs assistance Equipment used: Rolling walker (2 wheels) Transfers: Sit to/from Stand, Bed to chair/wheelchair/BSC Sit to Stand: Mod assist Stand pivot transfers: Min guard         General transfer comment: pt required mod A for STS from EOB and from recliner with cues, once in standing pt required min guard to complete pivoting task    Ambulation/Gait               General Gait Details: NT  Stairs  Wheelchair Mobility    Modified Rankin (Stroke Patients Only)       Balance Overall balance assessment: History of Falls, Needs assistance Sitting-balance support: Feet supported Sitting balance-Leahy Scale: Fair     Standing balance support: Bilateral upper extremity supported, During functional activity, Reliant on  assistive device for balance Standing balance-Leahy Scale: Poor                               Pertinent Vitals/Pain Pain Assessment Pain Assessment: Faces (Simultaneous filing. User may not have seen previous data.) Faces Pain Scale: Hurts even more Pain Location: R distal LE Pain Descriptors / Indicators: Constant, Grimacing, Guarding Pain Intervention(s): Monitored during session, Limited activity within patient's tolerance    Home Living Family/patient expects to be discharged to:: Private residence (Simultaneous filing. User may not have seen previous data.) Living Arrangements: Children (Simultaneous filing. User may not have seen previous data.) Available Help at Discharge: Family;Available PRN/intermittently (pt reports caregiver when daughter is at work  Simultaneous filing. User may not have seen previous data.) Type of Home: House (Simultaneous filing. User may not have seen previous data.) Home Access: Ramped entrance (Simultaneous filing. User may not have seen previous data.)       Home Layout: One level (Simultaneous filing. User may not have seen previous data.) Home Equipment: Rolling Walker (2 wheels);Cane - single point;Wheelchair - Dentist (4 wheels) (Simultaneous filing. User may not have seen previous data.)      Prior Function Prior Level of Function : Needs assist (Simultaneous filing. User may not have seen previous data.)       Physical Assist : Mobility (physical);ADLs (physical) (Simultaneous filing. User may not have seen previous data.) Mobility (physical): Stairs ADLs (physical): Bathing;Dressing;IADLs (Simultaneous filing. User may not have seen previous data.) Mobility Comments: pt reports she is able to amb t/o home with rollator, amb to bathroom 2 x day and remainder uses BSC, pt has a Multimedia programmer", pt indicated she is able to dress herself. (Simultaneous filing. User may not have seen previous  data.) ADLs Comments: daughter assists with IADLs, household and medication management, caregiver assists with meal prep (Simultaneous filing. User may not have seen previous data.)     Hand Dominance   Dominant Hand: Right    Extremity/Trunk Assessment   Upper Extremity Assessment Upper Extremity Assessment: Defer to OT evaluation (RA impacing hand ROM and MMT, B elbow replacements with noted limited B UE ROM and MMT deficits)    Lower Extremity Assessment Lower Extremity Assessment: Generalized weakness    Cervical / Trunk Assessment Cervical / Trunk Assessment: Kyphotic  Communication   Communication: No difficulties (increased volume  Simultaneous filing. User may not have seen previous data.)  Cognition Arousal/Alertness: Awake/alert (Simultaneous filing. User may not have seen previous data.) Behavior During Therapy: [facility] for tasks assessed/performed (Simultaneous filing. User may not have seen previous data.) Overall Cognitive Status: Within Functional Limits for tasks assessed (Simultaneous filing. User may not have seen previous data.)                                          General Comments      Exercises     Assessment/Plan    PT Assessment Patient needs continued PT services  PT Problem List Decreased strength;Decreased range of motion;Decreased activity tolerance;Decreased balance;Decreased mobility;Decreased coordination;Pain  PT Treatment Interventions DME instruction;Gait training;Functional mobility training;Therapeutic activities;Therapeutic exercise;Balance training;Neuromuscular re-education;Patient/family education    PT Goals (Current goals can be found in the Care Plan section)  Acute Rehab PT Goals Patient Stated Goal: to go home PT Goal Formulation: With patient Time For Goal Achievement: 06/10/22 Potential to Achieve Goals: Good    Frequency Min 1X/week     Co-evaluation               AM-PAC PT "6 Clicks"  Mobility  Outcome Measure Help needed turning from your back to your side while in a flat bed without using bedrails?: A Little Help needed moving from lying on your back to sitting on the side of a flat bed without using bedrails?: A Little Help needed moving to and from a bed to a chair (including a wheelchair)?: A Little Help needed standing up from a chair using your arms (e.g., wheelchair or bedside chair)?: A Little Help needed to walk in hospital room?: Total Help needed climbing 3-5 steps with a railing? : Total 6 Click Score: 14    End of Session Equipment Utilized During Treatment: Gait belt Activity Tolerance: Patient tolerated treatment well Patient left: Other (comment) (with OT on [facility]) Nurse Communication: Mobility status PT Visit Diagnosis: Unsteadiness on feet (R26.81);Other abnormalities of gait and mobility (R26.89);Muscle weakness (generalized) (M62.81);History of falling (Z91.81);Difficulty in walking, not elsewhere classified (R26.2);Pain Pain - Right/Left: Right Pain - part of body: Leg    Time: 1610-9604 PT Time Calculation (min) (ACUTE ONLY): 24 min   Charges:   PT Evaluation $PT Eval Low Complexity: 1 Low PT Treatments $Therapeutic Activity: 8-22 mins         Johnny Bridge, PT Acute Rehab   Jocelyn Bauer 05/27/2022, 10:53 AM

## 2022-05-28 DIAGNOSIS — L03115 Cellulitis of right lower limb: Secondary | ICD-10-CM | POA: Diagnosis not present

## 2022-05-28 LAB — BASIC METABOLIC PANEL
Anion gap: 8 (ref 5–15)
BUN: 11 mg/dL (ref 8–23)
CO2: 26 mmol/L (ref 22–32)
Calcium: 8.3 mg/dL — ABNORMAL LOW (ref 8.9–10.3)
Chloride: 104 mmol/L (ref 98–111)
Creatinine, Ser: 0.79 mg/dL (ref 0.44–1.00)
GFR, Estimated: >60 mL/min (ref >60)
Glucose, Bld: 95 mg/dL (ref 70–99)
Potassium: 3.4 mmol/L — ABNORMAL LOW (ref 3.5–5.1)
Sodium: 138 mmol/L (ref 135–145)

## 2022-05-28 MED ORDER — POTASSIUM CHLORIDE CRYS ER 20 MEQ PO TBCR
40.0000 meq | EXTENDED_RELEASE_TABLET | Freq: Once | ORAL | Status: AC
  Administered 2022-05-28: 40 meq via ORAL
  Filled 2022-05-28: qty 2

## 2022-05-28 MED ORDER — CEPHALEXIN 500 MG PO CAPS
500.0000 mg | ORAL_CAPSULE | Freq: Three times a day (TID) | ORAL | Status: AC
  Administered 2022-05-29 – 2022-06-02 (×15): 500 mg via ORAL
  Filled 2022-05-28 (×15): qty 1

## 2022-05-28 NOTE — TOC Progression Note (Signed)
Transition of Care [facility]) - Progression Note    Patient Details  Name: Jocelyn Bauer MRN: [id number] Date of Birth: [birth year]  Transition of Care [facility]) CM/SW Contact  Beckie Busing, RN Phone Number:[phone]  05/28/2022, 3:48 PM  Clinical Narrative:   [facility] acknowledges consult for "PT recommending HH, family requesting SNF." CM made MD & daughter [name] aware that SNF has to be a therapy recommendation in order for insurance to pay.  Daughter verbalizes understanding and MD has entered orders for PT to reevaluate patient for recommendations. CM will continue to follow.          Expected Discharge Plan and Services                                               Social Determinants of Health (SDOH) Interventions SDOH Screenings   Food Insecurity: No Food Insecurity (05/26/2022)  Housing: Low Risk  (05/26/2022)  Transportation Needs: No Transportation Needs (05/26/2022)  Utilities: Not At Risk (05/26/2022)  Depression (PHQ2-9): Low Risk  (02/05/2021)  Tobacco Use: Medium Risk (05/26/2022)    Readmission Risk Interventions     No data to display

## 2022-05-28 NOTE — Progress Notes (Signed)
Mobility Specialist - Progress Note   05/28/22 1341  Mobility  Activity Stood at bedside  Level of Assistance Moderate assist, patient does 50-74%  Assistive Device Front wheel walker  Activity Response Tolerated well  Mobility Referral Yes  $Mobility charge 1 Mobility  Mobility Specialist Start Time (ACUTE ONLY) 0110  Mobility Specialist Stop Time (ACUTE ONLY) 0140  Mobility Specialist Time Calculation (min) (ACUTE ONLY) 30 min   Pt received in bed and agreeable to mobility. Pt initially wanted to transfer to recliner. Once standing pt stated "I can't walk". Opted out to do 4x STS at EOB. Pt was MinA for bed mobility & ModA from sit>stand. No complaints during session. Pt to bed after session with all needs met. Bed alarm on.   [facility]

## 2022-05-28 NOTE — Progress Notes (Addendum)
PROGRESS NOTE  Jocelyn Bauer  XBJ:[id number] DOB: 1928-04-19 DOA: 05/26/2022 PCP: Rodrigo Ran, MD   Brief Narrative: Patient is a 89 year old female with history of anxiety, osteoarthritis, osteoporosis, rheumatoid arthritis, bilateral ankle fractures, hyperlipidemia, hypertension, hypothyroidism who presented with complaint of abdominal pain, right lower extremity edema, erythema.  On presentation she was afebrile, normotensive.  Labs showed WBC count of 7.8.  Chest x-ray did not show any acute disease.  CT of abdomen/pelvis did not show any acute findings but showed small nonobstructing bilateral calculi, compression fraction of inferior endplate of L4.  Patient was admitted for the management of cellulitis of right lower extremity, clinically improved after starting on antibiotics.  Patient recommending home health but daughter is requesting for SNF.  PT reevaluation pending.  Medically stable for discharge after disposition is sorted out  Assessment & Plan:  Principal Problem:   Cellulitis of right lower extremity Active Problems:   Hypertension   Hypothyroidism   Hyperlipidemia   GERD (gastroesophageal reflux disease)   Anxiety   Chronic neck and back pain   RA (rheumatoid arthritis) (HCC)   Normocytic anemia  Right lower extremity cellulitis: Presented with right lower extremity pain, swelling.  Started on ceftriaxone.  Currently she is afebrile,no leucocytosis. Looks much better today.  Antibiotics changed to oral. Doppler of the right lower extremity negative for DVT  Abdominal pain: Unclear etiology.  CT abdomen /pelvis did not show any acute findings.  Abdomen is benign on examination, abdomen pain has resolved  Hypertension: On amlodipine, benazepril.  Monitor blood pressure  Hypothyroidism: Continue Synthyroid  Hypokalemia: Supplemented  Hyperlipidemia: Intolerant to statin  GERD: Continue PPI  Anxiety: Continue sertraline, lorazepam  Chronic neck/back pain: On  Duragesic patch.  On oxycodone.  CT imaging showed compression fraction of inferior endplate of L4.  Continue supportive care, pain management  Rheumatoid  arthritis: On methotrexate.  Follows with rheumatology  Chronic hypoxic Respiratory failure: On 2 L of oxygen at home chronically.  She has few crackles bilaterally, likely associated with interstitial  lung disease with her history of rheumatoid arthritis  Lung nodule: A 5 mm left lung base nodule, present on the prior CT. not sure that a follow-up on this will be helpful due to her age  Normocytic anemia: Currently hemoglobin stable  Deconditioning/debility: Patient lives with her daughter and ambulates with the help of walker. She has hard time doing ADLs at home. PT recommended home health on discharge but daughter is requesting SNF.  TOC made aware.  PT evaluation consulted         DVT prophylaxis:SCDs Start: 05/26/22 3086     Code Status: DNR  Family Communication: Discussed with daughter [name] at bedside  Patient status:Inpatient  Patient is from :Home  Anticipated discharge VH:[facility] vs SnF  Estimated DC date:1-2 days   Consultants: None  Procedures:None  Antimicrobials:  Anti-infectives (From admission, onward)    Start     Dose/Rate Route Frequency Ordered Stop   05/29/22 0600  cephALEXin (KEFLEX) capsule 500 mg        500 mg Oral Every 8 hours 05/28/22 1228 06/03/22 0559   05/28/22 1000  cefTRIAXone (ROCEPHIN) 2 g in sodium chloride 0.9 % 100 mL IVPB  Status:  Discontinued        2 g 200 mL/hr over 30 Minutes Intravenous Every 24 hours 05/27/22 1118 05/27/22 1119   05/28/22 1000  cefTRIAXone (ROCEPHIN) 2 g in sodium chloride 0.9 % 100 mL IVPB  Status:  Discontinued  2 g 200 mL/hr over 30 Minutes Intravenous Every 24 hours 05/27/22 1119 05/28/22 1228   05/27/22 1215  cefTRIAXone (ROCEPHIN) 1 g in sodium chloride 0.9 % 100 mL IVPB        1 g 200 mL/hr over 30 Minutes Intravenous Every 24 hours  05/27/22 1119 05/27/22 1319   05/26/22 1000  cefTRIAXone (ROCEPHIN) 1 g in sodium chloride 0.9 % 100 mL IVPB  Status:  Discontinued        1 g 200 mL/hr over 30 Minutes Intravenous Every 24 hours 05/26/22 0853 05/27/22 1118   05/26/22 0200  ceFAZolin (ANCEF) IVPB 1 g/50 mL premix        1 g 100 mL/hr over 30 Minutes Intravenous  Once 05/26/22 0157 05/26/22 1610       Subjective: Patient seen and examined at bedside today.  Daughter at bedside too.  Remains very comfortable, no new complaints.  Right lower extremity edema has almost resolved, no erythema  Objective: Vitals:   05/27/22 0453 05/27/22 1353 05/27/22 2110 05/28/22 0421  BP: (!) 147/65 (!) 125/56 (!) 142/76 (!) 120/44  Pulse: 92 80 74 72  Resp: 18 20  18   Temp: 98.2 F (36.8 C) 98.4 F (36.9 C) 98 F (36.7 C) 98.3 F (36.8 C)  TempSrc: Oral Oral Oral Oral  SpO2: 100% 100% 100% 100%  Weight:      Height:        Intake/Output Summary (Last 24 hours) at 05/28/2022 1229 Last data filed at 05/28/2022 1100 Gross per 24 hour  Intake 940 ml  Output 1000 ml  Net -60 ml   Filed Weights   05/26/22 0017  Weight: 60.8 kg    Examination:  General exam: Overall comfortable, not in distress, pleasant generally female HEENT: PERRL Respiratory system:  no wheezes or crackles, few crackles on the base Cardiovascular system: S1 & S2 heard, RRR.  Gastrointestinal system: Abdomen is mildly distended, soft and nontender. Central nervous system: Alert and oriented Extremities: Trace edema of right lower extremity, no clubbing ,no cyanosis Skin: No rashes, no ulcers,no icterus       Data Reviewed: I have personally reviewed following labs and imaging studies  CBC: Recent Labs  Lab 05/26/22 0020 05/27/22 0538  WBC 7.8 5.8  NEUTROABS 6.7  --   HGB 9.3* 9.2*  HCT 30.9* 29.8*  MCV 97.5 98.3  PLT 412* 339   Basic Metabolic Panel: Recent Labs  Lab 05/26/22 0020 05/27/22 0533 05/27/22 0538 05/28/22 0547  NA 136  --   139 138  K 3.5  --  3.1* 3.4*  CL 102  --  105 104  CO2 25  --  27 26  GLUCOSE 121*  --  101* 95  BUN 21  --  12 11  CREATININE 0.85  --  0.75 0.79  CALCIUM 8.5*  --  8.3* 8.3*  MG  --  2.1  --   --      No results found for this or any previous visit (from the past 240 hour(s)).   Radiology Studies: VAS [place] LOWER EXTREMITY VENOUS (DVT)  Result Date: 05/27/2022  Lower Venous DVT Study Patient Name:  JENNASIS INGS  Date of Exam:   05/27/2022 Medical Rec #: [id number]        Accession #:    [id number] Date of Birth: 1928-01-12         Patient Gender: F Patient Age:   68 years Exam Location:  [facility] Procedure:  VAS [place] LOWER EXTREMITY VENOUS (DVT) Referring Phys: Ritisha Deitrick --------------------------------------------------------------------------------  Indications: Edema.  Limitations: Body habitus, poor ultrasound/tissue interface and patient inability to position properly. Comparison Study: No previous exams Performing Technologist: Jody Hill RVT, RDMS  Examination Guidelines: A complete evaluation includes B-mode imaging, spectral Doppler, color Doppler, and power Doppler as needed of all accessible portions of each vessel. Bilateral testing is considered an integral part of a complete examination. Limited examinations for reoccurring indications may be performed as noted. The reflux portion of the exam is performed with the patient in reverse Trendelenburg.  +---------+---------------+---------+-----------+----------+--------------+ RIGHT    CompressibilityPhasicitySpontaneityPropertiesThrombus Aging +---------+---------------+---------+-----------+----------+--------------+ CFV      Full           Yes      Yes                                 +---------+---------------+---------+-----------+----------+--------------+ SFJ      Full                                                         +---------+---------------+---------+-----------+----------+--------------+ FV Prox  Full           Yes      Yes                                 +---------+---------------+---------+-----------+----------+--------------+ FV Mid   Full           Yes      Yes                                 +---------+---------------+---------+-----------+----------+--------------+ FV DistalFull           Yes      Yes                                 +---------+---------------+---------+-----------+----------+--------------+ PFV      Full                                                        +---------+---------------+---------+-----------+----------+--------------+ POP      Full           Yes      Yes                                 +---------+---------------+---------+-----------+----------+--------------+ PTV                                                   Not seen       +---------+---------------+---------+-----------+----------+--------------+ PERO  Not seen       +---------+---------------+---------+-----------+----------+--------------+   Right Technical Findings: Not visualized segments include peroneal and posterior tibial veins.  +----+---------------+---------+-----------+----------+--------------+ LEFTCompressibilityPhasicitySpontaneityPropertiesThrombus Aging +----+---------------+---------+-----------+----------+--------------+ CFV Full           Yes      Yes                                 +----+---------------+---------+-----------+----------+--------------+    Summary: RIGHT: - There is no evidence of deep vein thrombosis in the lower extremity. However, portions of this examination were limited- see technologist comments above.  - No cystic structure found in the popliteal fossa.  LEFT: - No evidence of common femoral vein obstruction.  *See table(s) above for measurements and observations. Electronically  signed by Sherald Hess MD on 05/27/2022 at 4:34:47 PM.    Final     Scheduled Meds:  amLODipine  5 mg Oral Daily   benazepril  20 mg Oral q morning   [START ON 05/29/2022] cephALEXin  500 mg Oral Q8H   cholecalciferol  2,000 Units Oral Daily   cyanocobalamin  1,000 mcg Oral Daily   fentaNYL  1 patch Transdermal Q72H   levothyroxine  125 mcg Oral Q0600   LORazepam  0.5 mg Oral BID   pantoprazole  40 mg Oral Daily   polyethylene glycol  17 g Oral Daily   predniSONE  5 mg Oral Daily   senna-docusate  1 tablet Oral BID   sertraline  50 mg Oral Daily   Continuous Infusions:     LOS: 2 days   Burnadette Pop, MD [facility] P5/21/2024, 12:29 PM

## 2022-05-29 DIAGNOSIS — L03115 Cellulitis of right lower limb: Secondary | ICD-10-CM | POA: Diagnosis not present

## 2022-05-29 LAB — BASIC METABOLIC PANEL
Anion gap: 7 (ref 5–15)
BUN: 13 mg/dL (ref 8–23)
CO2: 27 mmol/L (ref 22–32)
Calcium: 8.4 mg/dL — ABNORMAL LOW (ref 8.9–10.3)
Chloride: 103 mmol/L (ref 98–111)
Creatinine, Ser: 0.79 mg/dL (ref 0.44–1.00)
GFR, Estimated: >60 mL/min (ref >60)
Glucose, Bld: 100 mg/dL — ABNORMAL HIGH (ref 70–99)
Potassium: 3.7 mmol/L (ref 3.5–5.1)
Sodium: 137 mmol/L (ref 135–145)

## 2022-05-29 MED ORDER — ORAL CARE MOUTH RINSE: 15.0000 mL | OROMUCOSAL | Status: DC | PRN

## 2022-05-29 NOTE — Progress Notes (Signed)
Physical Therapy Treatment Patient Details Name: Jocelyn Bauer MRN: [id number] DOB: 1928-05-07 Today's Date: 05/29/2022   History of Present Illness 89 yo female admitted to hospital on 05/26/2022 due to radiating RLQ abdominal pain, RLE edema, weeping, rubor and pain. Imaging completed including CXR and CT of abdomen/pelvis negative for acute findings, with noted known L4 compression fx. Abn lab findings. Pt was found to have pain associated with R LE cellulitis impacting pts safety and I with functional mobility tasks. Pt requires 2 L/min supplemental O2 at baseline. Pt has PMH including but not limited to: RA, B ankle fx s/p ORIF, chronic neck and LBP, HDL, HTN,  B total elbow replacement, and L THA .    PT Comments     Pt admitted with above diagnosis.  Pt currently with functional limitations due to the deficits listed below (see PT Problem List). Pt in bed when PT arrived, pts daughter present. PT reviewed TOC note and communicated with nurse prior to entering room per family requesting short stay rehab at [facility]. Daughter was adamant that pt will require SNF placement due to the fact her caregivers are unable to provide physical assist in home setting. Daughter indicated that she has been in the hospital in the past and then went to rehab. Pt will benefit from therapy services and S and A in the next venue and continues to present with generalized weakness and is limited in safety and I with functional mobility tasks. Pt required increased time and cues with min guard for supine to sit, min guard for STS from EOB and recliner with pull to stand at RW and min A for STS from commode.  Pt demonstrated improved ability with gait tasks 6 feet x 3 with min guard, youth RW and recliner close. Pt maintained O2 saturation on RA >/=93% on RA with exertion and 98-99% on RA at rest. Pt reported mild R LE pain associated with cellulitis and denied dizziness and SOB. Pt left seated in recliner, all needs in place,  daughter present and on RA.  Pt will benefit from acute skilled PT to increase their independence and safety with mobility to allow discharge.     Recommendations for follow up therapy are one component of a multi-disciplinary discharge planning process, led by the attending physician.  Recommendations may be updated based on patient status, additional functional criteria and insurance authorization.  Follow Up Recommendations       Assistance Recommended at Discharge Frequent or constant Supervision/Assistance  Patient can return home with the following Assistance with cooking/housework;Direct supervision/assist for medications management;Direct supervision/assist for financial management;Assist for transportation;Help with stairs or ramp for entrance;A little help with walking and/or transfers;A little help with bathing/dressing/bathroom   Equipment Recommendations  None recommended by PT (pt reports DME in home setting)    Recommendations for Other Services       Precautions / Restrictions Precautions Precautions: Fall Restrictions Weight Bearing Restrictions: No     Mobility  Bed Mobility Overal bed mobility: Needs Assistance Bed Mobility: Supine to Sit     Supine to sit: HOB elevated, Min guard     General bed mobility comments: increased time and cues for encouragment to complete IND    Transfers Overall transfer level: Needs assistance Equipment used: Rolling walker (2 wheels) Transfers: Sit to/from Stand Sit to Stand: Min guard           General transfer comment: pull to stand from EOB, recliner and commode. pt required min A for STS  from commode.    Ambulation/Gait Ambulation/Gait assistance: Min guard Gait Distance (Feet): 6 Feet Assistive device: Rolling walker (2 wheels) Gait Pattern/deviations: Step-to pattern, Shuffle, Trunk flexed Gait velocity: decreased         Stairs             Wheelchair Mobility    Modified Rankin (Stroke  Patients Only)       Balance Overall balance assessment: History of Falls, Needs assistance Sitting-balance support: Feet supported Sitting balance-Leahy Scale: Fair     Standing balance support: Bilateral upper extremity supported, During functional activity, Reliant on assistive device for balance Standing balance-Leahy Scale: Poor                              Cognition Arousal/Alertness: Awake/alert Behavior During Therapy: WFL for tasks assessed/performed Overall Cognitive Status: Within Functional Limits for tasks assessed                                          Exercises      General Comments        Pertinent Vitals/Pain Pain Assessment Pain Assessment: Faces Faces Pain Scale: Hurts a little bit Pain Location: R distal LE Pain Descriptors / Indicators: Guarding Pain Intervention(s): Limited activity within patient's tolerance, Monitored during session    Home Living Family/patient expects to be discharged to:: Private residence Living Arrangements: Children Available Help at Discharge: Family;Available PRN/intermittently (Daughter in law helps some but not available 23/7.) Type of Home: House Home Access: Ramped entrance       Home Layout: One level Home Equipment: Agricultural consultant (2 wheels);Cane - single point;Wheelchair - Dentist (4 wheels)      Prior Function            PT Goals (current goals can now be found in the care plan section) Acute Rehab PT Goals Patient Stated Goal: to go home PT Goal Formulation: With patient Time For Goal Achievement: 06/10/22 Potential to Achieve Goals: Good    Frequency    Min 1X/week      PT Plan      Co-evaluation              AM-PAC PT "6 Clicks" Mobility   Outcome Measure  Help needed turning from your back to your side while in a flat bed without using bedrails?: A Little Help needed moving from lying on your back to sitting on the  side of a flat bed without using bedrails?: A Little Help needed moving to and from a bed to a chair (including a wheelchair)?: A Little Help needed standing up from a chair using your arms (e.g., wheelchair or bedside chair)?: A Little Help needed to walk in hospital room?: Total Help needed climbing 3-5 steps with a railing? : Total 6 Click Score: 14    End of Session Equipment Utilized During Treatment: Gait belt Activity Tolerance: Patient tolerated treatment well Patient left: with call bell/phone within reach;with chair alarm set;with family/visitor present (with OT on [facility]) Nurse Communication: Mobility status PT Visit Diagnosis: Unsteadiness on feet (R26.81);Other abnormalities of gait and mobility (R26.89);Muscle weakness (generalized) (M62.81);History of falling (Z91.81);Difficulty in walking, not elsewhere classified (R26.2);Pain Pain - Right/Left: Right Pain - part of body: Leg     Time: 1127-1201 PT Time Calculation (min) (ACUTE ONLY): 34 min  Charges:  $Gait  Training: 8-22 mins $Therapeutic Activity: 8-22 mins                     Johnny Bridge, PT Acute Rehab    Jacqualyn Posey 05/29/2022, 1:26 PM

## 2022-05-29 NOTE — Progress Notes (Signed)
PROGRESS NOTE    JEANMARIE SOLLMAN  ZOX:[id number] DOB: [birth year] DOA: 05/26/2022 PCP: Rodrigo Ran, MD   Brief Narrative:  This 89 year old female with history of anxiety, osteoarthritis, osteoporosis, rheumatoid arthritis, bilateral ankle fractures, hyperlipidemia, hypertension, hypothyroidism who presented  in the  ED with complaints of abdominal pain, right lower extremity edema, erythema.  On presentation she was afebrile, normotensive.  Labs showed WBC count of 7.8.  Chest x-ray did not show any acute disease. CT of abdomen/pelvis did not show any acute findings but showed small non- obstructing bilateral calculi, compression fraction of inferior endplate of L4.  Patient was admitted for the management of cellulitis of right lower extremity, clinically improved after starting on antibiotics.  PT recommending home health but daughter is requesting for SNF.  PT reevaluation pending.  Medically stable for discharge after disposition is sorted out.  Assessment & Plan:   Principal Problem:   Cellulitis of right lower extremity Active Problems:   Hypertension   Hypothyroidism   Hyperlipidemia   GERD (gastroesophageal reflux disease)   Anxiety   Chronic neck and back pain   RA (rheumatoid arthritis) (HCC)   Normocytic anemia  Right lower extremity cellulitis:  She presented with right lower extremity pain, swelling.   She is started on ceftriaxone.  Currently she is afebrile, no leucocytosis. Looks much better today.  Antibiotics changed to oral keflex for 5 days. Doppler of the right lower extremity negative for DVT.   Abdominal pain:  Unclear etiology.  CT abdomen /pelvis did not show any acute findings.   Abdomen is benign on examination, abdomen pain has resolved   Hypertension:  Continue  amlodipine, benazepril.  Monitor blood pressure   Hypothyroidism:  Continue Synthyroid   Hypokalemia:  Replaced. Resolved.   Hyperlipidemia:  Intolerant to statin.   GERD: Continue  PPI   Anxiety:  Continue sertraline, lorazepam.   Chronic neck/back pain:  Continue Duragesic patch and  oxycodone.   CT imaging showed compression fraction of inferior endplate of L4.   Continue supportive care, pain management   Rheumatoid  arthritis:  Continue  methotrexate.  Follows with rheumatology   Chronic hypoxic Respiratory failure:  On 2 L of oxygen at home chronically.   She has few crackles bilaterally, likely associated with interstitial  lung disease with her history of rheumatoid arthritis   Lung nodule:  A 5 mm left lung base nodule, present on the prior CT. Not sure that a follow-up on this will be helpful due to her age.   Normocytic anemia: Currently hemoglobin stable.  No signs of any visible bleeding   Deconditioning/debility:  Patient lives with her daughter and ambulates with the help of walker. She has hard time doing ADLs at home. PT recommended home health on discharge but daughter is requesting SNF.  TOC made aware. Repeat PT evaluation recommended SNF.    DVT prophylaxis: SCDs Code Status: DNR Family Communication: Daughter at bed side. Disposition Plan:     Status is: Inpatient Remains inpatient appropriate because: Admitted for right lower extremity cellulitis which has been improved with IV antibiotics.  Patient is very deconditioned and needs skilled nursing facility.    Consultants:  None  Procedures: None  Antimicrobials: Anti-infectives (From admission, onward)    Start     Dose/Rate Route Frequency Ordered Stop   05/29/22 0600  cephALEXin (KEFLEX) capsule 500 mg        500 mg Oral Every 8 hours 05/28/22 1228 06/03/22 0559   05/28/22 1000  cefTRIAXone (ROCEPHIN) 2 g in sodium chloride 0.9 % 100 mL IVPB  Status:  Discontinued        2 g 200 mL/hr over 30 Minutes Intravenous Every 24 hours 05/27/22 1118 05/27/22 1119   05/28/22 1000  cefTRIAXone (ROCEPHIN) 2 g in sodium chloride 0.9 % 100 mL IVPB  Status:  Discontinued        2  g 200 mL/hr over 30 Minutes Intravenous Every 24 hours 05/27/22 1119 05/28/22 1228   05/27/22 1215  cefTRIAXone (ROCEPHIN) 1 g in sodium chloride 0.9 % 100 mL IVPB        1 g 200 mL/hr over 30 Minutes Intravenous Every 24 hours 05/27/22 1119 05/27/22 1319   05/26/22 1000  cefTRIAXone (ROCEPHIN) 1 g in sodium chloride 0.9 % 100 mL IVPB  Status:  Discontinued        1 g 200 mL/hr over 30 Minutes Intravenous Every 24 hours 05/26/22 0853 05/27/22 1118   05/26/22 0200  ceFAZolin (ANCEF) IVPB 1 g/50 mL premix        1 g 100 mL/hr over 30 Minutes Intravenous  Once 05/26/22 0157 05/26/22 0704      Subjective: She was seen and examined at bedside.  Overnight events noted. Daughter is bedside, states patient is very weak and deconditioned and wants patient to be discharged to SNF instead of home.  Objective: Vitals:   05/28/22 1316 05/28/22 2022 05/29/22 0503 05/29/22 1309  BP: (!) 121/56 138/71 (!) 143/65 (!) 125/52  Pulse: 83 79 80 88  Resp: 18 16 18 18   Temp: 97.7 F (36.5 C) 98.2 F (36.8 C) 98.2 F (36.8 C) 98.2 F (36.8 C)  TempSrc: Oral Oral Oral Oral  SpO2: 100% 100% 99% 97%  Weight:      Height:        Intake/Output Summary (Last 24 hours) at 05/29/2022 1350 Last data filed at 05/29/2022 0800 Gross per 24 hour  Intake 240 ml  Output 400 ml  Net -160 ml   Filed Weights   05/26/22 0017  Weight: 60.8 kg    Examination:  General exam: Appears calm and comfortable, deconditioned, elderly frail. Respiratory system: Clear to auscultation. Respiratory effort normal.  RR 15 Cardiovascular system: S1 & S2 heard, RRR. No JVD, murmurs, rubs, gallops or clicks. No pedal edema. Gastrointestinal system: Abdomen is soft, non tender, non distended, BS+ Central nervous system: Alert and oriented x 3. No focal neurological deficits. Extremities: No edema, no cyanosis, no clubbing.  Cellulitis improved. Skin: No rashes, lesions or ulcers Psychiatry: Judgement and insight appear  normal. Mood & affect appropriate.     Data Reviewed: I have personally reviewed following labs and imaging studies  CBC: Recent Labs  Lab 05/26/22 0020 05/27/22 0538  WBC 7.8 5.8  NEUTROABS 6.7  --   HGB 9.3* 9.2*  HCT 30.9* 29.8*  MCV 97.5 98.3  PLT 412* 339   Basic Metabolic Panel: Recent Labs  Lab 05/26/22 0020 05/27/22 0533 05/27/22 0538 05/28/22 0547 05/29/22 0609  NA 136  --  139 138 137  K 3.5  --  3.1* 3.4* 3.7  CL 102  --  105 104 103  CO2 25  --  27 26 27   GLUCOSE 121*  --  101* 95 100*  BUN 21  --  12 11 13   CREATININE 0.85  --  0.75 0.79 0.79  CALCIUM 8.5*  --  8.3* 8.3* 8.4*  MG  --  2.1  --   --   --  GFR: Estimated Creatinine Clearance: 38.5 mL/min (by C-G formula based on SCr of 0.79 mg/dL). Liver Function Tests: Recent Labs  Lab 05/26/22 0020 05/27/22 0538  AST 43* 35  ALT 21 18  ALKPHOS 42 35*  BILITOT 0.6 0.3  PROT 6.8 6.0*  ALBUMIN 3.6 3.2*   Recent Labs  Lab 05/26/22 0020  LIPASE 24   No results for input(s): "AMMONIA" in the last 168 hours. Coagulation Profile: No results for input(s): "INR", "PROTIME" in the last 168 hours. Cardiac Enzymes: No results for input(s): "CKTOTAL", "CKMB", "CKMBINDEX", "TROPONINI" in the last 168 hours. BNP (last 3 results) No results for input(s): "PROBNP" in the last 8760 hours. HbA1C: No results for input(s): "HGBA1C" in the last 72 hours. CBG: No results for input(s): "GLUCAP" in the last 168 hours. Lipid Profile: No results for input(s): "CHOL", "HDL", "LDLCALC", "TRIG", "CHOLHDL", "LDLDIRECT" in the last 72 hours. Thyroid Function Tests: No results for input(s): "TSH", "T4TOTAL", "FREET4", "T3FREE", "THYROIDAB" in the last 72 hours. Anemia Panel: No results for input(s): "VITAMINB12", "FOLATE", "FERRITIN", "TIBC", "IRON", "RETICCTPCT" in the last 72 hours. Sepsis Labs: Recent Labs  Lab 05/26/22 0020  LATICACIDVEN 1.0    No results found for this or any previous visit (from the  past 240 hour(s)).   Radiology Studies: VAS [place] LOWER EXTREMITY VENOUS (DVT)  Result Date: 05/27/2022  Lower Venous DVT Study Patient Name:  JEZABELLE TWAIT  Date of Exam:   05/27/2022 Medical Rec #: [id number]        Accession #:    [id number] Date of Birth: 07-Jan-1929         Patient Gender: F Patient Age:   53 years Exam Location:  [facility] Procedure:      VAS [place] LOWER EXTREMITY VENOUS (DVT) Referring Phys: AMRIT ADHIKARI --------------------------------------------------------------------------------  Indications: Edema.  Limitations: Body habitus, poor ultrasound/tissue interface and patient inability to position properly. Comparison Study: No previous exams Performing Technologist: Jody Hill RVT, RDMS  Examination Guidelines: A complete evaluation includes B-mode imaging, spectral Doppler, color Doppler, and power Doppler as needed of all accessible portions of each vessel. Bilateral testing is considered an integral part of a complete examination. Limited examinations for reoccurring indications may be performed as noted. The reflux portion of the exam is performed with the patient in reverse Trendelenburg.  +---------+---------------+---------+-----------+----------+--------------+ RIGHT    CompressibilityPhasicitySpontaneityPropertiesThrombus Aging +---------+---------------+---------+-----------+----------+--------------+ CFV      Full           Yes      Yes                                 +---------+---------------+---------+-----------+----------+--------------+ SFJ      Full                                                        +---------+---------------+---------+-----------+----------+--------------+ FV Prox  Full           Yes      Yes                                 +---------+---------------+---------+-----------+----------+--------------+ FV Mid   Full           Yes  Yes                                  +---------+---------------+---------+-----------+----------+--------------+ FV DistalFull           Yes      Yes                                 +---------+---------------+---------+-----------+----------+--------------+ PFV      Full                                                        +---------+---------------+---------+-----------+----------+--------------+ POP      Full           Yes      Yes                                 +---------+---------------+---------+-----------+----------+--------------+ PTV                                                   Not seen       +---------+---------------+---------+-----------+----------+--------------+ PERO                                                  Not seen       +---------+---------------+---------+-----------+----------+--------------+   Right Technical Findings: Not visualized segments include peroneal and posterior tibial veins.  +----+---------------+---------+-----------+----------+--------------+ LEFTCompressibilityPhasicitySpontaneityPropertiesThrombus Aging +----+---------------+---------+-----------+----------+--------------+ CFV Full           Yes      Yes                                 +----+---------------+---------+-----------+----------+--------------+    Summary: RIGHT: - There is no evidence of deep vein thrombosis in the lower extremity. However, portions of this examination were limited- see technologist comments above.  - No cystic structure found in the popliteal fossa.  LEFT: - No evidence of common femoral vein obstruction.  *See table(s) above for measurements and observations. Electronically signed by Sherald Hess MD on 05/27/2022 at 4:34:47 PM.    Final     Scheduled Meds:  amLODipine  5 mg Oral Daily   benazepril  20 mg Oral q morning   cephALEXin  500 mg Oral Q8H   cholecalciferol  2,000 Units Oral Daily   cyanocobalamin  1,000 mcg Oral Daily   fentaNYL  1 patch Transdermal  Q72H   levothyroxine  125 mcg Oral Q0600   LORazepam  0.5 mg Oral BID   pantoprazole  40 mg Oral Daily   polyethylene glycol  17 g Oral Daily   predniSONE  5 mg Oral Daily   senna-docusate  1 tablet Oral BID   sertraline  50 mg Oral Daily   Continuous Infusions:   LOS: 3 days    Time spent: 50 Mins  Willeen Niece, MD [facility]   If 7PM-7AM, please contact night-coverage

## 2022-05-30 DIAGNOSIS — L03115 Cellulitis of right lower limb: Secondary | ICD-10-CM | POA: Diagnosis not present

## 2022-05-30 NOTE — Progress Notes (Signed)
PROGRESS NOTE    Jocelyn Bauer  ZOX:[id number] DOB: 04-11-1928 DOA: 05/26/2022 PCP: Rodrigo Ran, MD   Brief Narrative:  This 89 year old female with history of anxiety, osteoarthritis, osteoporosis, rheumatoid arthritis, bilateral ankle fractures, hyperlipidemia, hypertension, hypothyroidism who presented  in the  ED with complaints of abdominal pain, right lower extremity edema, erythema.  On presentation she was afebrile, normotensive.  Labs showed WBC count of 7.8.  Chest x-ray did not show any acute disease. CT of abdomen/pelvis did not show any acute findings but showed small non- obstructing bilateral calculi, compression fraction of inferior endplate of L4.  Patient was admitted for the management of cellulitis of right lower extremity, clinically improved after starting on antibiotics.  PT recommending home health but daughter is requesting for SNF.  PT reevaluation pending.  Medically stable for discharge after disposition is sorted out.  Assessment & Plan:   Principal Problem:   Cellulitis of right lower extremity Active Problems:   Hypertension   Hypothyroidism   Hyperlipidemia   GERD (gastroesophageal reflux disease)   Anxiety   Chronic neck and back pain   RA (rheumatoid arthritis) (HCC)   Normocytic anemia  Right lower extremity cellulitis:  She presented with right lower extremity pain, and swelling.   She is started on ceftriaxone.  Currently she is afebrile, no leucocytosis. Looks much better today.  Antibiotics changed to oral keflex for 5 days. Doppler of the right lower extremity negative for DVT.   Abdominal pain:  Unclear etiology.  CT abdomen /pelvis did not show any acute findings.   Abdomen is benign on examination, abdomen pain has resolved   Hypertension:  Continue amlodipine, benazepril.   Monitor blood pressure   Hypothyroidism:  Continue Synthroid   Hypokalemia:  Replaced. Resolved.   Hyperlipidemia:  Intolerant to statin.   GERD: Continue  PPI   Anxiety:  Continue sertraline, lorazepam.   Chronic neck/back pain:  Continue Duragesic patch and  oxycodone.   CT imaging showed compression fraction of inferior endplate of L4.   Continue supportive care, pain management   Rheumatoid  arthritis:  Continue  methotrexate.  Follows with rheumatology   Chronic hypoxic Respiratory failure:  On 2 L of oxygen at home chronically.    Lung nodule:  A 5 mm left lung base nodule, present on the prior CT. Not sure that a follow-up on this will be helpful due to her age.   Normocytic anemia: Currently hemoglobin stable.  No signs of any visible bleeding.   Deconditioning/debility:  Patient lives with her daughter and ambulates with the help of walker. She has hard time doing ADLs at home. PT recommended home health on discharge but daughter is requesting SNF.  TOC made aware. Repeat PT evaluation recommended SNF.    DVT prophylaxis: SCDs Code Status: DNR Family Communication: Daughter at bed side. Disposition Plan:     Status is: Inpatient Remains inpatient appropriate because: Admitted for right lower extremity cellulitis which has been improved with IV antibiotics.  Patient is very deconditioned and needs skilled nursing facility.    Consultants:  None  Procedures: None  Antimicrobials: Anti-infectives (From admission, onward)    Start     Dose/Rate Route Frequency Ordered Stop   05/29/22 0600  cephALEXin (KEFLEX) capsule 500 mg        500 mg Oral Every 8 hours 05/28/22 1228 06/03/22 0559   05/28/22 1000  cefTRIAXone (ROCEPHIN) 2 g in sodium chloride 0.9 % 100 mL IVPB  Status:  Discontinued  2 g 200 mL/hr over 30 Minutes Intravenous Every 24 hours 05/27/22 1118 05/27/22 1119   05/28/22 1000  cefTRIAXone (ROCEPHIN) 2 g in sodium chloride 0.9 % 100 mL IVPB  Status:  Discontinued        2 g 200 mL/hr over 30 Minutes Intravenous Every 24 hours 05/27/22 1119 05/28/22 1228   05/27/22 1215  cefTRIAXone (ROCEPHIN) 1 g  in sodium chloride 0.9 % 100 mL IVPB        1 g 200 mL/hr over 30 Minutes Intravenous Every 24 hours 05/27/22 1119 05/27/22 1319   05/26/22 1000  cefTRIAXone (ROCEPHIN) 1 g in sodium chloride 0.9 % 100 mL IVPB  Status:  Discontinued        1 g 200 mL/hr over 30 Minutes Intravenous Every 24 hours 05/26/22 0853 05/27/22 1118   05/26/22 0200  ceFAZolin (ANCEF) IVPB 1 g/50 mL premix        1 g 100 mL/hr over 30 Minutes Intravenous  Once 05/26/22 0157 05/26/22 0704      Subjective: She was seen and examined at bedside.  Overnight events noted. Patient reports she is very weak and deconditioned.   Patient is awaiting SNF placement , insurance authorization.   Objective: Vitals:   05/29/22 0503 05/29/22 1309 05/29/22 2052 05/30/22 0535  BP: (!) 143/65 (!) 125/52 (!) 111/54 (!) 146/66  Pulse: 80 88 88 73  Resp: 18 18 16 18   Temp: 98.2 F (36.8 C) 98.2 F (36.8 C) 98 F (36.7 C) 97.8 F (36.6 C)  TempSrc: Oral Oral Oral Oral  SpO2: 99% 97% 94% 100%  Weight:      Height:        Intake/Output Summary (Last 24 hours) at 05/30/2022 1250 Last data filed at 05/30/2022 1213 Gross per 24 hour  Intake 360 ml  Output 1550 ml  Net -1190 ml   Filed Weights   05/26/22 0017  Weight: 60.8 kg    Examination:  General exam: Appears calm and comfortable, deconditioned, elderly frail. Respiratory system: CTA bilaterally, respiratory effort normal, RR 14 Cardiovascular system: S1-S2 heard, regular rate and rhythm, no murmur. Gastrointestinal system: Abdomen is soft, non tender, non distended, BS+ Central nervous system: Alert and oriented x 3. No focal neurological deficits. Extremities: No edema, no cyanosis, no clubbing.  Cellulitis improved. Skin: No rashes, lesions or ulcers Psychiatry: Judgement and insight appear normal. Mood & affect appropriate.     Data Reviewed: I have personally reviewed following labs and imaging studies  CBC: Recent Labs  Lab 05/26/22 0020  05/27/22 0538  WBC 7.8 5.8  NEUTROABS 6.7  --   HGB 9.3* 9.2*  HCT 30.9* 29.8*  MCV 97.5 98.3  PLT 412* 339   Basic Metabolic Panel: Recent Labs  Lab 05/26/22 0020 05/27/22 0533 05/27/22 0538 05/28/22 0547 05/29/22 0609  NA 136  --  139 138 137  K 3.5  --  3.1* 3.4* 3.7  CL 102  --  105 104 103  CO2 25  --  27 26 27   GLUCOSE 121*  --  101* 95 100*  BUN 21  --  12 11 13   CREATININE 0.85  --  0.75 0.79 0.79  CALCIUM 8.5*  --  8.3* 8.3* 8.4*  MG  --  2.1  --   --   --    GFR: Estimated Creatinine Clearance: 38.5 mL/min (by C-G formula based on SCr of 0.79 mg/dL). Liver Function Tests: Recent Labs  Lab 05/26/22 0020 05/27/22 1610  AST 43* 35  ALT 21 18  ALKPHOS 42 35*  BILITOT 0.6 0.3  PROT 6.8 6.0*  ALBUMIN 3.6 3.2*   Recent Labs  Lab 05/26/22 0020  LIPASE 24   No results for input(s): "AMMONIA" in the last 168 hours. Coagulation Profile: No results for input(s): "INR", "PROTIME" in the last 168 hours. Cardiac Enzymes: No results for input(s): "CKTOTAL", "CKMB", "CKMBINDEX", "TROPONINI" in the last 168 hours. BNP (last 3 results) No results for input(s): "PROBNP" in the last 8760 hours. HbA1C: No results for input(s): "HGBA1C" in the last 72 hours. CBG: No results for input(s): "GLUCAP" in the last 168 hours. Lipid Profile: No results for input(s): "CHOL", "HDL", "LDLCALC", "TRIG", "CHOLHDL", "LDLDIRECT" in the last 72 hours. Thyroid Function Tests: No results for input(s): "TSH", "T4TOTAL", "FREET4", "T3FREE", "THYROIDAB" in the last 72 hours. Anemia Panel: No results for input(s): "VITAMINB12", "FOLATE", "FERRITIN", "TIBC", "IRON", "RETICCTPCT" in the last 72 hours. Sepsis Labs: Recent Labs  Lab 05/26/22 0020  LATICACIDVEN 1.0    No results found for this or any previous visit (from the past 240 hour(s)).   Radiology Studies: No results found.  Scheduled Meds:  amLODipine  5 mg Oral Daily   benazepril  20 mg Oral q morning   cephALEXin  500  mg Oral Q8H   cholecalciferol  2,000 Units Oral Daily   cyanocobalamin  1,000 mcg Oral Daily   fentaNYL  1 patch Transdermal Q72H   levothyroxine  125 mcg Oral Q0600   LORazepam  0.5 mg Oral BID   pantoprazole  40 mg Oral Daily   polyethylene glycol  17 g Oral Daily   predniSONE  5 mg Oral Daily   senna-docusate  1 tablet Oral BID   sertraline  50 mg Oral Daily   Continuous Infusions:   LOS: 4 days    Time spent: 35 Mins    Willeen Niece, MD [facility]   If 7PM-7AM, please contact night-coverage

## 2022-05-30 NOTE — [place]2 (Signed)
[place] MEDICAID FL2 LEVEL OF CARE FORM     IDENTIFICATION  Patient Name: Jocelyn Bauer Birthdate: April 01, 1928 Sex: female Admission Date (Current Location): 05/26/2022  [facility] and [place] Number:  Producer, television/film/video and Address:  [facility],  [address] [place], [place] [address]      Provider Number: [id number]  Attending Physician Name and Address:  Willeen Niece, MD  Relative Name and Phone Number:  Allene Pyo [phone]    Current Level of Care: Hospital Recommended Level of Care: Skilled Nursing Facility Prior Approval Number:    Date Approved/Denied:   PASRR Number: [id number]  Discharge Plan: SNF    Current Diagnoses: Patient Active Problem List   Diagnosis Date Noted   Cellulitis of right lower extremity 05/26/2022   Normocytic anemia 05/26/2022   Shortness of breath 07/26/2021   Pneumonia due to infectious organism 06/28/2021   Palliative care encounter 06/25/2021   RA (rheumatoid arthritis) (HCC) 03/17/2021   DNR (do not resuscitate) 03/17/2021   Infected hardware in right leg (HCC)    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)    Contusion of left elbow 06/14/2020   Closed trimalleolar fracture of right ankle    Chronic, continuous use of opioids 07/28/2019   Right elbow pain 07/14/2015   Presence of right artificial elbow joint 07/13/2015   Hypertension    Hypothyroidism    Hyperlipidemia    GERD (gastroesophageal reflux disease)    Anxiety    Osteoporosis    Chronic neck and back pain    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    Orientation RESPIRATION BLADDER Height & Weight     Self, Time, Situation, Place  Normal Incontinent Weight: 60.8 kg Height:  5\' 2"  (157.5 cm)  BEHAVIORAL SYMPTOMS/MOOD NEUROLOGICAL BOWEL NUTRITION STATUS     (n/a) Continent Diet  AMBULATORY STATUS COMMUNICATION OF NEEDS Skin   Limited Assist Verbally Other (Comment) (redness right lower leg)                        Personal Care Assistance Level of Assistance  Bathing, Feeding, Dressing Bathing Assistance: Limited assistance Feeding assistance: Limited assistance Dressing Assistance: Limited assistance     Functional Limitations Info  Sight, Hearing, Speech Sight Info: Adequate Hearing Info: Adequate Speech Info: Adequate    SPECIAL CARE FACTORS FREQUENCY  PT (By licensed PT), OT (By licensed OT)     PT Frequency: 5X/wk OT Frequency: 5X/wk            Contractures Contractures Info: Present (right fingers)    Additional Factors Info  Code Status, Allergies, Psychotropic, Insulin Sliding Scale, Isolation Precautions, Suctioning Needs Code Status Info: DNR Allergies Info: Contrast Media (Iodinated Contrast Media), Tdap (Tetanus-diphth-acell Pertussis), Cholestyramine, Duloxetine Hcl, Etidronate, Ezetimibe-simvastatin, Hydromorphone, Pravastatin, Streptococcus (Diplococcus) Pneumoniae (Streptococci) Psychotropic Info: see discharge summary Insulin Sliding Scale Info: see discharge summary Isolation Precautions Info: n/a Suctioning Needs: n/a   Current Medications (05/30/2022):  This is the current hospital active medication list Current Facility-Administered Medications  Medication Dose Route Frequency Provider Last Rate Last Admin   acetaminophen (TYLENOL) tablet 650 mg  650 mg Oral Q6H PRN Bobette Mo, MD       Or   acetaminophen (TYLENOL) suppository 650 mg  650 mg Rectal Q6H PRN Bobette Mo, MD       albuterol (PROVENTIL) (2.5 MG/3ML) 0.083% nebulizer solution 2.5 mg  2.5 mg Nebulization Q4H PRN Bobette Mo, MD  amLODipine (NORVASC) tablet 5 mg  5 mg Oral Daily Bobette Mo, MD   5 mg at 05/30/22 1106   benazepril (LOTENSIN) tablet 20 mg  20 mg Oral q morning Bobette Mo, MD   20 mg at 05/30/22 1105   bisacodyl (DULCOLAX) EC tablet 5 mg  5 mg Oral Daily PRN Bobette Mo, MD       cephALEXin [facility]) capsule 500 mg  500 mg Oral  Q8H Burnadette Pop, MD   500 mg at 05/30/22 1610   cholecalciferol (VITAMIN D3) 25 MCG (1000 UNIT) tablet 2,000 Units  2,000 Units Oral Daily Bobette Mo, MD   2,000 Units at 05/30/22 1106   cyanocobalamin (VITAMIN B12) tablet 1,000 mcg  1,000 mcg Oral Daily Bobette Mo, MD   1,000 mcg at 05/30/22 1106   fentaNYL (DURAGESIC) 50 MCG/HR 1 patch  1 patch Transdermal Q72H Bobette Mo, MD   1 patch at 05/29/22 1921   levothyroxine (SYNTHROID) tablet 125 mcg  125 mcg Oral Q0600 Bobette Mo, MD   125 mcg at 05/30/22 9604   LORazepam (ATIVAN) tablet 0.5 mg  0.5 mg Oral BID Bobette Mo, MD   0.5 mg at 05/30/22 1106   methocarbamol (ROBAXIN) tablet 500 mg  500 mg Oral BID PRN Bobette Mo, MD       ondansetron [facility]) tablet 4 mg  4 mg Oral Q6H PRN Bobette Mo, MD       Or   ondansetron [facility]) injection 4 mg  4 mg Intravenous Q6H PRN Bobette Mo, MD       Oral care mouth rinse  15 mL Mouth Rinse PRN Willeen Niece, MD       Oral care mouth rinse  15 mL Mouth Rinse PRN Willeen Niece, MD       oxyCODONE-acetaminophen (PERCOCET/ROXICET) 5-325 MG per tablet 1 tablet  1 tablet Oral Q6H PRN Bobette Mo, MD   1 tablet at 05/30/22 5409   And   oxyCODONE (Oxy IR/ROXICODONE) immediate release tablet 5 mg  5 mg Oral Q6H PRN Bobette Mo, MD   5 mg at 05/30/22 0803   pantoprazole (PROTONIX) EC tablet 40 mg  40 mg Oral Daily Bobette Mo, MD   40 mg at 05/30/22 1106   polyethylene glycol (MIRALAX / GLYCOLAX) packet 17 g  17 g Oral Daily Burnadette Pop, MD   17 g at 05/30/22 1106   predniSONE (DELTASONE) tablet 5 mg  5 mg Oral Daily Crosley, Debby, MD   5 mg at 05/30/22 1106   senna-docusate (Senokot-S) tablet 1 tablet  1 tablet Oral BID Burnadette Pop, MD   1 tablet at 05/30/22 1105   sertraline (ZOLOFT) tablet 50 mg  50 mg Oral Daily Bobette Mo, MD   50 mg at 05/30/22 1106     Discharge Medications: Please see  discharge summary for a list of discharge medications.  Relevant Imaging Results:  Relevant Lab Results:   Additional Information SSN [id number]  Beckie Busing, RN

## 2022-05-30 NOTE — Progress Notes (Signed)
Mobility Specialist - Progress Note   05/30/22 1334  Mobility  Activity Transferred from bed to chair  Level of Assistance Minimal assist, patient does 75% or more  Assistive Device Front wheel walker  Distance Ambulated (ft) 10 ft  Range of Motion/Exercises Active  Activity Response Tolerated well  Mobility Referral Yes  $Mobility charge 1 Mobility  Mobility Specialist Start Time (ACUTE ONLY) 1327  Mobility Specialist Stop Time (ACUTE ONLY) 1335  Mobility Specialist Time Calculation (min) (ACUTE ONLY) 8 min   Pt received in bed and agreed to transfer. Mod A for bed mobility. Min A for STS, small steps to recliner and left with all needs met and alarm on.  Marilynne Halsted Mobility Specialist

## 2022-05-31 DIAGNOSIS — L03115 Cellulitis of right lower limb: Secondary | ICD-10-CM | POA: Diagnosis not present

## 2022-05-31 LAB — HEMOGLOBIN AND HEMATOCRIT, BLOOD
HCT: 32.6 % — ABNORMAL LOW (ref 36.0–46.0)
Hemoglobin: 9.6 g/dL — ABNORMAL LOW (ref 12.0–15.0)

## 2022-05-31 MED ORDER — CEPHALEXIN 500 MG PO CAPS: 500.0000 mg | ORAL_CAPSULE | Freq: Three times a day (TID) | ORAL | 0 refills | Status: DC

## 2022-05-31 NOTE — Discharge Summary (Signed)
Physician Discharge Summary  Jocelyn Bauer WGN:[id number] DOB: 1928/01/12 DOA: 05/26/2022  PCP: Rodrigo Ran, MD  Admit date: 05/26/2022  Discharge date: 06/04/22  Admitted From: Home  Disposition:Skilled Nursing Facility  Recommendations for Outpatient Follow-up:  Follow up with PCP in 1-2 weeks. Please obtain BMP/CBC in one week.  Home Health: None Equipment/Devices: None  Discharge Condition: Stable CODE STATUS: DNR Diet recommendation: Heart Healthy   Brief Summary/ Hospital Course: This 89 year old female with history of anxiety, osteoarthritis, osteoporosis, rheumatoid arthritis, bilateral ankle fractures, hyperlipidemia, hypertension, hypothyroidism who presented in the ED with complaints of abdominal pain, right lower extremity edema, erythema. On presentation she was afebrile, normotensive. Labs showed WBC count of 7.8. Chest x-ray did not show any acute disease. CT of abdomen/pelvis did not show any acute findings but showed small non- obstructing bilateral calculi, compression fraction of inferior endplate of L4. Patient was admitted for the management of cellulitis of right lower extremity, clinically improved after starting on antibiotics. PT recommending home health but daughter is requesting for SNF.  Patient completed the course of antibiotics for cellulitis which has resolved.  PT and OT recommended skilled nursing facility.  Patient is being discharged to SNF.  Discharge Diagnoses:  Principal Problem:   Cellulitis of right lower extremity Active Problems:   Hypertension   Hypothyroidism   Hyperlipidemia   GERD (gastroesophageal reflux disease)   Anxiety   Chronic neck and back pain   RA (rheumatoid arthritis) (HCC)   Normocytic anemia  Right lower extremity cellulitis:  > Resolved. She presented with right lower extremity pain, and swelling.   She is started on ceftriaxone.  Currently she is afebrile, no leucocytosis. Looks much better today.  Completed Keflex  for 5 more days. Doppler of the right lower extremity negative for DVT.   Abdominal pain:  Unclear etiology.  CT abdomen /pelvis did not show any acute findings.   Abdomen is benign on examination, abdomen pain has resolved   Hypertension:  Continue amlodipine, benazepril.   Monitor blood pressure   Hypothyroidism:  Continue Synthroid   Hypokalemia:  Replaced. Resolved.   Hyperlipidemia:  Intolerant to statin.   GERD: Continue PPI   Anxiety:  Continue sertraline, lorazepam.   Chronic neck/back pain:  Continue Duragesic patch and  oxycodone.   CT imaging showed compression fraction of inferior endplate of L4.   Continue supportive care, pain management   Rheumatoid  arthritis:  Continue  methotrexate.  Follows with rheumatology   Chronic hypoxic Respiratory failure:  On 2 L of oxygen at home chronically.     Lung nodule:  A 5 mm left lung base nodule, present on the prior CT. Not sure that a follow-up on this will be helpful due to her age.   Normocytic anemia: Currently hemoglobin stable.  No signs of any visible bleeding.   Deconditioning/debility:  Patient lives with her daughter and ambulates with the help of walker. She has hard time doing ADLs at home. PT recommended home health on discharge but daughter is requesting SNF.  TOC made aware. Repeat PT evaluation recommended SNF.  Discharge Instructions  Discharge Instructions     Call MD for:  difficulty breathing, headache or visual disturbances   Complete by: As directed    Call MD for:  persistant dizziness or light-headedness   Complete by: As directed    Call MD for:  persistant nausea and vomiting   Complete by: As directed    Diet - low sodium heart healthy   Complete  by: As directed    Diet - low sodium heart healthy   Complete by: As directed    Diet Carb Modified   Complete by: As directed    Diet Carb Modified   Complete by: As directed    Discharge instructions   Complete by: As directed     Advised to follow-up with primary care physician in 1 week. Advised to continue Keflex 500 mg every 8 hours for next 3 days to complete 5-day treatment for UTI.   Increase activity slowly   Complete by: As directed       Allergies as of 06/04/2022       Reactions   Contrast Media [iodinated Contrast Media] Other (See Comments)   Itching.( 13 hr pre medication regimen worked fine. )   Tdap [tetanus-diphth-acell Pertussis] Hives   Cholestyramine    Other reaction(s): stomach felt too full.   Duloxetine Hcl    Other reaction(s): didn't sleep  but just took one pill   Etidronate    Other reaction(s): diarrhea   Ezetimibe-simvastatin    Other reaction(s): Unknown   Hydromorphone    Other reaction(s): Unknown   Pravastatin    Other reaction(s): with daily dose just hurt more.   Streptococcus (diplococcus) Pneumoniae [streptococci] Other (See Comments)   Shortness of breath        Medication List     STOP taking these medications    acyclovir ointment 5 % Commonly known as: ZOVIRAX       TAKE these medications    acetaminophen 325 MG tablet Commonly known as: TYLENOL Take 2 tablets (650 mg total) by mouth 3 (three) times daily.   albuterol (2.5 MG/3ML) 0.083% nebulizer solution Commonly known as: PROVENTIL Take 3 mLs (2.5 mg total) by nebulization every 2 (two) hours as needed for wheezing.   amLODipine 5 MG tablet Commonly known as: NORVASC Take 5 mg by mouth daily.   aspirin EC 81 MG tablet Take 1 tablet (81 mg total) by mouth daily. Swallow whole.   benazepril 20 MG tablet Commonly known as: LOTENSIN Take 20 mg by mouth every morning.   bisacodyl 5 MG EC tablet Commonly known as: DULCOLAX Take 1 tablet (5 mg total) by mouth daily as needed for moderate constipation.   cholecalciferol 25 MCG (1000 UNIT) tablet Commonly known as: VITAMIN D3 Take 2,000 Units by mouth daily.   cyanocobalamin 1000 MCG tablet Commonly known as: VITAMIN B12 Take 1,000  mcg by mouth daily.   denosumab 60 MG/ML Sosy injection Commonly known as: PROLIA Inject 60 mg into the skin every 6 (six) months.   fentaNYL 50 MCG/HR Commonly known as: DURAGESIC Place onto the skin every 3 (three) days.   guaiFENesin 600 MG 12 hr tablet Commonly known as: MUCINEX Take 1 tablet (600 mg total) by mouth 2 (two) times daily as needed for cough.   levothyroxine 125 MCG tablet Commonly known as: SYNTHROID Take 125 mcg by mouth daily before breakfast.   LORazepam 0.5 MG tablet Commonly known as: ATIVAN Take 0.5 mg by mouth 2 (two) times daily.   methocarbamol 500 MG tablet Commonly known as: ROBAXIN Take 500 mg by mouth 2 (two) times daily as needed for muscle spasms.   methotrexate 2.5 MG tablet Commonly known as: RHEUMATREX Take 15 mg by mouth every Sunday.   omeprazole 20 MG capsule Commonly known as: PRILOSEC Take 20 mg by mouth daily.   oxyCODONE-acetaminophen 10-325 MG tablet Commonly known as: PERCOCET Take 1 tablet by  mouth every 6 (six) hours as needed for pain.   polyethylene glycol 17 g packet Commonly known as: MIRALAX / GLYCOLAX Take 17 g by mouth daily as needed for mild constipation.   potassium chloride 10 MEQ tablet Commonly known as: KLOR-CON Take 20 mEq by mouth daily.   predniSONE 5 MG tablet Commonly known as: DELTASONE Take 1 tablet (5 mg total) by mouth daily.   sertraline 50 MG tablet Commonly known as: ZOLOFT Take 50 mg by mouth daily.        Follow-up Information     Rodrigo Ran, MD Follow up in 1 week(s).   Specialty: Internal Medicine Contact information: [address] [place] [address] [phone]                Allergies  Allergen Reactions   Contrast Media [Iodinated Contrast Media] Other (See Comments)    Itching.( 13 hr pre medication regimen worked fine. )   Tdap [Tetanus-Diphth-Acell Pertussis] Hives   Cholestyramine     Other reaction(s): stomach felt too full.   Duloxetine Hcl      Other reaction(s): didn't sleep  but just took one pill   Etidronate     Other reaction(s): diarrhea   Ezetimibe-Simvastatin     Other reaction(s): Unknown   Hydromorphone     Other reaction(s): Unknown   Pravastatin     Other reaction(s): with daily dose just hurt more.   Streptococcus (Diplococcus) Pneumoniae [Streptococci] Other (See Comments)    Shortness of breath    Consultations: None   Procedures/Studies: VAS [place] LOWER EXTREMITY VENOUS (DVT)  Result Date: 05/27/2022  Lower Venous DVT Study Patient Name:  Jocelyn Bauer  Date of Exam:   05/27/2022 Medical Rec #: [id number]        Accession #:    [id number] Date of Birth: 1928-05-02         Patient Gender: F Patient Age:   43 years Exam Location:  [facility] Procedure:      VAS [place] LOWER EXTREMITY VENOUS (DVT) Referring Phys: AMRIT ADHIKARI --------------------------------------------------------------------------------  Indications: Edema.  Limitations: Body habitus, poor ultrasound/tissue interface and patient inability to position properly. Comparison Study: No previous exams Performing Technologist: Jody Hill RVT, RDMS  Examination Guidelines: A complete evaluation includes B-mode imaging, spectral Doppler, color Doppler, and power Doppler as needed of all accessible portions of each vessel. Bilateral testing is considered an integral part of a complete examination. Limited examinations for reoccurring indications may be performed as noted. The reflux portion of the exam is performed with the patient in reverse Trendelenburg.  +---------+---------------+---------+-----------+----------+--------------+ RIGHT    CompressibilityPhasicitySpontaneityPropertiesThrombus Aging +---------+---------------+---------+-----------+----------+--------------+ CFV      Full           Yes      Yes                                 +---------+---------------+---------+-----------+----------+--------------+ SFJ      Full                                                         +---------+---------------+---------+-----------+----------+--------------+ FV Prox  Full           Yes      Yes                                 +---------+---------------+---------+-----------+----------+--------------+  FV Mid   Full           Yes      Yes                                 +---------+---------------+---------+-----------+----------+--------------+ FV DistalFull           Yes      Yes                                 +---------+---------------+---------+-----------+----------+--------------+ PFV      Full                                                        +---------+---------------+---------+-----------+----------+--------------+ POP      Full           Yes      Yes                                 +---------+---------------+---------+-----------+----------+--------------+ PTV                                                   Not seen       +---------+---------------+---------+-----------+----------+--------------+ PERO                                                  Not seen       +---------+---------------+---------+-----------+----------+--------------+   Right Technical Findings: Not visualized segments include peroneal and posterior tibial veins.  +----+---------------+---------+-----------+----------+--------------+ LEFTCompressibilityPhasicitySpontaneityPropertiesThrombus Aging +----+---------------+---------+-----------+----------+--------------+ CFV Full           Yes      Yes                                 +----+---------------+---------+-----------+----------+--------------+    Summary: RIGHT: - There is no evidence of deep vein thrombosis in the lower extremity. However, portions of this examination were limited- see technologist comments above.  - No cystic structure found in the popliteal fossa.  LEFT: - No evidence of common femoral vein obstruction.  *See table(s) above for  measurements and observations. Electronically signed by Sherald Hess MD on 05/27/2022 at 4:34:47 PM.    Final    DG Chest Port 1 View  Result Date: 05/26/2022 CLINICAL DATA:  Weakness, peripheral edema EXAM: PORTABLE CHEST 1 VIEW COMPARISON:  11/02/2021 FINDINGS: Minimal left basilar parenchymal scarring. Lungs are otherwise clear. No pneumothorax or pleural effusion. Cardiac size within normal limits. Pulmonary vascularity is normal. No acute bone abnormality. IMPRESSION: 1. No active disease. Electronically Signed   By: Helyn Numbers M.D.   On: 05/26/2022 02:33   CT ABDOMEN PELVIS WO CONTRAST  Result Date: 05/26/2022 CLINICAL DATA:  Abdominal pain. EXAM: CT ABDOMEN AND PELVIS WITHOUT CONTRAST TECHNIQUE: Multidetector CT imaging of the abdomen and pelvis was performed following the standard  protocol without IV contrast. RADIATION DOSE REDUCTION: This exam was performed according to the departmental dose-optimization program which includes automated exposure control, adjustment of the mA and/or kV according to patient size and/or use of iterative reconstruction technique. COMPARISON:  CT abdomen pelvis dated 11/02/2021. FINDINGS: Evaluation of this exam is limited in the absence of intravenous contrast. Evaluation is also limited due to respiratory motion and streak artifact caused by patient's arms. Lower chest: A 5 mm left lung base nodule, present on the prior CT. There is coronary vascular calcification. No intra-abdominal free air or free fluid. Hepatobiliary: The liver is unremarkable. No biliary ductal dilatation. The gallbladder is unremarkable. Pancreas: Unremarkable. No pancreatic ductal dilatation or surrounding inflammatory changes. Spleen: Normal in size without focal abnormality. Adrenals/Urinary Tract: The adrenal glands are unremarkable. Small nonobstructing bilateral renal calculi. There is no hydronephrosis or obstructing stone. The visualized ureters and urinary bladder appear  unremarkable. Stomach/Bowel: There is no bowel obstruction or active inflammation. Small hiatal hernia. The appendix is not visualized with certainty. No inflammatory changes identified in the right lower quadrant. Vascular/Lymphatic: Moderate aortoiliac atherosclerotic disease. The IVC is unremarkable. No portal venous gas. There is no adenopathy. Reproductive: The uterus is grossly unremarkable. No adnexal masses. Other: None Musculoskeletal: Osteopenia with degenerative changes of the spine. Total left hip arthroplasty. Compression fracture of inferior endplate of L4, progressed since the prior CT, age indeterminate. Correlation with clinical exam and point tenderness recommended. IMPRESSION: 1. No bowel obstruction. 2. Small nonobstructing bilateral renal calculi. No hydronephrosis or obstructing stone. 3. Compression fracture of inferior endplate of L4, progressed since the prior CT, age indeterminate. Correlation with clinical exam and point tenderness recommended. 4. A 5 mm left lung base nodule, present on the prior CT. 5.  Aortic Atherosclerosis (ICD10-I70.0). Electronically Signed   By: Elgie Collard M.D.   On: 05/26/2022 02:30     Subjective: Patient was seen and examined at bedside.  Overnight events noted.   Patient report doing much better and wants to be discharged.  Patient being discharged to SNF.  Discharge Exam: Vitals:   06/03/22 2100 06/04/22 0616  BP: 109/64 126/64  Pulse: 79 71  Resp: 18 17  Temp: 98.3 F (36.8 C) 98.2 F (36.8 C)  SpO2: 100% 100%   Vitals:   06/03/22 0502 06/03/22 1433 06/03/22 2100 06/04/22 0616  BP: 122/67 (!) 103/53 109/64 126/64  Pulse: 76 89 79 71  Resp: 14 16 18 17   Temp: 98.6 F (37 C) 98.5 F (36.9 C) 98.3 F (36.8 C) 98.2 F (36.8 C)  TempSrc: Oral Oral Oral Oral  SpO2: 100% 99% 100% 100%  Weight:      Height:        General: Pt is alert, awake, not in acute distress Cardiovascular: RRR, S1/S2 +, no rubs, no gallops Respiratory:  CTA bilaterally, no wheezing, no rhonchi Abdominal: Soft, NT, ND, bowel sounds + Extremities: no edema, no cyanosis    The results of significant diagnostics from this hospitalization (including imaging, microbiology, ancillary and laboratory) are listed below for reference.     Microbiology: No results found for this or any previous visit (from the past 240 hour(s)).   Labs: BNP (last 3 results) Recent Labs    05/26/22 0020  BNP 49.7   Basic Metabolic Panel: Recent Labs  Lab 05/29/22 0609  NA 137  K 3.7  CL 103  CO2 27  GLUCOSE 100*  BUN 13  CREATININE 0.79  CALCIUM 8.4*   Liver Function Tests:  No results for input(s): "AST", "ALT", "ALKPHOS", "BILITOT", "PROT", "ALBUMIN" in the last 168 hours.  No results for input(s): "LIPASE", "AMYLASE" in the last 168 hours.  No results for input(s): "AMMONIA" in the last 168 hours. CBC: Recent Labs  Lab 05/31/22 0551  HGB 9.6*  HCT 32.6*   Cardiac Enzymes: No results for input(s): "CKTOTAL", "CKMB", "CKMBINDEX", "TROPONINI" in the last 168 hours. BNP: Invalid input(s): "POCBNP" CBG: No results for input(s): "GLUCAP" in the last 168 hours. D-Dimer No results for input(s): "DDIMER" in the last 72 hours. Hgb A1c No results for input(s): "HGBA1C" in the last 72 hours. Lipid Profile No results for input(s): "CHOL", "HDL", "LDLCALC", "TRIG", "CHOLHDL", "LDLDIRECT" in the last 72 hours. Thyroid function studies No results for input(s): "TSH", "T4TOTAL", "T3FREE", "THYROIDAB" in the last 72 hours.  Invalid input(s): "FREET3" Anemia work up No results for input(s): "VITAMINB12", "FOLATE", "FERRITIN", "TIBC", "IRON", "RETICCTPCT" in the last 72 hours. Urinalysis    Component Value Date/Time   COLORURINE YELLOW 05/26/2022 1209   APPEARANCEUR HAZY (A) 05/26/2022 1209   LABSPEC 1.015 05/26/2022 1209   PHURINE 7.0 05/26/2022 1209   GLUCOSEU NEGATIVE 05/26/2022 1209   HGBUR NEGATIVE 05/26/2022 1209   BILIRUBINUR NEGATIVE  05/26/2022 1209   KETONESUR NEGATIVE 05/26/2022 1209   PROTEINUR NEGATIVE 05/26/2022 1209   UROBILINOGEN 0.2 10/31/2014 1240   NITRITE NEGATIVE 05/26/2022 1209   LEUKOCYTESUR TRACE (A) 05/26/2022 1209   Sepsis Labs No results for input(s): "WBC" in the last 168 hours.  Invalid input(s): "PROCALCITONIN", "LACTICIDVEN"  Microbiology No results found for this or any previous visit (from the past 240 hour(s)).   Time coordinating discharge: Over 30 minutes  SIGNED:   Willeen Niece, MD  [facility] 06/04/2022, 11:37 AM Pager

## 2022-05-31 NOTE — TOC Progression Note (Addendum)
Transition of Care [facility]) - Progression Note    Patient Details  Name: Jocelyn Bauer MRN: [id number] Date of Birth: 1928-11-11  [facility] CM/SW Contact  Beckie Busing, RN Phone Number:[phone]  05/31/2022, 11:54 AM  Clinical Narrative:    CM has sent message to grace Ann admissions director for [facility] to determine if bed is available. Awaiting a response.   1200 [facility] does not have bed available. CM at bedside to update patient and daughter. CM has reviewed current bed offers. Patient states that she wants to go home. Daughter explaining to patient that she will need rehab prior to going home to build her strength. Patient states that she has the final say. Daughter states that she has called family to help make decision. Family will update CM once they make final decision.  1459 Both daughter at bedside requesting to speak with the CM. CM at bedside, daughters states that their second choice would be [facility] . CM made family aware that [facility] can not accept patient before Tuesday. Family has been updated. Family has decided to move forward with placement at [facility] because they will have a bed available on Tuesday.  Message has been sent to MD to make him aware.     Expected Discharge Plan and Services                                               Social Determinants of Health (SDOH) Interventions SDOH Screenings   Food Insecurity: No Food Insecurity (05/26/2022)  Housing: Low Risk  (05/26/2022)  Transportation Needs: No Transportation Needs (05/26/2022)  Utilities: Not At Risk (05/26/2022)  Depression (PHQ2-9): Low Risk  (02/05/2021)  Tobacco Use: Medium Risk (05/26/2022)    Readmission Risk Interventions     No data to display

## 2022-05-31 NOTE — Progress Notes (Signed)
Occupational Therapy Treatment Patient Details Name: Jocelyn Bauer MRN: [id number] DOB: Nov 01, 1928 Today's Date: 05/31/2022   History of present illness 89 yo female admitted to hospital on 05/26/2022 due to radiating RLQ abdominal pain, RLE edema, weeping, rubor and pain. Imaging completed including CXR and CT of abdomen/pelvis negative for acute findings, with noted known L4 compression fx. Abn lab findings. Pt was found to have pain associated with R LE cellulitis impacting pts safety and I with functional mobility tasks. Pt requires 2 L/min supplemental O2 at baseline. Pt has PMH including but not limited to: RA, B ankle fx s/p ORIF, chronic neck and LBP, HDL, HTN,  B total elbow replacement, and L THA .   OT comments  The pt was seen for ADL participation, functional strengthening, and for assistance for out of bed. She required increased assist to manage lower body dressing, as well as for peri-hygiene. She was noted to be with intermittent unsteadiness in dynamic standing, and she also reported having chronic generalized pain secondary to rheumatoid arthritis. Her daughter was present throughout and expressed concerns about the pt being able to return home at her current assist level, therefore she is currently looking into short-term SNF rehab. Continue OT plan of care to maximize the pt's independence with self care tasks.    Recommendations for follow up therapy are one component of a multi-disciplinary discharge planning process, led by the attending physician.  Recommendations may be updated based on patient status, additional functional criteria and insurance authorization.    Assistance Recommended at Discharge Frequent or constant Supervision/Assistance  Patient can return home with the following  A little help with walking and/or transfers;A lot of help with bathing/dressing/bathroom;Assistance with cooking/housework;Assist for transportation;Help with stairs or ramp for entrance    Equipment Recommendations  None recommended by OT       Precautions / Restrictions Precautions Precautions: Fall Restrictions Weight Bearing Restrictions: No       Mobility Bed Mobility Overal bed mobility: Needs Assistance Bed Mobility: Supine to Sit     Supine to sit: Min guard          Transfers Overall transfer level: Needs assistance Equipment used: Rolling walker (2 wheels) Transfers: Sit to/from Stand Sit to Stand: Min assist Stand pivot transfers: Min assist         General transfer comment: She required intermittent cues for RW placement & stepping sequence         ADL either performed or assessed with clinical judgement   ADL Overall ADL's : Needs assistance/impaired Eating/Feeding: Set up;Sitting Eating/Feeding Details (indicate cue type and reason): pt required assist to open packets and remove lids, given significant arthritic changes of both hands             Upper Body Dressing : Minimal assistance Upper Body Dressing Details (indicate cue type and reason): She doffed a hospital gown, then donned another clean one in sitting at chair level. Lower Body Dressing: Maximal assistance;Sit to/from stand Lower Body Dressing Details (indicate cue type and reason): She required assist to don a pair of underwear. Underwear were donned over her ankles and up to her knees in sitting, then over her hips in standing. She required use of a RW in standing for support, given impaired standing balance.     Toileting- Clothing Manipulation and Hygiene: Maximal assistance;Sit to/from stand Toileting - Clothing Manipulation Details (indicate cue type and reason): The pt required assist to perform anterior and posterior perii-hygiene in standing. She presented with  unsteadiness in standing, requiring heavy reliance on RW for added supoort in standing.              Cognition Arousal/Alertness: Awake/alert Behavior During Therapy: WFL for tasks  assessed/performed Overall Cognitive Status: Within Functional Limits for tasks assessed                            Pertinent Vitals/ Pain       Pain Assessment Pain Location: She reported having chronic generalized pain from rheumatoid arthritis. Pain Intervention(s): Limited activity within patient's tolerance, Other (comment) (She reported receiving pain medication.)         Frequency  Min 1X/week        Progress Toward Goals  OT Goals(current goals can now be found in the care plan section)  Progress towards OT goals: Progressing toward goals  Acute Rehab OT Goals Patient Stated Goal: to get stronger OT Goal Formulation: With patient Time For Goal Achievement: 06/10/22 Potential to Achieve Goals: Good  Plan Discharge plan remains appropriate       AM-PAC OT "6 Clicks" Daily Activity     Outcome Measure   Help from another person eating meals?: None Help from another person taking care of personal grooming?: A Little Help from another person toileting, which includes using toliet, bedpan, or urinal?: A Lot Help from another person bathing (including washing, rinsing, drying)?: A Lot Help from another person to put on and taking off regular upper body clothing?: A Little Help from another person to put on and taking off regular lower body clothing?: A Lot 6 Click Score: 16    End of Session Equipment Utilized During Treatment: Gait belt;Rolling walker (2 wheels)  OT Visit Diagnosis: Unsteadiness on feet (R26.81);Pain   Activity Tolerance Other (comment) (Fair+ tolerance)   Patient Left in chair;with call bell/phone within reach;with chair alarm set;with family/visitor present   Nurse Communication Mobility status        Time: 5284-1324 OT Time Calculation (min): 27 min  Charges: OT General Charges $OT Visit: 1 Visit OT Treatments $Self Care/Home Management : 8-22 mins $Therapeutic Activity: 8-22 mins     Reuben Likes, OTR/L 05/31/2022,  12:32 PM

## 2022-05-31 NOTE — Discharge Instructions (Signed)
Advised to continue Keflex 500 mg every 8 hours for next 3 days to complete 5-day treatment for Cellulitis

## 2022-05-31 NOTE — Progress Notes (Signed)
Physical Therapy Treatment Patient Details Name: Jocelyn Bauer MRN: [id number] DOB: 1928/06/12 Today's Date: 05/31/2022   History of Present Illness 89 yo female admitted to hospital on 05/26/2022 due to radiating RLQ abdominal pain, RLE edema, weeping, rubor and pain. Imaging completed including CXR and CT of abdomen/pelvis negative for acute findings, with noted known L4 compression fx. Abn lab findings. Pt was found to have pain associated with R LE cellulitis impacting pts safety and I with functional mobility tasks. Pt requires 2 L/min supplemental O2 at baseline. Pt has PMH including but not limited to: RA, B ankle fx s/p ORIF, chronic neck and LBP, HDL, HTN,  B total elbow replacement, and L THA .    PT Comments    Pt assisted to recliner and then performed a couple sit to stands for technique and marching in place until fatigued.  Pt currently min assist and requiring cues for technique and safety.  Pt also performed a few LE exercises in recliner.  Pt would benefit from post acute rehab upon d/c.    Recommendations for follow up therapy are one component of a multi-disciplinary discharge planning process, led by the attending physician.  Recommendations may be updated based on patient status, additional functional criteria and insurance authorization.  Follow Up Recommendations       Assistance Recommended at Discharge Frequent or constant Supervision/Assistance  Patient can return home with the following Assistance with cooking/housework;Direct supervision/assist for medications management;Direct supervision/assist for financial management;Assist for transportation;Help with stairs or ramp for entrance;A little help with walking and/or transfers;A little help with bathing/dressing/bathroom   Equipment Recommendations  None recommended by PT    Recommendations for Other Services       Precautions / Restrictions Precautions Precautions: Fall Precaution Comments: chronic 2L O2      Mobility  Bed Mobility Overal bed mobility: Needs Assistance Bed Mobility: Supine to Sit     Supine to sit: Min guard     General bed mobility comments: increased time and effort    Transfers Overall transfer level: Needs assistance Equipment used: Rolling walker (2 wheels) Transfers: Sit to/from Stand Sit to Stand: Min assist   Step pivot transfers: Min assist       General transfer comment: verbal cues for hand placement and weight shifting, performed sit to stands twice more from recliner for technique and marching in place, 45 sec and then 20 sec as pt fatigued quickly; remained on pt's baseline 2L O2 [place] during session    Ambulation/Gait                   Stairs             Wheelchair Mobility    Modified Rankin (Stroke Patients Only)       Balance                                            Cognition Arousal/Alertness: Awake/alert Behavior During Therapy: WFL for tasks assessed/performed Overall Cognitive Status: Within Functional Limits for tasks assessed                                          Exercises General Exercises - Lower Extremity Long Arc Quad: AROM, Both, 15 reps, Seated Heel Slides: AROM, Both, 10  reps, Supine Hip ABduction/ADduction: AROM, Supine, Both, 10 reps Hip Flexion/Marching: AROM, 10 reps, Both, Seated    General Comments        Pertinent Vitals/Pain Pain Assessment Pain Assessment: Faces Faces Pain Scale: Hurts little more Pain Location: back pain Pain Descriptors / Indicators: Aching Pain Intervention(s): Repositioned, Monitored during session    Home Living                          Prior Function            PT Goals (current goals can now be found in the care plan section) Progress towards PT goals: Progressing toward goals    Frequency    Min 1X/week      PT Plan Current plan remains appropriate    Co-evaluation               AM-PAC PT "6 Clicks" Mobility   Outcome Measure  Help needed turning from your back to your side while in a flat bed without using bedrails?: A Little Help needed moving from lying on your back to sitting on the side of a flat bed without using bedrails?: A Little Help needed moving to and from a bed to a chair (including a wheelchair)?: A Little Help needed standing up from a chair using your arms (e.g., wheelchair or bedside chair)?: A Little Help needed to walk in hospital room?: A Lot Help needed climbing 3-5 steps with a railing? : Total 6 Click Score: 15    End of Session Equipment Utilized During Treatment: Gait belt Activity Tolerance: Patient tolerated treatment well Patient left: with call bell/phone within reach;with chair alarm set;with family/visitor present;in chair   PT Visit Diagnosis: Muscle weakness (generalized) (M62.81);Unsteadiness on feet (R26.81);Other abnormalities of gait and mobility (R26.89)     Time: 1610-9604 PT Time Calculation (min) (ACUTE ONLY): 15 min  Charges:  $Gait Training: 8-22 mins                     Paulino Door, DPT Physical Therapist Acute Rehabilitation Services Office: [phone]    Jocelyn Bauer 05/31/2022, 4:07 PM

## 2022-05-31 NOTE — Care Management Important Message (Signed)
Important Message  Patient Details IM Letter given Name: JALEISA BHAT MRN: [id number] Date of Birth: 1928/04/27   Medicare Important Message Given:  Yes     Caren Macadam 05/31/2022, 11:10 AM

## 2022-05-31 NOTE — Progress Notes (Signed)
PROGRESS NOTE    Jocelyn Bauer  ZOX:[id number] DOB: Oct 03, 1928 DOA: 05/26/2022 PCP: Rodrigo Ran, MD   Brief Narrative:  This 89 year old female with history of anxiety, osteoarthritis, osteoporosis, rheumatoid arthritis, bilateral ankle fractures, hyperlipidemia, hypertension, hypothyroidism who presented  in the  ED with complaints of abdominal pain, right lower extremity edema, erythema.  On presentation she was afebrile, normotensive.  Labs showed WBC count of 7.8.  Chest x-ray did not show any acute disease. CT of abdomen/pelvis did not show any acute findings but showed small non- obstructing bilateral calculi, compression fraction of inferior endplate of L4.  Patient was admitted for the management of cellulitis of right lower extremity, clinically improved after starting on antibiotics.  PT recommending home health but daughter is requesting for SNF.  PT reevaluation pending.  Medically stable for discharge after disposition is sorted out.  Assessment & Plan:   Principal Problem:   Cellulitis of right lower extremity Active Problems:   Hypertension   Hypothyroidism   Hyperlipidemia   GERD (gastroesophageal reflux disease)   Anxiety   Chronic neck and back pain   RA (rheumatoid arthritis) (HCC)   Normocytic anemia  Right lower extremity cellulitis:  She presented with right lower extremity pain, and swelling.   She is started on ceftriaxone.  Currently she is afebrile, no leucocytosis. Looks much better now.  Antibiotics changed to oral keflex for 5 days. Doppler of the right lower extremity negative for DVT.   Abdominal pain:  Unclear etiology.  CT abdomen /pelvis did not show any acute findings.   Abdomen is benign on examination, abdomen pain has resolved.   Hypertension:  Continue amlodipine, benazepril.   Monitor blood pressure.   Hypothyroidism:  Continue Synthroid.   Hypokalemia:  Replaced. Resolved.   Hyperlipidemia:  Intolerant to statin.   GERD:  Continue PPI   Anxiety:  Continue sertraline, lorazepam.   Chronic neck/back pain:  Continue Duragesic patch and  oxycodone.   CT imaging showed compression fraction of inferior endplate of L4.   Continue supportive care, pain management   Rheumatoid  arthritis:  Continue methotrexate.  Follows with rheumatology   Chronic hypoxic Respiratory failure:  On 2 L of oxygen at home chronically.    Lung nodule:  A 5 mm left lung base nodule, present on the prior CT. Not sure that a follow-up on this will be helpful due to her age.   Normocytic anemia: Currently hemoglobin stable.  No signs of any visible bleeding.   Deconditioning/debility:  Patient lives with her daughter and ambulates with the help of walker. She has hard time doing ADLs at home. PT recommended home health on discharge but daughter is requesting SNF.  TOC made aware. Repeat PT evaluation recommended SNF.   DVT prophylaxis: SCDs Code Status: DNR Family Communication: Daughter at bed side. Disposition Plan:     Status is: Inpatient Remains inpatient appropriate because: Admitted for right lower extremity cellulitis which has been improved with IV antibiotics.  Patient is very deconditioned and needs skilled nursing facility.  Awaiting insurance authorization.    Consultants:  None  Procedures: None  Antimicrobials: Anti-infectives (From admission, onward)    Start     Dose/Rate Route Frequency Ordered Stop   05/31/22 0000  cephALEXin (KEFLEX) 500 MG capsule        500 mg Oral Every 8 hours 05/31/22 1102 06/03/22 2359   05/29/22 0600  cephALEXin (KEFLEX) capsule 500 mg        500 mg Oral Every  8 hours 05/28/22 1228 06/03/22 0559   05/28/22 1000  cefTRIAXone (ROCEPHIN) 2 g in sodium chloride 0.9 % 100 mL IVPB  Status:  Discontinued        2 g 200 mL/hr over 30 Minutes Intravenous Every 24 hours 05/27/22 1118 05/27/22 1119   05/28/22 1000  cefTRIAXone (ROCEPHIN) 2 g in sodium chloride 0.9 % 100 mL IVPB   Status:  Discontinued        2 g 200 mL/hr over 30 Minutes Intravenous Every 24 hours 05/27/22 1119 05/28/22 1228   05/27/22 1215  cefTRIAXone (ROCEPHIN) 1 g in sodium chloride 0.9 % 100 mL IVPB        1 g 200 mL/hr over 30 Minutes Intravenous Every 24 hours 05/27/22 1119 05/27/22 1319   05/26/22 1000  cefTRIAXone (ROCEPHIN) 1 g in sodium chloride 0.9 % 100 mL IVPB  Status:  Discontinued        1 g 200 mL/hr over 30 Minutes Intravenous Every 24 hours 05/26/22 0853 05/27/22 1118   05/26/22 0200  ceFAZolin (ANCEF) IVPB 1 g/50 mL premix        1 g 100 mL/hr over 30 Minutes Intravenous  Once 05/26/22 0157 05/26/22 0704      Subjective: She was seen and examined at bedside.  Overnight events noted. Patient reports feeling very much improved.  Still has some weakness but improving. Patient is awaiting SNF placement , insurance authorization.   Objective: Vitals:   05/30/22 1316 05/30/22 2126 05/31/22 0555 05/31/22 1330  BP: (!) 123/56 117/61 137/63 (!) 123/54  Pulse: 84 80 72 84  Resp: 18 14 14 12   Temp: 97.7 F (36.5 C) 98.1 F (36.7 C) 97.9 F (36.6 C) 98.3 F (36.8 C)  TempSrc: Oral Oral Oral Oral  SpO2: 100% 100% 100% 100%  Weight:      Height:        Intake/Output Summary (Last 24 hours) at 05/31/2022 1339 Last data filed at 05/31/2022 0600 Gross per 24 hour  Intake 120 ml  Output 750 ml  Net -630 ml   Filed Weights   05/26/22 0017  Weight: 60.8 kg    Examination:  General exam: Appears comfortable, deconditioned, elderly frail. Not in any distress. Respiratory system: CTA bilaterally, respiratory effort normal, RR 12 Cardiovascular system: S1-S2 heard, regular rate and rhythm, no murmur. Gastrointestinal system: Abdomen is soft, non tender, non distended, BS+ Central nervous system: Alert and oriented x 3. No focal neurological deficits. Extremities: No edema, no cyanosis, no clubbing.  Cellulitis improved. Skin: No rashes, lesions or ulcers Psychiatry:  Judgement and insight appear normal. Mood & affect appropriate.     Data Reviewed: I have personally reviewed following labs and imaging studies  CBC: Recent Labs  Lab 05/26/22 0020 05/27/22 0538 05/31/22 0551  WBC 7.8 5.8  --   NEUTROABS 6.7  --   --   HGB 9.3* 9.2* 9.6*  HCT 30.9* 29.8* 32.6*  MCV 97.5 98.3  --   PLT 412* 339  --    Basic Metabolic Panel: Recent Labs  Lab 05/26/22 0020 05/27/22 0533 05/27/22 0538 05/28/22 0547 05/29/22 0609  NA 136  --  139 138 137  K 3.5  --  3.1* 3.4* 3.7  CL 102  --  105 104 103  CO2 25  --  27 26 27   GLUCOSE 121*  --  101* 95 100*  BUN 21  --  12 11 13   CREATININE 0.85  --  0.75 0.79 0.79  CALCIUM 8.5*  --  8.3* 8.3* 8.4*  MG  --  2.1  --   --   --    GFR: Estimated Creatinine Clearance: 38.5 mL/min (by C-G formula based on SCr of 0.79 mg/dL). Liver Function Tests: Recent Labs  Lab 05/26/22 0020 05/27/22 0538  AST 43* 35  ALT 21 18  ALKPHOS 42 35*  BILITOT 0.6 0.3  PROT 6.8 6.0*  ALBUMIN 3.6 3.2*   Recent Labs  Lab 05/26/22 0020  LIPASE 24   No results for input(s): "AMMONIA" in the last 168 hours. Coagulation Profile: No results for input(s): "INR", "PROTIME" in the last 168 hours. Cardiac Enzymes: No results for input(s): "CKTOTAL", "CKMB", "CKMBINDEX", "TROPONINI" in the last 168 hours. BNP (last 3 results) No results for input(s): "PROBNP" in the last 8760 hours. HbA1C: No results for input(s): "HGBA1C" in the last 72 hours. CBG: No results for input(s): "GLUCAP" in the last 168 hours. Lipid Profile: No results for input(s): "CHOL", "HDL", "LDLCALC", "TRIG", "CHOLHDL", "LDLDIRECT" in the last 72 hours. Thyroid Function Tests: No results for input(s): "TSH", "T4TOTAL", "FREET4", "T3FREE", "THYROIDAB" in the last 72 hours. Anemia Panel: No results for input(s): "VITAMINB12", "FOLATE", "FERRITIN", "TIBC", "IRON", "RETICCTPCT" in the last 72 hours. Sepsis Labs: Recent Labs  Lab 05/26/22 0020   LATICACIDVEN 1.0    No results found for this or any previous visit (from the past 240 hour(s)).   Radiology Studies: No results found.  Scheduled Meds:  amLODipine  5 mg Oral Daily   benazepril  20 mg Oral q morning   cephALEXin  500 mg Oral Q8H   cholecalciferol  2,000 Units Oral Daily   cyanocobalamin  1,000 mcg Oral Daily   fentaNYL  1 patch Transdermal Q72H   levothyroxine  125 mcg Oral Q0600   LORazepam  0.5 mg Oral BID   pantoprazole  40 mg Oral Daily   polyethylene glycol  17 g Oral Daily   predniSONE  5 mg Oral Daily   senna-docusate  1 tablet Oral BID   sertraline  50 mg Oral Daily   Continuous Infusions:   LOS: 5 days    Time spent: 35 Mins    Willeen Niece, MD [facility]   If 7PM-7AM, please contact night-coverage

## 2022-06-01 DIAGNOSIS — L03115 Cellulitis of right lower limb: Secondary | ICD-10-CM | POA: Diagnosis not present

## 2022-06-01 NOTE — Progress Notes (Signed)
PROGRESS NOTE    Jocelyn Bauer  ZOX:[id number] DOB: 1928-11-06 DOA: 05/26/2022 PCP: Rodrigo Ran, MD   Brief Narrative:  This 89 year old female with history of anxiety, osteoarthritis, osteoporosis, rheumatoid arthritis, bilateral ankle fractures, hyperlipidemia, hypertension, hypothyroidism who presented  in the  ED with complaints of abdominal pain, right lower extremity edema, erythema.  On presentation she was afebrile, normotensive.  Labs showed WBC count of 7.8.  Chest x-ray did not show any acute disease. CT of abdomen/pelvis did not show any acute findings but showed small non- obstructing bilateral calculi, compression fraction of inferior endplate of L4.  Patient was admitted for the management of cellulitis of right lower extremity, clinically improved after starting on antibiotics.  PT recommending home health but daughter is requesting for SNF.  PT reevaluation pending.  Medically stable for discharge after disposition is sorted out.  Assessment & Plan:   Principal Problem:   Cellulitis of right lower extremity Active Problems:   Hypertension   Hypothyroidism   Hyperlipidemia   GERD (gastroesophageal reflux disease)   Anxiety   Chronic neck and back pain   RA (rheumatoid arthritis) (HCC)   Normocytic anemia  Right lower extremity cellulitis:  She presented with right lower extremity pain, and swelling.   She is started on ceftriaxone.  Currently she is afebrile, no leucocytosis. Looks much better now.  Antibiotics changed to oral keflex for 5 days. Doppler of the right lower extremity negative for DVT.   Abdominal pain:  Unclear etiology.  CT abdomen /pelvis did not show any acute findings.   Abdomen is benign on examination, abdomen pain has resolved.   Hypertension:  Continue amlodipine, benazepril.   BP better controlled.   Hypothyroidism:  Continue Synthroid.   Hypokalemia:  Replaced. Resolved.   Hyperlipidemia:  Intolerant to statin.   GERD: Continue  PPI   Anxiety:  Continue sertraline, lorazepam.   Chronic neck/back pain:  Continue Duragesic patch and  oxycodone.   CT imaging showed compression fraction of inferior endplate of L4.   Continue supportive care, pain management.   Rheumatoid  arthritis:  Continue methotrexate.  Follows with rheumatology.   Chronic hypoxic Respiratory failure:  On 2 L of oxygen at home chronically.    Lung nodule:  A 5 mm left lung base nodule, present on the prior CT. Not sure that a follow-up on this will be helpful due to her age.   Normocytic anemia: Currently hemoglobin stable.  No signs of any visible bleeding.   Deconditioning/debility:  Patient lives with her daughter and ambulates with the help of walker. She has hard time doing ADLs at home. PT recommended home health on discharge but daughter is requesting SNF.  TOC made aware. Repeat PT evaluation recommended SNF.  DVT prophylaxis: SCDs Code Status: DNR Family Communication: Daughter at bed side. Disposition Plan:     Status is: Inpatient Remains inpatient appropriate because: Admitted for right lower extremity cellulitis which has been improved with IV antibiotics.  Patient is very deconditioned and needs skilled nursing facility.  Awaiting insurance authorization.    Consultants:  None  Procedures: None  Antimicrobials: Anti-infectives (From admission, onward)    Start     Dose/Rate Route Frequency Ordered Stop   05/31/22 0000  cephALEXin (KEFLEX) 500 MG capsule        500 mg Oral Every 8 hours 05/31/22 1102 06/03/22 2359   05/29/22 0600  cephALEXin (KEFLEX) capsule 500 mg        500 mg Oral Every 8  hours 05/28/22 1228 06/03/22 0559   05/28/22 1000  cefTRIAXone (ROCEPHIN) 2 g in sodium chloride 0.9 % 100 mL IVPB  Status:  Discontinued        2 g 200 mL/hr over 30 Minutes Intravenous Every 24 hours 05/27/22 1118 05/27/22 1119   05/28/22 1000  cefTRIAXone (ROCEPHIN) 2 g in sodium chloride 0.9 % 100 mL IVPB  Status:   Discontinued        2 g 200 mL/hr over 30 Minutes Intravenous Every 24 hours 05/27/22 1119 05/28/22 1228   05/27/22 1215  cefTRIAXone (ROCEPHIN) 1 g in sodium chloride 0.9 % 100 mL IVPB        1 g 200 mL/hr over 30 Minutes Intravenous Every 24 hours 05/27/22 1119 05/27/22 1319   05/26/22 1000  cefTRIAXone (ROCEPHIN) 1 g in sodium chloride 0.9 % 100 mL IVPB  Status:  Discontinued        1 g 200 mL/hr over 30 Minutes Intravenous Every 24 hours 05/26/22 0853 05/27/22 1118   05/26/22 0200  ceFAZolin (ANCEF) IVPB 1 g/50 mL premix        1 g 100 mL/hr over 30 Minutes Intravenous  Once 05/26/22 0157 05/26/22 0704      Subjective: She was seen and examined at bedside.  Overnight events noted. Patient reports feeling very much improved.  Still has some weakness but improving. Patient is awaiting SNF placement , insurance authorization.   Objective: Vitals:   05/31/22 0555 05/31/22 1330 05/31/22 2105 06/01/22 0539  BP: 137/63 (!) 123/54 104/62 111/76  Pulse: 72 84 71 75  Resp: 14 12 18 16   Temp: 97.9 F (36.6 C) 98.3 F (36.8 C) 98.4 F (36.9 C) 97.7 F (36.5 C)  TempSrc: Oral Oral Oral Oral  SpO2: 100% 100% 100%   Weight:      Height:        Intake/Output Summary (Last 24 hours) at 06/01/2022 1138 Last data filed at 06/01/2022 0700 Gross per 24 hour  Intake 240 ml  Output 600 ml  Net -360 ml   Filed Weights   05/26/22 0017  Weight: 60.8 kg    Examination:  General exam: Appears comfortable, deconditioned, elderly, frail lady, not in any acute distress. Respiratory system: CTA bilaterally, respiratory effort normal, RR 13 Cardiovascular system: S1-S2 heard, regular rate and rhythm, no murmur. Gastrointestinal system: Abdomen is soft, non tender, non distended, BS+ Central nervous system: Alert and oriented x 3. No focal neurological deficits. Extremities: No edema, no cyanosis, no clubbing.  Cellulitis improved. Skin: No rashes, lesions or ulcers Psychiatry: Judgement  and insight appear normal. Mood & affect appropriate.     Data Reviewed: I have personally reviewed following labs and imaging studies  CBC: Recent Labs  Lab 05/26/22 0020 05/27/22 0538 05/31/22 0551  WBC 7.8 5.8  --   NEUTROABS 6.7  --   --   HGB 9.3* 9.2* 9.6*  HCT 30.9* 29.8* 32.6*  MCV 97.5 98.3  --   PLT 412* 339  --    Basic Metabolic Panel: Recent Labs  Lab 05/26/22 0020 05/27/22 0533 05/27/22 0538 05/28/22 0547 05/29/22 0609  NA 136  --  139 138 137  K 3.5  --  3.1* 3.4* 3.7  CL 102  --  105 104 103  CO2 25  --  27 26 27   GLUCOSE 121*  --  101* 95 100*  BUN 21  --  12 11 13   CREATININE 0.85  --  0.75 0.79 0.79  CALCIUM 8.5*  --  8.3* 8.3* 8.4*  MG  --  2.1  --   --   --    GFR: Estimated Creatinine Clearance: 38.5 mL/min (by C-G formula based on SCr of 0.79 mg/dL). Liver Function Tests: Recent Labs  Lab 05/26/22 0020 05/27/22 0538  AST 43* 35  ALT 21 18  ALKPHOS 42 35*  BILITOT 0.6 0.3  PROT 6.8 6.0*  ALBUMIN 3.6 3.2*   Recent Labs  Lab 05/26/22 0020  LIPASE 24   No results for input(s): "AMMONIA" in the last 168 hours. Coagulation Profile: No results for input(s): "INR", "PROTIME" in the last 168 hours. Cardiac Enzymes: No results for input(s): "CKTOTAL", "CKMB", "CKMBINDEX", "TROPONINI" in the last 168 hours. BNP (last 3 results) No results for input(s): "PROBNP" in the last 8760 hours. HbA1C: No results for input(s): "HGBA1C" in the last 72 hours. CBG: No results for input(s): "GLUCAP" in the last 168 hours. Lipid Profile: No results for input(s): "CHOL", "HDL", "LDLCALC", "TRIG", "CHOLHDL", "LDLDIRECT" in the last 72 hours. Thyroid Function Tests: No results for input(s): "TSH", "T4TOTAL", "FREET4", "T3FREE", "THYROIDAB" in the last 72 hours. Anemia Panel: No results for input(s): "VITAMINB12", "FOLATE", "FERRITIN", "TIBC", "IRON", "RETICCTPCT" in the last 72 hours. Sepsis Labs: Recent Labs  Lab 05/26/22 0020  LATICACIDVEN 1.0     No results found for this or any previous visit (from the past 240 hour(s)).   Radiology Studies: No results found.  Scheduled Meds:  amLODipine  5 mg Oral Daily   benazepril  20 mg Oral q morning   cephALEXin  500 mg Oral Q8H   cholecalciferol  2,000 Units Oral Daily   cyanocobalamin  1,000 mcg Oral Daily   fentaNYL  1 patch Transdermal Q72H   levothyroxine  125 mcg Oral Q0600   LORazepam  0.5 mg Oral BID   pantoprazole  40 mg Oral Daily   polyethylene glycol  17 g Oral Daily   predniSONE  5 mg Oral Daily   senna-docusate  1 tablet Oral BID   sertraline  50 mg Oral Daily   Continuous Infusions:   LOS: 6 days    Time spent: 35 Mins    Willeen Niece, MD [facility]   If 7PM-7AM, please contact night-coverage

## 2022-06-02 DIAGNOSIS — L03115 Cellulitis of right lower limb: Secondary | ICD-10-CM | POA: Diagnosis not present

## 2022-06-02 NOTE — Progress Notes (Signed)
PROGRESS NOTE    Jocelyn Bauer  ZOX:[id number] DOB: 08-06-1928 DOA: 05/26/2022 PCP: Rodrigo Ran, MD   Brief Narrative:  This 89 year old female with history of anxiety, osteoarthritis, osteoporosis, rheumatoid arthritis, bilateral ankle fractures, hyperlipidemia, hypertension, hypothyroidism who presented  in the  ED with complaints of abdominal pain, right lower extremity edema, erythema.  On presentation she was afebrile, normotensive.  Labs showed WBC count of 7.8.  Chest x-ray did not show any acute disease. CT of abdomen/pelvis did not show any acute findings but showed small non- obstructing bilateral calculi, compression fraction of inferior endplate of L4.  Patient was admitted for the management of cellulitis of right lower extremity, clinically improved after starting on antibiotics.  PT recommending home health but daughter is requesting for SNF.  PT reevaluation pending.  Medically stable for discharge after disposition is sorted out.  Assessment & Plan:   Principal Problem:   Cellulitis of right lower extremity Active Problems:   Hypertension   Hypothyroidism   Hyperlipidemia   GERD (gastroesophageal reflux disease)   Anxiety   Chronic neck and back pain   RA (rheumatoid arthritis) (HCC)   Normocytic anemia  Right lower extremity cellulitis:  She presented with right lower extremity pain, and swelling.   She is started on ceftriaxone.  Currently she is afebrile, no leucocytosis. Looks much better now.  Antibiotics changed to oral keflex for 5 days. Doppler of the right lower extremity negative for DVT.   Abdominal pain:  Unclear etiology.  CT abdomen /pelvis did not show any acute findings.   Abdomen is benign on examination, abdomen pain has resolved.   Hypertension:  Continue amlodipine, benazepril.   BP well controlled.   Hypothyroidism:  Continue Synthroid.   Hypokalemia:  Replaced. Resolved.   Hyperlipidemia:  Intolerant to statin.   GERD: Continue  PPI   Anxiety:  Continue sertraline, lorazepam.   Chronic neck/back pain:  Continue Duragesic patch and  oxycodone.   CT imaging showed compression fraction of inferior endplate of L4.   Continue supportive care, pain management.   Rheumatoid  arthritis:  Continue methotrexate.  Follows with rheumatology.   Chronic hypoxic Respiratory failure:  On 2 L of oxygen at home chronically.    Lung nodule:  A 5 mm left lung base nodule, present on the prior CT. Not sure that a follow-up on this will be helpful due to her age.   Normocytic anemia: Currently hemoglobin stable.  No signs of any visible bleeding.   Deconditioning/debility:  Patient lives with her daughter and ambulates with the help of walker. She has hard time doing ADLs at home. PT recommended home health on discharge but daughter is requesting SNF.  TOC made aware. Repeat PT evaluation recommended SNF.  DVT prophylaxis: SCDs Code Status: DNR Family Communication: Daughter at bed side. Disposition Plan:     Status is: Inpatient Remains inpatient appropriate because: Admitted for right lower extremity cellulitis which has been improved with IV antibiotics.  Patient is very deconditioned and needs skilled nursing facility.  Awaiting insurance authorization.    Consultants:  None  Procedures: None  Antimicrobials: Anti-infectives (From admission, onward)    Start     Dose/Rate Route Frequency Ordered Stop   05/31/22 0000  cephALEXin (KEFLEX) 500 MG capsule        500 mg Oral Every 8 hours 05/31/22 1102 06/03/22 2359   05/29/22 0600  cephALEXin (KEFLEX) capsule 500 mg        500 mg Oral Every 8  hours 05/28/22 1228 06/03/22 0559   05/28/22 1000  cefTRIAXone (ROCEPHIN) 2 g in sodium chloride 0.9 % 100 mL IVPB  Status:  Discontinued        2 g 200 mL/hr over 30 Minutes Intravenous Every 24 hours 05/27/22 1118 05/27/22 1119   05/28/22 1000  cefTRIAXone (ROCEPHIN) 2 g in sodium chloride 0.9 % 100 mL IVPB  Status:   Discontinued        2 g 200 mL/hr over 30 Minutes Intravenous Every 24 hours 05/27/22 1119 05/28/22 1228   05/27/22 1215  cefTRIAXone (ROCEPHIN) 1 g in sodium chloride 0.9 % 100 mL IVPB        1 g 200 mL/hr over 30 Minutes Intravenous Every 24 hours 05/27/22 1119 05/27/22 1319   05/26/22 1000  cefTRIAXone (ROCEPHIN) 1 g in sodium chloride 0.9 % 100 mL IVPB  Status:  Discontinued        1 g 200 mL/hr over 30 Minutes Intravenous Every 24 hours 05/26/22 0853 05/27/22 1118   05/26/22 0200  ceFAZolin (ANCEF) IVPB 1 g/50 mL premix        1 g 100 mL/hr over 30 Minutes Intravenous  Once 05/26/22 0157 05/26/22 0704      Subjective: She was seen and examined at bedside.  Overnight events noted. Patient reports feeling very much improved.  Still has some weakness. Patient is awaiting SNF placement , insurance authorization.   Objective: Vitals:   06/01/22 0539 06/01/22 1356 06/01/22 2113 06/02/22 0620  BP: 111/76 (!) 138/59 (!) 113/53 128/60  Pulse: 75 87 81 76  Resp: 16  14 14   Temp: 97.7 F (36.5 C) 98.1 F (36.7 C) 98.9 F (37.2 C) 98.9 F (37.2 C)  TempSrc: Oral Oral Oral Oral  SpO2:  100% 100% 97%  Weight:      Height:        Intake/Output Summary (Last 24 hours) at 06/02/2022 1202 Last data filed at 06/02/2022 1000 Gross per 24 hour  Intake 240 ml  Output 350 ml  Net -110 ml   Filed Weights   05/26/22 0017  Weight: 60.8 kg    Examination:  General exam: Appears comfortable, deconditioned, elderly, frail old lady, not in any acute distress. Respiratory system: CTA bilaterally, respiratory effort normal, RR 15 Cardiovascular system: S1-S2 heard, regular rate and rhythm, no murmur. Gastrointestinal system: Abdomen is soft, non tender, non distended, BS+ Central nervous system: Alert and oriented x 3. No focal neurological deficits. Extremities: No edema, no cyanosis, no clubbing.  Cellulitis improved. Skin: No rashes, lesions or ulcers Psychiatry: Judgement and  insight appear normal. Mood & affect appropriate.     Data Reviewed: I have personally reviewed following labs and imaging studies  CBC: Recent Labs  Lab 05/27/22 0538 05/31/22 0551  WBC 5.8  --   HGB 9.2* 9.6*  HCT 29.8* 32.6*  MCV 98.3  --   PLT 339  --    Basic Metabolic Panel: Recent Labs  Lab 05/27/22 0533 05/27/22 0538 05/28/22 0547 05/29/22 0609  NA  --  139 138 137  K  --  3.1* 3.4* 3.7  CL  --  105 104 103  CO2  --  27 26 27   GLUCOSE  --  101* 95 100*  BUN  --  12 11 13   CREATININE  --  0.75 0.79 0.79  CALCIUM  --  8.3* 8.3* 8.4*  MG 2.1  --   --   --    GFR: Estimated Creatinine  Clearance: 38.5 mL/min (by C-G formula based on SCr of 0.79 mg/dL). Liver Function Tests: Recent Labs  Lab 05/27/22 0538  AST 35  ALT 18  ALKPHOS 35*  BILITOT 0.3  PROT 6.0*  ALBUMIN 3.2*   No results for input(s): "LIPASE", "AMYLASE" in the last 168 hours.  No results for input(s): "AMMONIA" in the last 168 hours. Coagulation Profile: No results for input(s): "INR", "PROTIME" in the last 168 hours. Cardiac Enzymes: No results for input(s): "CKTOTAL", "CKMB", "CKMBINDEX", "TROPONINI" in the last 168 hours. BNP (last 3 results) No results for input(s): "PROBNP" in the last 8760 hours. HbA1C: No results for input(s): "HGBA1C" in the last 72 hours. CBG: No results for input(s): "GLUCAP" in the last 168 hours. Lipid Profile: No results for input(s): "CHOL", "HDL", "LDLCALC", "TRIG", "CHOLHDL", "LDLDIRECT" in the last 72 hours. Thyroid Function Tests: No results for input(s): "TSH", "T4TOTAL", "FREET4", "T3FREE", "THYROIDAB" in the last 72 hours. Anemia Panel: No results for input(s): "VITAMINB12", "FOLATE", "FERRITIN", "TIBC", "IRON", "RETICCTPCT" in the last 72 hours. Sepsis Labs: No results for input(s): "PROCALCITON", "LATICACIDVEN" in the last 168 hours.   No results found for this or any previous visit (from the past 240 hour(s)).   Radiology Studies: No  results found.  Scheduled Meds:  amLODipine  5 mg Oral Daily   benazepril  20 mg Oral q morning   cephALEXin  500 mg Oral Q8H   cholecalciferol  2,000 Units Oral Daily   cyanocobalamin  1,000 mcg Oral Daily   fentaNYL  1 patch Transdermal Q72H   levothyroxine  125 mcg Oral Q0600   LORazepam  0.5 mg Oral BID   pantoprazole  40 mg Oral Daily   polyethylene glycol  17 g Oral Daily   predniSONE  5 mg Oral Daily   senna-docusate  1 tablet Oral BID   sertraline  50 mg Oral Daily   Continuous Infusions:   LOS: 7 days    Time spent: 35 Mins    Willeen Niece, MD [facility]   If 7PM-7AM, please contact night-coverage

## 2022-06-03 DIAGNOSIS — L03115 Cellulitis of right lower limb: Secondary | ICD-10-CM | POA: Diagnosis not present

## 2022-06-03 NOTE — Progress Notes (Signed)
PROGRESS NOTE    Jocelyn Bauer  ZOX:[id number] DOB: 07-Aug-1928 DOA: 05/26/2022 PCP: Rodrigo Ran, MD   Brief Narrative:  This 89 year old female with history of anxiety, osteoarthritis, osteoporosis, rheumatoid arthritis, bilateral ankle fractures, hyperlipidemia, hypertension, hypothyroidism who presented  in the  ED with complaints of abdominal pain, right lower extremity edema, erythema.  On presentation she was afebrile, normotensive.  Labs showed WBC count of 7.8.  Chest x-ray did not show any acute disease. CT of abdomen/pelvis did not show any acute findings but showed small non- obstructing bilateral calculi, compression fraction of inferior endplate of L4.  Patient was admitted for the management of cellulitis of right lower extremity, clinically improved after starting on antibiotics.  PT recommending home health but daughter is requesting for SNF.  PT reevaluation pending.  Medically stable for discharge after disposition is sorted out.  Assessment & Plan:   Principal Problem:   Cellulitis of right lower extremity Active Problems:   Hypertension   Hypothyroidism   Hyperlipidemia   GERD (gastroesophageal reflux disease)   Anxiety   Chronic neck and back pain   RA (rheumatoid arthritis) (HCC)   Normocytic anemia  Right lower extremity cellulitis:  She presented with right lower extremity pain, and swelling.   She is started on ceftriaxone.  Currently she is afebrile, no leucocytosis. Looks much better now.  She completed Keflex for 5 more days. Doppler of the right lower extremity negative for DVT.   Abdominal pain:  Unclear etiology.  CT abdomen /pelvis did not show any acute findings.   Abdomen is benign on examination, abdomen pain has resolved.   Hypertension:  Continue amlodipine, benazepril.   BP well controlled.   Hypothyroidism:  Continue Synthroid.   Hypokalemia:  Replaced. Resolved.   Hyperlipidemia:  Intolerant to statin.   GERD: Continue PPI    Anxiety:  Continue sertraline, lorazepam.   Chronic neck/back pain:  Continue Duragesic patch and oxycodone.   CT imaging showed compression fraction of inferior endplate of L4.   Continue supportive care, pain management.   Rheumatoid  arthritis:  Continue methotrexate.  Follows with rheumatology.   Chronic hypoxic Respiratory failure:  On 2 L of oxygen at home chronically.    Lung nodule:  A 5 mm left lung base nodule, present on the prior CT. Not sure that a follow-up on this will be helpful due to her age.   Normocytic anemia: Currently hemoglobin stable.  No signs of any visible bleeding.   Deconditioning/debility:  Patient lives with her daughter and ambulates with the help of walker. She has hard time doing ADLs at home. PT recommended home health on discharge but daughter is requesting SNF.  TOC made aware. Repeat PT evaluation recommended SNF.  DVT prophylaxis: SCDs Code Status: DNR Family Communication: Daughter at bed side. Disposition Plan:     Status is: Inpatient Remains inpatient appropriate because: Admitted for right lower extremity cellulitis which has been improved with IV antibiotics.  Patient is very deconditioned and needs skilled nursing facility.  Awaiting insurance authorization.    Consultants:  None  Procedures: None  Antimicrobials: Anti-infectives (From admission, onward)    Start     Dose/Rate Route Frequency Ordered Stop   05/31/22 0000  cephALEXin (KEFLEX) 500 MG capsule        500 mg Oral Every 8 hours 05/31/22 1102 06/03/22 2359   05/29/22 0600  cephALEXin (KEFLEX) capsule 500 mg        500 mg Oral Every 8 hours 05/28/22  1228 06/02/22 2055   05/28/22 1000  cefTRIAXone (ROCEPHIN) 2 g in sodium chloride 0.9 % 100 mL IVPB  Status:  Discontinued        2 g 200 mL/hr over 30 Minutes Intravenous Every 24 hours 05/27/22 1118 05/27/22 1119   05/28/22 1000  cefTRIAXone (ROCEPHIN) 2 g in sodium chloride 0.9 % 100 mL IVPB  Status:   Discontinued        2 g 200 mL/hr over 30 Minutes Intravenous Every 24 hours 05/27/22 1119 05/28/22 1228   05/27/22 1215  cefTRIAXone (ROCEPHIN) 1 g in sodium chloride 0.9 % 100 mL IVPB        1 g 200 mL/hr over 30 Minutes Intravenous Every 24 hours 05/27/22 1119 05/27/22 1319   05/26/22 1000  cefTRIAXone (ROCEPHIN) 1 g in sodium chloride 0.9 % 100 mL IVPB  Status:  Discontinued        1 g 200 mL/hr over 30 Minutes Intravenous Every 24 hours 05/26/22 0853 05/27/22 1118   05/26/22 0200  ceFAZolin (ANCEF) IVPB 1 g/50 mL premix        1 g 100 mL/hr over 30 Minutes Intravenous  Once 05/26/22 0157 05/26/22 0704      Subjective: She was seen and examined at bedside.  Overnight events noted. Patient reports feeling very much improved.  She still has some weakness. Patient is awaiting SNF placement , insurance authorization.   Objective: Vitals:   06/02/22 1340 06/02/22 1403 06/02/22 2104 06/03/22 0502  BP: (!) 85/42 (!) 127/52 130/65 122/67  Pulse: 82 79 88 76  Resp:   14 14  Temp: 99.3 F (37.4 C)  97.9 F (36.6 C) 98.6 F (37 C)  TempSrc: Oral  Oral Oral  SpO2: 90%  100% 100%  Weight:      Height:        Intake/Output Summary (Last 24 hours) at 06/03/2022 1244 Last data filed at 06/03/2022 0900 Gross per 24 hour  Intake 480 ml  Output 700 ml  Net -220 ml   Filed Weights   05/26/22 0017  Weight: 60.8 kg    Examination:  General exam: Appears comfortable, deconditioned, elderly frail lady, not in any distress. Respiratory system: CTA bilaterally, respiratory effort normal, RR 14 Cardiovascular system: S1-S2 heard, regular rate and rhythm, no murmur. Gastrointestinal system: Abdomen is soft, non tender, non distended, BS+ Central nervous system: Alert and oriented x 3. No focal neurological deficits. Extremities: No edema, no cyanosis, no clubbing.  Cellulitis improved. Skin: No rashes, lesions or ulcers Psychiatry: Judgement and insight appear normal. Mood & affect  appropriate.     Data Reviewed: I have personally reviewed following labs and imaging studies  CBC: Recent Labs  Lab 05/31/22 0551  HGB 9.6*  HCT 32.6*   Basic Metabolic Panel: Recent Labs  Lab 05/28/22 0547 05/29/22 0609  NA 138 137  K 3.4* 3.7  CL 104 103  CO2 26 27  GLUCOSE 95 100*  BUN 11 13  CREATININE 0.79 0.79  CALCIUM 8.3* 8.4*   GFR: Estimated Creatinine Clearance: 38.5 mL/min (by C-G formula based on SCr of 0.79 mg/dL). Liver Function Tests: No results for input(s): "AST", "ALT", "ALKPHOS", "BILITOT", "PROT", "ALBUMIN" in the last 168 hours.  No results for input(s): "LIPASE", "AMYLASE" in the last 168 hours.  No results for input(s): "AMMONIA" in the last 168 hours. Coagulation Profile: No results for input(s): "INR", "PROTIME" in the last 168 hours. Cardiac Enzymes: No results for input(s): "CKTOTAL", "CKMB", "CKMBINDEX", "TROPONINI"  in the last 168 hours. BNP (last 3 results) No results for input(s): "PROBNP" in the last 8760 hours. HbA1C: No results for input(s): "HGBA1C" in the last 72 hours. CBG: No results for input(s): "GLUCAP" in the last 168 hours. Lipid Profile: No results for input(s): "CHOL", "HDL", "LDLCALC", "TRIG", "CHOLHDL", "LDLDIRECT" in the last 72 hours. Thyroid Function Tests: No results for input(s): "TSH", "T4TOTAL", "FREET4", "T3FREE", "THYROIDAB" in the last 72 hours. Anemia Panel: No results for input(s): "VITAMINB12", "FOLATE", "FERRITIN", "TIBC", "IRON", "RETICCTPCT" in the last 72 hours. Sepsis Labs: No results for input(s): "PROCALCITON", "LATICACIDVEN" in the last 168 hours.   No results found for this or any previous visit (from the past 240 hour(s)).   Radiology Studies: No results found.  Scheduled Meds:  amLODipine  5 mg Oral Daily   benazepril  20 mg Oral q morning   cholecalciferol  2,000 Units Oral Daily   cyanocobalamin  1,000 mcg Oral Daily   fentaNYL  1 patch Transdermal Q72H   levothyroxine  125 mcg  Oral Q0600   LORazepam  0.5 mg Oral BID   pantoprazole  40 mg Oral Daily   polyethylene glycol  17 g Oral Daily   predniSONE  5 mg Oral Daily   senna-docusate  1 tablet Oral BID   sertraline  50 mg Oral Daily   Continuous Infusions:   LOS: 8 days    Time spent: 35 Mins    Willeen Niece, MD [facility]   If 7PM-7AM, please contact night-coverage

## 2022-06-03 NOTE — Progress Notes (Signed)
Physical Therapy Treatment Patient Details Name: Jocelyn Bauer MRN: [id number] DOB: 15-May-1928 Today's Date: 06/03/2022   History of Present Illness 89 yo female admitted to hospital on 05/26/2022 due to radiating RLQ abdominal pain, RLE edema, weeping, rubor and pain. Imaging completed including CXR and CT of abdomen/pelvis negative for acute findings, with noted known L4 compression fx. Abn lab findings. Pt was found to have pain associated with R LE cellulitis impacting pts safety and I with functional mobility tasks. Pt requires 2 L/min supplemental O2 at baseline. Pt has PMH including but not limited to: RA, B ankle fx s/p ORIF, chronic neck and LBP, HDL, HTN,  B total elbow replacement, and L THA .    PT Comments    Just prior to PT session, pt transferred recliner to bedside commode to bed using Stedy. She was too fatigued to get up again but agreed to bed level strengthening exercises.     Recommendations for follow up therapy are one component of a multi-disciplinary discharge planning process, led by the attending physician.  Recommendations may be updated based on patient status, additional functional criteria and insurance authorization.  Follow Up Recommendations  Can patient physically be transported by private vehicle: No    Assistance Recommended at Discharge Frequent or constant Supervision/Assistance  Patient can return home with the following Assistance with cooking/housework;Direct supervision/assist for medications management;Direct supervision/assist for financial management;Assist for transportation;Help with stairs or ramp for entrance;A little help with walking and/or transfers;A little help with bathing/dressing/bathroom   Equipment Recommendations  None recommended by PT    Recommendations for Other Services       Precautions / Restrictions Precautions Precautions: Fall Precaution Comments: chronic 2L O2 Restrictions Weight Bearing Restrictions: No      Mobility  Bed Mobility               General bed mobility comments: pt had just transferred from recliner to bedside commode to bed with Stedy with NT, she's too fatigued to get up again, pt agreed to bed level exercises    Transfers                        Ambulation/Gait                   Stairs             Wheelchair Mobility    Modified Rankin (Stroke Patients Only)       Balance                                            Cognition Arousal/Alertness: Awake/alert Behavior During Therapy: WFL for tasks assessed/performed Overall Cognitive Status: Within Functional Limits for tasks assessed                                          Exercises General Exercises - Upper Extremity Shoulder Flexion: AAROM, Both, 10 reps, Supine General Exercises - Lower Extremity Ankle Circles/Pumps: AROM, Both, 10 reps, Supine Short Arc Quad: AROM, Both, 20 reps, Supine Heel Slides: AROM, Both, Supine, 20 reps Hip ABduction/ADduction: AROM, Supine, Both, 15 reps    General Comments        Pertinent Vitals/Pain Pain Assessment Faces Pain Scale: Hurts a little bit  Pain Location: R knee with movement Pain Descriptors / Indicators: Grimacing Pain Intervention(s): Limited activity within patient's tolerance    Home Living                          Prior Function            PT Goals (current goals can now be found in the care plan section) Acute Rehab PT Goals Patient Stated Goal: to go home PT Goal Formulation: With patient Time For Goal Achievement: 06/10/22 Potential to Achieve Goals: Good Progress towards PT goals: Progressing toward goals    Frequency    Min 1X/week      PT Plan Current plan remains appropriate    Co-evaluation              AM-PAC PT "6 Clicks" Mobility   Outcome Measure  Help needed turning from your back to your side while in a flat bed without using  bedrails?: A Little Help needed moving from lying on your back to sitting on the side of a flat bed without using bedrails?: A Little Help needed moving to and from a bed to a chair (including a wheelchair)?: A Little Help needed standing up from a chair using your arms (e.g., wheelchair or bedside chair)?: A Little Help needed to walk in hospital room?: A Lot Help needed climbing 3-5 steps with a railing? : Total 6 Click Score: 15    End of Session   Activity Tolerance: Patient tolerated treatment well Patient left: with call bell/phone within reach;in bed;with bed alarm set Nurse Communication: Mobility status PT Visit Diagnosis: Muscle weakness (generalized) (M62.81);Unsteadiness on feet (R26.81);Other abnormalities of gait and mobility (R26.89) Pain - Right/Left: Right Pain - part of body: Knee     Time: 1610-9604 PT Time Calculation (min) (ACUTE ONLY): 8 min  Charges:  $Therapeutic Exercise: 8-22 mins                     Ralene Bathe Kistler PT 06/03/2022  Acute Rehabilitation Services  Office [phone]

## 2022-06-04 DIAGNOSIS — R3915 Urgency of urination: Secondary | ICD-10-CM | POA: Diagnosis not present

## 2022-06-04 DIAGNOSIS — R600 Localized edema: Secondary | ICD-10-CM | POA: Diagnosis not present

## 2022-06-04 DIAGNOSIS — R296 Repeated falls: Secondary | ICD-10-CM | POA: Diagnosis not present

## 2022-06-04 DIAGNOSIS — K5900 Constipation, unspecified: Secondary | ICD-10-CM | POA: Diagnosis not present

## 2022-06-04 DIAGNOSIS — R911 Solitary pulmonary nodule: Secondary | ICD-10-CM | POA: Diagnosis not present

## 2022-06-04 DIAGNOSIS — L03115 Cellulitis of right lower limb: Secondary | ICD-10-CM | POA: Diagnosis not present

## 2022-06-04 DIAGNOSIS — L0390 Cellulitis, unspecified: Secondary | ICD-10-CM | POA: Diagnosis not present

## 2022-06-04 DIAGNOSIS — Z7401 Bed confinement status: Secondary | ICD-10-CM | POA: Diagnosis not present

## 2022-06-04 DIAGNOSIS — Z9181 History of falling: Secondary | ICD-10-CM | POA: Diagnosis not present

## 2022-06-04 DIAGNOSIS — H6123 Impacted cerumen, bilateral: Secondary | ICD-10-CM | POA: Diagnosis not present

## 2022-06-04 DIAGNOSIS — J189 Pneumonia, unspecified organism: Secondary | ICD-10-CM | POA: Diagnosis not present

## 2022-06-04 DIAGNOSIS — I10 Essential (primary) hypertension: Secondary | ICD-10-CM | POA: Diagnosis not present

## 2022-06-04 DIAGNOSIS — R5381 Other malaise: Secondary | ICD-10-CM | POA: Diagnosis not present

## 2022-06-04 DIAGNOSIS — R1310 Dysphagia, unspecified: Secondary | ICD-10-CM | POA: Diagnosis not present

## 2022-06-04 DIAGNOSIS — R41841 Cognitive communication deficit: Secondary | ICD-10-CM | POA: Diagnosis not present

## 2022-06-04 DIAGNOSIS — E039 Hypothyroidism, unspecified: Secondary | ICD-10-CM | POA: Diagnosis not present

## 2022-06-04 DIAGNOSIS — B351 Tinea unguium: Secondary | ICD-10-CM | POA: Diagnosis not present

## 2022-06-04 DIAGNOSIS — F32A Depression, unspecified: Secondary | ICD-10-CM | POA: Diagnosis not present

## 2022-06-04 DIAGNOSIS — R278 Other lack of coordination: Secondary | ICD-10-CM | POA: Diagnosis not present

## 2022-06-04 DIAGNOSIS — J449 Chronic obstructive pulmonary disease, unspecified: Secondary | ICD-10-CM | POA: Diagnosis not present

## 2022-06-04 DIAGNOSIS — F419 Anxiety disorder, unspecified: Secondary | ICD-10-CM | POA: Diagnosis not present

## 2022-06-04 DIAGNOSIS — M6281 Muscle weakness (generalized): Secondary | ICD-10-CM | POA: Diagnosis not present

## 2022-06-04 DIAGNOSIS — K219 Gastro-esophageal reflux disease without esophagitis: Secondary | ICD-10-CM | POA: Diagnosis not present

## 2022-06-04 DIAGNOSIS — D649 Anemia, unspecified: Secondary | ICD-10-CM | POA: Diagnosis not present

## 2022-06-04 DIAGNOSIS — G894 Chronic pain syndrome: Secondary | ICD-10-CM | POA: Diagnosis not present

## 2022-06-04 DIAGNOSIS — M069 Rheumatoid arthritis, unspecified: Secondary | ICD-10-CM | POA: Diagnosis not present

## 2022-06-04 DIAGNOSIS — R531 Weakness: Secondary | ICD-10-CM | POA: Diagnosis not present

## 2022-06-04 DIAGNOSIS — E559 Vitamin D deficiency, unspecified: Secondary | ICD-10-CM | POA: Diagnosis not present

## 2022-06-04 DIAGNOSIS — R100 Acute abdomen: Secondary | ICD-10-CM | POA: Diagnosis not present

## 2022-06-04 DIAGNOSIS — R059 Cough, unspecified: Secondary | ICD-10-CM | POA: Diagnosis not present

## 2022-06-04 DIAGNOSIS — J9611 Chronic respiratory failure with hypoxia: Secondary | ICD-10-CM | POA: Diagnosis not present

## 2022-06-04 DIAGNOSIS — E538 Deficiency of other specified B group vitamins: Secondary | ICD-10-CM | POA: Diagnosis not present

## 2022-06-04 DIAGNOSIS — R2681 Unsteadiness on feet: Secondary | ICD-10-CM | POA: Diagnosis not present

## 2022-06-04 NOTE — TOC Transition Note (Addendum)
Transition of Care [facility]) - CM/SW Discharge Note   Patient Details  Name: Jocelyn Bauer MRN: [id number] Date of Birth: Sep 21, 1928  [facility]) CM/SW Contact:  Beckie Busing, RN Phone Number:[phone]  06/04/2022, 1:58 PM   Clinical Narrative:    Per Meryle Ready admissions director, patient is ok to discharge to [facility]. D/c packet is at nurses station. Daughter [name] has been updated. Transportation has been arranged per PTAR.  Discharge packet is at nurses station. ETA 1.5 hours  Please call report to  [facility]  [phone] Room # 38         Patient Goals and CMS Choice      Discharge Placement                         Discharge Plan and Services Additional resources added to the After Visit Summary for                                       Social Determinants of Health (SDOH) Interventions SDOH Screenings   Food Insecurity: No Food Insecurity (05/26/2022)  Housing: Low Risk  (05/26/2022)  Transportation Needs: No Transportation Needs (05/26/2022)  Utilities: Not At Risk (05/26/2022)  Depression (PHQ2-9): Low Risk  (02/05/2021)  Tobacco Use: Medium Risk (05/26/2022)     Readmission Risk Interventions     No data to display

## 2022-06-04 NOTE — Progress Notes (Signed)
Report given to Anissa at [facility].

## 2022-06-04 NOTE — TOC Progression Note (Signed)
Transition of Care [facility]) - Progression Note    Patient Details  Name: Jocelyn Bauer MRN: [id number] Date of Birth: 1928/12/06  Transition of Care [facility]) CM/SW Contact  Beckie Busing, RN Phone Number:[phone]  06/04/2022, 10:29 AM  Clinical Narrative:    CM has confirmed bed availability with Meryle Ready admissions director for [facility]. Facility can accept patient today but it will be layer this afternoon due to patient being discharged and room will need to be cleaned. Meryle Ready will notify CM when facility is ready to accept patient.         Expected Discharge Plan and Services         Expected Discharge Date: 06/04/22                                     Social Determinants of Health (SDOH) Interventions SDOH Screenings   Food Insecurity: No Food Insecurity (05/26/2022)  Housing: Low Risk  (05/26/2022)  Transportation Needs: No Transportation Needs (05/26/2022)  Utilities: Not At Risk (05/26/2022)  Depression (PHQ2-9): Low Risk  (02/05/2021)  Tobacco Use: Medium Risk (05/26/2022)    Readmission Risk Interventions     No data to display

## 2022-06-04 NOTE — Care Management Important Message (Signed)
Important Message  Patient Details IM Letter given. Name: Jocelyn Bauer MRN: [id number] Date of Birth: 1928/06/17   Medicare Important Message Given:  Yes     Caren Macadam 06/04/2022, 1:32 PM

## 2022-06-05 DIAGNOSIS — H6123 Impacted cerumen, bilateral: Secondary | ICD-10-CM | POA: Diagnosis not present

## 2022-06-05 DIAGNOSIS — R100 Acute abdomen: Secondary | ICD-10-CM | POA: Diagnosis not present

## 2022-06-05 DIAGNOSIS — R5381 Other malaise: Secondary | ICD-10-CM | POA: Diagnosis not present

## 2022-06-05 DIAGNOSIS — E039 Hypothyroidism, unspecified: Secondary | ICD-10-CM | POA: Diagnosis not present

## 2022-06-05 DIAGNOSIS — J9611 Chronic respiratory failure with hypoxia: Secondary | ICD-10-CM | POA: Diagnosis not present

## 2022-06-05 DIAGNOSIS — F32A Depression, unspecified: Secondary | ICD-10-CM | POA: Diagnosis not present

## 2022-06-05 DIAGNOSIS — L0390 Cellulitis, unspecified: Secondary | ICD-10-CM | POA: Diagnosis not present

## 2022-06-05 DIAGNOSIS — K219 Gastro-esophageal reflux disease without esophagitis: Secondary | ICD-10-CM | POA: Diagnosis not present

## 2022-06-05 DIAGNOSIS — D649 Anemia, unspecified: Secondary | ICD-10-CM | POA: Diagnosis not present

## 2022-06-05 DIAGNOSIS — M069 Rheumatoid arthritis, unspecified: Secondary | ICD-10-CM | POA: Diagnosis not present

## 2022-06-05 DIAGNOSIS — K5900 Constipation, unspecified: Secondary | ICD-10-CM | POA: Diagnosis not present

## 2022-06-05 DIAGNOSIS — I10 Essential (primary) hypertension: Secondary | ICD-10-CM | POA: Diagnosis not present

## 2022-06-06 DIAGNOSIS — K5900 Constipation, unspecified: Secondary | ICD-10-CM | POA: Diagnosis not present

## 2022-06-06 DIAGNOSIS — G894 Chronic pain syndrome: Secondary | ICD-10-CM | POA: Diagnosis not present

## 2022-06-06 DIAGNOSIS — K219 Gastro-esophageal reflux disease without esophagitis: Secondary | ICD-10-CM | POA: Diagnosis not present

## 2022-06-06 DIAGNOSIS — E039 Hypothyroidism, unspecified: Secondary | ICD-10-CM | POA: Diagnosis not present

## 2022-06-06 DIAGNOSIS — M069 Rheumatoid arthritis, unspecified: Secondary | ICD-10-CM | POA: Diagnosis not present

## 2022-06-06 DIAGNOSIS — R100 Acute abdomen: Secondary | ICD-10-CM | POA: Diagnosis not present

## 2022-06-06 DIAGNOSIS — R5381 Other malaise: Secondary | ICD-10-CM | POA: Diagnosis not present

## 2022-06-06 DIAGNOSIS — H6123 Impacted cerumen, bilateral: Secondary | ICD-10-CM | POA: Diagnosis not present

## 2022-06-06 DIAGNOSIS — J9611 Chronic respiratory failure with hypoxia: Secondary | ICD-10-CM | POA: Diagnosis not present

## 2022-06-06 DIAGNOSIS — D649 Anemia, unspecified: Secondary | ICD-10-CM | POA: Diagnosis not present

## 2022-06-06 DIAGNOSIS — E559 Vitamin D deficiency, unspecified: Secondary | ICD-10-CM | POA: Diagnosis not present

## 2022-06-06 DIAGNOSIS — L0390 Cellulitis, unspecified: Secondary | ICD-10-CM | POA: Diagnosis not present

## 2022-06-10 DIAGNOSIS — R5381 Other malaise: Secondary | ICD-10-CM | POA: Diagnosis not present

## 2022-06-10 DIAGNOSIS — J9611 Chronic respiratory failure with hypoxia: Secondary | ICD-10-CM | POA: Diagnosis not present

## 2022-06-10 DIAGNOSIS — I10 Essential (primary) hypertension: Secondary | ICD-10-CM | POA: Diagnosis not present

## 2022-06-10 DIAGNOSIS — L0390 Cellulitis, unspecified: Secondary | ICD-10-CM | POA: Diagnosis not present

## 2022-06-10 DIAGNOSIS — R600 Localized edema: Secondary | ICD-10-CM | POA: Diagnosis not present

## 2022-06-10 DIAGNOSIS — D649 Anemia, unspecified: Secondary | ICD-10-CM | POA: Diagnosis not present

## 2022-06-10 DIAGNOSIS — G894 Chronic pain syndrome: Secondary | ICD-10-CM | POA: Diagnosis not present

## 2022-06-10 DIAGNOSIS — K5900 Constipation, unspecified: Secondary | ICD-10-CM | POA: Diagnosis not present

## 2022-06-10 DIAGNOSIS — R3915 Urgency of urination: Secondary | ICD-10-CM | POA: Diagnosis not present

## 2022-06-19 DIAGNOSIS — R5381 Other malaise: Secondary | ICD-10-CM | POA: Diagnosis not present

## 2022-06-19 DIAGNOSIS — E559 Vitamin D deficiency, unspecified: Secondary | ICD-10-CM | POA: Diagnosis not present

## 2022-06-19 DIAGNOSIS — R059 Cough, unspecified: Secondary | ICD-10-CM | POA: Diagnosis not present

## 2022-06-19 DIAGNOSIS — R600 Localized edema: Secondary | ICD-10-CM | POA: Diagnosis not present

## 2022-06-19 DIAGNOSIS — K5900 Constipation, unspecified: Secondary | ICD-10-CM | POA: Diagnosis not present

## 2022-06-19 DIAGNOSIS — I10 Essential (primary) hypertension: Secondary | ICD-10-CM | POA: Diagnosis not present

## 2022-06-19 DIAGNOSIS — J9611 Chronic respiratory failure with hypoxia: Secondary | ICD-10-CM | POA: Diagnosis not present

## 2022-06-19 DIAGNOSIS — K219 Gastro-esophageal reflux disease without esophagitis: Secondary | ICD-10-CM | POA: Diagnosis not present

## 2022-06-19 DIAGNOSIS — D649 Anemia, unspecified: Secondary | ICD-10-CM | POA: Diagnosis not present

## 2022-06-19 DIAGNOSIS — G894 Chronic pain syndrome: Secondary | ICD-10-CM | POA: Diagnosis not present

## 2022-06-19 DIAGNOSIS — E039 Hypothyroidism, unspecified: Secondary | ICD-10-CM | POA: Diagnosis not present

## 2022-06-19 DIAGNOSIS — L0390 Cellulitis, unspecified: Secondary | ICD-10-CM | POA: Diagnosis not present

## 2022-06-26 DIAGNOSIS — J189 Pneumonia, unspecified organism: Secondary | ICD-10-CM | POA: Diagnosis not present

## 2022-06-26 DIAGNOSIS — J9611 Chronic respiratory failure with hypoxia: Secondary | ICD-10-CM | POA: Diagnosis not present

## 2022-06-26 DIAGNOSIS — E538 Deficiency of other specified B group vitamins: Secondary | ICD-10-CM | POA: Diagnosis not present

## 2022-06-26 DIAGNOSIS — D649 Anemia, unspecified: Secondary | ICD-10-CM | POA: Diagnosis not present

## 2022-06-26 DIAGNOSIS — G894 Chronic pain syndrome: Secondary | ICD-10-CM | POA: Diagnosis not present

## 2022-06-26 DIAGNOSIS — K219 Gastro-esophageal reflux disease without esophagitis: Secondary | ICD-10-CM | POA: Diagnosis not present

## 2022-06-26 DIAGNOSIS — R600 Localized edema: Secondary | ICD-10-CM | POA: Diagnosis not present

## 2022-06-26 DIAGNOSIS — R5381 Other malaise: Secondary | ICD-10-CM | POA: Diagnosis not present

## 2022-06-26 DIAGNOSIS — E559 Vitamin D deficiency, unspecified: Secondary | ICD-10-CM | POA: Diagnosis not present

## 2022-06-26 DIAGNOSIS — K5900 Constipation, unspecified: Secondary | ICD-10-CM | POA: Diagnosis not present

## 2022-06-26 DIAGNOSIS — M069 Rheumatoid arthritis, unspecified: Secondary | ICD-10-CM | POA: Diagnosis not present

## 2022-06-26 DIAGNOSIS — E039 Hypothyroidism, unspecified: Secondary | ICD-10-CM | POA: Diagnosis not present

## 2022-07-04 DIAGNOSIS — M069 Rheumatoid arthritis, unspecified: Secondary | ICD-10-CM | POA: Diagnosis not present

## 2022-07-04 DIAGNOSIS — E538 Deficiency of other specified B group vitamins: Secondary | ICD-10-CM | POA: Diagnosis not present

## 2022-07-04 DIAGNOSIS — D649 Anemia, unspecified: Secondary | ICD-10-CM | POA: Diagnosis not present

## 2022-07-04 DIAGNOSIS — F419 Anxiety disorder, unspecified: Secondary | ICD-10-CM | POA: Diagnosis not present

## 2022-07-04 DIAGNOSIS — R5381 Other malaise: Secondary | ICD-10-CM | POA: Diagnosis not present

## 2022-07-04 DIAGNOSIS — E039 Hypothyroidism, unspecified: Secondary | ICD-10-CM | POA: Diagnosis not present

## 2022-07-04 DIAGNOSIS — J9611 Chronic respiratory failure with hypoxia: Secondary | ICD-10-CM | POA: Diagnosis not present

## 2022-07-04 DIAGNOSIS — R600 Localized edema: Secondary | ICD-10-CM | POA: Diagnosis not present

## 2022-07-04 DIAGNOSIS — K5900 Constipation, unspecified: Secondary | ICD-10-CM | POA: Diagnosis not present

## 2022-07-04 DIAGNOSIS — K219 Gastro-esophageal reflux disease without esophagitis: Secondary | ICD-10-CM | POA: Diagnosis not present

## 2022-07-10 DIAGNOSIS — E039 Hypothyroidism, unspecified: Secondary | ICD-10-CM | POA: Diagnosis not present

## 2022-07-10 DIAGNOSIS — K219 Gastro-esophageal reflux disease without esophagitis: Secondary | ICD-10-CM | POA: Diagnosis not present

## 2022-07-10 DIAGNOSIS — F32A Depression, unspecified: Secondary | ICD-10-CM | POA: Diagnosis not present

## 2022-07-10 DIAGNOSIS — E559 Vitamin D deficiency, unspecified: Secondary | ICD-10-CM | POA: Diagnosis not present

## 2022-07-10 DIAGNOSIS — F419 Anxiety disorder, unspecified: Secondary | ICD-10-CM | POA: Diagnosis not present

## 2022-07-10 DIAGNOSIS — M069 Rheumatoid arthritis, unspecified: Secondary | ICD-10-CM | POA: Diagnosis not present

## 2022-07-10 DIAGNOSIS — K5900 Constipation, unspecified: Secondary | ICD-10-CM | POA: Diagnosis not present

## 2022-07-10 DIAGNOSIS — R5381 Other malaise: Secondary | ICD-10-CM | POA: Diagnosis not present

## 2022-07-10 DIAGNOSIS — J189 Pneumonia, unspecified organism: Secondary | ICD-10-CM | POA: Diagnosis not present

## 2022-07-10 DIAGNOSIS — D649 Anemia, unspecified: Secondary | ICD-10-CM | POA: Diagnosis not present

## 2022-07-10 DIAGNOSIS — R600 Localized edema: Secondary | ICD-10-CM | POA: Diagnosis not present

## 2022-07-26 DIAGNOSIS — R911 Solitary pulmonary nodule: Secondary | ICD-10-CM | POA: Diagnosis not present

## 2022-07-26 DIAGNOSIS — M069 Rheumatoid arthritis, unspecified: Secondary | ICD-10-CM | POA: Diagnosis not present

## 2022-07-26 DIAGNOSIS — I119 Hypertensive heart disease without heart failure: Secondary | ICD-10-CM | POA: Diagnosis not present

## 2022-07-26 DIAGNOSIS — R5381 Other malaise: Secondary | ICD-10-CM | POA: Diagnosis not present

## 2022-07-26 DIAGNOSIS — L03115 Cellulitis of right lower limb: Secondary | ICD-10-CM | POA: Diagnosis not present

## 2022-07-26 DIAGNOSIS — D649 Anemia, unspecified: Secondary | ICD-10-CM | POA: Diagnosis not present

## 2022-07-26 DIAGNOSIS — G894 Chronic pain syndrome: Secondary | ICD-10-CM | POA: Diagnosis not present

## 2022-07-26 DIAGNOSIS — J9611 Chronic respiratory failure with hypoxia: Secondary | ICD-10-CM | POA: Diagnosis not present

## 2022-07-26 DIAGNOSIS — N200 Calculus of kidney: Secondary | ICD-10-CM | POA: Diagnosis not present

## 2022-07-26 DIAGNOSIS — E876 Hypokalemia: Secondary | ICD-10-CM | POA: Diagnosis not present

## 2022-07-26 DIAGNOSIS — S32040A Wedge compression fracture of fourth lumbar vertebra, initial encounter for closed fracture: Secondary | ICD-10-CM | POA: Diagnosis not present

## 2022-09-03 ENCOUNTER — Emergency Department (HOSPITAL_COMMUNITY): Payer: Medicare Other

## 2022-09-03 ENCOUNTER — Encounter (HOSPITAL_COMMUNITY): Payer: Self-pay

## 2022-09-03 ENCOUNTER — Other Ambulatory Visit: Payer: Self-pay

## 2022-09-03 ENCOUNTER — Inpatient Hospital Stay (HOSPITAL_COMMUNITY)
Admission: EM | Admit: 2022-09-03 | Discharge: 2022-09-09 | DRG: 811 | Disposition: A | Discharge status: Skilled Nursing Facility | Payer: Medicare Other | Attending: Internal Medicine | Admitting: Internal Medicine

## 2022-09-03 DIAGNOSIS — E785 Hyperlipidemia, unspecified: Secondary | ICD-10-CM | POA: Diagnosis present

## 2022-09-03 DIAGNOSIS — Z9981 Dependence on supplemental oxygen: Secondary | ICD-10-CM | POA: Diagnosis not present

## 2022-09-03 DIAGNOSIS — R443 Hallucinations, unspecified: Secondary | ICD-10-CM | POA: Diagnosis present

## 2022-09-03 DIAGNOSIS — Z8701 Personal history of pneumonia (recurrent): Secondary | ICD-10-CM

## 2022-09-03 DIAGNOSIS — J189 Pneumonia, unspecified organism: Secondary | ICD-10-CM | POA: Diagnosis not present

## 2022-09-03 DIAGNOSIS — N200 Calculus of kidney: Secondary | ICD-10-CM | POA: Diagnosis not present

## 2022-09-03 DIAGNOSIS — F05 Delirium due to known physiological condition: Secondary | ICD-10-CM | POA: Diagnosis present

## 2022-09-03 DIAGNOSIS — G8929 Other chronic pain: Secondary | ICD-10-CM | POA: Diagnosis present

## 2022-09-03 DIAGNOSIS — R262 Difficulty in walking, not elsewhere classified: Secondary | ICD-10-CM | POA: Diagnosis present

## 2022-09-03 DIAGNOSIS — R531 Weakness: Secondary | ICD-10-CM | POA: Diagnosis not present

## 2022-09-03 DIAGNOSIS — Z7401 Bed confinement status: Secondary | ICD-10-CM | POA: Diagnosis not present

## 2022-09-03 DIAGNOSIS — E538 Deficiency of other specified B group vitamins: Secondary | ICD-10-CM | POA: Diagnosis present

## 2022-09-03 DIAGNOSIS — Z7982 Long term (current) use of aspirin: Secondary | ICD-10-CM

## 2022-09-03 DIAGNOSIS — Z8042 Family history of malignant neoplasm of prostate: Secondary | ICD-10-CM

## 2022-09-03 DIAGNOSIS — F410 Panic disorder [episodic paroxysmal anxiety] without agoraphobia: Secondary | ICD-10-CM | POA: Diagnosis present

## 2022-09-03 DIAGNOSIS — G4710 Hypersomnia, unspecified: Secondary | ICD-10-CM | POA: Diagnosis present

## 2022-09-03 DIAGNOSIS — Z79890 Hormone replacement therapy (postmenopausal): Secondary | ICD-10-CM | POA: Diagnosis not present

## 2022-09-03 DIAGNOSIS — Z66 Do not resuscitate: Secondary | ICD-10-CM | POA: Diagnosis present

## 2022-09-03 DIAGNOSIS — Z9181 History of falling: Secondary | ICD-10-CM | POA: Diagnosis present

## 2022-09-03 DIAGNOSIS — K922 Gastrointestinal hemorrhage, unspecified: Secondary | ICD-10-CM | POA: Diagnosis not present

## 2022-09-03 DIAGNOSIS — M542 Cervicalgia: Secondary | ICD-10-CM | POA: Diagnosis present

## 2022-09-03 DIAGNOSIS — M810 Age-related osteoporosis without current pathological fracture: Secondary | ICD-10-CM | POA: Diagnosis not present

## 2022-09-03 DIAGNOSIS — R54 Age-related physical debility: Secondary | ICD-10-CM | POA: Diagnosis present

## 2022-09-03 DIAGNOSIS — M069 Rheumatoid arthritis, unspecified: Secondary | ICD-10-CM | POA: Diagnosis present

## 2022-09-03 DIAGNOSIS — R918 Other nonspecific abnormal finding of lung field: Secondary | ICD-10-CM | POA: Diagnosis not present

## 2022-09-03 DIAGNOSIS — M6281 Muscle weakness (generalized): Secondary | ICD-10-CM | POA: Diagnosis not present

## 2022-09-03 DIAGNOSIS — Z960 Presence of urogenital implants: Secondary | ICD-10-CM | POA: Diagnosis present

## 2022-09-03 DIAGNOSIS — Z1152 Encounter for screening for COVID-19: Secondary | ICD-10-CM

## 2022-09-03 DIAGNOSIS — R4182 Altered mental status, unspecified: Secondary | ICD-10-CM | POA: Diagnosis not present

## 2022-09-03 DIAGNOSIS — Z888 Allergy status to other drugs, medicaments and biological substances status: Secondary | ICD-10-CM

## 2022-09-03 DIAGNOSIS — I959 Hypotension, unspecified: Secondary | ICD-10-CM | POA: Diagnosis not present

## 2022-09-03 DIAGNOSIS — Z96642 Presence of left artificial hip joint: Secondary | ICD-10-CM | POA: Diagnosis present

## 2022-09-03 DIAGNOSIS — R41841 Cognitive communication deficit: Secondary | ICD-10-CM | POA: Diagnosis not present

## 2022-09-03 DIAGNOSIS — Z79891 Long term (current) use of opiate analgesic: Secondary | ICD-10-CM

## 2022-09-03 DIAGNOSIS — J690 Pneumonitis due to inhalation of food and vomit: Secondary | ICD-10-CM | POA: Diagnosis present

## 2022-09-03 DIAGNOSIS — Z887 Allergy status to serum and vaccine status: Secondary | ICD-10-CM

## 2022-09-03 DIAGNOSIS — N390 Urinary tract infection, site not specified: Secondary | ICD-10-CM | POA: Diagnosis present

## 2022-09-03 DIAGNOSIS — R059 Cough, unspecified: Secondary | ICD-10-CM | POA: Diagnosis not present

## 2022-09-03 DIAGNOSIS — Z8249 Family history of ischemic heart disease and other diseases of the circulatory system: Secondary | ICD-10-CM | POA: Diagnosis not present

## 2022-09-03 DIAGNOSIS — Z825 Family history of asthma and other chronic lower respiratory diseases: Secondary | ICD-10-CM

## 2022-09-03 DIAGNOSIS — I10 Essential (primary) hypertension: Secondary | ICD-10-CM | POA: Diagnosis present

## 2022-09-03 DIAGNOSIS — R1312 Dysphagia, oropharyngeal phase: Secondary | ICD-10-CM | POA: Diagnosis not present

## 2022-09-03 DIAGNOSIS — H9190 Unspecified hearing loss, unspecified ear: Secondary | ICD-10-CM | POA: Diagnosis present

## 2022-09-03 DIAGNOSIS — I517 Cardiomegaly: Secondary | ICD-10-CM | POA: Diagnosis not present

## 2022-09-03 DIAGNOSIS — R296 Repeated falls: Secondary | ICD-10-CM | POA: Diagnosis not present

## 2022-09-03 DIAGNOSIS — E890 Postprocedural hypothyroidism: Secondary | ICD-10-CM | POA: Diagnosis not present

## 2022-09-03 DIAGNOSIS — Z6824 Body mass index (BMI) 24.0-24.9, adult: Secondary | ICD-10-CM

## 2022-09-03 DIAGNOSIS — R519 Headache, unspecified: Secondary | ICD-10-CM | POA: Diagnosis present

## 2022-09-03 DIAGNOSIS — K219 Gastro-esophageal reflux disease without esophagitis: Secondary | ICD-10-CM | POA: Diagnosis present

## 2022-09-03 DIAGNOSIS — M79604 Pain in right leg: Secondary | ICD-10-CM | POA: Diagnosis present

## 2022-09-03 DIAGNOSIS — Z7189 Other specified counseling: Secondary | ICD-10-CM | POA: Diagnosis not present

## 2022-09-03 DIAGNOSIS — M79605 Pain in left leg: Secondary | ICD-10-CM | POA: Diagnosis present

## 2022-09-03 DIAGNOSIS — I6782 Cerebral ischemia: Secondary | ICD-10-CM | POA: Diagnosis not present

## 2022-09-03 DIAGNOSIS — W19XXXA Unspecified fall, initial encounter: Secondary | ICD-10-CM | POA: Diagnosis not present

## 2022-09-03 DIAGNOSIS — D649 Anemia, unspecified: Secondary | ICD-10-CM | POA: Diagnosis present

## 2022-09-03 DIAGNOSIS — Z79899 Other long term (current) drug therapy: Secondary | ICD-10-CM

## 2022-09-03 DIAGNOSIS — M7989 Other specified soft tissue disorders: Secondary | ICD-10-CM | POA: Diagnosis not present

## 2022-09-03 DIAGNOSIS — D62 Acute posthemorrhagic anemia: Secondary | ICD-10-CM | POA: Diagnosis present

## 2022-09-03 DIAGNOSIS — Z993 Dependence on wheelchair: Secondary | ICD-10-CM | POA: Diagnosis not present

## 2022-09-03 DIAGNOSIS — R410 Disorientation, unspecified: Secondary | ICD-10-CM | POA: Diagnosis not present

## 2022-09-03 DIAGNOSIS — J9611 Chronic respiratory failure with hypoxia: Secondary | ICD-10-CM | POA: Diagnosis not present

## 2022-09-03 DIAGNOSIS — R0902 Hypoxemia: Secondary | ICD-10-CM | POA: Diagnosis present

## 2022-09-03 DIAGNOSIS — J984 Other disorders of lung: Secondary | ICD-10-CM | POA: Diagnosis not present

## 2022-09-03 DIAGNOSIS — Z515 Encounter for palliative care: Secondary | ICD-10-CM

## 2022-09-03 DIAGNOSIS — R195 Other fecal abnormalities: Secondary | ICD-10-CM | POA: Diagnosis not present

## 2022-09-03 DIAGNOSIS — R911 Solitary pulmonary nodule: Secondary | ICD-10-CM | POA: Diagnosis present

## 2022-09-03 DIAGNOSIS — Z87891 Personal history of nicotine dependence: Secondary | ICD-10-CM

## 2022-09-03 DIAGNOSIS — R339 Retention of urine, unspecified: Secondary | ICD-10-CM | POA: Diagnosis present

## 2022-09-03 DIAGNOSIS — T39395A Adverse effect of other nonsteroidal anti-inflammatory drugs [NSAID], initial encounter: Secondary | ICD-10-CM | POA: Diagnosis present

## 2022-09-03 DIAGNOSIS — R350 Frequency of micturition: Secondary | ICD-10-CM | POA: Diagnosis not present

## 2022-09-03 DIAGNOSIS — R0602 Shortness of breath: Secondary | ICD-10-CM | POA: Diagnosis not present

## 2022-09-03 DIAGNOSIS — Z7952 Long term (current) use of systemic steroids: Secondary | ICD-10-CM

## 2022-09-03 DIAGNOSIS — D84821 Immunodeficiency due to drugs: Secondary | ICD-10-CM | POA: Diagnosis present

## 2022-09-03 DIAGNOSIS — Z91041 Radiographic dye allergy status: Secondary | ICD-10-CM

## 2022-09-03 DIAGNOSIS — J188 Other pneumonia, unspecified organism: Secondary | ICD-10-CM | POA: Diagnosis not present

## 2022-09-03 DIAGNOSIS — Z466 Encounter for fitting and adjustment of urinary device: Secondary | ICD-10-CM | POA: Diagnosis not present

## 2022-09-03 DIAGNOSIS — B351 Tinea unguium: Secondary | ICD-10-CM | POA: Diagnosis present

## 2022-09-03 DIAGNOSIS — Z79631 Long term (current) use of antimetabolite agent: Secondary | ICD-10-CM

## 2022-09-03 DIAGNOSIS — J449 Chronic obstructive pulmonary disease, unspecified: Secondary | ICD-10-CM | POA: Diagnosis present

## 2022-09-03 DIAGNOSIS — M549 Dorsalgia, unspecified: Secondary | ICD-10-CM | POA: Diagnosis present

## 2022-09-03 DIAGNOSIS — R1110 Vomiting, unspecified: Secondary | ICD-10-CM | POA: Diagnosis present

## 2022-09-03 DIAGNOSIS — R300 Dysuria: Secondary | ICD-10-CM | POA: Diagnosis present

## 2022-09-03 LAB — CREATININE, SERUM
Creatinine, Ser: 0.77 mg/dL (ref 0.44–1.00)
GFR, Estimated: >60 mL/min (ref >60)

## 2022-09-03 LAB — BASIC METABOLIC PANEL
Anion gap: 10 (ref 5–15)
BUN: 14 mg/dL (ref 8–23)
CO2: 23 mmol/L (ref 22–32)
Calcium: 8.3 mg/dL — ABNORMAL LOW (ref 8.9–10.3)
Chloride: 100 mmol/L (ref 98–111)
Creatinine, Ser: 0.82 mg/dL (ref 0.44–1.00)
GFR, Estimated: >60 mL/min (ref >60)
Glucose, Bld: 95 mg/dL (ref 70–99)
Potassium: 4.3 mmol/L (ref 3.5–5.1)
Sodium: 133 mmol/L — ABNORMAL LOW (ref 135–145)

## 2022-09-03 LAB — URINALYSIS, ROUTINE W REFLEX MICROSCOPIC
Bilirubin Urine: NEGATIVE
Glucose, UA: NEGATIVE mg/dL
Hgb urine dipstick: NEGATIVE
Ketones, ur: 5 mg/dL — AB
Leukocytes,Ua: NEGATIVE
Nitrite: NEGATIVE
Protein, ur: NEGATIVE mg/dL
Specific Gravity, Urine: 1.015 (ref 1.005–1.030)
pH: 5 (ref 5.0–8.0)

## 2022-09-03 LAB — HEPATIC FUNCTION PANEL
ALT: 16 U/L (ref 0–44)
AST: 38 U/L (ref 15–41)
Albumin: 2.8 g/dL — ABNORMAL LOW (ref 3.5–5.0)
Alkaline Phosphatase: 65 U/L (ref 38–126)
Bilirubin, Direct: 0.3 mg/dL — ABNORMAL HIGH (ref 0.0–0.2)
Indirect Bilirubin: 0.3 mg/dL (ref 0.3–0.9)
Total Bilirubin: 0.6 mg/dL (ref 0.3–1.2)
Total Protein: 6.5 g/dL (ref 6.5–8.1)

## 2022-09-03 LAB — CBC
HCT: 25.2 % — ABNORMAL LOW (ref 36.0–46.0)
HCT: 24.5 % — ABNORMAL LOW (ref 36.0–46.0)
Hemoglobin: 7.5 g/dL — ABNORMAL LOW (ref 12.0–15.0)
Hemoglobin: 7.4 g/dL — ABNORMAL LOW (ref 12.0–15.0)
MCH: 27.2 pg (ref 26.0–34.0)
MCH: 26.8 pg (ref 26.0–34.0)
MCHC: 29.8 g/dL — ABNORMAL LOW (ref 30.0–36.0)
MCHC: 30.2 g/dL (ref 30.0–36.0)
MCV: 91.3 fL (ref 80.0–100.0)
MCV: 88.8 fL (ref 80.0–100.0)
Platelets: 600 10*3/uL — ABNORMAL HIGH (ref 150–400)
Platelets: 532 10*3/uL — ABNORMAL HIGH (ref 150–400)
RBC: 2.76 MIL/uL — ABNORMAL LOW (ref 3.87–5.11)
RBC: 2.76 MIL/uL — ABNORMAL LOW (ref 3.87–5.11)
RDW: 19.4 % — ABNORMAL HIGH (ref 11.5–15.5)
RDW: 19.4 % — ABNORMAL HIGH (ref 11.5–15.5)
WBC: 8.3 10*3/uL (ref 4.0–10.5)
WBC: 7.5 10*3/uL (ref 4.0–10.5)
nRBC: 0 % (ref 0.0–0.2)
nRBC: 0 % (ref 0.0–0.2)

## 2022-09-03 LAB — LACTATE DEHYDROGENASE: LDH: 308 U/L — ABNORMAL HIGH (ref 98–192)

## 2022-09-03 LAB — RETICULOCYTES
Immature Retic Fract: 30.4 % — ABNORMAL HIGH (ref 2.3–15.9)
RBC.: 2.69 MIL/uL — ABNORMAL LOW (ref 3.87–5.11)
Retic Count, Absolute: 82.9 10*3/uL (ref 19.0–186.0)
Retic Ct Pct: 3.1 % (ref 0.4–3.1)

## 2022-09-03 LAB — POC OCCULT BLOOD, ED: Fecal Occult Bld: POSITIVE — AB

## 2022-09-03 LAB — TSH: TSH: 20.963 u[IU]/mL — ABNORMAL HIGH (ref 0.350–4.500)

## 2022-09-03 MED ORDER — OXYCODONE-ACETAMINOPHEN 5-325 MG PO TABS
1.0000 | ORAL_TABLET | Freq: Four times a day (QID) | ORAL | Status: DC | PRN
  Administered 2022-09-03: 1 via ORAL
  Filled 2022-09-03: qty 1

## 2022-09-03 MED ORDER — ENOXAPARIN SODIUM 40 MG/0.4ML IJ SOSY
40.0000 mg | PREFILLED_SYRINGE | INTRAMUSCULAR | Status: DC
  Administered 2022-09-04: 40 mg via SUBCUTANEOUS
  Filled 2022-09-03: qty 0.4

## 2022-09-03 MED ORDER — ACETAMINOPHEN 650 MG RE SUPP: 650.0000 mg | Freq: Four times a day (QID) | RECTAL | Status: DC | PRN

## 2022-09-03 MED ORDER — FENTANYL 25 MCG/HR TD PT72
1.0000 | MEDICATED_PATCH | TRANSDERMAL | Status: DC
  Administered 2022-09-03: 1 via TRANSDERMAL
  Filled 2022-09-03: qty 1

## 2022-09-03 MED ORDER — AMLODIPINE BESYLATE 5 MG PO TABS
5.0000 mg | ORAL_TABLET | Freq: Every day | ORAL | Status: DC
  Administered 2022-09-04 – 2022-09-09 (×5): 5 mg via ORAL
  Filled 2022-09-03 (×5): qty 1

## 2022-09-03 MED ORDER — LEVOTHYROXINE SODIUM 25 MCG PO TABS
125.0000 ug | ORAL_TABLET | Freq: Every day | ORAL | Status: DC
  Administered 2022-09-04 – 2022-09-09 (×5): 125 ug via ORAL
  Filled 2022-09-03 (×5): qty 1

## 2022-09-03 MED ORDER — BENAZEPRIL HCL 20 MG PO TABS
20.0000 mg | ORAL_TABLET | Freq: Every morning | ORAL | Status: DC
  Administered 2022-09-04 – 2022-09-09 (×5): 20 mg via ORAL
  Filled 2022-09-03 (×6): qty 1

## 2022-09-03 MED ORDER — VITAMIN D 25 MCG (1000 UNIT) PO TABS
2000.0000 [IU] | ORAL_TABLET | Freq: Every day | ORAL | Status: DC
  Administered 2022-09-04 – 2022-09-09 (×5): 2000 [IU] via ORAL
  Filled 2022-09-03 (×5): qty 2

## 2022-09-03 MED ORDER — SODIUM CHLORIDE 0.9 % IV SOLN
3.0000 g | Freq: Four times a day (QID) | INTRAVENOUS | Status: DC
  Administered 2022-09-03 – 2022-09-08 (×19): 3 g via INTRAVENOUS
  Filled 2022-09-03 (×21): qty 8

## 2022-09-03 MED ORDER — OXYCODONE HCL 5 MG PO TABS
5.0000 mg | ORAL_TABLET | Freq: Four times a day (QID) | ORAL | Status: DC | PRN
  Administered 2022-09-03 – 2022-09-09 (×7): 5 mg via ORAL
  Filled 2022-09-03 (×7): qty 1

## 2022-09-03 MED ORDER — ASPIRIN 81 MG PO TBEC
81.0000 mg | DELAYED_RELEASE_TABLET | Freq: Every day | ORAL | Status: DC
  Administered 2022-09-04 – 2022-09-09 (×5): 81 mg via ORAL
  Filled 2022-09-03 (×5): qty 1

## 2022-09-03 MED ORDER — SODIUM CHLORIDE 0.9 % IV SOLN
1.0000 g | INTRAVENOUS | Status: DC
  Administered 2022-09-03: 1 g via INTRAVENOUS
  Filled 2022-09-03: qty 10

## 2022-09-03 MED ORDER — SENNOSIDES-DOCUSATE SODIUM 8.6-50 MG PO TABS
2.0000 | ORAL_TABLET | Freq: Two times a day (BID) | ORAL | Status: DC
  Administered 2022-09-03 – 2022-09-06 (×7): 2 via ORAL
  Filled 2022-09-03 (×8): qty 2

## 2022-09-03 MED ORDER — PANTOPRAZOLE SODIUM 40 MG PO TBEC
40.0000 mg | DELAYED_RELEASE_TABLET | Freq: Every day | ORAL | Status: DC
  Administered 2022-09-04: 40 mg via ORAL
  Filled 2022-09-03: qty 1

## 2022-09-03 MED ORDER — OXYCODONE-ACETAMINOPHEN 10-325 MG PO TABS: 1.0000 | ORAL_TABLET | Freq: Four times a day (QID) | ORAL | Status: DC | PRN

## 2022-09-03 MED ORDER — ACETAMINOPHEN 325 MG PO TABS
650.0000 mg | ORAL_TABLET | Freq: Four times a day (QID) | ORAL | Status: DC | PRN
  Administered 2022-09-04: 650 mg via ORAL
  Filled 2022-09-03: qty 2

## 2022-09-03 MED ORDER — POLYETHYLENE GLYCOL 3350 17 G PO PACK: 17.0000 g | PACK | Freq: Every day | ORAL | Status: DC | PRN

## 2022-09-03 MED ORDER — METHOCARBAMOL 500 MG PO TABS: 500.0000 mg | ORAL_TABLET | Freq: Two times a day (BID) | ORAL | Status: DC | PRN

## 2022-09-03 MED ORDER — LORAZEPAM 0.5 MG PO TABS
0.2500 mg | ORAL_TABLET | Freq: Two times a day (BID) | ORAL | Status: DC
  Administered 2022-09-03 – 2022-09-09 (×11): 0.25 mg via ORAL
  Filled 2022-09-03 (×11): qty 1

## 2022-09-03 MED ORDER — SODIUM CHLORIDE 0.9% FLUSH
3.0000 mL | Freq: Two times a day (BID) | INTRAVENOUS | Status: DC
  Administered 2022-09-03 – 2022-09-09 (×9): 3 mL via INTRAVENOUS

## 2022-09-03 MED ORDER — PREDNISONE 5 MG PO TABS
5.0000 mg | ORAL_TABLET | Freq: Every day | ORAL | Status: DC
  Administered 2022-09-04 – 2022-09-09 (×5): 5 mg via ORAL
  Filled 2022-09-03 (×5): qty 1

## 2022-09-03 MED ORDER — THIAMINE HCL 100 MG/ML IJ SOLN
100.0000 mg | Freq: Every day | INTRAMUSCULAR | Status: DC
  Administered 2022-09-03 – 2022-09-05 (×3): 100 mg via INTRAVENOUS
  Filled 2022-09-03 (×3): qty 2

## 2022-09-03 MED ORDER — ALBUTEROL SULFATE (2.5 MG/3ML) 0.083% IN NEBU
2.5000 mg | INHALATION_SOLUTION | RESPIRATORY_TRACT | Status: DC | PRN
  Administered 2022-09-04 – 2022-09-09 (×5): 2.5 mg via RESPIRATORY_TRACT
  Filled 2022-09-03 (×5): qty 3

## 2022-09-03 MED ORDER — SODIUM CHLORIDE 0.9 % IV SOLN
500.0000 mg | INTRAVENOUS | Status: AC
  Administered 2022-09-03 – 2022-09-07 (×5): 500 mg via INTRAVENOUS
  Filled 2022-09-03 (×6): qty 5

## 2022-09-03 MED ORDER — SERTRALINE HCL 50 MG PO TABS
50.0000 mg | ORAL_TABLET | Freq: Every day | ORAL | Status: DC
  Administered 2022-09-03 – 2022-09-09 (×6): 50 mg via ORAL
  Filled 2022-09-03 (×6): qty 1

## 2022-09-03 NOTE — ED Notes (Signed)
Informed hospitalist of positive POC occult blood.

## 2022-09-03 NOTE — Assessment & Plan Note (Signed)
Hold methotrexate, continue with prednisone 5 mg daily.  Patient should get stress dose steroids in case of any vital instability.

## 2022-09-03 NOTE — Assessment & Plan Note (Addendum)
She is on 2L home O2 but has had worsening SOB for several weeks that worsened after she vomited recently Negative COVID and RVP testing Some concern for aspiration, SLP evaluation with only mild aspiration risk Given ceftriaxone and azithromycin in the ER, changed to Unasyn and azithromycin.   She is on methotrexate and prednisone and so is immunocompromised as well Increased respiratory distress overnight on 8/30, placed on NRB; has been weaned back to 4L [place] O2

## 2022-09-03 NOTE — Progress Notes (Signed)
PHARMACY NOTE -  Unasyn  Pharmacy has been assisting with dosing of Unasyn for aspiration PNA. Dosage remains stable at 3g IV q6 hr and further renal adjustments per institutional Pharmacy antibiotic protocol  Pharmacy will sign off, following peripherally for culture results, dose adjustments, and length of therapy. Please reconsult if a change in clinical status warrants re-evaluation of dosage.  Bernadene Person, PharmD, BCPS [phone] 09/03/2022, 8:19 PM

## 2022-09-03 NOTE — Assessment & Plan Note (Signed)
At this time we will have to defer further workup in this regard.  I think a colonoscopy discussion could potentially be had after the patient is discharged from the hospital.

## 2022-09-03 NOTE — Assessment & Plan Note (Addendum)
Patient with Hgb 9.6 in May, on admission 7.5 -> 7.1 and heme positive In talking with her daughter, primary concerns were AMS, DOE, and fatigue - c/w symptomatic anemia Concern for GI bleed, but given her age and frailty, further evaluation may not be reasonable (see below) Family amenable to transfusion Transfused 1 unit PRBC  Hgb improved to 8.3 and patient dramatically improved clinically Hgb this AM also 8.3 but she has been very somnolent all day She has had 2 large BMs today, both formed but also both dark Recheck CBC now and transfuse if needed for Hgb <7

## 2022-09-03 NOTE — ED Notes (Signed)
ED TO INPATIENT HANDOFF REPORT  ED Nurse Name and Phone #:  Alphonzo Lemmings, RN   S Name/Age/Gender Jocelyn Bauer 89 y.o. female Room/Bed: WA20/WA20  Code Status   Code Status: DNR  Home/SNF/Other Home Patient oriented to: self and place Is this baseline? No   Triage Complete: Triage complete  Chief Complaint Aspiration pneumonitis [facility]) [J69.0]  Triage Note Pt arrived by ambulance. Lives home with family. Patient has been having frequent urination and been confused. Family states this is how she presents when she has a UTI. They called her pcp recently and had her started on Macrobid. One pill left. AAOX2 at this time.    Allergies Allergies  Allergen Reactions   Contrast Media [Iodinated Contrast Media] Other (See Comments)    Itching.( 13 hr pre medication regimen worked fine. )   Tdap [Tetanus-Diphth-Acell Pertussis] Hives   Cholestyramine     Other reaction(s): stomach felt too full.   Duloxetine Hcl     Other reaction(s): didn't sleep  but just took one pill   Etidronate     Other reaction(s): diarrhea   Ezetimibe-Simvastatin     Other reaction(s): Unknown   Hydromorphone     Other reaction(s): Unknown   Pravastatin     Other reaction(s): with daily dose just hurt more.   Streptococcus (Diplococcus) Pneumoniae [Streptococci] Other (See Comments)    Shortness of breath    Level of Care/Admitting Diagnosis ED Disposition     ED Disposition  Admit   Condition  --   Comment  Hospital Area: [facility] [[id number]]  Level of Care: Progressive [102]  Admit to Progressive based on following criteria: RESPIRATORY PROBLEMS hypoxemic/hypercapnic respiratory failure that is responsive to NIPPV (BiPAP) or High Flow Nasal Cannula (6-80 lpm). Frequent assessment/intervention, no > Q2 hrs < Q4 hrs, to maintain oxygenation and pulmonary hygiene.  May admit patient to [facility] or [facility] if equivalent level of care is available:: No  Covid Evaluation:  Symptomatic Person Under Investigation (PUI) or recent exposure (last 10 days) *Testing Required*  Diagnosis: Aspiration pneumonitis [facility]) [[id number]]  Admitting Physician: Nolberto Hanlon [[id number]]  Attending Physician: Nolberto Hanlon [[id number]]  Certification:: I certify this patient will need inpatient services for at least 2 midnights  Expected Medical Readiness: 09/06/2022          B Medical/Surgery History Past Medical History:  Diagnosis Date   Anxiety    HX OF PANIC ATTACKS   Arthritis    RA   Bilateral ankle fractures 07/26/2019   Chronic neck and back pain    Chronic pain    Chronic, continuous use of opioids 07/28/2019   Elbow locking    due to RA- elbow with limited extension   GERD (gastroesophageal reflux disease)    Headache    Hyperlipidemia    Hypertension    Hypothyroidism    Osteoarthritis    Osteoporosis    Pneumonia    Shortness of breath    occas   UTI (urinary tract infection)    Past Surgical History:  Procedure Laterality Date   CATARACT EXTRACTION Bilateral    COLONOSCOPY     ELBOW SURGERY Right    ELBOW SURGERY Left    ESOPHAGOGASTRODUODENOSCOPY     HARDWARE REMOVAL Right 07/13/2015   Procedure:  WITH HARDWARE REMOVAL REPAIR AS NECESSARY;  Surgeon: Dominica Severin, MD;  Location: [facility] OR;  Service: Orthopedics;  Laterality: Right;   HARDWARE REMOVAL Right 01/22/2021   Procedure: HARDWARE REMOVAL;  Surgeon: Carola Frost,  Casimiro Needle, MD;  Location: [facility] OR;  Service: Orthopedics;  Laterality: Right;   JOINT REPLACEMENT  1998   left hip replacement and bilateral elbow replacements  about 15 yrs ago   ORIF ANKLE FRACTURE Bilateral 07/27/2019   Procedure: OPEN REDUCTION INTERNAL FIXATION (ORIF) ANKLE FRACTURE;  Surgeon: Myrene Galas, MD;  Location: [facility] OR;  Service: Orthopedics;  Laterality: Bilateral;   thyroidectomy     TOTAL ELBOW ARTHROPLASTY Right 07/13/2015   Procedure: RIGHT ELBOW REVISION TOTAL ELBOW ARTHROPLASTY ;  Surgeon: Dominica Severin, MD;  Location: [facility] OR;   Service: Orthopedics;  Laterality: Right;   TOTAL HIP ARTHROPLASTY     TOTAL HIP REVISION  03/11/2011   Procedure: TOTAL HIP REVISION;  Surgeon: [name], MD;  Location: [facility] ORS;  Service: Orthopedics;  Laterality: Left;   TRICEPS TENDON REPAIR Right 07/13/2015   Procedure:  TRICEP RECONSTRUCTION REPAIR WITH ALLOGRAFT, ULNAR NERVE NEUROLYSIS;  Surgeon: Dominica Severin, MD;  Location: [facility] OR;  Service: Orthopedics;  Laterality: Right;     A IV Location/Drains/Wounds Patient Lines/Drains/Airways Status     Active Line/Drains/Airways     Name Placement date Placement time Site Days   Peripheral IV 09/03/22 22 G 1" Left;Posterior Forearm 09/03/22  1326  Forearm  less than 1            Intake/Output Last 24 hours No intake or output data in the 24 hours ending 09/03/22 1958  Labs/Imaging Results for orders placed or performed during the hospital encounter of 09/03/22 (from the past 48 hour(s))  CBC     Status: Abnormal   Collection Time: 09/03/22  2:52 PM  Result Value Ref Range   WBC 8.3 4.0 - 10.5 K/uL   RBC 2.76 (L) 3.87 - 5.11 MIL/uL   Hemoglobin 7.5 (L) 12.0 - 15.0 g/dL   HCT 29.5 (L) 18.8 - 41.6 %   MCV 91.3 80.0 - 100.0 fL   MCH 27.2 26.0 - 34.0 pg   MCHC 29.8 (L) 30.0 - 36.0 g/dL   RDW 60.6 (H) 30.1 - 60.1 %   Platelets 600 (H) 150 - 400 K/uL   nRBC 0.0 0.0 - 0.2 %    Comment: Performed at [facility], [address] [place], [place] [address]  Basic metabolic panel     Status: Abnormal   Collection Time: 09/03/22  2:52 PM  Result Value Ref Range   Sodium 133 (L) 135 - 145 mmol/L   Potassium 4.3 3.5 - 5.1 mmol/L    Comment: HEMOLYSIS AT THIS LEVEL MAY AFFECT RESULT SLIGHT HEMOLYSIS PRESENT    Chloride 100 98 - 111 mmol/L   CO2 23 22 - 32 mmol/L   Glucose, Bld 95 70 - 99 mg/dL    Comment: Glucose reference range applies only to samples taken after fasting for at least 8 hours.   BUN 14 8 - 23 mg/dL   Creatinine, Ser 5.57 0.44 - 1.00 mg/dL    Calcium 8.3 (L) 8.9 - 10.3 mg/dL   GFR, Estimated >32 >20 mL/min    Comment: (NOTE) Calculated using the CKD-EPI Creatinine Equation (2021)    Anion gap 10 5 - 15    Comment: Performed at [facility], [address] [place], [place] [address]  Hepatic function panel     Status: Abnormal   Collection Time: 09/03/22  2:52 PM  Result Value Ref Range   Total Protein 6.5 6.5 - 8.1 g/dL   Albumin 2.8 (L) 3.5 - 5.0 g/dL  AST 38 15 - 41 U/L    Comment: HEMOLYSIS AT THIS LEVEL MAY AFFECT RESULT SLIGHT HEMOLYSIS PRESENT    ALT 16 0 - 44 U/L    Comment: HEMOLYSIS AT THIS LEVEL MAY AFFECT RESULT SLIGHT HEMOLYSIS PRESENT    Alkaline Phosphatase 65 38 - 126 U/L   Total Bilirubin 0.6 0.3 - 1.2 mg/dL    Comment: HEMOLYSIS AT THIS LEVEL MAY AFFECT RESULT SLIGHT HEMOLYSIS PRESENT    Bilirubin, Direct 0.3 (H) 0.0 - 0.2 mg/dL   Indirect Bilirubin 0.3 0.3 - 0.9 mg/dL    Comment: Performed at [facility], [address], [place], [place] [address]  Urinalysis, Routine w reflex microscopic -Urine, Clean Catch     Status: Abnormal   Collection Time: 09/03/22  4:09 PM  Result Value Ref Range   Color, Urine YELLOW YELLOW   APPearance CLEAR CLEAR   Specific Gravity, Urine 1.015 1.005 - 1.030   pH 5.0 5.0 - 8.0   Glucose, UA NEGATIVE NEGATIVE mg/dL   Hgb urine dipstick NEGATIVE NEGATIVE   Bilirubin Urine NEGATIVE NEGATIVE   Ketones, ur 5 (A) NEGATIVE mg/dL   Protein, ur NEGATIVE NEGATIVE mg/dL   Nitrite NEGATIVE NEGATIVE   Leukocytes,Ua NEGATIVE NEGATIVE    Comment: Performed at [facility], [address] [place], [place] [address]  POC occult blood, ED     Status: Abnormal   Collection Time: 09/03/22  4:38 PM  Result Value Ref Range   Fecal Occult Bld POSITIVE (A) NEGATIVE   DG Chest 2 View  Result Date: 09/03/2022 CLINICAL DATA:  Cough, frequent urination, confusion EXAM: CHEST - 2 VIEW COMPARISON:  05/26/2022 FINDINGS:  Frontal and lateral views of the chest demonstrate an unremarkable cardiac silhouette. There is bilateral airspace disease most pronounced within the right upper lobe, which may reflect asymmetric edema or multifocal bronchopneumonia. No effusion or pneumothorax. No acute bony abnormalities. IMPRESSION: 1. Bilateral airspace disease greatest in the right upper lobe, which could reflect multifocal bronchopneumonia or asymmetric edema. Electronically Signed   By: Sharlet Salina M.D.   On: 09/03/2022 18:14   CT ABDOMEN PELVIS WO CONTRAST  Result Date: 09/03/2022 CLINICAL DATA:  Increased urinary frequency and altered mental status EXAM: CT ABDOMEN AND PELVIS WITHOUT CONTRAST TECHNIQUE: Multidetector CT imaging of the abdomen and pelvis was performed following the standard protocol without IV contrast. RADIATION DOSE REDUCTION: This exam was performed according to the departmental dose-optimization program which includes automated exposure control, adjustment of the mA and/or kV according to patient size and/or use of iterative reconstruction technique. COMPARISON:  CT abdomen and pelvis dated 05/26/2022 and multiple priors FINDINGS: Lower chest: Left basilar nodule measured 8 x 6 mm (7:22), unchanged from 11/02/2021 but slightly enlarged in size from 5 x 4 mm on 03/31/2014. Partially imaged bibasilar scarring and traction bronchiectasis. No pleural effusion or pneumothorax demonstrated. Partially imaged heart size is normal. Coronary artery calcifications. Hepatobiliary: No focal hepatic lesions. No intra or extrahepatic biliary ductal dilation. Normal gallbladder. Pancreas: Diffuse parenchymal atrophy.  No main ductal dilation. Spleen: Normal in size without focal abnormality. Adrenals/Urinary Tract: No adrenal nodules. No suspicious renal mass by noncontrast technique or hydronephrosis. Punctate nonobstructing right interpolar stone. No focal bladder wall thickening. Stomach/Bowel: Moderate hiatal hernia. Normal  appearance of the stomach. No evidence of bowel wall thickening, distention, or inflammatory changes. Appendix is not discretely seen. Vascular/Lymphatic: Aortic atherosclerosis. No enlarged abdominal or pelvic lymph nodes. Reproductive: No adnexal masses. Other: No free fluid, fluid  collection, or free air. Musculoskeletal: No acute or abnormal lytic or blastic osseous lesions. Multilevel degenerative changes of the partially imaged thoracic and lumbar spine. Unchanged inferior endplate compression deformity of L4. Partially imaged left hip arthroplasty. Old fracture of left inferior pubic ramus. IMPRESSION: 1. No acute abnormality in the abdomen or pelvis. 2. Punctate nonobstructing right interpolar renal stone. 3. Left basilar lung nodule measures 8 x 6 mm, unchanged from 11/02/2021 but slightly enlarged in size from 5 x 4 mm on 03/31/2014. Non-contrast chest CT at 6-12 months is recommended. If the nodule is stable at time of repeat CT, then future CT at 18-24 months (from today's scan) is considered optional for low-risk patients, but is recommended for high-risk patients. This recommendation follows the consensus statement: Guidelines for Management of Incidental Pulmonary Nodules Detected on CT Images: From the Fleischner Society 2017; Radiology 2017; 284:228-243. 4. Moderate hiatal hernia. 5. Aortic Atherosclerosis (ICD10-I70.0). Coronary artery calcifications. Assessment for potential risk factor modification, dietary therapy or pharmacologic therapy may be warranted, if clinically indicated. Electronically Signed   By: Agustin Cree M.D.   On: 09/03/2022 17:59   CT Head Wo Contrast  Result Date: 09/03/2022 CLINICAL DATA:  Provided history: Altered mental status, nontraumatic. Frequent urination. Confusion. EXAM: CT HEAD WITHOUT CONTRAST TECHNIQUE: Contiguous axial images were obtained from the base of the skull through the vertex without intravenous contrast. RADIATION DOSE REDUCTION: This exam was  performed according to the departmental dose-optimization program which includes automated exposure control, adjustment of the mA and/or kV according to patient size and/or use of iterative reconstruction technique. COMPARISON:  Head CT 07/26/2019. FINDINGS: Brain: Generalized parenchymal atrophy. Patchy and ill-defined hypoattenuation within the cerebral white matter, nonspecific but compatible with moderate chronic small vessel ischemic disease. There is no acute intracranial hemorrhage. No demarcated cortical infarct. No extra-axial fluid collection. No evidence of an intracranial mass. No midline shift. Vascular: No hyperdense vessel.  Atherosclerotic calcifications. Skull: No calvarial fracture or aggressive osseous lesion. Sinuses/Orbits: No mass or acute finding within the imaged orbits. Minimal mucosal thickening within the left maxillary sinus at the imaged levels. Other: Small-Volume fluid within the bilateral mastoid air cells. IMPRESSION: 1. No evidence of an acute intracranial abnormality. 2. Moderate chronic small vessel ischemic changes within the cerebral white matter. 3. Generalized parenchymal atrophy. 4. Small-volume fluid within the bilateral mastoid air cells. Electronically Signed   By: Jackey Loge D.O.   On: 09/03/2022 17:46    Pending Labs Unresulted Labs (From admission, onward)     Start     Ordered   09/10/22 0500  Creatinine, serum  (enoxaparin (LOVENOX)    CrCl >/= 30 ml/min)  Weekly,   R     Comments: while on enoxaparin therapy    09/03/22 1951   09/04/22 0500  APTT  Tomorrow morning,   R        09/03/22 1951   09/04/22 0500  Protime-INR  Tomorrow morning,   R        09/03/22 1951   09/04/22 0500  Basic metabolic panel  Tomorrow morning,   R        09/03/22 1951   09/04/22 0500  CBC  Tomorrow morning,   R        09/03/22 1951   09/03/22 1951  CBC  (enoxaparin (LOVENOX)    CrCl >/= 30 ml/min)  Once,   R       Comments: Baseline for enoxaparin therapy IF NOT ALREADY  DRAWN.  Notify MD  if PLT < 100 K.    09/03/22 1951   09/03/22 1951  Creatinine, serum  (enoxaparin (LOVENOX)    CrCl >/= 30 ml/min)  Once,   R       Comments: Baseline for enoxaparin therapy IF NOT ALREADY DRAWN.    09/03/22 1951   09/03/22 1942  SARS Coronavirus 2 by RT PCR (hospital order, performed in [facility] hospital lab) *cepheid single result test* Anterior Nasal Swab  (Tier 2 - SARS Coronavirus 2 by RT PCR (hospital order, performed in [facility] hospital lab) *cepheid single result test*)  Once,   URGENT        09/03/22 1941   09/03/22 1942  Respiratory (~20 pathogens) panel by PCR  (Respiratory panel by PCR (~20 pathogens, ~24 hr TAT)  w precautions)  Once,   URGENT        09/03/22 1941            Vitals/Pain Today's Vitals   09/03/22 1600 09/03/22 1802 09/03/22 1900 09/03/22 1952  BP: 121/81  (!) 139/94   Pulse: 94  83 83  Resp: 19  19   Temp:  98.4 F (36.9 C)    TempSrc:  Oral    SpO2: 91%  93% 96%  Weight:      Height:      PainSc:        Isolation Precautions Droplet precaution  Medications Medications  cefTRIAXone (ROCEPHIN) 1 g in sodium chloride 0.9 % 100 mL IVPB (1 g Intravenous New Bag/Given 09/03/22 1932)  azithromycin (ZITHROMAX) 500 mg in sodium chloride 0.9 % 250 mL IVPB (has no administration in time range)  enoxaparin (LOVENOX) injection 40 mg (has no administration in time range)  acetaminophen (TYLENOL) tablet 650 mg (has no administration in time range)    Or  acetaminophen (TYLENOL) suppository 650 mg (has no administration in time range)  polyethylene glycol (MIRALAX / GLYCOLAX) packet 17 g (has no administration in time range)  sodium chloride flush (NS) 0.9 % injection 3 mL (has no administration in time range)    Mobility walks with device     Focused Assessments    R Recommendations: See Admitting Provider Note  Report given to:   Additional Notes:  Pt using the bedpan. Pt a&o x2. Family at bedside. Pt lives at home  with daughter.

## 2022-09-03 NOTE — Assessment & Plan Note (Signed)
Increased size. Will need outpatient work up in this regard.

## 2022-09-03 NOTE — ED Triage Notes (Signed)
Pt arrived by ambulance. Lives home with family. Patient has been having frequent urination and been confused. Family states this is how she presents when she has a UTI. They called her pcp recently and had her started on Macrobid. One pill left. AAOX2 at this time.

## 2022-09-03 NOTE — Assessment & Plan Note (Addendum)
Reports chronic episodes of patient having confusion with various infections.   Currently decompensated, possibly from infection and/or symptomatic anemia CT head is negative Hold narcotics as much as possible Taper ativan to 0.25 mg bid. I would favor ceasing this medication over the long term. Add delirium precautions Significantly improved with halt in chronic pain medications as well as post-transfusion on 8/29 Recurrent hypersomnolence since

## 2022-09-03 NOTE — H&P (Addendum)
History and Physical    Patient: Jocelyn Bauer UXN:[id number] DOB: 22-Aug-1928 DOA: 09/03/2022 DOS: the patient was seen and examined on 09/03/2022 PCP: Rodrigo Ran, MD  Patient coming from: Home  Chief Complaint:  Chief Complaint  Patient presents with   Altered Mental Status   HPI: Jocelyn Bauer is a 89 y.o. female with medical history significant of chronic deconditioned status.  Patient is mostly wheelchair-bound.  History is obtained from daughter Sherette Moschella.  Patient was in her usual state of health till about a week ago when she started complaining of burning micturition.  At the time it was noted that the patient was having progressive difficulty recalling recent events.  Typically she is able to recall events at least 24 hours ago.  She was started on Bactrim.  Unfortunately patient's mentation continued to worsen and more recently patient has been seen talking to people in front of her that others cannot see.  As she has been hard to direct as well.  Patient had 2 episodes of vomiting 2 days ago.  Since then the vomiting has resolved and patient has tolerated p.o. diet.  She is not constipated or having diarrhea not complaining of any abdominal or suprapubic pain.  She is also been come noted to have cough for the last 3 days and has been complaining more of her chronic bilateral leg pain that is attributed to osteo-/rheumatoid arthritis.  Patient has become progressively weak over the last 7 days but especially over the last 48 hours prompting the family to bring the patient to the hospital.  The lowest sat recorded on room air in the ER is 85% on room air.  Patient has been stabilized on 2 L/min of supplementary oxygen since.  Patient has been noted to interacting with people not in the room.  And generally confused.  Patient is hard of hearing, at this time patient does not offer any complaints to the author. Review of Systems: unable to review all systems due to the inability  of the patient to answer questions.  Review of systems from the daughter was negative Past Medical History:  Diagnosis Date   Anxiety    HX OF PANIC ATTACKS   Arthritis    RA   Bilateral ankle fractures 07/26/2019   Chronic neck and back pain    Chronic pain    Chronic, continuous use of opioids 07/28/2019   Elbow locking    due to RA- elbow with limited extension   GERD (gastroesophageal reflux disease)    Headache    Hyperlipidemia    Hypertension    Hypothyroidism    Osteoarthritis    Osteoporosis    Pneumonia    Shortness of breath    occas   UTI (urinary tract infection)    Past Surgical History:  Procedure Laterality Date   CATARACT EXTRACTION Bilateral    COLONOSCOPY     ELBOW SURGERY Right    ELBOW SURGERY Left    ESOPHAGOGASTRODUODENOSCOPY     HARDWARE REMOVAL Right 07/13/2015   Procedure:  WITH HARDWARE REMOVAL REPAIR AS NECESSARY;  Surgeon: Dominica Severin, MD;  Location: [facility] OR;  Service: Orthopedics;  Laterality: Right;   HARDWARE REMOVAL Right 01/22/2021   Procedure: HARDWARE REMOVAL;  Surgeon: Myrene Galas, MD;  Location: [facility] OR;  Service: Orthopedics;  Laterality: Right;   JOINT REPLACEMENT  1998   left hip replacement and bilateral elbow replacements  about 15 yrs ago   ORIF ANKLE FRACTURE Bilateral 07/27/2019  Procedure: OPEN REDUCTION INTERNAL FIXATION (ORIF) ANKLE FRACTURE;  Surgeon: Myrene Galas, MD;  Location: [facility] OR;  Service: Orthopedics;  Laterality: Bilateral;   thyroidectomy     TOTAL ELBOW ARTHROPLASTY Right 07/13/2015   Procedure: RIGHT ELBOW REVISION TOTAL ELBOW ARTHROPLASTY ;  Surgeon: Dominica Severin, MD;  Location: [facility] OR;  Service: Orthopedics;  Laterality: Right;   TOTAL HIP ARTHROPLASTY     TOTAL HIP REVISION  03/11/2011   Procedure: TOTAL HIP REVISION;  Surgeon: [name], MD;  Location: [facility] ORS;  Service: Orthopedics;  Laterality: Left;   TRICEPS TENDON REPAIR Right 07/13/2015   Procedure:  TRICEP RECONSTRUCTION REPAIR WITH ALLOGRAFT, ULNAR  NERVE NEUROLYSIS;  Surgeon: Dominica Severin, MD;  Location: [facility] OR;  Service: Orthopedics;  Laterality: Right;   Social History:  reports that she has quit smoking. Her smoking use included cigarettes. She has never used smokeless tobacco. She reports that she does not drink alcohol and does not use drugs.  Allergies  Allergen Reactions   Contrast Media [Iodinated Contrast Media] Other (See Comments)    Itching.( 13 hr pre medication regimen worked fine. )   Tdap [Tetanus-Diphth-Acell Pertussis] Hives   Cholestyramine     Other reaction(s): stomach felt too full.   Duloxetine Hcl     Other reaction(s): didn't sleep  but just took one pill   Etidronate     Other reaction(s): diarrhea   Ezetimibe-Simvastatin     Other reaction(s): Unknown   Hydromorphone     Other reaction(s): Unknown   Pravastatin     Other reaction(s): with daily dose just hurt more.   Streptococcus (Diplococcus) Pneumoniae [Streptococci] Other (See Comments)    Shortness of breath    Family History  Problem Relation Age of Onset   Heart attack Mother    Prostate cancer Father    Lung disease Brother    Heart attack Sister    COPD Sister    Cancer Sister        She does not know which type of cancer    Prior to Admission medications   Medication Sig Start Date End Date Taking? Authorizing Provider  acetaminophen (TYLENOL) 325 MG tablet Take 2 tablets (650 mg total) by mouth 3 (three) times daily. 07/29/19   Drema Dallas, MD  albuterol (PROVENTIL) (2.5 MG/3ML) 0.083% nebulizer solution Take 3 mLs (2.5 mg total) by nebulization every 2 (two) hours as needed for wheezing. 03/21/21   Ghimire, Werner Lean, MD  amLODipine (NORVASC) 5 MG tablet Take 5 mg by mouth daily.    [provider]  aspirin EC 81 MG tablet Take 1 tablet (81 mg total) by mouth daily. Swallow whole. 03/21/21   Ghimire, Werner Lean, MD  benazepril (LOTENSIN) 20 MG tablet Take 20 mg by mouth every morning.    [provider]   bisacodyl (DULCOLAX) 5 MG EC tablet Take 1 tablet (5 mg total) by mouth daily as needed for moderate constipation. 07/29/19   Drema Dallas, MD  cholecalciferol (VITAMIN D3) 25 MCG (1000 UNIT) tablet Take 2,000 Units by mouth daily.    [provider]  denosumab (PROLIA) 60 MG/ML SOSY injection Inject 60 mg into the skin every 6 (six) months.    [provider]  fentaNYL (DURAGESIC) 50 MCG/HR Place onto the skin every 3 (three) days.    [provider]  guaiFENesin (MUCINEX) 600 MG 12 hr tablet Take 1 tablet (600 mg total) by mouth 2 (two) times daily as needed for cough.  03/21/21   Ghimire, Werner Lean, MD  levothyroxine (SYNTHROID) 125 MCG tablet Take 125 mcg by mouth daily before breakfast.    [provider]  LORazepam (ATIVAN) 0.5 MG tablet Take 0.5 mg by mouth 2 (two) times daily.    [provider]  methocarbamol (ROBAXIN) 500 MG tablet Take 500 mg by mouth 2 (two) times daily as needed for muscle spasms. 10/31/21   [provider]  methotrexate (RHEUMATREX) 2.5 MG tablet Take 15 mg by mouth every Sunday. 06/06/21   [provider]  omeprazole (PRILOSEC) 20 MG capsule Take 20 mg by mouth daily. 04/14/21   [provider]  oxyCODONE-acetaminophen (PERCOCET) 10-325 MG tablet Take 1 tablet by mouth every 6 (six) hours as needed for pain. 07/04/21   Dorcas Carrow, MD  polyethylene glycol (MIRALAX / GLYCOLAX) 17 g packet Take 17 g by mouth daily as needed for mild constipation. 03/21/21   Ghimire, Werner Lean, MD  potassium chloride (KLOR-CON) 10 MEQ tablet Take 20 mEq by mouth daily. 10/17/21   [provider]  predniSONE (DELTASONE) 5 MG tablet Take 1 tablet (5 mg total) by mouth daily. 07/05/21   Dorcas Carrow, MD  sertraline (ZOLOFT) 50 MG tablet Take 50 mg by mouth daily. 06/21/21   [provider]  vitamin B-12 (CYANOCOBALAMIN) 1000 MCG tablet Take 1,000 mcg by mouth daily.    [provider]     Physical Exam: Vitals:   09/03/22 1500 09/03/22 1600 09/03/22 1802 09/03/22 1900  BP: 134/64 121/81  (!) 139/94  Pulse: 84 94  83  Resp: 16 19  19   Temp:   98.4 F (36.9 C)   TempSrc:   Oral   SpO2: 95% 91%  93%  Weight:      Height:       General: Patient is awake, even alert, however seems to defer talking to the daughters and not interested much in talking.  Is able to tell me where she grew up her name and date of birth.  Not able to tell me location or time of the day or day of the week.  Does not appear to be distressed.  On 2 L/min of supplementary oxygen.  Seems to have chronic left eye droop. Respiratory exam: Bilateral coarse breath sounds Cardiovascular exam S1-S2 normal Abdomen all quadrants soft nontender Extremities warm without edema.  Patient did not follow directions with lower extremity, this did not surprise the daughter at the bedside.  However to noxious stimulation of the soles mildly, patient does withdraw slightly both sides. Non toxic looking. Data Reviewed:  Labs on Admission:  Results for orders placed or performed during the hospital encounter of 09/03/22 (from the past 24 hour(s))  CBC     Status: Abnormal   Collection Time: 09/03/22  2:52 PM  Result Value Ref Range   WBC 8.3 4.0 - 10.5 K/uL   RBC 2.76 (L) 3.87 - 5.11 MIL/uL   Hemoglobin 7.5 (L) 12.0 - 15.0 g/dL   HCT 60.4 (L) 54.0 - 98.1 %   MCV 91.3 80.0 - 100.0 fL   MCH 27.2 26.0 - 34.0 pg   MCHC 29.8 (L) 30.0 - 36.0 g/dL   RDW 19.1 (H) 47.8 - 29.5 %   Platelets 600 (H) 150 - 400 K/uL   nRBC 0.0 0.0 - 0.2 %  Basic metabolic panel     Status: Abnormal   Collection Time: 09/03/22  2:52 PM  Result Value Ref Range   Sodium 133 (L)  135 - 145 mmol/L   Potassium 4.3 3.5 - 5.1 mmol/L   Chloride 100 98 - 111 mmol/L   CO2 23 22 - 32 mmol/L   Glucose, Bld 95 70 - 99 mg/dL   BUN 14 8 - 23 mg/dL   Creatinine, Ser 6.06 0.44 - 1.00 mg/dL   Calcium 8.3 (L) 8.9 - 10.3 mg/dL   GFR, Estimated >30 >16  mL/min   Anion gap 10 5 - 15  Hepatic function panel     Status: Abnormal   Collection Time: 09/03/22  2:52 PM  Result Value Ref Range   Total Protein 6.5 6.5 - 8.1 g/dL   Albumin 2.8 (L) 3.5 - 5.0 g/dL   AST 38 15 - 41 U/L   ALT 16 0 - 44 U/L   Alkaline Phosphatase 65 38 - 126 U/L   Total Bilirubin 0.6 0.3 - 1.2 mg/dL   Bilirubin, Direct 0.3 (H) 0.0 - 0.2 mg/dL   Indirect Bilirubin 0.3 0.3 - 0.9 mg/dL  Urinalysis, Routine w reflex microscopic -Urine, Clean Catch     Status: Abnormal   Collection Time: 09/03/22  4:09 PM  Result Value Ref Range   Color, Urine YELLOW YELLOW   APPearance CLEAR CLEAR   Specific Gravity, Urine 1.015 1.005 - 1.030   pH 5.0 5.0 - 8.0   Glucose, UA NEGATIVE NEGATIVE mg/dL   Hgb urine dipstick NEGATIVE NEGATIVE   Bilirubin Urine NEGATIVE NEGATIVE   Ketones, ur 5 (A) NEGATIVE mg/dL   Protein, ur NEGATIVE NEGATIVE mg/dL   Nitrite NEGATIVE NEGATIVE   Leukocytes,Ua NEGATIVE NEGATIVE  POC occult blood, ED     Status: Abnormal   Collection Time: 09/03/22  4:38 PM  Result Value Ref Range   Fecal Occult Bld POSITIVE (A) NEGATIVE   Basic Metabolic Panel: Recent Labs  Lab 09/03/22 1452  NA 133*  K 4.3  CL 100  CO2 23  GLUCOSE 95  BUN 14  CREATININE 0.82  CALCIUM 8.3*   Liver Function Tests: Recent Labs  Lab 09/03/22 1452  AST 38  ALT 16  ALKPHOS 65  BILITOT 0.6  PROT 6.5  ALBUMIN 2.8*   No results for input(s): "LIPASE", "AMYLASE" in the last 168 hours. No results for input(s): "AMMONIA" in the last 168 hours. CBC: Recent Labs  Lab 09/03/22 1452  WBC 8.3  HGB 7.5*  HCT 25.2*  MCV 91.3  PLT 600*   Cardiac Enzymes: No results for input(s): "CKTOTAL", "CKMB", "CKMBINDEX", "TROPONINIHS" in the last 168 hours.  BNP (last 3 results) No results for input(s): "PROBNP" in the last 8760 hours. CBG: No results for input(s): "GLUCAP" in the last 168 hours.  Radiological Exams on Admission:  DG Chest 2 View  Result Date:  09/03/2022 CLINICAL DATA:  Cough, frequent urination, confusion EXAM: CHEST - 2 VIEW COMPARISON:  05/26/2022 FINDINGS: Frontal and lateral views of the chest demonstrate an unremarkable cardiac silhouette. There is bilateral airspace disease most pronounced within the right upper lobe, which may reflect asymmetric edema or multifocal bronchopneumonia. No effusion or pneumothorax. No acute bony abnormalities. IMPRESSION: 1. Bilateral airspace disease greatest in the right upper lobe, which could reflect multifocal bronchopneumonia or asymmetric edema. Electronically Signed   By: Sharlet Salina M.D.   On: 09/03/2022 18:14   CT ABDOMEN PELVIS WO CONTRAST  Result Date: 09/03/2022 CLINICAL DATA:  Increased urinary frequency and altered mental status EXAM: CT ABDOMEN AND PELVIS WITHOUT CONTRAST TECHNIQUE: Multidetector CT imaging of the abdomen and pelvis was performed  following the standard protocol without IV contrast. RADIATION DOSE REDUCTION: This exam was performed according to the departmental dose-optimization program which includes automated exposure control, adjustment of the mA and/or kV according to patient size and/or use of iterative reconstruction technique. COMPARISON:  CT abdomen and pelvis dated 05/26/2022 and multiple priors FINDINGS: Lower chest: Left basilar nodule measured 8 x 6 mm (7:22), unchanged from 11/02/2021 but slightly enlarged in size from 5 x 4 mm on 03/31/2014. Partially imaged bibasilar scarring and traction bronchiectasis. No pleural effusion or pneumothorax demonstrated. Partially imaged heart size is normal. Coronary artery calcifications. Hepatobiliary: No focal hepatic lesions. No intra or extrahepatic biliary ductal dilation. Normal gallbladder. Pancreas: Diffuse parenchymal atrophy.  No main ductal dilation. Spleen: Normal in size without focal abnormality. Adrenals/Urinary Tract: No adrenal nodules. No suspicious renal mass by noncontrast technique or hydronephrosis. Punctate  nonobstructing right interpolar stone. No focal bladder wall thickening. Stomach/Bowel: Moderate hiatal hernia. Normal appearance of the stomach. No evidence of bowel wall thickening, distention, or inflammatory changes. Appendix is not discretely seen. Vascular/Lymphatic: Aortic atherosclerosis. No enlarged abdominal or pelvic lymph nodes. Reproductive: No adnexal masses. Other: No free fluid, fluid collection, or free air. Musculoskeletal: No acute or abnormal lytic or blastic osseous lesions. Multilevel degenerative changes of the partially imaged thoracic and lumbar spine. Unchanged inferior endplate compression deformity of L4. Partially imaged left hip arthroplasty. Old fracture of left inferior pubic ramus. IMPRESSION: 1. No acute abnormality in the abdomen or pelvis. 2. Punctate nonobstructing right interpolar renal stone. 3. Left basilar lung nodule measures 8 x 6 mm, unchanged from 11/02/2021 but slightly enlarged in size from 5 x 4 mm on 03/31/2014. Non-contrast chest CT at 6-12 months is recommended. If the nodule is stable at time of repeat CT, then future CT at 18-24 months (from today's scan) is considered optional for low-risk patients, but is recommended for high-risk patients. This recommendation follows the consensus statement: Guidelines for Management of Incidental Pulmonary Nodules Detected on CT Images: From the Fleischner Society 2017; Radiology 2017; 284:228-243. 4. Moderate hiatal hernia. 5. Aortic Atherosclerosis (ICD10-I70.0). Coronary artery calcifications. Assessment for potential risk factor modification, dietary therapy or pharmacologic therapy may be warranted, if clinically indicated. Electronically Signed   By: Agustin Cree M.D.   On: 09/03/2022 17:59   CT Head Wo Contrast  Result Date: 09/03/2022 CLINICAL DATA:  Provided history: Altered mental status, nontraumatic. Frequent urination. Confusion. EXAM: CT HEAD WITHOUT CONTRAST TECHNIQUE: Contiguous axial images were obtained from  the base of the skull through the vertex without intravenous contrast. RADIATION DOSE REDUCTION: This exam was performed according to the departmental dose-optimization program which includes automated exposure control, adjustment of the mA and/or kV according to patient size and/or use of iterative reconstruction technique. COMPARISON:  Head CT 07/26/2019. FINDINGS: Brain: Generalized parenchymal atrophy. Patchy and ill-defined hypoattenuation within the cerebral white matter, nonspecific but compatible with moderate chronic small vessel ischemic disease. There is no acute intracranial hemorrhage. No demarcated cortical infarct. No extra-axial fluid collection. No evidence of an intracranial mass. No midline shift. Vascular: No hyperdense vessel.  Atherosclerotic calcifications. Skull: No calvarial fracture or aggressive osseous lesion. Sinuses/Orbits: No mass or acute finding within the imaged orbits. Minimal mucosal thickening within the left maxillary sinus at the imaged levels. Other: Small-Volume fluid within the bilateral mastoid air cells. IMPRESSION: 1. No evidence of an acute intracranial abnormality. 2. Moderate chronic small vessel ischemic changes within the cerebral white matter. 3. Generalized parenchymal atrophy. 4. Small-volume fluid within the bilateral  mastoid air cells. Electronically Signed   By: Jackey Loge D.O.   On: 09/03/2022 17:46    EKG: Independently reviewed. NSR   Assessment and Plan: * Aspiration pneumonitis (HCC) Patient's respiratory complaints seem to have started closely after or in conjunction with the episodes of vomiting 3 days ago.  I will check COVID-19 as well as respiratory viral panel.  Blood cultures.  Patient is s/p ceftriaxone and azithromycin in the ER.  Continue treat with with Unasyn and azithromycin.  Patient is borderline hypoxic, continue with supplementary oxygen continuous pulse oximetry as needed.  Note methotrexate before below.  Patient is  immunocompromised as well.  Lung nodule Increased size. Will need outpatient work up in this regard.  Fecal occult blood test positive At this time we will have to defer further workup in this regard.  I think a colonoscopy discussion could potentially be had after the patient is discharged from the hospital.  Delirium Reports chronic episodes of patient having confusion with various infections.  However currently decompensated.  I believe this is likely due to hypoxemia as well as metabolic condition.  However obviously CT head is negative, will also check B12 and TSH.  This is subacute going on for at least 3 days if not 7 days.  Nonfocal exam thus far  I will taper ativan to 0.25 mg bid. I would favor ceasing this medicaiton over the long term.  Anemia Felt to be asymptomatic, worse than baseline.  Anemia workup ordered including haptoglobin and serum electrophoresis.  Trend no report of hematuria or bleeding per rectum.  RA (rheumatoid arthritis) (HCC) Hold methotrexate, continue with prednisone 5 mg daily.  Patient should get stress dose steroids in case of any vital instability.  Chronic neck and back pain Continue with home dose of Percocet.  I have reduced patient's fentanyl patch from 50 mcg an hour to 25 mcg an hour.  Due to delirium.  Obviously if this causes uncontrolled pain we will escalate the dose back up.  GI prophylaxis ordered  Rheumatoid arthritis is Not in flare.    Advance Care Planning:   Code Status: DNR discussed with daughter/HCP  Consults: none at this time.  Family Communication: Family at bedside all questions answered  Severity of Illness: The appropriate patient status for this patient is INPATIENT. Inpatient status is judged to be reasonable and necessary in order to provide the required intensity of service to ensure the patient's safety. The patient's presenting symptoms, physical exam findings, and initial radiographic and laboratory data in the  context of their chronic comorbidities is felt to place them at high risk for further clinical deterioration. Furthermore, it is not anticipated that the patient will be medically stable for discharge from the hospital within 2 midnights of admission.   * I certify that at the point of admission it is my clinical judgment that the patient will require inpatient hospital care spanning beyond 2 midnights from the point of admission due to high intensity of service, high risk for further deterioration and high frequency of surveillance required.*  Author: Nolberto Hanlon, MD 09/03/2022 7:53 PM  For on call review [email or web address].

## 2022-09-03 NOTE — ED Provider Notes (Signed)
Jocelyn Bauer   CSN: [id number] Arrival date & time: 09/03/22  1326     History  Chief Complaint  Patient presents with   Altered Mental Status    Jocelyn Bauer is a 89 y.o. female.  The history is provided by a relative.  Altered Mental Status Associated symptoms: no headaches, no light-headedness and no seizures    Jocelyn Bauer is  89 year old hx of cellulitis and UTI on antibiotics ( Macrobid), day 6/7 presented to the ED due to concerns for altered mental status.Pt has her daughters at bedside  who reports patient waxes and wanes in her mentation for the past few days. Does report that over the past 2 days, patient has been staring inappropriately ( denies hx of stroke ) and also saying things  does not make sense.  Says the symptoms began around the same time she was diagnosed with a UTI a week ago . Denies any recent falls, any recent change in medications, or use of any illicit drugs including alcohol and nicotine.  Does endorse ongoing burning with urination despite being on Macrobid for 6 days. Wondering if this is delirium induced by infection in the setting of her UTI vs pneumonia.  Patient also reports abdominal pain around at lower quadrants. Daughters al;so reports history of intermittent cough.  Patient is currently afebrile ,oriented to person, place,self  but not to time.  Home Medications Prior to Admission medications   Medication Sig Start Date End Date Taking? Authorizing Provider  acetaminophen (TYLENOL) 325 MG tablet Take 2 tablets (650 mg total) by mouth 3 (three) times daily. 07/29/19   Drema Dallas, MD  albuterol (PROVENTIL) (2.5 MG/3ML) 0.083% nebulizer solution Take 3 mLs (2.5 mg total) by nebulization every 2 (two) hours as needed for wheezing. 03/21/21   Ghimire, Werner Lean, MD  amLODipine (NORVASC) 5 MG tablet Take 5 mg by mouth daily.    [provider]  aspirin EC 81 MG tablet Take 1  tablet (81 mg total) by mouth daily. Swallow whole. 03/21/21   Ghimire, Werner Lean, MD  benazepril (LOTENSIN) 20 MG tablet Take 20 mg by mouth every morning.    [provider]  bisacodyl (DULCOLAX) 5 MG EC tablet Take 1 tablet (5 mg total) by mouth daily as needed for moderate constipation. 07/29/19   Drema Dallas, MD  cholecalciferol (VITAMIN D3) 25 MCG (1000 UNIT) tablet Take 2,000 Units by mouth daily.    [provider]  denosumab (PROLIA) 60 MG/ML SOSY injection Inject 60 mg into the skin every 6 (six) months.    [provider]  fentaNYL (DURAGESIC) 50 MCG/HR Place onto the skin every 3 (three) days.    [provider]  guaiFENesin (MUCINEX) 600 MG 12 hr tablet Take 1 tablet (600 mg total) by mouth 2 (two) times daily as needed for cough. 03/21/21   Ghimire, Werner Lean, MD  levothyroxine (SYNTHROID) 125 MCG tablet Take 125 mcg by mouth daily before breakfast.    [provider]  LORazepam (ATIVAN) 0.5 MG tablet Take 0.5 mg by mouth 2 (two) times daily.    [provider]  methocarbamol (ROBAXIN) 500 MG tablet Take 500 mg by mouth 2 (two) times daily as needed for muscle spasms. 10/31/21   [provider]  methotrexate (RHEUMATREX) 2.5 MG tablet Take 15 mg by mouth every Sunday. 06/06/21   [provider]  omeprazole (PRILOSEC) 20 MG capsule Take  20 mg by mouth daily. 04/14/21   [provider]  oxyCODONE-acetaminophen (PERCOCET) 10-325 MG tablet Take 1 tablet by mouth every 6 (six) hours as needed for pain. 07/04/21   Dorcas Carrow, MD  polyethylene glycol (MIRALAX / GLYCOLAX) 17 g packet Take 17 g by mouth daily as needed for mild constipation. 03/21/21   Ghimire, Werner Lean, MD  potassium chloride (KLOR-CON) 10 MEQ tablet Take 20 mEq by mouth daily. 10/17/21   [provider]  predniSONE (DELTASONE) 5 MG tablet Take 1 tablet (5 mg total) by mouth daily. 07/05/21   Dorcas Carrow, MD  sertraline (ZOLOFT) 50 MG  tablet Take 50 mg by mouth daily. 06/21/21   [provider]  vitamin B-12 (CYANOCOBALAMIN) 1000 MCG tablet Take 1,000 mcg by mouth daily.    [provider]      Allergies    Contrast media [iodinated contrast media], Tdap [tetanus-diphth-acell pertussis], Cholestyramine, Duloxetine hcl, Etidronate, Ezetimibe-simvastatin, Hydromorphone, Pravastatin, and Streptococcus (diplococcus) pneumoniae [streptococci]    Review of Systems   Review of Systems  Constitutional:  Positive for appetite change.  HENT:  Negative for congestion.   Genitourinary:  Positive for dysuria and urgency.  Neurological:  Negative for dizziness, seizures, syncope, speech difficulty, light-headedness and headaches.    Physical Exam Updated Vital Signs BP 121/81   Pulse 94   Temp 98.4 F (36.9 C) (Oral)   Resp 19   Ht 5\' 2"  (1.575 m)   Wt 60.8 kg   SpO2 91%   BMI 24.52 kg/m   Physical Exam Constitutional:      General: She is not in acute distress.    Appearance: She is not ill-appearing.  HENT:     Head: Normocephalic.     Comments: No signs of head trauma or active bleeding to the head Cardiovascular:     Rate and Rhythm: Normal rate and regular rhythm.  Pulmonary:     Effort: Pulmonary effort is normal.     Breath sounds: Normal breath sounds.  Abdominal:     Comments: No tender, no signs of guarding   Skin:    General: Skin is warm.  Neurological:     Mental Status: She is alert.     ED Results / Procedures / Treatments   Labs (all labs ordered are listed, but only abnormal results are displayed) Labs Reviewed  CBC - Abnormal; Notable for the following components:      Result Value   RBC 2.76 (*)    Hemoglobin 7.5 (*)    HCT 25.2 (*)    MCHC 29.8 (*)    RDW 19.4 (*)    Platelets 600 (*)    All other components within normal limits  BASIC METABOLIC PANEL - Abnormal; Notable for the following components:   Sodium 133 (*)    Calcium 8.3 (*)    All other components  within normal limits  URINALYSIS, ROUTINE W REFLEX MICROSCOPIC - Abnormal; Notable for the following components:   Ketones, ur 5 (*)    All other components within normal limits  HEPATIC FUNCTION PANEL - Abnormal; Notable for the following components:   Albumin 2.8 (*)    Bilirubin, Direct 0.3 (*)    All other components within normal limits  POC OCCULT BLOOD, ED - Abnormal; Notable for the following components:   Fecal Occult Bld POSITIVE (*)    All other components within normal limits    EKG EKG Interpretation Date/Time:  Tuesday September 03 2022 13:59:39 EDT Ventricular  Rate:  85 PR Interval:  177 QRS Duration:  91 QT Interval:  415 QTC Calculation: 494 R Axis:   -23  Text Interpretation: Sinus rhythm Borderline left axis deviation Abnormal R-wave progression, early transition Borderline T wave abnormalities Borderline prolonged QT interval Confirmed by Anders Simmonds [phone]) on 09/03/2022 2:02:04 PM  Radiology DG Chest 2 View  Result Date: 09/03/2022 CLINICAL DATA:  Cough, frequent urination, confusion EXAM: CHEST - 2 VIEW COMPARISON:  05/26/2022 FINDINGS: Frontal and lateral views of the chest demonstrate an unremarkable cardiac silhouette. There is bilateral airspace disease most pronounced within the right upper lobe, which may reflect asymmetric edema or multifocal bronchopneumonia. No effusion or pneumothorax. No acute bony abnormalities. IMPRESSION: 1. Bilateral airspace disease greatest in the right upper lobe, which could reflect multifocal bronchopneumonia or asymmetric edema. Electronically Signed   By: Sharlet Salina M.D.   On: 09/03/2022 18:14   CT ABDOMEN PELVIS WO CONTRAST  Result Date: 09/03/2022 CLINICAL DATA:  Increased urinary frequency and altered mental status EXAM: CT ABDOMEN AND PELVIS WITHOUT CONTRAST TECHNIQUE: Multidetector CT imaging of the abdomen and pelvis was performed following the standard protocol without IV contrast. RADIATION DOSE REDUCTION: This  exam was performed according to the departmental dose-optimization program which includes automated exposure control, adjustment of the mA and/or kV according to patient size and/or use of iterative reconstruction technique. COMPARISON:  CT abdomen and pelvis dated 05/26/2022 and multiple priors FINDINGS: Lower chest: Left basilar nodule measured 8 x 6 mm (7:22), unchanged from 11/02/2021 but slightly enlarged in size from 5 x 4 mm on 03/31/2014. Partially imaged bibasilar scarring and traction bronchiectasis. No pleural effusion or pneumothorax demonstrated. Partially imaged heart size is normal. Coronary artery calcifications. Hepatobiliary: No focal hepatic lesions. No intra or extrahepatic biliary ductal dilation. Normal gallbladder. Pancreas: Diffuse parenchymal atrophy.  No main ductal dilation. Spleen: Normal in size without focal abnormality. Adrenals/Urinary Tract: No adrenal nodules. No suspicious renal mass by noncontrast technique or hydronephrosis. Punctate nonobstructing right interpolar stone. No focal bladder wall thickening. Stomach/Bowel: Moderate hiatal hernia. Normal appearance of the stomach. No evidence of bowel wall thickening, distention, or inflammatory changes. Appendix is not discretely seen. Vascular/Lymphatic: Aortic atherosclerosis. No enlarged abdominal or pelvic lymph nodes. Reproductive: No adnexal masses. Other: No free fluid, fluid collection, or free air. Musculoskeletal: No acute or abnormal lytic or blastic osseous lesions. Multilevel degenerative changes of the partially imaged thoracic and lumbar spine. Unchanged inferior endplate compression deformity of L4. Partially imaged left hip arthroplasty. Old fracture of left inferior pubic ramus. IMPRESSION: 1. No acute abnormality in the abdomen or pelvis. 2. Punctate nonobstructing right interpolar renal stone. 3. Left basilar lung nodule measures 8 x 6 mm, unchanged from 11/02/2021 but slightly enlarged in size from 5 x 4 mm on  03/31/2014. Non-contrast chest CT at 6-12 months is recommended. If the nodule is stable at time of repeat CT, then future CT at 18-24 months (from today's scan) is considered optional for low-risk patients, but is recommended for high-risk patients. This recommendation follows the consensus statement: Guidelines for Management of Incidental Pulmonary Nodules Detected on CT Images: From the Fleischner Society 2017; Radiology 2017; 284:228-243. 4. Moderate hiatal hernia. 5. Aortic Atherosclerosis (ICD10-I70.0). Coronary artery calcifications. Assessment for potential risk factor modification, dietary therapy or pharmacologic therapy may be warranted, if clinically indicated. Electronically Signed   By: Agustin Cree M.D.   On: 09/03/2022 17:59   CT Head Wo Contrast  Result Date: 09/03/2022 CLINICAL DATA:  Provided history: Altered  mental status, nontraumatic. Frequent urination. Confusion. EXAM: CT HEAD WITHOUT CONTRAST TECHNIQUE: Contiguous axial images were obtained from the base of the skull through the vertex without intravenous contrast. RADIATION DOSE REDUCTION: This exam was performed according to the departmental dose-optimization program which includes automated exposure control, adjustment of the mA and/or kV according to patient size and/or use of iterative reconstruction technique. COMPARISON:  Head CT 07/26/2019. FINDINGS: Brain: Generalized parenchymal atrophy. Patchy and ill-defined hypoattenuation within the cerebral white matter, nonspecific but compatible with moderate chronic small vessel ischemic disease. There is no acute intracranial hemorrhage. No demarcated cortical infarct. No extra-axial fluid collection. No evidence of an intracranial mass. No midline shift. Vascular: No hyperdense vessel.  Atherosclerotic calcifications. Skull: No calvarial fracture or aggressive osseous lesion. Sinuses/Orbits: No mass or acute finding within the imaged orbits. Minimal mucosal thickening within the left  maxillary sinus at the imaged levels. Other: Small-Volume fluid within the bilateral mastoid air cells. IMPRESSION: 1. No evidence of an acute intracranial abnormality. 2. Moderate chronic small vessel ischemic changes within the cerebral white matter. 3. Generalized parenchymal atrophy. 4. Small-volume fluid within the bilateral mastoid air cells. Electronically Signed   By: Jackey Loge D.O.   On: 09/03/2022 17:46    Procedures Procedures    Medications Ordered in ED Medications  cefTRIAXone (ROCEPHIN) 1 g in sodium chloride 0.9 % 100 mL IVPB (has no administration in time range)  azithromycin (ZITHROMAX) 500 mg in sodium chloride 0.9 % 250 mL IVPB (has no administration in time range)    ED Course/ Medical Decision Making/ A&P                                 Medical Decision Making  # Altered mental status History of cellulitis and recent UTI on Macrobid day 6/7, presented to the ED due to concerns of recent changes in mentation.  Daughter at bedside reported that pt has been acting differently, and not answering questions appropriately.  Patient denies any recent falls, recent changes in medications.  She reports that she has not been eating and drinking well for the past couple of days.  Wondering if this altered mental status has an infectious etiology in the setting of a UT vs metabolic derangement due to recent poor p.o. intake versus trauma which is less likely at this time. Reports allergic to contrast. CT head showed no evidence of an acute intracranial abnormality at this time. Chest imaging did show evidence of Pneumonia.  - Will start Ceftriaxone and Azithromycin and consult hospitalist for admission     #Anemia  CBC came back remarkable for Hgb of 7.5 from 9.6 in May 2024.Fecal occult blood came back positive concerning GI bleed - Refer for an outpatient colonoscopy   Amount and/or Complexity of Data Reviewed Independent Historian:     Details: Daughters at bedside   External Data Reviewed: labs. Labs: ordered.    Details: CBC, BMP,Hepatic function panel  Radiology: ordered.    Details: CT HEAD W/O CONTRAST  ECG/medicine tests: ordered.         Final Clinical Impression(s) / ED Diagnoses Final diagnoses:  None    Rx / DC Orders ED Discharge Orders     None         Kathleen Lime, MD 09/03/22 1923    Tegeler, Canary Brim, MD 09/04/22 [phone]

## 2022-09-03 NOTE — Assessment & Plan Note (Signed)
Resume home dose of Fentanyl patch (this was decreased on admission but is a long-acting medication and so this change was unlikely to affect significant change) Separate oxy IR dosing from Tylenol - continue both medications but give Oxy only prn if Tylenol and fentanyl aren't effective I have reviewed this patient in the [place] Controlled Substances Reporting System.  She is receiving medications from multiple providers and IS at particularly high risk of opioid misuse, diversion, or overdose.

## 2022-09-04 ENCOUNTER — Inpatient Hospital Stay (HOSPITAL_COMMUNITY): Payer: Medicare Other

## 2022-09-04 DIAGNOSIS — D649 Anemia, unspecified: Secondary | ICD-10-CM

## 2022-09-04 DIAGNOSIS — M7989 Other specified soft tissue disorders: Secondary | ICD-10-CM | POA: Diagnosis not present

## 2022-09-04 DIAGNOSIS — R262 Difficulty in walking, not elsewhere classified: Secondary | ICD-10-CM | POA: Diagnosis present

## 2022-09-04 LAB — RESPIRATORY PANEL BY PCR

## 2022-09-04 LAB — PROTIME-INR
INR: 1.2 (ref 0.8–1.2)
Prothrombin Time: 15.3 s — ABNORMAL HIGH (ref 11.4–15.2)

## 2022-09-04 LAB — CBC
HCT: 24.2 % — ABNORMAL LOW (ref 36.0–46.0)
Hemoglobin: 7.1 g/dL — ABNORMAL LOW (ref 12.0–15.0)
MCH: 27.3 pg (ref 26.0–34.0)
MCHC: 29.3 g/dL — ABNORMAL LOW (ref 30.0–36.0)
MCV: 93.1 fL (ref 80.0–100.0)
Platelets: 522 10*3/uL — ABNORMAL HIGH (ref 150–400)
RBC: 2.6 MIL/uL — ABNORMAL LOW (ref 3.87–5.11)
RDW: 19.1 % — ABNORMAL HIGH (ref 11.5–15.5)
WBC: 7.2 10*3/uL (ref 4.0–10.5)
nRBC: 0 % (ref 0.0–0.2)

## 2022-09-04 LAB — IRON AND TIBC
Iron: 17 ug/dL — ABNORMAL LOW (ref 28–170)
Saturation Ratios: 6 % — ABNORMAL LOW (ref 10.4–31.8)
TIBC: 309 ug/dL (ref 250–450)
UIBC: 292 ug/dL

## 2022-09-04 LAB — BASIC METABOLIC PANEL
Anion gap: 11 (ref 5–15)
BUN: 13 mg/dL (ref 8–23)
CO2: 22 mmol/L (ref 22–32)
Calcium: 8.1 mg/dL — ABNORMAL LOW (ref 8.9–10.3)
Chloride: 99 mmol/L (ref 98–111)
Creatinine, Ser: 0.83 mg/dL (ref 0.44–1.00)
GFR, Estimated: >60 mL/min (ref >60)
Glucose, Bld: 91 mg/dL (ref 70–99)
Potassium: 3.1 mmol/L — ABNORMAL LOW (ref 3.5–5.1)
Sodium: 132 mmol/L — ABNORMAL LOW (ref 135–145)

## 2022-09-04 LAB — ABO/RH: ABO/RH(D): A POS

## 2022-09-04 LAB — PREPARE RBC (CROSSMATCH)

## 2022-09-04 LAB — GLUCOSE, CAPILLARY: Glucose-Capillary: 117 mg/dL — ABNORMAL HIGH (ref 70–99)

## 2022-09-04 LAB — FOLATE: Folate: 13.5 ng/mL (ref >5.9)

## 2022-09-04 LAB — FERRITIN: Ferritin: 36 ng/mL (ref 11–307)

## 2022-09-04 LAB — VITAMIN B12: Vitamin B-12: 2154 pg/mL — ABNORMAL HIGH (ref 180–914)

## 2022-09-04 LAB — SARS CORONAVIRUS 2 BY RT PCR: SARS Coronavirus 2 by RT PCR: NEGATIVE

## 2022-09-04 LAB — APTT: aPTT: 35 s (ref 24–36)

## 2022-09-04 MED ORDER — GUAIFENESIN ER 600 MG PO TB12
600.0000 mg | ORAL_TABLET | Freq: Two times a day (BID) | ORAL | Status: DC | PRN
  Administered 2022-09-07 – 2022-09-08 (×2): 600 mg via ORAL
  Filled 2022-09-04 (×2): qty 1

## 2022-09-04 MED ORDER — PANTOPRAZOLE SODIUM 40 MG IV SOLR: 40.0000 mg | INTRAVENOUS | Status: DC

## 2022-09-04 MED ORDER — SODIUM CHLORIDE 0.9% IV SOLUTION: Freq: Once | INTRAVENOUS | Status: AC

## 2022-09-04 MED ORDER — POTASSIUM CHLORIDE 20 MEQ PO PACK
40.0000 meq | PACK | Freq: Once | ORAL | Status: AC
  Administered 2022-09-04: 40 meq via ORAL
  Filled 2022-09-04: qty 2

## 2022-09-04 MED ORDER — PANTOPRAZOLE SODIUM 40 MG IV SOLR
40.0000 mg | INTRAVENOUS | Status: DC
  Administered 2022-09-05 – 2022-09-09 (×5): 40 mg via INTRAVENOUS
  Filled 2022-09-04 (×5): qty 10

## 2022-09-04 MED ORDER — FENTANYL 50 MCG/HR TD PT72: 1.0000 | MEDICATED_PATCH | TRANSDERMAL | Status: DC

## 2022-09-04 NOTE — Plan of Care (Signed)

## 2022-09-04 NOTE — Progress Notes (Signed)
Progress Note   Patient: Jocelyn Bauer ZOX:[id number] DOB: [birth year] DOA: 09/03/2022     1 DOS: the patient was seen and examined on 09/04/2022   Brief hospital course: 89yo with h/o chronic deconditioning/ambulatory dysfunction in wheelchair presenting with AMS.  She developed dysuria a week ago and was treated with Bactrim and developed confusion, hallucinations, and generalized weakness.  She was noted to be 85% on room air, now stable on 2L [place] O2.  She was diagnosed with aspiration PNA and started on Ceftriaxone -> Unasyn and Azithromycin.  Assessment and Plan: * Symptomatic anemia Patient with Hgb 9.6 in May, currently 7.5 -> 7.1 and heme positive In talking with her daughter, primary concerns were AMS, DOE, and fatigue - c/w symptomatic anemia Concern for GI bleed, but given her age and frailty, further evaluation may not be reasonable (see below) Family is amenable to transfusion Will transfuse 1 unit PRBC and recheck CBC in AM  Ambulatory dysfunction Wheelchair dependent at baseline but currently having more difficulty transferring PT/OT consults Appears likely to need SNF Family requests [facility]  Lung nodule Increased size. Will need outpatient work up in this regard.  Occult blood in stools Patient presenting with likely occult GI bleeding There is no report of frank bleeding or melena from patient or family Daughter is unsure who did her last colonoscopy and is also unsure if family would be willing to do another For now, will start Protonix IV daily and continue to follow without intervention  Delirium Reports chronic episodes of patient having confusion with various infections.   Currently decompensated, possibly from infection and/or symptomatic anemia CT head is negative Taper ativan to 0.25 mg bid. I would favor ceasing this medicaiton over the long term. Add delirium precautions  Multifocal pneumonia She is on 2L home O2 but has had worsening SOB for  several weeks that worsened after she vomited recently Negative COVID and RVP testing Some concern for aspiration, will request SLP evaluation Given ceftriaxone and azithromycin in the ER, changed to Unasyn and azithromycin.   She is on methotrexate and prednisone and so is immunocompromised as well.  RA (rheumatoid arthritis) (HCC) Hold methotrexate, continue with prednisone 5 mg daily.  Patient should get stress dose steroids in case of any vital instability.  Chronic neck and back pain Resume home dose of Fentanyl patch (this was decreased on admission but is a long-acting medication and so this change was unlikely to affect significant change) Separate oxy IR dosing from Tylenol - continue both medications but give Oxy only prn if Tylenol and fentanyl aren't effective I have reviewed this patient in the [place] Controlled Substances Reporting System.  She is receiving medications from multiple providers and IS at particularly high risk of opioid misuse, diversion, or overdose.     Consultants: Palliative care PT/OT SLP TOC team  Procedures: None  Antibiotics: Ceftriaxone 8/27 Unasyn 8/27- Azithromycin 8/27-31  30 Day Unplanned Readmission Risk Score    Flowsheet Row ED to Hosp-Admission (Current) from 09/03/2022 in [place] [facility]TH FLOOR [facility] C[facility] UROLOGY  30 Day Unplanned Readmission Risk Score (%) 27.09 Filed at 09/04/2022 0801       This score is the patient's risk of an unplanned readmission within 30 days of being discharged (0 -100%). The score is based on dignosis, age, lab data, medications, orders, and past utilization.   Low:  0-14.9   Medium: 15-21.9   High: 22-29.9   Extreme: 30 and above  Subjective: I spoke with her daughter.  She has been very confused at home, doesn't know what day it is for the last 2 weeks, worse particularly in the last 3 days.  She has hallucinations.  Her mind has always been clear.  They thought she had a UTI 7-10  days ago, given an antibiotic.  She has a h/o PNA and they thought she might have this prior to the possible UTI, was on 14 days of abx.  Her legs are very weak, difficulty standing.  She has been in a wheelchair for a year or more.  Last weekend, she seemed to be SOB periodically, on 2L home O2.  Saturday or Sunday, they tried to give medications and she spit it up, seemed to choke a bit.  Over a week ago, she had nausea and vomiting for 2 days.  She is not able to come home, will need therapy at [facility] at time of discharge.  They have not noticed a change in her stools.  No h/o GI bleeding.  They are ok with giving her blood, but they are not sure about anything further (GI consult, scope) and would like to have a family meeting.  Will go ahead and order palliative care consult for assistance.   Objective: Vitals:   09/04/22 1212 09/04/22 1238  BP:  (!) 107/47  Pulse:  80  Resp:  18  Temp: (!) 100.9 F (38.3 C) 99 F (37.2 C)  SpO2:  97%    Intake/Output Summary (Last 24 hours) at 09/04/2022 1517 Last data filed at 09/04/2022 1441 Gross per 24 hour  Intake 716.87 ml  Output --  Net 716.87 ml   Filed Weights   09/03/22 1357  Weight: 60.8 kg    Exam:  General:  Appears calm and comfortable and is in NAD, somnolent and somewhat disinterested Eyes:  EOMI, normal lids, iris ENT:  grossly normal hearing, lips & tongue, mmm Neck:  no LAD, masses or thyromegaly Cardiovascular:  RRR, no m/r/g. No LE edema.  Respiratory:   CTA bilaterally with no wheezes/rales/rhonchi.  Normal respiratory effort. Abdomen:  soft, NT, ND Skin:  no rash or induration seen on limited exam Musculoskeletal:  grossly normal tone BUE/BLE, good ROM, no bony abnormality Psychiatric:  somnolent mood and affect, speech sparse but appropriate Neurologic:  CN 2-12 grossly intact, moves all extremities in coordinated fashion  Data Reviewed: I have reviewed the patient's lab results since admission.  Pertinent  labs for today include:   Na++ 132 K+ 3.1 Iron 17, ferritin 36 WBC 7.2 Hgb 7.1, 7.5 on presentation, 9.6 on 5/24 INR 1.2 TSH 20.963 Heme positive Blood cultures pending RVP pending    Family Communication: None present; I spoke with her daughter by telephone   Disposition: Status is: Inpatient Remains inpatient appropriate because: ongoing evaluation/treatment  Planned Discharge Destination: Skilled nursing facility    Time spent: 50 minutes  Author: Jonah Blue, MD 09/04/2022 3:17 PM  For on call review [email or web address].

## 2022-09-04 NOTE — Assessment & Plan Note (Addendum)
Patient presenting with likely upper GI bleeding There was no report of frank bleeding or melena from patient or family on presentation but patient now acknowledges that her stools have been very black She is having additional tarry stools Continue Protonix IV daily If ongoing bleeding, consider risks/benefits of EGD - however at this time family would not consider this option given her extremely high risk of poor outcome so will hold on GI consult Needs avoidance of all NSAIDs

## 2022-09-04 NOTE — Evaluation (Signed)
Occupational Therapy Evaluation Patient Details Name: Jocelyn Bauer MRN: [id number] DOB: [birth year] Today's Date: 09/04/2022   History of Present Illness Pt is a 89 yr old female admitted to the hospital with altered mental status and weakness. PMH: panic attacks, RA, bilateral ankle fractures, chronic pain, HTN, OA, OP   Clinical Impression   Pt is currently presenting significantly below her baseline level of functioning for self-care management, given the below listed deficits (see OT problem list); per pt's daughter, pt was modified independent to independent with feeding, toileting & dressing at her baseline. At current, pt is total assist for all ADLs at bed level. During today's session, pt presented with significant lethargy. She would often keep her eyes closed, though she could open them for a few seconds once verbal and tactile cues were provided.  She could respond with simple 1-2 word responses, however could not engage in meaningful conversational exchange; pt's daughter indicated this is significantly different from pt's normal presentation and abilities. OT anticipates pt's overall functional abilities will improve significantly, pending improvement in her mental status and alertness. OT will follow pt for further services in the acute care setting to facilitate improved ADL performance, and to decrease the risk for further weakness with progressive functional decline. SNF rehab recommended at current.       If plan is discharge home, recommend the following: A lot of help with walking and/or transfers;A lot of help with bathing/dressing/bathroom;Assistance with feeding;Direct supervision/assist for financial management;Assistance with cooking/housework;Supervision due to cognitive status;Direct supervision/assist for medications management    Functional Status Assessment  Patient has had a recent decline in their functional status and demonstrates the ability to make significant  improvements in function in a reasonable and predictable amount of time.  Equipment Recommendations  Other (comment) (defer to next level of care)    Recommendations for Other Services       Precautions / Restrictions Precautions Precautions: Fall Restrictions Weight Bearing Restrictions: No      Mobility Bed Mobility Overal bed mobility: Needs Assistance             General bed mobility comments: Pt limited by lethargy and impaired command follow. Based on clinical judgement, she currently requires total assist for all bed mobility            ADL either performed or assessed with clinical judgement   ADL Overall ADL's : Needs assistance/impaired Eating/Feeding: Bed level;Total assistance   Grooming: Total assistance;Bed level   Upper Body Bathing: Total assistance;Bed level   Lower Body Bathing: Total assistance;Bed level   Upper Body Dressing : Total assistance;Bed level   Lower Body Dressing: Total assistance;Bed level       Toileting- Clothing Manipulation and Hygiene: Total assistance;Bed level         General ADL Comments: Pt was limited by significant lethargy during session. She often kept her eyes closed and presented with impaired command follow. Given this, her current ADL performance is largely based on clinical judgement and observed functional limitations. Based on clinical judgement, she requires total assist for ADLs at bed level currently      Pertinent Vitals/Pain Pain Assessment Pain Assessment: Faces Pain Score: 0-No pain     Extremity/Trunk Assessment Upper Extremity Assessment Upper Extremity Assessment: Difficult to assess due to impaired cognition RUE Deficits / Details: chronic arthritic changes of hands noted; pt able to demo gross grasp. required AAROM for shoulder LUE Deficits / Details: chronic arthritic changes of hands noted; pt able to demo  gross grasp. required AAROM for shoulder; chronic appearing elbow ROM  limitations, with decreased extension   Lower Extremity Assessment Lower Extremity Assessment: Generalized weakness (required AAROM to intermittent PROM for BLE)       Communication Communication Communication: Hearing impairment Cueing Techniques: Verbal cues;Tactile cues   Cognition Arousal: Lethargic Behavior During Therapy: Flat affect Overall Cognitive Status: Impaired/Different from baseline (per pt's daughter) Area of Impairment: Orientation, Following commands, Memory, Attention        Orientation Level: Disoriented to, Time, Situation             General Comments: Followed simple 1 step commands ~25% of the time, otherwise she required increased repetition of prompts and increased time in order to do so. Oriented to person and place; disoriented to time and situation. Pt kept eyes closed for most of the session; she would briefly open eyes to engage, then close them again; she could open eyes to verbal and tactile stimuli. Pt's daughter stated this is very different from pt's baseline                Home Living Family/patient expects to be discharged to:: Unsure Living Arrangements: Children (daughter who works) Available Help at Discharge: Family;Available PRN/intermittently Type of Home: House Home Access: Ramped entrance     Home Layout: One level     Bathroom Shower/Tub: Tub/shower unit         Home Equipment: Agricultural consultant (2 wheels);Wheelchair - manual;BSC/3in1   Additional Comments: Pt's daughter was present and provided info regarding pt's prior level of functioning and living situation, as pt was unable to do so, given lethargy and AMS. Per pt's daughter, pt used a rolling walker to ambulate in the home and wheelchair for community mobility. Pt was modified independent to independent with self-feeding, dressing, and toileting, however she required min assist for sponge bathing. The family managed cooking, cleaning, and driving.               OT Problem List: Decreased strength;Decreased range of motion;Decreased activity tolerance;Impaired balance (sitting and/or standing);Decreased cognition;Decreased knowledge of use of DME or AE;Impaired UE functional use      OT Treatment/Interventions: Self-care/ADL training;Therapeutic exercise;Energy conservation;DME and/or AE instruction;Therapeutic activities;Cognitive remediation/compensation;Visual/perceptual remediation/compensation;Patient/family education;Balance training    OT Goals(Current goals can be found in the care plan section) Acute Rehab OT Goals OT Goal Formulation: Patient unable to participate in goal setting Time For Goal Achievement: 09/18/22 Potential to Achieve Goals:  (pt currently with guarded prognosis) ADL Goals Pt Will Perform Eating: with set-up;sitting;with supervision Pt Will Perform Grooming: with set-up;with supervision;sitting Pt Will Perform Upper Body Bathing: with set-up;with supervision;sitting Pt Will Perform Upper Body Dressing: with set-up;with supervision;sitting Pt Will Transfer to Toilet: with min assist;ambulating;bedside commode  OT Frequency: Min 1X/week       AM-PAC OT "6 Clicks" Daily Activity     Outcome Measure Help from another person eating meals?: Total Help from another person taking care of personal grooming?: Total Help from another person toileting, which includes using toliet, bedpan, or urinal?: Total Help from another person bathing (including washing, rinsing, drying)?: Total Help from another person to put on and taking off regular upper body clothing?: Total Help from another person to put on and taking off regular lower body clothing?: Total 6 Click Score: 6   End of Session Equipment Utilized During Treatment: Oxygen Nurse Communication: Other (comment) (nurse cleared pt for therapy evaluation)  Activity Tolerance: Patient limited by lethargy Patient left: in bed;with call bell/phone  within reach;with bed alarm  set;with family/visitor present;with nursing/sitter in room  OT Visit Diagnosis: Muscle weakness (generalized) (M62.81);Feeding difficulties (R63.3);Other symptoms and signs involving cognitive function                Time: 1610-9604 OT Time Calculation (min): 24 min Charges:  OT General Charges $OT Visit: 1 Visit OT Evaluation $OT Eval Moderate Complexity: 1 Mod    Tycen Dockter L Calab Sachse, OTR/L 09/04/2022, 5:35 PM

## 2022-09-04 NOTE — TOC Initial Note (Addendum)
Transition of Care [facility]) - Initial/Assessment Note    Patient Details  Name: Jocelyn Bauer MRN: [id number] Date of Birth: 1928-12-14  Transition of Care [facility]) CM/SW Contact:    Howell Rucks, RN Phone Number: 09/04/2022, 11:52 AM  Clinical Narrative:   Call to pt's dtr [name]) to introduce role of TOC/NCM and review for dc planning. Per [name], family's dc plan is for short term rehab at [facility]. NCM discussed with request PT eval from attending for recommendation, [name] voiced understanding. [name] confirmed pt has a PCP and pharmacy in place, no current home care services, pt has a wheelchair, walker and potty chair at hom, on home 02-could not remember 02 provider.. NCM sent request to attending to enter PT eval, await recommendation. TOC will continue to follow.                 Expected Discharge Plan: Skilled Nursing Facility Barriers to Discharge: Continued Medical Work up   Patient Goals and CMS Choice Patient states their goals for this hospitalization and ongoing recovery are:: short term rehab at [facility] per pt's dtr [name])          Expected Discharge Plan and Services       Living arrangements for the past 2 months: Single Family Home                                      Prior Living Arrangements/Services Living arrangements for the past 2 months: Single Family Home Lives with:: Adult Children Patient language and need for interpreter reviewed:: Yes Do you feel safe going back to the place where you live?: Yes      Need for Family Participation in Patient Care: Yes (Comment) Care giver support system in place?: Yes (comment) Current home services: DME (wheelchair, walker, potty chair) Criminal Activity/Legal Involvement Pertinent to Current Situation/Hospitalization: No - Comment as needed  Activities of Daily Living Home Assistive Devices/Equipment: Eyeglasses, Environmental consultant (specify type), Wheelchair, Oxygen, Dentures (specify type) ADL Screening  (condition at time of admission) Patient's cognitive ability adequate to safely complete daily activities?: No Is the patient deaf or have difficulty hearing?: No Does the patient have difficulty seeing, even when wearing glasses/contacts?: No Does the patient have difficulty concentrating, remembering, or making decisions?: Yes Patient able to express need for assistance with ADLs?: Yes Does the patient have difficulty dressing or bathing?: No Independently performs ADLs?: No Communication: Needs assistance Is this a change from baseline?: Pre-admission baseline Dressing (OT): Needs assistance Is this a change from baseline?: Pre-admission baseline Grooming: Needs assistance Is this a change from baseline?: Pre-admission baseline Feeding: Needs assistance Is this a change from baseline?: Pre-admission baseline Bathing: Needs assistance Is this a change from baseline?: Pre-admission baseline Toileting: Needs assistance Is this a change from baseline?: Pre-admission baseline In/Out Bed: Needs assistance Is this a change from baseline?: Pre-admission baseline Walks in Home: Needs assistance Is this a change from baseline?: Pre-admission baseline Does the patient have difficulty walking or climbing stairs?: Yes Weakness of Legs: Both Weakness of Arms/Hands: Both  Permission Sought/Granted Permission sought to share information with : Case Manager Permission granted to share information with : Yes, Verbal Permission Granted  Share Information with NAME: Fannie Knee, RN           Emotional Assessment         Alcohol / Substance Use: Not Applicable Psych Involvement: No (comment)  Admission  diagnosis:  Aspiration pneumonitis (HCC) [J69.0] Patient Active Problem List   Diagnosis Date Noted   Aspiration pneumonitis (HCC) 09/03/2022   Delirium 09/03/2022   Fecal occult blood test positive 09/03/2022   Lung nodule 09/03/2022   Cellulitis of right lower extremity 05/26/2022    Anemia 05/26/2022   Shortness of breath 07/26/2021   Pneumonia due to infectious organism 06/28/2021   Palliative care encounter 06/25/2021   RA (rheumatoid arthritis) (HCC) 03/17/2021   DNR (do not resuscitate) 03/17/2021   Infected hardware in right leg (HCC)    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)    Contusion of left elbow 06/14/2020   Closed trimalleolar fracture of right ankle    Chronic, continuous use of opioids 07/28/2019   Right elbow pain 07/14/2015   Presence of right artificial elbow joint 07/13/2015   Hypertension    Hypothyroidism    Hyperlipidemia    GERD (gastroesophageal reflux disease)    Anxiety    Osteoporosis    Chronic neck and back pain    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   PCP:  Rodrigo Ran, MD Pharmacy:   [facility] [phone] - [place], [place] - [address] [place] [address] Phone: [phone] Fax: [phone]     Social Determinants of Health (SDOH) Social History: SDOH Screenings   Food Insecurity: No Food Insecurity (09/03/2022)  Housing: Low Risk  (09/03/2022)  Transportation Needs: No Transportation Needs (09/03/2022)  Utilities: Not At Risk (09/03/2022)  Depression (PHQ2-9): Low Risk  (02/05/2021)  Tobacco Use: Medium Risk (09/03/2022)   SDOH Interventions:     Readmission Risk Interventions    09/04/2022   11:50 AM  Readmission Risk Prevention Plan  Transportation Screening Complete  PCP or Specialist Appt within 3-5 Days Complete  HRI or Home Care Consult Complete  Social Work Consult for Recovery Care Planning/Counseling Complete  Palliative Care Screening Not Applicable  Medication Review Oceanographer) Complete

## 2022-09-04 NOTE — Progress Notes (Signed)
Bilateral lower extremity venous duplex has been completed. Preliminary results can be found in CV Proc through chart review.   09/04/22 10:11 AM Olen Cordial RVT

## 2022-09-04 NOTE — Hospital Course (Addendum)
89yo with h/o chronic deconditioning/ambulatory dysfunction in wheelchair presenting with AMS.  She developed dysuria a week ago and was treated with Bactrim and developed confusion, hallucinations, and generalized weakness.  She was noted to be 85% on room air, now stable on 2L [place] O2.  She was diagnosed with aspiration PNA and started on Ceftriaxone -> Unasyn and Azithromycin.  Hgb dropping, heme positive, very lethargic so concern for GI bleeding and given transfusion with dramatic improvement.  Now acknowledges black stools for some time.  Takes NSAIDs.  Overnight 8/30-31 had acute respiratory decompensation requiring NRB O2.  She is improving somewhat from that standpoint.  She is continuing to have large black stools.

## 2022-09-04 NOTE — Assessment & Plan Note (Addendum)
Wheelchair dependent at baseline but currently having more difficulty transferring PT/OT consults Appears likely to need SNF Family requests [facility], assuming she shows some improvement towards dc

## 2022-09-05 DIAGNOSIS — D649 Anemia, unspecified: Secondary | ICD-10-CM | POA: Diagnosis not present

## 2022-09-05 DIAGNOSIS — J189 Pneumonia, unspecified organism: Secondary | ICD-10-CM | POA: Diagnosis not present

## 2022-09-05 DIAGNOSIS — Z7189 Other specified counseling: Secondary | ICD-10-CM

## 2022-09-05 LAB — BPAM RBC
Blood Product Expiration Date: 202409102359
ISSUE DATE / TIME: 202408282301
Unit Type and Rh: 6200

## 2022-09-05 LAB — CBC WITH DIFFERENTIAL/PLATELET
Abs Immature Granulocytes: 0.23 10*3/uL — ABNORMAL HIGH (ref 0.00–0.07)
Basophils Absolute: 0 10*3/uL (ref 0.0–0.1)
Basophils Relative: 1 %
Eosinophils Absolute: 0.4 10*3/uL (ref 0.0–0.5)
Eosinophils Relative: 5 %
HCT: 27.5 % — ABNORMAL LOW (ref 36.0–46.0)
Hemoglobin: 8.3 g/dL — ABNORMAL LOW (ref 12.0–15.0)
Immature Granulocytes: 3 %
Lymphocytes Relative: 12 %
Lymphs Abs: 1 10*3/uL (ref 0.7–4.0)
MCH: 27.6 pg (ref 26.0–34.0)
MCHC: 30.2 g/dL (ref 30.0–36.0)
MCV: 91.4 fL (ref 80.0–100.0)
Monocytes Absolute: 1.4 10*3/uL — ABNORMAL HIGH (ref 0.1–1.0)
Monocytes Relative: 17 %
Neutro Abs: 5.2 10*3/uL (ref 1.7–7.7)
Neutrophils Relative %: 62 %
Platelets: 576 10*3/uL — ABNORMAL HIGH (ref 150–400)
RBC: 3.01 MIL/uL — ABNORMAL LOW (ref 3.87–5.11)
RDW: 18.1 % — ABNORMAL HIGH (ref 11.5–15.5)
WBC: 8.3 10*3/uL (ref 4.0–10.5)
nRBC: 0 % (ref 0.0–0.2)

## 2022-09-05 LAB — BASIC METABOLIC PANEL
Anion gap: 9 (ref 5–15)
BUN: 11 mg/dL (ref 8–23)
CO2: 25 mmol/L (ref 22–32)
Calcium: 7.9 mg/dL — ABNORMAL LOW (ref 8.9–10.3)
Chloride: 99 mmol/L (ref 98–111)
Creatinine, Ser: 0.71 mg/dL (ref 0.44–1.00)
GFR, Estimated: >60 mL/min (ref >60)
Glucose, Bld: 93 mg/dL (ref 70–99)
Potassium: 3.1 mmol/L — ABNORMAL LOW (ref 3.5–5.1)
Sodium: 133 mmol/L — ABNORMAL LOW (ref 135–145)

## 2022-09-05 LAB — TYPE AND SCREEN
ABO/RH(D): A POS
Antibody Screen: NEGATIVE
Unit division: 0

## 2022-09-05 LAB — HAPTOGLOBIN: Haptoglobin: 324 mg/dL (ref 41–333)

## 2022-09-05 MED ORDER — POTASSIUM CHLORIDE CRYS ER 20 MEQ PO TBCR
50.0000 meq | EXTENDED_RELEASE_TABLET | Freq: Once | ORAL | Status: AC
  Administered 2022-09-05: 50 meq via ORAL
  Filled 2022-09-05: qty 1

## 2022-09-05 MED ORDER — THIAMINE MONONITRATE 100 MG PO TABS
100.0000 mg | ORAL_TABLET | Freq: Every day | ORAL | Status: DC
  Administered 2022-09-06 – 2022-09-09 (×3): 100 mg via ORAL
  Filled 2022-09-05 (×3): qty 1

## 2022-09-05 MED ORDER — CHLORHEXIDINE GLUCONATE CLOTH 2 % EX PADS
6.0000 | MEDICATED_PAD | Freq: Every day | CUTANEOUS | Status: DC
  Administered 2022-09-05 – 2022-09-09 (×5): 6 via TOPICAL

## 2022-09-05 MED ORDER — POTASSIUM CHLORIDE CRYS ER 10 MEQ PO TBCR
10.0000 meq | EXTENDED_RELEASE_TABLET | Freq: Every day | ORAL | Status: DC
  Administered 2022-09-06 – 2022-09-09 (×3): 10 meq via ORAL
  Filled 2022-09-05 (×3): qty 1

## 2022-09-05 NOTE — Plan of Care (Signed)
  Problem: Education: Goal: Knowledge of General Education information will improve Description: Including pain rating scale, medication(s)/side effects and non-pharmacologic comfort measures Outcome: Not Progressing   Problem: Health Behavior/Discharge Planning: Goal: Ability to manage health-related needs will improve Outcome: Not Progressing   

## 2022-09-05 NOTE — Consult Note (Signed)
Consultation Note Date: 09/05/2022   Patient Name: Jocelyn Bauer  DOB: [birth year]  MRN: [id number]  Age / Sex: 89 y.o., female  PCP: Rodrigo Ran, MD Referring Physician: Jonah Blue, MD  Reason for Consultation:  goals of care  HPI/Patient Profile: 89 y.o. female  with past medical history of chronic osteomyelitis R lower extremity, rheumatoid arthritis, anxiety, HTN, HLD admitted on 09/03/2022 with aspiration pneumonia, anemia with hemoccult positive. Palliative medicine consulted for  goals of care.   Primary Decision Maker NEXT OF KIN - patient has several children who make decisions together  Discussion: Chart reviewed including labs, progress notes, imaging from this and previous encounters.  On eval patient resting comfortable, no distress.  Called patient's daughter, Jocelyn Bauer. Per Jocelyn Bauer she and her siblings make medical decisions for patient together. Patient's daughter lives with her mother.  Jocelyn Bauer is wheelchair bound.  Jocelyn Bauer noted that patient had been declining over the last two weeks, getting weaker- Jocelyn Bauer assumed she had a UTI.  I introduced Palliative medicine. Palliative medicine is specialized medical care for people living with serious illness. It focuses on providing relief from the symptoms and stress of a serious illness. The goal is to improve quality of life for both the patient and the family. Discussed patient's current acute and chronic illness.  Jocelyn Bauer expressed that she would like all of her siblings to gather and be present for an in person meeting and would like me to call Jocelyn Bauer to coordinate a time.      SUMMARY OF RECOMMENDATIONS -Family requests in person meeting with all siblings present -Meeting scheduled for 10am tomorrow    Code Status/Advance Care Planning: DNR   Prognosis:   Unable to determine  Discharge Planning: To Be Determined  Primary  Diagnoses: Present on Admission:  Multifocal pneumonia  Delirium  Symptomatic anemia  Lung nodule  RA (rheumatoid arthritis) (HCC)  Chronic neck and back pain  Occult blood in stools  Ambulatory dysfunction   Review of Systems  Physical Exam  Vital Signs: BP 130/61 (BP Location: Right Arm)   Pulse 85   Temp 97.8 F (36.6 C) (Oral)   Resp 16   Ht 5\' 2"  (1.575 m)   Wt 60.8 kg   SpO2 93%   BMI 24.52 kg/m  Pain Scale: 0-10   Pain Score: Asleep   SpO2: SpO2: 93 % O2 Device:SpO2: 93 % O2 Flow Rate: .O2 Flow Rate (L/min): 3 L/min  IO: Intake/output summary:  Intake/Output Summary (Last 24 hours) at 09/05/2022 1513 Last data filed at 09/05/2022 1157 Gross per 24 hour  Intake 621 ml  Output 1175 ml  Net -554 ml    LBM: Last BM Date : 09/01/22 Baseline Weight: Weight: 60.8 kg Most recent weight: Weight: 60.8 kg       Thank you for this consult. Palliative medicine will continue to follow and assist as needed.  Time Total: 60 mins Greater than 50%  of this time was spent counseling and coordinating care related to the above assessment and plan.  Signed by: Ocie Bob, AGNP-C Palliative Medicine    Please contact Palliative Medicine Team phone at [phone] for questions and concerns.  For individual provider: See Amion              .

## 2022-09-05 NOTE — Progress Notes (Signed)
Progress Note   Patient: Jocelyn Bauer WUJ:[id number] DOB: December 21, 1928 DOA: 09/03/2022     2 DOS: the patient was seen and examined on 09/05/2022   Brief hospital course: 89yo with h/o chronic deconditioning/ambulatory dysfunction in wheelchair presenting with AMS.  She developed dysuria a week ago and was treated with Bactrim and developed confusion, hallucinations, and generalized weakness.  She was noted to be 85% on room air, now stable on 2L [place] O2.  She was diagnosed with aspiration PNA and started on Ceftriaxone -> Unasyn and Azithromycin.  Hgb dropping, heme positive, very lethargic so concern for GI bleeding and given transfusion with dramatic improvement.  Now acknowledges black stools for some time.  Takes NSAIDs.  Assessment and Plan: * Symptomatic anemia Patient with Hgb 9.6 in May, on admission 7.5 -> 7.1 and heme positive In talking with her daughter, primary concerns were AMS, DOE, and fatigue - c/w symptomatic anemia Concern for GI bleed, but given her age and frailty, further evaluation may not be reasonable (see below) Family amenable to transfusion Transfused 1 unit PRBC  Hgb improved to 8.3 and patient dramatically improved clinically Recheck CBC in AM  Ambulatory dysfunction Wheelchair dependent at baseline but currently having more difficulty transferring PT/OT consults Appears likely to need SNF Family requests [facility]  Lung nodule Increased size. Will need outpatient work up in this regard.  Occult blood in stools Patient presenting with likely upper GI bleeding There was no report of frank bleeding or melena from patient or family on presentation but patient now acknowledges that her stools have been very black For now, will start Protonix IV daily and continue to follow without intervention If ongoing bleeding, consider risks/benefits of EGD Needs avoidance of all NSAIDs  Delirium Reports chronic episodes of patient having confusion with various  infections.   Currently decompensated, possibly from infection and/or symptomatic anemia CT head is negative Hold narcotics as much as possible Taper ativan to 0.25 mg bid. I would favor ceasing this medication over the long term. Add delirium precautions Significantly improved with halt in chronic pain medications as well as post-transfusion  Multifocal pneumonia She is on 2L home O2 but has had worsening SOB for several weeks that worsened after she vomited recently Negative COVID and RVP testing Some concern for aspiration, will request SLP evaluation Given ceftriaxone and azithromycin in the ER, changed to Unasyn and azithromycin.   She is on methotrexate and prednisone and so is immunocompromised as well.  DNR (do not resuscitate) Patient is DNR There was vague consideration of transition to comfort only should she not improve, but she is significantly improved today  RA (rheumatoid arthritis) (HCC) Hold methotrexate, continue with prednisone 5 mg daily.  Patient should get stress dose steroids in case of any vital instability.  Chronic neck and back pain Resume home dose of Fentanyl patch (this was decreased on admission but is a long-acting medication and so this change was unlikely to affect significant change) Separate oxy IR dosing from Tylenol - continue both medications but give Oxy only prn if Tylenol and fentanyl aren't effective I have reviewed this patient in the [place] Controlled Substances Reporting System.  She is receiving medications from multiple providers and IS at particularly high risk of opioid misuse, diversion, or overdose.     Consultants: Palliative care PT/OT SLP [facility] team   Procedures: None   Antibiotics: Ceftriaxone 8/27 Unasyn 8/27- Azithromycin 8/27-31  30 Day Unplanned Readmission Risk Score    Flowsheet Row ED to  Hosp-Admission (Current) from 09/03/2022 in [facility] 4TH FLOOR PROGRESSIVE CARE AND UROLOGY  30 Day Unplanned Readmission Risk  Score (%) 26.32 Filed at 09/05/2022 0801       This score is the patient's risk of an unplanned readmission within 30 days of being discharged (0 -100%). The score is based on dignosis, age, lab data, medications, orders, and past utilization.   Low:  0-14.9   Medium: 15-21.9   High: 22-29.9   Extreme: 30 and above           Subjective: Much better today - sitting up in chair, eating lunch, on home [place] O2   Objective: Vitals:   09/05/22 0159 09/05/22 0708  BP: 131/61 130/61  Pulse: 82 85  Resp: 18 16  Temp: 98.3 F (36.8 C) 97.8 F (36.6 C)  SpO2: 93% 93%    Intake/Output Summary (Last 24 hours) at 09/05/2022 1849 Last data filed at 09/05/2022 1157 Gross per 24 hour  Intake 641 ml  Output 550 ml  Net 91 ml   Filed Weights   09/03/22 1357  Weight: 60.8 kg    Exam:  General:  Appears calm and comfortable and is in NAD, holding some food in her mouth and spitting out later but reports that this is due to lack of lower dentures Eyes:  EOMI, normal lids, iris ENT:  grossly normal hearing, lips & tongue, mmm Neck:  no LAD, masses or thyromegaly Cardiovascular:  RRR, no m/r/g. No LE edema.  Respiratory:   CTA bilaterally with no wheezes/rales/rhonchi.  Normal respiratory effort. Abdomen:  soft, NT, ND Skin:  no rash or induration seen on limited exam Musculoskeletal:  grossly normal tone BUE/BLE, good ROM, no bony abnormality Psychiatric:  somnolent mood and affect, speech sparse but appropriate Neurologic:  CN 2-12 grossly intact, moves all extremities in coordinated fashion  Data Reviewed: I have reviewed the patient's lab results since admission.  Pertinent labs for today include:  Na++ 133 K+ 3.1 WBC 8.3 Hgb 8.3, up from 7.1 Platelets 576    Family Communication: DIL present throughout evaluation  Disposition: Status is: Inpatient Remains inpatient appropriate because: ongoing evaluation and treatment  Planned Discharge Destination: Home    Time  spent: 50 minutes  Author: Jonah Blue, MD 09/05/2022 6:49 PM  For on call review [email or web address].

## 2022-09-05 NOTE — TOC Progression Note (Signed)
Transition of Care [facility]) - Progression Note    Patient Details  Name: CARNETTA DEVEAU MRN: [id number] Date of Birth: Jun 09, 1928  Transition of Care [facility]) CM/SW Contact  Howell Rucks, RN Phone Number: 09/05/2022, 3:50 PM  Clinical Narrative:   PT eval completed, recommendation for short term rehab-SNF, call to pt's dtr Dondra Spry, agreeable, prefers [facility]. FL2 completed, faxed out for bed offers.     Expected Discharge Plan: Skilled Nursing Facility Barriers to Discharge: Continued Medical Work up  Expected Discharge Plan and Services       Living arrangements for the past 2 months: Single Family Home                                       Social Determinants of Health (SDOH) Interventions SDOH Screenings   Food Insecurity: No Food Insecurity (09/03/2022)  Housing: Low Risk  (09/03/2022)  Transportation Needs: No Transportation Needs (09/03/2022)  Utilities: Not At Risk (09/03/2022)  Depression (PHQ2-9): Low Risk  (02/05/2021)  Tobacco Use: Medium Risk (09/03/2022)    Readmission Risk Interventions    09/04/2022   11:50 AM  Readmission Risk Prevention Plan  Transportation Screening Complete  PCP or Specialist Appt within 3-5 Days Complete  HRI or Home Care Consult Complete  Social Work Consult for Recovery Care Planning/Counseling Complete  Palliative Care Screening Not Applicable  Medication Review [employer]) Complete

## 2022-09-05 NOTE — Assessment & Plan Note (Addendum)
Patient is DNR Various family members have discussed the idea of transition to comfort care but palliative care meeting today was held and I spoke with her daughter who says she absolutely wants to treat the treatable I also spoke with her other daughter, who appears to be recognizing the severity of her mother's condition

## 2022-09-05 NOTE — Plan of Care (Signed)
  Problem: Clinical Measurements: Goal: Will remain free from infection Outcome: Progressing Goal: Diagnostic test results will improve Outcome: Progressing   Problem: Activity: Goal: Risk for activity intolerance will decrease Outcome: Progressing   Problem: Coping: Goal: Level of anxiety will decrease Outcome: Progressing   Problem: Pain Managment: Goal: General experience of comfort will improve Outcome: Progressing

## 2022-09-05 NOTE — NC FL2 (Signed)
[place] MEDICAID FL2 LEVEL OF CARE FORM     IDENTIFICATION  Patient Name: Jocelyn Bauer Birthdate: 24-Aug-1928 Sex: female Admission Date (Current Location): 09/03/2022  [facility] and [place] Number:  Producer, television/film/video and Address:  [facility],  [address], [place] [address]      Provider Number: [id number]  Attending Physician Name and Address:  Jonah Blue, MD  Relative Name and Phone Number:  Jolyn Nap (Daughter)  [phone] [facility] Phone)    Current Level of Care: Hospital Recommended Level of Care: Skilled Nursing Facility Prior Approval Number:    Date Approved/Denied:   PASRR Number:    Discharge Plan: SNF    Current Diagnoses: Patient Active Problem List   Diagnosis Date Noted   Ambulatory dysfunction 09/04/2022   Multifocal pneumonia 09/03/2022   Delirium 09/03/2022   Occult blood in stools 09/03/2022   Lung nodule 09/03/2022   Cellulitis of right lower extremity 05/26/2022   Symptomatic anemia 05/26/2022   Shortness of breath 07/26/2021   Pneumonia due to infectious organism 06/28/2021   Palliative care encounter 06/25/2021   RA (rheumatoid arthritis) (HCC) 03/17/2021   DNR (do not resuscitate) 03/17/2021   Infected hardware in right leg (HCC)    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)    Contusion of left elbow 06/14/2020   Closed trimalleolar fracture of right ankle    Chronic, continuous use of opioids 07/28/2019   Right elbow pain 07/14/2015   Presence of right artificial elbow joint 07/13/2015   Hypertension    Hypothyroidism    Hyperlipidemia    GERD (gastroesophageal reflux disease)    Anxiety    Osteoporosis    Chronic neck and back pain    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    Orientation RESPIRATION BLADDER Height & Weight     Self, Time  O2 External catheter Weight: 60.8 kg Height:  5\' 2"  (157.5 cm)  BEHAVIORAL SYMPTOMS/MOOD NEUROLOGICAL BOWEL NUTRITION STATUS      Continent  Diet (regular)  AMBULATORY STATUS COMMUNICATION OF NEEDS Skin   Extensive Assist Verbally Other (Comment) (echymosis/redness on bilateral arms, legs, and in groin area)                       Personal Care Assistance Level of Assistance  Bathing, Feeding, Dressing Bathing Assistance: Maximum assistance Feeding assistance: Maximum assistance Dressing Assistance: Maximum assistance     Functional Limitations Info  Sight, Hearing, Speech Sight Info: Impaired Hearing Info: Impaired Speech Info: Adequate    SPECIAL CARE FACTORS FREQUENCY  PT (By licensed PT), OT (By licensed OT)     PT Frequency: 5x/wk OT Frequency: 5x/wk            Contractures Contractures Info: Not present    Additional Factors Info  Code Status, Allergies, Psychotropic Code Status Info: DNR Allergies Info: Streptococcus (Diplococcus) Pneumoniae (Streptococci), Contrast Media (Iodinated Contrast Media), Tdap (Tetanus-diphth-acell Pertussis), Cholestyramine, Duloxetine Hcl, Etidronate, Ezetimibe-simvastatin, Hydromorphone, Pravastatin Psychotropic Info: Zoloft 50mg  po daily         Current Medications (09/05/2022):  This is the current hospital active medication list Current Facility-Administered Medications  Medication Dose Route Frequency Provider Last Rate Last Admin   acetaminophen (TYLENOL) tablet 650 mg  650 mg Oral Q6H PRN Nolberto Hanlon, MD   650 mg at 09/04/22 1006   Or   acetaminophen (TYLENOL) suppository 650 mg  650 mg Rectal Q6H PRN Nolberto Hanlon, MD  albuterol (PROVENTIL) (2.5 MG/3ML) 0.083% nebulizer solution 2.5 mg  2.5 mg Nebulization Q2H PRN Nolberto Hanlon, MD   2.5 mg at 09/04/22 1042   amLODipine (NORVASC) tablet 5 mg  5 mg Oral Daily Nolberto Hanlon, MD   5 mg at 09/05/22 0955   Ampicillin-Sulbactam (UNASYN) 3 g in sodium chloride 0.9 % 100 mL IVPB  3 g Intravenous Q6H Wofford, Drew A, RPH 200 mL/hr at 09/05/22 1157 3 g at 09/05/22 1157   aspirin EC tablet 81 mg  81 mg Oral Daily Nolberto Hanlon, MD   81 mg at 09/05/22 0955   azithromycin (ZITHROMAX) 500 mg in sodium chloride 0.9 % 250 mL IVPB  500 mg Intravenous Q24H Nolberto Hanlon, MD 250 mL/hr at 09/04/22 2137 500 mg at 09/04/22 2137   benazepril (LOTENSIN) tablet 20 mg  20 mg Oral q morning Nolberto Hanlon, MD   20 mg at 09/05/22 0955   Chlorhexidine Gluconate Cloth 2 % PADS 6 each  6 each Topical Q0600 Jonah Blue, MD   6 each at 09/05/22 1157   cholecalciferol (VITAMIN D3) 25 MCG (1000 UNIT) tablet 2,000 Units  2,000 Units Oral Daily Nolberto Hanlon, MD   2,000 Units at 09/05/22 0955   fentaNYL (DURAGESIC) 50 MCG/HR 1 patch  1 patch Transdermal Vickie Epley, MD       guaiFENesin (MUCINEX) 12 hr tablet 600 mg  600 mg Oral BID PRN Jonah Blue, MD       levothyroxine (SYNTHROID) tablet 125 mcg  125 mcg Oral QAC breakfast Nolberto Hanlon, MD   125 mcg at 09/05/22 0610   LORazepam (ATIVAN) tablet 0.25 mg  0.25 mg Oral BID Nolberto Hanlon, MD   0.25 mg at 09/05/22 6578   methocarbamol (ROBAXIN) tablet 500 mg  500 mg Oral BID PRN Nolberto Hanlon, MD       oxyCODONE (Oxy IR/ROXICODONE) immediate release tablet 5 mg  5 mg Oral Q6H PRN Nolberto Hanlon, MD   5 mg at 09/05/22 1155   pantoprazole (PROTONIX) injection 40 mg  40 mg Intravenous Q24H Jonah Blue, MD   40 mg at 09/05/22 0955   polyethylene glycol (MIRALAX / GLYCOLAX) packet 17 g  17 g Oral Daily PRN Nolberto Hanlon, MD       Melene Muller ON 09/06/2022] potassium chloride (KLOR-CON M) CR tablet 10 mEq  10 mEq Oral Daily Jonah Blue, MD       predniSONE (DELTASONE) tablet 5 mg  5 mg Oral Daily Nolberto Hanlon, MD   5 mg at 09/05/22 4696   senna-docusate (Senokot-S) tablet 2 tablet  2 tablet Oral BID Nolberto Hanlon, MD   2 tablet at 09/05/22 0955   sertraline (ZOLOFT) tablet 50 mg  50 mg Oral Daily Nolberto Hanlon, MD   50 mg at 09/05/22 0955   sodium chloride flush (NS) 0.9 % injection 3 mL  3 mL Intravenous Q12H Nolberto Hanlon, MD   3 mL at 09/05/22 1157   [START ON 09/06/2022] thiamine (VITAMIN B1) tablet  100 mg  100 mg Oral Daily Jonah Blue, MD         Discharge Medications: Please see discharge summary for a list of discharge medications.  Relevant Imaging Results:  Relevant Lab Results:   Additional Information SSN [id number]  Howell Rucks, RN

## 2022-09-05 NOTE — Evaluation (Signed)
Physical Therapy Evaluation Patient Details Name: Jocelyn Bauer MRN: [id number] DOB: 16-Dec-1928 Today's Date: 09/05/2022  History of Present Illness  Pt is a 89 yr old female admitted to the hospital with altered mental status and weakness.Dx of PNA, anemia, GIB. PMH: panic attacks, RA, bilateral ankle fractures, chronic pain, HTN, OA, OP  Clinical Impression  Pt admitted with above diagnosis. Pt required mod to max assist to stand and pivot to a bedside commode, then to bed. Pt c/o severe perineal pain while having a very large, firm BM and with perineal cleanup. Pt not able to state her birthdate, but knows she's in a hospital. Patient will benefit from continued inpatient follow up therapy, <3 hours/day. Pt currently with functional limitations due to the deficits listed below (see PT Problem List). Pt will benefit from acute skilled PT to increase their independence and safety with mobility to allow discharge.           If plan is discharge home, recommend the following: A lot of help with bathing/dressing/bathroom;A lot of help with walking and/or transfers;Assistance with cooking/housework;Assist for transportation;Help with stairs or ramp for entrance   Can travel by private vehicle   No    Equipment Recommendations None recommended by PT  Recommendations for Other Services       Functional Status Assessment Patient has had a recent decline in their functional status and demonstrates the ability to make significant improvements in function in a reasonable and predictable amount of time.     Precautions / Restrictions Precautions Precautions: Fall Restrictions Weight Bearing Restrictions: No      Mobility  Bed Mobility Overal bed mobility: Needs Assistance Bed Mobility: Sit to Supine       Sit to supine: Mod assist   General bed mobility comments: assist for BLEs into bed    Transfers Overall transfer level: Needs assistance Equipment used: Rolling walker (2  wheels) Transfers: Sit to/from Stand, Bed to chair/wheelchair/BSC Sit to Stand: Mod assist, Max assist   Step pivot transfers: Mod assist       General transfer comment: VCs hand placement, assist to power up, increased time. stand pivot transfer x 2 (recliner to [facility], then to bed)    Ambulation/Gait                  Stairs            Wheelchair Mobility     Tilt Bed    Modified Rankin (Stroke Patients Only)       Balance Overall balance assessment: Needs assistance Sitting-balance support: Feet supported, No upper extremity supported Sitting balance-Leahy Scale: Fair     Standing balance support: Bilateral upper extremity supported, During functional activity, Reliant on assistive device for balance Standing balance-Leahy Scale: Poor                               Pertinent Vitals/Pain Pain Assessment Faces Pain Scale: Hurts whole lot Pain Location: perineal area with wiping after having BM Pain Descriptors / Indicators: Moaning Pain Intervention(s): Limited activity within patient's tolerance, Monitored during session    Home Living Family/patient expects to be discharged to:: Unsure Living Arrangements: Children (daughter who works) Available Help at Discharge: Family;Available PRN/intermittently Type of Home: House Home Access: Ramped entrance       Home Layout: One level Home Equipment: Agricultural consultant (2 wheels);Wheelchair - manual;BSC/3in1 Additional Comments: lives with daughter who works    Prior  Function               Mobility Comments: per OT Eval, daughter reported pt walks wtih a RW in the home, uses WC when going out. ADLs Comments: assist for sponge bathing, independent toileting, family does cooking/cleaning/driving.     Extremity/Trunk Assessment   Upper Extremity Assessment RUE Deficits / Details: chronic arthritic changes of hands noted; pt able to demo gross grasp. required AAROM for shoulder LUE Deficits  / Details: chronic arthritic changes of hands noted; pt able to demo gross grasp. required AAROM for shoulder; chronic appearing elbow ROM limitations, with decreased extension    Lower Extremity Assessment Lower Extremity Assessment: Generalized weakness    Cervical / Trunk Assessment Cervical / Trunk Assessment: Kyphotic  Communication      Cognition                                                General Comments      Exercises     Assessment/Plan    PT Assessment Patient needs continued PT services  PT Problem List Decreased mobility;Decreased activity tolerance;Decreased balance;Decreased cognition       PT Treatment Interventions Gait training;Functional mobility training;Therapeutic activities;Therapeutic exercise;Patient/family education;DME instruction;Balance training    PT Goals (Current goals can be found in the Care Plan section)  Acute Rehab PT Goals PT Goal Formulation: Patient unable to participate in goal setting Time For Goal Achievement: 09/19/22 Potential to Achieve Goals: Fair    Frequency Min 1X/week     Co-evaluation               AM-PAC PT "6 Clicks" Mobility  Outcome Measure Help needed turning from your back to your side while in a flat bed without using bedrails?: A Lot Help needed moving from lying on your back to sitting on the side of a flat bed without using bedrails?: A Lot Help needed moving to and from a bed to a chair (including a wheelchair)?: A Lot Help needed standing up from a chair using your arms (e.g., wheelchair or bedside chair)?: A Lot Help needed to walk in hospital room?: Total Help needed climbing 3-5 steps with a railing? : Total 6 Click Score: 10    End of Session Equipment Utilized During Treatment: Gait belt Activity Tolerance: Patient limited by fatigue;Patient limited by pain Patient left: in bed;with call bell/phone within reach;with bed alarm set;with family/visitor present Nurse  Communication: Mobility status PT Visit Diagnosis: Difficulty in walking, not elsewhere classified (R26.2);Pain;Other abnormalities of gait and mobility (R26.89);Muscle weakness (generalized) (M62.81)    Time: 4098-1191 PT Time Calculation (min) (ACUTE ONLY): 21 min   Charges:   PT Evaluation $PT Eval Moderate Complexity: 1 Mod   PT General Charges $$ ACUTE PT VISIT: 1 Visit         Tamala Ser PT 09/05/2022  Acute Rehabilitation Services  Office [phone]

## 2022-09-05 NOTE — Evaluation (Signed)
Clinical/Bedside Swallow Evaluation Patient Details  Name: Jocelyn Bauer MRN: [id number] Date of Birth: Jul 24, 1928  Today's Date: 09/05/2022 Time: SLP Start Time (ACUTE ONLY): 1135 SLP Stop Time (ACUTE ONLY): 1210 SLP Time Calculation (min) (ACUTE ONLY): 35 min  Past Medical History:  Past Medical History:  Diagnosis Date   Anxiety    HX OF PANIC ATTACKS   Arthritis    RA   Bilateral ankle fractures 07/26/2019   Chronic neck and back pain    Chronic pain    Chronic, continuous use of opioids 07/28/2019   Elbow locking    due to RA- elbow with limited extension   GERD (gastroesophageal reflux disease)    Headache    Hyperlipidemia    Hypertension    Hypothyroidism    Osteoarthritis    Osteoporosis    Pneumonia    Shortness of breath    occas   UTI (urinary tract infection)    Past Surgical History:  Past Surgical History:  Procedure Laterality Date   CATARACT EXTRACTION Bilateral    COLONOSCOPY     ELBOW SURGERY Right    ELBOW SURGERY Left    ESOPHAGOGASTRODUODENOSCOPY     HARDWARE REMOVAL Right 07/13/2015   Procedure:  WITH HARDWARE REMOVAL REPAIR AS NECESSARY;  Surgeon: Dominica Severin, MD;  Location: [facility] OR;  Service: Orthopedics;  Laterality: Right;   HARDWARE REMOVAL Right 01/22/2021   Procedure: HARDWARE REMOVAL;  Surgeon: Myrene Galas, MD;  Location: [facility] OR;  Service: Orthopedics;  Laterality: Right;   JOINT REPLACEMENT  1998   left hip replacement and bilateral elbow replacements  about 15 yrs ago   ORIF ANKLE FRACTURE Bilateral 07/27/2019   Procedure: OPEN REDUCTION INTERNAL FIXATION (ORIF) ANKLE FRACTURE;  Surgeon: Myrene Galas, MD;  Location: [facility] OR;  Service: Orthopedics;  Laterality: Bilateral;   thyroidectomy     TOTAL ELBOW ARTHROPLASTY Right 07/13/2015   Procedure: RIGHT ELBOW REVISION TOTAL ELBOW ARTHROPLASTY ;  Surgeon: Dominica Severin, MD;  Location: [facility] OR;  Service: Orthopedics;  Laterality: Right;   TOTAL HIP ARTHROPLASTY     TOTAL HIP REVISION   03/11/2011   Procedure: TOTAL HIP REVISION;  Surgeon: [name], MD;  Location: [facility] ORS;  Service: Orthopedics;  Laterality: Left;   TRICEPS TENDON REPAIR Right 07/13/2015   Procedure:  TRICEP RECONSTRUCTION REPAIR WITH ALLOGRAFT, ULNAR NERVE NEUROLYSIS;  Surgeon: Dominica Severin, MD;  Location: [facility] OR;  Service: Orthopedics;  Laterality: Right;   HPI:  89 yo female adm to [facility] with AMS, weakness.  Patient has become progressively weak over the last 7 days but especially over the last 48 hours prompting the family to bring the patient to the hospital.  PMH: panic attacks, RA, bilateral ankle fractures, chronic pain, HTN, OA, OP, some independence PTA. CXR  Bilateral airspace disease greatest in the right upper lobe, which could reflect multifocal bronchopneumonia or asymmetric edema.  MBS 06/2021 functional oropharyngeal swallow, prom cp and cervical osteophyte did not impair barium flow, no asp/penetration, minimal esoph retention. Swallow eval ordered.    Assessment / Plan / Recommendation  Clinical Impression  Patiently currently presents with overall functional swallow ability with potential minimal oral deficits due to poor dentition.  Prolonged mastication noted over several minutes which she benefited from using applesauce to aid oral transit and clearance.  No clinical indication of aspiration nor airway compromise with p.o. observed. Patient does endorse problems with swallowing pills has felt like she almost choked on it in the past. She states that  taking them with applesauce is easier.  Observed RN providing patient with medication with applesauce and patient used liquid to orally transit without difficulties.  Recommend continue diet as tolerated and no SLP follow-up. SLP Visit Diagnosis: Dysphagia, oral phase (R13.11)    Aspiration Risk  Mild aspiration risk    Diet Recommendation Thin liquid;Regular    Medication Administration: Whole meds with puree Supervision: Patient able to self  feed Compensations: Slow rate;Small sips/bites Postural Changes: Remain upright for at least 30 minutes after po intake;Seated upright at 90 degrees    Other  Recommendations Oral Care Recommendations: Oral care BID    Recommendations for follow up therapy are one component of a multi-disciplinary discharge planning process, led by the attending physician.  Recommendations may be updated based on patient status, additional functional criteria and insurance authorization.  Follow up Recommendations Follow physician's recommendations for discharge plan and follow up therapies      Assistance Recommended at Discharge  N/a  Functional Status Assessment Patient has had a recent decline in their functional status and demonstrates the ability to make significant improvements in function in a reasonable and predictable amount of time.  Frequency and Duration         N/a   Prognosis Prognosis for improved oropharyngeal function: Fair      Swallow Study   General Date of Onset: 09/05/22 HPI: 89 yo female adm to [facility] with AMS, weakness.  Patient has become progressively weak over the last 7 days but especially over the last 48 hours prompting the family to bring the patient to the hospital.  PMH: panic attacks, RA, bilateral ankle fractures, chronic pain, HTN, OA, OP, some independence PTA. CXR  Bilateral airspace disease greatest in the right upper lobe, which could reflect multifocal bronchopneumonia or asymmetric edema.  MBS 06/2021 functional oropharyngeal swallow, prom cp and cervical osteophyte did not impair barium flow, no asp/penetration, minimal esoph retention. Swallow eval ordered. Type of Study: Bedside Swallow Evaluation Diet Prior to this Study: Regular;Thin liquids (Level 0) Temperature Spikes Noted: No Respiratory Status: Nasal cannula History of Recent Intubation: No Behavior/Cognition: Alert;Cooperative Oral Cavity Assessment: Within Functional Limits Oral Care Completed by  SLP: No Oral Cavity - Dentition: Other (Comment);Poor condition;Missing dentition (Difficult to view due to minimal mouth opening) Vision: Functional for self-feeding Self-Feeding Abilities: Able to feed self Patient Positioning: Upright in chair Baseline Vocal Quality: Low vocal intensity Volitional Cough: Cognitively unable to elicit Volitional Swallow: Unable to elicit    Oral/Motor/Sensory Function Overall Oral Motor/Sensory Function: Within functional limits   Ice Chips Ice chips: Not tested   Thin Liquid Thin Liquid: Within functional limits Presentation: Self Fed;Straw    Nectar Thick Nectar Thick Liquid: Not tested   Honey Thick Honey Thick Liquid: Not tested   Puree Puree: Within functional limits Presentation: Self Fed;Spoon   Solid     Solid: Impaired Presentation: Self Fed Oral Phase Impairments: Reduced lingual movement/coordination;Impaired mastication Oral Phase Functional Implications: Prolonged oral transit Other Comments: pt used puree to help transit masticated solid     Rolena Infante, MS [facility] SLP Acute Rehab Services Office [phone]  Chales Abrahams 09/05/2022,12:30 PM

## 2022-09-06 DIAGNOSIS — J189 Pneumonia, unspecified organism: Secondary | ICD-10-CM | POA: Diagnosis not present

## 2022-09-06 DIAGNOSIS — M069 Rheumatoid arthritis, unspecified: Secondary | ICD-10-CM | POA: Diagnosis not present

## 2022-09-06 DIAGNOSIS — D649 Anemia, unspecified: Secondary | ICD-10-CM | POA: Diagnosis not present

## 2022-09-06 DIAGNOSIS — Z7189 Other specified counseling: Secondary | ICD-10-CM | POA: Diagnosis not present

## 2022-09-06 LAB — BASIC METABOLIC PANEL
Anion gap: 9 (ref 5–15)
BUN: 9 mg/dL (ref 8–23)
CO2: 23 mmol/L (ref 22–32)
Calcium: 7.8 mg/dL — ABNORMAL LOW (ref 8.9–10.3)
Chloride: 104 mmol/L (ref 98–111)
Creatinine, Ser: 0.71 mg/dL (ref 0.44–1.00)
GFR, Estimated: >60 mL/min (ref >60)
Glucose, Bld: 104 mg/dL — ABNORMAL HIGH (ref 70–99)
Potassium: 3.4 mmol/L — ABNORMAL LOW (ref 3.5–5.1)
Sodium: 136 mmol/L (ref 135–145)

## 2022-09-06 LAB — CBC WITH DIFFERENTIAL/PLATELET
Abs Immature Granulocytes: 0.39 10*3/uL — ABNORMAL HIGH (ref 0.00–0.07)
Basophils Absolute: 0.1 10*3/uL (ref 0.0–0.1)
Basophils Relative: 1 %
Eosinophils Absolute: 0.6 10*3/uL — ABNORMAL HIGH (ref 0.0–0.5)
Eosinophils Relative: 7 %
HCT: 27 % — ABNORMAL LOW (ref 36.0–46.0)
Hemoglobin: 8.3 g/dL — ABNORMAL LOW (ref 12.0–15.0)
Immature Granulocytes: 5 %
Lymphocytes Relative: 15 %
Lymphs Abs: 1.2 10*3/uL (ref 0.7–4.0)
MCH: 27.9 pg (ref 26.0–34.0)
MCHC: 30.7 g/dL (ref 30.0–36.0)
MCV: 90.9 fL (ref 80.0–100.0)
Monocytes Absolute: 1.4 10*3/uL — ABNORMAL HIGH (ref 0.1–1.0)
Monocytes Relative: 18 %
Neutro Abs: 4.2 10*3/uL (ref 1.7–7.7)
Neutrophils Relative %: 54 %
Platelets: 534 10*3/uL — ABNORMAL HIGH (ref 150–400)
RBC: 2.97 MIL/uL — ABNORMAL LOW (ref 3.87–5.11)
RDW: 18.7 % — ABNORMAL HIGH (ref 11.5–15.5)
WBC: 7.9 10*3/uL (ref 4.0–10.5)
nRBC: 0 % (ref 0.0–0.2)

## 2022-09-06 LAB — CBC
HCT: 27 % — ABNORMAL LOW (ref 36.0–46.0)
Hemoglobin: 8 g/dL — ABNORMAL LOW (ref 12.0–15.0)
MCH: 27.2 pg (ref 26.0–34.0)
MCHC: 29.6 g/dL — ABNORMAL LOW (ref 30.0–36.0)
MCV: 91.8 fL (ref 80.0–100.0)
Platelets: 530 10*3/uL — ABNORMAL HIGH (ref 150–400)
RBC: 2.94 MIL/uL — ABNORMAL LOW (ref 3.87–5.11)
RDW: 18.8 % — ABNORMAL HIGH (ref 11.5–15.5)
WBC: 6.7 10*3/uL (ref 4.0–10.5)
nRBC: 0 % (ref 0.0–0.2)

## 2022-09-06 NOTE — Progress Notes (Signed)
Progress Note   Patient: Jocelyn Bauer ZOX:[id number] DOB: [birth year] DOA: 09/03/2022     3 DOS: the patient was seen and examined on 09/06/2022   Brief hospital course: 89yo with h/o chronic deconditioning/ambulatory dysfunction in wheelchair presenting with AMS.  She developed dysuria a week ago and was treated with Bactrim and developed confusion, hallucinations, and generalized weakness.  She was noted to be 85% on room air, now stable on 2L [place] O2.  She was diagnosed with aspiration PNA and started on Ceftriaxone -> Unasyn and Azithromycin.  Hgb dropping, heme positive, very lethargic so concern for GI bleeding and given transfusion with dramatic improvement.  Now acknowledges black stools for some time.  Takes NSAIDs.  Assessment and Plan: * Symptomatic anemia Patient with Hgb 9.6 in May, on admission 7.5 -> 7.1 and heme positive In talking with her daughter, primary concerns were AMS, DOE, and fatigue - c/w symptomatic anemia Concern for GI bleed, but given her age and frailty, further evaluation may not be reasonable (see below) Family amenable to transfusion Transfused 1 unit PRBC  Hgb improved to 8.3 and patient dramatically improved clinically Hgb this AM also 8.3 but she has been very somnolent all day She has had 2 large BMs today, both formed but also both dark Recheck CBC now and transfuse if needed for Hgb <7  Ambulatory dysfunction Wheelchair dependent at baseline but currently having more difficulty transferring PT/OT consults Appears likely to need SNF Family requests [facility]  Lung nodule Increased size. Will need outpatient work up in this regard.  Occult blood in stools Patient presenting with likely upper GI bleeding There was no report of frank bleeding or melena from patient or family on presentation but patient now acknowledges that her stools have been very black She had 2 additional tarry stools today Continue Protonix IV daily If ongoing bleeding,  consider risks/benefits of EGD - however at this time family would not consider this option given her extremely high risk of poor outcome so will hold on GI consult Needs avoidance of all NSAIDs  Delirium Reports chronic episodes of patient having confusion with various infections.   Currently decompensated, possibly from infection and/or symptomatic anemia CT head is negative Hold narcotics as much as possible Taper ativan to 0.25 mg bid. I would favor ceasing this medication over the long term. Add delirium precautions Significantly improved with halt in chronic pain medications as well as post-transfusion on 8/29 Markedly somnolent but arousable again on 8/30  Multifocal pneumonia She is on 2L home O2 but has had worsening SOB for several weeks that worsened after she vomited recently Negative COVID and RVP testing Some concern for aspiration, will request SLP evaluation Given ceftriaxone and azithromycin in the ER, changed to Unasyn and azithromycin.   She is on methotrexate and prednisone and so is immunocompromised as well.  DNR (do not resuscitate) Patient is DNR Various family members have discussed the idea of transition to comfort care but palliative care meeting today was held and I spoke with her daughter who says she absolutely wants to treat the treatable  RA (rheumatoid arthritis) (HCC) Hold methotrexate, continue with prednisone 5 mg daily.  Patient should get stress dose steroids in case of any vital instability.  Chronic neck and back pain Resume home dose of Fentanyl patch (this was decreased on admission but is a long-acting medication and so this change was unlikely to affect significant change) Separate oxy IR dosing from Tylenol - continue both medications but give  Oxy only prn if Tylenol and fentanyl aren't effective I have reviewed this patient in the [place] Controlled Substances Reporting System.  She is receiving medications from multiple providers and IS at  particularly high risk of opioid misuse, diversion, or overdose.       Consultants: Palliative care PT/OT SLP TOC team   Procedures: None   Antibiotics: Ceftriaxone 8/27 Unasyn 8/27- Azithromycin 8/27-31  30 Day Unplanned Readmission Risk Score    Flowsheet Row ED to Hosp-Admission (Current) from 09/03/2022 in [facility] 4TH FLOOR PROGRESSIVE CARE AND UROLOGY  30 Day Unplanned Readmission Risk Score (%) 27.41 Filed at 09/06/2022 0801       This score is the patient's risk of an unplanned readmission within 30 days of being discharged (0 -100%). The score is based on dignosis, age, lab data, medications, orders, and past utilization.   Low:  0-14.9   Medium: 15-21.9   High: 22-29.9   Extreme: 30 and above           Subjective: Very somnolent today.  No specific complaints.  2 large tarry BMs today.   Objective: Vitals:   09/06/22 1311 09/06/22 1621  BP: 126/64   Pulse: 88   Resp: 18   Temp:    SpO2: 97% 96%    Intake/Output Summary (Last 24 hours) at 09/06/2022 1759 Last data filed at 09/06/2022 1727 Gross per 24 hour  Intake 180 ml  Output 1150 ml  Net -970 ml   Filed Weights   09/03/22 1357  Weight: 60.8 kg    Exam:  General:  Hypersomnolent today but arouses to voice and touch and answers limited questions Eyes:  normal lids, iris ENT:  grossly normal hearing, lips & tongue, mmm, upper dentures only Neck:  no LAD, masses or thyromegaly Cardiovascular:  RRR, no m/r/g. No LE edema.  Respiratory:   CTA bilaterally with no wheezes/rales/rhonchi.  Normal respiratory effort. Abdomen:  soft, NT, ND Skin:  no rash or induration seen on limited exam Musculoskeletal:  grossly normal tone BUE/BLE, good ROM, no bony abnormality Psychiatric:  somnolent mood and affect, speech sparse but appropriate Neurologic:  CN 2-12 grossly intact, moves all extremities in coordinated fashion  Data Reviewed: I have reviewed the patient's lab results since admission.   Pertinent labs for today include:  K+ 3.4 WBC 7.9 Hgb 8.3, stable    Family Communication: None present this AM; I spoke with her daughter at the bedside this PM  Disposition: Status is: Inpatient Remains inpatient appropriate because: ongoing evaluation and treatment     Time spent: 50 minutes  Unresulted Labs (From admission, onward)     Start     Ordered   09/07/22 0500  CBC with Differential/Platelet  Tomorrow morning,   R       Question:  Specimen collection method  Answer:  Lab=Lab collect   09/06/22 0835   09/07/22 0500  Basic metabolic panel  Tomorrow morning,   R       Question:  Specimen collection method  Answer:  Lab=Lab collect   09/06/22 0835   09/06/22 1755  CBC  Once,   R       Question:  Specimen collection method  Answer:  Lab=Lab collect   09/06/22 1754   09/03/22 2005  Protein electrophoresis, serum  Once,   R        09/03/22 2004             Author: Jonah Blue, MD 09/06/2022 5:59 PM  For on call review [email or web address].

## 2022-09-06 NOTE — Progress Notes (Signed)
Daily Progress Note   Patient Name: Jocelyn Bauer       Date: 09/06/2022 DOB: 02-24-1928  Age: 89 y.o. MRN#: [id number] Attending Physician: Jonah Blue, MD Primary Care Physician: Rodrigo Ran, MD Admit Date: 09/03/2022  Reason for Consultation/Follow-up: Establishing goals of care  Patient Profile/HPI:   89 y.o. female  with past medical history of chronic osteomyelitis R lower extremity, rheumatoid arthritis, anxiety, HTN, HLD admitted on 09/03/2022 with aspiration pneumonia, anemia with hemoccult positive. Palliative medicine consulted for  goals of care.   Subjective:  I have reviewed medical records including Care Everywhere, progress notes from this and prior admissions, labs and imaging, discussed with RN.  On evaluation patient is sleeping. Per her family she was awake and interactive last night.   I met in conference room with patient's children.  I introduced Palliative Medicine as specialized medical care for people living with serious illness. It focuses on providing relief from the symptoms and stress of a serious illness. The goal is to improve quality of life for both the patient and the family.  As far as functional and nutritional status prior to admission she was wheelchair bound and required assistance with ADL's. Mainly snacks throughout the day, doesn't eat large meals. Maintaining weight.    We discussed patient's current illness and what it means in the larger context of patient's on-going co-morbidities.  Natural disease trajectory and expectations at EOL were discussed.  I attempted to elicit values and goals of care important to the patient.    The difference between aggressive medical intervention and comfort care was considered in light of the patient's  goals of care.   Advance directives, concepts specific to code status, artificial feeding and hydration, and rehospitalization were considered and discussed.  Discussed with patient/family the importance of continued conversation with family and the medical providers regarding overall plan of care and treatment options, ensuring decisions are within the context of the patient's values and GOCs.     I completed a MOST form today. The patient and family outlined their wishes for the following treatment decisions:  Cardiopulmonary Resuscitation: Do Not Attempt Resuscitation (DNR/No CPR)  Medical Interventions: Limited Additional Interventions: Use medical treatment, IV fluids and cardiac monitoring as indicated, DO NOT USE intubation or mechanical ventilation. May consider use of less invasive airway support  such as BiPAP or CPAP. Also provide comfort measures. Transfer to the hospital if indicated. Avoid intensive care.   Antibiotics: Antibiotics if indicated  IV Fluids: IV fluids if indicated  Feeding Tube: No feeding tube     Hospice and Palliative Care services outpatient were explained and offered. Hard choices book was provided.  Questions and concerns were addressed. The family was encouraged to call with questions or concerns.   ROS   Physical Exam          Vital Signs: BP 130/60 (BP Location: Right Arm)   Pulse 83   Temp 99.3 F (37.4 C) (Oral)   Resp 17   Ht 5\' 2"  (1.575 m)   Wt 60.8 kg   SpO2 97%   BMI 24.52 kg/m  SpO2: SpO2: 97 % O2 Device: O2 Device: Room Air O2 Flow Rate: O2 Flow Rate (L/min): 3 L/min  Intake/output summary:  Intake/Output Summary (Last 24 hours) at 09/06/2022 1126 Last data filed at 09/06/2022 0900 Gross per 24 hour  Intake 183 ml  Output 700 ml  Net -517 ml   LBM: Last BM Date : 09/06/22 Baseline Weight: Weight: 60.8 kg Most recent weight: Weight: 60.8 kg       Palliative Assessment/Data:      Patient Active Problem List    Diagnosis Date Noted   Ambulatory dysfunction 09/04/2022   Multifocal pneumonia 09/03/2022   Delirium 09/03/2022   Occult blood in stools 09/03/2022   Lung nodule 09/03/2022   Cellulitis of right lower extremity 05/26/2022   Symptomatic anemia 05/26/2022   Shortness of breath 07/26/2021   Pneumonia due to infectious organism 06/28/2021   Palliative care encounter 06/25/2021   RA (rheumatoid arthritis) (HCC) 03/17/2021   DNR (do not resuscitate) 03/17/2021   Infected hardware in right leg (HCC)    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)    Contusion of left elbow 06/14/2020   Closed trimalleolar fracture of right ankle    Chronic, continuous use of opioids 07/28/2019   Right elbow pain 07/14/2015   Presence of right artificial elbow joint 07/13/2015   Hypertension    Hypothyroidism    Hyperlipidemia    GERD (gastroesophageal reflux disease)    Anxiety    Osteoporosis    Chronic neck and back pain    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    Palliative Care Assessment & Plan    Assessment/Recommendations/Plan  Continue current plan of care- if patient decompensates then family is open to hospice/comfort measures- currently their GOC is for patient to D/C to [facility] and continue life prolonging care as long as she is able to be awake and interact with family and her caregivers at [facility]. Refer for outpatient Palliative to see at SNF MOST form completed and placed on chart- see selections above   Code Status: DNR  Prognosis:  Unable to determine  Discharge Planning: Skilled Nursing Facility for rehab with Palliative care service follow-up  Care plan was discussed with family  Thank you for allowing the Palliative Medicine Team to assist in the care of this patient.  Total time:  90 minutes      Greater than 50%  of this time was spent counseling and coordinating care related to the above assessment and plan.  Ocie Bob,  AGNP-C Palliative Medicine   Please contact Palliative Medicine Team phone at [phone] for questions and concerns.

## 2022-09-06 NOTE — Plan of Care (Signed)
  Problem: Clinical Measurements: Goal: Ability to maintain clinical measurements within normal limits will improve Outcome: Progressing Goal: Cardiovascular complication will be avoided Outcome: Progressing   Problem: Nutrition: Goal: Adequate nutrition will be maintained Outcome: Progressing   Problem: Elimination: Goal: Will not experience complications related to bowel motility Outcome: Progressing

## 2022-09-06 NOTE — Progress Notes (Signed)
Occupational Therapy Treatment Patient Details Name: Jocelyn Bauer MRN: [id number] DOB: July 02, 1928 Today's Date: 09/06/2022   History of present illness Pt is a 89 yr old female admitted to the hospital with altered mental status and weakness. Found to have PNA, anemia, and GI bleed. PMH: panic attacks, RA, bilateral ankle fractures, chronic pain, HTN, OA, OP   OT comments  The pt presented with much improved alertness and command follow today, as compared to the prior OT session. She was seen for functional strengthening and progression of out of bed activity, as such is warranted to facilitate improved ADL performance. She required assist for bed mobility, as well as to stand then step-pivot to the the bedside chair using a RW. She reported increased "butt" pain in standing, subsequently limiting her overall standing tolerance. Once seated in the chair, she performed simple grooming with supervision. Continue OT plan of care. Patient will benefit from continued inpatient follow up therapy, <3 hours/day.       If plan is discharge home, recommend the following:  A lot of help with walking and/or transfers;A lot of help with bathing/dressing/bathroom;Direct supervision/assist for financial management;Assistance with cooking/housework;Supervision due to cognitive status;Direct supervision/assist for medications management   Equipment Recommendations  Other (comment) (defer to next level of care)    Recommendations for Other Services      Precautions / Restrictions Precautions Precautions: Fall Restrictions Weight Bearing Restrictions: No       Mobility Bed Mobility Overal bed mobility: Needs Assistance Bed Mobility: Supine to Sit     Supine to sit: Mod assist, HOB elevated, Used rails     General bed mobility comments: required increased time and effort, as well as assist for BLE off the bed    Transfers Overall transfer level: Needs assistance Equipment used: Rolling walker  (2 wheels) Transfers: Sit to/from Stand, Bed to chair/wheelchair/BSC Sit to Stand: Mod assist     Step pivot transfers: Mod assist     General transfer comment: She required cues for trunk and neck extension in standing, as well as cues for walker placement when transferring to chair         ADL either performed or assessed with clinical judgement   ADL Overall ADL's : Needs assistance/impaired Eating/Feeding: Minimal assistance;Sitting Eating/Feeding Details (indicate cue type and reason): at chair level Grooming: Minimal assistance;Sitting Grooming Details (indicate cue type and reason): She performed face washing at chair level Upper Body Bathing: Moderate assistance Upper Body Bathing Details (indicate cue type and reason): based on clinical judgement Lower Body Bathing: Sitting/lateral leans;Maximal assistance Lower Body Bathing Details (indicate cue type and reason): based on clinical judgement Upper Body Dressing : Moderate assistance;Sitting Upper Body Dressing Details (indicate cue type and reason): simulated Lower Body Dressing: Total assistance Lower Body Dressing Details (indicate cue type and reason): based on clinical judgement                     Cognition Arousal: Alert Behavior During Therapy: WFL for tasks assessed/performed        General Comments: appropriately alert today, able to follow 1 step commands, cooperative                   Pertinent Vitals/ Pain       Pain Assessment Pain Assessment: Faces Pain Score: 5  Pain Location: "butt" pain in standing Pain Intervention(s): Limited activity within patient's tolerance, Monitored during session, Repositioned         Frequency  Min  1X/week        Progress Toward Goals  OT Goals(current goals can now be found in the care plan section)  Progress towards OT goals: Progressing toward goals  Acute Rehab OT Goals OT Goal Formulation: With patient Time For Goal Achievement:  09/18/22 Potential to Achieve Goals: Good  Plan         AM-PAC OT "6 Clicks" Daily Activity     Outcome Measure   Help from another person eating meals?: A Little Help from another person taking care of personal grooming?: A Little Help from another person toileting, which includes using toliet, bedpan, or urinal?: A Lot Help from another person bathing (including washing, rinsing, drying)?: A Lot Help from another person to put on and taking off regular upper body clothing?: A Lot Help from another person to put on and taking off regular lower body clothing?: Total 6 Click Score: 13    End of Session Equipment Utilized During Treatment: Gait belt;Rolling walker (2 wheels);Oxygen  OT Visit Diagnosis: Muscle weakness (generalized) (M62.81);Unsteadiness on feet (R26.81)   Activity Tolerance Patient limited by fatigue   Patient Left in chair;with call bell/phone within reach;with chair alarm set   Nurse Communication Mobility status        Time: 6433-2951 OT Time Calculation (min): 20 min  Charges: OT General Charges $OT Visit: 1 Visit OT Treatments $Therapeutic Activity: 8-22 mins      Reuben Likes, OTR/L 09/06/2022, 12:16 PM

## 2022-09-06 NOTE — Plan of Care (Signed)
  Problem: Clinical Measurements: Goal: Will remain free from infection Outcome: Progressing Goal: Diagnostic test results will improve Outcome: Progressing   Problem: Activity: Goal: Risk for activity intolerance will decrease Outcome: Progressing   Problem: Coping: Goal: Level of anxiety will decrease Outcome: Progressing   Problem: Safety: Goal: Ability to remain free from injury will improve Outcome: Progressing

## 2022-09-06 NOTE — TOC Progression Note (Signed)
Transition of Care [facility]) - Progression Note    Patient Details  Name: ZLATY EMGE MRN: [id number] Date of Birth: Jun 01, 1928  [facility] CM/SW Contact  Howell Rucks, RN Phone Number: 09/06/2022, 11:02 AM  Clinical Narrative:  Call to pt's dtr Dondra Spry) to review SNF bed offer list,  accepted bed offer at [facility], updated in SNF HUB. TOC will continue to follow.       Expected Discharge Plan: Skilled Nursing Facility Barriers to Discharge: Continued Medical Work up  Expected Discharge Plan and Services       Living arrangements for the past 2 months: Single Family Home                                       Social Determinants of Health (SDOH) Interventions SDOH Screenings   Food Insecurity: No Food Insecurity (09/03/2022)  Housing: Low Risk  (09/03/2022)  Transportation Needs: No Transportation Needs (09/03/2022)  Utilities: Not At Risk (09/03/2022)  Depression (PHQ2-9): Low Risk  (02/05/2021)  Tobacco Use: Medium Risk (09/03/2022)    Readmission Risk Interventions    09/04/2022   11:50 AM  Readmission Risk Prevention Plan  Transportation Screening Complete  PCP or Specialist Appt within 3-5 Days Complete  HRI or Home Care Consult Complete  Social Work Consult for Recovery Care Planning/Counseling Complete  Palliative Care Screening Not Applicable  Medication Review Ocean[employer]) Complete

## 2022-09-07 ENCOUNTER — Inpatient Hospital Stay (HOSPITAL_COMMUNITY): Payer: Medicare Other

## 2022-09-07 DIAGNOSIS — D649 Anemia, unspecified: Secondary | ICD-10-CM | POA: Diagnosis not present

## 2022-09-07 LAB — CBC WITH DIFFERENTIAL/PLATELET
Abs Immature Granulocytes: 0.53 10*3/uL — ABNORMAL HIGH (ref 0.00–0.07)
Basophils Absolute: 0.1 10*3/uL (ref 0.0–0.1)
Basophils Relative: 1 %
Eosinophils Absolute: 0.6 10*3/uL — ABNORMAL HIGH (ref 0.0–0.5)
Eosinophils Relative: 6 %
HCT: 31.9 % — ABNORMAL LOW (ref 36.0–46.0)
Hemoglobin: 9.6 g/dL — ABNORMAL LOW (ref 12.0–15.0)
Immature Granulocytes: 5 %
Lymphocytes Relative: 14 %
Lymphs Abs: 1.5 10*3/uL (ref 0.7–4.0)
MCH: 27.7 pg (ref 26.0–34.0)
MCHC: 30.1 g/dL (ref 30.0–36.0)
MCV: 91.9 fL (ref 80.0–100.0)
Monocytes Absolute: 1.4 10*3/uL — ABNORMAL HIGH (ref 0.1–1.0)
Monocytes Relative: 14 %
Neutro Abs: 6.4 10*3/uL (ref 1.7–7.7)
Neutrophils Relative %: 60 %
Platelets: 650 10*3/uL — ABNORMAL HIGH (ref 150–400)
RBC: 3.47 MIL/uL — ABNORMAL LOW (ref 3.87–5.11)
RDW: 19 % — ABNORMAL HIGH (ref 11.5–15.5)
WBC: 10.4 10*3/uL (ref 4.0–10.5)
nRBC: 0 % (ref 0.0–0.2)

## 2022-09-07 LAB — BLOOD GAS, VENOUS
Acid-Base Excess: 2.2 mmol/L — ABNORMAL HIGH (ref 0.0–2.0)
Bicarbonate: 28.3 mmol/L — ABNORMAL HIGH (ref 20.0–28.0)
O2 Saturation: 46.8 %
Patient temperature: 36.1
pCO2, Ven: 47 mmHg (ref 44–60)
pH, Ven: 7.38 (ref 7.25–7.43)
pO2, Ven: <31 mmHg — CL (ref 32–45)

## 2022-09-07 LAB — BASIC METABOLIC PANEL
Anion gap: 11 (ref 5–15)
BUN: 8 mg/dL (ref 8–23)
CO2: 24 mmol/L (ref 22–32)
Calcium: 8.5 mg/dL — ABNORMAL LOW (ref 8.9–10.3)
Chloride: 102 mmol/L (ref 98–111)
Creatinine, Ser: 0.66 mg/dL (ref 0.44–1.00)
GFR, Estimated: >60 mL/min (ref >60)
Glucose, Bld: 117 mg/dL — ABNORMAL HIGH (ref 70–99)
Potassium: 3.2 mmol/L — ABNORMAL LOW (ref 3.5–5.1)
Sodium: 137 mmol/L (ref 135–145)

## 2022-09-07 LAB — D-DIMER, QUANTITATIVE: D-Dimer, Quant: 6.23 ug{FEU}/mL — ABNORMAL HIGH (ref 0.00–0.50)

## 2022-09-07 LAB — BRAIN NATRIURETIC PEPTIDE: B Natriuretic Peptide: 246.8 pg/mL — ABNORMAL HIGH (ref 0.0–100.0)

## 2022-09-07 MED ORDER — POTASSIUM CHLORIDE 20 MEQ PO PACK
40.0000 meq | PACK | Freq: Once | ORAL | Status: AC
  Administered 2022-09-07: 40 meq via ORAL
  Filled 2022-09-07: qty 2

## 2022-09-07 MED ORDER — FUROSEMIDE 10 MG/ML IJ SOLN
20.0000 mg | Freq: Once | INTRAMUSCULAR | Status: AC
  Administered 2022-09-07: 20 mg via INTRAVENOUS
  Filled 2022-09-07: qty 2

## 2022-09-07 NOTE — Progress Notes (Signed)
    Patient Name: Jocelyn Bauer           DOB: 1928/02/24  MRN: [id number]      Admission Date: 09/03/2022  Attending Provider: Jonah Blue, MD  Primary Diagnosis: Symptomatic anemia   Level of care: Progressive    CROSS COVER NOTE   Date of Service   09/07/2022   [name], 89 y.o. female, was admitted on 09/03/2022 for Symptomatic anemia.    HPI/Events of Note   Dyspnea and hypoxic-  SpO2 60s on 4 L nasal cannula Respiratory change occurred after fit of cough triggered inc WOB.  She is now on a nonrebreather at 15 L with optimal O2 saturation.  All other vital stable.  Alert to self, place, and year. Multiple attempts were made to transition patient to Venturi or HFNC, however patient does not tolerate this. Her O2 drops to 70% and she becomes anxious, restless. Lung sounds wheezy, scattered crackles.  Could be silently aspirating?  Patient to receive neb treatment.  Will also place order for chest x-ray. Continue antibiotics for multifocal PNA. No blood loss reported tonight.  Last hemoglobin 8.0.   Interventions/ Plan   Chest x-ray-scattered airspace opacities, bilaterally.  Most pronounced in the right upper lobe, edema versus multifocal PNA. IV Lasix, 20 mg VBG-SpO2 < 31 BNP-246.8 D-dimer- pending Keep n.p.o.-due to respiratory change and possibly silent aspiration Hemoglobin stable --> 8.0--> 9.6 Nursing staff to continue titrating O2 down if possible.        Anthoney Harada, DNP, ACNPC- AG [facility]

## 2022-09-07 NOTE — Significant Event (Signed)
Rapid response Note  Called to see patient for shortness of breath with increased oxygen requirements to maintain saturations above 92%. Bedside RN states this occurred after a significant coughing episode. Bedside RN denies that patient had any obvious aspiration event with taking oral meds tonight. Patient was not taking anything orally at time of coughing episode.  Arrived to bedside and patient was alert/oriented x 4, but having difficulty speaking due to fatigue and shortness of breath. Patient wearing NRB at 15 L/min with respiratory therapist and nursing staff at bedside. Lung sounds with fine crackles and slight wheeze which is worse than initial shift assessment per bedside RN. Bedside RN communicating with on call provider, A. Virgel Manifold, NP who has ordered chest xray and Lasix 20mg  IV x 1 dose. Multiple attempts to switch oxygen delivery from NRB mask, but patient became anxious and unable to maintain saturations.VS: BP 152/69, HR 105, RR 28-33, axillary temp 26F. Lasix and Albuterol given. Patient appears more comfortable on NRB, will continue for now. Bedside RN to inform provider and RR RN if any worsening condition.    09/07/22 0244  Neurological  Level of Consciousness Alert  Orientation Level Oriented X4  Neuro Symptoms Fatigue  Respiratory  Cough Non-productive;Strong  Respiratory Pattern Labored;Symmetrical  Chest Assessment Chest expansion symmetrical  R Upper  Breath Sounds Diminished  L Upper Breath Sounds Diminished  R Lower Breath Sounds Fine crackles;Expiratory wheezes;Diminished  L Lower Breath Sounds Fine crackles;Diminished  Cardiac  Pulse Regular  Heart Sounds S1, S2  Urine Characteristics  Urine Color Yellow/straw  Urine Appearance Clear

## 2022-09-07 NOTE — Plan of Care (Signed)
  Problem: Coping: Goal: Level of anxiety will decrease Outcome: Progressing   Problem: Elimination: Goal: Will not experience complications related to urinary retention Outcome: Progressing   Problem: Pain Managment: Goal: General experience of comfort will improve Outcome: Progressing   Problem: Clinical Measurements: Goal: Respiratory complications will improve Outcome: Not Progressing

## 2022-09-07 NOTE — Plan of Care (Signed)
  Problem: Education: Goal: Knowledge of General Education information will improve Description: Including pain rating scale, medication(s)/side effects and non-pharmacologic comfort measures Outcome: Progressing   Problem: Clinical Measurements: Goal: Will remain free from infection Outcome: Progressing Goal: Diagnostic test results will improve Outcome: Progressing   Problem: Elimination: Goal: Will not experience complications related to bowel motility Outcome: Progressing   

## 2022-09-07 NOTE — Evaluation (Signed)
Clinical/Bedside Swallow Evaluation Patient Details  Name: TODD GAMBEL MRN: [id number] Date of Birth: [birth year]  Today's Date: 09/07/2022 Time: SLP Start Time (ACUTE ONLY): 1338 SLP Stop Time (ACUTE ONLY): 1354 SLP Time Calculation (min) (ACUTE ONLY): 16 min  Past Medical History:  Past Medical History:  Diagnosis Date   Anxiety    HX OF PANIC ATTACKS   Arthritis    RA   Bilateral ankle fractures 07/26/2019   Chronic neck and back pain    Chronic pain    Chronic, continuous use of opioids 07/28/2019   Elbow locking    due to RA- elbow with limited extension   GERD (gastroesophageal reflux disease)    Headache    Hyperlipidemia    Hypertension    Hypothyroidism    Osteoarthritis    Osteoporosis    Pneumonia    Shortness of breath    occas   UTI (urinary tract infection)    Past Surgical History:  Past Surgical History:  Procedure Laterality Date   CATARACT EXTRACTION Bilateral    COLONOSCOPY     ELBOW SURGERY Right    ELBOW SURGERY Left    ESOPHAGOGASTRODUODENOSCOPY     HARDWARE REMOVAL Right 07/13/2015   Procedure:  WITH HARDWARE REMOVAL REPAIR AS NECESSARY;  Surgeon: Dominica Severin, MD;  Location: [facility] OR;  Service: Orthopedics;  Laterality: Right;   HARDWARE REMOVAL Right 01/22/2021   Procedure: HARDWARE REMOVAL;  Surgeon: Myrene Galas, MD;  Location: [facility] OR;  Service: Orthopedics;  Laterality: Right;   JOINT REPLACEMENT  1998   left hip replacement and bilateral elbow replacements  about 15 yrs ago   ORIF ANKLE FRACTURE Bilateral 07/27/2019   Procedure: OPEN REDUCTION INTERNAL FIXATION (ORIF) ANKLE FRACTURE;  Surgeon: Myrene Galas, MD;  Location: [facility] OR;  Service: Orthopedics;  Laterality: Bilateral;   thyroidectomy     TOTAL ELBOW ARTHROPLASTY Right 07/13/2015   Procedure: RIGHT ELBOW REVISION TOTAL ELBOW ARTHROPLASTY ;  Surgeon: Dominica Severin, MD;  Location: [facility] OR;  Service: Orthopedics;  Laterality: Right;   TOTAL HIP ARTHROPLASTY     TOTAL HIP REVISION   03/11/2011   Procedure: TOTAL HIP REVISION;  Surgeon: [name], MD;  Location: [facility] ORS;  Service: Orthopedics;  Laterality: Left;   TRICEPS TENDON REPAIR Right 07/13/2015   Procedure:  TRICEP RECONSTRUCTION REPAIR WITH ALLOGRAFT, ULNAR NERVE NEUROLYSIS;  Surgeon: Dominica Severin, MD;  Location: [facility] OR;  Service: Orthopedics;  Laterality: Right;   HPI:  89 yo female adm to [facility] with AMS, weakness.  Patient has become progressively weak over the last 7 days but especially over the last 48 hours prompting the family to bring the patient to the hospital.  PMH: panic attacks, RA, bilateral ankle fractures, chronic pain, HTN, OA, OP, some independence PTA. CXR  Bilateral airspace disease greatest in the right upper lobe, which could reflect multifocal bronchopneumonia or asymmetric edema.  MBS 06/2021 functional oropharyngeal swallow, prom cp and cervical osteophyte did not impair barium flow, no asp/penetration, minimal esoph retention. BSE completed 8/29 recommending regular (family ordering soft foods), thin liquids and signed off. Pt had rapid response when she woke up at 0400 this morning with a coughing spell but was not eating or drinking. A BSE was re-ordered.    Assessment / Plan / Recommendation  Clinical Impression  Pt seen for swallow assessment after rapid response and pt's daughter present. Per RN and daughter at 0400 this am pt had a coughing episode and was not eating or drinking at  that time and repeat swallow evaluation ordered. Daughter reports pt has dentures but only sometimes wears when eating. After initial assessment regular texture ordered and family orders softer foods for her. Assessment similar to that on 8/29 with prolonged mastication, minimal lingual residue needing liquid wash to clear. She consumed applesauce with timely swallows and thin water via straw without s/s aspiration over multiple sips. It is recommended she start back on regular texture, thin liquids, pills whole in puree  and liquid wash or applesauce to assist in clearing oral cavity if needed. Pt may have had episode of reflux to cause coughing spell (?). No further ST needed at this time. SLP Visit Diagnosis: Dysphagia, oral phase (R13.11)    Aspiration Risk  Mild aspiration risk    Diet Recommendation Regular;Thin liquid    Liquid Administration via: Straw;Cup Medication Administration: Whole meds with puree Supervision: Patient able to self feed Compensations: Slow rate;Small sips/bites Postural Changes: Seated upright at 90 degrees    Other  Recommendations Oral Care Recommendations: Oral care BID    Recommendations for follow up therapy are one component of a multi-disciplinary discharge planning process, led by the attending physician.  Recommendations may be updated based on patient status, additional functional criteria and insurance authorization.  Follow up Recommendations No SLP follow up      Assistance Recommended at Discharge    Functional Status Assessment Patient has not had a recent decline in their functional status  Frequency and Duration            Prognosis        Swallow Study   General Date of Onset: 09/05/22 HPI: 89 yo female adm to [facility] with AMS, weakness.  Patient has become progressively weak over the last 7 days but especially over the last 48 hours prompting the family to bring the patient to the hospital.  PMH: panic attacks, RA, bilateral ankle fractures, chronic pain, HTN, OA, OP, some independence PTA. CXR  Bilateral airspace disease greatest in the right upper lobe, which could reflect multifocal bronchopneumonia or asymmetric edema.  MBS 06/2021 functional oropharyngeal swallow, prom cp and cervical osteophyte did not impair barium flow, no asp/penetration, minimal esoph retention. BSE completed 8/29 recommending regular (family ordering soft foods), thin liquids and signed off. Pt had rapid response when she woke up at 0400 this morning with a coughing spell but  was not eating or drinking. A BSE was re-ordered. Type of Study: Bedside Swallow Evaluation Previous Swallow Assessment:  (see HPI) Diet Prior to this Study: Regular;Thin liquids (Level 0) (family orders soft foods for pt) Temperature Spikes Noted: No Respiratory Status: Nasal cannula History of Recent Intubation: No Behavior/Cognition: Other (Comment) (sleepy but able to adequately wake for swallow eval) Oral Cavity Assessment: Within Functional Limits Oral Care Completed by SLP: No Oral Cavity - Dentition: Edentulous (has dentures but doesn't always wear when eating per dtr) Vision: Functional for self-feeding Self-Feeding Abilities: Able to feed self Patient Positioning: Upright in bed Baseline Vocal Quality: Normal Volitional Cough: Cognitively unable to elicit Volitional Swallow: Unable to elicit    Oral/Motor/Sensory Function Overall Oral Motor/Sensory Function: Within functional limits   Ice Chips Ice chips: Not tested   Thin Liquid Thin Liquid: Within functional limits Presentation: Cup;Straw    Nectar Thick Nectar Thick Liquid: Not tested   Honey Thick Honey Thick Liquid: Not tested   Puree Puree: Within functional limits   Solid     Solid: Impaired Presentation: Self Fed Oral Phase Impairments: Reduced lingual movement/coordination  Oral Phase Functional Implications: Prolonged oral transit;Oral residue      Royce Macadamia 09/07/2022,4:15 PM

## 2022-09-07 NOTE — Progress Notes (Signed)
Progress Note   Patient: Jocelyn Bauer ZOX:[id number] DOB: 1928/12/10 DOA: 09/03/2022     4 DOS: the patient was seen and examined on 09/07/2022   Brief hospital course: 89yo with h/o chronic deconditioning/ambulatory dysfunction in wheelchair presenting with AMS.  She developed dysuria a week ago and was treated with Bactrim and developed confusion, hallucinations, and generalized weakness.  She was noted to be 85% on room air, now stable on 2L [place] O2.  She was diagnosed with aspiration PNA and started on Ceftriaxone -> Unasyn and Azithromycin.  Hgb dropping, heme positive, very lethargic so concern for GI bleeding and given transfusion with dramatic improvement.  Now acknowledges black stools for some time.  Takes NSAIDs.  Overnight 8/30-31 had acute respiratory decompensation requiring NRB O2.  She is improving somewhat from that standpoint.  She is continuing to have large black stools.  Assessment and Plan: * Symptomatic anemia Patient with Hgb 9.6 in May, on admission 7.5 -> 7.1 and heme positive In talking with her daughter, primary concerns were AMS, DOE, and fatigue - c/w symptomatic anemia Concern for GI bleed, but given her age and frailty, further evaluation may not be reasonable (see below) Family amenable to transfusion Transfused 1 unit PRBC  Hgb improved to 8.3 and patient dramatically improved clinically transiently and then returned to prior depressed LOC Hgb stable despite clinical deterioration Continuing to have large black BMs Recheck CBC now and transfuse if needed for Hgb <7  Ambulatory dysfunction Wheelchair dependent at baseline but currently having more difficulty transferring PT/OT consults Appears likely to need SNF Family requests [facility], assuming she shows some improvement towards dc  Lung nodule Increased size. Will need outpatient work up in this regard.  Occult blood in stools Patient presenting with likely upper GI bleeding There was no report of  frank bleeding or melena from patient or family on presentation but patient now acknowledges that her stools have been very black She is having additional tarry stools Continue Protonix IV daily If ongoing bleeding, consider risks/benefits of EGD - however at this time family would not consider this option given her extremely high risk of poor outcome so will hold on GI consult Needs avoidance of all NSAIDs  Delirium Reports chronic episodes of patient having confusion with various infections.   Currently decompensated, possibly from infection and/or symptomatic anemia CT head is negative Hold narcotics as much as possible Taper ativan to 0.25 mg bid. I would favor ceasing this medication over the long term. Add delirium precautions Significantly improved with halt in chronic pain medications as well as post-transfusion on 8/29 Recurrent hypersomnolence since  Multifocal pneumonia She is on 2L home O2 but has had worsening SOB for several weeks that worsened after she vomited recently Negative COVID and RVP testing Some concern for aspiration, will request SLP evaluation Given ceftriaxone and azithromycin in the ER, changed to Unasyn and azithromycin.   She is on methotrexate and prednisone and so is immunocompromised as well.  DNR (do not resuscitate) Patient is DNR Various family members have discussed the idea of transition to comfort care but palliative care meeting today was held and I spoke with her daughter who says she absolutely wants to treat the treatable I also spoke with her other daughter, who appears to be recognizing the severity of her mother's condition  RA (rheumatoid arthritis) (HCC) Hold methotrexate, continue with prednisone 5 mg daily.  Patient should get stress dose steroids in case of any vital instability.  Chronic neck and  back pain Resume home dose of Fentanyl patch (this was decreased on admission but is a long-acting medication and so this change was  unlikely to affect significant change) Separate oxy IR dosing from Tylenol - continue both medications but give Oxy only prn if Tylenol and fentanyl aren't effective I have reviewed this patient in the [place] Controlled Substances Reporting System.  She is receiving medications from multiple providers and IS at particularly high risk of opioid misuse, diversion, or overdose.       Consultants: Palliative care PT/OT SLP TOC team   Procedures: None   Antibiotics: Ceftriaxone 8/27 Unasyn 8/27- Azithromycin 8/27-31  30 Day Unplanned Readmission Risk Score    Flowsheet Row ED to Hosp-Admission (Current) from 09/03/2022 in [place] 4TH FLOOR PROGRESSIVE CARE AND UROLOGY  30 Day Unplanned Readmission Risk Score (%) 27.75 Filed at 09/07/2022 0800       This score is the patient's risk of an unplanned readmission within 30 days of being discharged (0 -100%). The score is based on dignosis, age, lab data, medications, orders, and past utilization.   Low:  0-14.9   Medium: 15-21.9   High: 22-29.9   Extreme: 30 and above           Subjective: She remains somewhat somnolent.  Her daughter who is present continues to insist that she wants everything possible done.  Overnight, she had a significant coughing event that did not appear to be associated with food; she was SOB afterwards.  O2 increased to 15L NRB.  CXR ordered and showed scattered B airspace opacities, edema vs. Multifocal PNA.  She was given a dose of Lasix as well as an Albuterol neb with improvement.  She was made NPO.  D-dimer is quite high - but she has acute GI bleeding and so is not a candidate for [facility] regardless.  She also has a contrast dye allergy.  Will order VQ scan but stat would not be beneficial.  I spoke with her other daughter, Dondra Spry.  She is concerned that she is not going to rally from this.  She can't see putting her through a lot.     Objective: Vitals:   09/07/22 0421 09/07/22 1334  BP: 136/64 138/66   Pulse: (!) 110 80  Resp: 20 14  Temp: 98.3 F (36.8 C) 97.6 F (36.4 C)  SpO2: 93% 99%    Intake/Output Summary (Last 24 hours) at 09/07/2022 1757 Last data filed at 09/07/2022 1538 Gross per 24 hour  Intake 890 ml  Output 2650 ml  Net -1760 ml   Filed Weights   09/03/22 1357  Weight: 60.8 kg    Exam:  General:  Hypersomnolent today but arouses to voice and touch and answers limited questions Eyes:  normal lids, iris ENT:  grossly normal hearing, lips & tongue, mmm, upper dentures only Neck:  no LAD, masses or thyromegaly Cardiovascular:  RRR, no m/r/g. No LE edema.  Respiratory:   CTA bilaterally with no wheezes/rales/rhonchi.  Normal respiratory effort. Abdomen:  soft, NT, ND Skin:  no rash or induration seen on limited exam Musculoskeletal:  grossly normal tone BUE/BLE, good ROM, no bony abnormality Psychiatric:  somnolent mood and affect, speech sparse but appropriate Neurologic:  CN 2-12 grossly intact, moves all extremities in coordinated fashion  Data Reviewed: I have reviewed the patient's lab results since admission.  Pertinent labs for today include:   K+ 3.2 BNP 246.8 WBC 10.4 Hgb 9.6 Platelets 650 D-dimer 6.23    Family Communication:  I spoke with one daughter at bedside and another by telephone  Disposition: Status is: Inpatient Remains inpatient appropriate because: still very ill     Time spent: 50 minutes  Unresulted Labs (From admission, onward)     Start     Ordered   09/08/22 0500  CBC with Differential/Platelet  Tomorrow morning,   R       Question:  Specimen collection method  Answer:  Lab=Lab collect   09/07/22 1752   09/08/22 0500  Basic metabolic panel  Tomorrow morning,   R       Question:  Specimen collection method  Answer:  Lab=Lab collect   09/07/22 1752             Author: Jonah Blue, MD 09/07/2022 5:57 PM  For on call review [email or web address].

## 2022-09-08 DIAGNOSIS — D649 Anemia, unspecified: Secondary | ICD-10-CM | POA: Diagnosis not present

## 2022-09-08 LAB — CBC WITH DIFFERENTIAL/PLATELET
Abs Immature Granulocytes: 0.54 10*3/uL — ABNORMAL HIGH (ref 0.00–0.07)
Basophils Absolute: 0.1 10*3/uL (ref 0.0–0.1)
Basophils Relative: 1 %
Eosinophils Absolute: 0.5 10*3/uL (ref 0.0–0.5)
Eosinophils Relative: 6 %
HCT: 28.1 % — ABNORMAL LOW (ref 36.0–46.0)
Hemoglobin: 8.3 g/dL — ABNORMAL LOW (ref 12.0–15.0)
Immature Granulocytes: 6 %
Lymphocytes Relative: 13 %
Lymphs Abs: 1.3 10*3/uL (ref 0.7–4.0)
MCH: 27.4 pg (ref 26.0–34.0)
MCHC: 29.5 g/dL — ABNORMAL LOW (ref 30.0–36.0)
MCV: 92.7 fL (ref 80.0–100.0)
Monocytes Absolute: 1.4 10*3/uL — ABNORMAL HIGH (ref 0.1–1.0)
Monocytes Relative: 15 %
Neutro Abs: 5.7 10*3/uL (ref 1.7–7.7)
Neutrophils Relative %: 59 %
Platelets: 556 10*3/uL — ABNORMAL HIGH (ref 150–400)
RBC: 3.03 MIL/uL — ABNORMAL LOW (ref 3.87–5.11)
RDW: 19.2 % — ABNORMAL HIGH (ref 11.5–15.5)
WBC: 9.6 10*3/uL (ref 4.0–10.5)
nRBC: 0 % (ref 0.0–0.2)

## 2022-09-08 LAB — BASIC METABOLIC PANEL
Anion gap: 10 (ref 5–15)
BUN: 10 mg/dL (ref 8–23)
CO2: 24 mmol/L (ref 22–32)
Calcium: 8.1 mg/dL — ABNORMAL LOW (ref 8.9–10.3)
Chloride: 105 mmol/L (ref 98–111)
Creatinine, Ser: 0.74 mg/dL (ref 0.44–1.00)
GFR, Estimated: >60 mL/min (ref >60)
Glucose, Bld: 124 mg/dL — ABNORMAL HIGH (ref 70–99)
Potassium: 3.4 mmol/L — ABNORMAL LOW (ref 3.5–5.1)
Sodium: 139 mmol/L (ref 135–145)

## 2022-09-08 MED ORDER — SODIUM CHLORIDE 0.9 % IV SOLN
3.0000 g | Freq: Four times a day (QID) | INTRAVENOUS | Status: DC
  Administered 2022-09-08 – 2022-09-09 (×4): 3 g via INTRAVENOUS
  Filled 2022-09-08 (×5): qty 8

## 2022-09-08 MED ORDER — GERHARDT'S BUTT CREAM
TOPICAL_CREAM | Freq: Four times a day (QID) | CUTANEOUS | Status: DC | PRN
  Filled 2022-09-08: qty 1

## 2022-09-08 NOTE — Progress Notes (Signed)
Progress Note   Patient: Jocelyn Bauer UJW:[id number] DOB: [birth year] DOA: 09/03/2022     5 DOS: the patient was seen and examined on 09/08/2022   Brief hospital course: 89yo with h/o chronic deconditioning/ambulatory dysfunction in wheelchair presenting with AMS.  She developed dysuria a week ago and was treated with Bactrim and developed confusion, hallucinations, and generalized weakness.  She was noted to be 85% on room air, now stable on 2L [place] O2.  She was diagnosed with aspiration PNA and started on Ceftriaxone -> Unasyn and Azithromycin.  Hgb dropping, heme positive, very lethargic so concern for GI bleeding and given transfusion with dramatic improvement.  Now acknowledges black stools for some time.  Takes NSAIDs.  Overnight 8/30-31 had acute respiratory decompensation requiring NRB O2.  She is improving somewhat from that standpoint.  She is continuing to have large black stools.  She has continued with intermittent lethargy and limited improvement.  Based on GOC conversation on 9/1, will not further escalate care but will continue abx for 7 total days and consider transition to comfort based on response to treatment.  Assessment and Plan:  Symptomatic anemia Patient with Hgb 9.6 in May, on admission 7.5 -> 7.1 and heme positive In talking with her daughter, primary concerns were AMS, DOE, and fatigue - c/w symptomatic anemia Concern for GI bleed, but given her age and frailty, further evaluation may not be reasonable (see below) Family amenable to transfusion, transfused 1 unit PRBC  Hgb improved to 8.3 and patient dramatically improved clinically transiently and then returned to prior depressed LOC without decline in Hgb Hgb stable despite clinical deterioration Continuing to have large black BMs Continue to follow clinically for now without plan for change in intervention Hold AM labs for tomorrow currently   Ambulatory dysfunction Wheelchair dependent at baseline but currently  having more difficulty transferring PT/OT consults Appears likely to need SNF Family requests [facility], assuming she shows some improvement towards dc   Lung nodule Increased size. May need outpatient work up in this regard.   Occult blood in stools Patient presenting with likely upper GI bleeding There was no report of frank bleeding or melena from patient or family on presentation but patient now acknowledges that her stools have been very black She is having additional tarry stools Continue Protonix IV daily Given her extremely high risk of poor outcome with EGD, will hold on GI consult based on discussion with family Needs avoidance of all NSAIDs   Delirium Reports chronic episodes of patient having confusion with various infections.   Currently decompensated, possibly from infection and/or symptomatic anemia CT head is negative Hold narcotics as much as possible Taper ativan to 0.25 mg bid. I would favor ceasing this medication over the long term. Add delirium precautions Significantly improved with halt in chronic pain medications as well as post-transfusion on 8/29 Recurrent hypersomnolence since   Multifocal pneumonia She is on 2L home O2 but has had worsening SOB for several weeks that worsened after she vomited recently Negative COVID and RVP testing Some concern for aspiration, SLP evaluation with only mild aspiration risk Given ceftriaxone and azithromycin in the ER, changed to Unasyn and azithromycin.   She is on methotrexate and prednisone and so is immunocompromised as well Increased respiratory distress overnight on 8/30, placed on NRB; has been weaned back to 4L [place] O2 Hold on VQ scan (ordered based on respiratory event on 8/31 AM and significantly elevated D-dimer)   DNR (do not resuscitate)/Goals of Care Patient  is DNR Various family members have discussed the idea of transition to comfort care but palliative care meeting today was held on 8/30 and daughter  said she absolutely wants to treat the treatable I also spoke with her other daughter, who appears to be recognizing the severity of her mother's condition Family meeting today for goals of care - after prolonged discussion, we will continue the antibiotics for 7 total days but will not escalate care for further evaluation   RA (rheumatoid arthritis) (HCC) Hold methotrexate, continue with prednisone 5 mg daily.  Patient should get stress dose steroids in case of any vital instability.   Chronic neck and back pain Home home Fentanyl patch Separate oxy IR dosing from Tylenol - continue both medications but give Oxy only prn if Tylenol and fentanyl aren't effective I have reviewed this patient in the [place] Controlled Substances Reporting System.  She is receiving medications from multiple providers and IS at particularly high risk of opioid misuse, diversion, or overdose.             Consultants: Palliative care PT/OT SLP TOC team   Procedures: None   Antibiotics: Ceftriaxone 8/27 Unasyn 8/27- Azithromycin 8/27-31       30 Day Unplanned Readmission Risk Score    Flowsheet Row ED to Hosp-Admission (Current) from 09/03/2022 in [place] 4TH FLOOR PROGRESSIVE CARE AND UROLOGY  30 Day Unplanned Readmission Risk Score (%) 21.54 Filed at 09/08/2022 0801       This score is the patient's risk of an unplanned readmission within 30 days of being discharged (0 -100%). The score is based on dignosis, age, lab data, medications, orders, and past utilization.   Low:  0-14.9   Medium: 15-21.9   High: 22-29.9   Extreme: 30 and above           Subjective: Patient is still quite somnolent.  Family reports small "rally" today and so not yet ready to transition to comfort only.   Objective: Vitals:   09/08/22 1340 09/08/22 1615  BP: (!) 130/50   Pulse:    Resp:    Temp: 99 F (37.2 C)   SpO2:  (!) 76%    Intake/Output Summary (Last 24 hours) at 09/08/2022 1618 Last data filed  at 09/08/2022 1500 Gross per 24 hour  Intake 440 ml  Output 775 ml  Net -335 ml   Filed Weights   09/03/22 1357  Weight: 60.8 kg    Exam:  General:  Hypersomnolent today but arouses to voice and touch and answers limited questions Eyes:  normal lids, iris ENT:  grossly normal hearing, lips & tongue, mmm, upper dentures only Neck:  no LAD, masses or thyromegaly Cardiovascular:  RRR, no m/r/g. No LE edema.  Respiratory:   CTA bilaterally with no wheezes/rales/rhonchi.  Normal respiratory effort. Abdomen:  soft, NT, ND Skin:  no rash or induration seen on limited exam Musculoskeletal:  grossly normal tone BUE/BLE, good ROM, no bony abnormality Psychiatric:  somnolent mood and affect, speech sparse but appropriate Neurologic:  CN 2-12 grossly intact, moves all extremities in coordinated fashion  Data Reviewed: I have reviewed the patient's lab results since admission.  Pertinent labs for today include:   K+ 3.4 Glucose 124 WBC 9.6 Hgb 8.3 Platelets 556     Family Communication: Family meeting for GOC  Disposition: Status is: Inpatient Remains inpatient appropriate because: unsafe disposition     Time spent: 50 minutes + GOC meeting lasting 40 minutes    Author: Victorino Dike  Ophelia Charter, MD 09/08/2022 4:18 PM  For on call review [email or web address].

## 2022-09-08 NOTE — Plan of Care (Signed)

## 2022-09-08 NOTE — Progress Notes (Signed)
Pt's O2 level improves when lying position on the side. In the supine position, O2 levels were 86-91% despite being on 4L.However, in the side -lying position, O2 levels were 94-98% on 4L. Will continue to changing pt to the side lying position every 2 hrs.

## 2022-09-08 NOTE — Plan of Care (Signed)
  Problem: Clinical Measurements: Goal: Respiratory complications will improve Outcome: Progressing   Problem: Activity: Goal: Risk for activity intolerance will decrease Outcome: Progressing   Problem: Safety: Goal: Ability to remain free from injury will improve Outcome: Progressing   Problem: Skin Integrity: Goal: Risk for impaired skin integrity will decrease Outcome: Progressing

## 2022-09-08 NOTE — Progress Notes (Signed)
   09/07/22 2101  Vitals  Temp 99.7 F (37.6 C)  Temp Source Oral  BP (!) 148/68  MAP (mmHg) 92  BP Location Right Arm  BP Method Automatic  Patient Position (if appropriate) Lying  Pulse Rate 87  Pulse Rate Source Dinamap  Resp (!) 35  MEWS COLOR  MEWS Score Color Yellow  Oxygen Therapy  SpO2 95 %  O2 Device Nasal Cannula  O2 Flow Rate (L/min) 6 L/min  MEWS Score  MEWS Temp 0  MEWS Systolic 0  MEWS Pulse 0  MEWS RR 2  MEWS LOC 1  MEWS Score 3   Started Yellow MEWS. CN and attending On call made aware.Will continue to monitor.

## 2022-09-09 DIAGNOSIS — J9600 Acute respiratory failure, unspecified whether with hypoxia or hypercapnia: Secondary | ICD-10-CM | POA: Diagnosis not present

## 2022-09-09 DIAGNOSIS — B370 Candidal stomatitis: Secondary | ICD-10-CM | POA: Diagnosis not present

## 2022-09-09 DIAGNOSIS — J189 Pneumonia, unspecified organism: Secondary | ICD-10-CM | POA: Diagnosis not present

## 2022-09-09 DIAGNOSIS — R059 Cough, unspecified: Secondary | ICD-10-CM | POA: Diagnosis not present

## 2022-09-09 DIAGNOSIS — R600 Localized edema: Secondary | ICD-10-CM | POA: Diagnosis not present

## 2022-09-09 DIAGNOSIS — F419 Anxiety disorder, unspecified: Secondary | ICD-10-CM | POA: Diagnosis not present

## 2022-09-09 DIAGNOSIS — J9611 Chronic respiratory failure with hypoxia: Secondary | ICD-10-CM | POA: Diagnosis present

## 2022-09-09 DIAGNOSIS — Z7401 Bed confinement status: Secondary | ICD-10-CM | POA: Diagnosis not present

## 2022-09-09 DIAGNOSIS — G894 Chronic pain syndrome: Secondary | ICD-10-CM | POA: Diagnosis not present

## 2022-09-09 DIAGNOSIS — J984 Other disorders of lung: Secondary | ICD-10-CM | POA: Diagnosis not present

## 2022-09-09 DIAGNOSIS — R41841 Cognitive communication deficit: Secondary | ICD-10-CM | POA: Diagnosis present

## 2022-09-09 DIAGNOSIS — R1312 Dysphagia, oropharyngeal phase: Secondary | ICD-10-CM | POA: Diagnosis present

## 2022-09-09 DIAGNOSIS — B351 Tinea unguium: Secondary | ICD-10-CM | POA: Diagnosis not present

## 2022-09-09 DIAGNOSIS — R0602 Shortness of breath: Secondary | ICD-10-CM | POA: Diagnosis not present

## 2022-09-09 DIAGNOSIS — I10 Essential (primary) hypertension: Secondary | ICD-10-CM | POA: Diagnosis not present

## 2022-09-09 DIAGNOSIS — R296 Repeated falls: Secondary | ICD-10-CM | POA: Diagnosis present

## 2022-09-09 DIAGNOSIS — R531 Weakness: Secondary | ICD-10-CM | POA: Diagnosis not present

## 2022-09-09 DIAGNOSIS — J188 Other pneumonia, unspecified organism: Secondary | ICD-10-CM | POA: Diagnosis present

## 2022-09-09 DIAGNOSIS — J449 Chronic obstructive pulmonary disease, unspecified: Secondary | ICD-10-CM | POA: Diagnosis not present

## 2022-09-09 DIAGNOSIS — K219 Gastro-esophageal reflux disease without esophagitis: Secondary | ICD-10-CM | POA: Diagnosis not present

## 2022-09-09 DIAGNOSIS — D649 Anemia, unspecified: Secondary | ICD-10-CM | POA: Diagnosis not present

## 2022-09-09 DIAGNOSIS — M069 Rheumatoid arthritis, unspecified: Secondary | ICD-10-CM | POA: Diagnosis not present

## 2022-09-09 DIAGNOSIS — D500 Iron deficiency anemia secondary to blood loss (chronic): Secondary | ICD-10-CM | POA: Diagnosis not present

## 2022-09-09 DIAGNOSIS — R918 Other nonspecific abnormal finding of lung field: Secondary | ICD-10-CM | POA: Diagnosis not present

## 2022-09-09 DIAGNOSIS — E538 Deficiency of other specified B group vitamins: Secondary | ICD-10-CM | POA: Diagnosis not present

## 2022-09-09 DIAGNOSIS — R195 Other fecal abnormalities: Secondary | ICD-10-CM | POA: Diagnosis present

## 2022-09-09 DIAGNOSIS — R109 Unspecified abdominal pain: Secondary | ICD-10-CM | POA: Diagnosis not present

## 2022-09-09 DIAGNOSIS — R319 Hematuria, unspecified: Secondary | ICD-10-CM | POA: Diagnosis not present

## 2022-09-09 DIAGNOSIS — Z7189 Other specified counseling: Secondary | ICD-10-CM | POA: Diagnosis not present

## 2022-09-09 DIAGNOSIS — E039 Hypothyroidism, unspecified: Secondary | ICD-10-CM | POA: Diagnosis not present

## 2022-09-09 DIAGNOSIS — G8929 Other chronic pain: Secondary | ICD-10-CM | POA: Diagnosis not present

## 2022-09-09 DIAGNOSIS — K5900 Constipation, unspecified: Secondary | ICD-10-CM | POA: Diagnosis not present

## 2022-09-09 DIAGNOSIS — J690 Pneumonitis due to inhalation of food and vomit: Secondary | ICD-10-CM | POA: Diagnosis present

## 2022-09-09 DIAGNOSIS — R339 Retention of urine, unspecified: Secondary | ICD-10-CM | POA: Diagnosis not present

## 2022-09-09 DIAGNOSIS — R0989 Other specified symptoms and signs involving the circulatory and respiratory systems: Secondary | ICD-10-CM | POA: Diagnosis not present

## 2022-09-09 DIAGNOSIS — D72829 Elevated white blood cell count, unspecified: Secondary | ICD-10-CM | POA: Diagnosis not present

## 2022-09-09 DIAGNOSIS — R410 Disorientation, unspecified: Secondary | ICD-10-CM | POA: Diagnosis present

## 2022-09-09 DIAGNOSIS — R5381 Other malaise: Secondary | ICD-10-CM | POA: Diagnosis not present

## 2022-09-09 DIAGNOSIS — F32A Depression, unspecified: Secondary | ICD-10-CM | POA: Diagnosis not present

## 2022-09-09 DIAGNOSIS — Z1152 Encounter for screening for COVID-19: Secondary | ICD-10-CM | POA: Diagnosis not present

## 2022-09-09 DIAGNOSIS — R262 Difficulty in walking, not elsewhere classified: Secondary | ICD-10-CM | POA: Diagnosis present

## 2022-09-09 DIAGNOSIS — Z9181 History of falling: Secondary | ICD-10-CM | POA: Diagnosis not present

## 2022-09-09 DIAGNOSIS — Z466 Encounter for fitting and adjustment of urinary device: Secondary | ICD-10-CM | POA: Diagnosis present

## 2022-09-09 DIAGNOSIS — R911 Solitary pulmonary nodule: Secondary | ICD-10-CM | POA: Diagnosis present

## 2022-09-09 DIAGNOSIS — M6281 Muscle weakness (generalized): Secondary | ICD-10-CM | POA: Diagnosis present

## 2022-09-09 DIAGNOSIS — N390 Urinary tract infection, site not specified: Secondary | ICD-10-CM | POA: Diagnosis not present

## 2022-09-09 DIAGNOSIS — E559 Vitamin D deficiency, unspecified: Secondary | ICD-10-CM | POA: Diagnosis not present

## 2022-09-09 LAB — BASIC METABOLIC PANEL
Anion gap: 11 (ref 5–15)
BUN: 10 mg/dL (ref 8–23)
CO2: 23 mmol/L (ref 22–32)
Calcium: 8 mg/dL — ABNORMAL LOW (ref 8.9–10.3)
Chloride: 101 mmol/L (ref 98–111)
Creatinine, Ser: 0.66 mg/dL (ref 0.44–1.00)
GFR, Estimated: >60 mL/min (ref >60)
Glucose, Bld: 140 mg/dL — ABNORMAL HIGH (ref 70–99)
Potassium: 3.1 mmol/L — ABNORMAL LOW (ref 3.5–5.1)
Sodium: 135 mmol/L (ref 135–145)

## 2022-09-09 LAB — CULTURE, BLOOD (ROUTINE X 2)
Culture: NO GROWTH
Culture: NO GROWTH

## 2022-09-09 LAB — CBC WITH DIFFERENTIAL/PLATELET
Abs Immature Granulocytes: 0.59 10*3/uL — ABNORMAL HIGH (ref 0.00–0.07)
Basophils Absolute: 0.1 10*3/uL (ref 0.0–0.1)
Basophils Relative: 1 %
Eosinophils Absolute: 0.7 10*3/uL — ABNORMAL HIGH (ref 0.0–0.5)
Eosinophils Relative: 5 %
HCT: 27.6 % — ABNORMAL LOW (ref 36.0–46.0)
Hemoglobin: 8.1 g/dL — ABNORMAL LOW (ref 12.0–15.0)
Immature Granulocytes: 5 %
Lymphocytes Relative: 11 %
Lymphs Abs: 1.3 10*3/uL (ref 0.7–4.0)
MCH: 26.9 pg (ref 26.0–34.0)
MCHC: 29.3 g/dL — ABNORMAL LOW (ref 30.0–36.0)
MCV: 91.7 fL (ref 80.0–100.0)
Monocytes Absolute: 1.3 10*3/uL — ABNORMAL HIGH (ref 0.1–1.0)
Monocytes Relative: 11 %
Neutro Abs: 8.1 10*3/uL — ABNORMAL HIGH (ref 1.7–7.7)
Neutrophils Relative %: 67 %
Platelets: 534 10*3/uL — ABNORMAL HIGH (ref 150–400)
RBC: 3.01 MIL/uL — ABNORMAL LOW (ref 3.87–5.11)
RDW: 19.3 % — ABNORMAL HIGH (ref 11.5–15.5)
WBC: 12.1 10*3/uL — ABNORMAL HIGH (ref 4.0–10.5)
nRBC: 0 % (ref 0.0–0.2)

## 2022-09-09 MED ORDER — POTASSIUM CHLORIDE 20 MEQ PO PACK
40.0000 meq | PACK | Freq: Once | ORAL | Status: AC
  Administered 2022-09-09: 40 meq via ORAL
  Filled 2022-09-09: qty 2

## 2022-09-09 MED ORDER — FENTANYL 50 MCG/HR TD PT72: 1.0000 | MEDICATED_PATCH | TRANSDERMAL | 0 refills | Status: AC

## 2022-09-09 MED ORDER — SENNOSIDES-DOCUSATE SODIUM 8.6-50 MG PO TABS: 2.0000 | ORAL_TABLET | Freq: Two times a day (BID) | ORAL | Status: AC

## 2022-09-09 MED ORDER — GERHARDT'S BUTT CREAM: 1.0000 | TOPICAL_CREAM | Freq: Four times a day (QID) | CUTANEOUS | Status: AC | PRN

## 2022-09-09 MED ORDER — LORAZEPAM 0.5 MG PO TABS: 0.5000 mg | ORAL_TABLET | Freq: Two times a day (BID) | ORAL | 0 refills | Status: AC

## 2022-09-09 MED ORDER — ORAL CARE MOUTH RINSE: 15.0000 mL | OROMUCOSAL | Status: DC | PRN

## 2022-09-09 MED ORDER — PANTOPRAZOLE SODIUM 40 MG PO TBEC: 40.0000 mg | DELAYED_RELEASE_TABLET | Freq: Two times a day (BID) | ORAL | Status: AC

## 2022-09-09 MED ORDER — OXYCODONE HCL 5 MG PO TABS: 5.0000 mg | ORAL_TABLET | Freq: Four times a day (QID) | ORAL | 0 refills | Status: AC | PRN

## 2022-09-09 MED ORDER — PANTOPRAZOLE SODIUM 40 MG PO TBEC: 40.0000 mg | DELAYED_RELEASE_TABLET | Freq: Two times a day (BID) | ORAL | Status: DC

## 2022-09-09 NOTE — Discharge Summary (Addendum)
Physician Discharge Summary   Patient: Jocelyn Bauer MRN: [id number] DOB: 1928/09/20  Admit date:     09/03/2022  Discharge date: 09/09/22  Discharge Physician: Jonah Blue   PCP: Rodrigo Ran, MD   Recommendations at discharge:   Consider ongoing palliative care consultation at facility No additional antibiotics are needed Patient/family have expressed desire for SNF rehab at [facility] If she continues to improve, she would like to strengthen enough to return home; otherwise, no further treatment is desired and she would prefer to transition to comfort care Patient is DNR Continue fentanyl patch and Tylenol, give Oxy IR only if these medications are not controlling pain.  Discharge Diagnoses: Principal Problem:   Symptomatic anemia Active Problems:   Chronic neck and back pain   RA (rheumatoid arthritis) (HCC)   DNR (do not resuscitate)   Multifocal pneumonia   Delirium   Occult blood in stools   Lung nodule   Ambulatory dysfunction   Hospital Course: 89yo with h/o chronic deconditioning/ambulatory dysfunction in wheelchair presenting with AMS.  She developed dysuria a week ago and was treated with Bactrim and developed confusion, hallucinations, and generalized weakness.  She was noted to be 85% on room air, now stable on 2L [place] O2.  She was diagnosed with aspiration PNA and started on Ceftriaxone -> Unasyn and Azithromycin.  Hgb dropping, heme positive, very lethargic so concern for GI bleeding and given transfusion with dramatic improvement.  Now acknowledges black stools for some time.  Takes NSAIDs.  Overnight 8/30-31 had acute respiratory decompensation requiring NRB O2.  She is improving somewhat from that standpoint.  She is continuing to have large black stools.  She has continued with intermittent lethargy and limited improvement.  Based on GOC conversation on 9/1, will not further escalate care but will continue abx for 7 total days and consider transition to comfort  based on response to treatment.  Assessment and Plan:   Symptomatic anemia Patient with Hgb 9.6 in May, on admission 7.5 -> 7.1 and heme positive In talking with her daughter, primary concerns were AMS, DOE, and fatigue - c/w symptomatic anemia Concern for GI bleed, but given her age and frailty, further evaluation may not be reasonable (see below) Family amenable to transfusion, transfused 1 unit PRBC  Hgb improved to 8.3 and patient dramatically improved clinically transiently and then returned to prior depressed LOC without decline in Hgb Hgb stable despite clinical deterioration Continuing to have large black BMs Continue to follow clinically for now without plan for change in intervention Recheck CBC in AM, although ? If transfusion would be given again   Ambulatory dysfunction Wheelchair dependent at baseline but currently having more difficulty transferring PT/OT consults Appears likely to need SNF Family requests [facility] given that she is more medically stable - ready when bed is available   Lung nodule Increased size. May need outpatient work up in this regard. Continue home O2, currently on 4L   Occult blood in stools Patient presenting with likely upper GI bleeding There was no report of frank bleeding or melena from patient or family on presentation but patient now acknowledges that her stools have been very black She is having additional tarry stools Transition to PO Protonix BID Given her extremely high risk of poor outcome with EGD, will hold on GI consult based on discussion with family Needs avoidance of all NSAIDs   Delirium Reports chronic episodes of patient having confusion with various infections.   Currently decompensated, possibly from infection and/or symptomatic  anemia CT head is negative Hold narcotics as much as possible Taper ativan to 0.25 mg bid. I would favor ceasing this medication over the long term. Add delirium precautions Significantly  improved with halt in chronic pain medications as well as post-transfusion on 8/29 Recurrent intermittent hypersomnolence since   Multifocal pneumonia She is on 2L home O2 but has had worsening SOB for several weeks that worsened after she vomited recently Negative COVID and RVP testing Some concern for aspiration, SLP evaluation with only mild aspiration risk Given ceftriaxone and azithromycin in the ER, changed to Unasyn and azithromycin.   She is on methotrexate and prednisone and so is immunocompromised as well Increased respiratory distress overnight on 8/30, placed on NRB; has been weaned back to 4L [place] O2 Hold on VQ scan (ordered based on respiratory event on 8/31 AM and significantly elevated D-dimer) Has completed abx as of today, no additional treatment is indicated at this time   DNR (do not resuscitate)/Goals of Care Patient is DNR Various family members have discussed the idea of transition to comfort care but palliative care meeting today was held on 8/30 and daughter said she absolutely wants to treat the treatable I also spoke with her other daughter, who appears to be recognizing the severity of her mother's condition Family meeting on 9/1 for goals of care - after prolonged discussion, we will complete the antibiotics for 7 total days but will not escalate care further She is likely to benefit from outpatient palliative care consultation   RA (rheumatoid arthritis) (HCC) Hold methotrexate, continue with prednisone 5 mg daily.  Patient should get stress dose steroids in case of any vital instability.   Chronic neck and back pain Home home Fentanyl patch Separate oxy IR dosing from Tylenol - continue both medications but give Oxy only prn if Tylenol and fentanyl aren't effective I have reviewed this patient in the [place] Controlled Substances Reporting System.  She is receiving medications from multiple providers and IS at particularly high risk of opioid misuse, diversion, or  overdose.    Urinary retention Recurrent, catheter placed Can do voiding trial at SNF in the future if appropriate         Consultants: Palliative care PT/OT SLP [facility] team   Procedures: None   Antibiotics: Ceftriaxone 8/27 Unasyn 8/27- Azithromycin 8/27-31     30 Day Unplanned Readmission Risk Score     Flowsheet Row ED to Hosp-Admission (Current) from 09/03/2022 in [facility] 4TH FLOOR PROGRESSIVE CARE AND UROLOGY  30 Day Unplanned Readmission Risk Score (%) 22.05 Filed at 09/09/2022 0801           This score is the patient's risk of an unplanned readmission within 30 days of being discharged (0 -100%). The score is based on dignosis, age, lab data, medications, orders, and past utilization.   Low:  0-14.9   Medium: 15-21.9   High: 22-29.9   Extreme: 30 and above            Pain control - [place] Controlled Substance Reporting System database was reviewed. and patient was instructed, not to drive, operate heavy machinery, perform activities at heights, swimming or participation in water activities or provide baby-sitting services while on Pain, Sleep and Anxiety Medications; until their outpatient Physician has advised to do so again. Also recommended to not to take more than prescribed Pain, Sleep and Anxiety Medications.   Disposition: Skilled nursing facility Diet recommendation:  Regular diet DISCHARGE MEDICATION: Allergies as of 09/09/2022  Reactions   Streptococcus (diplococcus) Pneumoniae [streptococci] Shortness Of Breath   Contrast Media [iodinated Contrast Media] Itching, Other (See Comments)   Itching.( 13 hr pre medication regimen worked fine. )   Tdap [tetanus-diphth-acell Pertussis] Hives   Cholestyramine Other (See Comments)   "Stomach felt too full"   Duloxetine Hcl Other (See Comments)   "Didn't sleep, but just took one pill"   Etidronate Diarrhea   Ezetimibe-simvastatin Other (See Comments)   Reaction not noted   Hydromorphone  Other (See Comments)   Reaction not noted   Pravastatin Other (See Comments)   "With daily dose just hurt more"        Medication List     STOP taking these medications    cyclobenzaprine 10 MG tablet Commonly known as: FLEXERIL   omeprazole 20 MG capsule Commonly known as: PRILOSEC   oxyCODONE-acetaminophen 10-325 MG tablet Commonly known as: PERCOCET       TAKE these medications    acetaminophen 325 MG tablet Commonly known as: TYLENOL Take 2 tablets (650 mg total) by mouth 3 (three) times daily.   albuterol (2.5 MG/3ML) 0.083% nebulizer solution Commonly known as: PROVENTIL Take 3 mLs (2.5 mg total) by nebulization every 2 (two) hours as needed for wheezing.   aspirin EC 81 MG tablet Take 1 tablet (81 mg total) by mouth daily. Swallow whole. What changed: when to take this   benazepril 20 MG tablet Commonly known as: LOTENSIN Take 20 mg by mouth every morning.   bisacodyl 5 MG EC tablet Commonly known as: DULCOLAX Take 1 tablet (5 mg total) by mouth daily as needed for moderate constipation.   cholecalciferol 25 MCG (1000 UNIT) tablet Commonly known as: VITAMIN D3 Take 2,000 Units by mouth daily.   cyanocobalamin 1000 MCG tablet Commonly known as: VITAMIN B12 Take 1,000 mcg by mouth daily.   denosumab 60 MG/ML Sosy injection Commonly known as: PROLIA Inject 60 mg into the skin every 6 (six) months.   fentaNYL 50 MCG/HR Commonly known as: DURAGESIC Place 1 patch onto the skin every 3 (three) days.   folic acid 400 MCG tablet Commonly known as: FOLVITE Take 400 mcg by mouth daily.   furosemide 40 MG tablet Commonly known as: LASIX Take 40 mg by mouth in the morning.   Gerhardt's butt cream Crea Apply 1 Application topically 4 (four) times daily as needed for irritation.   guaiFENesin 600 MG 12 hr tablet Commonly known as: MUCINEX Take 1 tablet (600 mg total) by mouth 2 (two) times daily as needed for cough.   levothyroxine 125 MCG  tablet Commonly known as: SYNTHROID Take 125 mcg by mouth daily before breakfast.   LORazepam 0.5 MG tablet Commonly known as: ATIVAN Take 1 tablet (0.5 mg total) by mouth in the morning and at bedtime.   methocarbamol 500 MG tablet Commonly known as: ROBAXIN Take 500 mg by mouth 2 (two) times daily as needed for muscle spasms.   methotrexate 2.5 MG tablet Commonly known as: RHEUMATREX Take 15 mg by mouth every Sunday.   oxyCODONE 5 MG immediate release tablet Commonly known as: Oxy IR/ROXICODONE Take 1 tablet (5 mg total) by mouth every 6 (six) hours as needed for severe pain or moderate pain.   pantoprazole 40 MG tablet Commonly known as: PROTONIX Take 1 tablet (40 mg total) by mouth 2 (two) times daily before a meal.   polyethylene glycol 17 g packet Commonly known as: MIRALAX / GLYCOLAX Take 17 g by mouth daily as  needed for mild constipation.   potassium chloride 10 MEQ tablet Commonly known as: KLOR-CON Take 10 mEq by mouth daily.   predniSONE 5 MG tablet Commonly known as: DELTASONE Take 1 tablet (5 mg total) by mouth daily. What changed: when to take this   senna-docusate 8.6-50 MG tablet Commonly known as: Senokot-S Take 2 tablets by mouth 2 (two) times daily.   sertraline 50 MG tablet Commonly known as: ZOLOFT Take 50 mg by mouth daily.        Contact information for after-discharge care     Destination     [facility] Preferred SNF .   Service: Skilled Nursing Contact information: [address] [place] [address] [phone]                    Discharge Exam: Ceasar Mons Weights   09/03/22 1357  Weight: 60.8 kg     Subjective: She is much more alert today. She says that she wants to get well and wants to go to [place] for rehab. She is in agreement with plan, however, for no escalation of care should she worsen. Unasyn completed today, no further treatment is indicated  at this time. Medically stable for rehab with guarded prognosis.    Objective: Vitals:   09/09/22 0642 09/09/22 1142  BP:    Pulse:    Resp:    Temp:    SpO2: 96% (!) 81%    Intake/Output Summary (Last 24 hours) at 09/09/2022 1319 Last data filed at 09/09/2022 1128 Gross per 24 hour  Intake 703 ml  Output 1450 ml  Net -747 ml   Filed Weights   09/03/22 1357  Weight: 60.8 kg    Exam:  General:  Alert, awake, conversant today - much better than recent days Eyes:  normal lids, iris ENT:  grossly normal hearing, lips & tongue, mmm, upper dentures only Neck:  no LAD, masses or thyromegaly Cardiovascular:  RRR, no m/r/g. No LE edema.  Respiratory:   CTA bilaterally with no wheezes/rales/rhonchi.  Normal respiratory effort. Abdomen:  soft, NT, ND Skin:  no rash or induration seen on limited exam Musculoskeletal:  grossly normal tone BUE/BLE, good ROM, no bony abnormality Psychiatric:  somnolent mood and affect, speech sparse but appropriate Neurologic:  CN 2-12 grossly intact, moves all extremities in coordinated fashion   Data Reviewed: I have reviewed the patient's lab results since admission.  Pertinent labs for today include:   K+ 3.1 Glucose 140 WBC 12.1 Hgb 8.1 Platelets 534     Family Communication: Many family members were present this AM    Condition at discharge: fair  The results of significant diagnostics from this hospitalization (including imaging, microbiology, ancillary and laboratory) are listed below for reference.   Imaging Studies: DG Chest Port 1 View  Result Date: 09/07/2022 CLINICAL DATA:  Shortness of breath. EXAM: PORTABLE CHEST 1 VIEW COMPARISON:  09/03/2022. FINDINGS: The heart is enlarged and the mediastinal contour stable. There is atherosclerotic calcification of the aorta. Scattered airspace opacities are noted bilaterally most pronounced in the right upper lobe. No effusion or pneumothorax is seen. Old rib fractures are noted on the  left. No acute osseous abnormality. IMPRESSION: Stable airspace disease in the lungs bilaterally, most pronounced in the right upper lobe, possible edema versus multifocal pneumonia. Electronically Signed   By: Thornell Sartorius M.D.   On: 09/07/2022 03:33   VAS [place] LOWER EXTREMITY VENOUS (DVT)  Result Date: 09/04/2022  Lower Venous DVT Study Patient Name:  SEPTEMBER BRIZZOLARA  Date of Exam:   09/04/2022 Medical Rec #: [id number]        Accession #:    [id number] Date of Birth: [birth year]         Patient Gender: F Patient Age:   53 years Exam Location:  [facility] Procedure:      VAS [place] LOWER EXTREMITY VENOUS (DVT) Referring Phys: [facility] GOEL --------------------------------------------------------------------------------  Indications: Swelling.  Risk Factors: None identified. Limitations: Poor ultrasound/tissue interface, body habitus and patient positioning, patient immobility, patient pain tolerance. Comparison Study: No prior studies. Performing Technologist: Chanda Busing RVT  Examination Guidelines: A complete evaluation includes B-mode imaging, spectral Doppler, color Doppler, and power Doppler as needed of all accessible portions of each vessel. Bilateral testing is considered an integral part of a complete examination. Limited examinations for reoccurring indications may be performed as noted. The reflux portion of the exam is performed with the patient in reverse Trendelenburg.  +---------+---------------+---------+-----------+----------+-------------------+ RIGHT    CompressibilityPhasicitySpontaneityPropertiesThrombus Aging      +---------+---------------+---------+-----------+----------+-------------------+ CFV      Full           Yes      Yes                                      +---------+---------------+---------+-----------+----------+-------------------+ SFJ      Full                                                              +---------+---------------+---------+-----------+----------+-------------------+ FV Prox  Full                                                             +---------+---------------+---------+-----------+----------+-------------------+ FV Mid   Full                                                             +---------+---------------+---------+-----------+----------+-------------------+ FV DistalFull                                                             +---------+---------------+---------+-----------+----------+-------------------+ PFV      Full                                                             +---------+---------------+---------+-----------+----------+-------------------+ POP      Full           Yes  Yes                                      +---------+---------------+---------+-----------+----------+-------------------+ PTV      Full                                                             +---------+---------------+---------+-----------+----------+-------------------+ PERO                                                  Not well visualized +---------+---------------+---------+-----------+----------+-------------------+   +---------+---------------+---------+-----------+----------+-------------------+ LEFT     CompressibilityPhasicitySpontaneityPropertiesThrombus Aging      +---------+---------------+---------+-----------+----------+-------------------+ CFV      Full           Yes      Yes                                      +---------+---------------+---------+-----------+----------+-------------------+ SFJ      Full                                                             +---------+---------------+---------+-----------+----------+-------------------+ FV Prox  Full                                                             +---------+---------------+---------+-----------+----------+-------------------+ FV  Mid   Full           Yes      Yes                                      +---------+---------------+---------+-----------+----------+-------------------+ FV Distal               Yes      Yes                                      +---------+---------------+---------+-----------+----------+-------------------+ PFV      Full                                                             +---------+---------------+---------+-----------+----------+-------------------+ POP      Full           Yes      Yes                                      +---------+---------------+---------+-----------+----------+-------------------+  PTV                                                   Not well visualized +---------+---------------+---------+-----------+----------+-------------------+ PERO                                                  Not well visualized +---------+---------------+---------+-----------+----------+-------------------+     Summary: RIGHT: - There is no evidence of deep vein thrombosis in the lower extremity. However, portions of this examination were limited- see technologist comments above.  - No cystic structure found in the popliteal fossa.  LEFT: - There is no evidence of deep vein thrombosis in the lower extremity. However, portions of this examination were limited- see technologist comments above.  - No cystic structure found in the popliteal fossa.  *See table(s) above for measurements and observations. Electronically signed by Sherald Hess MD on 09/04/2022 at 1:11:34 PM.    Final    DG Chest 2 View  Result Date: 09/03/2022 CLINICAL DATA:  Cough, frequent urination, confusion EXAM: CHEST - 2 VIEW COMPARISON:  05/26/2022 FINDINGS: Frontal and lateral views of the chest demonstrate an unremarkable cardiac silhouette. There is bilateral airspace disease most pronounced within the right upper lobe, which may reflect asymmetric edema or multifocal bronchopneumonia. No  effusion or pneumothorax. No acute bony abnormalities. IMPRESSION: 1. Bilateral airspace disease greatest in the right upper lobe, which could reflect multifocal bronchopneumonia or asymmetric edema. Electronically Signed   By: Sharlet Salina M.D.   On: 09/03/2022 18:14   CT ABDOMEN PELVIS WO CONTRAST  Result Date: 09/03/2022 CLINICAL DATA:  Increased urinary frequency and altered mental status EXAM: CT ABDOMEN AND PELVIS WITHOUT CONTRAST TECHNIQUE: Multidetector CT imaging of the abdomen and pelvis was performed following the standard protocol without IV contrast. RADIATION DOSE REDUCTION: This exam was performed according to the departmental dose-optimization program which includes automated exposure control, adjustment of the mA and/or kV according to patient size and/or use of iterative reconstruction technique. COMPARISON:  CT abdomen and pelvis dated 05/26/2022 and multiple priors FINDINGS: Lower chest: Left basilar nodule measured 8 x 6 mm (7:22), unchanged from 11/02/2021 but slightly enlarged in size from 5 x 4 mm on 03/31/2014. Partially imaged bibasilar scarring and traction bronchiectasis. No pleural effusion or pneumothorax demonstrated. Partially imaged heart size is normal. Coronary artery calcifications. Hepatobiliary: No focal hepatic lesions. No intra or extrahepatic biliary ductal dilation. Normal gallbladder. Pancreas: Diffuse parenchymal atrophy.  No main ductal dilation. Spleen: Normal in size without focal abnormality. Adrenals/Urinary Tract: No adrenal nodules. No suspicious renal mass by noncontrast technique or hydronephrosis. Punctate nonobstructing right interpolar stone. No focal bladder wall thickening. Stomach/Bowel: Moderate hiatal hernia. Normal appearance of the stomach. No evidence of bowel wall thickening, distention, or inflammatory changes. Appendix is not discretely seen. Vascular/Lymphatic: Aortic atherosclerosis. No enlarged abdominal or pelvic lymph nodes. Reproductive:  No adnexal masses. Other: No free fluid, fluid collection, or free air. Musculoskeletal: No acute or abnormal lytic or blastic osseous lesions. Multilevel degenerative changes of the partially imaged thoracic and lumbar spine. Unchanged inferior endplate compression deformity of L4. Partially imaged left hip arthroplasty. Old fracture of left inferior pubic ramus. IMPRESSION: 1. No acute abnormality in the abdomen or  pelvis. 2. Punctate nonobstructing right interpolar renal stone. 3. Left basilar lung nodule measures 8 x 6 mm, unchanged from 11/02/2021 but slightly enlarged in size from 5 x 4 mm on 03/31/2014. Non-contrast chest CT at 6-12 months is recommended. If the nodule is stable at time of repeat CT, then future CT at 18-24 months (from today's scan) is considered optional for low-risk patients, but is recommended for high-risk patients. This recommendation follows the consensus statement: Guidelines for Management of Incidental Pulmonary Nodules Detected on CT Images: From the Fleischner Society 2017; Radiology 2017; 284:228-243. 4. Moderate hiatal hernia. 5. Aortic Atherosclerosis (ICD10-I70.0). Coronary artery calcifications. Assessment for potential risk factor modification, dietary therapy or pharmacologic therapy may be warranted, if clinically indicated. Electronically Signed   By: Agustin Cree M.D.   On: 09/03/2022 17:59   CT Head Wo Contrast  Result Date: 09/03/2022 CLINICAL DATA:  Provided history: Altered mental status, nontraumatic. Frequent urination. Confusion. EXAM: CT HEAD WITHOUT CONTRAST TECHNIQUE: Contiguous axial images were obtained from the base of the skull through the vertex without intravenous contrast. RADIATION DOSE REDUCTION: This exam was performed according to the departmental dose-optimization program which includes automated exposure control, adjustment of the mA and/or kV according to patient size and/or use of iterative reconstruction technique. COMPARISON:  Head CT  07/26/2019. FINDINGS: Brain: Generalized parenchymal atrophy. Patchy and ill-defined hypoattenuation within the cerebral white matter, nonspecific but compatible with moderate chronic small vessel ischemic disease. There is no acute intracranial hemorrhage. No demarcated cortical infarct. No extra-axial fluid collection. No evidence of an intracranial mass. No midline shift. Vascular: No hyperdense vessel.  Atherosclerotic calcifications. Skull: No calvarial fracture or aggressive osseous lesion. Sinuses/Orbits: No mass or acute finding within the imaged orbits. Minimal mucosal thickening within the left maxillary sinus at the imaged levels. Other: Small-Volume fluid within the bilateral mastoid air cells. IMPRESSION: 1. No evidence of an acute intracranial abnormality. 2. Moderate chronic small vessel ischemic changes within the cerebral white matter. 3. Generalized parenchymal atrophy. 4. Small-volume fluid within the bilateral mastoid air cells. Electronically Signed   By: Jackey Loge D.O.   On: 09/03/2022 17:46    Microbiology: Results for orders placed or performed during the hospital encounter of 09/03/22  Respiratory (~20 pathogens) panel by PCR     Status: None   Collection Time: 09/03/22  7:42 PM   Specimen: Nasopharyngeal Swab; Respiratory  Result Value Ref Range Status   Adenovirus NOT DETECTED NOT DETECTED Final   Coronavirus 229E NOT DETECTED NOT DETECTED Final    Comment: (NOTE) The Coronavirus on the Respiratory Panel, DOES NOT test for the novel  Coronavirus (2019 nCoV)    Coronavirus HKU1 NOT DETECTED NOT DETECTED Final   Coronavirus NL63 NOT DETECTED NOT DETECTED Final   Coronavirus OC43 NOT DETECTED NOT DETECTED Final   Metapneumovirus NOT DETECTED NOT DETECTED Final   Rhinovirus / Enterovirus NOT DETECTED NOT DETECTED Final   Influenza A NOT DETECTED NOT DETECTED Final   Influenza B NOT DETECTED NOT DETECTED Final   Parainfluenza Virus 1 NOT DETECTED NOT DETECTED Final    Parainfluenza Virus 2 NOT DETECTED NOT DETECTED Final   Parainfluenza Virus 3 NOT DETECTED NOT DETECTED Final   Parainfluenza Virus 4 NOT DETECTED NOT DETECTED Final   Respiratory Syncytial Virus NOT DETECTED NOT DETECTED Final   Bordetella pertussis NOT DETECTED NOT DETECTED Final   Bordetella Parapertussis NOT DETECTED NOT DETECTED Final   Chlamydophila pneumoniae NOT DETECTED NOT DETECTED Final   Mycoplasma pneumoniae NOT DETECTED  NOT DETECTED Final    Comment: Performed at [facility], [address] [place], [place] [address]  Culture, blood (Routine X 2) w Reflex to ID Panel     Status: None   Collection Time: 09/03/22  9:34 PM   Specimen: BLOOD LEFT HAND  Result Value Ref Range Status   Specimen Description   Final    BLOOD LEFT HAND Performed at [facility], [address] [place], [place] [address]    Special Requests   Final    BOTTLES DRAWN AEROBIC AND ANAEROBIC Blood Culture results may not be optimal due to an inadequate volume of blood received in culture bottles Performed at [facility], [address], [place], [place] [address]    Culture   Final    NO GROWTH 5 DAYS Performed at [facility], [address] [place], [place] [address]    Report Status 09/09/2022 FINAL  Final  Culture, blood (Routine X 2) w Reflex to ID Panel     Status: None   Collection Time: 09/03/22  9:34 PM   Specimen: BLOOD RIGHT HAND  Result Value Ref Range Status   Specimen Description   Final    BLOOD RIGHT HAND Performed at [facility], [address], [place], [place] [address]    Special Requests   Final    BOTTLES DRAWN AEROBIC AND ANAEROBIC Blood Culture results may not be optimal due to an inadequate volume of blood received in culture bottles Performed at [facility], [address], [place], [place] [address]    Culture   Final    NO GROWTH 5 DAYS Performed at [facility], [address]  [place], [place] [address]    Report Status 09/09/2022 FINAL  Final  SARS Coronavirus 2 by RT PCR (hospital order, performed in [facility] hospital lab) *cepheid single result test* Anterior Nasal Swab     Status: None   Collection Time: 09/04/22 10:00 AM   Specimen: Anterior Nasal Swab  Result Value Ref Range Status   SARS Coronavirus 2 by RT PCR NEGATIVE NEGATIVE Final    Comment: (NOTE) SARS-CoV-2 target nucleic acids are NOT DETECTED.  The SARS-CoV-2 RNA is generally detectable in upper and lower respiratory specimens during the acute phase of infection. The lowest concentration of SARS-CoV-2 viral copies this assay can detect is 250 copies / mL. A negative result does not preclude SARS-CoV-2 infection and should not be used as the sole basis for treatment or other patient management decisions.  A negative result may occur with improper specimen collection / handling, submission of specimen other than nasopharyngeal swab, presence of viral mutation(s) within the areas targeted by this assay, and inadequate number of viral copies (<250 copies / mL). A negative result must be combined with clinical observations, patient history, and epidemiological information.  Fact Sheet for Patients:   RoadLapTop.co.za  Fact Sheet for Healthcare Providers: http://kim-miller.com/  This test is not yet approved or  cleared by the [place] FDA and has been authorized for detection and/or diagnosis of SARS-CoV-2 by FDA under an Emergency Use Authorization (EUA).  This EUA will remain in effect (meaning this test can be used) for the duration of the COVID-19 declaration under Section 564(b)(1) of the Act, 21 U.S.C. section 360bbb-3(b)(1), unless the authorization is terminated or revoked sooner.  Performed at [facility], [address], [place], [place] [address]  Labs: CBC: Recent Labs  Lab 09/05/22 0510 09/06/22 0904  09/06/22 1947 09/07/22 0429 09/08/22 0541 09/09/22 0820  WBC 8.3 7.9 6.7 10.4 9.6 12.1*  NEUTROABS 5.2 4.2  --  6.4 5.7 8.1*  HGB 8.3* 8.3* 8.0* 9.6* 8.3* 8.1*  HCT 27.5* 27.0* 27.0* 31.9* 28.1* 27.6*  MCV 91.4 90.9 91.8 91.9 92.7 91.7  PLT 576* 534* 530* 650* 556* 534*   Basic Metabolic Panel: Recent Labs  Lab 09/05/22 0510 09/06/22 0904 09/07/22 0429 09/08/22 0541 09/09/22 0820  NA 133* 136 137 139 135  K 3.1* 3.4* 3.2* 3.4* 3.1*  CL 99 104 102 105 101  CO2 25 23 24 24 23   GLUCOSE 93 104* 117* 124* 140*  BUN 11 9 8 10 10   CREATININE 0.71 0.71 0.66 0.74 0.66  CALCIUM 7.9* 7.8* 8.5* 8.1* 8.0*   Liver Function Tests: Recent Labs  Lab 09/03/22 1452  AST 38  ALT 16  ALKPHOS 65  BILITOT 0.6  PROT 6.5  ALBUMIN 2.8*   CBG: Recent Labs  Lab 09/04/22 1629  GLUCAP 117*    Discharge time spent: greater than 30 minutes.  Signed: Jonah Blue, MD [facility] 09/09/2022

## 2022-09-09 NOTE — Progress Notes (Signed)
Physical Therapy Treatment Patient Details Name: Jocelyn Bauer MRN: [id number] DOB: [birth year] Today's Date: 09/09/2022   History of Present Illness Pt is a 89 yr old female admitted to the hospital with altered mental status and weakness. Found to have PNA, anemia, and GI bleed. PMH: panic attacks, RA, bilateral ankle fractures, chronic pain, HTN, OA, OP    PT Comments  Mod assist for supine to sit and sit to stand, min assist of 2 to transfer bed to recliner with RW. SpO2 81% on 4L O2 with activity, encouraged pursed lip breathing. Patient will benefit from continued inpatient follow up therapy, <3 hours/day.      If plan is discharge home, recommend the following: A lot of help with bathing/dressing/bathroom;A lot of help with walking and/or transfers;Assistance with cooking/housework;Assist for transportation;Help with stairs or ramp for entrance   Can travel by private vehicle     No  Equipment Recommendations  None recommended by PT    Recommendations for Other Services       Precautions / Restrictions Precautions Precautions: Fall Precaution Comments: monitor O2 Restrictions Weight Bearing Restrictions: No     Mobility  Bed Mobility Overal bed mobility: Needs Assistance Bed Mobility: Supine to Sit     Supine to sit: Mod assist, HOB elevated, Used rails Sit to supine: Mod assist   General bed mobility comments: required increased time and effort, as well as assist for raising trunk and for BLE off the bed    Transfers Overall transfer level: Needs assistance Equipment used: Rolling walker (2 wheels) Transfers: Sit to/from Stand, Bed to chair/wheelchair/BSC Sit to Stand: Mod assist, +2 safety/equipment   Step pivot transfers: +2 safety/equipment, Min assist       General transfer comment: assist to power up, able to take a few pivotal steps from bed to recliner, activity tolerance limited by fatigue. SpO2 81% on 4L O2 with activity (RN aware), encouraged  pursed lip breathing    Ambulation/Gait                   Stairs             Wheelchair Mobility     Tilt Bed    Modified Rankin (Stroke Patients Only)       Balance Overall balance assessment: Needs assistance Sitting-balance support: Feet supported, No upper extremity supported Sitting balance-Leahy Scale: Fair     Standing balance support: Bilateral upper extremity supported, During functional activity, Reliant on assistive device for balance Standing balance-Leahy Scale: Poor                              Cognition Arousal: Alert Behavior During Therapy: WFL for tasks assessed/performed                     Orientation Level: Disoriented to, Time, Situation             General Comments: appropriately alert today, able to follow 1 step commands, cooperative        Exercises      General Comments        Pertinent Vitals/Pain Pain Assessment Faces Pain Scale: No hurt    Home Living                          Prior Function            PT Goals (current goals can  now be found in the care plan section) Acute Rehab PT Goals PT Goal Formulation: Patient unable to participate in goal setting Time For Goal Achievement: 09/19/22 Potential to Achieve Goals: Fair Progress towards PT goals: Progressing toward goals    Frequency    Min 1X/week      PT Plan      Co-evaluation              AM-PAC PT "6 Clicks" Mobility   Outcome Measure  Help needed turning from your back to your side while in a flat bed without using bedrails?: A Lot Help needed moving from lying on your back to sitting on the side of a flat bed without using bedrails?: A Lot Help needed moving to and from a bed to a chair (including a wheelchair)?: A Lot Help needed standing up from a chair using your arms (e.g., wheelchair or bedside chair)?: A Lot Help needed to walk in hospital room?: Total Help needed climbing 3-5 steps  with a railing? : Total 6 Click Score: 10    End of Session Equipment Utilized During Treatment: Gait belt Activity Tolerance: Patient limited by fatigue Patient left: with call bell/phone within reach;with family/visitor present;in chair;with chair alarm set Nurse Communication: Mobility status PT Visit Diagnosis: Difficulty in walking, not elsewhere classified (R26.2);Pain;Other abnormalities of gait and mobility (R26.89);Muscle weakness (generalized) (M62.81)     Time: 1130-1140 PT Time Calculation (min) (ACUTE ONLY): 10 min  Charges:    $Therapeutic Activity: 8-22 mins PT General Charges $$ ACUTE PT VISIT: 1 Visit                     Tamala Ser PT 09/09/2022  Acute Rehabilitation Services  Office [phone]

## 2022-09-09 NOTE — Progress Notes (Addendum)
Attempted to call report to [phone] x2. No answer. Attempted again with no answer Report called successfully

## 2022-09-09 NOTE — Progress Notes (Deleted)
Progress Note   Patient: Jocelyn Bauer UJW:[id number] DOB: 09-Apr-1928 DOA: 09/03/2022     6 DOS: the patient was seen and examined on 09/09/2022   Brief hospital course: 89yo with h/o chronic deconditioning/ambulatory dysfunction in wheelchair presenting with AMS.  She developed dysuria a week ago and was treated with Bactrim and developed confusion, hallucinations, and generalized weakness.  She was noted to be 85% on room air, now stable on 2L [place] O2.  She was diagnosed with aspiration PNA and started on Ceftriaxone -> Unasyn and Azithromycin.  Hgb dropping, heme positive, very lethargic so concern for GI bleeding and given transfusion with dramatic improvement.  Now acknowledges black stools for some time.  Takes NSAIDs.  Overnight 8/30-31 had acute respiratory decompensation requiring NRB O2.  She is improving somewhat from that standpoint.  She is continuing to have large black stools.  She has continued with intermittent lethargy and limited improvement.  Based on GOC conversation on 9/1, will not further escalate care but will continue abx for 7 total days and consider transition to comfort based on response to treatment.  Assessment and Plan:  Symptomatic anemia Patient with Hgb 9.6 in May, on admission 7.5 -> 7.1 and heme positive In talking with her daughter, primary concerns were AMS, DOE, and fatigue - c/w symptomatic anemia Concern for GI bleed, but given her age and frailty, further evaluation may not be reasonable (see below) Family amenable to transfusion, transfused 1 unit PRBC  Hgb improved to 8.3 and patient dramatically improved clinically transiently and then returned to prior depressed LOC without decline in Hgb Hgb stable despite clinical deterioration Continuing to have large black BMs Continue to follow clinically for now without plan for change in intervention Recheck CBC in AM, although ? If transfusion would be given again   Ambulatory dysfunction Wheelchair dependent  at baseline but currently having more difficulty transferring PT/OT consults Appears likely to need SNF Family requests [facility] given that she is more medically stable - ready when bed is available   Lung nodule Increased size. May need outpatient work up in this regard.   Occult blood in stools Patient presenting with likely upper GI bleeding There was no report of frank bleeding or melena from patient or family on presentation but patient now acknowledges that her stools have been very black She is having additional tarry stools Transition to PO Protonix BID Given her extremely high risk of poor outcome with EGD, will hold on GI consult based on discussion with family Needs avoidance of all NSAIDs   Delirium Reports chronic episodes of patient having confusion with various infections.   Currently decompensated, possibly from infection and/or symptomatic anemia CT head is negative Hold narcotics as much as possible Taper ativan to 0.25 mg bid. I would favor ceasing this medication over the long term. Add delirium precautions Significantly improved with halt in chronic pain medications as well as post-transfusion on 8/29 Recurrent intermittent hypersomnolence since   Multifocal pneumonia She is on 2L home O2 but has had worsening SOB for several weeks that worsened after she vomited recently Negative COVID and RVP testing Some concern for aspiration, SLP evaluation with only mild aspiration risk Given ceftriaxone and azithromycin in the ER, changed to Unasyn and azithromycin.   She is on methotrexate and prednisone and so is immunocompromised as well Increased respiratory distress overnight on 8/30, placed on NRB; has been weaned back to 4L [place] O2 Hold on VQ scan (ordered based on respiratory event on 8/31  AM and significantly elevated D-dimer) Has completed abx as of today, no additional treatment is indicated at this time   DNR (do not resuscitate)/Goals of Care Patient is  DNR Various family members have discussed the idea of transition to comfort care but palliative care meeting today was held on 8/30 and daughter said she absolutely wants to treat the treatable I also spoke with her other daughter, who appears to be recognizing the severity of her mother's condition Family meeting on 9/1 for goals of care - after prolonged discussion, we will complete the antibiotics for 7 total days but will not escalate care further She is likely to benefit from outpatient palliative care consultation   RA (rheumatoid arthritis) (HCC) Hold methotrexate, continue with prednisone 5 mg daily.  Patient should get stress dose steroids in case of any vital instability.   Chronic neck and back pain Home home Fentanyl patch Separate oxy IR dosing from Tylenol - continue both medications but give Oxy only prn if Tylenol and fentanyl aren't effective I have reviewed this patient in the [place] Controlled Substances Reporting System.  She is receiving medications from multiple providers and IS at particularly high risk of opioid misuse, diversion, or overdose.             Consultants: Palliative care PT/OT SLP TOC team   Procedures: None   Antibiotics: Ceftriaxone 8/27 Unasyn 8/27- Azithromycin 8/27-31   30 Day Unplanned Readmission Risk Score    Flowsheet Row ED to Hosp-Admission (Current) from 09/03/2022 in [facility]TH FLOOR PROGRESSIVE CARE AND UROLOGY  30 Day Unplanned Readmission Risk Score (%) 22.05 Filed at 09/09/2022 0801       This score is the patient's risk of an unplanned readmission within 30 days of being discharged (0 -100%). The score is based on dignosis, age, lab data, medications, orders, and past utilization.   Low:  0-14.9   Medium: 15-21.9   High: 22-29.9   Extreme: 30 and above           Subjective: She is much more alert today.  She says that she wants to get well and wants to go to [facility] [place] for rehab.  She is in agreement with plan,  however, for no escalation of care should she worsen.  Unasyn completed today, no further treatment is indicated at this time.  Medically stable for rehab with guarded prognosis.   Objective: Vitals:   09/09/22 0600 09/09/22 0642  BP:    Pulse:    Resp:    Temp:    SpO2: 98% 96%    Intake/Output Summary (Last 24 hours) at 09/09/2022 0806 Last data filed at 09/09/2022 0547 Gross per 24 hour  Intake 740 ml  Output 975 ml  Net -235 ml   Filed Weights   09/03/22 1357  Weight: 60.8 kg    Exam:  General:  Alert, awake, conversant today - much better than recent days Eyes:  normal lids, iris ENT:  grossly normal hearing, lips & tongue, mmm, upper dentures only Neck:  no LAD, masses or thyromegaly Cardiovascular:  RRR, no m/r/g. No LE edema.  Respiratory:   CTA bilaterally with no wheezes/rales/rhonchi.  Normal respiratory effort. Abdomen:  soft, NT, ND Skin:  no rash or induration seen on limited exam Musculoskeletal:  grossly normal tone BUE/BLE, good ROM, no bony abnormality Psychiatric:  somnolent mood and affect, speech sparse but appropriate Neurologic:  CN 2-12 grossly intact, moves all extremities in coordinated fashion  Data Reviewed: I have reviewed  the patient's lab results since admission.  Pertinent labs for today include:  K+ 3.1 Glucose 140 WBC 12.1 Hgb 8.1 Platelets 534   Family Communication: Many family members were present this AM  Disposition: Status is: Inpatient Remains inpatient appropriate because: unsafe disposition     Time spent: 35 minutes  Unresulted Labs (From admission, onward)    None        Author: Jonah Blue, MD 09/09/2022 8:06 AM  For on call review [email or web address].

## 2022-09-09 NOTE — Progress Notes (Signed)
Daily Progress Note   Patient Name: Jocelyn Bauer       Date: 09/09/2022 DOB: 19-Jul-1928  Age: 89 y.o. MRN#: [id number] Attending Physician: Jonah Blue, MD Primary Care Physician: Rodrigo Ran, MD Admit Date: 09/03/2022  Reason for Consultation/Follow-up: Establishing goals of care  Patient Profile/HPI:   89 y.o. female  with past medical history of chronic osteomyelitis R lower extremity, rheumatoid arthritis, anxiety, HTN, HLD admitted on 09/03/2022 with aspiration pneumonia, anemia with hemoccult positive. Palliative medicine consulted for  goals of care.   Subjective:  Chart reviewed including labs, progress notes, imaging from this and previous encounters.   Patient's daughter [name] at bedside. Tells me patient is MUCH better today. Patient agrees that she feels much better, although she does feel very weak.  On eval patient awake and alert, sitting up in chair. Able to assist me in repositioning herself. Per PT she ambulated a few steps, but had desaturation with activity.  [name] continues to endorse current plan of care with hopes of d/c to SNF for rehab.   Review of Systems  Constitutional:  Positive for malaise/fatigue.     Physical Exam Vitals and nursing note reviewed.  Constitutional:      Appearance: She is not ill-appearing.  Pulmonary:     Effort: Pulmonary effort is normal.  Skin:    General: Skin is warm and dry.  Neurological:     Mental Status: She is alert.             Vital Signs: BP 122/63 (BP Location: Left Arm)   Pulse 78   Temp 97.7 F (36.5 C) (Oral)   Resp 20   Ht 5\' 2"  (1.575 m)   Wt 60.8 kg   SpO2 (!) 81%   BMI 24.52 kg/m  SpO2: SpO2: (!) 81 % O2 Device: O2 Device: Nasal Cannula O2 Flow Rate: O2 Flow Rate (L/min): 4 L/min  Intake/output  summary:  Intake/Output Summary (Last 24 hours) at 09/09/2022 1217 Last data filed at 09/09/2022 1128 Gross per 24 hour  Intake 703 ml  Output 1450 ml  Net -747 ml   LBM: Last BM Date : 09/09/22 Baseline Weight: Weight: 60.8 kg Most recent weight: Weight: 60.8 kg       Palliative Assessment/Data: PPS: 30%      Patient Active Problem List  Diagnosis Date Noted   Ambulatory dysfunction 09/04/2022   Multifocal pneumonia 09/03/2022   Delirium 09/03/2022   Occult blood in stools 09/03/2022   Lung nodule 09/03/2022   Cellulitis of right lower extremity 05/26/2022   Symptomatic anemia 05/26/2022   Shortness of breath 07/26/2021   Pneumonia due to infectious organism 06/28/2021   Palliative care encounter 06/25/2021   RA (rheumatoid arthritis) (HCC) 03/17/2021   DNR (do not resuscitate) 03/17/2021   Infected hardware in right leg (HCC)    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)    Contusion of left elbow 06/14/2020   Closed trimalleolar fracture of right ankle    Chronic, continuous use of opioids 07/28/2019   Right elbow pain 07/14/2015   Presence of right artificial elbow joint 07/13/2015   Hypertension    Hypothyroidism    Hyperlipidemia    GERD (gastroesophageal reflux disease)    Anxiety    Osteoporosis    Chronic neck and back pain    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    Palliative Care Assessment & Plan    Assessment/Recommendations/Plan  Continue current plan of care- if patient decompensates then family is open to hospice/comfort measures- currently their GOC is for patient to D/C to [facility] and continue life prolonging care as long as she is able to be awake and interact with family and her caregivers at [facility].- Today patient appears to be improving Refer for outpatient Palliative to see at SNF MOST form completed and placed on chart- see selections above   Code Status: DNR  Prognosis:  Unable to  determine  Discharge Planning: Skilled Nursing Facility for rehab with Palliative care service follow-up  Care plan was discussed with family  Thank you for allowing the Palliative Medicine Team to assist in the care of this patient.  Total time: 45 minutes      Greater than 50%  of this time was spent counseling and coordinating care related to the above assessment and plan.  Ocie Bob, AGNP-C Palliative Medicine   Please contact Palliative Medicine Team phone at [phone] for questions and concerns.

## 2022-09-09 NOTE — TOC Progression Note (Addendum)
Transition of Care [facility]) - Progression Note    Patient Details  Name: LE-ANNE BOLUS MRN: [id number] Date of Birth: Mar 24, 1928  Transition of [facility]) CM/SW Contact  Howell Rucks, RN Phone Number: 09/09/2022, 10:50 AM  Clinical Narrative:  Teams chat received from attending, pt medically stable for dc today. Call to [facility] main facility number, transferred to vm of Hazel Sams, left vm requesting call back for bed availably.   -12:43pm Call received from [place] with [facility], confirmed bed available for transfer today, RM 35, nurse call reports [phone], team notified. Call to pt's dtr Dondra Spry), notified of transfer today.     Expected Discharge Plan: Skilled Nursing Facility Barriers to Discharge: Continued Medical Work up  Expected Discharge Plan and Services       Living arrangements for the past 2 months: Single Family Home                                       Social Determinants of Health (SDOH) Interventions SDOH Screenings   Food Insecurity: No Food Insecurity (09/03/2022)  Housing: Low Risk  (09/03/2022)  Transportation Needs: No Transportation Needs (09/03/2022)  Utilities: Not At Risk (09/03/2022)  Depression (PHQ2-9): Low Risk  (02/05/2021)  Tobacco Use: Medium Risk (09/03/2022)    Readmission Risk Interventions    09/04/2022   11:50 AM  Readmission Risk Prevention Plan  Transportation Screening Complete  PCP or Specialist Appt within 3-5 Days Complete  HRI or Home Care Consult Complete  Social Work Consult for Recovery Care Planning/Counseling Complete  Palliative Care Screening Not Applicable  Medication Review [employer]) Complete

## 2022-09-09 NOTE — Plan of Care (Signed)
  Problem: Clinical Measurements: Goal: Respiratory complications will improve Outcome: Progressing   Problem: Pain Managment: Goal: General experience of comfort will improve Outcome: Progressing   

## 2022-09-09 NOTE — TOC Transition Note (Signed)
Transition of Care [facility]) - CM/SW Discharge Note   Patient Details  Name: Jocelyn Bauer MRN: [id number] Date of Birth: [birth year]  Transition [facility] Care [facility]) CM/SW Contact:  Howell Rucks, RN Phone Number: 09/09/2022, 12:45 PM   Clinical Narrative:  DC to [facility] today. [facility] for transport. Pt's dtr , Dondra Spry, notified. No further TOC needs identified.     Final next level of care: Skilled Nursing Facility Barriers to Discharge: Barriers Resolved   Patient Goals and CMS Choice CMS Medicare.gov Compare Post Acute Care list provided to:: Patient Represenative (must comment) Jolyn Nap (Daughter)  [phone] [facility] Phone) Choice offered to / list presented to : Adult Children Jolyn Nap (Daughter)  [phone] (Home Phone)  Discharge Placement                Patient chooses bed at: [facility] Patient to be transferred to facility by: [facility] Name of family member notified: Jolyn Nap (Daughter)  [phone] (Home Phone Patient and family notified of of transfer: 09/09/22  Discharge Plan and Services Additional resources added to the After Visit Summary for                                       Social Determinants of Health (SDOH) Interventions SDOH Screenings   Food Insecurity: No Food Insecurity (09/03/2022)  Housing: Low Risk  (09/03/2022)  Transportation Needs: No Transportation Needs (09/03/2022)  Utilities: Not At Risk (09/03/2022)  Depression (PHQ2-9): Low Risk  (02/05/2021)  Tobacco Use: Medium Risk (09/03/2022)     Readmission Risk Interventions    09/04/2022   11:50 AM  Readmission Risk Prevention Plan  Transportation Screening Complete  PCP or Specialist Appt within 3-5 Days Complete  HRI or Home Care Consult Complete  Social Work Consult for Recovery Care Planning/Counseling Complete  Palliative Care Screening Not Applicable  Medication Review [employer]) Complete

## 2022-09-10 ENCOUNTER — Emergency Department (HOSPITAL_COMMUNITY)
Admission: EM | Admit: 2022-09-10 | Discharge: 2022-09-10 | Disposition: A | Discharge status: Skilled Nursing Facility | Payer: Medicare Other | Attending: Emergency Medicine | Admitting: Emergency Medicine

## 2022-09-10 ENCOUNTER — Encounter (HOSPITAL_COMMUNITY): Payer: Self-pay | Admitting: Emergency Medicine

## 2022-09-10 ENCOUNTER — Emergency Department (HOSPITAL_COMMUNITY): Payer: Medicare Other

## 2022-09-10 ENCOUNTER — Other Ambulatory Visit: Payer: Self-pay

## 2022-09-10 DIAGNOSIS — D72829 Elevated white blood cell count, unspecified: Secondary | ICD-10-CM | POA: Insufficient documentation

## 2022-09-10 DIAGNOSIS — J189 Pneumonia, unspecified organism: Secondary | ICD-10-CM | POA: Diagnosis not present

## 2022-09-10 DIAGNOSIS — R0602 Shortness of breath: Secondary | ICD-10-CM | POA: Diagnosis not present

## 2022-09-10 DIAGNOSIS — J9600 Acute respiratory failure, unspecified whether with hypoxia or hypercapnia: Secondary | ICD-10-CM | POA: Diagnosis not present

## 2022-09-10 DIAGNOSIS — R918 Other nonspecific abnormal finding of lung field: Secondary | ICD-10-CM | POA: Diagnosis not present

## 2022-09-10 DIAGNOSIS — Z1152 Encounter for screening for COVID-19: Secondary | ICD-10-CM | POA: Insufficient documentation

## 2022-09-10 DIAGNOSIS — J984 Other disorders of lung: Secondary | ICD-10-CM | POA: Diagnosis not present

## 2022-09-10 DIAGNOSIS — R531 Weakness: Secondary | ICD-10-CM | POA: Diagnosis not present

## 2022-09-10 LAB — CBC WITH DIFFERENTIAL/PLATELET
Abs Immature Granulocytes: 0.82 10*3/uL — ABNORMAL HIGH (ref 0.00–0.07)
Basophils Absolute: 0.1 10*3/uL (ref 0.0–0.1)
Basophils Relative: 1 %
Eosinophils Absolute: 1.1 10*3/uL — ABNORMAL HIGH (ref 0.0–0.5)
Eosinophils Relative: 8 %
HCT: 30.4 % — ABNORMAL LOW (ref 36.0–46.0)
Hemoglobin: 9.2 g/dL — ABNORMAL LOW (ref 12.0–15.0)
Immature Granulocytes: 6 %
Lymphocytes Relative: 10 %
Lymphs Abs: 1.3 10*3/uL (ref 0.7–4.0)
MCH: 27.6 pg (ref 26.0–34.0)
MCHC: 30.3 g/dL (ref 30.0–36.0)
MCV: 91.3 fL (ref 80.0–100.0)
Monocytes Absolute: 1.3 10*3/uL — ABNORMAL HIGH (ref 0.1–1.0)
Monocytes Relative: 10 %
Neutro Abs: 8.5 10*3/uL — ABNORMAL HIGH (ref 1.7–7.7)
Neutrophils Relative %: 65 %
Platelets: 696 10*3/uL — ABNORMAL HIGH (ref 150–400)
RBC: 3.33 MIL/uL — ABNORMAL LOW (ref 3.87–5.11)
RDW: 19.6 % — ABNORMAL HIGH (ref 11.5–15.5)
WBC: 13.1 10*3/uL — ABNORMAL HIGH (ref 4.0–10.5)
nRBC: 0 % (ref 0.0–0.2)

## 2022-09-10 LAB — BASIC METABOLIC PANEL
Anion gap: 11 (ref 5–15)
BUN: 7 mg/dL — ABNORMAL LOW (ref 8–23)
CO2: 26 mmol/L (ref 22–32)
Calcium: 8.2 mg/dL — ABNORMAL LOW (ref 8.9–10.3)
Chloride: 99 mmol/L (ref 98–111)
Creatinine, Ser: 0.72 mg/dL (ref 0.44–1.00)
GFR, Estimated: >60 mL/min (ref >60)
Glucose, Bld: 119 mg/dL — ABNORMAL HIGH (ref 70–99)
Potassium: 3.2 mmol/L — ABNORMAL LOW (ref 3.5–5.1)
Sodium: 136 mmol/L (ref 135–145)

## 2022-09-10 LAB — SARS CORONAVIRUS 2 BY RT PCR: SARS Coronavirus 2 by RT PCR: NEGATIVE

## 2022-09-10 MED ORDER — POTASSIUM CHLORIDE 20 MEQ PO PACK
40.0000 meq | PACK | Freq: Once | ORAL | Status: AC
  Administered 2022-09-10: 40 meq via ORAL
  Filled 2022-09-10: qty 2

## 2022-09-10 MED ORDER — LEVOFLOXACIN IN D5W 250 MG/50ML IV SOLN: 250.0000 mg | INTRAVENOUS | Status: DC

## 2022-09-10 MED ORDER — LEVOFLOXACIN IN D5W 750 MG/150ML IV SOLN
750.0000 mg | Freq: Once | INTRAVENOUS | Status: AC
  Administered 2022-09-10: 750 mg via INTRAVENOUS
  Filled 2022-09-10: qty 150

## 2022-09-10 MED ORDER — LEVOFLOXACIN 500 MG PO TABS: 500.0000 mg | ORAL_TABLET | Freq: Every day | ORAL | 0 refills | Status: DC

## 2022-09-10 MED ORDER — LORAZEPAM 0.5 MG PO TABS
0.5000 mg | ORAL_TABLET | Freq: Once | ORAL | Status: AC
  Administered 2022-09-10: 0.5 mg via ORAL
  Filled 2022-09-10: qty 1

## 2022-09-10 MED ORDER — LEVOFLOXACIN 500 MG PO TABS: 500.0000 mg | ORAL_TABLET | Freq: Every day | ORAL | 0 refills | Status: AC

## 2022-09-10 NOTE — ED Triage Notes (Signed)
Pt BIB EMS from [facility], c/o increased weakness and lethargy that started today. Per EMS, pt just recently treated for PNA, finished ABT course. 4 liters O2 at baseline. Increased to 6 liters, increased to 10 liters nonrebreather. Rhonchi bilateral breath sounds. Foley cath noted.   BP- 121/67 P 88 RR 18 SpO2 99% 10 liters CBG 168

## 2022-09-10 NOTE — ED Provider Notes (Signed)
[facility] AT [facility] Provider Note   CSN: [id number] Arrival date & time: 09/10/22  1514     History Chief Complaint  Patient presents with   Weakness   Shortness of Breath    HPI Jocelyn Bauer is a 89 y.o. female presenting for desaturation event overnight. She is a 89 year old female recent admission for multifocal pneumonia and discharged on DNR Limited scope of care with plan for palliative care outpatient follow-up. Unfortunately she has multifocal pneumonia for the past month with acute hypoxic respiratory failure not responding to antibiotic therapy.  Also has underlying cognitive disease, RA, chronic pain.  Per family, overnight she had an event where her oxygen decreased to the 70s.  She was asymptomatic and responded to a breathing treatment at her facility but was brought in today for secondary opinion.  Notably she was just discharged from this multifocal pneumonia admission yesterday after completing antibiotic course. Patient is stating that she does not want to be admitted back to the hospital wants to be discharged back to her facility as soon as possible.  Per Daughter: Just discharged yesterday for a PNA admission.  O2 decreased to 70's   Patient's recorded medical, surgical, social, medication list and allergies were reviewed in the Snapshot window as part of the initial history.   Review of Systems   Review of Systems  Constitutional:  Positive for fatigue. Negative for chills and fever.  HENT:  Negative for ear pain and sore throat.   Eyes:  Negative for pain and visual disturbance.  Respiratory:  Positive for cough and shortness of breath.   Cardiovascular:  Negative for chest pain and palpitations.  Gastrointestinal:  Negative for abdominal pain and vomiting.  Genitourinary:  Negative for dysuria and hematuria.  Musculoskeletal:  Negative for arthralgias and back pain.  Skin:  Negative for color change and rash.   Neurological:  Negative for seizures and syncope.  All other systems reviewed and are negative.   Physical Exam Updated Vital Signs There were no vitals taken for this visit. Physical Exam Vitals and nursing note reviewed.  Constitutional:      General: She is not in acute distress.    Appearance: She is well-developed.  HENT:     Head: Normocephalic and atraumatic.  Eyes:     Conjunctiva/sclera: Conjunctivae normal.  Cardiovascular:     Rate and Rhythm: Normal rate and regular rhythm.     Heart sounds: No murmur heard. Pulmonary:     Effort: Pulmonary effort is normal. Tachypnea present. No respiratory distress.     Breath sounds: Rhonchi present.  Abdominal:     General: There is no distension.     Palpations: Abdomen is soft.     Tenderness: There is no abdominal tenderness. There is no right CVA tenderness or left CVA tenderness.  Musculoskeletal:        General: No swelling or tenderness. Normal range of motion.     Cervical back: Neck supple.  Skin:    General: Skin is warm and dry.  Neurological:     General: No focal deficit present.     Mental Status: She is alert and oriented to person, place, and time. Mental status is at baseline.     Cranial Nerves: No cranial nerve deficit.      ED Course/ Medical Decision Making/ A&P Clinical Course as of 09/10/22 1951  Tue Sep 10, 2022  1933 White count heading back up again Not improved.  Will  treat with Levaquin.  Discussed with family at bedside.  They still want outpatient care management.  They have requested hospice resources. [CC]    Clinical Course User Index [CC] Glyn Ade, MD    Procedures Procedures   Medications Ordered in ED Medications  levofloxacin (LEVAQUIN) IVPB 750 mg (750 mg Intravenous New Bag/Given 09/10/22 1902)  potassium chloride (KLOR-CON) packet 40 mEq (has no administration in time range)    Medical Decision Making:    [name] is a 89 y.o. female who presented to  the ED today with ongoing shortness of breath detailed above.     Complete initial physical exam performed, notably the patient  was hemodynamically stable no acute distress.  Satting 98% on 4 L nasal cannula.      Reviewed and confirmed nursing documentation for past medical history, family history, social history.    Initial Assessment:   With the patient's presentation of shortness of breath, most likely diagnosis is ongoing pneumonia. Other diagnoses were considered including (but not limited to) pulmonary embolism, ACS, pneumothorax, pulmonary embolism. These are considered less likely due to history of present illness and physical exam findings.   This is most consistent with an acute life/limb threatening illness complicated by underlying chronic conditions.  Initial Plan:  Screening labs including CBC and Metabolic panel to evaluate for infectious or metabolic etiology of disease.  CXR to evaluate for structural/infectious intrathoracic pathology.  EKG to evaluate for cardiac pathology. Objective evaluation as below reviewed with plan for close reassessment  Initial Study Results:   Laboratory  All laboratory results reviewed without evidence of clinically relevant pathology.   Exceptions include: White count trending back up to 13, mild hypokalemia  Radiology  All images reviewed independently. Agree with radiology report at this time.   DG Chest Portable 1 View  Result Date: 09/10/2022 CLINICAL DATA:  Shortness of breath with increased weakness and lethargy. EXAM: PORTABLE CHEST 1 VIEW COMPARISON:  Radiographs 09/07/2022, 09/03/2022 and 05/26/2022. CT 03/31/2014. FINDINGS: 1622 hours. The heart size and mediastinal contours are stable with aortic atherosclerosis. Recently demonstrated superimposed right upper lobe airspace disease in volume loss have not significantly changed. There are additional scattered interstitial opacities which may be chronic. No definite edema, significant  pleural effusion or pneumothorax. No acute osseous findings are evident. Previous left humeral ORIF. Telemetry leads overlie the chest. IMPRESSION: No significant change from recent prior studies. Persistent asymmetric right upper lobe airspace disease compatible with pneumonia. Electronically Signed   By: Carey Bullocks M.D.   On: 09/10/2022 18:30     Reassessment and Plan:   Long conversation at bedside with family. She appeared to be improving on her antibiotic therapy and is now having recurrent shortness of breath. X-ray shows continued consolidations and white count trended back up.  Will broaden her therapy to Levaquin given failure of beta-lactam's. Discussed admission versus observational care.  Family stated that the patient does not want to be admitted back to the hospital due to discomfort and is requesting outpatient care and management.  Will continue patient on Levaquin referred to hospice in the outpatient setting for multifocal pneumonia with deterioration. Disposition:  I have considered need for hospitalization, however, considering all of the above, I believe this patient is stable for discharge at this time.  Patient/family educated about specific return precautions for given chief complaint and symptoms.  Patient/family educated about follow-up with PCP.     Patient/family expressed understanding of return precautions and need for follow-up. Patient spoken  to regarding all imaging and laboratory results and appropriate follow up for these results. All education provided in verbal form with additional information in written form. Time was allowed for answering of patient questions. Patient discharged.    Emergency Department Medication Summary:   Medications  levofloxacin (LEVAQUIN) IVPB 750 mg (750 mg Intravenous New Bag/Given 09/10/22 1902)  potassium chloride (KLOR-CON) packet 40 mEq (has no administration in time range)     Clinical Impression:  1. SOB (shortness of  breath)      Data Unavailable   Final Clinical Impression(s) / ED Diagnoses Final diagnoses:  SOB (shortness of breath)    Rx / DC Orders ED Discharge Orders          Ordered    levofloxacin (LEVAQUIN) 500 MG tablet  Daily        09/10/22 1951              Glyn Ade, MD 09/10/22 2228

## 2022-09-10 NOTE — Social Work (Signed)
This CSW spoke to patient's daughter about setting up a referral at this time CSW has sent [facility] the referral to follow up with the patient;s daughter. Patient will return to [facility].

## 2022-09-11 DIAGNOSIS — D500 Iron deficiency anemia secondary to blood loss (chronic): Secondary | ICD-10-CM | POA: Diagnosis not present

## 2022-09-11 DIAGNOSIS — E039 Hypothyroidism, unspecified: Secondary | ICD-10-CM | POA: Diagnosis not present

## 2022-09-11 DIAGNOSIS — R911 Solitary pulmonary nodule: Secondary | ICD-10-CM | POA: Diagnosis not present

## 2022-09-11 DIAGNOSIS — M069 Rheumatoid arthritis, unspecified: Secondary | ICD-10-CM | POA: Diagnosis not present

## 2022-09-11 DIAGNOSIS — J189 Pneumonia, unspecified organism: Secondary | ICD-10-CM | POA: Diagnosis not present

## 2022-09-11 DIAGNOSIS — K219 Gastro-esophageal reflux disease without esophagitis: Secondary | ICD-10-CM | POA: Diagnosis not present

## 2022-09-11 DIAGNOSIS — F32A Depression, unspecified: Secondary | ICD-10-CM | POA: Diagnosis not present

## 2022-09-11 DIAGNOSIS — G894 Chronic pain syndrome: Secondary | ICD-10-CM | POA: Diagnosis not present

## 2022-09-11 DIAGNOSIS — I10 Essential (primary) hypertension: Secondary | ICD-10-CM | POA: Diagnosis not present

## 2022-09-11 DIAGNOSIS — R5381 Other malaise: Secondary | ICD-10-CM | POA: Diagnosis not present

## 2022-09-11 LAB — PROTEIN ELECTROPHORESIS, SERUM
A/G Ratio: 0.8 (ref 0.7–1.7)
Albumin ELP: 2.2 g/dL — ABNORMAL LOW (ref 2.9–4.4)
Alpha-1-Globulin: 0.4 g/dL (ref 0.0–0.4)
Alpha-2-Globulin: 1 g/dL (ref 0.4–1.0)
Beta Globulin: 1 g/dL (ref 0.7–1.3)
Gamma Globulin: 0.6 g/dL (ref 0.4–1.8)
Globulin, Total: 2.9 g/dL (ref 2.2–3.9)
Total Protein ELP: 5.1 g/dL — ABNORMAL LOW (ref 6.0–8.5)

## 2022-09-12 DIAGNOSIS — F32A Depression, unspecified: Secondary | ICD-10-CM | POA: Diagnosis not present

## 2022-09-12 DIAGNOSIS — J189 Pneumonia, unspecified organism: Secondary | ICD-10-CM | POA: Diagnosis not present

## 2022-09-12 DIAGNOSIS — G894 Chronic pain syndrome: Secondary | ICD-10-CM | POA: Diagnosis not present

## 2022-09-12 DIAGNOSIS — R911 Solitary pulmonary nodule: Secondary | ICD-10-CM | POA: Diagnosis not present

## 2022-09-12 DIAGNOSIS — J9611 Chronic respiratory failure with hypoxia: Secondary | ICD-10-CM | POA: Diagnosis not present

## 2022-09-12 DIAGNOSIS — D500 Iron deficiency anemia secondary to blood loss (chronic): Secondary | ICD-10-CM | POA: Diagnosis not present

## 2022-09-12 DIAGNOSIS — R5381 Other malaise: Secondary | ICD-10-CM | POA: Diagnosis not present

## 2022-09-12 DIAGNOSIS — I10 Essential (primary) hypertension: Secondary | ICD-10-CM | POA: Diagnosis not present

## 2022-09-12 DIAGNOSIS — E559 Vitamin D deficiency, unspecified: Secondary | ICD-10-CM | POA: Diagnosis not present

## 2022-09-12 DIAGNOSIS — E039 Hypothyroidism, unspecified: Secondary | ICD-10-CM | POA: Diagnosis not present

## 2022-09-12 DIAGNOSIS — K219 Gastro-esophageal reflux disease without esophagitis: Secondary | ICD-10-CM | POA: Diagnosis not present

## 2022-09-12 DIAGNOSIS — M069 Rheumatoid arthritis, unspecified: Secondary | ICD-10-CM | POA: Diagnosis not present

## 2022-09-15 LAB — CULTURE, BLOOD (ROUTINE X 2)
Culture: NO GROWTH
Culture: NO GROWTH

## 2022-09-18 DIAGNOSIS — B370 Candidal stomatitis: Secondary | ICD-10-CM | POA: Diagnosis not present

## 2022-09-23 DIAGNOSIS — J189 Pneumonia, unspecified organism: Secondary | ICD-10-CM | POA: Diagnosis not present

## 2022-09-23 DIAGNOSIS — M069 Rheumatoid arthritis, unspecified: Secondary | ICD-10-CM | POA: Diagnosis not present

## 2022-09-23 DIAGNOSIS — K219 Gastro-esophageal reflux disease without esophagitis: Secondary | ICD-10-CM | POA: Diagnosis not present

## 2022-09-23 DIAGNOSIS — J9611 Chronic respiratory failure with hypoxia: Secondary | ICD-10-CM | POA: Diagnosis not present

## 2022-09-23 DIAGNOSIS — F32A Depression, unspecified: Secondary | ICD-10-CM | POA: Diagnosis not present

## 2022-09-23 DIAGNOSIS — E039 Hypothyroidism, unspecified: Secondary | ICD-10-CM | POA: Diagnosis not present

## 2022-09-23 DIAGNOSIS — D500 Iron deficiency anemia secondary to blood loss (chronic): Secondary | ICD-10-CM | POA: Diagnosis not present

## 2022-09-23 DIAGNOSIS — R911 Solitary pulmonary nodule: Secondary | ICD-10-CM | POA: Diagnosis not present

## 2022-09-23 DIAGNOSIS — G894 Chronic pain syndrome: Secondary | ICD-10-CM | POA: Diagnosis not present

## 2022-09-23 DIAGNOSIS — I10 Essential (primary) hypertension: Secondary | ICD-10-CM | POA: Diagnosis not present

## 2022-09-23 DIAGNOSIS — R5381 Other malaise: Secondary | ICD-10-CM | POA: Diagnosis not present

## 2022-09-23 DIAGNOSIS — E559 Vitamin D deficiency, unspecified: Secondary | ICD-10-CM | POA: Diagnosis not present

## 2022-09-27 ENCOUNTER — Telehealth: Payer: Self-pay

## 2022-09-27 NOTE — Telephone Encounter (Signed)
Transition Care Management Unsuccessful Follow-up Telephone Call  Date of discharge and from where:  [facility] 9/3  Attempts:  1st Attempt  Reason for unsuccessful TCM follow-up call:  No answer/busy   Lenard Forth [place]  [facility], [facility] Guide, Phone: [phone] Website: [email or web address]

## 2022-09-30 ENCOUNTER — Telehealth: Payer: Self-pay

## 2022-09-30 NOTE — Telephone Encounter (Signed)
Transition Care Management Unsuccessful Follow-up Telephone Call  Date of discharge and from where:  Joc[facility] 9/3  Attempts:  2nd Attempt  Reason for unsuccessful TCM follow-up call:  No answer/busy   Jocelyn Bauer [place]  [facility], [facility] Guide, Phone: [phone] Website: [email or web address]

## 2022-10-02 ENCOUNTER — Institutional Professional Consult (permissible substitution): Payer: BLUE CROSS/BLUE SHIELD | Admitting: Emergency Medicine

## 2022-10-09 DIAGNOSIS — M069 Rheumatoid arthritis, unspecified: Secondary | ICD-10-CM | POA: Diagnosis not present

## 2022-10-09 DIAGNOSIS — I10 Essential (primary) hypertension: Secondary | ICD-10-CM | POA: Diagnosis not present

## 2022-10-09 DIAGNOSIS — G894 Chronic pain syndrome: Secondary | ICD-10-CM | POA: Diagnosis not present

## 2022-10-09 DIAGNOSIS — R0989 Other specified symptoms and signs involving the circulatory and respiratory systems: Secondary | ICD-10-CM | POA: Diagnosis not present

## 2022-10-09 DIAGNOSIS — E039 Hypothyroidism, unspecified: Secondary | ICD-10-CM | POA: Diagnosis not present

## 2022-10-09 DIAGNOSIS — R600 Localized edema: Secondary | ICD-10-CM | POA: Diagnosis not present

## 2022-10-09 DIAGNOSIS — D500 Iron deficiency anemia secondary to blood loss (chronic): Secondary | ICD-10-CM | POA: Diagnosis not present

## 2022-10-09 DIAGNOSIS — R5381 Other malaise: Secondary | ICD-10-CM | POA: Diagnosis not present

## 2022-10-09 DIAGNOSIS — R339 Retention of urine, unspecified: Secondary | ICD-10-CM | POA: Diagnosis not present

## 2022-10-09 DIAGNOSIS — J189 Pneumonia, unspecified organism: Secondary | ICD-10-CM | POA: Diagnosis not present

## 2022-10-09 DIAGNOSIS — F32A Depression, unspecified: Secondary | ICD-10-CM | POA: Diagnosis not present

## 2022-10-09 DIAGNOSIS — K219 Gastro-esophageal reflux disease without esophagitis: Secondary | ICD-10-CM | POA: Diagnosis not present

## 2022-10-10 DIAGNOSIS — E538 Deficiency of other specified B group vitamins: Secondary | ICD-10-CM | POA: Diagnosis not present

## 2022-10-10 DIAGNOSIS — D500 Iron deficiency anemia secondary to blood loss (chronic): Secondary | ICD-10-CM | POA: Diagnosis not present

## 2022-10-10 DIAGNOSIS — J9611 Chronic respiratory failure with hypoxia: Secondary | ICD-10-CM | POA: Diagnosis not present

## 2022-10-10 DIAGNOSIS — K219 Gastro-esophageal reflux disease without esophagitis: Secondary | ICD-10-CM | POA: Diagnosis not present

## 2022-10-10 DIAGNOSIS — R0989 Other specified symptoms and signs involving the circulatory and respiratory systems: Secondary | ICD-10-CM | POA: Diagnosis not present

## 2022-10-10 DIAGNOSIS — E559 Vitamin D deficiency, unspecified: Secondary | ICD-10-CM | POA: Diagnosis not present

## 2022-10-10 DIAGNOSIS — J189 Pneumonia, unspecified organism: Secondary | ICD-10-CM | POA: Diagnosis not present

## 2022-10-10 DIAGNOSIS — R600 Localized edema: Secondary | ICD-10-CM | POA: Diagnosis not present

## 2022-10-10 DIAGNOSIS — R339 Retention of urine, unspecified: Secondary | ICD-10-CM | POA: Diagnosis not present

## 2022-10-10 DIAGNOSIS — R911 Solitary pulmonary nodule: Secondary | ICD-10-CM | POA: Diagnosis not present

## 2022-10-10 DIAGNOSIS — E039 Hypothyroidism, unspecified: Secondary | ICD-10-CM | POA: Diagnosis not present

## 2022-10-25 DIAGNOSIS — F419 Anxiety disorder, unspecified: Secondary | ICD-10-CM | POA: Diagnosis not present

## 2022-10-25 DIAGNOSIS — R911 Solitary pulmonary nodule: Secondary | ICD-10-CM | POA: Diagnosis not present

## 2022-10-25 DIAGNOSIS — I10 Essential (primary) hypertension: Secondary | ICD-10-CM | POA: Diagnosis not present

## 2022-10-25 DIAGNOSIS — R5381 Other malaise: Secondary | ICD-10-CM | POA: Diagnosis not present

## 2022-10-25 DIAGNOSIS — E039 Hypothyroidism, unspecified: Secondary | ICD-10-CM | POA: Diagnosis not present

## 2022-10-25 DIAGNOSIS — F32A Depression, unspecified: Secondary | ICD-10-CM | POA: Diagnosis not present

## 2022-10-25 DIAGNOSIS — M069 Rheumatoid arthritis, unspecified: Secondary | ICD-10-CM | POA: Diagnosis not present

## 2022-10-25 DIAGNOSIS — G894 Chronic pain syndrome: Secondary | ICD-10-CM | POA: Diagnosis not present

## 2022-10-25 DIAGNOSIS — N390 Urinary tract infection, site not specified: Secondary | ICD-10-CM | POA: Diagnosis not present

## 2022-10-25 DIAGNOSIS — R600 Localized edema: Secondary | ICD-10-CM | POA: Diagnosis not present

## 2022-10-25 DIAGNOSIS — D500 Iron deficiency anemia secondary to blood loss (chronic): Secondary | ICD-10-CM | POA: Diagnosis not present

## 2022-10-25 DIAGNOSIS — R339 Retention of urine, unspecified: Secondary | ICD-10-CM | POA: Diagnosis not present

## 2022-10-31 DIAGNOSIS — K5900 Constipation, unspecified: Secondary | ICD-10-CM | POA: Diagnosis not present

## 2022-10-31 DIAGNOSIS — R109 Unspecified abdominal pain: Secondary | ICD-10-CM | POA: Diagnosis not present

## 2022-11-01 DIAGNOSIS — R319 Hematuria, unspecified: Secondary | ICD-10-CM | POA: Diagnosis not present

## 2022-11-06 DIAGNOSIS — E559 Vitamin D deficiency, unspecified: Secondary | ICD-10-CM | POA: Diagnosis not present

## 2022-11-06 DIAGNOSIS — K219 Gastro-esophageal reflux disease without esophagitis: Secondary | ICD-10-CM | POA: Diagnosis not present

## 2022-11-06 DIAGNOSIS — F32A Depression, unspecified: Secondary | ICD-10-CM | POA: Diagnosis not present

## 2022-11-06 DIAGNOSIS — M069 Rheumatoid arthritis, unspecified: Secondary | ICD-10-CM | POA: Diagnosis not present

## 2022-11-06 DIAGNOSIS — G894 Chronic pain syndrome: Secondary | ICD-10-CM | POA: Diagnosis not present

## 2022-11-06 DIAGNOSIS — J9611 Chronic respiratory failure with hypoxia: Secondary | ICD-10-CM | POA: Diagnosis not present

## 2022-11-06 DIAGNOSIS — R600 Localized edema: Secondary | ICD-10-CM | POA: Diagnosis not present

## 2022-11-06 DIAGNOSIS — I10 Essential (primary) hypertension: Secondary | ICD-10-CM | POA: Diagnosis not present

## 2022-11-06 DIAGNOSIS — F419 Anxiety disorder, unspecified: Secondary | ICD-10-CM | POA: Diagnosis not present

## 2022-11-06 DIAGNOSIS — E039 Hypothyroidism, unspecified: Secondary | ICD-10-CM | POA: Diagnosis not present

## 2022-11-06 DIAGNOSIS — D500 Iron deficiency anemia secondary to blood loss (chronic): Secondary | ICD-10-CM | POA: Diagnosis not present

## 2022-11-06 DIAGNOSIS — R911 Solitary pulmonary nodule: Secondary | ICD-10-CM | POA: Diagnosis not present

## 2022-11-07 DIAGNOSIS — D500 Iron deficiency anemia secondary to blood loss (chronic): Secondary | ICD-10-CM | POA: Diagnosis not present

## 2022-11-07 DIAGNOSIS — K219 Gastro-esophageal reflux disease without esophagitis: Secondary | ICD-10-CM | POA: Diagnosis not present

## 2022-11-07 DIAGNOSIS — J9611 Chronic respiratory failure with hypoxia: Secondary | ICD-10-CM | POA: Diagnosis not present

## 2022-11-07 DIAGNOSIS — I10 Essential (primary) hypertension: Secondary | ICD-10-CM | POA: Diagnosis not present

## 2022-11-07 DIAGNOSIS — R600 Localized edema: Secondary | ICD-10-CM | POA: Diagnosis not present

## 2022-11-07 DIAGNOSIS — R339 Retention of urine, unspecified: Secondary | ICD-10-CM | POA: Diagnosis not present

## 2022-11-07 DIAGNOSIS — R911 Solitary pulmonary nodule: Secondary | ICD-10-CM | POA: Diagnosis not present

## 2022-11-07 DIAGNOSIS — E039 Hypothyroidism, unspecified: Secondary | ICD-10-CM | POA: Diagnosis not present

## 2022-11-07 DIAGNOSIS — M069 Rheumatoid arthritis, unspecified: Secondary | ICD-10-CM | POA: Diagnosis not present

## 2022-11-07 DIAGNOSIS — E559 Vitamin D deficiency, unspecified: Secondary | ICD-10-CM | POA: Diagnosis not present

## 2022-11-07 DIAGNOSIS — E538 Deficiency of other specified B group vitamins: Secondary | ICD-10-CM | POA: Diagnosis not present

## 2022-11-07 DIAGNOSIS — G894 Chronic pain syndrome: Secondary | ICD-10-CM | POA: Diagnosis not present

## 2022-11-11 DIAGNOSIS — J188 Other pneumonia, unspecified organism: Secondary | ICD-10-CM | POA: Diagnosis not present

## 2022-11-11 DIAGNOSIS — R2681 Unsteadiness on feet: Secondary | ICD-10-CM | POA: Diagnosis not present

## 2022-11-12 DIAGNOSIS — J188 Other pneumonia, unspecified organism: Secondary | ICD-10-CM | POA: Diagnosis not present

## 2022-11-12 DIAGNOSIS — R2681 Unsteadiness on feet: Secondary | ICD-10-CM | POA: Diagnosis not present

## 2022-11-13 DIAGNOSIS — J188 Other pneumonia, unspecified organism: Secondary | ICD-10-CM | POA: Diagnosis not present

## 2022-11-13 DIAGNOSIS — R2681 Unsteadiness on feet: Secondary | ICD-10-CM | POA: Diagnosis not present

## 2022-11-14 DIAGNOSIS — J188 Other pneumonia, unspecified organism: Secondary | ICD-10-CM | POA: Diagnosis not present

## 2022-11-14 DIAGNOSIS — R2681 Unsteadiness on feet: Secondary | ICD-10-CM | POA: Diagnosis not present

## 2022-11-15 DIAGNOSIS — M810 Age-related osteoporosis without current pathological fracture: Secondary | ICD-10-CM | POA: Diagnosis not present

## 2022-11-15 DIAGNOSIS — I10 Essential (primary) hypertension: Secondary | ICD-10-CM | POA: Diagnosis not present

## 2022-11-15 DIAGNOSIS — M069 Rheumatoid arthritis, unspecified: Secondary | ICD-10-CM | POA: Diagnosis not present

## 2022-11-15 DIAGNOSIS — J188 Other pneumonia, unspecified organism: Secondary | ICD-10-CM | POA: Diagnosis not present

## 2022-11-15 DIAGNOSIS — E039 Hypothyroidism, unspecified: Secondary | ICD-10-CM | POA: Diagnosis not present

## 2022-11-15 DIAGNOSIS — E559 Vitamin D deficiency, unspecified: Secondary | ICD-10-CM | POA: Diagnosis not present

## 2022-11-15 DIAGNOSIS — R2681 Unsteadiness on feet: Secondary | ICD-10-CM | POA: Diagnosis not present

## 2022-11-15 DIAGNOSIS — G894 Chronic pain syndrome: Secondary | ICD-10-CM | POA: Diagnosis not present

## 2022-11-15 DIAGNOSIS — F419 Anxiety disorder, unspecified: Secondary | ICD-10-CM | POA: Diagnosis not present

## 2022-11-15 DIAGNOSIS — E785 Hyperlipidemia, unspecified: Secondary | ICD-10-CM | POA: Diagnosis not present

## 2022-11-18 DIAGNOSIS — J188 Other pneumonia, unspecified organism: Secondary | ICD-10-CM | POA: Diagnosis not present

## 2022-11-18 DIAGNOSIS — R2681 Unsteadiness on feet: Secondary | ICD-10-CM | POA: Diagnosis not present

## 2022-11-19 DIAGNOSIS — J188 Other pneumonia, unspecified organism: Secondary | ICD-10-CM | POA: Diagnosis not present

## 2022-11-19 DIAGNOSIS — R2681 Unsteadiness on feet: Secondary | ICD-10-CM | POA: Diagnosis not present

## 2022-11-20 DIAGNOSIS — J188 Other pneumonia, unspecified organism: Secondary | ICD-10-CM | POA: Diagnosis not present

## 2022-11-20 DIAGNOSIS — R2681 Unsteadiness on feet: Secondary | ICD-10-CM | POA: Diagnosis not present

## 2022-11-20 DIAGNOSIS — D649 Anemia, unspecified: Secondary | ICD-10-CM | POA: Diagnosis not present

## 2022-11-21 DIAGNOSIS — J188 Other pneumonia, unspecified organism: Secondary | ICD-10-CM | POA: Diagnosis not present

## 2022-11-21 DIAGNOSIS — R2681 Unsteadiness on feet: Secondary | ICD-10-CM | POA: Diagnosis not present

## 2022-11-22 DIAGNOSIS — R2681 Unsteadiness on feet: Secondary | ICD-10-CM | POA: Diagnosis not present

## 2022-11-22 DIAGNOSIS — J188 Other pneumonia, unspecified organism: Secondary | ICD-10-CM | POA: Diagnosis not present

## 2022-11-25 DIAGNOSIS — R2681 Unsteadiness on feet: Secondary | ICD-10-CM | POA: Diagnosis not present

## 2022-11-25 DIAGNOSIS — J188 Other pneumonia, unspecified organism: Secondary | ICD-10-CM | POA: Diagnosis not present

## 2022-11-26 DIAGNOSIS — R2681 Unsteadiness on feet: Secondary | ICD-10-CM | POA: Diagnosis not present

## 2022-11-26 DIAGNOSIS — J188 Other pneumonia, unspecified organism: Secondary | ICD-10-CM | POA: Diagnosis not present

## 2022-11-27 DIAGNOSIS — J188 Other pneumonia, unspecified organism: Secondary | ICD-10-CM | POA: Diagnosis not present

## 2022-11-27 DIAGNOSIS — R2681 Unsteadiness on feet: Secondary | ICD-10-CM | POA: Diagnosis not present

## 2022-11-28 DIAGNOSIS — R2681 Unsteadiness on feet: Secondary | ICD-10-CM | POA: Diagnosis not present

## 2022-11-28 DIAGNOSIS — J188 Other pneumonia, unspecified organism: Secondary | ICD-10-CM | POA: Diagnosis not present

## 2022-11-29 DIAGNOSIS — R2681 Unsteadiness on feet: Secondary | ICD-10-CM | POA: Diagnosis not present

## 2022-11-29 DIAGNOSIS — J188 Other pneumonia, unspecified organism: Secondary | ICD-10-CM | POA: Diagnosis not present

## 2022-12-01 DIAGNOSIS — R2681 Unsteadiness on feet: Secondary | ICD-10-CM | POA: Diagnosis not present

## 2022-12-01 DIAGNOSIS — J188 Other pneumonia, unspecified organism: Secondary | ICD-10-CM | POA: Diagnosis not present

## 2022-12-02 DIAGNOSIS — J188 Other pneumonia, unspecified organism: Secondary | ICD-10-CM | POA: Diagnosis not present

## 2022-12-02 DIAGNOSIS — R2681 Unsteadiness on feet: Secondary | ICD-10-CM | POA: Diagnosis not present

## 2022-12-03 DIAGNOSIS — R911 Solitary pulmonary nodule: Secondary | ICD-10-CM | POA: Diagnosis not present

## 2022-12-03 DIAGNOSIS — J188 Other pneumonia, unspecified organism: Secondary | ICD-10-CM | POA: Diagnosis not present

## 2022-12-03 DIAGNOSIS — M069 Rheumatoid arthritis, unspecified: Secondary | ICD-10-CM | POA: Diagnosis not present

## 2022-12-03 DIAGNOSIS — R5381 Other malaise: Secondary | ICD-10-CM | POA: Diagnosis not present

## 2022-12-03 DIAGNOSIS — R2681 Unsteadiness on feet: Secondary | ICD-10-CM | POA: Diagnosis not present

## 2022-12-03 DIAGNOSIS — R339 Retention of urine, unspecified: Secondary | ICD-10-CM | POA: Diagnosis not present

## 2022-12-03 DIAGNOSIS — J9611 Chronic respiratory failure with hypoxia: Secondary | ICD-10-CM | POA: Diagnosis not present

## 2022-12-03 DIAGNOSIS — D500 Iron deficiency anemia secondary to blood loss (chronic): Secondary | ICD-10-CM | POA: Diagnosis not present

## 2022-12-03 DIAGNOSIS — G894 Chronic pain syndrome: Secondary | ICD-10-CM | POA: Diagnosis not present

## 2022-12-03 DIAGNOSIS — I10 Essential (primary) hypertension: Secondary | ICD-10-CM | POA: Diagnosis not present

## 2022-12-03 DIAGNOSIS — E039 Hypothyroidism, unspecified: Secondary | ICD-10-CM | POA: Diagnosis not present

## 2022-12-03 DIAGNOSIS — R600 Localized edema: Secondary | ICD-10-CM | POA: Diagnosis not present

## 2022-12-03 DIAGNOSIS — F32A Depression, unspecified: Secondary | ICD-10-CM | POA: Diagnosis not present

## 2022-12-03 DIAGNOSIS — F419 Anxiety disorder, unspecified: Secondary | ICD-10-CM | POA: Diagnosis not present

## 2022-12-04 DIAGNOSIS — J188 Other pneumonia, unspecified organism: Secondary | ICD-10-CM | POA: Diagnosis not present

## 2022-12-04 DIAGNOSIS — R2681 Unsteadiness on feet: Secondary | ICD-10-CM | POA: Diagnosis not present

## 2022-12-05 DIAGNOSIS — J188 Other pneumonia, unspecified organism: Secondary | ICD-10-CM | POA: Diagnosis not present

## 2022-12-05 DIAGNOSIS — R2681 Unsteadiness on feet: Secondary | ICD-10-CM | POA: Diagnosis not present

## 2022-12-06 DIAGNOSIS — J188 Other pneumonia, unspecified organism: Secondary | ICD-10-CM | POA: Diagnosis not present

## 2022-12-06 DIAGNOSIS — R2681 Unsteadiness on feet: Secondary | ICD-10-CM | POA: Diagnosis not present

## 2022-12-09 DIAGNOSIS — R2681 Unsteadiness on feet: Secondary | ICD-10-CM | POA: Diagnosis not present

## 2022-12-09 DIAGNOSIS — J188 Other pneumonia, unspecified organism: Secondary | ICD-10-CM | POA: Diagnosis not present

## 2022-12-10 DIAGNOSIS — R2681 Unsteadiness on feet: Secondary | ICD-10-CM | POA: Diagnosis not present

## 2022-12-10 DIAGNOSIS — E559 Vitamin D deficiency, unspecified: Secondary | ICD-10-CM | POA: Diagnosis not present

## 2022-12-10 DIAGNOSIS — D500 Iron deficiency anemia secondary to blood loss (chronic): Secondary | ICD-10-CM | POA: Diagnosis not present

## 2022-12-10 DIAGNOSIS — E039 Hypothyroidism, unspecified: Secondary | ICD-10-CM | POA: Diagnosis not present

## 2022-12-10 DIAGNOSIS — J188 Other pneumonia, unspecified organism: Secondary | ICD-10-CM | POA: Diagnosis not present

## 2022-12-11 DIAGNOSIS — R300 Dysuria: Secondary | ICD-10-CM | POA: Diagnosis not present

## 2022-12-11 DIAGNOSIS — R600 Localized edema: Secondary | ICD-10-CM | POA: Diagnosis not present

## 2022-12-11 DIAGNOSIS — G894 Chronic pain syndrome: Secondary | ICD-10-CM | POA: Diagnosis not present

## 2022-12-11 DIAGNOSIS — I10 Essential (primary) hypertension: Secondary | ICD-10-CM | POA: Diagnosis not present

## 2022-12-11 DIAGNOSIS — N3289 Other specified disorders of bladder: Secondary | ICD-10-CM | POA: Diagnosis not present

## 2022-12-11 DIAGNOSIS — J188 Other pneumonia, unspecified organism: Secondary | ICD-10-CM | POA: Diagnosis not present

## 2022-12-11 DIAGNOSIS — R2681 Unsteadiness on feet: Secondary | ICD-10-CM | POA: Diagnosis not present

## 2022-12-11 DIAGNOSIS — J9611 Chronic respiratory failure with hypoxia: Secondary | ICD-10-CM | POA: Diagnosis not present

## 2022-12-11 DIAGNOSIS — K219 Gastro-esophageal reflux disease without esophagitis: Secondary | ICD-10-CM | POA: Diagnosis not present

## 2022-12-11 DIAGNOSIS — E039 Hypothyroidism, unspecified: Secondary | ICD-10-CM | POA: Diagnosis not present

## 2022-12-11 DIAGNOSIS — F419 Anxiety disorder, unspecified: Secondary | ICD-10-CM | POA: Diagnosis not present

## 2022-12-11 DIAGNOSIS — E559 Vitamin D deficiency, unspecified: Secondary | ICD-10-CM | POA: Diagnosis not present

## 2022-12-12 DIAGNOSIS — N390 Urinary tract infection, site not specified: Secondary | ICD-10-CM | POA: Diagnosis not present

## 2022-12-12 DIAGNOSIS — R2681 Unsteadiness on feet: Secondary | ICD-10-CM | POA: Diagnosis not present

## 2022-12-12 DIAGNOSIS — J188 Other pneumonia, unspecified organism: Secondary | ICD-10-CM | POA: Diagnosis not present

## 2022-12-13 DIAGNOSIS — R2681 Unsteadiness on feet: Secondary | ICD-10-CM | POA: Diagnosis not present

## 2022-12-13 DIAGNOSIS — J188 Other pneumonia, unspecified organism: Secondary | ICD-10-CM | POA: Diagnosis not present

## 2022-12-16 DIAGNOSIS — I10 Essential (primary) hypertension: Secondary | ICD-10-CM | POA: Diagnosis not present

## 2022-12-16 DIAGNOSIS — E785 Hyperlipidemia, unspecified: Secondary | ICD-10-CM | POA: Diagnosis not present

## 2022-12-16 DIAGNOSIS — J188 Other pneumonia, unspecified organism: Secondary | ICD-10-CM | POA: Diagnosis not present

## 2022-12-16 DIAGNOSIS — M069 Rheumatoid arthritis, unspecified: Secondary | ICD-10-CM | POA: Diagnosis not present

## 2022-12-16 DIAGNOSIS — E039 Hypothyroidism, unspecified: Secondary | ICD-10-CM | POA: Diagnosis not present

## 2022-12-16 DIAGNOSIS — R2681 Unsteadiness on feet: Secondary | ICD-10-CM | POA: Diagnosis not present

## 2022-12-17 DIAGNOSIS — J188 Other pneumonia, unspecified organism: Secondary | ICD-10-CM | POA: Diagnosis not present

## 2022-12-17 DIAGNOSIS — R2681 Unsteadiness on feet: Secondary | ICD-10-CM | POA: Diagnosis not present

## 2022-12-18 DIAGNOSIS — R2681 Unsteadiness on feet: Secondary | ICD-10-CM | POA: Diagnosis not present

## 2022-12-18 DIAGNOSIS — J188 Other pneumonia, unspecified organism: Secondary | ICD-10-CM | POA: Diagnosis not present

## 2022-12-19 DIAGNOSIS — R2681 Unsteadiness on feet: Secondary | ICD-10-CM | POA: Diagnosis not present

## 2022-12-19 DIAGNOSIS — J188 Other pneumonia, unspecified organism: Secondary | ICD-10-CM | POA: Diagnosis not present

## 2022-12-20 DIAGNOSIS — R2681 Unsteadiness on feet: Secondary | ICD-10-CM | POA: Diagnosis not present

## 2022-12-20 DIAGNOSIS — J188 Other pneumonia, unspecified organism: Secondary | ICD-10-CM | POA: Diagnosis not present

## 2022-12-23 DIAGNOSIS — R2681 Unsteadiness on feet: Secondary | ICD-10-CM | POA: Diagnosis not present

## 2022-12-23 DIAGNOSIS — J188 Other pneumonia, unspecified organism: Secondary | ICD-10-CM | POA: Diagnosis not present

## 2022-12-24 DIAGNOSIS — J188 Other pneumonia, unspecified organism: Secondary | ICD-10-CM | POA: Diagnosis not present

## 2022-12-24 DIAGNOSIS — R2681 Unsteadiness on feet: Secondary | ICD-10-CM | POA: Diagnosis not present

## 2022-12-25 DIAGNOSIS — J188 Other pneumonia, unspecified organism: Secondary | ICD-10-CM | POA: Diagnosis not present

## 2022-12-25 DIAGNOSIS — R2681 Unsteadiness on feet: Secondary | ICD-10-CM | POA: Diagnosis not present

## 2022-12-26 DIAGNOSIS — J188 Other pneumonia, unspecified organism: Secondary | ICD-10-CM | POA: Diagnosis not present

## 2022-12-26 DIAGNOSIS — R2681 Unsteadiness on feet: Secondary | ICD-10-CM | POA: Diagnosis not present

## 2022-12-27 DIAGNOSIS — R2681 Unsteadiness on feet: Secondary | ICD-10-CM | POA: Diagnosis not present

## 2022-12-27 DIAGNOSIS — D649 Anemia, unspecified: Secondary | ICD-10-CM | POA: Diagnosis not present

## 2022-12-27 DIAGNOSIS — J188 Other pneumonia, unspecified organism: Secondary | ICD-10-CM | POA: Diagnosis not present

## 2022-12-29 DIAGNOSIS — J188 Other pneumonia, unspecified organism: Secondary | ICD-10-CM | POA: Diagnosis not present

## 2022-12-29 DIAGNOSIS — R2681 Unsteadiness on feet: Secondary | ICD-10-CM | POA: Diagnosis not present

## 2022-12-30 DIAGNOSIS — R2681 Unsteadiness on feet: Secondary | ICD-10-CM | POA: Diagnosis not present

## 2022-12-30 DIAGNOSIS — J188 Other pneumonia, unspecified organism: Secondary | ICD-10-CM | POA: Diagnosis not present

## 2022-12-31 DIAGNOSIS — R2681 Unsteadiness on feet: Secondary | ICD-10-CM | POA: Diagnosis not present

## 2022-12-31 DIAGNOSIS — J188 Other pneumonia, unspecified organism: Secondary | ICD-10-CM | POA: Diagnosis not present

## 2023-01-02 DIAGNOSIS — I10 Essential (primary) hypertension: Secondary | ICD-10-CM | POA: Diagnosis not present

## 2023-01-02 DIAGNOSIS — Z0000 Encounter for general adult medical examination without abnormal findings: Secondary | ICD-10-CM | POA: Diagnosis not present

## 2023-01-02 DIAGNOSIS — J9611 Chronic respiratory failure with hypoxia: Secondary | ICD-10-CM | POA: Diagnosis not present

## 2023-01-02 DIAGNOSIS — R300 Dysuria: Secondary | ICD-10-CM | POA: Diagnosis not present

## 2023-01-02 DIAGNOSIS — J188 Other pneumonia, unspecified organism: Secondary | ICD-10-CM | POA: Diagnosis not present

## 2023-01-02 DIAGNOSIS — R2681 Unsteadiness on feet: Secondary | ICD-10-CM | POA: Diagnosis not present

## 2023-01-03 DIAGNOSIS — R2681 Unsteadiness on feet: Secondary | ICD-10-CM | POA: Diagnosis not present

## 2023-01-03 DIAGNOSIS — J188 Other pneumonia, unspecified organism: Secondary | ICD-10-CM | POA: Diagnosis not present

## 2023-01-05 DIAGNOSIS — R2681 Unsteadiness on feet: Secondary | ICD-10-CM | POA: Diagnosis not present

## 2023-01-05 DIAGNOSIS — J188 Other pneumonia, unspecified organism: Secondary | ICD-10-CM | POA: Diagnosis not present

## 2023-01-06 DIAGNOSIS — J188 Other pneumonia, unspecified organism: Secondary | ICD-10-CM | POA: Diagnosis not present

## 2023-01-06 DIAGNOSIS — R2681 Unsteadiness on feet: Secondary | ICD-10-CM | POA: Diagnosis not present

## 2023-01-07 DIAGNOSIS — R2681 Unsteadiness on feet: Secondary | ICD-10-CM | POA: Diagnosis not present

## 2023-01-07 DIAGNOSIS — J188 Other pneumonia, unspecified organism: Secondary | ICD-10-CM | POA: Diagnosis not present

## 2023-01-08 DIAGNOSIS — J188 Other pneumonia, unspecified organism: Secondary | ICD-10-CM | POA: Diagnosis not present

## 2023-01-08 DIAGNOSIS — R2681 Unsteadiness on feet: Secondary | ICD-10-CM | POA: Diagnosis not present

## 2023-01-09 DIAGNOSIS — R2681 Unsteadiness on feet: Secondary | ICD-10-CM | POA: Diagnosis not present

## 2023-01-09 DIAGNOSIS — J188 Other pneumonia, unspecified organism: Secondary | ICD-10-CM | POA: Diagnosis not present

## 2023-01-10 DIAGNOSIS — R2681 Unsteadiness on feet: Secondary | ICD-10-CM | POA: Diagnosis not present

## 2023-01-10 DIAGNOSIS — J188 Other pneumonia, unspecified organism: Secondary | ICD-10-CM | POA: Diagnosis not present

## 2023-01-13 DIAGNOSIS — J188 Other pneumonia, unspecified organism: Secondary | ICD-10-CM | POA: Diagnosis not present

## 2023-01-13 DIAGNOSIS — R2681 Unsteadiness on feet: Secondary | ICD-10-CM | POA: Diagnosis not present

## 2023-01-14 DIAGNOSIS — J188 Other pneumonia, unspecified organism: Secondary | ICD-10-CM | POA: Diagnosis not present

## 2023-01-14 DIAGNOSIS — R2681 Unsteadiness on feet: Secondary | ICD-10-CM | POA: Diagnosis not present

## 2023-01-15 DIAGNOSIS — J188 Other pneumonia, unspecified organism: Secondary | ICD-10-CM | POA: Diagnosis not present

## 2023-01-15 DIAGNOSIS — R2681 Unsteadiness on feet: Secondary | ICD-10-CM | POA: Diagnosis not present

## 2023-01-16 DIAGNOSIS — R2681 Unsteadiness on feet: Secondary | ICD-10-CM | POA: Diagnosis not present

## 2023-01-16 DIAGNOSIS — J188 Other pneumonia, unspecified organism: Secondary | ICD-10-CM | POA: Diagnosis not present

## 2023-01-17 DIAGNOSIS — R2681 Unsteadiness on feet: Secondary | ICD-10-CM | POA: Diagnosis not present

## 2023-01-17 DIAGNOSIS — J188 Other pneumonia, unspecified organism: Secondary | ICD-10-CM | POA: Diagnosis not present

## 2023-01-19 DIAGNOSIS — R2681 Unsteadiness on feet: Secondary | ICD-10-CM | POA: Diagnosis not present

## 2023-01-19 DIAGNOSIS — J188 Other pneumonia, unspecified organism: Secondary | ICD-10-CM | POA: Diagnosis not present

## 2023-01-20 DIAGNOSIS — M069 Rheumatoid arthritis, unspecified: Secondary | ICD-10-CM | POA: Diagnosis not present

## 2023-01-20 DIAGNOSIS — I10 Essential (primary) hypertension: Secondary | ICD-10-CM | POA: Diagnosis not present

## 2023-01-20 DIAGNOSIS — E039 Hypothyroidism, unspecified: Secondary | ICD-10-CM | POA: Diagnosis not present

## 2023-01-20 DIAGNOSIS — J188 Other pneumonia, unspecified organism: Secondary | ICD-10-CM | POA: Diagnosis not present

## 2023-01-20 DIAGNOSIS — R2681 Unsteadiness on feet: Secondary | ICD-10-CM | POA: Diagnosis not present

## 2023-01-20 DIAGNOSIS — J9611 Chronic respiratory failure with hypoxia: Secondary | ICD-10-CM | POA: Diagnosis not present

## 2023-01-20 DIAGNOSIS — R600 Localized edema: Secondary | ICD-10-CM | POA: Diagnosis not present

## 2023-01-20 DIAGNOSIS — R339 Retention of urine, unspecified: Secondary | ICD-10-CM | POA: Diagnosis not present

## 2023-01-20 DIAGNOSIS — N3289 Other specified disorders of bladder: Secondary | ICD-10-CM | POA: Diagnosis not present

## 2023-01-20 DIAGNOSIS — E785 Hyperlipidemia, unspecified: Secondary | ICD-10-CM | POA: Diagnosis not present

## 2023-01-21 DIAGNOSIS — R2681 Unsteadiness on feet: Secondary | ICD-10-CM | POA: Diagnosis not present

## 2023-01-21 DIAGNOSIS — J188 Other pneumonia, unspecified organism: Secondary | ICD-10-CM | POA: Diagnosis not present

## 2023-01-22 DIAGNOSIS — E039 Hypothyroidism, unspecified: Secondary | ICD-10-CM | POA: Diagnosis not present

## 2023-01-23 DIAGNOSIS — R2681 Unsteadiness on feet: Secondary | ICD-10-CM | POA: Diagnosis not present

## 2023-01-23 DIAGNOSIS — J188 Other pneumonia, unspecified organism: Secondary | ICD-10-CM | POA: Diagnosis not present

## 2023-01-24 DIAGNOSIS — R2681 Unsteadiness on feet: Secondary | ICD-10-CM | POA: Diagnosis not present

## 2023-01-24 DIAGNOSIS — J188 Other pneumonia, unspecified organism: Secondary | ICD-10-CM | POA: Diagnosis not present

## 2023-01-26 DIAGNOSIS — R2681 Unsteadiness on feet: Secondary | ICD-10-CM | POA: Diagnosis not present

## 2023-01-26 DIAGNOSIS — J188 Other pneumonia, unspecified organism: Secondary | ICD-10-CM | POA: Diagnosis not present

## 2023-01-27 DIAGNOSIS — J188 Other pneumonia, unspecified organism: Secondary | ICD-10-CM | POA: Diagnosis not present

## 2023-01-27 DIAGNOSIS — R300 Dysuria: Secondary | ICD-10-CM | POA: Diagnosis not present

## 2023-01-27 DIAGNOSIS — N3289 Other specified disorders of bladder: Secondary | ICD-10-CM | POA: Diagnosis not present

## 2023-01-27 DIAGNOSIS — R600 Localized edema: Secondary | ICD-10-CM | POA: Diagnosis not present

## 2023-01-27 DIAGNOSIS — R2681 Unsteadiness on feet: Secondary | ICD-10-CM | POA: Diagnosis not present

## 2023-01-27 DIAGNOSIS — J9611 Chronic respiratory failure with hypoxia: Secondary | ICD-10-CM | POA: Diagnosis not present

## 2023-01-28 DIAGNOSIS — J188 Other pneumonia, unspecified organism: Secondary | ICD-10-CM | POA: Diagnosis not present

## 2023-01-28 DIAGNOSIS — N390 Urinary tract infection, site not specified: Secondary | ICD-10-CM | POA: Diagnosis not present

## 2023-01-28 DIAGNOSIS — N3289 Other specified disorders of bladder: Secondary | ICD-10-CM | POA: Diagnosis not present

## 2023-01-28 DIAGNOSIS — I10 Essential (primary) hypertension: Secondary | ICD-10-CM | POA: Diagnosis not present

## 2023-01-28 DIAGNOSIS — J9611 Chronic respiratory failure with hypoxia: Secondary | ICD-10-CM | POA: Diagnosis not present

## 2023-01-28 DIAGNOSIS — R2681 Unsteadiness on feet: Secondary | ICD-10-CM | POA: Diagnosis not present

## 2023-01-30 DIAGNOSIS — I10 Essential (primary) hypertension: Secondary | ICD-10-CM | POA: Diagnosis not present

## 2023-01-30 DIAGNOSIS — R600 Localized edema: Secondary | ICD-10-CM | POA: Diagnosis not present

## 2023-01-30 DIAGNOSIS — J9611 Chronic respiratory failure with hypoxia: Secondary | ICD-10-CM | POA: Diagnosis not present

## 2023-01-30 DIAGNOSIS — R2681 Unsteadiness on feet: Secondary | ICD-10-CM | POA: Diagnosis not present

## 2023-01-30 DIAGNOSIS — N390 Urinary tract infection, site not specified: Secondary | ICD-10-CM | POA: Diagnosis not present

## 2023-01-30 DIAGNOSIS — J188 Other pneumonia, unspecified organism: Secondary | ICD-10-CM | POA: Diagnosis not present

## 2023-01-31 DIAGNOSIS — R2681 Unsteadiness on feet: Secondary | ICD-10-CM | POA: Diagnosis not present

## 2023-01-31 DIAGNOSIS — J188 Other pneumonia, unspecified organism: Secondary | ICD-10-CM | POA: Diagnosis not present

## 2023-02-03 DIAGNOSIS — J188 Other pneumonia, unspecified organism: Secondary | ICD-10-CM | POA: Diagnosis not present

## 2023-02-03 DIAGNOSIS — R2681 Unsteadiness on feet: Secondary | ICD-10-CM | POA: Diagnosis not present

## 2023-02-04 DIAGNOSIS — R2681 Unsteadiness on feet: Secondary | ICD-10-CM | POA: Diagnosis not present

## 2023-02-04 DIAGNOSIS — J188 Other pneumonia, unspecified organism: Secondary | ICD-10-CM | POA: Diagnosis not present

## 2023-02-05 DIAGNOSIS — J188 Other pneumonia, unspecified organism: Secondary | ICD-10-CM | POA: Diagnosis not present

## 2023-02-05 DIAGNOSIS — R2681 Unsteadiness on feet: Secondary | ICD-10-CM | POA: Diagnosis not present

## 2023-02-06 DIAGNOSIS — R2681 Unsteadiness on feet: Secondary | ICD-10-CM | POA: Diagnosis not present

## 2023-02-06 DIAGNOSIS — I10 Essential (primary) hypertension: Secondary | ICD-10-CM | POA: Diagnosis not present

## 2023-02-06 DIAGNOSIS — J188 Other pneumonia, unspecified organism: Secondary | ICD-10-CM | POA: Diagnosis not present

## 2023-02-07 DIAGNOSIS — I7091 Generalized atherosclerosis: Secondary | ICD-10-CM | POA: Diagnosis not present

## 2023-02-07 DIAGNOSIS — B351 Tinea unguium: Secondary | ICD-10-CM | POA: Diagnosis not present

## 2023-02-07 DIAGNOSIS — M2041 Other hammer toe(s) (acquired), right foot: Secondary | ICD-10-CM | POA: Diagnosis not present

## 2023-02-07 DIAGNOSIS — M2042 Other hammer toe(s) (acquired), left foot: Secondary | ICD-10-CM | POA: Diagnosis not present

## 2023-02-10 DIAGNOSIS — R2681 Unsteadiness on feet: Secondary | ICD-10-CM | POA: Diagnosis not present

## 2023-02-10 DIAGNOSIS — J188 Other pneumonia, unspecified organism: Secondary | ICD-10-CM | POA: Diagnosis not present

## 2023-02-11 DIAGNOSIS — E119 Type 2 diabetes mellitus without complications: Secondary | ICD-10-CM | POA: Diagnosis not present

## 2023-02-11 DIAGNOSIS — E039 Hypothyroidism, unspecified: Secondary | ICD-10-CM | POA: Diagnosis not present

## 2023-02-11 DIAGNOSIS — I10 Essential (primary) hypertension: Secondary | ICD-10-CM | POA: Diagnosis not present

## 2023-02-11 DIAGNOSIS — R2681 Unsteadiness on feet: Secondary | ICD-10-CM | POA: Diagnosis not present

## 2023-02-11 DIAGNOSIS — J188 Other pneumonia, unspecified organism: Secondary | ICD-10-CM | POA: Diagnosis not present

## 2023-02-11 DIAGNOSIS — D649 Anemia, unspecified: Secondary | ICD-10-CM | POA: Diagnosis not present

## 2023-02-12 DIAGNOSIS — R2681 Unsteadiness on feet: Secondary | ICD-10-CM | POA: Diagnosis not present

## 2023-02-12 DIAGNOSIS — J188 Other pneumonia, unspecified organism: Secondary | ICD-10-CM | POA: Diagnosis not present

## 2023-02-13 DIAGNOSIS — J188 Other pneumonia, unspecified organism: Secondary | ICD-10-CM | POA: Diagnosis not present

## 2023-02-13 DIAGNOSIS — R2681 Unsteadiness on feet: Secondary | ICD-10-CM | POA: Diagnosis not present

## 2023-02-14 DIAGNOSIS — M069 Rheumatoid arthritis, unspecified: Secondary | ICD-10-CM | POA: Diagnosis not present

## 2023-02-14 DIAGNOSIS — R2681 Unsteadiness on feet: Secondary | ICD-10-CM | POA: Diagnosis not present

## 2023-02-14 DIAGNOSIS — J188 Other pneumonia, unspecified organism: Secondary | ICD-10-CM | POA: Diagnosis not present

## 2023-02-14 DIAGNOSIS — E039 Hypothyroidism, unspecified: Secondary | ICD-10-CM | POA: Diagnosis not present

## 2023-02-14 DIAGNOSIS — I10 Essential (primary) hypertension: Secondary | ICD-10-CM | POA: Diagnosis not present

## 2023-02-14 DIAGNOSIS — E785 Hyperlipidemia, unspecified: Secondary | ICD-10-CM | POA: Diagnosis not present

## 2023-02-17 DIAGNOSIS — J188 Other pneumonia, unspecified organism: Secondary | ICD-10-CM | POA: Diagnosis not present

## 2023-02-17 DIAGNOSIS — R2681 Unsteadiness on feet: Secondary | ICD-10-CM | POA: Diagnosis not present

## 2023-02-19 DIAGNOSIS — N3289 Other specified disorders of bladder: Secondary | ICD-10-CM | POA: Diagnosis not present

## 2023-02-19 DIAGNOSIS — R600 Localized edema: Secondary | ICD-10-CM | POA: Diagnosis not present

## 2023-02-19 DIAGNOSIS — R339 Retention of urine, unspecified: Secondary | ICD-10-CM | POA: Diagnosis not present

## 2023-02-19 DIAGNOSIS — J9611 Chronic respiratory failure with hypoxia: Secondary | ICD-10-CM | POA: Diagnosis not present

## 2023-02-27 DIAGNOSIS — I10 Essential (primary) hypertension: Secondary | ICD-10-CM | POA: Diagnosis not present

## 2023-02-27 DIAGNOSIS — N3941 Urge incontinence: Secondary | ICD-10-CM | POA: Diagnosis not present

## 2023-02-27 DIAGNOSIS — J9611 Chronic respiratory failure with hypoxia: Secondary | ICD-10-CM | POA: Diagnosis not present

## 2023-02-27 DIAGNOSIS — N3289 Other specified disorders of bladder: Secondary | ICD-10-CM | POA: Diagnosis not present

## 2023-03-12 DIAGNOSIS — E785 Hyperlipidemia, unspecified: Secondary | ICD-10-CM | POA: Diagnosis not present

## 2023-03-12 DIAGNOSIS — I10 Essential (primary) hypertension: Secondary | ICD-10-CM | POA: Diagnosis not present

## 2023-03-12 DIAGNOSIS — M069 Rheumatoid arthritis, unspecified: Secondary | ICD-10-CM | POA: Diagnosis not present

## 2023-03-12 DIAGNOSIS — E039 Hypothyroidism, unspecified: Secondary | ICD-10-CM | POA: Diagnosis not present

## 2023-03-13 DIAGNOSIS — E559 Vitamin D deficiency, unspecified: Secondary | ICD-10-CM | POA: Diagnosis not present

## 2023-03-14 DIAGNOSIS — R300 Dysuria: Secondary | ICD-10-CM | POA: Diagnosis not present

## 2023-03-14 DIAGNOSIS — R059 Cough, unspecified: Secondary | ICD-10-CM | POA: Diagnosis not present

## 2023-03-14 DIAGNOSIS — J9611 Chronic respiratory failure with hypoxia: Secondary | ICD-10-CM | POA: Diagnosis not present

## 2023-03-15 DIAGNOSIS — N390 Urinary tract infection, site not specified: Secondary | ICD-10-CM | POA: Diagnosis not present

## 2023-03-15 DIAGNOSIS — R509 Fever, unspecified: Secondary | ICD-10-CM | POA: Diagnosis not present

## 2023-03-17 DIAGNOSIS — J22 Unspecified acute lower respiratory infection: Secondary | ICD-10-CM | POA: Diagnosis not present

## 2023-03-17 DIAGNOSIS — R300 Dysuria: Secondary | ICD-10-CM | POA: Diagnosis not present

## 2023-03-17 DIAGNOSIS — J9611 Chronic respiratory failure with hypoxia: Secondary | ICD-10-CM | POA: Diagnosis not present

## 2023-03-17 DIAGNOSIS — R059 Cough, unspecified: Secondary | ICD-10-CM | POA: Diagnosis not present

## 2023-03-18 DIAGNOSIS — J101 Influenza due to other identified influenza virus with other respiratory manifestations: Secondary | ICD-10-CM | POA: Diagnosis not present

## 2023-03-24 DIAGNOSIS — R1312 Dysphagia, oropharyngeal phase: Secondary | ICD-10-CM | POA: Diagnosis not present

## 2023-03-24 DIAGNOSIS — R41841 Cognitive communication deficit: Secondary | ICD-10-CM | POA: Diagnosis not present

## 2023-03-25 DIAGNOSIS — R41841 Cognitive communication deficit: Secondary | ICD-10-CM | POA: Diagnosis not present

## 2023-03-25 DIAGNOSIS — R1312 Dysphagia, oropharyngeal phase: Secondary | ICD-10-CM | POA: Diagnosis not present

## 2023-03-26 DIAGNOSIS — R1312 Dysphagia, oropharyngeal phase: Secondary | ICD-10-CM | POA: Diagnosis not present

## 2023-03-26 DIAGNOSIS — R41841 Cognitive communication deficit: Secondary | ICD-10-CM | POA: Diagnosis not present

## 2023-03-26 DIAGNOSIS — M545 Low back pain, unspecified: Secondary | ICD-10-CM | POA: Diagnosis not present

## 2023-03-27 DIAGNOSIS — S32000A Wedge compression fracture of unspecified lumbar vertebra, initial encounter for closed fracture: Secondary | ICD-10-CM | POA: Diagnosis not present

## 2023-03-27 DIAGNOSIS — R1312 Dysphagia, oropharyngeal phase: Secondary | ICD-10-CM | POA: Diagnosis not present

## 2023-03-27 DIAGNOSIS — R41841 Cognitive communication deficit: Secondary | ICD-10-CM | POA: Diagnosis not present

## 2023-03-28 DIAGNOSIS — R1312 Dysphagia, oropharyngeal phase: Secondary | ICD-10-CM | POA: Diagnosis not present

## 2023-03-28 DIAGNOSIS — R41841 Cognitive communication deficit: Secondary | ICD-10-CM | POA: Diagnosis not present

## 2023-03-31 DIAGNOSIS — N3289 Other specified disorders of bladder: Secondary | ICD-10-CM | POA: Diagnosis not present

## 2023-03-31 DIAGNOSIS — J9611 Chronic respiratory failure with hypoxia: Secondary | ICD-10-CM | POA: Diagnosis not present

## 2023-03-31 DIAGNOSIS — R1312 Dysphagia, oropharyngeal phase: Secondary | ICD-10-CM | POA: Diagnosis not present

## 2023-03-31 DIAGNOSIS — R41841 Cognitive communication deficit: Secondary | ICD-10-CM | POA: Diagnosis not present

## 2023-03-31 DIAGNOSIS — S32000D Wedge compression fracture of unspecified lumbar vertebra, subsequent encounter for fracture with routine healing: Secondary | ICD-10-CM | POA: Diagnosis not present

## 2023-03-31 DIAGNOSIS — I10 Essential (primary) hypertension: Secondary | ICD-10-CM | POA: Diagnosis not present

## 2023-04-01 DIAGNOSIS — R41841 Cognitive communication deficit: Secondary | ICD-10-CM | POA: Diagnosis not present

## 2023-04-01 DIAGNOSIS — R1312 Dysphagia, oropharyngeal phase: Secondary | ICD-10-CM | POA: Diagnosis not present

## 2023-04-02 DIAGNOSIS — R41841 Cognitive communication deficit: Secondary | ICD-10-CM | POA: Diagnosis not present

## 2023-04-02 DIAGNOSIS — M545 Low back pain, unspecified: Secondary | ICD-10-CM | POA: Diagnosis not present

## 2023-04-02 DIAGNOSIS — R1312 Dysphagia, oropharyngeal phase: Secondary | ICD-10-CM | POA: Diagnosis not present

## 2023-04-03 DIAGNOSIS — R1312 Dysphagia, oropharyngeal phase: Secondary | ICD-10-CM | POA: Diagnosis not present

## 2023-04-03 DIAGNOSIS — R41841 Cognitive communication deficit: Secondary | ICD-10-CM | POA: Diagnosis not present

## 2023-04-04 DIAGNOSIS — R1312 Dysphagia, oropharyngeal phase: Secondary | ICD-10-CM | POA: Diagnosis not present

## 2023-04-04 DIAGNOSIS — R41841 Cognitive communication deficit: Secondary | ICD-10-CM | POA: Diagnosis not present

## 2023-04-07 DIAGNOSIS — R41841 Cognitive communication deficit: Secondary | ICD-10-CM | POA: Diagnosis not present

## 2023-04-07 DIAGNOSIS — R1312 Dysphagia, oropharyngeal phase: Secondary | ICD-10-CM | POA: Diagnosis not present

## 2023-04-08 DIAGNOSIS — R41841 Cognitive communication deficit: Secondary | ICD-10-CM | POA: Diagnosis not present

## 2023-04-08 DIAGNOSIS — R1312 Dysphagia, oropharyngeal phase: Secondary | ICD-10-CM | POA: Diagnosis not present

## 2023-04-09 DIAGNOSIS — R1312 Dysphagia, oropharyngeal phase: Secondary | ICD-10-CM | POA: Diagnosis not present

## 2023-04-09 DIAGNOSIS — R41841 Cognitive communication deficit: Secondary | ICD-10-CM | POA: Diagnosis not present

## 2023-04-10 DIAGNOSIS — I10 Essential (primary) hypertension: Secondary | ICD-10-CM | POA: Diagnosis not present

## 2023-04-10 DIAGNOSIS — R41841 Cognitive communication deficit: Secondary | ICD-10-CM | POA: Diagnosis not present

## 2023-04-10 DIAGNOSIS — R1312 Dysphagia, oropharyngeal phase: Secondary | ICD-10-CM | POA: Diagnosis not present

## 2023-04-11 DIAGNOSIS — R1312 Dysphagia, oropharyngeal phase: Secondary | ICD-10-CM | POA: Diagnosis not present

## 2023-04-11 DIAGNOSIS — R41841 Cognitive communication deficit: Secondary | ICD-10-CM | POA: Diagnosis not present

## 2023-04-14 DIAGNOSIS — M069 Rheumatoid arthritis, unspecified: Secondary | ICD-10-CM | POA: Diagnosis not present

## 2023-04-14 DIAGNOSIS — I10 Essential (primary) hypertension: Secondary | ICD-10-CM | POA: Diagnosis not present

## 2023-04-14 DIAGNOSIS — F419 Anxiety disorder, unspecified: Secondary | ICD-10-CM | POA: Diagnosis not present

## 2023-04-14 DIAGNOSIS — E785 Hyperlipidemia, unspecified: Secondary | ICD-10-CM | POA: Diagnosis not present

## 2023-04-14 DIAGNOSIS — R41841 Cognitive communication deficit: Secondary | ICD-10-CM | POA: Diagnosis not present

## 2023-04-14 DIAGNOSIS — R1312 Dysphagia, oropharyngeal phase: Secondary | ICD-10-CM | POA: Diagnosis not present

## 2023-04-15 DIAGNOSIS — R41841 Cognitive communication deficit: Secondary | ICD-10-CM | POA: Diagnosis not present

## 2023-04-15 DIAGNOSIS — R1312 Dysphagia, oropharyngeal phase: Secondary | ICD-10-CM | POA: Diagnosis not present

## 2023-04-16 DIAGNOSIS — R1312 Dysphagia, oropharyngeal phase: Secondary | ICD-10-CM | POA: Diagnosis not present

## 2023-04-16 DIAGNOSIS — R41841 Cognitive communication deficit: Secondary | ICD-10-CM | POA: Diagnosis not present

## 2023-04-17 DIAGNOSIS — R1312 Dysphagia, oropharyngeal phase: Secondary | ICD-10-CM | POA: Diagnosis not present

## 2023-04-17 DIAGNOSIS — R41841 Cognitive communication deficit: Secondary | ICD-10-CM | POA: Diagnosis not present

## 2023-04-18 DIAGNOSIS — R41841 Cognitive communication deficit: Secondary | ICD-10-CM | POA: Diagnosis not present

## 2023-04-18 DIAGNOSIS — R1312 Dysphagia, oropharyngeal phase: Secondary | ICD-10-CM | POA: Diagnosis not present

## 2023-04-21 DIAGNOSIS — R41841 Cognitive communication deficit: Secondary | ICD-10-CM | POA: Diagnosis not present

## 2023-04-21 DIAGNOSIS — R1312 Dysphagia, oropharyngeal phase: Secondary | ICD-10-CM | POA: Diagnosis not present

## 2023-04-22 DIAGNOSIS — R41841 Cognitive communication deficit: Secondary | ICD-10-CM | POA: Diagnosis not present

## 2023-04-22 DIAGNOSIS — R1312 Dysphagia, oropharyngeal phase: Secondary | ICD-10-CM | POA: Diagnosis not present

## 2023-04-23 DIAGNOSIS — F419 Anxiety disorder, unspecified: Secondary | ICD-10-CM | POA: Diagnosis not present

## 2023-04-23 DIAGNOSIS — R41841 Cognitive communication deficit: Secondary | ICD-10-CM | POA: Diagnosis not present

## 2023-04-23 DIAGNOSIS — R1312 Dysphagia, oropharyngeal phase: Secondary | ICD-10-CM | POA: Diagnosis not present

## 2023-04-23 DIAGNOSIS — S32000D Wedge compression fracture of unspecified lumbar vertebra, subsequent encounter for fracture with routine healing: Secondary | ICD-10-CM | POA: Diagnosis not present

## 2023-04-23 DIAGNOSIS — J9611 Chronic respiratory failure with hypoxia: Secondary | ICD-10-CM | POA: Diagnosis not present

## 2023-04-23 DIAGNOSIS — N3289 Other specified disorders of bladder: Secondary | ICD-10-CM | POA: Diagnosis not present

## 2023-04-24 DIAGNOSIS — N3289 Other specified disorders of bladder: Secondary | ICD-10-CM | POA: Diagnosis not present

## 2023-04-24 DIAGNOSIS — I10 Essential (primary) hypertension: Secondary | ICD-10-CM | POA: Diagnosis not present

## 2023-04-24 DIAGNOSIS — S32000D Wedge compression fracture of unspecified lumbar vertebra, subsequent encounter for fracture with routine healing: Secondary | ICD-10-CM | POA: Diagnosis not present

## 2023-04-24 DIAGNOSIS — R41841 Cognitive communication deficit: Secondary | ICD-10-CM | POA: Diagnosis not present

## 2023-04-24 DIAGNOSIS — J9611 Chronic respiratory failure with hypoxia: Secondary | ICD-10-CM | POA: Diagnosis not present

## 2023-04-24 DIAGNOSIS — R1312 Dysphagia, oropharyngeal phase: Secondary | ICD-10-CM | POA: Diagnosis not present

## 2023-04-25 DIAGNOSIS — R41841 Cognitive communication deficit: Secondary | ICD-10-CM | POA: Diagnosis not present

## 2023-04-25 DIAGNOSIS — R1312 Dysphagia, oropharyngeal phase: Secondary | ICD-10-CM | POA: Diagnosis not present

## 2023-04-28 DIAGNOSIS — R41841 Cognitive communication deficit: Secondary | ICD-10-CM | POA: Diagnosis not present

## 2023-04-28 DIAGNOSIS — R1312 Dysphagia, oropharyngeal phase: Secondary | ICD-10-CM | POA: Diagnosis not present

## 2023-04-28 DIAGNOSIS — L89899 Pressure ulcer of other site, unspecified stage: Secondary | ICD-10-CM | POA: Diagnosis not present

## 2023-04-28 DIAGNOSIS — M79672 Pain in left foot: Secondary | ICD-10-CM | POA: Diagnosis not present

## 2023-04-28 DIAGNOSIS — J9611 Chronic respiratory failure with hypoxia: Secondary | ICD-10-CM | POA: Diagnosis not present

## 2023-04-28 DIAGNOSIS — S32000D Wedge compression fracture of unspecified lumbar vertebra, subsequent encounter for fracture with routine healing: Secondary | ICD-10-CM | POA: Diagnosis not present

## 2023-04-29 DIAGNOSIS — R41841 Cognitive communication deficit: Secondary | ICD-10-CM | POA: Diagnosis not present

## 2023-04-29 DIAGNOSIS — R1312 Dysphagia, oropharyngeal phase: Secondary | ICD-10-CM | POA: Diagnosis not present

## 2023-04-30 DIAGNOSIS — R1312 Dysphagia, oropharyngeal phase: Secondary | ICD-10-CM | POA: Diagnosis not present

## 2023-04-30 DIAGNOSIS — R41841 Cognitive communication deficit: Secondary | ICD-10-CM | POA: Diagnosis not present

## 2023-05-01 DIAGNOSIS — R41841 Cognitive communication deficit: Secondary | ICD-10-CM | POA: Diagnosis not present

## 2023-05-01 DIAGNOSIS — R1312 Dysphagia, oropharyngeal phase: Secondary | ICD-10-CM | POA: Diagnosis not present

## 2023-05-02 DIAGNOSIS — R1312 Dysphagia, oropharyngeal phase: Secondary | ICD-10-CM | POA: Diagnosis not present

## 2023-05-02 DIAGNOSIS — R41841 Cognitive communication deficit: Secondary | ICD-10-CM | POA: Diagnosis not present

## 2023-05-05 DIAGNOSIS — M545 Low back pain, unspecified: Secondary | ICD-10-CM | POA: Diagnosis not present

## 2023-05-05 DIAGNOSIS — R41841 Cognitive communication deficit: Secondary | ICD-10-CM | POA: Diagnosis not present

## 2023-05-05 DIAGNOSIS — R1312 Dysphagia, oropharyngeal phase: Secondary | ICD-10-CM | POA: Diagnosis not present

## 2023-05-06 DIAGNOSIS — L89892 Pressure ulcer of other site, stage 2: Secondary | ICD-10-CM | POA: Diagnosis not present

## 2023-05-06 DIAGNOSIS — R1312 Dysphagia, oropharyngeal phase: Secondary | ICD-10-CM | POA: Diagnosis not present

## 2023-05-06 DIAGNOSIS — R41841 Cognitive communication deficit: Secondary | ICD-10-CM | POA: Diagnosis not present

## 2023-05-07 DIAGNOSIS — R1312 Dysphagia, oropharyngeal phase: Secondary | ICD-10-CM | POA: Diagnosis not present

## 2023-05-07 DIAGNOSIS — R41841 Cognitive communication deficit: Secondary | ICD-10-CM | POA: Diagnosis not present

## 2023-05-08 DIAGNOSIS — M6281 Muscle weakness (generalized): Secondary | ICD-10-CM | POA: Diagnosis not present

## 2023-05-08 DIAGNOSIS — R41841 Cognitive communication deficit: Secondary | ICD-10-CM | POA: Diagnosis not present

## 2023-05-08 DIAGNOSIS — Z9181 History of falling: Secondary | ICD-10-CM | POA: Diagnosis not present

## 2023-05-08 DIAGNOSIS — R293 Abnormal posture: Secondary | ICD-10-CM | POA: Diagnosis not present

## 2023-05-08 DIAGNOSIS — R1312 Dysphagia, oropharyngeal phase: Secondary | ICD-10-CM | POA: Diagnosis not present

## 2023-05-09 DIAGNOSIS — R41841 Cognitive communication deficit: Secondary | ICD-10-CM | POA: Diagnosis not present

## 2023-05-09 DIAGNOSIS — R293 Abnormal posture: Secondary | ICD-10-CM | POA: Diagnosis not present

## 2023-05-09 DIAGNOSIS — M6281 Muscle weakness (generalized): Secondary | ICD-10-CM | POA: Diagnosis not present

## 2023-05-09 DIAGNOSIS — Z9181 History of falling: Secondary | ICD-10-CM | POA: Diagnosis not present

## 2023-05-09 DIAGNOSIS — R1312 Dysphagia, oropharyngeal phase: Secondary | ICD-10-CM | POA: Diagnosis not present

## 2023-05-11 DIAGNOSIS — R41841 Cognitive communication deficit: Secondary | ICD-10-CM | POA: Diagnosis not present

## 2023-05-11 DIAGNOSIS — R293 Abnormal posture: Secondary | ICD-10-CM | POA: Diagnosis not present

## 2023-05-11 DIAGNOSIS — R1312 Dysphagia, oropharyngeal phase: Secondary | ICD-10-CM | POA: Diagnosis not present

## 2023-05-11 DIAGNOSIS — Z9181 History of falling: Secondary | ICD-10-CM | POA: Diagnosis not present

## 2023-05-11 DIAGNOSIS — M6281 Muscle weakness (generalized): Secondary | ICD-10-CM | POA: Diagnosis not present

## 2023-05-12 DIAGNOSIS — R41841 Cognitive communication deficit: Secondary | ICD-10-CM | POA: Diagnosis not present

## 2023-05-12 DIAGNOSIS — Z9181 History of falling: Secondary | ICD-10-CM | POA: Diagnosis not present

## 2023-05-12 DIAGNOSIS — I7091 Generalized atherosclerosis: Secondary | ICD-10-CM | POA: Diagnosis not present

## 2023-05-12 DIAGNOSIS — R1312 Dysphagia, oropharyngeal phase: Secondary | ICD-10-CM | POA: Diagnosis not present

## 2023-05-12 DIAGNOSIS — B351 Tinea unguium: Secondary | ICD-10-CM | POA: Diagnosis not present

## 2023-05-12 DIAGNOSIS — M6281 Muscle weakness (generalized): Secondary | ICD-10-CM | POA: Diagnosis not present

## 2023-05-12 DIAGNOSIS — R293 Abnormal posture: Secondary | ICD-10-CM | POA: Diagnosis not present

## 2023-05-13 DIAGNOSIS — R41841 Cognitive communication deficit: Secondary | ICD-10-CM | POA: Diagnosis not present

## 2023-05-13 DIAGNOSIS — M6281 Muscle weakness (generalized): Secondary | ICD-10-CM | POA: Diagnosis not present

## 2023-05-13 DIAGNOSIS — L89892 Pressure ulcer of other site, stage 2: Secondary | ICD-10-CM | POA: Diagnosis not present

## 2023-05-13 DIAGNOSIS — R293 Abnormal posture: Secondary | ICD-10-CM | POA: Diagnosis not present

## 2023-05-13 DIAGNOSIS — R1312 Dysphagia, oropharyngeal phase: Secondary | ICD-10-CM | POA: Diagnosis not present

## 2023-05-13 DIAGNOSIS — Z9181 History of falling: Secondary | ICD-10-CM | POA: Diagnosis not present

## 2023-05-14 DIAGNOSIS — M6281 Muscle weakness (generalized): Secondary | ICD-10-CM | POA: Diagnosis not present

## 2023-05-14 DIAGNOSIS — Z9181 History of falling: Secondary | ICD-10-CM | POA: Diagnosis not present

## 2023-05-14 DIAGNOSIS — R41841 Cognitive communication deficit: Secondary | ICD-10-CM | POA: Diagnosis not present

## 2023-05-14 DIAGNOSIS — R1312 Dysphagia, oropharyngeal phase: Secondary | ICD-10-CM | POA: Diagnosis not present

## 2023-05-14 DIAGNOSIS — R293 Abnormal posture: Secondary | ICD-10-CM | POA: Diagnosis not present

## 2023-05-15 DIAGNOSIS — M6281 Muscle weakness (generalized): Secondary | ICD-10-CM | POA: Diagnosis not present

## 2023-05-15 DIAGNOSIS — Z9181 History of falling: Secondary | ICD-10-CM | POA: Diagnosis not present

## 2023-05-15 DIAGNOSIS — R293 Abnormal posture: Secondary | ICD-10-CM | POA: Diagnosis not present

## 2023-05-15 DIAGNOSIS — R41841 Cognitive communication deficit: Secondary | ICD-10-CM | POA: Diagnosis not present

## 2023-05-15 DIAGNOSIS — R1312 Dysphagia, oropharyngeal phase: Secondary | ICD-10-CM | POA: Diagnosis not present

## 2023-05-16 DIAGNOSIS — Z9181 History of falling: Secondary | ICD-10-CM | POA: Diagnosis not present

## 2023-05-16 DIAGNOSIS — R1312 Dysphagia, oropharyngeal phase: Secondary | ICD-10-CM | POA: Diagnosis not present

## 2023-05-16 DIAGNOSIS — M6281 Muscle weakness (generalized): Secondary | ICD-10-CM | POA: Diagnosis not present

## 2023-05-16 DIAGNOSIS — R41841 Cognitive communication deficit: Secondary | ICD-10-CM | POA: Diagnosis not present

## 2023-05-16 DIAGNOSIS — R293 Abnormal posture: Secondary | ICD-10-CM | POA: Diagnosis not present

## 2023-05-19 DIAGNOSIS — M6281 Muscle weakness (generalized): Secondary | ICD-10-CM | POA: Diagnosis not present

## 2023-05-19 DIAGNOSIS — R293 Abnormal posture: Secondary | ICD-10-CM | POA: Diagnosis not present

## 2023-05-19 DIAGNOSIS — R1312 Dysphagia, oropharyngeal phase: Secondary | ICD-10-CM | POA: Diagnosis not present

## 2023-05-19 DIAGNOSIS — R41841 Cognitive communication deficit: Secondary | ICD-10-CM | POA: Diagnosis not present

## 2023-05-19 DIAGNOSIS — Z9181 History of falling: Secondary | ICD-10-CM | POA: Diagnosis not present

## 2023-05-20 DIAGNOSIS — R41841 Cognitive communication deficit: Secondary | ICD-10-CM | POA: Diagnosis not present

## 2023-05-20 DIAGNOSIS — M6281 Muscle weakness (generalized): Secondary | ICD-10-CM | POA: Diagnosis not present

## 2023-05-20 DIAGNOSIS — Z9181 History of falling: Secondary | ICD-10-CM | POA: Diagnosis not present

## 2023-05-20 DIAGNOSIS — R293 Abnormal posture: Secondary | ICD-10-CM | POA: Diagnosis not present

## 2023-05-20 DIAGNOSIS — R1312 Dysphagia, oropharyngeal phase: Secondary | ICD-10-CM | POA: Diagnosis not present

## 2023-05-21 DIAGNOSIS — R293 Abnormal posture: Secondary | ICD-10-CM | POA: Diagnosis not present

## 2023-05-21 DIAGNOSIS — Z9181 History of falling: Secondary | ICD-10-CM | POA: Diagnosis not present

## 2023-05-21 DIAGNOSIS — R1312 Dysphagia, oropharyngeal phase: Secondary | ICD-10-CM | POA: Diagnosis not present

## 2023-05-21 DIAGNOSIS — M6281 Muscle weakness (generalized): Secondary | ICD-10-CM | POA: Diagnosis not present

## 2023-05-21 DIAGNOSIS — R41841 Cognitive communication deficit: Secondary | ICD-10-CM | POA: Diagnosis not present

## 2023-05-22 DIAGNOSIS — R293 Abnormal posture: Secondary | ICD-10-CM | POA: Diagnosis not present

## 2023-05-22 DIAGNOSIS — Z9181 History of falling: Secondary | ICD-10-CM | POA: Diagnosis not present

## 2023-05-22 DIAGNOSIS — J209 Acute bronchitis, unspecified: Secondary | ICD-10-CM | POA: Diagnosis not present

## 2023-05-22 DIAGNOSIS — L89899 Pressure ulcer of other site, unspecified stage: Secondary | ICD-10-CM | POA: Diagnosis not present

## 2023-05-22 DIAGNOSIS — R1312 Dysphagia, oropharyngeal phase: Secondary | ICD-10-CM | POA: Diagnosis not present

## 2023-05-22 DIAGNOSIS — R41841 Cognitive communication deficit: Secondary | ICD-10-CM | POA: Diagnosis not present

## 2023-05-22 DIAGNOSIS — M6281 Muscle weakness (generalized): Secondary | ICD-10-CM | POA: Diagnosis not present

## 2023-05-22 DIAGNOSIS — I10 Essential (primary) hypertension: Secondary | ICD-10-CM | POA: Diagnosis not present

## 2023-05-22 DIAGNOSIS — M79672 Pain in left foot: Secondary | ICD-10-CM | POA: Diagnosis not present

## 2023-05-23 DIAGNOSIS — R293 Abnormal posture: Secondary | ICD-10-CM | POA: Diagnosis not present

## 2023-05-23 DIAGNOSIS — Z9181 History of falling: Secondary | ICD-10-CM | POA: Diagnosis not present

## 2023-05-23 DIAGNOSIS — J189 Pneumonia, unspecified organism: Secondary | ICD-10-CM | POA: Diagnosis not present

## 2023-05-23 DIAGNOSIS — R059 Cough, unspecified: Secondary | ICD-10-CM | POA: Diagnosis not present

## 2023-05-23 DIAGNOSIS — M6281 Muscle weakness (generalized): Secondary | ICD-10-CM | POA: Diagnosis not present

## 2023-05-23 DIAGNOSIS — R1312 Dysphagia, oropharyngeal phase: Secondary | ICD-10-CM | POA: Diagnosis not present

## 2023-05-23 DIAGNOSIS — R41841 Cognitive communication deficit: Secondary | ICD-10-CM | POA: Diagnosis not present

## 2023-05-24 DIAGNOSIS — B029 Zoster without complications: Secondary | ICD-10-CM | POA: Diagnosis not present

## 2023-05-24 DIAGNOSIS — N390 Urinary tract infection, site not specified: Secondary | ICD-10-CM | POA: Diagnosis not present

## 2023-05-26 DIAGNOSIS — M6281 Muscle weakness (generalized): Secondary | ICD-10-CM | POA: Diagnosis not present

## 2023-05-26 DIAGNOSIS — J189 Pneumonia, unspecified organism: Secondary | ICD-10-CM | POA: Diagnosis not present

## 2023-05-26 DIAGNOSIS — R293 Abnormal posture: Secondary | ICD-10-CM | POA: Diagnosis not present

## 2023-05-26 DIAGNOSIS — N3289 Other specified disorders of bladder: Secondary | ICD-10-CM | POA: Diagnosis not present

## 2023-05-26 DIAGNOSIS — R41841 Cognitive communication deficit: Secondary | ICD-10-CM | POA: Diagnosis not present

## 2023-05-26 DIAGNOSIS — B029 Zoster without complications: Secondary | ICD-10-CM | POA: Diagnosis not present

## 2023-05-26 DIAGNOSIS — I10 Essential (primary) hypertension: Secondary | ICD-10-CM | POA: Diagnosis not present

## 2023-05-26 DIAGNOSIS — G894 Chronic pain syndrome: Secondary | ICD-10-CM | POA: Diagnosis not present

## 2023-05-26 DIAGNOSIS — M069 Rheumatoid arthritis, unspecified: Secondary | ICD-10-CM | POA: Diagnosis not present

## 2023-05-26 DIAGNOSIS — E785 Hyperlipidemia, unspecified: Secondary | ICD-10-CM | POA: Diagnosis not present

## 2023-05-26 DIAGNOSIS — Z9181 History of falling: Secondary | ICD-10-CM | POA: Diagnosis not present

## 2023-05-26 DIAGNOSIS — F419 Anxiety disorder, unspecified: Secondary | ICD-10-CM | POA: Diagnosis not present

## 2023-05-26 DIAGNOSIS — R1312 Dysphagia, oropharyngeal phase: Secondary | ICD-10-CM | POA: Diagnosis not present

## 2023-05-28 DIAGNOSIS — M6281 Muscle weakness (generalized): Secondary | ICD-10-CM | POA: Diagnosis not present

## 2023-05-28 DIAGNOSIS — Z9181 History of falling: Secondary | ICD-10-CM | POA: Diagnosis not present

## 2023-05-28 DIAGNOSIS — R1312 Dysphagia, oropharyngeal phase: Secondary | ICD-10-CM | POA: Diagnosis not present

## 2023-05-28 DIAGNOSIS — R293 Abnormal posture: Secondary | ICD-10-CM | POA: Diagnosis not present

## 2023-05-28 DIAGNOSIS — R41841 Cognitive communication deficit: Secondary | ICD-10-CM | POA: Diagnosis not present

## 2023-05-29 DIAGNOSIS — M6281 Muscle weakness (generalized): Secondary | ICD-10-CM | POA: Diagnosis not present

## 2023-05-29 DIAGNOSIS — R1312 Dysphagia, oropharyngeal phase: Secondary | ICD-10-CM | POA: Diagnosis not present

## 2023-05-29 DIAGNOSIS — Z9181 History of falling: Secondary | ICD-10-CM | POA: Diagnosis not present

## 2023-05-29 DIAGNOSIS — R293 Abnormal posture: Secondary | ICD-10-CM | POA: Diagnosis not present

## 2023-05-29 DIAGNOSIS — R41841 Cognitive communication deficit: Secondary | ICD-10-CM | POA: Diagnosis not present

## 2023-05-30 DIAGNOSIS — R41841 Cognitive communication deficit: Secondary | ICD-10-CM | POA: Diagnosis not present

## 2023-05-30 DIAGNOSIS — N189 Chronic kidney disease, unspecified: Secondary | ICD-10-CM | POA: Diagnosis not present

## 2023-05-30 DIAGNOSIS — M6281 Muscle weakness (generalized): Secondary | ICD-10-CM | POA: Diagnosis not present

## 2023-05-30 DIAGNOSIS — R293 Abnormal posture: Secondary | ICD-10-CM | POA: Diagnosis not present

## 2023-05-30 DIAGNOSIS — Z9181 History of falling: Secondary | ICD-10-CM | POA: Diagnosis not present

## 2023-05-30 DIAGNOSIS — R1312 Dysphagia, oropharyngeal phase: Secondary | ICD-10-CM | POA: Diagnosis not present

## 2023-05-30 DIAGNOSIS — I10 Essential (primary) hypertension: Secondary | ICD-10-CM | POA: Diagnosis not present

## 2023-05-30 DIAGNOSIS — N179 Acute kidney failure, unspecified: Secondary | ICD-10-CM | POA: Diagnosis not present

## 2023-05-31 DIAGNOSIS — B370 Candidal stomatitis: Secondary | ICD-10-CM | POA: Diagnosis not present

## 2023-05-31 DIAGNOSIS — I10 Essential (primary) hypertension: Secondary | ICD-10-CM | POA: Diagnosis not present

## 2023-06-02 DIAGNOSIS — R41841 Cognitive communication deficit: Secondary | ICD-10-CM | POA: Diagnosis not present

## 2023-06-02 DIAGNOSIS — R293 Abnormal posture: Secondary | ICD-10-CM | POA: Diagnosis not present

## 2023-06-02 DIAGNOSIS — Z9181 History of falling: Secondary | ICD-10-CM | POA: Diagnosis not present

## 2023-06-02 DIAGNOSIS — M6281 Muscle weakness (generalized): Secondary | ICD-10-CM | POA: Diagnosis not present

## 2023-06-02 DIAGNOSIS — R1312 Dysphagia, oropharyngeal phase: Secondary | ICD-10-CM | POA: Diagnosis not present

## 2023-06-04 DIAGNOSIS — R1312 Dysphagia, oropharyngeal phase: Secondary | ICD-10-CM | POA: Diagnosis not present

## 2023-06-04 DIAGNOSIS — Z9181 History of falling: Secondary | ICD-10-CM | POA: Diagnosis not present

## 2023-06-04 DIAGNOSIS — R293 Abnormal posture: Secondary | ICD-10-CM | POA: Diagnosis not present

## 2023-06-04 DIAGNOSIS — M6281 Muscle weakness (generalized): Secondary | ICD-10-CM | POA: Diagnosis not present

## 2023-06-04 DIAGNOSIS — R41841 Cognitive communication deficit: Secondary | ICD-10-CM | POA: Diagnosis not present

## 2023-06-05 DIAGNOSIS — E871 Hypo-osmolality and hyponatremia: Secondary | ICD-10-CM | POA: Diagnosis not present

## 2023-06-05 DIAGNOSIS — R293 Abnormal posture: Secondary | ICD-10-CM | POA: Diagnosis not present

## 2023-06-05 DIAGNOSIS — R41841 Cognitive communication deficit: Secondary | ICD-10-CM | POA: Diagnosis not present

## 2023-06-05 DIAGNOSIS — Z9181 History of falling: Secondary | ICD-10-CM | POA: Diagnosis not present

## 2023-06-05 DIAGNOSIS — R1312 Dysphagia, oropharyngeal phase: Secondary | ICD-10-CM | POA: Diagnosis not present

## 2023-06-05 DIAGNOSIS — M6281 Muscle weakness (generalized): Secondary | ICD-10-CM | POA: Diagnosis not present

## 2023-06-06 DIAGNOSIS — Z9181 History of falling: Secondary | ICD-10-CM | POA: Diagnosis not present

## 2023-06-06 DIAGNOSIS — R1312 Dysphagia, oropharyngeal phase: Secondary | ICD-10-CM | POA: Diagnosis not present

## 2023-06-06 DIAGNOSIS — R41841 Cognitive communication deficit: Secondary | ICD-10-CM | POA: Diagnosis not present

## 2023-06-06 DIAGNOSIS — R293 Abnormal posture: Secondary | ICD-10-CM | POA: Diagnosis not present

## 2023-06-06 DIAGNOSIS — M6281 Muscle weakness (generalized): Secondary | ICD-10-CM | POA: Diagnosis not present

## 2023-06-09 DIAGNOSIS — G4719 Other hypersomnia: Secondary | ICD-10-CM | POA: Diagnosis not present

## 2023-06-09 DIAGNOSIS — R062 Wheezing: Secondary | ICD-10-CM | POA: Diagnosis not present

## 2023-06-09 DIAGNOSIS — R293 Abnormal posture: Secondary | ICD-10-CM | POA: Diagnosis not present

## 2023-06-09 DIAGNOSIS — Z9181 History of falling: Secondary | ICD-10-CM | POA: Diagnosis not present

## 2023-06-09 DIAGNOSIS — N189 Chronic kidney disease, unspecified: Secondary | ICD-10-CM | POA: Diagnosis not present

## 2023-06-09 DIAGNOSIS — M6281 Muscle weakness (generalized): Secondary | ICD-10-CM | POA: Diagnosis not present

## 2023-06-09 DIAGNOSIS — I10 Essential (primary) hypertension: Secondary | ICD-10-CM | POA: Diagnosis not present

## 2023-06-09 DIAGNOSIS — B029 Zoster without complications: Secondary | ICD-10-CM | POA: Diagnosis not present

## 2023-06-10 DIAGNOSIS — Z9181 History of falling: Secondary | ICD-10-CM | POA: Diagnosis not present

## 2023-06-10 DIAGNOSIS — J189 Pneumonia, unspecified organism: Secondary | ICD-10-CM | POA: Diagnosis not present

## 2023-06-10 DIAGNOSIS — R293 Abnormal posture: Secondary | ICD-10-CM | POA: Diagnosis not present

## 2023-06-10 DIAGNOSIS — M6281 Muscle weakness (generalized): Secondary | ICD-10-CM | POA: Diagnosis not present

## 2023-06-11 DIAGNOSIS — G4719 Other hypersomnia: Secondary | ICD-10-CM | POA: Diagnosis not present

## 2023-06-11 DIAGNOSIS — R293 Abnormal posture: Secondary | ICD-10-CM | POA: Diagnosis not present

## 2023-06-11 DIAGNOSIS — Z9181 History of falling: Secondary | ICD-10-CM | POA: Diagnosis not present

## 2023-06-11 DIAGNOSIS — S32000D Wedge compression fracture of unspecified lumbar vertebra, subsequent encounter for fracture with routine healing: Secondary | ICD-10-CM | POA: Diagnosis not present

## 2023-06-11 DIAGNOSIS — M6281 Muscle weakness (generalized): Secondary | ICD-10-CM | POA: Diagnosis not present

## 2023-06-11 DIAGNOSIS — B029 Zoster without complications: Secondary | ICD-10-CM | POA: Diagnosis not present

## 2023-06-11 DIAGNOSIS — N189 Chronic kidney disease, unspecified: Secondary | ICD-10-CM | POA: Diagnosis not present

## 2023-06-12 DIAGNOSIS — Z9181 History of falling: Secondary | ICD-10-CM | POA: Diagnosis not present

## 2023-06-12 DIAGNOSIS — R300 Dysuria: Secondary | ICD-10-CM | POA: Diagnosis not present

## 2023-06-12 DIAGNOSIS — R293 Abnormal posture: Secondary | ICD-10-CM | POA: Diagnosis not present

## 2023-06-12 DIAGNOSIS — M6281 Muscle weakness (generalized): Secondary | ICD-10-CM | POA: Diagnosis not present

## 2023-06-13 DIAGNOSIS — M6281 Muscle weakness (generalized): Secondary | ICD-10-CM | POA: Diagnosis not present

## 2023-06-13 DIAGNOSIS — R293 Abnormal posture: Secondary | ICD-10-CM | POA: Diagnosis not present

## 2023-06-13 DIAGNOSIS — Z9181 History of falling: Secondary | ICD-10-CM | POA: Diagnosis not present

## 2023-06-16 DIAGNOSIS — Z9181 History of falling: Secondary | ICD-10-CM | POA: Diagnosis not present

## 2023-06-16 DIAGNOSIS — M6281 Muscle weakness (generalized): Secondary | ICD-10-CM | POA: Diagnosis not present

## 2023-06-16 DIAGNOSIS — J189 Pneumonia, unspecified organism: Secondary | ICD-10-CM | POA: Diagnosis not present

## 2023-06-16 DIAGNOSIS — R293 Abnormal posture: Secondary | ICD-10-CM | POA: Diagnosis not present

## 2023-06-17 DIAGNOSIS — R293 Abnormal posture: Secondary | ICD-10-CM | POA: Diagnosis not present

## 2023-06-17 DIAGNOSIS — Z9181 History of falling: Secondary | ICD-10-CM | POA: Diagnosis not present

## 2023-06-17 DIAGNOSIS — M6281 Muscle weakness (generalized): Secondary | ICD-10-CM | POA: Diagnosis not present

## 2023-06-18 DIAGNOSIS — G4719 Other hypersomnia: Secondary | ICD-10-CM | POA: Diagnosis not present

## 2023-06-18 DIAGNOSIS — R293 Abnormal posture: Secondary | ICD-10-CM | POA: Diagnosis not present

## 2023-06-18 DIAGNOSIS — S32000D Wedge compression fracture of unspecified lumbar vertebra, subsequent encounter for fracture with routine healing: Secondary | ICD-10-CM | POA: Diagnosis not present

## 2023-06-18 DIAGNOSIS — M6281 Muscle weakness (generalized): Secondary | ICD-10-CM | POA: Diagnosis not present

## 2023-06-18 DIAGNOSIS — N3289 Other specified disorders of bladder: Secondary | ICD-10-CM | POA: Diagnosis not present

## 2023-06-18 DIAGNOSIS — N189 Chronic kidney disease, unspecified: Secondary | ICD-10-CM | POA: Diagnosis not present

## 2023-06-18 DIAGNOSIS — Z9181 History of falling: Secondary | ICD-10-CM | POA: Diagnosis not present

## 2023-06-19 DIAGNOSIS — Z9181 History of falling: Secondary | ICD-10-CM | POA: Diagnosis not present

## 2023-06-19 DIAGNOSIS — M6281 Muscle weakness (generalized): Secondary | ICD-10-CM | POA: Diagnosis not present

## 2023-06-19 DIAGNOSIS — R293 Abnormal posture: Secondary | ICD-10-CM | POA: Diagnosis not present

## 2023-06-20 DIAGNOSIS — J9611 Chronic respiratory failure with hypoxia: Secondary | ICD-10-CM | POA: Diagnosis not present

## 2023-06-20 DIAGNOSIS — R059 Cough, unspecified: Secondary | ICD-10-CM | POA: Diagnosis not present

## 2023-06-20 DIAGNOSIS — M6281 Muscle weakness (generalized): Secondary | ICD-10-CM | POA: Diagnosis not present

## 2023-06-20 DIAGNOSIS — R293 Abnormal posture: Secondary | ICD-10-CM | POA: Diagnosis not present

## 2023-06-20 DIAGNOSIS — Z9181 History of falling: Secondary | ICD-10-CM | POA: Diagnosis not present

## 2023-06-22 DIAGNOSIS — R059 Cough, unspecified: Secondary | ICD-10-CM | POA: Diagnosis not present

## 2023-06-22 DIAGNOSIS — J189 Pneumonia, unspecified organism: Secondary | ICD-10-CM | POA: Diagnosis not present

## 2023-06-23 DIAGNOSIS — J189 Pneumonia, unspecified organism: Secondary | ICD-10-CM | POA: Diagnosis not present

## 2023-06-23 DIAGNOSIS — R293 Abnormal posture: Secondary | ICD-10-CM | POA: Diagnosis not present

## 2023-06-23 DIAGNOSIS — J9611 Chronic respiratory failure with hypoxia: Secondary | ICD-10-CM | POA: Diagnosis not present

## 2023-06-23 DIAGNOSIS — S32000D Wedge compression fracture of unspecified lumbar vertebra, subsequent encounter for fracture with routine healing: Secondary | ICD-10-CM | POA: Diagnosis not present

## 2023-06-23 DIAGNOSIS — N189 Chronic kidney disease, unspecified: Secondary | ICD-10-CM | POA: Diagnosis not present

## 2023-06-23 DIAGNOSIS — Z9181 History of falling: Secondary | ICD-10-CM | POA: Diagnosis not present

## 2023-06-23 DIAGNOSIS — M6281 Muscle weakness (generalized): Secondary | ICD-10-CM | POA: Diagnosis not present

## 2023-06-25 DIAGNOSIS — Z9181 History of falling: Secondary | ICD-10-CM | POA: Diagnosis not present

## 2023-06-25 DIAGNOSIS — R293 Abnormal posture: Secondary | ICD-10-CM | POA: Diagnosis not present

## 2023-06-25 DIAGNOSIS — M6281 Muscle weakness (generalized): Secondary | ICD-10-CM | POA: Diagnosis not present

## 2023-07-01 DIAGNOSIS — M6281 Muscle weakness (generalized): Secondary | ICD-10-CM | POA: Diagnosis not present

## 2023-07-01 DIAGNOSIS — S91101A Unspecified open wound of right great toe without damage to nail, initial encounter: Secondary | ICD-10-CM | POA: Diagnosis not present

## 2023-07-01 DIAGNOSIS — R293 Abnormal posture: Secondary | ICD-10-CM | POA: Diagnosis not present

## 2023-07-01 DIAGNOSIS — Z9181 History of falling: Secondary | ICD-10-CM | POA: Diagnosis not present

## 2023-07-02 DIAGNOSIS — M6281 Muscle weakness (generalized): Secondary | ICD-10-CM | POA: Diagnosis not present

## 2023-07-02 DIAGNOSIS — Z9181 History of falling: Secondary | ICD-10-CM | POA: Diagnosis not present

## 2023-07-02 DIAGNOSIS — R293 Abnormal posture: Secondary | ICD-10-CM | POA: Diagnosis not present

## 2023-07-03 DIAGNOSIS — N189 Chronic kidney disease, unspecified: Secondary | ICD-10-CM | POA: Diagnosis not present

## 2023-07-03 DIAGNOSIS — S32000D Wedge compression fracture of unspecified lumbar vertebra, subsequent encounter for fracture with routine healing: Secondary | ICD-10-CM | POA: Diagnosis not present

## 2023-07-03 DIAGNOSIS — R0602 Shortness of breath: Secondary | ICD-10-CM | POA: Diagnosis not present

## 2023-07-03 DIAGNOSIS — J9611 Chronic respiratory failure with hypoxia: Secondary | ICD-10-CM | POA: Diagnosis not present

## 2023-07-03 DIAGNOSIS — J189 Pneumonia, unspecified organism: Secondary | ICD-10-CM | POA: Diagnosis not present

## 2023-07-04 DIAGNOSIS — N179 Acute kidney failure, unspecified: Secondary | ICD-10-CM | POA: Diagnosis not present

## 2023-07-04 DIAGNOSIS — N189 Chronic kidney disease, unspecified: Secondary | ICD-10-CM | POA: Diagnosis not present

## 2023-07-04 DIAGNOSIS — R627 Adult failure to thrive: Secondary | ICD-10-CM | POA: Diagnosis not present

## 2023-07-04 DIAGNOSIS — I509 Heart failure, unspecified: Secondary | ICD-10-CM | POA: Diagnosis not present

## 2023-07-05 DIAGNOSIS — N189 Chronic kidney disease, unspecified: Secondary | ICD-10-CM | POA: Diagnosis not present

## 2023-07-05 DIAGNOSIS — J9611 Chronic respiratory failure with hypoxia: Secondary | ICD-10-CM | POA: Diagnosis not present

## 2023-07-05 DIAGNOSIS — R627 Adult failure to thrive: Secondary | ICD-10-CM | POA: Diagnosis not present

## 2023-07-05 DIAGNOSIS — I509 Heart failure, unspecified: Secondary | ICD-10-CM | POA: Diagnosis not present

## 2023-07-08 DIAGNOSIS — 419620001 Death: Secondary | SNOMED CT | POA: Diagnosis not present

## 2023-07-08 DEATH — deceased
